# Patient Record
Sex: Female | Born: 1990
Health system: Southern US, Community
[De-identification: ages and names within clinical notes are randomized; demographics above are authoritative.]

## PROBLEM LIST (undated history)

## (undated) ENCOUNTER — Emergency Department (HOSPITAL_COMMUNITY): Admission: EM | Payer: 59 | Source: Home / Self Care

## (undated) ENCOUNTER — Encounter

## (undated) ENCOUNTER — Ambulatory Visit
Payer: BLUE CROSS/BLUE SHIELD | Attending: Student in an Organized Health Care Education/Training Program | Primary: Student in an Organized Health Care Education/Training Program

## (undated) ENCOUNTER — Ambulatory Visit: Payer: PRIVATE HEALTH INSURANCE

## (undated) ENCOUNTER — Ambulatory Visit

## (undated) ENCOUNTER — Encounter
Attending: Student in an Organized Health Care Education/Training Program | Primary: Student in an Organized Health Care Education/Training Program

## (undated) ENCOUNTER — Ambulatory Visit: Payer: BLUE CROSS/BLUE SHIELD

## (undated) ENCOUNTER — Telehealth

## (undated) ENCOUNTER — Ambulatory Visit: Payer: BLUE CROSS/BLUE SHIELD | Attending: Nurse Practitioner | Primary: Nurse Practitioner

## (undated) ENCOUNTER — Ambulatory Visit
Payer: PRIVATE HEALTH INSURANCE | Attending: Student in an Organized Health Care Education/Training Program | Primary: Student in an Organized Health Care Education/Training Program

## (undated) ENCOUNTER — Ambulatory Visit: Payer: PRIVATE HEALTH INSURANCE | Attending: Nurse Practitioner | Primary: Nurse Practitioner

## (undated) ENCOUNTER — Telehealth: Attending: Family Medicine | Primary: Family Medicine

## (undated) ENCOUNTER — Ambulatory Visit
Attending: Student in an Organized Health Care Education/Training Program | Primary: Student in an Organized Health Care Education/Training Program

## (undated) ENCOUNTER — Ambulatory Visit: Attending: Pharmacist | Primary: Pharmacist

## (undated) ENCOUNTER — Encounter: Attending: Nurse Practitioner | Primary: Nurse Practitioner

## (undated) DIAGNOSIS — R011 Cardiac murmur, unspecified: Secondary | ICD-10-CM

## (undated) DIAGNOSIS — J3501 Chronic tonsillitis: Principal | ICD-10-CM

## (undated) DIAGNOSIS — F1911 Other psychoactive substance abuse, in remission: Secondary | ICD-10-CM

## (undated) DIAGNOSIS — F319 Bipolar disorder, unspecified: Secondary | ICD-10-CM

## (undated) DIAGNOSIS — Z789 Other specified health status: Secondary | ICD-10-CM

## (undated) DIAGNOSIS — F909 Attention-deficit hyperactivity disorder, unspecified type: Secondary | ICD-10-CM

## (undated) DIAGNOSIS — R7303 Prediabetes: Secondary | ICD-10-CM

## (undated) DIAGNOSIS — Z8489 Family history of other specified conditions: Secondary | ICD-10-CM

## (undated) DIAGNOSIS — H9012 Conductive hearing loss, unilateral, left ear, with unrestricted hearing on the contralateral side: Secondary | ICD-10-CM

## (undated) DIAGNOSIS — L732 Hidradenitis suppurativa: Secondary | ICD-10-CM

## (undated) DIAGNOSIS — G43909 Migraine, unspecified, not intractable, without status migrainosus: Secondary | ICD-10-CM

## (undated) HISTORY — PX: TYMPANOPLASTY: SHX33

## (undated) HISTORY — PX: WISDOM TOOTH EXTRACTION: SHX21

## (undated) HISTORY — DX: Attention-deficit hyperactivity disorder, unspecified type: F90.9

## (undated) HISTORY — PX: AXILLARY HIDRADENITIS EXCISION: SUR522

## (undated) HISTORY — PX: TONSILLECTOMY: SUR1361

---
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
## 2012-02-11 ENCOUNTER — Ambulatory Visit (INDEPENDENT_AMBULATORY_CARE_PROVIDER_SITE_OTHER): Payer: 59 | Admitting: Family Medicine

## 2012-02-11 ENCOUNTER — Encounter: Payer: Self-pay | Admitting: Family Medicine

## 2012-02-11 VITALS — BP 104/80 | HR 80 | Temp 98.4°F | Ht 61.0 in | Wt 131.0 lb

## 2012-02-11 DIAGNOSIS — F909 Attention-deficit hyperactivity disorder, unspecified type: Secondary | ICD-10-CM | POA: Insufficient documentation

## 2012-02-11 DIAGNOSIS — Z136 Encounter for screening for cardiovascular disorders: Secondary | ICD-10-CM

## 2012-02-11 DIAGNOSIS — F901 Attention-deficit hyperactivity disorder, predominantly hyperactive type: Secondary | ICD-10-CM | POA: Insufficient documentation

## 2012-02-11 DIAGNOSIS — Z0000 Encounter for general adult medical examination without abnormal findings: Secondary | ICD-10-CM

## 2012-02-11 LAB — LIPID PANEL
Cholesterol: 172 mg/dL (ref 0–200)
LDL Cholesterol: 115 mg/dL — ABNORMAL HIGH (ref 0–99)
Total CHOL/HDL Ratio: 4

## 2012-02-11 LAB — COMPREHENSIVE METABOLIC PANEL
AST: 17 U/L (ref 0–37)
Albumin: 4.1 g/dL (ref 3.5–5.2)
Alkaline Phosphatase: 90 U/L (ref 39–117)
BUN: 9 mg/dL (ref 6–23)
Calcium: 9.4 mg/dL (ref 8.4–10.5)
Chloride: 105 mEq/L (ref 96–112)
Glucose, Bld: 95 mg/dL (ref 70–99)
Potassium: 4.1 mEq/L (ref 3.5–5.1)
Sodium: 139 mEq/L (ref 135–145)
Total Protein: 7.8 g/dL (ref 6.0–8.3)

## 2012-02-11 MED ORDER — METHYLPHENIDATE 30 MG/9HR TD PTCH: MEDICATED_PATCH | TRANSDERMAL | Status: DC

## 2012-02-11 NOTE — Progress Notes (Signed)
Subjective:    Patient ID: Courtney [name], female    DOB: 11/02/1990, 21 y.o.   MRN: [id number]  HPI  22 yo G0 here with her mother to establish care.  Sees Dr. Dareen Piano, OBGYN- received Depo injections through him.  She now does not typically have a period and is ok with this.  Not currently sexually active but has been having sex since she was 21 years old.  Uses condoms when she is sexually active.  ADHD- diagnosed at 21 years of age through formal psych evaluation.  Currently uses Daytrana patch twice daily.  Symptoms currently well controlled.  She is attending community college and living with her mother and grandmother.  Has no complaints today.  Patient Active Problem List  Diagnosis  . ANKLE SPRAIN, RIGHT  . ADHD (attention deficit hyperactivity disorder)  . Routine general medical examination at a health care facility   Past Medical History  Diagnosis Date  . ADHD (attention deficit hyperactivity disorder)     diagnosed with formal evaluation at 21 years of age  . Allergy    Past Surgical History  Procedure Date  . Bilateral tm reconstruction    History  Substance Use Topics  . Smoking status: Never Smoker   . Smokeless tobacco: Not on file  . Alcohol Use: Not on file   Family History  Problem Relation Age of Onset  . Hypertension Mother   . Hyperlipidemia Mother    No Known Allergies Current Outpatient Prescriptions on File Prior to Visit  Medication Sig Dispense Refill  . medroxyPROGESTERone (DEPO-PROVERA) 150 MG/ML injection Inject 150 mg into the muscle every 3 (three) months.      . methylphenidate (DAYTRANA) 30 MG/9HR Wear two patchs for 9 hours only each day  180 patch  0  . montelukast (SINGULAIR) 10 MG tablet Take 10 mg by mouth at bedtime.       The PMH, PSH, Social History, Family History, Medications, and allergies have been reviewed in [facility], and have been updated if relevant.    Review of Systems See HPI Patient reports no  vision/ hearing  changes,anorexia, weight change, fever ,adenopathy, persistant / recurrent hoarseness, swallowing issues, chest pain, edema,persistant / recurrent cough, hemoptysis, dyspnea(rest, exertional, paroxysmal nocturnal), gastrointestinal  bleeding (melena, rectal bleeding), abdominal pain, excessive heart burn, GU symptoms(dysuria, hematuria, pyuria, voiding/incontinence  Issues) syncope, focal weakness, severe memory loss, concerning skin lesions, depression, anxiety, abnormal bruising/bleeding, major joint swelling, breast masses or abnormal vaginal bleeding.       Objective:   Physical Exam BP 104/80  Pulse 80  Temp 98.4 F (36.9 C)  Ht 5\' 1"  (1.549 m)  Wt 131 lb (59.421 kg)  BMI 24.75 kg/m2  General:  Well-developed,well-nourished,in no acute distress; alert,appropriate and cooperative throughout examination Head:  normocephalic and atraumatic.   Eyes:  vision grossly intact, pupils equal, pupils round, and pupils reactive to light.   Ears:  R ear normal and L ear normal.   Nose:  no external deformity.   Mouth:  good dentition.   Neck:  No deformities, masses, or tenderness noted. Lungs:  Normal respiratory effort, chest expands symmetrically. Lungs are clear to auscultation, no crackles or wheezes. Heart:  Normal rate and regular rhythm. S1 and S2 normal without gallop, murmur, click, rub or other extra sounds. Abdomen:  Bowel sounds positive,abdomen soft and non-tender without masses, organomegaly or hernias noted. Msk:  No deformity or scoliosis noted of thoracic or lumbar spine.   Extremities:  No clubbing, cyanosis,  edema, or deformity noted with normal full range of motion of all joints.   Neurologic:  alert & oriented X3 and gait normal.   Skin:  Intact without suspicious lesions or rashes Bilateral ear piercing, naval piercing Cervical Nodes:  No lymphadenopathy noted Axillary Nodes:  No palpable lymphadenopathy Psych:  Cognition and judgment appear intact. Alert and cooperative  with normal attention span and concentration. No apparent delusions, illusions, hallucinations        Assessment & Plan:   1. ADHD (attention deficit hyperactivity disorder)  Stable on daytrana.  Refilled today- awaiting records.   2. Screening for ischemic heart disease  Lipid Panel  3. Routine general medical examination at a health care facility  Reviewed preventive care protocols, scheduled due services, and updated immunizations Discussed nutrition, exercise, diet, and healthy lifestyle.  Comprehensive metabolic panel

## 2012-02-11 NOTE — Patient Instructions (Addendum)
It was wonderful to meet you. Have a wonderful Christmas.  We will call you with your lab results.  Health Maintenance, Females A healthy lifestyle and preventative care can promote health and wellness.  Maintain regular health, dental, and eye exams.  Eat a healthy diet. Foods like vegetables, fruits, whole grains, low-fat dairy products, and lean protein foods contain the nutrients you need without too many calories. Decrease your intake of foods high in solid fats, added sugars, and salt. Get information about a proper diet from your caregiver, if necessary.  Regular physical exercise is one of the most important things you can do for your health. Most adults should get at least 150 minutes of moderate-intensity exercise (any activity that increases your heart rate and causes you to sweat) each week. In addition, most adults need muscle-strengthening exercises on 2 or more days a week.   Maintain a healthy weight. The body mass index (BMI) is a screening tool to identify possible weight problems. It provides an estimate of body fat based on height and weight. Your caregiver can help determine your BMI, and can help you achieve or maintain a healthy weight. For adults 20 years and older:  A BMI below 18.5 is considered underweight.  A BMI of 18.5 to 24.9 is normal.  A BMI of 25 to 29.9 is considered overweight.  A BMI of 30 and above is considered obese.  Maintain normal blood lipids and cholesterol by exercising and minimizing your intake of saturated fat. Eat a balanced diet with plenty of fruits and vegetables. Blood tests for lipids and cholesterol should begin at age 43 and be repeated every 5 years. If your lipid or cholesterol levels are high, you are over 50, or you are a high risk for heart disease, you may need your cholesterol levels checked more frequently.Ongoing high lipid and cholesterol levels should be treated with medicines if diet and exercise are not effective.  If  you smoke, find out from your caregiver how to quit. If you do not use tobacco, do not start.  If you are pregnant, do not drink alcohol. If you are breastfeeding, be very cautious about drinking alcohol. If you are not pregnant and choose to drink alcohol, do not exceed 1 drink per day. One drink is considered to be 12 ounces (355 mL) of beer, 5 ounces (148 mL) of wine, or 1.5 ounces (44 mL) of liquor.  Avoid use of street drugs. Do not share needles with anyone. Ask for help if you need support or instructions about stopping the use of drugs.  Diabetes screening involves taking a blood sample to check your fasting blood sugar level. This should be done once every 3 years, after age 46, if you are within normal weight and without risk factors for diabetes. Testing should be considered at a younger age or be carried out more frequently if you are overweight and have at least 1 risk factor for diabetes.  Breast cancer screening is essential preventative care for women. You should practice "breast self-awareness." This means understanding the normal appearance and feel of your breasts and may include breast self-examination. Any changes detected, no matter how small, should be reported to a caregiver. Women in their 6s and 30s should have a clinical breast exam (CBE) by a caregiver as part of a regular health exam every 1 to 3 years. After age 54, women should have a CBE every year. Starting at age 49, women should consider having a mammogram (breast X-ray)  every year. Women who have a family history of breast cancer should talk to their caregiver about genetic screening. Women at a high risk of breast cancer should talk to their caregiver about having an MRI and a mammogram every year.  The Pap test is a screening test for cervical cancer. Women should have a Pap test starting at age 12. Between ages 80 and 78, Pap tests should be repeated every 2 years. Beginning at age 68, you should have a Pap test  every 3 years as long as the past 3 Pap tests have been normal. If you had a hysterectomy for a problem that was not cancer or a condition that could lead to cancer, then you no longer need Pap tests. If you are between ages 8 and 37, and you have had normal Pap tests going back 10 years, you no longer need Pap tests. If you have had past treatment for cervical cancer or a condition that could lead to cancer, you need Pap tests and screening for cancer for at least 20 years after your treatment. If Pap tests have been discontinued, risk factors (such as a new sexual partner) need to be reassessed to determine if screening should be resumed. Some women have medical problems that increase the chance of getting cervical cancer. In these cases, your caregiver may recommend more frequent screening and Pap tests.  The human papillomavirus (HPV) test is an additional test that may be used for cervical cancer screening. The HPV test looks for the virus that can cause the cell changes on the cervix. The cells collected during the Pap test can be tested for HPV. The HPV test could be used to screen women aged 46 years and older, and should be used in women of any age who have unclear Pap test results. After the age of 67, women should have HPV testing at the same frequency as a Pap test.  Practice safe sex. Use condoms and avoid high-risk sexual practices to reduce the spread of sexually transmitted infections (STIs). Sexually active women aged 67 and younger should be checked for Chlamydia, which is a common sexually transmitted infection. Older women with new or multiple partners should also be tested for Chlamydia. Testing for other STIs is recommended if you are sexually active and at increased risk.  Use sunscreen with a sun protection factor (SPF) of 30 or greater. Apply sunscreen liberally and repeatedly throughout the day. You should seek shade when your shadow is shorter than you. Protect yourself by wearing  long sleeves, pants, a wide-brimmed hat, and sunglasses year round, whenever you are outdoors.  Notify your caregiver of new moles or changes in moles, especially if there is a change in shape or color. Also notify your caregiver if a mole is larger than the size of a pencil eraser.  Stay current with your immunizations. Document Released: 09/04/2010 Document Revised: 05/14/2011 Document Reviewed: 09/04/2010 [facility] Patient Information 2013 [place], [place].

## 2012-04-04 ENCOUNTER — Encounter: Payer: Self-pay | Admitting: Family Medicine

## 2012-04-04 ENCOUNTER — Ambulatory Visit (INDEPENDENT_AMBULATORY_CARE_PROVIDER_SITE_OTHER): Payer: 59 | Admitting: Family Medicine

## 2012-04-04 VITALS — BP 102/78 | HR 90 | Temp 97.8°F | Wt 134.0 lb

## 2012-04-04 DIAGNOSIS — J029 Acute pharyngitis, unspecified: Secondary | ICD-10-CM

## 2012-04-04 DIAGNOSIS — J069 Acute upper respiratory infection, unspecified: Secondary | ICD-10-CM | POA: Insufficient documentation

## 2012-04-04 MED ORDER — FLUTICASONE PROPIONATE 50 MCG/ACT NA SUSP: 2.0000 | Freq: Every day | NASAL | Status: DC

## 2012-04-04 NOTE — Progress Notes (Signed)
Sx started about 4 days ago.  Felt hot initially and didn't feel well.  Hoarse voice.  No vomiting, no nausea.  No diarrhea.  Is sneezing, stuffy, coughing.  R sided HA.  No ear pain. ST noted, variable intensity. Glands feel sore in neck.  No sputum.  Frequent rhinorrhea, clear.  Mild aches.  In school at [employer].   She is most bothered by the HA and nasal sx.    Meds, vitals, and allergies reviewed.   ROS: See HPI.  Otherwise, noncontributory.  GEN: nad, alert and oriented HEENT: mucous membranes moist, tm w/o erythema but chronic changes noted, nasal exam w/o erythema, clear discharge noted,  OP with cobblestoning NECK: supple w/o LA CV: rrr.   PULM: ctab, no inc wob EXT: no edema SKIN: no acute rash

## 2012-04-04 NOTE — Assessment & Plan Note (Signed)
Nontoxic, short duration.  Would use nasal saline and add on flonase.  Supportive tx o/w and f/u prn.  RST neg, likely viral.

## 2012-04-04 NOTE — Patient Instructions (Addendum)
Drink plenty of fluids, take tylenol as needed, and gargle with warm salt water for your throat.  Use nasal saline and then the flonase for nasal congestion.  This should gradually improve.  Take care.  Let us know if you have other concerns.

## 2012-05-20 ENCOUNTER — Encounter: Payer: Self-pay | Admitting: Family Medicine

## 2012-05-20 ENCOUNTER — Ambulatory Visit (INDEPENDENT_AMBULATORY_CARE_PROVIDER_SITE_OTHER): Payer: 59 | Admitting: Family Medicine

## 2012-05-20 VITALS — BP 116/78 | HR 96 | Temp 98.4°F

## 2012-05-20 DIAGNOSIS — R109 Unspecified abdominal pain: Secondary | ICD-10-CM

## 2012-05-20 LAB — POCT URINALYSIS DIPSTICK
Glucose, UA: NEGATIVE
Protein, UA: NEGATIVE
Urobilinogen, UA: 0.2

## 2012-05-20 LAB — POCT URINE PREGNANCY: Preg Test, Ur: NEGATIVE

## 2012-05-20 MED ORDER — NAPROXEN 500 MG PO TABS: ORAL_TABLET | ORAL | Status: DC

## 2012-05-20 NOTE — Progress Notes (Signed)
  Subjective:    Patient ID: Courtney [name], female    DOB: 1991/01/18, 22 y.o.   MRN: [id number]  HPI CC: R back pain  1d h/o sharp stabbing pain in R lateral abdomen.  Started yesterday morning.  Worse today.  Positional - laying down improves pain.  Bending over acutely worsens pain.  Felt cold yesterday.  Did eat fajita Sunday night - possibly more greasy than normal.  Denies inciting injury, no new workout routine. No new skin rashes. No fevers/chills, nausea/vomiting, diarrhea/constipation, dysuria, urgency, frequency, hematuria. No h/o prior pain like this. Appetite normal.  So far has tried tylenol which didn't help.  Also tried 2 tums, didn't help.  Both parents with kidney stones. Denies EtOH use. On depo shot.  Past Medical History  Diagnosis Date  . ADHD (attention deficit hyperactivity disorder)     diagnosed with formal evaluation at 22 years of age  . Allergy     Review of Systems Per HPI    Objective:   Physical Exam  Nursing note and vitals reviewed. Constitutional: She appears well-developed and well-nourished. No distress.  HENT:  Head: Normocephalic and atraumatic.  Mouth/Throat: Oropharynx is clear and moist. No oropharyngeal exudate.  Eyes: Conjunctivae and EOM are normal. Pupils are equal, round, and reactive to light. No scleral icterus.  Cardiovascular: Normal rate, regular rhythm, normal heart sounds and intact distal pulses.   No murmur heard. Pulmonary/Chest: Effort normal and breath sounds normal. No respiratory distress. She has no wheezes. She has no rales. She exhibits no tenderness.  Abdominal: Soft. Normal appearance and bowel sounds are normal. She exhibits no distension and no mass. There is no hepatosplenomegaly. There is tenderness in the right upper quadrant and right lower quadrant. There is guarding. There is no rigidity, no rebound, no CVA tenderness and negative Murphy's sign.  Neg psoas test  Musculoskeletal: She exhibits no edema.   Skin: Skin is warm and dry. No rash noted.  Psychiatric: She has a normal mood and affect.       Assessment & Plan:

## 2012-05-20 NOTE — Assessment & Plan Note (Addendum)
Anticipate gallbladder pathology given location of pain, ?gallstones. Not consistent with nephrolithiasis or appendicitis.  Today nontoxic. Check CBC, CMP today. UA today - sm LE and trace blood - micro WNL.  Regardless sent culture, but doubt urinary source of pain. Upreg neg. Schedule abd [place] to further eval gallbladder. Treat pain with naprosyn, rest. Red flags to seek urgent care or ER overnight discussed.

## 2012-05-20 NOTE — Patient Instructions (Signed)
I think this is more gallbladder issue - pass by Marion's office to schedule ultrasound. Urine checked today - normal. Blood work today as well. Start anti inflammatory for discomfort. If worsening pain at all, please seek urgent care or go to ER.

## 2012-05-21 ENCOUNTER — Ambulatory Visit: Payer: Self-pay | Admitting: Family Medicine

## 2012-05-21 ENCOUNTER — Encounter: Payer: Self-pay | Admitting: Family Medicine

## 2012-05-21 LAB — CBC WITH DIFFERENTIAL/PLATELET
Basophils Absolute: 0.2 10*3/uL — ABNORMAL HIGH (ref 0.0–0.1)
Eosinophils Absolute: 0.1 10*3/uL (ref 0.0–0.7)
Eosinophils Relative: 0.7 % (ref 0.0–5.0)
HCT: 42.7 % (ref 36.0–46.0)
Lymphs Abs: 2.1 10*3/uL (ref 0.7–4.0)
MCHC: 33.9 g/dL (ref 30.0–36.0)
MCV: 91.9 fl (ref 78.0–100.0)
Monocytes Absolute: 0.4 10*3/uL (ref 0.1–1.0)
Platelets: 303 10*3/uL (ref 150.0–400.0)
RDW: 13.2 % (ref 11.5–14.6)

## 2012-05-21 LAB — COMPREHENSIVE METABOLIC PANEL
ALT: 16 U/L (ref 0–35)
AST: 21 U/L (ref 0–37)
Albumin: 4.4 g/dL (ref 3.5–5.2)
Alkaline Phosphatase: 88 U/L (ref 39–117)
Potassium: 4.2 mEq/L (ref 3.5–5.1)
Sodium: 138 mEq/L (ref 135–145)
Total Bilirubin: 0.8 mg/dL (ref 0.3–1.2)
Total Protein: 8.2 g/dL (ref 6.0–8.3)

## 2012-05-22 ENCOUNTER — Other Ambulatory Visit: Payer: Self-pay | Admitting: Family Medicine

## 2012-05-22 MED ORDER — METHYLPHENIDATE 30 MG/9HR TD PTCH: MEDICATED_PATCH | TRANSDERMAL | Status: DC

## 2012-08-18 ENCOUNTER — Ambulatory Visit: Payer: 59 | Admitting: Family Medicine

## 2012-08-21 ENCOUNTER — Ambulatory Visit (INDEPENDENT_AMBULATORY_CARE_PROVIDER_SITE_OTHER): Payer: 59 | Admitting: Family Medicine

## 2012-08-21 ENCOUNTER — Encounter: Payer: Self-pay | Admitting: Family Medicine

## 2012-08-21 VITALS — BP 108/78 | HR 72 | Temp 98.2°F | Wt 147.0 lb

## 2012-08-21 DIAGNOSIS — J3489 Other specified disorders of nose and nasal sinuses: Secondary | ICD-10-CM

## 2012-08-21 MED ORDER — METHYLPHENIDATE 30 MG/9HR TD PTCH: MEDICATED_PATCH | TRANSDERMAL | Status: DC

## 2012-08-21 MED ORDER — AMOXICILLIN-POT CLAVULANATE 875-125 MG PO TABS: 1.0000 | ORAL_TABLET | Freq: Two times a day (BID) | ORAL | Status: DC

## 2012-08-21 NOTE — Progress Notes (Signed)
  Subjective:    Patient ID: Courtney [name], female    DOB: January 09, 1991, 22 y.o.   MRN: [id number]  HPI  Very pleasant 22 yo here with sore area in left nostril for 5 days.  Becoming more painful and red.  No fevers or chills. No n/v/d.  Patient Active Problem List   Diagnosis Date Noted  . ADHD (attention deficit hyperactivity disorder) 02/11/2012   Past Medical History  Diagnosis Date  . ADHD (attention deficit hyperactivity disorder)     diagnosed with formal evaluation at 22 years of age  . Allergy    Past Surgical History  Procedure Laterality Date  . Bilateral tm reconstruction     History  Substance Use Topics  . Smoking status: Never Smoker   . Smokeless tobacco: Not on file  . Alcohol Use: No   Family History  Problem Relation Age of Onset  . Hypertension Mother   . Hyperlipidemia Mother    No Known Allergies Current Outpatient Prescriptions on File Prior to Visit  Medication Sig Dispense Refill  . fluticasone (FLONASE) 50 MCG/ACT nasal spray Place 2 sprays into the nose daily.  16 g  1  . medroxyPROGESTERone (DEPO-PROVERA) 150 MG/ML injection Inject 150 mg into the muscle every 3 (three) months.      . montelukast (SINGULAIR) 10 MG tablet Take 10 mg by mouth at bedtime.       No current facility-administered medications on file prior to visit.   The PMH, PSH, Social History, Family History, Medications, and allergies have been reviewed in [facility], and have been updated if relevant.   Review of Systems    See HPI No HA No epistaxis Objective:   Physical Exam  Constitutional: She appears well-developed and well-nourished. No distress.  HENT:  Head: Normocephalic.  Nose:    Psychiatric: She has a normal mood and affect. Her speech is normal and behavior is normal. Judgment and thought content normal. Cognition and memory are normal.   BP 108/78  Pulse 72  Temp(Src) 98.2 F (36.8 C)  Wt 147 lb (66.679 kg)  BMI 27.79 kg/m2        Assessment &  Plan:  1. Internal nasal lesion Now with indications of infection. Will treat with Augmentin twice daily x 10 days. Continue warm compresses. Call or return to clinic prn if these symptoms worsen or fail to improve as anticipated. The patient indicates understanding of these issues and agrees with the plan.

## 2012-08-21 NOTE — Patient Instructions (Addendum)
Good to see you. Please take Augmentin as directed- 1 tablet twice daily x 10 days.  Please talk to your about seeing a therapist.

## 2012-08-22 ENCOUNTER — Encounter: Payer: Self-pay | Admitting: Family Medicine

## 2012-10-01 ENCOUNTER — Ambulatory Visit (INDEPENDENT_AMBULATORY_CARE_PROVIDER_SITE_OTHER): Payer: 59 | Admitting: Family Medicine

## 2012-10-01 ENCOUNTER — Encounter: Payer: Self-pay | Admitting: Family Medicine

## 2012-10-01 VITALS — BP 118/76 | HR 88 | Temp 99.2°F | Ht 61.0 in | Wt 147.0 lb

## 2012-10-01 DIAGNOSIS — L559 Sunburn, unspecified: Secondary | ICD-10-CM

## 2012-10-01 NOTE — Patient Instructions (Signed)
Sunburn  Sunburn is damage to the skin caused by overexposure to ultraviolet (UV) rays. People with light skin or a fair complexion may be more susceptible to sunburn. Repeated sun exposure causes early skin aging such as wrinkles and sun spots. It also increases the risk of skin cancer.  CAUSES  A sunburn is caused by getting too much UV radiation from the sun.  SYMPTOMS   Red or pink skin.   Soreness and swelling.   Pain.   Blisters.   Peeling skin.   Headache, fever, and fatigue if sunburn covers a large area.  TREATMENT   Your caregiver may tell you to take certain medicines to lessen inflammation.   Your caregiver may have you use hydrocortisone cream or spray to help with itching and inflammation.   Your caregiver may prescribe an antibiotic cream to use on blisters.  HOME CARE INSTRUCTIONS    Avoid further exposure to the sun.   Cool baths and cool compresses may be helpful if used several times per day. Do not apply ice, since this may result in more damage to the skin.   Only take over-the-counter or prescription medicines for pain, discomfort, or fever as directed by your caregiver.   Use aloe or other over-the-counter sunburn creams or gels on your skin. Do not apply these creams or gels on blisters.   Drink enough fluids to keep your urine clear or pale yellow.   Do not break blisters. If blisters break, your caregiver may recommend an antibiotic cream to apply to the affected area.  PREVENTION    Try to avoid the sun between 10:00 a.m. and 4:00 p.m. when it is the strongest.   Use a sunscreen or sunblock with SPF 30 or greater.   Apply sunscreen at least 30 minutes before exposure to the sun.   Always wear protective hats, clothing, and sunglasses with UV protection.   Avoid medicines, herbs, and foods that increase your sensitivity to sunlight.   Avoid tanning beds.  SEEK IMMEDIATE MEDICAL CARE IF:    You have a fever.   Your pain is uncontrolled with medicine.   You start to  vomit or have diarrhea.   You feel faint or develop a headache with confusion.   You develop severe blistering.   You have a pus-like (purulent) discharge coming from the blisters.   Your burn becomes more painful and swollen.  MAKE SURE YOU:   Understand these instructions.   Will watch your condition.   Will get help right away if you are not doing well or get worse.  Document Released: 11/29/2004 Document Revised: 05/14/2011 Document Reviewed: 08/13/2010  [employer] Patient Information 2014 [employer].

## 2012-10-01 NOTE — Progress Notes (Signed)
  Subjective:    Patient ID: Courtney [name], female    DOB: 1990-12-17, 22 y.o.   MRN: [id number]  HPI CC: sunburn.  Monday went to outdoor concern without sunscreen.  All day exposed to sun.  Bad sunburn on shoulders, face and neck.  Yesterday morning blister started.  Has been using tylenol for pain  Has been using lotion and aloe on sunburn.  Feels tired. No nausea, fevers.  Past Medical History  Diagnosis Date  . ADHD (attention deficit hyperactivity disorder)     diagnosed with formal evaluation at 22 years of age  . Allergy      Review of Systems Per HPI    Objective:   Physical Exam  Nursing note and vitals reviewed. Constitutional: She appears well-developed and well-nourished. No distress.  Skin: There is erythema.  Erythematous sunburn on both shoulders, and around neck posterior and laterally Large serous fluid filled blister on right shoulder, smaller one on R neck.       Assessment & Plan:  Area cleaned with alcohol and then 18 g needle and 10cc syringe used to drain about 2cc of serosanguinous fluid.  Pt feels better after large blister was drained.  Smaller blister left intact.  Wound care instructions provided.

## 2012-10-01 NOTE — Assessment & Plan Note (Signed)
Large blister drained. Sunburn care discussed. recommended aloe gel Discussed red flags to monitor for infection. No evidence of sun poisoning.

## 2012-11-27 ENCOUNTER — Other Ambulatory Visit: Payer: Self-pay

## 2012-11-27 MED ORDER — METHYLPHENIDATE 30 MG/9HR TD PTCH: MEDICATED_PATCH | TRANSDERMAL | Status: DC

## 2012-11-27 NOTE — Telephone Encounter (Signed)
Adela Lank notified Rx is ready to be picked up at front desk.

## 2012-11-27 NOTE — Telephone Encounter (Signed)
Courtney [name] pts mother left v/m requesting rx Daytrana; request to pick up 11/28/12 in AM. Courtney [name] request cb when ready for pick up.(Dr Dayton Martes not in office until 11/28/12 PM).

## 2012-12-29 ENCOUNTER — Telehealth: Payer: Self-pay | Admitting: *Deleted

## 2012-12-29 NOTE — Telephone Encounter (Signed)
Requested prior auth from [employer] for Daytrana. Med was approved from 11/20/2012-12/20/2015. Pharmacy notified of approval via fax. Prior auth paperwork will be sent to scan into pt's medical record.

## 2013-01-08 ENCOUNTER — Other Ambulatory Visit: Payer: Self-pay

## 2013-03-12 ENCOUNTER — Other Ambulatory Visit: Payer: Self-pay

## 2013-03-12 MED ORDER — METHYLPHENIDATE 30 MG/9HR TD PTCH: MEDICATED_PATCH | TRANSDERMAL | Status: DC

## 2013-03-12 NOTE — Telephone Encounter (Signed)
pts mother left v/m requesting rx Daytrana. Call when ready for pick up.

## 2013-03-13 NOTE — Telephone Encounter (Signed)
Durenda Ageristan [facility] [address] left [facility] and [facility] cannot [facility]. Pt must bring prescription to pharmacy.

## 2013-03-13 NOTE — Telephone Encounter (Signed)
Spoke to pt and informed her Rx has been printed and is available for pickup at the front desk;pt informed govt issued photo id required for pickup

## 2013-06-18 ENCOUNTER — Other Ambulatory Visit: Payer: Self-pay

## 2013-06-18 MED ORDER — METHYLPHENIDATE 30 MG/9HR TD PTCH: MEDICATED_PATCH | TRANSDERMAL | Status: DC

## 2013-06-18 NOTE — Telephone Encounter (Signed)
pts mother left v/m requesting printed rx for Daytrana. Call when ready for pick up.

## 2013-06-18 NOTE — Telephone Encounter (Signed)
Lm on pts vm informing her Rx is available at the front desk for pickup;informed a gov't issued photo id required for pickup 

## 2013-06-24 ENCOUNTER — Encounter: Payer: Self-pay | Admitting: Radiology

## 2013-07-28 ENCOUNTER — Ambulatory Visit (INDEPENDENT_AMBULATORY_CARE_PROVIDER_SITE_OTHER): Payer: 59 | Admitting: Family Medicine

## 2013-07-28 VITALS — BP 116/60 | HR 76 | Temp 98.1°F | Ht 60.5 in | Wt 157.8 lb

## 2013-07-28 DIAGNOSIS — J020 Streptococcal pharyngitis: Secondary | ICD-10-CM | POA: Insufficient documentation

## 2013-07-28 NOTE — Progress Notes (Signed)
   Subjective:   Patient ID: Courtney [name], female    DOB: 07-19-1990, 23 y.o.   MRN: [id number]  Courtney [name] is a pleasant 23 y.o. year old female who presents to clinic today with Follow-up and Sore Throat  on 07/28/2013  HPI: Urgent care follow up- [facility] because of sore throat, hemoptysis. + rapid strep Placed on Amoxicillin 875 mg twice daily x 10 days. Not PCN allergic. Has had strep throat 3 times in past year. Would like to discuss tonsillectomy.  Feels throat is feeling better. Hemoptysis has resolved.  Per pt, always have hemoptysis when she has strep throat.  No fevers or chills.  Patient Active Problem List   Diagnosis Date Noted  . Strep pharyngitis 07/28/2013  . Sunburn 10/01/2012  . Internal nasal lesion 08/21/2012  . ADHD (attention deficit hyperactivity disorder) 02/11/2012   Past Medical History  Diagnosis Date  . ADHD (attention deficit hyperactivity disorder)     diagnosed with formal evaluation at 23 years of age  . Allergy    Past Surgical History  Procedure Laterality Date  . Bilateral tm reconstruction     History  Substance Use Topics  . Smoking status: Never Smoker   . Smokeless tobacco: Never Used  . Alcohol Use: No   Family History  Problem Relation Age of Onset  . Hypertension Mother   . Hyperlipidemia Mother    No Known Allergies Current Outpatient Prescriptions on File Prior to Visit  Medication Sig Dispense Refill  . fluticasone (FLONASE) 50 MCG/ACT nasal spray Place 2 sprays into the nose daily as needed.      . medroxyPROGESTERone (DEPO-PROVERA) 150 MG/ML injection Inject 150 mg into the muscle every 3 (three) months.      . methylphenidate (DAYTRANA) 30 MG/9HR Wear two patchs for 9 hours only each day  180 patch  0  . montelukast (SINGULAIR) 10 MG tablet Take 10 mg by mouth at bedtime.       No current facility-administered medications on file prior to visit.   The PMH, PSH, Social History, Family  History, Medications, and allergies have been reviewed in [facility], and have been updated if relevant.   Review of Systems No nausea or vomiting. No rashes No diarrhea    Objective:    BP 116/60  Pulse 76  Temp(Src) 98.1 F (36.7 C) (Oral)  Ht 5' 0.5" (1.537 m)  Wt 157 lb 12 oz (71.555 kg)  BMI 30.29 kg/m2  SpO2 98%   Physical Exam  Nursing note and vitals reviewed. Constitutional: She appears well-developed and well-nourished. No distress.  HENT:  Head: Normocephalic and atraumatic.  Mouth/Throat: Uvula is midline and mucous membranes are normal. Posterior oropharyngeal erythema present. No oropharyngeal exudate, posterior oropharyngeal edema or tonsillar abscesses.  Eyes: Conjunctivae are normal. Pupils are equal, round, and reactive to light.  Neck: Normal range of motion. Neck supple. No tracheal deviation present. No thyromegaly present.  Lymphadenopathy:    She has no cervical adenopathy.  Skin: Skin is warm, dry and intact.  Psychiatric: She has a normal mood and affect. Her speech is normal and behavior is normal. Judgment and thought content normal. Cognition and memory are normal.          Assessment & Plan:   Strep pharyngitis No Follow-up on file.

## 2013-07-28 NOTE — Patient Instructions (Signed)
Good to see you. Please stop by to see Shirlee Limerick on your way out to set up your ENT referral.

## 2013-07-28 NOTE — Assessment & Plan Note (Signed)
Recurrent. Discussed options- she would like to consider tonsillectomy.  She would like to see Dr. Dorma Russell.  Referral placed.

## 2013-07-28 NOTE — Progress Notes (Signed)
Pre visit review using our clinic review tool, if applicable. No additional management support is needed unless otherwise documented below in the visit note. 

## 2013-07-29 ENCOUNTER — Encounter: Payer: Self-pay | Admitting: Family Medicine

## 2013-08-03 DIAGNOSIS — J3501 Chronic tonsillitis: Secondary | ICD-10-CM

## 2013-08-03 HISTORY — DX: Chronic tonsillitis: J35.01

## 2013-09-01 ENCOUNTER — Ambulatory Visit: Payer: 59 | Admitting: Family Medicine

## 2013-09-01 ENCOUNTER — Encounter (HOSPITAL_BASED_OUTPATIENT_CLINIC_OR_DEPARTMENT_OTHER): Payer: Self-pay | Admitting: *Deleted

## 2013-09-09 NOTE — H&P (Signed)
Assessment  Recurrent streptococcal tonsillitis (034.0) (J03.00). Discussed  Recurring streptococcal tonsillitis, 2-3 times annually for the past 10 years or so. This meets the criteria for tonsillectomy. Recommend proceed with tonsillectomy. Risks and benefits were discussed in detail. A handout was provided. We will schedule at their convenience. Reason For Visit  Courtney [name] is here today at the kind request of Ruthe Mannanron, Talia for consultation and opinion for sore throat. HPI  Recurring streptococcal tonsillitis. Last episode was last week. She occasionally has strep negative tonsillitis. She has 2-3 episodes each year since middle school. She is currently in college but offer the summer time. Allergies  No Known Drug Allergies. Current Meds  Singulair TABS (Montelukast Sodium);; RPT Amoxicillin 875 MG Oral Tablet;; RPT Flonase 50 MCG/ACT Nasal Suspension (Fluticasone Propionate);; RPT MedroxyPROGESTERone Acetate 150 MG/ML Intramuscular Suspension;; RPT Daytrana 30 MG/9HR Transdermal Patch;; RPT. PSH  Ear Surgery. Family Hx  Family history of Alzheimer's disease: Grandmother (V17.2) (Z82.0) Family history of diabetes mellitus: Grandmother (V18.0) (Z83.3) Seasonal allergies: Mother (J30.2). Personal Hx  Caffeine use (V49.89) (Z78.9); 5 cups day Never smoker No alcohol use Non-smoker (V49.89) (Z78.9). ROS  Systemic: Feeling tired (fatigue).  No fever, no night sweats, and no recent weight loss. Head: No headache. Eyes: No eye symptoms. Otolaryngeal: No hearing loss, no earache, no tinnitus, and no purulent nasal discharge.  No nasal passage blockage (stuffiness).  Snoring, sneezing, hoarseness, and sore throat. Cardiovascular: No chest pain or discomfort  and no palpitations. Pulmonary: No dyspnea, no cough, and no wheezing. Gastrointestinal: Dysphagia.  No heartburn.  No nausea, no abdominal pain, and no melena.  No diarrhea. Genitourinary: No dysuria. Endocrine: No muscle  weakness. Musculoskeletal: No calf muscle cramps, no arthralgias, and no soft tissue swelling. Neurological: No dizziness, no fainting, no tingling, and no numbness. Psychological: No anxiety  and no depression. Skin: No rash. 12 system ROS was obtained and reviewed on the Health Maintenance form dated today.  Positive responses are shown above.  If the symptom is not checked, the patient has denied it. Vital Signs   Recorded by [facility] LLC[facility]olimowski,Sharon on 28 Jul 2013 01:20 PM BP:110/62,  Height: 5 ft 0.5 in, Weight: 157 lb 12 oz, BMI: 30.3 kg/m2,  BMI Calculated: 30.30 ,  BSA Calculated: 1.70. Physical Exam  APPEARANCE: Well developed, heavyset young lady, in no acute distress.  Normal affect, in a pleasant mood.  Oriented to time, place and person. COMMUNICATION: Normal voice   HEAD & FACE:  No scars, lesions or masses of head and face.  Sinuses nontender to palpation.  Salivary glands without mass or tenderness.  Facial strength symmetric.  No facial lesion, scars, or mass. EYES: EOMI with normal primary gaze alignment. Visual acuity grossly intact.  PERRLA EXTERNAL EAR & NOSE: No scars, lesions or masses  EAC & TYMPANIC MEMBRANE:  EAC shows no obstructing lesions or debris and tympanic membranes are normal bilaterally with good movement to insufflation. GROSS HEARING: Normal   TMJ:  Nontender  INTRANASAL EXAM: No polyps or purulence.  NASOPHARYNX: Normal, without lesions. LIPS, TEETH & GUMS: No lip lesions, normal dentition and normal gums. ORAL CAVITY/OROPHARYNX:  Oral mucosa moist without lesion or asymmetry of the palate, tongue, tonsil or posterior pharynx. Tonsils were 2+ enlarged but clear and healthy appearing today. NECK:  Supple without adenopathy or mass. THYROID:  Normal with no masses palpable.  NEUROLOGIC:  No gross CN deficits. No nystagmus noted.   LYMPHATIC:  No enlarged nodes palpable. Signature  Electronically signed by : Enrigue CatenaJefry  Mohogany Toppins  M.D.; 07/28/2013 1:45 PM EST.

## 2013-09-14 ENCOUNTER — Encounter (HOSPITAL_BASED_OUTPATIENT_CLINIC_OR_DEPARTMENT_OTHER): Payer: Self-pay | Admitting: *Deleted

## 2013-09-14 ENCOUNTER — Encounter (HOSPITAL_BASED_OUTPATIENT_CLINIC_OR_DEPARTMENT_OTHER)
Admission: RE | Disposition: A | Discharge status: Home / Self Care | Payer: Self-pay | Source: Ambulatory Visit | Attending: Otolaryngology

## 2013-09-14 ENCOUNTER — Ambulatory Visit (HOSPITAL_BASED_OUTPATIENT_CLINIC_OR_DEPARTMENT_OTHER): Payer: 59 | Admitting: Anesthesiology

## 2013-09-14 ENCOUNTER — Encounter (HOSPITAL_BASED_OUTPATIENT_CLINIC_OR_DEPARTMENT_OTHER): Payer: 59 | Admitting: Anesthesiology

## 2013-09-14 ENCOUNTER — Ambulatory Visit (HOSPITAL_BASED_OUTPATIENT_CLINIC_OR_DEPARTMENT_OTHER)
Admission: RE | Admit: 2013-09-14 | Discharge: 2013-09-14 | Disposition: A | Discharge status: Home / Self Care | Payer: 59 | Source: Ambulatory Visit | Attending: Otolaryngology | Admitting: Otolaryngology

## 2013-09-14 DIAGNOSIS — Z792 Long term (current) use of antibiotics: Secondary | ICD-10-CM | POA: Insufficient documentation

## 2013-09-14 DIAGNOSIS — J3501 Chronic tonsillitis: Secondary | ICD-10-CM | POA: Insufficient documentation

## 2013-09-14 DIAGNOSIS — J020 Streptococcal pharyngitis: Secondary | ICD-10-CM

## 2013-09-14 DIAGNOSIS — Z9089 Acquired absence of other organs: Secondary | ICD-10-CM

## 2013-09-14 HISTORY — DX: Chronic tonsillitis: J35.01

## 2013-09-14 HISTORY — PX: TONSILLECTOMY: SHX5217

## 2013-09-14 SURGERY — TONSILLECTOMY
Anesthesia: General | Laterality: Bilateral

## 2013-09-14 MED ORDER — HYDROMORPHONE HCL PF 1 MG/ML IJ SOLN
0.2500 mg | INTRAMUSCULAR | Status: DC | PRN
  Administered 2013-09-14: 0.5 mg via INTRAVENOUS

## 2013-09-14 MED ORDER — MEDROXYPROGESTERONE ACETATE 150 MG/ML IM SUSP: 150.0000 mg | INTRAMUSCULAR | Status: DC

## 2013-09-14 MED ORDER — BACITRACIN-NEOMYCIN-POLYMYXIN 400-5-5000 EX OINT
TOPICAL_OINTMENT | CUTANEOUS | Status: AC
  Filled 2013-09-14: qty 1

## 2013-09-14 MED ORDER — LACTATED RINGERS IV SOLN
INTRAVENOUS | Status: DC
  Administered 2013-09-14 (×2): via INTRAVENOUS

## 2013-09-14 MED ORDER — PHENOL 1.4 % MT LIQD: 1.0000 | OROMUCOSAL | Status: DC | PRN

## 2013-09-14 MED ORDER — MIDAZOLAM HCL 2 MG/2ML IJ SOLN
INTRAMUSCULAR | Status: AC
  Filled 2013-09-14: qty 2

## 2013-09-14 MED ORDER — OXYCODONE HCL 5 MG/5ML PO SOLN
5.0000 mg | Freq: Once | ORAL | Status: DC | PRN
Start: 2013-09-14 — End: 2013-09-14

## 2013-09-14 MED ORDER — HYDROCODONE-ACETAMINOPHEN 7.5-325 MG/15ML PO SOLN
10.0000 mL | ORAL | Status: DC | PRN
  Administered 2013-09-14 (×2): 15 mL via ORAL
  Filled 2013-09-14 (×2): qty 15

## 2013-09-14 MED ORDER — DEXAMETHASONE SODIUM PHOSPHATE 10 MG/ML IJ SOLN: 10.0000 mg | INTRAMUSCULAR | Status: DC

## 2013-09-14 MED ORDER — OXYCODONE HCL 5 MG PO TABS: 5.0000 mg | ORAL_TABLET | Freq: Once | ORAL | Status: DC | PRN

## 2013-09-14 MED ORDER — IBUPROFEN 100 MG/5ML PO SUSP: 400.0000 mg | Freq: Four times a day (QID) | ORAL | Status: DC | PRN

## 2013-09-14 MED ORDER — SUCCINYLCHOLINE CHLORIDE 20 MG/ML IJ SOLN
INTRAMUSCULAR | Status: DC | PRN
  Administered 2013-09-14: 50 mg via INTRAVENOUS

## 2013-09-14 MED ORDER — DEXAMETHASONE SODIUM PHOSPHATE 4 MG/ML IJ SOLN
INTRAMUSCULAR | Status: DC | PRN
  Administered 2013-09-14: 10 mg via INTRAVENOUS

## 2013-09-14 MED ORDER — HYDROMORPHONE HCL PF 1 MG/ML IJ SOLN
INTRAMUSCULAR | Status: AC
  Filled 2013-09-14: qty 1

## 2013-09-14 MED ORDER — MIDAZOLAM HCL 5 MG/5ML IJ SOLN
INTRAMUSCULAR | Status: DC | PRN
  Administered 2013-09-14: 2 mg via INTRAVENOUS

## 2013-09-14 MED ORDER — MIDAZOLAM HCL 2 MG/2ML IJ SOLN: 1.0000 mg | INTRAMUSCULAR | Status: DC | PRN

## 2013-09-14 MED ORDER — ONDANSETRON HCL 4 MG/2ML IJ SOLN: 4.0000 mg | Freq: Once | INTRAMUSCULAR | Status: DC | PRN

## 2013-09-14 MED ORDER — HYDROCODONE-ACETAMINOPHEN 7.5-325 MG/15ML PO SOLN: 15.0000 mL | Freq: Four times a day (QID) | ORAL | Status: DC | PRN

## 2013-09-14 MED ORDER — FENTANYL CITRATE 0.05 MG/ML IJ SOLN
INTRAMUSCULAR | Status: AC
  Filled 2013-09-14: qty 6

## 2013-09-14 MED ORDER — FENTANYL CITRATE 0.05 MG/ML IJ SOLN: 50.0000 ug | INTRAMUSCULAR | Status: DC | PRN

## 2013-09-14 MED ORDER — PROMETHAZINE HCL 25 MG RE SUPP: 25.0000 mg | Freq: Four times a day (QID) | RECTAL | Status: DC | PRN

## 2013-09-14 MED ORDER — DEXTROSE-NACL 5-0.9 % IV SOLN
INTRAVENOUS | Status: DC
  Administered 2013-09-14: 10:00:00 via INTRAVENOUS

## 2013-09-14 MED ORDER — ONDANSETRON HCL 4 MG/2ML IJ SOLN
INTRAMUSCULAR | Status: DC | PRN
  Administered 2013-09-14: 4 mg via INTRAVENOUS

## 2013-09-14 MED ORDER — MIDAZOLAM HCL 2 MG/ML PO SYRP: 12.0000 mg | ORAL_SOLUTION | Freq: Once | ORAL | Status: DC | PRN

## 2013-09-14 MED ORDER — MONTELUKAST SODIUM 10 MG PO TABS: 10.0000 mg | ORAL_TABLET | Freq: Every day | ORAL | Status: DC

## 2013-09-14 MED ORDER — PROPOFOL 10 MG/ML IV BOLUS
INTRAVENOUS | Status: DC | PRN
  Administered 2013-09-14: 200 mg via INTRAVENOUS

## 2013-09-14 MED ORDER — FENTANYL CITRATE 0.05 MG/ML IJ SOLN
INTRAMUSCULAR | Status: DC | PRN
  Administered 2013-09-14 (×3): 50 ug via INTRAVENOUS

## 2013-09-14 MED ORDER — LIDOCAINE HCL (CARDIAC) 20 MG/ML IV SOLN
INTRAVENOUS | Status: DC | PRN
  Administered 2013-09-14: 50 mg via INTRAVENOUS

## 2013-09-14 SURGICAL SUPPLY — 29 items
CANISTER SUCT 1200ML W/VALVE (MISCELLANEOUS) ×3 IMPLANT
CATH ROBINSON RED A/P 12FR (CATHETERS) ×3 IMPLANT
COAGULATOR SUCT 6 FR SWTCH (ELECTROSURGICAL) ×1
COAGULATOR SUCT SWTCH 10FR 6 (ELECTROSURGICAL) ×2 IMPLANT
COVER MAYO STAND STRL (DRAPES) ×3 IMPLANT
ELECT COATED BLADE 2.86 ST (ELECTRODE) ×3 IMPLANT
ELECT REM PT RETURN 9FT ADLT (ELECTROSURGICAL)
ELECT REM PT RETURN 9FT PED (ELECTROSURGICAL)
ELECTRODE REM PT RETRN 9FT PED (ELECTROSURGICAL) IMPLANT
ELECTRODE REM PT RTRN 9FT ADLT (ELECTROSURGICAL) IMPLANT
GLOVE BIOGEL M 7.0 STRL (GLOVE) ×3 IMPLANT
GLOVE ECLIPSE 6.5 STRL STRAW (GLOVE) ×3 IMPLANT
GLOVE ECLIPSE 7.5 STRL STRAW (GLOVE) ×3 IMPLANT
GOWN STRL REUS W/ TWL LRG LVL3 (GOWN DISPOSABLE) ×2 IMPLANT
GOWN STRL REUS W/TWL LRG LVL3 (GOWN DISPOSABLE) ×4
MARKER SKIN DUAL TIP RULER LAB (MISCELLANEOUS) IMPLANT
NS IRRIG 1000ML POUR BTL (IV SOLUTION) ×3 IMPLANT
PENCIL FOOT CONTROL (ELECTRODE) ×3 IMPLANT
SHEET MEDIUM DRAPE 40X70 STRL (DRAPES) ×3 IMPLANT
SOLUTION BUTLER CLEAR DIP (MISCELLANEOUS) IMPLANT
SPONGE GAUZE 4X4 12PLY STER LF (GAUZE/BANDAGES/DRESSINGS) ×3 IMPLANT
SPONGE TONSIL 1 RF SGL (DISPOSABLE) IMPLANT
SPONGE TONSIL 1.25 RF SGL STRG (GAUZE/BANDAGES/DRESSINGS) IMPLANT
SYR BULB 3OZ (MISCELLANEOUS) ×3 IMPLANT
TOWEL OR 17X24 6PK STRL BLUE (TOWEL DISPOSABLE) ×3 IMPLANT
TUBE CONNECTING 20'X1/4 (TUBING) ×1
TUBE CONNECTING 20X1/4 (TUBING) ×2 IMPLANT
TUBE SALEM SUMP 12R W/ARV (TUBING) IMPLANT
TUBE SALEM SUMP 16 FR W/ARV (TUBING) IMPLANT

## 2013-09-14 NOTE — Anesthesia Preprocedure Evaluation (Signed)
Anesthesia Evaluation  Patient identified by MRN, date of birth, ID band Patient awake    Reviewed: Allergy & Precautions, H&P , NPO status , Patient's Chart, lab work & pertinent test results  Airway Mallampati: I TM Distance: >3 FB Neck ROM: Full    Dental  (+) Teeth Intact, Dental Advisory Given   Pulmonary  breath sounds clear to auscultation        Cardiovascular Rhythm:Regular Rate:Normal     Neuro/Psych    GI/Hepatic   Endo/Other    Renal/GU      Musculoskeletal   Abdominal   Peds  Hematology   Anesthesia Other Findings   Reproductive/Obstetrics                           Anesthesia Physical Anesthesia Plan  ASA: II  Anesthesia Plan: General   Post-op Pain Management:    Induction: Intravenous  Airway Management Planned: Oral ETT  Additional Equipment:   Intra-op Plan:   Post-operative Plan: Extubation in OR  Informed Consent: I have reviewed the patients History and Physical, chart, labs and discussed the procedure including the risks, benefits and alternatives for the proposed anesthesia with the patient or authorized representative who has indicated his/her understanding and acceptance.   Dental advisory given  Plan Discussed with: CRNA, Anesthesiologist and Surgeon  Anesthesia Plan Comments:        Anesthesia Quick Evaluation  

## 2013-09-14 NOTE — Transfer of Care (Signed)
Immediate Anesthesia Transfer of Care Note  Patient: Courtney ApleyBrandy L [name]  Procedure(s) Performed: Procedure(s): BILATERAL TONSILLECTOMY (Bilateral)  Patient Location: PACU  Anesthesia Type:General  Level of Consciousness: awake, alert , oriented and patient cooperative  Airway & Oxygen Therapy: Patient Spontanous Breathing, Patient connected to face mask and aerosol face mask  Post-op Assessment: Report given to PACU RN and Post -op Vital signs reviewed and stable  Post vital signs: Reviewed and stable  Complications: No apparent anesthesia complications

## 2013-09-14 NOTE — Anesthesia Postprocedure Evaluation (Signed)
  Anesthesia Post-op Note  Patient: Courtney ApleyBrandy L [name]  Procedure(s) Performed: Procedure(s): BILATERAL TONSILLECTOMY (Bilateral)  Patient Location: PACU  Anesthesia Type: General   Level of Consciousness: awake, alert  and oriented  Airway and Oxygen Therapy: Patient Spontanous Breathing  Post-op Pain: mild  Post-op Assessment: Post-op Vital signs reviewed  Post-op Vital Signs: Reviewed  Last Vitals:  Filed Vitals:   09/14/13 0900  BP: 122/83  Pulse: 67  Temp:   Resp: 12    Complications: No apparent anesthesia complications

## 2013-09-14 NOTE — Op Note (Signed)
09/14/2013  7:56 AM  PATIENT:  Courtney ApleyBrandy L Kitts  22 y.o. female  PRE-OPERATIVE DIAGNOSIS:  CHRONIC TONSILLITIS   POST-OPERATIVE DIAGNOSIS:  CHRONIC TONSILLITIS   PROCEDURE:  Procedure(s): BILATERAL TONSILLECTOMY  SURGEON:  Surgeon(s): Serena ColonelJefry Tykisha Areola, MD  ANESTHESIA:   General  COUNTS: Correct   DICTATION: The patient was taken to the operating room and placed on the operating table in the supine position. Following induction of general endotracheal anesthesia, the table was turned and the patient was draped in a standard fashion. A Crowe-Davis mouthgag was inserted into the oral cavity and used to retract the tongue and mandible, then attached to the Mayo stand.  The tonsillectomy was then performed using electrocautery dissection, carefully dissecting the avascular plane between the capsule and constrictor muscles. Cautery was used for completion of hemostasis. The tonsils were discarded. They were medium-sized but deeply cryptic.  The pharynx was irrigated with saline and suctioned. An oral gastric tube was used to aspirate the contents of the stomach. The patient was then awakened from anesthesia and transferred to PACU in stable condition.   PATIENT DISPOSITION:  To PACA, stable

## 2013-09-14 NOTE — Discharge Instructions (Signed)
Tonsillectomy, Care After °Refer to this sheet in the next few weeks. These instructions provide you with information on caring for yourself after your procedure. Your health care provider may also give you more specific instructions. Your treatment has been planned according to current medical practices, but problems sometimes occur. Call your health care provider if you have any problems or questions after your procedure. °WHAT TO EXPECT AFTER THE PROCEDURE °After your procedure, it is typical to have the following: °· Your tongue will be numb, and your sense of taste will be reduced. °· Your swallowing will be difficult and painful. °· Your jaw may hurt or make a clicking noise when you yawn or chew. °· Liquids that you drink may leak out of your nose. °· Your voice may sound muffled. °· The area at the middle of the roof of your mouth (uvula) may be very swollen. °· You may have a constant cough and need to clear mucus and phlegm from your throat. °HOME CARE INSTRUCTIONS  °· Get proper rest, keeping your head elevated at all times. °· Drink plenty of fluids. This reduces pain and hastens the healing process. °· Only take over-the-counter or prescription medicines for pain, discomfort, or fever as directed by your health care provider. Do not take aspirin or nonsteroidal anti-inflammatory drugs, such as ibuprofen, for 2 weeks after surgery. These medicines increase the possibility of bleeding. °· Soft and cold foods, such as gelatin, sherbet, ice cream, frozen ice pops, and cold drinks, are usually the easiest to eat. Several days after surgery, you will be able to eat more solid food. °· Avoid mouthwashes and gargles. °· Avoid contact with people who have upper respiratory infections, such as colds and sore throats. °SEEK MEDICAL CARE IF:  °· You have increasing pain that is not controlled with medicines. °· You have a fever. °· You have a rash. °· You feel lightheaded or faint. °· You are unable to swallow even  small amounts of liquid or saliva. °· Your urine is becoming very dark. °SEEK IMMEDIATE MEDICAL CARE IF:  °· You have difficulty breathing. °· You experience side effects or allergic reactions to medicines. °· You bleed bright red blood from your throat, or you vomit bright red blood. °MAKE SURE YOU: °· Understand these instructions. °· Will watch your condition. °· Will get help right away if you are not doing well or get worse. °Document Released: 12/21/2003 Document Revised: 02/24/2013 Document Reviewed: 09/16/2012 °Exit[employer]® Patient Information ©2015 [employer]. This information is not intended to replace advice given to you by your health care provider. Make sure you discuss any questions you have with your health care provider. ° ° °Post Anesthesia Home Care Instructions ° °Activity: °Get plenty of rest for the remainder of the day. A responsible adult should stay with you for 24 hours following the procedure.  °For the next 24 hours, DO NOT: °-Drive a car °-Operate machinery °-Drink alcoholic beverages °-Take any medication unless instructed by your physician °-Make any legal decisions or sign important papers. ° °Meals: °Start with liquid foods such as gelatin or soup. Progress to regular foods as tolerated. Avoid greasy, spicy, heavy foods. If nausea and/or vomiting occur, drink only clear liquids until the nausea and/or vomiting subsides. Call your physician if vomiting continues. ° °Special Instructions/Symptoms: °Your throat may feel dry or sore from the anesthesia or the breathing tube placed in your throat during surgery. If this causes discomfort, gargle with warm salt water. The discomfort should disappear within   24 hours. ° °

## 2013-09-14 NOTE — Anesthesia Procedure Notes (Signed)
Procedure Name: Intubation Date/Time: 09/14/2013 7:34 AM Performed by: Mertens DesanctisLINKA, [name] Pre-anesthesia Checklist: Patient identified, Emergency Drugs available, Suction available, Patient being monitored and Timeout performed Patient Re-evaluated:Patient Re-evaluated prior to inductionOxygen Delivery Method: Circle System Utilized Preoxygenation: Pre-oxygenation with 100% oxygen Intubation Type: IV induction Ventilation: Mask ventilation without difficulty Laryngoscope Size: Miller and 3 Tube type: Oral Number of attempts: 1 Airway Equipment and Method: stylet and oral airway Placement Confirmation: ETT inserted through vocal cords under direct vision,  positive ETCO2 and breath sounds checked- equal and bilateral Secured at: 20.5 cm Tube secured with: Tape Dental Injury: Teeth and Oropharynx as per pre-operative assessment

## 2013-09-14 NOTE — Interval H&P Note (Signed)
History and Physical Interval Note:  09/14/2013 7:20 AM  Courtney [name]  has presented today for surgery, with the diagnosis of CHRONIC TONSILLITIS   The various methods of treatment have been discussed with the patient and family. After consideration of risks, benefits and other options for treatment, the patient has consented to  Procedure(s): BILATERAL TONSILLECTOMY (Bilateral) as a surgical intervention .  The patient's history has been reviewed, patient examined, no change in status, stable for surgery.  I have reviewed the patient's chart and labs.  Questions were answered to the patient's satisfaction.     Darilyn Storbeck

## 2013-09-14 NOTE — OR Nursing (Signed)
4 earrings cut from ears-2 single rings removed from each ear-Sent with Patient

## 2013-09-15 ENCOUNTER — Other Ambulatory Visit: Payer: Self-pay

## 2013-09-15 ENCOUNTER — Encounter (HOSPITAL_BASED_OUTPATIENT_CLINIC_OR_DEPARTMENT_OTHER): Payer: Self-pay | Admitting: Otolaryngology

## 2013-09-15 MED ORDER — METHYLPHENIDATE 30 MG/9HR TD PTCH: MEDICATED_PATCH | TRANSDERMAL | Status: DC

## 2013-09-15 NOTE — Telephone Encounter (Signed)
Spoke to pt and informed her Rx is available for pickup.  

## 2013-09-15 NOTE — Telephone Encounter (Signed)
pts mother left v/m requesting rx Daytrana; call when ready for pick up. Call pts mother for pick up; pt just had tonsils removed on 09/14/13.

## 2013-12-22 ENCOUNTER — Ambulatory Visit (INDEPENDENT_AMBULATORY_CARE_PROVIDER_SITE_OTHER): Payer: 59 | Admitting: Family Medicine

## 2013-12-22 ENCOUNTER — Encounter: Payer: Self-pay | Admitting: Family Medicine

## 2013-12-22 VITALS — BP 124/72 | HR 87 | Temp 98.0°F | Ht 60.75 in | Wt 156.5 lb

## 2013-12-22 DIAGNOSIS — Z0289 Encounter for other administrative examinations: Secondary | ICD-10-CM

## 2013-12-22 MED ORDER — METHYLPHENIDATE 30 MG/9HR TD PTCH: MEDICATED_PATCH | TRANSDERMAL | Status: DC

## 2013-12-22 NOTE — Progress Notes (Signed)
Pre visit review using our clinic review tool, if applicable. No additional management support is needed unless otherwise documented below in the visit note. 

## 2013-12-22 NOTE — Progress Notes (Signed)
   Subjective:    Patient ID: Courtney [name], female    DOB: Jan 08, 1991, 23 y.o.   MRN: [id number]  HPI 23 yo pleasant female here to fill out form for work.  Has appt for CPX later in the year.  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  It lists tasks she may be required to do.  No other questions were asked of me.  Pt denies any CP, SOB, dizziness, or syncope with exertion.  Current Outpatient Prescriptions on File Prior to Visit  Medication Sig Dispense Refill  . medroxyPROGESTERone (DEPO-PROVERA) 150 MG/ML injection Inject 150 mg into the muscle every 3 (three) months.      . montelukast (SINGULAIR) 10 MG tablet Take 10 mg by mouth at bedtime.       No current facility-administered medications on file prior to visit.    No Known Allergies  Past Medical History  Diagnosis Date  . ADHD (attention deficit hyperactivity disorder)   . Chronic tonsillitis 08/2013    current strep, will finish antibiotic 09/04/2013; snores during sleep, mother denies apnea    Past Surgical History  Procedure Laterality Date  . Wisdom tooth extraction    . Tympanoplasty Bilateral   . Tonsillectomy Bilateral 09/14/2013    Procedure: BILATERAL TONSILLECTOMY;  Surgeon: Serena ColonelJefry Rosen, MD;  Location: [facility];  Service: ENT;  Laterality: Bilateral;    Family History  Problem Relation Age of Onset  . Hypertension Mother   . Hyperlipidemia Mother     History   Social History  . Marital Status: Single    Spouse Name: N/A    Number of Children: N/A  . Years of Education: N/A   Occupational History  . Not on file.   Social History Main Topics  . Smoking status: Never Smoker   . Smokeless tobacco: Never Used  . Alcohol Use: Yes     Comment: occasionally  . Drug Use: No  . Sexual Activity: Not on file   Other Topics Concern  . Not on file   Social History Narrative   [employer]ends [employer]-  getting a degree in business and criminal justice   The PMH, PSH, Social History, Family History, Medications, and allergies have been reviewed in [facility], and have been updated if relevant.    Review of Systems  Respiratory: Negative.   Cardiovascular: Negative.   Neurological: Negative.   Psychiatric/Behavioral: Negative.   All other systems reviewed and are negative.      Objective:   Physical Exam  Nursing note and vitals reviewed. Constitutional: She appears well-developed and well-nourished. No distress.  Cardiovascular: Normal rate, regular rhythm and normal heart sounds.   Pulmonary/Chest: Effort normal and breath sounds normal.  Skin: Skin is warm and dry.  Psychiatric: She has a normal mood and affect. Her behavior is normal. Judgment and thought content normal.          Assessment & Plan:

## 2013-12-22 NOTE — Assessment & Plan Note (Signed)
Form completed and returned to the patient. I do not see any reason why she cannot participate. EKG not requested.

## 2014-01-05 ENCOUNTER — Telehealth: Payer: Self-pay

## 2014-01-05 NOTE — Telephone Encounter (Signed)
Please let pt know and ask her how she would like to proceed.

## 2014-01-05 NOTE — Telephone Encounter (Signed)
[facility]/m requesting cb about changing Daytrana 30 mg patch;  Daytrana 30 mg patch is not available since 11/2013. Ryan said Daytrana 20 mg tabs and Daytrana 15 mg patches are available. Please call [phone] using ref # [id number] to substitute med.Please advise.

## 2014-01-06 NOTE — Telephone Encounter (Signed)
[facility] left [facility] requesting callback to change daytrana 30mg  patch to 20mg  patch. 30mg  patches are not avaible and they do not when they will be available. Rx will be cancel if they do not hear back from our office in 72hrs. [phone] ref #[id number]

## 2014-01-06 NOTE — Telephone Encounter (Signed)
Lm on pts vm requesting a call back. Dr Dayton MartesAron awaiting to see what is pts preference.

## 2014-01-06 NOTE — Telephone Encounter (Signed)
Lm on pts vm and requesting a call back

## 2014-01-07 NOTE — Telephone Encounter (Signed)
Lm on pts vm requesting a call back 

## 2014-01-13 MED ORDER — METHYLPHENIDATE 30 MG/9HR TD PTCH: MEDICATED_PATCH | TRANSDERMAL | Status: DC

## 2014-01-13 NOTE — Telephone Encounter (Signed)
Pts mother Annice PihJackie left v/m; pt is out of daytrana patches and [facility]. Annice PihJackie said [facility] advised pt to get 30 day rx locally and [facility].Annice PihJackie request cb today.

## 2014-01-13 NOTE — Telephone Encounter (Signed)
Ok to fill per Dr Aron 

## 2014-01-13 NOTE — Addendum Note (Signed)
Addended by: Desmond DikeKNIGHT, Lelon Ikard H on: 01/13/2014 11:32 AM   Modules accepted: Orders

## 2014-01-13 NOTE — Telephone Encounter (Signed)
Original Rx was cancelled by [facility] after there was no response from pt on new dose. Pt is now picking up new Rx to take to local pharmacy to have filled; original pharmacy did not have 30mg 

## 2014-02-02 ENCOUNTER — Other Ambulatory Visit: Payer: Self-pay

## 2014-02-02 MED ORDER — METHYLPHENIDATE 30 MG/9HR TD PTCH: MEDICATED_PATCH | TRANSDERMAL | Status: DC

## 2014-02-02 NOTE — Telephone Encounter (Signed)
Pts mother left v/m requesting rx for Daytrana patches; call when ready for pick up. Pt last seen 12/22/13; no future appt scheduled. Pts mother has previously discussed with Dr Dayton MartesAron about getting a 90 day rx. Med list has not been changed from 30 day rx until approved by Dr Dayton MartesAron.Please advise.DPR signed to speak with pts mother.

## 2014-02-02 NOTE — Telephone Encounter (Signed)
Left vm informing pt Rx available for pickup; advised 3rd party unable to pickup

## 2014-02-04 ENCOUNTER — Encounter: Payer: Self-pay | Admitting: Family Medicine

## 2014-02-04 ENCOUNTER — Telehealth: Payer: Self-pay | Admitting: Family Medicine

## 2014-02-04 NOTE — Telephone Encounter (Signed)
Pt came in this morning to pick her Daytrana RX up and it was supposed to be for 90 days per patient but was written for 60 days. Pt agreed to take it but would like it to be for 90 days next time if possible.

## 2014-02-19 ENCOUNTER — Encounter: Payer: Self-pay | Admitting: Family Medicine

## 2014-02-22 ENCOUNTER — Other Ambulatory Visit: Payer: Self-pay

## 2014-02-22 MED ORDER — METHYLPHENIDATE 30 MG/9HR TD PTCH: MEDICATED_PATCH | TRANSDERMAL | Status: DC

## 2014-02-22 NOTE — Telephone Encounter (Signed)
Pt's mother Annice PihJackie said that pt request rx for Daytrana patch; pt uses mailorder pharmacy  And needs to request now to have time to receive med from pharmacy before pt is out of med. Annice PihJackie also request quantity of #180 instead of one month or #60.Annice PihJackie request cb when ready for pick up.Have not changed quantity until approval by Dr Dayton MartesAron.

## 2014-02-23 NOTE — Telephone Encounter (Signed)
Spoke to pt and informed her Rx is available for pickup from the front desk 

## 2014-03-02 ENCOUNTER — Telehealth: Payer: Self-pay | Admitting: Family Medicine

## 2014-03-02 NOTE — Telephone Encounter (Signed)
Pt walked in to pick up rx for Daytrana patch.  She was very concerned that she had not received the requested #180.  Amber S checked with both the CMA and other providers, but it was recommended that pt wait until Dr. Dayton MartesAron returned to work next week.  Due to patient's distress, I spoke with her to inform of decision to wait until next week.  She scheduled an appointment to discuss her med management with Dr. Dayton MartesAron on 03/11/14 / lt

## 2014-03-08 ENCOUNTER — Encounter: Payer: Self-pay | Admitting: Family Medicine

## 2014-03-11 ENCOUNTER — Encounter: Payer: Self-pay | Admitting: Family Medicine

## 2014-03-11 ENCOUNTER — Ambulatory Visit: Payer: 59 | Admitting: Family Medicine

## 2014-03-11 ENCOUNTER — Ambulatory Visit (INDEPENDENT_AMBULATORY_CARE_PROVIDER_SITE_OTHER): Payer: 59 | Admitting: Family Medicine

## 2014-03-11 VITALS — BP 130/74 | HR 80 | Temp 98.0°F | Wt 171.8 lb

## 2014-03-11 DIAGNOSIS — F909 Attention-deficit hyperactivity disorder, unspecified type: Secondary | ICD-10-CM

## 2014-03-11 MED ORDER — MONTELUKAST SODIUM 10 MG PO TABS: 10.0000 mg | ORAL_TABLET | Freq: Every day | ORAL | Status: DC

## 2014-03-11 NOTE — Progress Notes (Signed)
   Subjective:   Patient ID: Courtney [name], female    DOB: 1990/08/18, 24 y.o.   MRN: [id number]  Courtney [name] is a pleasant 24 y.o. year old female who presents to clinic today with her mom for Follow-up  on 03/11/2014  HPI: ADHD- symptoms have improved since she restarted Daytrana. She would like to continue at this current dose. Her mom has noticed a huge improvement in her concentration and mood.  No difficulty falling or staying a sleep.  She declines feeling depressed or anxious.  No palpitations.  No CP or SOB.  Current Outpatient Prescriptions on File Prior to Visit  Medication Sig Dispense Refill  . methylphenidate (DAYTRANA) 30 MG/9HR Wear two patchs for 9 hours only each day 60 patch 0   No current facility-administered medications on file prior to visit.    No Known Allergies  Past Medical History  Diagnosis Date  . ADHD (attention deficit hyperactivity disorder)   . Chronic tonsillitis 08/2013    current strep, will finish antibiotic 09/04/2013; snores during sleep, mother denies apnea    Past Surgical History  Procedure Laterality Date  . Wisdom tooth extraction    . Tympanoplasty Bilateral   . Tonsillectomy Bilateral 09/14/2013    Procedure: BILATERAL TONSILLECTOMY;  Surgeon: Serena ColonelJefry Rosen, MD;  Location: [facility];  Service: ENT;  Laterality: Bilateral;    Family History  Problem Relation Age of Onset  . Hypertension Mother   . Hyperlipidemia Mother     History   Social History  . Marital Status: Single    Spouse Name: N/A    Number of Children: N/A  . Years of Education: N/A   Occupational History  . Not on file.   Social History Main Topics  . Smoking status: Never Smoker   . Smokeless tobacco: Never Used  . Alcohol Use: Yes     Comment: occasionally  . Drug Use: No  . Sexual Activity: Not on file   Other Topics Concern  . Not on file   Social History Narrative   [employer]ends [employer]- getting a degree  in business and criminal justice   The PMH, PSH, Social History, Family History, Medications, and allergies have been [facility], and have been updated if relevant.   Review of Systems  Constitutional: Negative.   HENT: Negative.   Respiratory: Negative.   Cardiovascular: Negative.   Allergic/Immunologic: Negative.   Neurological: Negative.   Hematological: Negative.   Psychiatric/Behavioral: Negative.   All other systems reviewed and are negative.      Objective:    BP 130/74 mmHg  Pulse 80  Temp(Src) 98 F (36.7 C) (Oral)  Wt 171 lb 12 oz (77.905 kg)  SpO2 98%   Physical Exam  Constitutional: She is oriented to person, place, and time. She appears well-developed and well-nourished. No distress.  HENT:  Head: Normocephalic.  Cardiovascular: Normal rate.   Pulmonary/Chest: Effort normal.  Neurological: She is alert and oriented to person, place, and time. No cranial nerve deficit.  Skin: Skin is warm and dry.  Psychiatric: She has a normal mood and affect. Her behavior is normal. Judgment and thought content normal.          Assessment & Plan:   Attention deficit hyperactivity disorder (ADHD), unspecified ADHD type No Follow-up on file.

## 2014-03-11 NOTE — Assessment & Plan Note (Signed)
Well controlled on current rx. UDS UTD. Rx refilled.

## 2014-04-13 ENCOUNTER — Other Ambulatory Visit: Payer: Self-pay

## 2014-04-13 MED ORDER — METHYLPHENIDATE 30 MG/9HR TD PTCH: MEDICATED_PATCH | TRANSDERMAL | Status: DC

## 2014-04-13 NOTE — Telephone Encounter (Signed)
pts mother left v/m requesting rx for Daytrana patches; call when ready for pick up. (DPR signed to speak with pts mother). Pt last seen 03/11/14.

## 2014-04-13 NOTE — Telephone Encounter (Signed)
Spoke to pt and informed her Rx is available for pickup from the front desk 

## 2014-05-12 ENCOUNTER — Other Ambulatory Visit: Payer: Self-pay

## 2014-05-12 MED ORDER — METHYLPHENIDATE 30 MG/9HR TD PTCH: MEDICATED_PATCH | TRANSDERMAL | Status: DC

## 2014-05-12 NOTE — Telephone Encounter (Signed)
Spoke to pt and informed her Rx is available for pickup.  

## 2014-05-12 NOTE — Telephone Encounter (Signed)
pts mother request rx for daytrana. Call when ready for pick up. Pt last seen 03/11/14.Please advise.

## 2014-05-18 ENCOUNTER — Telehealth: Payer: Self-pay

## 2014-05-18 NOTE — Telephone Encounter (Signed)
Spoke to pts mother who states that the pt has found a pharmacy and has taken it to be filled. I advised mother that pt will need to come back to the office and update her controlled substance contract with the new pharmacy. Mother is requesting a new Rx for pt to have 20mg  patch as the 30mg  is on national recall for the adhesive. She states that it is difficult to find the 30mg , which is the reason that the pt has being going to numerous pharmacies. Mother req a cb regarding new Rx.

## 2014-05-18 NOTE — Telephone Encounter (Signed)
Spoke to pt who states there has been a recall on 30mg  patch. Advised pt that we will have to send to Dr Dayton MartesAron for review as this is considered a new Rx. Pt states that she is able to get 20mg  from an alternate pharmacy. I advised pt that per her contract, she is to only go to the pharmacy listed in her contract. Pt then states "Why does it matter this time, i go to different pharmacies all the time." I explained to pt that not receiving Rx from pharmacy listed on contract was a breech, and it will be at Dr Elmer SowAron's discretion. Pt advised Rx may not be available until tomorrow as Dr Dayton MartesAron is no longer in clinic.

## 2014-05-18 NOTE — Telephone Encounter (Signed)
pts mother left v/m requesting substitute for Daytrana patches due to recall on adhesive of Daytrana patches. Annice PihJackie pts mother request cb today.

## 2014-05-18 NOTE — Telephone Encounter (Signed)
Pt walked in and she has found a pharmacy that still has Daytrana patches 20 mg and pt request new rx for Daytrana 20 mg patch; [facility]. Pt said has to have done before 5 pm today while pharmacy still has patches available. Pt wants to wait for rx.

## 2014-05-18 NOTE — Telephone Encounter (Signed)
Mom called back Gearldine BienenstockBrandy is on her way back to pick up rx. She found drug store that had them in stock please call mom [name]([name]) tomorrow [phone]

## 2014-05-29 ENCOUNTER — Encounter (HOSPITAL_COMMUNITY): Payer: Self-pay | Admitting: Emergency Medicine

## 2014-05-29 ENCOUNTER — Emergency Department (HOSPITAL_COMMUNITY): Payer: 59

## 2014-05-29 ENCOUNTER — Emergency Department (HOSPITAL_COMMUNITY)
Admission: EM | Admit: 2014-05-29 | Discharge: 2014-05-29 | Disposition: A | Discharge status: Home / Self Care | Payer: 59 | Attending: Emergency Medicine | Admitting: Emergency Medicine

## 2014-05-29 DIAGNOSIS — F909 Attention-deficit hyperactivity disorder, unspecified type: Secondary | ICD-10-CM | POA: Insufficient documentation

## 2014-05-29 DIAGNOSIS — Y9389 Activity, other specified: Secondary | ICD-10-CM | POA: Insufficient documentation

## 2014-05-29 DIAGNOSIS — W01198A Fall on same level from slipping, tripping and stumbling with subsequent striking against other object, initial encounter: Secondary | ICD-10-CM | POA: Diagnosis not present

## 2014-05-29 DIAGNOSIS — Y998 Other external cause status: Secondary | ICD-10-CM | POA: Diagnosis not present

## 2014-05-29 DIAGNOSIS — S161XXA Strain of muscle, fascia and tendon at neck level, initial encounter: Secondary | ICD-10-CM | POA: Diagnosis not present

## 2014-05-29 DIAGNOSIS — Z8709 Personal history of other diseases of the respiratory system: Secondary | ICD-10-CM | POA: Diagnosis not present

## 2014-05-29 DIAGNOSIS — H539 Unspecified visual disturbance: Secondary | ICD-10-CM

## 2014-05-29 DIAGNOSIS — S0990XA Unspecified injury of head, initial encounter: Secondary | ICD-10-CM | POA: Diagnosis not present

## 2014-05-29 DIAGNOSIS — H538 Other visual disturbances: Secondary | ICD-10-CM | POA: Insufficient documentation

## 2014-05-29 DIAGNOSIS — Y9289 Other specified places as the place of occurrence of the external cause: Secondary | ICD-10-CM | POA: Diagnosis not present

## 2014-05-29 MED ORDER — IBUPROFEN 800 MG PO TABS: 800.0000 mg | ORAL_TABLET | Freq: Three times a day (TID) | ORAL | Status: DC

## 2014-05-29 NOTE — ED Notes (Signed)
Pt reports seeing double out of left eye. Pt also eports a HA when closing her eyes.

## 2014-05-29 NOTE — ED Provider Notes (Signed)
CSN: [id number]     Arrival date & time 05/29/14  1130 History   This chart was scribed for Courtney Lower, NP working with Linwood Dibbles, MD by Evon Slack, ED Scribe. This patient was seen in room TR07C/TR07C and the patient's care was started at 11:56 AM.      Chief Complaint  Patient presents with  . Fall  . Head Injury  . Diplopia   The history is provided by the patient. No language interpreter was used.   HPI Comments: Courtney [name] is a 24 y.o. female who presents to the Emergency Department complaining of fall onset 1 night prior Pt reports posterior neck pain, HA, blurred vision out the left eye. Pt states she was trying to jump on her boyfriends back. Pt states she fell backwards hitting her head but denies LOC. Pt states that she fell onto gravel and dirt. Pt states that closing her eyes make her symptoms worse. Pt denies numbness or weakness.    Past Medical History  Diagnosis Date  . ADHD (attention deficit hyperactivity disorder)   . Chronic tonsillitis 08/2013    current strep, will finish antibiotic 09/04/2013; snores during sleep, mother denies apnea   Past Surgical History  Procedure Laterality Date  . Wisdom tooth extraction    . Tympanoplasty Bilateral   . Tonsillectomy Bilateral 09/14/2013    Procedure: BILATERAL TONSILLECTOMY;  Surgeon: Serena Colonel, MD;  Location: [facility];  Service: ENT;  Laterality: Bilateral;   Family History  Problem Relation Age of Onset  . Hypertension Mother   . Hyperlipidemia Mother    History  Substance Use Topics  . Smoking status: Never Smoker   . Smokeless tobacco: Never Used  . Alcohol Use: Yes     Comment: occasionally   OB History    No data available     Review of Systems  Eyes: Positive for visual disturbance.  Musculoskeletal: Positive for neck pain.  Neurological: Positive for headaches. Negative for syncope.  All other systems reviewed and are negative.     Allergies  Review of patient's  allergies indicates no known allergies.  Home Medications   Prior to Admission medications   Medication Sig Start Date End Date Taking? Authorizing Provider  etonogestrel (NEXPLANON) 68 MG IMPL implant 1 each by Subdermal route once.    Historical Provider, MD  ibuprofen (ADVIL,MOTRIN) 800 MG tablet Take 1 tablet (800 mg total) by mouth 3 (three) times daily. 05/29/14   Courtney Lower, NP  methylphenidate [facility]) 30 MG/9HR Wear two patchs for 9 hours only each day 05/12/14   Dianne Dun, MD  montelukast (SINGULAIR) 10 MG tablet Take 1 tablet (10 mg total) by mouth at bedtime. 03/11/14   Dianne Dun, MD   BP 118/72 mmHg  Pulse 85  Temp(Src) 97.2 F (36.2 C) (Oral)  Resp 20  Ht 5' (1.524 m)  Wt 170 lb (77.111 kg)  BMI 33.20 kg/m2  SpO2 99%   Physical Exam  Constitutional: She is oriented to person, place, and time. She appears well-developed and well-nourished. No distress.  HENT:  Head: Normocephalic and atraumatic.  Right Ear: External ear normal.  Left Ear: External ear normal.  Mouth/Throat: Oropharynx is clear and moist.  Eyes: Conjunctivae and EOM are normal. Pupils are equal, round, and reactive to light.  Neck: Neck supple. No tracheal deviation present.  Cardiovascular: Normal rate.   Pulmonary/Chest: Effort normal. No respiratory distress.  Musculoskeletal: Normal range of motion.  Cervical back: She exhibits bony tenderness.       Thoracic back: Normal.       Lumbar back: Normal.  Neurological: She is alert and oriented to person, place, and time.  Skin: Skin is warm and dry.  Psychiatric: She has a normal mood and affect. Her behavior is normal.  Nursing note and vitals reviewed.   ED Course  Procedures (including critical care time) DIAGNOSTIC STUDIES: Oxygen Saturation is 99% on RA, normal by my interpretation.    COORDINATION OF CARE: 12:01 PM-Discussed treatment plan with pt at bedside and pt agreed to plan.     Labs Review Labs Reviewed - No  data to display  Imaging Review Dg Cervical Spine Complete  05/29/2014   CLINICAL DATA:  Patient status post fall. Posterior neck and head pain.  EXAM: CERVICAL SPINE  4+ VIEWS  COMPARISON:  None.  FINDINGS: Visualization through C7 on lateral view. Preservation of vertebral body and intervertebral disc space heights. Normal anatomic alignment. Prevertebral soft tissues are unremarkable. No osseous neural foraminal narrowing. Lung apices are unremarkable. Lateral masses articulate appropriately with the dens.  IMPRESSION: No acute osseous abnormality.   Electronically Signed   By: Annia Beltrew  Davis M.D.   On: 05/29/2014 13:23   Ct Head Wo Contrast  05/29/2014   CLINICAL DATA:  Fall, headache, blurred vision, hit back of the head  EXAM: CT HEAD WITHOUT CONTRAST  TECHNIQUE: Contiguous axial images were obtained from the base of the skull through the vertex without intravenous contrast.  COMPARISON:  None.  FINDINGS: No skull fracture is noted. No hydrocephalus. No intracranial hemorrhage, mass effect or midline shift. Paranasal sinuses and mastoid air cells are unremarkable.  No acute infarction. No mass lesion is noted on this unenhanced scan. No intra or extra-axial fluid collection. The gray and white-matter differentiation is preserved.  IMPRESSION: No acute intracranial abnormality.   Electronically Signed   By: Natasha MeadLiviu  Pop M.D.   On: 05/29/2014 12:39     EKG Interpretation None      MDM   Final diagnoses:  Head injury, initial encounter  Visual changes  Cervical strain, initial encounter    Pt is neurologically intact. Full rom or all extremities. Discussed symptomatic treatment and return precautions  I personally performed the services described in this documentation, which was scribed in my presence. The recorded information has been reviewed and is accurate.     Courtney LowerVrinda Gaines Cartmell, NP 05/29/14 1559  Linwood DibblesJon Knapp, MD 05/30/14 [phone]

## 2014-05-29 NOTE — ED Notes (Signed)
Pt reports fell last night trying to get onto someone else back. Pt fell and hit head, No LOC.

## 2014-05-29 NOTE — Discharge Instructions (Signed)
Concussion  A concussion, or closed-head injury, is a brain injury caused by a direct blow to the head or by a quick and sudden movement (jolt) of the head or neck. Concussions are usually not life-threatening. Even so, the effects of a concussion can be serious. If you have had a concussion before, you are more likely to experience concussion-like symptoms after a direct blow to the head.   CAUSES  · Direct blow to the head, such as from running into another player during a soccer game, being hit in a fight, or hitting your head on a hard surface.  · A jolt of the head or neck that causes the brain to move back and forth inside the skull, such as in a car crash.  SIGNS AND SYMPTOMS  The signs of a concussion can be hard to notice. Early on, they may be missed by you, family members, and health care providers. You may look fine but act or feel differently.  Symptoms are usually temporary, but they may last for days, weeks, or even longer. Some symptoms may appear right away while others may not show up for hours or days. Every head injury is different. Symptoms include:  · Mild to moderate headaches that will not go away.  · A feeling of pressure inside your head.  · Having more trouble than usual:  ¨ Learning or remembering things you have heard.  ¨ Answering questions.  ¨ Paying attention or concentrating.  ¨ Organizing daily tasks.  ¨ Making decisions and solving problems.  · Slowness in thinking, acting or reacting, speaking, or reading.  · Getting lost or being easily confused.  · Feeling tired all the time or lacking energy (fatigued).  · Feeling drowsy.  · Sleep disturbances.  ¨ Sleeping more than usual.  ¨ Sleeping less than usual.  ¨ Trouble falling asleep.  ¨ Trouble sleeping (insomnia).  · Loss of balance or feeling lightheaded or dizzy.  · Nausea or vomiting.  · Numbness or tingling.  · Increased sensitivity to:  ¨ Sounds.  ¨ Lights.  ¨ Distractions.  · Vision problems or eyes that tire  easily.  · Diminished sense of taste or smell.  · Ringing in the ears.  · Mood changes such as feeling sad or anxious.  · Becoming easily irritated or angry for little or no reason.  · Lack of motivation.  · Seeing or hearing things other people do not see or hear (hallucinations).  DIAGNOSIS  Your health care provider can usually diagnose a concussion based on a description of your injury and symptoms. He or she will ask whether you passed out (lost consciousness) and whether you are having trouble remembering events that happened right before and during your injury.  Your evaluation might include:  · A brain scan to look for signs of injury to the brain. Even if the test shows no injury, you may still have a concussion.  · Blood tests to be sure other problems are not present.  TREATMENT  · Concussions are usually treated in an emergency department, in urgent care, or at a clinic. You may need to stay in the hospital overnight for further treatment.  · Tell your health care provider if you are taking any medicines, including prescription medicines, over-the-counter medicines, and natural remedies. Some medicines, such as blood thinners (anticoagulants) and aspirin, may increase the chance of complications. Also tell your health care provider whether you have had alcohol or are taking illegal drugs. This information   may affect treatment.  · Your health care provider will send you home with important instructions to follow.  · How fast you will recover from a concussion depends on many factors. These factors include how severe your concussion is, what part of your brain was injured, your age, and how healthy you were before the concussion.  · Most people with mild injuries recover fully. Recovery can take time. In general, recovery is slower in older persons. Also, persons who have had a concussion in the past or have other medical problems may find that it takes longer to recover from their current injury.  HOME  CARE INSTRUCTIONS  General Instructions  · Carefully follow the directions your health care provider gave you.  · Only take over-the-counter or prescription medicines for pain, discomfort, or fever as directed by your health care provider.  · Take only those medicines that your health care provider has approved.  · Do not drink alcohol until your health care provider says you are well enough to do so. Alcohol and certain other drugs may slow your recovery and can put you at risk of further injury.  · If it is harder than usual to remember things, write them down.  · If you are easily distracted, try to do one thing at a time. For example, do not try to watch TV while fixing dinner.  · Talk with family members or close friends when making important decisions.  · Keep all follow-up appointments. Repeated evaluation of your symptoms is recommended for your recovery.  · Watch your symptoms and tell others to do the same. Complications sometimes occur after a concussion. Older adults with a brain injury may have a higher risk of serious complications, such as a blood clot on the brain.  · Tell your teachers, school nurse, school counselor, coach, athletic trainer, or work manager about your injury, symptoms, and restrictions. Tell them about what you can or cannot do. They should watch for:  ¨ Increased problems with attention or concentration.  ¨ Increased difficulty remembering or learning new information.  ¨ Increased time needed to complete tasks or assignments.  ¨ Increased irritability or decreased ability to cope with stress.  ¨ Increased symptoms.  · Rest. Rest helps the brain to heal. Make sure you:  ¨ Get plenty of sleep at night. Avoid staying up late at night.  ¨ Keep the same bedtime hours on weekends and weekdays.  ¨ Rest during the day. Take daytime naps or rest breaks when you feel tired.  · Limit activities that require a lot of thought or concentration. These include:  ¨ Doing homework or job-related  work.  ¨ Watching TV.  ¨ Working on the computer.  · Avoid any situation where there is potential for another head injury (football, hockey, soccer, basketball, martial arts, downhill snow sports and horseback riding). Your condition will get worse every time you experience a concussion. You should avoid these activities until you are evaluated by the appropriate follow-up health care providers.  Returning To Your Regular Activities  You will need to return to your normal activities slowly, not all at once. You must give your body and brain enough time for recovery.  · Do not return to sports or other athletic activities until your health care provider tells you it is safe to do so.  · Ask your health care provider when you can drive, ride a bicycle, or operate heavy machinery. Your ability to react may be slower after a   brain injury. Never do these activities if you are dizzy.  · Ask your health care provider about when you can return to work or school.  Preventing Another Concussion  It is very important to avoid another brain injury, especially before you have recovered. In rare cases, another injury can lead to permanent brain damage, brain swelling, or death. The risk of this is greatest during the first 7-10 days after a head injury. Avoid injuries by:  · Wearing a seat belt when riding in a car.  · Drinking alcohol only in moderation.  · Wearing a helmet when biking, skiing, skateboarding, skating, or doing similar activities.  · Avoiding activities that could lead to a second concussion, such as contact or recreational sports, until your health care provider says it is okay.  · Taking safety measures in your home.  ¨ Remove clutter and tripping hazards from floors and stairways.  ¨ Use grab bars in bathrooms and handrails by stairs.  ¨ Place non-slip mats on floors and in bathtubs.  ¨ Improve lighting in dim areas.  SEEK MEDICAL CARE IF:  · You have increased problems paying attention or  concentrating.  · You have increased difficulty remembering or learning new information.  · You need more time to complete tasks or assignments than before.  · You have increased irritability or decreased ability to cope with stress.  · You have more symptoms than before.  Seek medical care if you have any of the following symptoms for more than 2 weeks after your injury:  · Lasting (chronic) headaches.  · Dizziness or balance problems.  · Nausea.  · Vision problems.  · Increased sensitivity to noise or light.  · Depression or mood swings.  · Anxiety or irritability.  · Memory problems.  · Difficulty concentrating or paying attention.  · Sleep problems.  · Feeling tired all the time.  SEEK IMMEDIATE MEDICAL CARE IF:  · You have severe or worsening headaches. These may be a sign of a blood clot in the brain.  · You have weakness (even if only in one hand, leg, or part of the face).  · You have numbness.  · You have decreased coordination.  · You vomit repeatedly.  · You have increased sleepiness.  · One pupil is larger than the other.  · You have convulsions.  · You have slurred speech.  · You have increased confusion. This may be a sign of a blood clot in the brain.  · You have increased restlessness, agitation, or irritability.  · You are unable to recognize people or places.  · You have neck pain.  · It is difficult to wake you up.  · You have unusual behavior changes.  · You lose consciousness.  MAKE SURE YOU:  · Understand these instructions.  · Will watch your condition.  · Will get help right away if you are not doing well or get worse.  Document Released: 05/12/2003 Document Revised: 02/24/2013 Document Reviewed: 09/11/2012  [employer]® Patient Information ©2015 [employer]. This information is not intended to replace advice given to you by your health care provider. Make sure you discuss any questions you have with your health care provider.

## 2014-06-09 ENCOUNTER — Telehealth: Payer: Self-pay

## 2014-06-09 NOTE — Telephone Encounter (Signed)
Courtney [name] pts mother(DPR signed to speak with pts mother) left v/m requesting new rx for Daytrana patch 20 mg; the Daytrana patch 30 mg is on manufacturers back order and cannot find pharmacy that has Daytrana patch 30 mg. Annice PihJackie request cb. Pt last seen 03/11/2014.

## 2014-06-09 NOTE — Telephone Encounter (Signed)
OK to print rx as pt requests and update med list.

## 2014-06-10 MED ORDER — METHYLPHENIDATE 20 MG/9HR TD PTCH: 1.0000 | MEDICATED_PATCH | Freq: Every day | TRANSDERMAL | Status: DC

## 2014-06-10 NOTE — Telephone Encounter (Signed)
Lm on pts vm and informed her Rx will be available for pickup after 1400 

## 2014-06-14 MED ORDER — METHYLPHENIDATE 20 MG/9HR TD PTCH: 3.0000 | MEDICATED_PATCH | Freq: Every day | TRANSDERMAL | Status: DC

## 2014-06-14 MED ORDER — METHYLPHENIDATE 20 MG/9HR TD PTCH: 2.0000 | MEDICATED_PATCH | Freq: Every day | TRANSDERMAL | Status: DC

## 2014-06-14 NOTE — Telephone Encounter (Signed)
Annice PihJackie pts mother left v/m(DPR signed); request another rx for daytrana 20 mg with different instructions of using 2 patches per day. The Daytrana patch 20 mg was written for just 1 patch a day which will have pt go from 60 mg of Daytrana to 20 mg. pts mother request cb.

## 2014-06-14 NOTE — Addendum Note (Signed)
Addended by: Dianne DunARON, Kameria Canizares M on: 06/14/2014 01:28 PM   Modules accepted: Orders

## 2014-06-14 NOTE — Telephone Encounter (Signed)
Rx corrected and printed.

## 2014-06-14 NOTE — Telephone Encounter (Signed)
Patient notified, will pick up RX

## 2014-07-08 ENCOUNTER — Other Ambulatory Visit: Payer: Self-pay

## 2014-07-08 MED ORDER — METHYLPHENIDATE 20 MG/9HR TD PTCH: 2.0000 | MEDICATED_PATCH | Freq: Every day | TRANSDERMAL | Status: DC

## 2014-07-08 NOTE — Telephone Encounter (Signed)
pts mother on DPR  Left v/m requesting rx daytrana. Call when ready for pick up. Last printed # 60 on 06/14/14 and last seen 03/11/2014.

## 2014-07-08 NOTE — Telephone Encounter (Signed)
Lm on pts vm informing her Rx is available for pickup from the front desk 

## 2014-07-21 ENCOUNTER — Encounter: Payer: Self-pay | Admitting: Family Medicine

## 2014-07-21 ENCOUNTER — Ambulatory Visit (INDEPENDENT_AMBULATORY_CARE_PROVIDER_SITE_OTHER): Payer: 59 | Admitting: Family Medicine

## 2014-07-21 VITALS — BP 142/72 | HR 81 | Temp 98.1°F | Wt 174.8 lb

## 2014-07-21 DIAGNOSIS — F909 Attention-deficit hyperactivity disorder, unspecified type: Secondary | ICD-10-CM | POA: Diagnosis not present

## 2014-07-21 DIAGNOSIS — R5383 Other fatigue: Secondary | ICD-10-CM | POA: Diagnosis not present

## 2014-07-21 LAB — CBC WITH DIFFERENTIAL/PLATELET
BASOS ABS: 0 10*3/uL (ref 0.0–0.1)
Basophils Relative: 0.4 % (ref 0.0–3.0)
Eosinophils Absolute: 0.2 10*3/uL (ref 0.0–0.7)
Eosinophils Relative: 1.8 % (ref 0.0–5.0)
HEMATOCRIT: 42.1 % (ref 36.0–46.0)
HEMOGLOBIN: 14.5 g/dL (ref 12.0–15.0)
LYMPHS ABS: 2.4 10*3/uL (ref 0.7–4.0)
LYMPHS PCT: 27.7 % (ref 12.0–46.0)
MCHC: 34.4 g/dL (ref 30.0–36.0)
MCV: 88.6 fl (ref 78.0–100.0)
MONO ABS: 0.6 10*3/uL (ref 0.1–1.0)
Monocytes Relative: 6.5 % (ref 3.0–12.0)
Neutro Abs: 5.4 10*3/uL (ref 1.4–7.7)
Neutrophils Relative %: 63.6 % (ref 43.0–77.0)
PLATELETS: 254 10*3/uL (ref 150.0–400.0)
RBC: 4.75 Mil/uL (ref 3.87–5.11)
RDW: 13.8 % (ref 11.5–15.5)
WBC: 8.5 10*3/uL (ref 4.0–10.5)

## 2014-07-21 LAB — VITAMIN B12: Vitamin B-12: 252 pg/mL (ref 211–911)

## 2014-07-21 LAB — VITAMIN D 25 HYDROXY (VIT D DEFICIENCY, FRACTURES): VITD: 28.43 ng/mL — ABNORMAL LOW (ref 30.00–100.00)

## 2014-07-21 LAB — TSH: TSH: 1.6 u[IU]/mL (ref 0.35–4.50)

## 2014-07-21 NOTE — Assessment & Plan Note (Signed)
New- >25 minutes spent in face to face time with patient, >50% spent in counselling or coordination of care I advised against changing her ADHD medication since otherwise, she feels she is doing well on this current dose until we do a further fatigue work up with blood work. The patient indicates understanding of these issues and agrees with the plan.

## 2014-07-21 NOTE — Progress Notes (Signed)
Subjective:   Patient ID: Courtney [name], female    DOB: August 14, 1990, 24 y.o.   MRN: [id number]  Courtney [name] is a pleasant 24 y.o. year old female who presents to clinic today with her mom for Fatigue and Anorexia  on 07/21/2014  HPI:  Past few months seems fatigued all the time.  Sleeps well. Decreased appetite.  Denies feeling depressed.  Mom thinks it started after she had to decrease dose of Daytrana patch because of how it is now manufactured.  Actually feels her ADHD symptoms have been well controlled on this dose.  Denies fevers or chills.  No SOB.  Periods are not heavy.  Has norplant.  Current Outpatient Prescriptions on File Prior to Visit  Medication Sig Dispense Refill  . etonogestrel (NEXPLANON) 68 MG IMPL implant 1 each by Subdermal route once.    . methylphenidate (DAYTRANA) 20 MG/9HR Place 2 patches onto the skin daily. wear patch for 9 hours only each day 60 patch 0  . montelukast (SINGULAIR) 10 MG tablet Take 1 tablet (10 mg total) by mouth at bedtime. 90 tablet 3   No current facility-administered medications on file prior to visit.    No Known Allergies  Past Medical History  Diagnosis Date  . ADHD (attention deficit hyperactivity disorder)   . Chronic tonsillitis 08/2013    current strep, will finish antibiotic 09/04/2013; snores during sleep, mother denies apnea    Past Surgical History  Procedure Laterality Date  . Wisdom tooth extraction    . Tympanoplasty Bilateral   . Tonsillectomy Bilateral 09/14/2013    Procedure: BILATERAL TONSILLECTOMY;  Surgeon: Serena ColonelJefry Rosen, MD;  Location: [facility];  Service: ENT;  Laterality: Bilateral;    Family History  Problem Relation Age of Onset  . Hypertension Mother   . Hyperlipidemia Mother     History   Social History  . Marital Status: Single    Spouse Name: N/A  . Number of Children: N/A  . Years of Education: N/A   Occupational History  . Not on file.   Social History Main Topics   . Smoking status: Never Smoker   . Smokeless tobacco: Never Used  . Alcohol Use: Yes     Comment: occasionally  . Drug Use: No  . Sexual Activity: Not on file   Other Topics Concern  . Not on file   Social History Narrative   Attends [employer]- getting a degree in business and criminal justice   The PMH, PSH, Social History, Family History, Medications, and allergies have been reviewed in [facility], and have been updated if relevant.   Review of Systems  Constitutional: Positive for appetite change and fatigue. Negative for fever, chills and unexpected weight change.  HENT: Negative.   Eyes: Negative.   Respiratory: Negative.   Cardiovascular: Negative.   Gastrointestinal: Negative.   Endocrine: Negative.   Genitourinary: Negative.   Musculoskeletal: Negative.   Skin: Negative.   Hematological: Negative.   Psychiatric/Behavioral: Negative.        Objective:    BP 142/72 mmHg  Pulse 81  Temp(Src) 98.1 F (36.7 C) (Oral)  Wt 174 lb 12 oz (79.266 kg)  SpO2 98%  Wt Readings from Last 3 Encounters:  07/21/14 174 lb 12 oz (79.266 kg)  05/29/14 170 lb (77.111 kg)  03/11/14 171 lb 12 oz (77.905 kg)    Physical Exam  Constitutional: She is oriented to person, place, and time. She appears well-developed and well-nourished. No distress.  HENT:  Head: Normocephalic.  Eyes: Conjunctivae are normal.  Neck: Normal range of motion. Neck supple. Thyromegaly present.  Cardiovascular: Normal rate and regular rhythm.   Pulmonary/Chest: Effort normal and breath sounds normal.  Musculoskeletal: Normal range of motion.  Neurological: She is alert and oriented to person, place, and time.  Skin: Skin is warm and dry.  Psychiatric: She has a normal mood and affect. Her behavior is normal. Judgment and thought content normal.  Nursing note and vitals reviewed.         Assessment & Plan:   Other fatigue - Plan: TSH, CBC with Differential/Platelet, Vitamin B12,  Vitamin D, 25-hydroxy, Mononucleosis screen  Attention deficit hyperactivity disorder (ADHD), unspecified ADHD type No Follow-up on file.

## 2014-07-21 NOTE — Patient Instructions (Signed)
Good to see you. We will call you with your lab results.   

## 2014-07-21 NOTE — Progress Notes (Signed)
Pre visit review using our clinic review tool, if applicable. No additional management support is needed unless otherwise documented below in the visit note. 

## 2014-07-22 LAB — MONONUCLEOSIS SCREEN: Mono Screen: NEGATIVE

## 2014-07-22 MED ORDER — VITAMIN D (ERGOCALCIFEROL) 1.25 MG (50000 UNIT) PO CAPS
50000.0000 [IU] | ORAL_CAPSULE | ORAL | Status: DC
Start: 2014-07-22 — End: 2014-08-12

## 2014-07-22 NOTE — Addendum Note (Signed)
Addended by: Desmond DikeKNIGHT, Halbert Jesson H on: 07/22/2014 10:54 AM   Modules accepted: Orders

## 2014-08-10 ENCOUNTER — Telehealth: Payer: Self-pay

## 2014-08-10 MED ORDER — METHYLPHENIDATE 20 MG/9HR TD PTCH: 2.0000 | MEDICATED_PATCH | Freq: Every day | TRANSDERMAL | Status: DC

## 2014-08-10 NOTE — Telephone Encounter (Signed)
pts mother left v/m requesting rx for daytrana patch; call when ready for pick up.last seen 07/21/2014 and rx last printed # 60 on 07/08/2014.

## 2014-08-10 NOTE — Telephone Encounter (Signed)
Lm on pts vm and informed her Rx is available for pickup from the front desk. Pt advised third party unable to pickup  

## 2014-08-11 ENCOUNTER — Telehealth: Payer: Self-pay

## 2014-08-11 ENCOUNTER — Encounter: Payer: Self-pay | Admitting: Family Medicine

## 2014-08-11 NOTE — Telephone Encounter (Signed)
Pt came into office wanting to know if she could get a refill on Rx Vit d 50,000---Rx was prescribed 07/22/2014 for 6 weeks--pt states the pharmacy only gave her #4---per result note I advised pt after 6 week completion she is to take OTC 1600iu daily and keep lab appt to recheck levels--I advised pt to call pharmacy and find out about the quantity to why she only received #4--pt states she will call us back

## 2014-08-12 ENCOUNTER — Other Ambulatory Visit: Payer: Self-pay | Admitting: Family Medicine

## 2014-08-12 NOTE — Telephone Encounter (Signed)
[name] at [facility] [address] left v/m that pharmacy only had # 30 of Daytrana patches (Daytrana is on back order); pt did get # 30 which is a 15 day supply; this is FYI that pt will need Daytrana patch rx earlier than usual since only got 15 day supply.

## 2014-09-01 ENCOUNTER — Telehealth: Payer: Self-pay

## 2014-09-01 NOTE — Telephone Encounter (Signed)
[name] pts mother(DPR signed) left v/m requesting daytrana patches (last printed # 60 on 08/10/14) pt last seen 07/21/14. Wants to pick up rx this week because going out of town next week for vacation. Please advise.

## 2014-09-01 NOTE — Telephone Encounter (Signed)
Dr. Dayton MartesAron last note reviewed. RX printed and signed and placed on [facility] desk

## 2014-09-02 MED ORDER — METHYLPHENIDATE 20 MG/9HR TD PTCH: 2.0000 | MEDICATED_PATCH | Freq: Every day | TRANSDERMAL | Status: DC

## 2014-09-02 NOTE — Telephone Encounter (Signed)
i spoke to the pt, before the message below was entered and informed her Rx is available for pickup

## 2014-09-02 NOTE — Telephone Encounter (Signed)
pts mother left v/m requesting status of Daytrana rx and request cb ASAP; must pick up by 09/03/14.

## 2014-09-03 ENCOUNTER — Encounter: Payer: Self-pay | Admitting: Family Medicine

## 2014-09-22 ENCOUNTER — Other Ambulatory Visit: Payer: Self-pay | Admitting: Family Medicine

## 2014-09-22 DIAGNOSIS — E559 Vitamin D deficiency, unspecified: Secondary | ICD-10-CM

## 2014-09-30 ENCOUNTER — Other Ambulatory Visit (INDEPENDENT_AMBULATORY_CARE_PROVIDER_SITE_OTHER): Payer: 59

## 2014-09-30 DIAGNOSIS — E559 Vitamin D deficiency, unspecified: Secondary | ICD-10-CM

## 2014-09-30 LAB — VITAMIN D 25 HYDROXY (VIT D DEFICIENCY, FRACTURES): VITD: 41.54 ng/mL (ref 30.00–100.00)

## 2014-10-04 ENCOUNTER — Other Ambulatory Visit: Payer: Self-pay

## 2014-10-04 MED ORDER — METHYLPHENIDATE 20 MG/9HR TD PTCH: 2.0000 | MEDICATED_PATCH | Freq: Every day | TRANSDERMAL | Status: DC

## 2014-10-04 NOTE — Telephone Encounter (Signed)
[name] pts mom left v/m requesting rx for Daytrana.call when ready for pick up. rx last printed # 60 on 09/02/14 and pt last seen 07/21/14. [name] also wants to know if pt needs to have thyroid labs retested; pt is still very tired most of the time. [name] request cb.

## 2014-10-04 NOTE — Telephone Encounter (Signed)
Spoke to pt and informed her Rx is available for pickup from the front desk 

## 2014-10-06 ENCOUNTER — Telehealth: Payer: Self-pay

## 2014-10-06 NOTE — Telephone Encounter (Signed)
Yes ok to reschedule

## 2014-10-06 NOTE — Telephone Encounter (Signed)
Spoke to pt and scheduled f/u appt 

## 2014-10-06 NOTE — Telephone Encounter (Signed)
Pt left note; pt understands blood test on the 28th were normal but pt is still feeling very tired. Last time pt saw Dr Dayton Martes pt was advised thyroid was enlarged. Pt wants to know if she needs to schedule appt to be rechecked. Pt request cb.

## 2014-10-18 ENCOUNTER — Ambulatory Visit (INDEPENDENT_AMBULATORY_CARE_PROVIDER_SITE_OTHER): Payer: 59 | Admitting: Family Medicine

## 2014-10-18 ENCOUNTER — Encounter: Payer: Self-pay | Admitting: Family Medicine

## 2014-10-18 ENCOUNTER — Telehealth: Payer: Self-pay

## 2014-10-18 VITALS — BP 136/64 | HR 76 | Temp 97.5°F | Wt 181.8 lb

## 2014-10-18 DIAGNOSIS — F900 Attention-deficit hyperactivity disorder, predominantly inattentive type: Secondary | ICD-10-CM

## 2014-10-18 DIAGNOSIS — E559 Vitamin D deficiency, unspecified: Secondary | ICD-10-CM | POA: Insufficient documentation

## 2014-10-18 DIAGNOSIS — R5383 Other fatigue: Secondary | ICD-10-CM

## 2014-10-18 MED ORDER — LISDEXAMFETAMINE DIMESYLATE 40 MG PO CAPS: 40.0000 mg | ORAL_CAPSULE | ORAL | Status: DC

## 2014-10-18 NOTE — Assessment & Plan Note (Signed)
>  25 minutes spent in face to face time with patient, >50% spent in counselling or coordination of care Has not tried Vyvanse.  Advised she may not tolerate this well either but she would like to try it.  D/c daytrana.  Rx given to pt for vyvanse 40 mg daily. She will call me in one month with an update.

## 2014-10-18 NOTE — Progress Notes (Signed)
Subjective:   Patient ID: Courtney [name], female    DOB: 09-15-1990, 24 y.o.   MRN: [id number]  Courtney [name] is a pleasant 24 y.o. year old female who presents to clinic today with Follow-up  on 10/18/2014  HPI:  Initially saw her for this on 07/21/14.  At that time, she was complaining of several month history of fatigue that mom though started after she had to decrease the dose of her Daytrana patch due to manufacturing changes.  CBC, TSH and B12 were normal.  Vit D was a little low- 28.43 so we repleted this with Vit D 3 50,000 IU weekly x 6 weeks. Feels a little better. Has not been taking any OTC Vit D daily.  Can no longer get Daytrana patch.  Asking to try another stimulant.  Has tried and failed adderall, ritalin, concerta, and stratera.  Lab Results  Component Value Date   WBC 8.5 07/21/2014   HGB 14.5 07/21/2014   HCT 42.1 07/21/2014   MCV 88.6 07/21/2014   PLT 254.0 07/21/2014   Lab Results  Component Value Date   TSH 1.60 07/21/2014   Lab Results  Component Value Date   VITAMINB12 252 07/21/2014   Lab Results  Component Value Date   NA 138 05/20/2012   K 4.2 05/20/2012   CL 104 05/20/2012   CO2 26 05/20/2012   Current Outpatient Prescriptions on File Prior to Visit  Medication Sig Dispense Refill  . etonogestrel (NEXPLANON) 68 MG IMPL implant 1 each by Subdermal route once.    . methylphenidate (DAYTRANA) 20 MG/9HR Place 2 patches onto the skin daily. wear patch for 9 hours only each day 60 patch 0  . montelukast (SINGULAIR) 10 MG tablet Take 1 tablet (10 mg total) by mouth at bedtime. 90 tablet 3   No current facility-administered medications on file prior to visit.    No Known Allergies  Past Medical History  Diagnosis Date  . ADHD (attention deficit hyperactivity disorder)   . Chronic tonsillitis 08/2013    current strep, will finish antibiotic 09/04/2013; snores during sleep, mother denies apnea    Past Surgical History  Procedure  Laterality Date  . Wisdom tooth extraction    . Tympanoplasty Bilateral   . Tonsillectomy Bilateral 09/14/2013    Procedure: BILATERAL TONSILLECTOMY;  Surgeon: Serena Colonel, MD;  Location: [facility];  Service: ENT;  Laterality: Bilateral;    Family History  Problem Relation Age of Onset  . Hypertension Mother   . Hyperlipidemia Mother     Social History   Social History  . Marital Status: Single    Spouse Name: N/A  . Number of Children: N/A  . Years of Education: N/A   Occupational History  . Not on file.   Social History Main Topics  . Smoking status: Never Smoker   . Smokeless tobacco: Never Used  . Alcohol Use: Yes     Comment: occasionally  . Drug Use: No  . Sexual Activity: Not on file   Other Topics Concern  . Not on file   Social History Narrative   Attends [employer]- getting a degree in business and criminal justice   The PMH, PSH, Social History, Family History, Medications, and allergies have been reviewed in [facility], and have been updated if relevant.   Review of Systems  Constitutional: Positive for fatigue.  Psychiatric/Behavioral: Positive for decreased concentration. Negative for suicidal ideas, hallucinations, sleep disturbance, self-injury and dysphoric mood. The patient is  not nervous/anxious and is not hyperactive.   All other systems reviewed and are negative.      Objective:    BP 136/64 mmHg  Pulse 76  Temp(Src) 97.5 F (36.4 C) (Oral)  Wt 181 lb 12 oz (82.441 kg)  SpO2 97%   Physical Exam  Constitutional: She is oriented to person, place, and time. She appears well-developed and well-nourished. No distress.  HENT:  Head: Normocephalic.  Eyes: Conjunctivae are normal.  Neck: Normal range of motion.  Cardiovascular: Normal rate.   Pulmonary/Chest: Effort normal.  Musculoskeletal: Normal range of motion.  Neurological: She is alert and oriented to person, place, and time. No cranial nerve deficit.    Skin: Skin is warm.  Nursing note and vitals reviewed.         Assessment & Plan:   Other fatigue  Attention deficit hyperactivity disorder (ADHD), predominantly inattentive type  Vitamin D deficiency No Follow-up on file.

## 2014-10-18 NOTE — Patient Instructions (Signed)
Great to see you. Please start taking Vit D 1600 IU daily. Call me with an update when you do fill your vyvase.

## 2014-10-18 NOTE — Telephone Encounter (Signed)
pts mother (DPR signed) left v/m requesting cb about prior auth for vyvanse.

## 2014-10-18 NOTE — Progress Notes (Signed)
Pre visit review using our clinic review tool, if applicable. No additional management support is needed unless otherwise documented below in the visit note. 

## 2014-10-18 NOTE — Assessment & Plan Note (Signed)
Improved but still remains a little tired.  Vit D now in 98s- advised to take OTC Vit D 1600 IU daily. The patient indicates understanding of these issues and agrees with the plan.

## 2014-10-21 NOTE — Telephone Encounter (Signed)
PA form received and completed. Faxed back to requested party and awaiting response.

## 2014-10-25 NOTE — Telephone Encounter (Signed)
Lm on pts vm and advised 

## 2014-10-25 NOTE — Telephone Encounter (Signed)
Response received advising pts vyvanse approved and will be covered through 10/21/2017.

## 2014-11-17 ENCOUNTER — Encounter: Payer: Self-pay | Admitting: Internal Medicine

## 2014-11-17 ENCOUNTER — Ambulatory Visit (INDEPENDENT_AMBULATORY_CARE_PROVIDER_SITE_OTHER): Payer: 59 | Admitting: Internal Medicine

## 2014-11-17 VITALS — BP 114/80 | HR 79 | Temp 97.9°F | Wt 176.0 lb

## 2014-11-17 DIAGNOSIS — L02811 Cutaneous abscess of head [any part, except face]: Secondary | ICD-10-CM | POA: Diagnosis not present

## 2014-11-17 MED ORDER — SULFAMETHOXAZOLE-TRIMETHOPRIM 800-160 MG PO TABS: 1.0000 | ORAL_TABLET | Freq: Two times a day (BID) | ORAL | Status: DC

## 2014-11-17 NOTE — Progress Notes (Signed)
Pre visit review using our clinic review tool, if applicable. No additional management support is needed unless otherwise documented below in the visit note. 

## 2014-11-17 NOTE — Progress Notes (Signed)
Subjective:    Patient ID: Courtney [name], female    DOB: 07/15/1990, 24 y.o.   MRN: [id number]  HPI  Pt presents to the clinic today with c/o a lump behind her right ear. She noticed this come up about 2 weeks ago. It has gotten bigger over that time. It has drained a little. The area is tender to touch. She has noticed this lump in the past but it usually goes away on it's own. She denies fever, chills or body aches. She has tried warm compresses with minimal relief.  Review of Systems  Past Medical History  Diagnosis Date  . ADHD (attention deficit hyperactivity disorder)   . Chronic tonsillitis 08/2013    current strep, will finish antibiotic 09/04/2013; snores during sleep, mother denies apnea    Current Outpatient Prescriptions  Medication Sig Dispense Refill  . DAYTRANA 20 MG/9HR PLACE 2 PATCHES ONTO THE SKIN DAILY. WEAR PATCH FOR 9 HOURS ONLY EACH DAY  0  . etonogestrel (NEXPLANON) 68 MG IMPL implant 1 each by Subdermal route once.    . lisdexamfetamine (VYVANSE) 40 MG capsule Take 1 capsule (40 mg total) by mouth every morning. 30 capsule 0  . montelukast (SINGULAIR) 10 MG tablet Take 1 tablet (10 mg total) by mouth at bedtime. 90 tablet 3  . sulfamethoxazole-trimethoprim (BACTRIM DS,SEPTRA DS) 800-160 MG per tablet Take 1 tablet by mouth 2 (two) times daily. 20 tablet 0   No current facility-administered medications for this visit.    No Known Allergies  Family History  Problem Relation Age of Onset  . Hypertension Mother   . Hyperlipidemia Mother     Social History   Social History  . Marital Status: Single    Spouse Name: N/A  . Number of Children: N/A  . Years of Education: N/A   Occupational History  . Not on file.   Social History Main Topics  . Smoking status: Never Smoker   . Smokeless tobacco: Never Used  . Alcohol Use: Yes     Comment: occasionally  . Drug Use: No  . Sexual Activity: Not on file   Other Topics Concern  . Not on file    Social History Narrative   Attends [employer]- getting a degree in business and criminal justice     Constitutional: Denies fever, malaise, fatigue, headache or abrupt weight changes.  HEENT: Denies eye pain, eye redness, ear pain, ringing in the ears, wax buildup, runny nose, nasal congestion, bloody nose, or sore throat. Respiratory: Denies difficulty breathing, shortness of breath, cough or sputum production.   Cardiovascular: Denies chest pain, chest tightness, palpitations or swelling in the hands or feet.  Gastrointestinal: Denies abdominal pain, bloating, constipation, diarrhea or blood in the stool.  Skin: Pt reports lump behind right ear. Denies rashes, lesions or ulcercations.   No other specific complaints in a complete review of systems (except as listed in HPI above).     Objective:   Physical Exam  BP 114/80 mmHg  Pulse 79  Temp(Src) 97.9 F (36.6 C) (Oral)  Wt 176 lb (79.833 kg)  SpO2 99% Wt Readings from Last 3 Encounters:  11/17/14 176 lb (79.833 kg)  10/18/14 181 lb 12 oz (82.441 kg)  07/21/14 174 lb 12 oz (79.266 kg)    General: Appears her stated age, obese in NAD. Skin: 1 cm round abscess noted behind right ear, fluctuant. HEENT: Head: normal shape and size; Ears: Tm's gray and intact, normal light reflex;  Neck:  She has occipital lymphadenopathy noted. Cardiovascular: Normal rate and rhythm. S1,S2 noted.  No murmur, rubs or gallops noted.  Pulmonary/Chest: Normal effort and positive vesicular breath sounds. No respiratory distress. No wheezes, rales or ronchi noted.      BMET    Component Value Date/Time   NA 138 05/20/2012 1632   K 4.2 05/20/2012 1632   CL 104 05/20/2012 1632   CO2 26 05/20/2012 1632   GLUCOSE 88 05/20/2012 1632   BUN 10 05/20/2012 1632   CREATININE 0.7 05/20/2012 1632   CALCIUM 9.7 05/20/2012 1632    Lipid Panel     Component Value Date/Time   CHOL 172 02/11/2012 1431   TRIG 49.0 02/11/2012 1431    HDL 47.60 02/11/2012 1431   CHOLHDL 4 02/11/2012 1431   VLDL 9.8 02/11/2012 1431   LDLCALC 115* 02/11/2012 1431    CBC    Component Value Date/Time   WBC 8.5 07/21/2014 1256   RBC 4.75 07/21/2014 1256   HGB 14.5 07/21/2014 1256   HCT 42.1 07/21/2014 1256   PLT 254.0 07/21/2014 1256   MCV 88.6 07/21/2014 1256   MCHC 34.4 07/21/2014 1256   RDW 13.8 07/21/2014 1256   LYMPHSABS 2.4 07/21/2014 1256   MONOABS 0.6 07/21/2014 1256   EOSABS 0.2 07/21/2014 1256   BASOSABS 0.0 07/21/2014 1256    Hgb A1C No results found for: HGBA1C       Assessment & Plan:   Abscess of head:  I & D- see procedure note eRx for Septra BID x 10 days Return precautions given  Procedure note:  Discussed risk and benefits of procedure Informed consent obtained verbally Area cleansed with Betadine x 2 Area numbed with lidocaine with epi- 1 mL Area incised with # 11 blade Pus expressed Area washed with sterile water Area covered with triple antibiotic oitnment and gauze dressing Aftercare instructions given  Pt tolerated procedure well  RTC as needed or if symptoms persist or worsen

## 2014-11-17 NOTE — Patient Instructions (Signed)

## 2014-12-02 ENCOUNTER — Other Ambulatory Visit: Payer: Self-pay

## 2014-12-02 MED ORDER — LISDEXAMFETAMINE DIMESYLATE 40 MG PO CAPS: 40.0000 mg | ORAL_CAPSULE | ORAL | Status: DC

## 2014-12-02 NOTE — Telephone Encounter (Signed)
V/M left requesting rx vyvanse. Call when ready for pick up. Pt last seen and rx last printed # 30 on 10/18/14.

## 2014-12-06 MED ORDER — LISDEXAMFETAMINE DIMESYLATE 40 MG PO CAPS: 40.0000 mg | ORAL_CAPSULE | ORAL | Status: DC

## 2014-12-06 NOTE — Addendum Note (Signed)
Addended by: Desmond Dike on: 12/06/2014 09:37 AM   Modules accepted: Orders

## 2014-12-06 NOTE — Telephone Encounter (Signed)
Rx printed for signature. 

## 2014-12-06 NOTE — Telephone Encounter (Signed)
Spoke to pt and informed her Rx is available for pickup from the front desk 

## 2014-12-23 ENCOUNTER — Telehealth: Payer: Self-pay | Admitting: *Deleted

## 2014-12-23 NOTE — Telephone Encounter (Signed)
Noted.  Ok to to print out and put in my box for signature.

## 2014-12-23 NOTE — Telephone Encounter (Signed)
Pt's mother left voicemail at Triage. Mother is requesting that pt go back on the Daytrana 20 MG patches, mother said that it's covered now through insurance but once you prescribe the med we will have to do a PA on med but mother said the meds that pt are on are not helping so she wants to go back to the Daytrana patches, mother request call back

## 2014-12-27 MED ORDER — METHYLPHENIDATE 20 MG/9HR TD PTCH: 2.0000 | MEDICATED_PATCH | Freq: Every day | TRANSDERMAL | Status: DC

## 2014-12-27 NOTE — Telephone Encounter (Signed)
Rx printed and placed in Dr Aron's inbox for sig  

## 2014-12-27 NOTE — Telephone Encounter (Signed)
Lm on pts vm and informed her Rx is available for pickup from the front desk 

## 2015-02-02 ENCOUNTER — Other Ambulatory Visit: Payer: Self-pay

## 2015-02-02 MED ORDER — METHYLPHENIDATE 20 MG/9HR TD PTCH: 2.0000 | MEDICATED_PATCH | Freq: Every day | TRANSDERMAL | Status: DC

## 2015-02-02 NOTE — Telephone Encounter (Signed)
Spoke to pt and informed her Rx is available for pickup from the front desk. Pt advised third party unable to pickup 

## 2015-02-02 NOTE — Telephone Encounter (Signed)
[name] pts mom (DPR signed) left v/m requesting rx daytrana patch. Call when ready for pick up. Last printed # 60 on 12/27/14. Last seen for f/u 10/18/14.

## 2015-02-04 ENCOUNTER — Encounter: Payer: Self-pay | Admitting: Family Medicine

## 2015-02-07 ENCOUNTER — Encounter: Payer: Self-pay | Admitting: Internal Medicine

## 2015-02-07 ENCOUNTER — Ambulatory Visit (INDEPENDENT_AMBULATORY_CARE_PROVIDER_SITE_OTHER): Payer: 59 | Admitting: Internal Medicine

## 2015-02-07 VITALS — BP 110/64 | HR 76 | Temp 97.8°F | Wt 174.0 lb

## 2015-02-07 DIAGNOSIS — J329 Chronic sinusitis, unspecified: Secondary | ICD-10-CM

## 2015-02-07 DIAGNOSIS — B9789 Other viral agents as the cause of diseases classified elsewhere: Secondary | ICD-10-CM

## 2015-02-07 DIAGNOSIS — B349 Viral infection, unspecified: Secondary | ICD-10-CM

## 2015-02-07 NOTE — Progress Notes (Signed)
HPI  Pt presents to the clinic today with c/o headache, facial pain and pressure, nasal congestion and cough. This started 3 days ago. She is blowing clear mucous out of her nose. The cough is productive of light yellow mucous. She denies fever, chills or body aches. She has tried Advil cold and sinus, Singulair and Vicks with minimal relief. She has no history of allergies or breathing problems. She does not smoke.  Review of Systems    Past Medical History  Diagnosis Date  . ADHD (attention deficit hyperactivity disorder)   . Chronic tonsillitis 08/2013    current strep, will finish antibiotic 09/04/2013; snores during sleep, mother denies apnea    Family History  Problem Relation Age of Onset  . Hypertension Mother   . Hyperlipidemia Mother     Social History   Social History  . Marital Status: Single    Spouse Name: N/A  . Number of Children: N/A  . Years of Education: N/A   Occupational History  . Not on file.   Social History Main Topics  . Smoking status: Never Smoker   . Smokeless tobacco: Never Used  . Alcohol Use: Yes     Comment: occasionally  . Drug Use: No  . Sexual Activity: Not on file   Other Topics Concern  . Not on file   Social History Narrative   [employer]- getting a degree in business and criminal justice    No Known Allergies   Constitutional: Positive headache, fatigue. Denies fever or abrupt weight changes.  HEENT:  Positive eye pain, facial pain, nasal congestion and sore throat. Denies eye redness, ear pain, ringing in the ears, wax buildup, runny nose or bloody nose. Respiratory: Positive cough. Denies difficulty breathing or shortness of breath.  Cardiovascular: Denies chest pain, chest tightness, palpitations or swelling in the hands or feet.   No other specific complaints in a complete review of systems (except as listed in HPI above).  Objective:  BP 110/64 mmHg  Pulse 76  Temp(Src) 97.8 F (36.6 C)  (Oral)  Wt 174 lb (78.926 kg)  SpO2 98%   General: Appears his stated age, ill appearing in NAD. HEENT: Head: normal shape and size, no sinus tenderness noted; Eyes: sclera white, no icterus, conjunctiva pink; Ears: Tm's gray and intact, normal light reflex; Nose: mucosa boggy and moist, septum midline; Throat/Mouth: + PND. Teeth present, mucosa erythematous and moist, no exudate noted, no lesions or ulcerations noted.  Neck:  No adenopathy noted.  Cardiovascular: Normal rate and rhythm. S1,S2 noted.  No murmur, rubs or gallops noted.  Pulmonary/Chest: Normal effort and positive vesicular breath sounds. No respiratory distress. No wheezes, rales or ronchi noted.      Assessment & Plan:   Acute viral sinusitis  Can use a Neti Pot which can be purchased from your local drug store. Flonase 2 sprays each nostril for 3 days and then as needed. Start Allegra OTC Continue Singulair If symptoms persist or worsen by Thursday, will call in Augmentin BID x 10 days  RTC as needed or if symptoms persist.

## 2015-02-07 NOTE — Progress Notes (Signed)
Pre visit review using our clinic review tool, if applicable. No additional management support is needed unless otherwise documented below in the visit note. 

## 2015-02-07 NOTE — Patient Instructions (Signed)

## 2015-02-21 ENCOUNTER — Encounter: Payer: Self-pay | Admitting: Family Medicine

## 2015-03-02 ENCOUNTER — Other Ambulatory Visit: Payer: Self-pay | Admitting: Obstetrics and Gynecology

## 2015-03-03 LAB — CYTOLOGY - PAP

## 2015-03-06 DIAGNOSIS — L732 Hidradenitis suppurativa: Secondary | ICD-10-CM

## 2015-03-06 HISTORY — DX: Hidradenitis suppurativa: L73.2

## 2015-03-08 ENCOUNTER — Other Ambulatory Visit: Payer: Self-pay

## 2015-03-08 NOTE — Telephone Encounter (Signed)
Pt left v/m requesting rx daytrana patch; last printed # 60 on 02/02/15. Last seen 10/18/14.

## 2015-03-09 MED ORDER — METHYLPHENIDATE 20 MG/9HR TD PTCH: 2.0000 | MEDICATED_PATCH | Freq: Every day | TRANSDERMAL | Status: DC

## 2015-03-09 NOTE — Telephone Encounter (Signed)
RX printed and signed and given to Waynetta 

## 2015-03-09 NOTE — Telephone Encounter (Signed)
Spoke to pt and informed her Rx is available for pickup from the front desk 

## 2015-04-07 ENCOUNTER — Other Ambulatory Visit: Payer: Self-pay | Admitting: *Deleted

## 2015-04-07 NOTE — Telephone Encounter (Signed)
Last f/u 10/2014 

## 2015-04-07 NOTE — Telephone Encounter (Signed)
Ok to print and put in my box for signature. 

## 2015-04-11 MED ORDER — DAYTRANA 20 MG/9HR TD PTCH: MEDICATED_PATCH | TRANSDERMAL | Status: DC

## 2015-04-11 NOTE — Telephone Encounter (Signed)
Lm on pts vm informing her Rx is available for pickup from the front desk 

## 2015-04-21 ENCOUNTER — Ambulatory Visit (INDEPENDENT_AMBULATORY_CARE_PROVIDER_SITE_OTHER): Payer: 59 | Admitting: Family Medicine

## 2015-04-21 VITALS — BP 116/62 | HR 84 | Temp 97.5°F | Wt 180.5 lb

## 2015-04-21 DIAGNOSIS — H6592 Unspecified nonsuppurative otitis media, left ear: Secondary | ICD-10-CM | POA: Diagnosis not present

## 2015-04-21 MED ORDER — AMOXICILLIN-POT CLAVULANATE 875-125 MG PO TABS: 1.0000 | ORAL_TABLET | Freq: Two times a day (BID) | ORAL | Status: AC

## 2015-04-21 NOTE — Progress Notes (Signed)
SUBJECTIVE: Courtney [name] is a 25 y.o. female  with 5 day(s) history of pain  at left ear, and coryza and congestion. Temperature unknown at home.   Current Outpatient Prescriptions on File Prior to Visit  Medication Sig Dispense Refill  . etonogestrel (NEXPLANON) 68 MG IMPL implant 1 each by Subdermal route once.    . methylphenidate (DAYTRANA) 20 MG/9HR Place 2 patches onto the skin daily. wear patch for 9 hours only each day 60 patch 0  . montelukast (SINGULAIR) 10 MG tablet Take 1 tablet (10 mg total) by mouth at bedtime. 90 tablet 3   No current facility-administered medications on file prior to visit.    No Known Allergies  Past Medical History  Diagnosis Date  . ADHD (attention deficit hyperactivity disorder)   . Chronic tonsillitis 08/2013    current strep, will finish antibiotic 09/04/2013; snores during sleep, mother denies apnea    Past Surgical History  Procedure Laterality Date  . Wisdom tooth extraction    . Tympanoplasty Bilateral   . Tonsillectomy Bilateral 09/14/2013    Procedure: BILATERAL TONSILLECTOMY;  Surgeon: Serena Colonel, MD;  Location: [facility];  Service: ENT;  Laterality: Bilateral;    Family History  Problem Relation Age of Onset  . Hypertension Mother   . Hyperlipidemia Mother     Social History   Social History  . Marital Status: Single    Spouse Name: N/A  . Number of Children: N/A  . Years of Education: N/A   Occupational History  . Not on file.   Social History Main Topics  . Smoking status: Never Smoker   . Smokeless tobacco: Never Used  . Alcohol Use: Yes     Comment: occasionally  . Drug Use: No  . Sexual Activity: Not on file   Other Topics Concern  . Not on file   Social History Narrative   Attends [employer]- getting a degree in business and criminal justice   The PMH, PSH, Social History, Family History, Medications, and allergies have been reviewed in [facility], and have been updated if  relevant.  OBJECTIVE: BP 116/62 mmHg  Pulse 84  Temp(Src) 97.5 F (36.4 C) (Oral)  Wt 180 lb 8 oz (81.874 kg)  SpO2 98% General appearance: alert, well appearing, and in no distress and oriented to person, place, and time.   Ears: left TM red, dull, bulging, left external canal inflamed Nose: normal and patent, no erythema, discharge or polyps Oropharynx: mucous membranes moist, pharynx normal without lesions Neck: supple, no significant adenopathy Lungs: clear to auscultation, no wheezes, rales or rhonchi, symmetric air entry  ASSESSMENT: Otitis Media  PLAN: 1) See orders for this visit as documented in the electronic medical record.- Augmentin twice daily x 10 days. 2) Symptomatic therapy suggested: use acetaminophen, ibuprofen prn.  3) Call or return to clinic prn if these symptoms worsen or fail to improve as anticipated.

## 2015-04-21 NOTE — Patient Instructions (Signed)

## 2015-04-21 NOTE — Progress Notes (Signed)
Pre visit review using our clinic review tool, if applicable. No additional management support is needed unless otherwise documented below in the visit note. 

## 2015-04-28 ENCOUNTER — Telehealth: Payer: Self-pay | Admitting: Family Medicine

## 2015-04-28 NOTE — Telephone Encounter (Signed)
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  Patient will pick up records.  Please call patient when records are ready.

## 2015-04-29 NOTE — Telephone Encounter (Signed)
Spoke to pt and informed her immunization record is available for pickup from the front desk. Pt advised that registry did not reflect all immunizations, and she may want to contact her childhood provider to obtain remaining.

## 2015-05-10 ENCOUNTER — Other Ambulatory Visit: Payer: Self-pay | Admitting: Family Medicine

## 2015-05-11 ENCOUNTER — Encounter: Payer: Self-pay | Admitting: Family Medicine

## 2015-05-11 ENCOUNTER — Ambulatory Visit (INDEPENDENT_AMBULATORY_CARE_PROVIDER_SITE_OTHER): Payer: 59 | Admitting: Family Medicine

## 2015-05-11 VITALS — BP 116/80 | HR 95 | Temp 97.3°F | Wt 180.5 lb

## 2015-05-11 DIAGNOSIS — J0110 Acute frontal sinusitis, unspecified: Secondary | ICD-10-CM | POA: Diagnosis not present

## 2015-05-11 MED ORDER — AMOXICILLIN-POT CLAVULANATE 875-125 MG PO TABS: 1.0000 | ORAL_TABLET | Freq: Two times a day (BID) | ORAL | Status: AC

## 2015-05-11 NOTE — Patient Instructions (Signed)
Take antibiotic as directed.  Drink lots of fluids.    Treat sympotmatically with Mucinex, nasal saline irrigation, and Tylenol/Ibuprofen.   Also try an antihistamine/decongestant like claritin D or zyrtec D over the counter- two times a day as needed ( have to sign for them at pharmacy).   Try over the counter nasocort-start with 2 sprays per nostril per day...and then try to taper to 1 spray per nostril once symptoms improve.   You can use warm compresses.    Call if not improving as expected in 5-7 days.    

## 2015-05-11 NOTE — Progress Notes (Signed)
Pre visit review using our clinic review tool, if applicable. No additional management support is needed unless otherwise documented below in the visit note. 

## 2015-05-11 NOTE — Progress Notes (Signed)
SUBJECTIVE:  Courtney ApleyBrandy L [name] is a 25 y.o. female who complains of coryza, congestion and bilateral sinus pain for 8 days. She denies a history of anorexia and chest pain and denies a history of asthma. Patient denies smoke cigarettes.   Current Outpatient Prescriptions on File Prior to Visit  Medication Sig Dispense Refill  . etonogestrel (NEXPLANON) 68 MG IMPL implant 1 each by Subdermal route once.    . methylphenidate (DAYTRANA) 20 MG/9HR Place 2 patches onto the skin daily. wear patch for 9 hours only each day 60 patch 0  . montelukast (SINGULAIR) 10 MG tablet TAKE 1 TABLET AT BEDTIME 90 tablet 2   No current facility-administered medications on file prior to visit.    No Known Allergies  Past Medical History  Diagnosis Date  . ADHD (attention deficit hyperactivity disorder)   . Chronic tonsillitis 08/2013    current strep, will finish antibiotic 09/04/2013; snores during sleep, mother denies apnea    Past Surgical History  Procedure Laterality Date  . Wisdom tooth extraction    . Tympanoplasty Bilateral   . Tonsillectomy Bilateral 09/14/2013    Procedure: BILATERAL TONSILLECTOMY;  Surgeon: Serena ColonelJefry Rosen, MD;  Location: [facility];  Service: ENT;  Laterality: Bilateral;    Family History  Problem Relation Age of Onset  . Hypertension Mother   . Hyperlipidemia Mother     Social History   Social History  . Marital Status: Single    Spouse Name: N/A  . Number of Children: N/A  . Years of Education: N/A   Occupational History  . Not on file.   Social History Main Topics  . Smoking status: Never Smoker   . Smokeless tobacco: Never Used  . Alcohol Use: Yes     Comment: occasionally  . Drug Use: No  . Sexual Activity: Not on file   Other Topics Concern  . Not on file   Social History Narrative   Attends [employer]- getting a degree in business and criminal justice   The PMH, PSH, Social History, Family History, Medications, and  allergies [facility], and have been updated if relevant.  OBJECTIVE: BP 116/80 mmHg  Pulse 95  Temp(Src) 97.3 F (36.3 C) (Oral)  Wt 180 lb 8 oz (81.874 kg)  SpO2 98%  She appears well, vital signs are as noted. Ears normal.  Throat and pharynx normal.  Neck supple. No adenopathy in the neck. Nose is congested. Sinuses  tender. The chest is clear, without wheezes or rales.  ASSESSMENT:  sinusitis  PLAN: Given duration and progression of symptoms, will treat for bacterial sinusitis with Augmention. Symptomatic therapy suggested: push fluids, rest and return office visit prn if symptoms persist or worsen. Call or return to clinic prn if these symptoms worsen or fail to improve as anticipated.

## 2015-05-16 ENCOUNTER — Other Ambulatory Visit: Payer: Self-pay

## 2015-05-16 MED ORDER — METHYLPHENIDATE 20 MG/9HR TD PTCH: 2.0000 | MEDICATED_PATCH | Freq: Every day | TRANSDERMAL | Status: DC

## 2015-05-16 NOTE — Telephone Encounter (Signed)
V/M left requesting Daytrana patch; call when ready for pick up. Last printed # 60 on 03/09/15. Last f/u 10/18/14.

## 2015-05-16 NOTE — Telephone Encounter (Signed)
Spoke to pt and informed her Rx is available for pickup from the front desk 

## 2015-06-16 ENCOUNTER — Telehealth: Payer: Self-pay | Admitting: Family Medicine

## 2015-06-16 MED ORDER — METHYLPHENIDATE 20 MG/9HR TD PTCH: 2.0000 | MEDICATED_PATCH | Freq: Every day | TRANSDERMAL | Status: DC

## 2015-06-16 NOTE — Telephone Encounter (Signed)
Rx printed

## 2015-06-16 NOTE — Telephone Encounter (Signed)
Last seen for ADHD 10-18-14

## 2015-06-16 NOTE — Telephone Encounter (Signed)
Spoke to patient. Rx up front. 

## 2015-06-16 NOTE — Telephone Encounter (Signed)
Mom came in stating pt needs a refill on  daytrana patches  please let pt know when ready for pick

## 2015-07-22 ENCOUNTER — Other Ambulatory Visit: Payer: Self-pay

## 2015-07-22 NOTE — Telephone Encounter (Signed)
Ok to print and put on my desk for signature. 

## 2015-07-22 NOTE — Telephone Encounter (Signed)
pts mom left v/m requesting rx Daytrana patches. Call pt when ready for pick up. Last printed # 60 on 06/16/15; last f/u appt on 10/18/14.

## 2015-07-25 MED ORDER — METHYLPHENIDATE 20 MG/9HR TD PTCH: 2.0000 | MEDICATED_PATCH | Freq: Every day | TRANSDERMAL | Status: DC

## 2015-07-25 NOTE — Telephone Encounter (Signed)
Lm on pts vm and informed her Rx is available for pickup at the front desk 

## 2015-08-23 ENCOUNTER — Other Ambulatory Visit: Payer: Self-pay

## 2015-08-23 MED ORDER — METHYLPHENIDATE 20 MG/9HR TD PTCH: 2.0000 | MEDICATED_PATCH | Freq: Every day | TRANSDERMAL | Status: DC

## 2015-08-23 NOTE — Telephone Encounter (Signed)
V/M left requesting rx Daytrana patch. Call when ready for pick up.last printed # 60 on 07/25/15. Last seen f/u 10/18/14; no future appt scheduled.

## 2015-08-23 NOTE — Telephone Encounter (Signed)
Spoke to pt and informed her Rx is available for pickup from the front desk 

## 2015-09-29 ENCOUNTER — Other Ambulatory Visit: Payer: Self-pay

## 2015-09-29 MED ORDER — METHYLPHENIDATE 20 MG/9HR TD PTCH: 2.0000 | MEDICATED_PATCH | Freq: Every day | TRANSDERMAL | 0 refills | Status: DC

## 2015-09-29 NOTE — Telephone Encounter (Signed)
V/M left requesting rx for Daytrana patch; call when ready for pick up. Last printed # 60 on 06/*20/17. Last f/u 10/18/14.no future appt scheduled.

## 2015-09-29 NOTE — Telephone Encounter (Signed)
Spoke to pt and informed her Rx is available for pickup from the front desk 

## 2015-09-30 ENCOUNTER — Encounter: Payer: Self-pay | Admitting: Family Medicine

## 2015-10-31 ENCOUNTER — Other Ambulatory Visit: Payer: Self-pay

## 2015-10-31 MED ORDER — METHYLPHENIDATE 20 MG/9HR TD PTCH: 2.0000 | MEDICATED_PATCH | Freq: Every day | TRANSDERMAL | 0 refills | Status: DC

## 2015-10-31 NOTE — Telephone Encounter (Signed)
Pt left v/m requesting Daytrana patch; last printed # 60 on 09/29/15; last seen f/u 10/18/14 and last acute 05/11/15; no future appt scheduled.

## 2015-10-31 NOTE — Telephone Encounter (Signed)
Spoke to pt and informed her Rx is available for pickup from front desk 

## 2015-11-29 ENCOUNTER — Other Ambulatory Visit: Payer: Self-pay

## 2015-11-29 MED ORDER — METHYLPHENIDATE 20 MG/9HR TD PTCH: 2.0000 | MEDICATED_PATCH | Freq: Every day | TRANSDERMAL | 0 refills | Status: DC

## 2015-11-29 NOTE — Telephone Encounter (Signed)
V/M left requesting rx daytrana patches. Call whien ready for pick up. Last printed # 60 on 10/31/15 and last f/u 10/18/14; last acute 05/11/15. No future appt scheduled.

## 2015-11-30 MED ORDER — METHYLPHENIDATE 20 MG/9HR TD PTCH: 2.0000 | MEDICATED_PATCH | Freq: Every day | TRANSDERMAL | 0 refills | Status: DC

## 2015-11-30 NOTE — Telephone Encounter (Signed)
Spoke to pt and informed her Rx is available for pickup from the front desk 

## 2015-11-30 NOTE — Addendum Note (Signed)
Addended by: Desmond DikeKNIGHT, Ashante Yellin H on: 11/30/2015 12:11 PM   Modules accepted: Orders

## 2016-01-02 ENCOUNTER — Emergency Department (HOSPITAL_COMMUNITY)
Admission: EM | Admit: 2016-01-02 | Discharge: 2016-01-03 | Disposition: A | Discharge status: Home / Self Care | Payer: 59 | Attending: Emergency Medicine | Admitting: Emergency Medicine

## 2016-01-02 ENCOUNTER — Encounter (HOSPITAL_COMMUNITY): Payer: Self-pay | Admitting: Emergency Medicine

## 2016-01-02 ENCOUNTER — Other Ambulatory Visit: Payer: Self-pay

## 2016-01-02 DIAGNOSIS — Z79899 Other long term (current) drug therapy: Secondary | ICD-10-CM | POA: Diagnosis not present

## 2016-01-02 DIAGNOSIS — F909 Attention-deficit hyperactivity disorder, unspecified type: Secondary | ICD-10-CM | POA: Diagnosis not present

## 2016-01-02 DIAGNOSIS — X58XXXA Exposure to other specified factors, initial encounter: Secondary | ICD-10-CM | POA: Insufficient documentation

## 2016-01-02 DIAGNOSIS — T781XXA Other adverse food reactions, not elsewhere classified, initial encounter: Secondary | ICD-10-CM | POA: Diagnosis present

## 2016-01-02 MED ORDER — SODIUM CHLORIDE 0.9 % IV BOLUS (SEPSIS)
1000.0000 mL | Freq: Once | INTRAVENOUS | Status: AC
  Administered 2016-01-02: 1000 mL via INTRAVENOUS

## 2016-01-02 MED ORDER — FAMOTIDINE IN NACL 20-0.9 MG/50ML-% IV SOLN
20.0000 mg | Freq: Once | INTRAVENOUS | Status: AC
  Administered 2016-01-02: 20 mg via INTRAVENOUS
  Filled 2016-01-02: qty 50

## 2016-01-02 MED ORDER — METHYLPREDNISOLONE SODIUM SUCC 125 MG IJ SOLR
125.0000 mg | Freq: Once | INTRAMUSCULAR | Status: AC
  Administered 2016-01-02: 125 mg via INTRAVENOUS
  Filled 2016-01-02: qty 2

## 2016-01-02 MED ORDER — METHYLPHENIDATE 20 MG/9HR TD PTCH: 2.0000 | MEDICATED_PATCH | Freq: Every day | TRANSDERMAL | 0 refills | Status: DC

## 2016-01-02 NOTE — Telephone Encounter (Signed)
Spoke to pt and informed her Rx is available for pickup from the front desk 

## 2016-01-02 NOTE — Telephone Encounter (Signed)
V/M left requesting rx daytrana patches. Call when ready for pick up. Last printed #60 on 11/30/15. Last f/u 10/18/14.no future appt scheduled.

## 2016-01-02 NOTE — ED Provider Notes (Signed)
[facility]-EMERGENCY DEPT Provider Note   CSN: [id number] Arrival date & time: 01/02/16  2000     History   Chief Complaint Chief Complaint  Patient presents with  . Allergic Reaction    HPI Courtney [name] is a 25 y.o. female.  HPI  25 y.o. female presents to the Emergency Department today due to possible allergic reaction. Pt states that round 6:30pm she ate shrimp. States that she has no documented allergy, but notes her mother has a strong allergy. Pt preemptively took benadryl before eating shrimp. Notes throat itching s/p ingestion and decided to come to ED. No hives. No urticarial rash. No N/V. No CP/SOB/ABD pain. No numbness/tingling. No other symptoms noted.   Past Medical History:  Diagnosis Date  . ADHD (attention deficit hyperactivity disorder)   . Chronic tonsillitis 08/2013   current strep, will finish antibiotic 09/04/2013; snores during sleep, mother denies apnea    Patient Active Problem List   Diagnosis Date Noted  . Vitamin D deficiency 10/18/2014  . Fatigue 07/21/2014  . S/P tonsillectomy 09/14/2013  . Internal nasal lesion 08/21/2012  . ADHD (attention deficit hyperactivity disorder) 02/11/2012    Past Surgical History:  Procedure Laterality Date  . TONSILLECTOMY Bilateral 09/14/2013   Procedure: BILATERAL TONSILLECTOMY;  Surgeon: Serena ColonelJefry Rosen, MD;  Location: [facility];  Service: ENT;  Laterality: Bilateral;  . TYMPANOPLASTY Bilateral   . WISDOM TOOTH EXTRACTION      OB History    No data available       Home Medications    Prior to Admission medications   Medication Sig Start Date End Date Taking? Authorizing Provider  Adapalene-Benzoyl Peroxide (EPIDUO FORTE) 0.3-2.5 % GEL Apply topically.    Historical Provider, MD  Dapsone (ACZONE) 7.5 % GEL Apply topically.    Historical Provider, MD  doxycycline (VIBRA-TABS) 100 MG tablet Take 100 mg by mouth 2 (two) times daily. For acne    Historical Provider, MD  etonogestrel (NEXPLANON) 68  MG IMPL implant 1 each by Subdermal route once.    Historical Provider, MD  methylphenidate (DAYTRANA) 20 MG/9HR Place 2 patches onto the skin daily. wear patch for 9 hours only each day 01/02/16   Dianne Dunalia M Aron, MD  montelukast (SINGULAIR) 10 MG tablet TAKE 1 TABLET AT BEDTIME 05/10/15   Dianne Dunalia M Aron, MD    Family History Family History  Problem Relation Age of Onset  . Hypertension Mother   . Hyperlipidemia Mother     Social History Social History  Substance Use Topics  . Smoking status: Never Smoker  . Smokeless tobacco: Never Used  . Alcohol use Yes     Comment: occasionally     Allergies   Review of patient's allergies indicates no known allergies.   Review of Systems Review of Systems ROS reviewed and all are negative for acute change except as noted in the HPI.  Physical Exam Updated Vital Signs BP 126/86 (BP Location: Right Arm)   Pulse 105   Temp 97.6 F (36.4 C) (Oral)   Resp 19   Ht 5' (1.524 m)   Wt 77.1 kg   SpO2 97%   BMI 33.20 kg/m   Physical Exam  Constitutional: She is oriented to person, place, and time. Vital signs are normal. She appears well-developed and well-nourished.  HENT:  Head: Normocephalic.  Right Ear: Hearing normal.  Left Ear: Hearing normal.  Mouth/Throat: Uvula is midline, oropharynx is clear and moist and mucous membranes are normal. No oropharyngeal exudate, posterior  oropharyngeal edema, posterior oropharyngeal erythema or tonsillar abscesses.  No Stridor. Phonating well.   Eyes: Conjunctivae and EOM are normal. Pupils are equal, round, and reactive to light.  Neck: Normal range of motion. Neck supple.  Cardiovascular: Regular rhythm, normal heart sounds and intact distal pulses.  Tachycardia present.   Pulmonary/Chest: Effort normal and breath sounds normal.  Neurological: She is alert and oriented to person, place, and time.  Skin: Skin is warm and dry.  Psychiatric: She has a normal mood and affect. Her speech is normal and  behavior is normal. Thought content normal.  Nursing note and vitals reviewed.  ED Treatments / Results  Labs (all labs ordered are listed, but only abnormal results are displayed) Labs Reviewed - No data to display  EKG  EKG Interpretation None      Radiology No results found.  Procedures Procedures (including critical care time)  Medications Ordered in ED Medications  methylPREDNISolone sodium succinate (SOLU-MEDROL) 125 mg/2 mL injection 125 mg (not administered)  famotidine (PEPCID) IVPB 20 mg premix (not administered)  sodium chloride 0.9 % bolus 1,000 mL (not administered)   Initial Impression / Assessment and Plan / ED Course  I have reviewed the triage vital signs and the nursing notes.  Pertinent labs & imaging results that were available during my care of the patient were reviewed by me and considered in my medical decision making (see chart for details).  Clinical Course   Final Clinical Impressions(s) / ED Diagnoses  I have reviewed the relevant previous healthcare records. I obtained HPI from historian.  ED Course:  Assessment: Pt is a 25yF who presents with possible allergic reaction. Ate shrimp without documented allergy. Notes throat felt funny Took Benadryl preemptively prior to ingestion. On exam, pt in NAD. Nontoxic/nonseptic appearing. VSS. Slight tachycardia. Afebrile. Lungs CTA. Abdomen nontender soft. Posterior oropharynx clear. Phonating well. No obvious swelling. No stridor. Given Solumedrol and Famotidine. Observed for 6 hours without reoccurrence of symptoms. Plan is to DC Home with follow up to allergist. At time of discharge, Patient is in no acute distress. Vital Signs are stable. Patient is able to ambulate. Patient able to tolerate PO.    Disposition/Plan:  DC Home Additional Verbal discharge instructions given and discussed with patient.  Pt Instructed to f/u with Allergist in the next week for evaluation and treatment of symptoms. Return  precautions given Pt acknowledges and agrees with plan  Supervising Physician Lyndal Pulleyaniel Knott, MD   Final diagnoses:  Allergic reaction to food, initial encounter    New Prescriptions New Prescriptions   No medications on file     Audry Piliyler Antone Summons, PA-C 01/03/16 0009    Lyndal Pulleyaniel Knott, MD 01/03/16 [phone]

## 2016-01-02 NOTE — ED Notes (Signed)
Bed: WA02 Expected date:  Expected time:  Means of arrival:  Comments: 25 yr old allergic reaction/shrimp

## 2016-01-02 NOTE — ED Triage Notes (Signed)
She took benadryl 25mg   Before ingestion knowing she has known allergy. Started feeling funny, took another 25mg  benadryl and called ems.   V/s on arrival 128/97, hr 114, spo2 98 room air, rr16. Hx of ADD.   Reports tingling of tongue and hard to swallow.  Verbalized SOB.

## 2016-01-02 NOTE — ED Notes (Signed)
Pt stated "I've been eating shrimp but it has been making my tongue itch.  My tongue was tingley."

## 2016-01-02 NOTE — ED Notes (Signed)
Pt provided Sprite & mac n' cheese bucket.

## 2016-01-03 ENCOUNTER — Ambulatory Visit (INDEPENDENT_AMBULATORY_CARE_PROVIDER_SITE_OTHER): Payer: 59

## 2016-01-03 DIAGNOSIS — Z23 Encounter for immunization: Secondary | ICD-10-CM | POA: Diagnosis not present

## 2016-01-03 MED ORDER — EPINEPHRINE 0.3 MG/0.3ML IJ SOAJ: 0.3000 mg | Freq: Once | INTRAMUSCULAR | 0 refills | Status: AC

## 2016-01-03 NOTE — Discharge Instructions (Signed)
Please read and follow all provided instructions.  Your diagnoses today include:  1. Allergic reaction to food, initial encounter     Tests performed today include: Vital signs. See below for your results today.   Medications prescribed:  Take as prescribed   Home care instructions:  Follow any educational materials contained in this packet.  Follow-up instructions: Please follow-up with an Allergist for further evaluation of symptoms and treatment   Return instructions:  Please return to the Emergency Department if you do not get better, if you get worse, or new symptoms OR  - Fever (temperature greater than 101.52F)  - Bleeding that does not stop with holding pressure to the area    -Severe pain (please note that you may be more sore the day after your accident)  - Chest Pain  - Difficulty breathing  - Severe nausea or vomiting  - Inability to tolerate food and liquids  - Passing out  - Skin becoming red around your wounds  - Change in mental status (confusion or lethargy)  - New numbness or weakness    Please return if you have any other emergent concerns.  Additional Information:  Your vital signs today were: BP 120/86 (BP Location: Right Arm)    Pulse 85    Temp 97.6 F (36.4 C) (Oral)    Resp 18    Ht 5' (1.524 m)    Wt 77.1 kg    SpO2 94%    BMI 33.20 kg/m  If your blood pressure (BP) was elevated above 135/85 this visit, please have this repeated by your doctor within one month. ---------------

## 2016-02-04 ENCOUNTER — Other Ambulatory Visit: Payer: Self-pay | Admitting: Family Medicine

## 2016-02-07 ENCOUNTER — Other Ambulatory Visit: Payer: Self-pay

## 2016-02-07 MED ORDER — METHYLPHENIDATE 20 MG/9HR TD PTCH: 2.0000 | MEDICATED_PATCH | Freq: Every day | TRANSDERMAL | 0 refills | Status: DC

## 2016-02-07 NOTE — Telephone Encounter (Signed)
Spoke to pt and informed her Rx is available for pickup from the front desk 

## 2016-02-07 NOTE — Telephone Encounter (Signed)
V/M left requesting rx Daytrana patch. Call when ready for pick up. Last printed # 60 on 01/02/16. Last f/u 10/18/14. No future appt scheduled.

## 2016-02-20 ENCOUNTER — Telehealth: Payer: Self-pay | Admitting: *Deleted

## 2016-02-20 NOTE — Telephone Encounter (Signed)
Patient came in office to drop off a [employer] letter regarding drug testing. It will be in your box. Thank you.

## 2016-03-01 ENCOUNTER — Telehealth: Payer: Self-pay

## 2016-03-01 NOTE — Telephone Encounter (Signed)
pts mom (DPR signed) left v/m requesting cb. [facility] pt could get 3 month supply of daytrana patches at [facility]/ pts mom wants to know if something has changed where can get 3 month rx for daytrana patch.last seen ADHD on 10/18/14. Pt does not need refill of daytrana patch now.

## 2016-03-07 ENCOUNTER — Other Ambulatory Visit: Payer: Self-pay

## 2016-03-07 DIAGNOSIS — J019 Acute sinusitis, unspecified: Secondary | ICD-10-CM | POA: Diagnosis not present

## 2016-03-07 MED ORDER — METHYLPHENIDATE 20 MG/9HR TD PTCH: 2.0000 | MEDICATED_PATCH | Freq: Every day | TRANSDERMAL | 0 refills | Status: DC

## 2016-03-07 NOTE — Telephone Encounter (Signed)
Pt left v/m requesting rx for Daytrana patches. Last printed # 60 on 02/07/16 and last seen for F/U 10/18/14. No future appt scheduled.

## 2016-03-08 DIAGNOSIS — L910 Hypertrophic scar: Secondary | ICD-10-CM | POA: Diagnosis not present

## 2016-03-08 DIAGNOSIS — L723 Sebaceous cyst: Secondary | ICD-10-CM | POA: Diagnosis not present

## 2016-03-08 DIAGNOSIS — L709 Acne, unspecified: Secondary | ICD-10-CM | POA: Diagnosis not present

## 2016-03-08 MED ORDER — METHYLPHENIDATE 20 MG/9HR TD PTCH: 2.0000 | MEDICATED_PATCH | Freq: Every day | TRANSDERMAL | 0 refills | Status: DC

## 2016-03-08 NOTE — Addendum Note (Signed)
Addended by: Adiva Boettner H on: 03/08/2016 08:12 AM   Modules accepted: Orders  

## 2016-03-08 NOTE — Telephone Encounter (Signed)
Lm on pts vm and informed her Rx is available for pickup from the front desk 

## 2016-03-12 ENCOUNTER — Encounter: Payer: Self-pay | Admitting: Family Medicine

## 2016-03-12 DIAGNOSIS — Z79899 Other long term (current) drug therapy: Secondary | ICD-10-CM | POA: Diagnosis not present

## 2016-03-20 DIAGNOSIS — Z01419 Encounter for gynecological examination (general) (routine) without abnormal findings: Secondary | ICD-10-CM | POA: Diagnosis not present

## 2016-03-20 DIAGNOSIS — Z124 Encounter for screening for malignant neoplasm of cervix: Secondary | ICD-10-CM | POA: Diagnosis not present

## 2016-03-20 DIAGNOSIS — J3089 Other allergic rhinitis: Secondary | ICD-10-CM | POA: Diagnosis not present

## 2016-03-20 DIAGNOSIS — J3081 Allergic rhinitis due to animal (cat) (dog) hair and dander: Secondary | ICD-10-CM | POA: Diagnosis not present

## 2016-03-20 DIAGNOSIS — J301 Allergic rhinitis due to pollen: Secondary | ICD-10-CM | POA: Diagnosis not present

## 2016-03-26 DIAGNOSIS — J301 Allergic rhinitis due to pollen: Secondary | ICD-10-CM | POA: Diagnosis not present

## 2016-03-26 DIAGNOSIS — J3081 Allergic rhinitis due to animal (cat) (dog) hair and dander: Secondary | ICD-10-CM | POA: Diagnosis not present

## 2016-03-26 DIAGNOSIS — J3089 Other allergic rhinitis: Secondary | ICD-10-CM | POA: Diagnosis not present

## 2016-04-05 DIAGNOSIS — R05 Cough: Secondary | ICD-10-CM | POA: Diagnosis not present

## 2016-04-10 ENCOUNTER — Other Ambulatory Visit: Payer: Self-pay

## 2016-04-10 NOTE — Telephone Encounter (Signed)
V/M left requesting rx daytrana patches. Call when ready for pick up. Last printed # 60 on 03/08/16. Last f/u 10/18/14 but has been seen several times for acute visits since f/u. No future appt. Scheduled.

## 2016-04-10 NOTE — Telephone Encounter (Signed)
Ok to print out and put on my desk for signature. 

## 2016-04-11 MED ORDER — METHYLPHENIDATE 20 MG/9HR TD PTCH: 2.0000 | MEDICATED_PATCH | Freq: Every day | TRANSDERMAL | 0 refills | Status: DC

## 2016-04-11 NOTE — Telephone Encounter (Signed)
Spoke to pt and informed her Rx is available for pickup from the front desk 

## 2016-04-12 DIAGNOSIS — J3089 Other allergic rhinitis: Secondary | ICD-10-CM | POA: Diagnosis not present

## 2016-04-12 DIAGNOSIS — J3081 Allergic rhinitis due to animal (cat) (dog) hair and dander: Secondary | ICD-10-CM | POA: Diagnosis not present

## 2016-04-12 DIAGNOSIS — J301 Allergic rhinitis due to pollen: Secondary | ICD-10-CM | POA: Diagnosis not present

## 2016-04-24 DIAGNOSIS — J3081 Allergic rhinitis due to animal (cat) (dog) hair and dander: Secondary | ICD-10-CM | POA: Diagnosis not present

## 2016-04-24 DIAGNOSIS — J301 Allergic rhinitis due to pollen: Secondary | ICD-10-CM | POA: Diagnosis not present

## 2016-04-24 DIAGNOSIS — J3089 Other allergic rhinitis: Secondary | ICD-10-CM | POA: Diagnosis not present

## 2016-04-26 DIAGNOSIS — J301 Allergic rhinitis due to pollen: Secondary | ICD-10-CM | POA: Diagnosis not present

## 2016-04-26 DIAGNOSIS — J3081 Allergic rhinitis due to animal (cat) (dog) hair and dander: Secondary | ICD-10-CM | POA: Diagnosis not present

## 2016-04-26 DIAGNOSIS — J3089 Other allergic rhinitis: Secondary | ICD-10-CM | POA: Diagnosis not present

## 2016-05-03 DIAGNOSIS — J3081 Allergic rhinitis due to animal (cat) (dog) hair and dander: Secondary | ICD-10-CM | POA: Diagnosis not present

## 2016-05-03 DIAGNOSIS — J3089 Other allergic rhinitis: Secondary | ICD-10-CM | POA: Diagnosis not present

## 2016-05-03 DIAGNOSIS — J301 Allergic rhinitis due to pollen: Secondary | ICD-10-CM | POA: Diagnosis not present

## 2016-05-07 ENCOUNTER — Other Ambulatory Visit: Payer: Self-pay | Admitting: *Deleted

## 2016-05-07 DIAGNOSIS — J3081 Allergic rhinitis due to animal (cat) (dog) hair and dander: Secondary | ICD-10-CM | POA: Diagnosis not present

## 2016-05-07 DIAGNOSIS — L723 Sebaceous cyst: Secondary | ICD-10-CM | POA: Diagnosis not present

## 2016-05-07 DIAGNOSIS — J3089 Other allergic rhinitis: Secondary | ICD-10-CM | POA: Diagnosis not present

## 2016-05-07 DIAGNOSIS — J301 Allergic rhinitis due to pollen: Secondary | ICD-10-CM | POA: Diagnosis not present

## 2016-05-07 MED ORDER — METHYLPHENIDATE 20 MG/9HR TD PTCH: 2.0000 | MEDICATED_PATCH | Freq: Every day | TRANSDERMAL | 0 refills | Status: DC

## 2016-05-07 NOTE — Telephone Encounter (Signed)
Ok to refill? Last filled 04/11/16 #60 0RF. Please call patient at [phone] when ready.

## 2016-05-07 NOTE — Telephone Encounter (Signed)
Spoke to pt. Rx up front ready for pickup 

## 2016-05-09 DIAGNOSIS — Z79899 Other long term (current) drug therapy: Secondary | ICD-10-CM | POA: Diagnosis not present

## 2016-05-09 DIAGNOSIS — L700 Acne vulgaris: Secondary | ICD-10-CM | POA: Diagnosis not present

## 2016-05-09 DIAGNOSIS — Z5181 Encounter for therapeutic drug level monitoring: Secondary | ICD-10-CM | POA: Diagnosis not present

## 2016-05-09 DIAGNOSIS — J3081 Allergic rhinitis due to animal (cat) (dog) hair and dander: Secondary | ICD-10-CM | POA: Diagnosis not present

## 2016-05-09 DIAGNOSIS — J301 Allergic rhinitis due to pollen: Secondary | ICD-10-CM | POA: Diagnosis not present

## 2016-05-09 DIAGNOSIS — L709 Acne, unspecified: Secondary | ICD-10-CM | POA: Diagnosis not present

## 2016-05-09 DIAGNOSIS — J3089 Other allergic rhinitis: Secondary | ICD-10-CM | POA: Diagnosis not present

## 2016-05-11 DIAGNOSIS — J301 Allergic rhinitis due to pollen: Secondary | ICD-10-CM | POA: Diagnosis not present

## 2016-05-11 DIAGNOSIS — J3081 Allergic rhinitis due to animal (cat) (dog) hair and dander: Secondary | ICD-10-CM | POA: Diagnosis not present

## 2016-05-11 DIAGNOSIS — J3089 Other allergic rhinitis: Secondary | ICD-10-CM | POA: Diagnosis not present

## 2016-05-18 DIAGNOSIS — J3089 Other allergic rhinitis: Secondary | ICD-10-CM | POA: Diagnosis not present

## 2016-05-18 DIAGNOSIS — J3081 Allergic rhinitis due to animal (cat) (dog) hair and dander: Secondary | ICD-10-CM | POA: Diagnosis not present

## 2016-05-18 DIAGNOSIS — J301 Allergic rhinitis due to pollen: Secondary | ICD-10-CM | POA: Diagnosis not present

## 2016-05-22 DIAGNOSIS — J3081 Allergic rhinitis due to animal (cat) (dog) hair and dander: Secondary | ICD-10-CM | POA: Diagnosis not present

## 2016-05-22 DIAGNOSIS — J3089 Other allergic rhinitis: Secondary | ICD-10-CM | POA: Diagnosis not present

## 2016-05-22 DIAGNOSIS — J301 Allergic rhinitis due to pollen: Secondary | ICD-10-CM | POA: Diagnosis not present

## 2016-05-25 DIAGNOSIS — J301 Allergic rhinitis due to pollen: Secondary | ICD-10-CM | POA: Diagnosis not present

## 2016-05-25 DIAGNOSIS — J3089 Other allergic rhinitis: Secondary | ICD-10-CM | POA: Diagnosis not present

## 2016-05-25 DIAGNOSIS — J3081 Allergic rhinitis due to animal (cat) (dog) hair and dander: Secondary | ICD-10-CM | POA: Diagnosis not present

## 2016-05-29 DIAGNOSIS — J301 Allergic rhinitis due to pollen: Secondary | ICD-10-CM | POA: Diagnosis not present

## 2016-05-29 DIAGNOSIS — J3089 Other allergic rhinitis: Secondary | ICD-10-CM | POA: Diagnosis not present

## 2016-05-29 DIAGNOSIS — J3081 Allergic rhinitis due to animal (cat) (dog) hair and dander: Secondary | ICD-10-CM | POA: Diagnosis not present

## 2016-05-31 DIAGNOSIS — J301 Allergic rhinitis due to pollen: Secondary | ICD-10-CM | POA: Diagnosis not present

## 2016-05-31 DIAGNOSIS — J3081 Allergic rhinitis due to animal (cat) (dog) hair and dander: Secondary | ICD-10-CM | POA: Diagnosis not present

## 2016-05-31 DIAGNOSIS — J3089 Other allergic rhinitis: Secondary | ICD-10-CM | POA: Diagnosis not present

## 2016-06-05 DIAGNOSIS — J3089 Other allergic rhinitis: Secondary | ICD-10-CM | POA: Diagnosis not present

## 2016-06-05 DIAGNOSIS — J301 Allergic rhinitis due to pollen: Secondary | ICD-10-CM | POA: Diagnosis not present

## 2016-06-05 DIAGNOSIS — J3081 Allergic rhinitis due to animal (cat) (dog) hair and dander: Secondary | ICD-10-CM | POA: Diagnosis not present

## 2016-06-08 DIAGNOSIS — J301 Allergic rhinitis due to pollen: Secondary | ICD-10-CM | POA: Diagnosis not present

## 2016-06-08 DIAGNOSIS — J3089 Other allergic rhinitis: Secondary | ICD-10-CM | POA: Diagnosis not present

## 2016-06-08 DIAGNOSIS — J3081 Allergic rhinitis due to animal (cat) (dog) hair and dander: Secondary | ICD-10-CM | POA: Diagnosis not present

## 2016-06-11 ENCOUNTER — Other Ambulatory Visit: Payer: Self-pay

## 2016-06-11 MED ORDER — METHYLPHENIDATE 20 MG/9HR TD PTCH: 2.0000 | MEDICATED_PATCH | Freq: Every day | TRANSDERMAL | 0 refills | Status: DC

## 2016-06-11 NOTE — Telephone Encounter (Signed)
V/M left requesting rx for daytrana patch. Call when ready for pick up. Last printed # 60 on 05/07/16. Last seen for f/u 10/18/14. No future appt scheduled.

## 2016-06-11 NOTE — Telephone Encounter (Signed)
Spoke with patient RX faxed to [facility]

## 2016-06-12 DIAGNOSIS — J301 Allergic rhinitis due to pollen: Secondary | ICD-10-CM | POA: Diagnosis not present

## 2016-06-12 DIAGNOSIS — J3089 Other allergic rhinitis: Secondary | ICD-10-CM | POA: Diagnosis not present

## 2016-06-12 DIAGNOSIS — J3081 Allergic rhinitis due to animal (cat) (dog) hair and dander: Secondary | ICD-10-CM | POA: Diagnosis not present

## 2016-06-14 DIAGNOSIS — J3089 Other allergic rhinitis: Secondary | ICD-10-CM | POA: Diagnosis not present

## 2016-06-14 DIAGNOSIS — J3081 Allergic rhinitis due to animal (cat) (dog) hair and dander: Secondary | ICD-10-CM | POA: Diagnosis not present

## 2016-06-14 DIAGNOSIS — J301 Allergic rhinitis due to pollen: Secondary | ICD-10-CM | POA: Diagnosis not present

## 2016-06-19 DIAGNOSIS — J301 Allergic rhinitis due to pollen: Secondary | ICD-10-CM | POA: Diagnosis not present

## 2016-06-19 DIAGNOSIS — J3089 Other allergic rhinitis: Secondary | ICD-10-CM | POA: Diagnosis not present

## 2016-06-19 DIAGNOSIS — J3081 Allergic rhinitis due to animal (cat) (dog) hair and dander: Secondary | ICD-10-CM | POA: Diagnosis not present

## 2016-06-24 DIAGNOSIS — M25531 Pain in right wrist: Secondary | ICD-10-CM | POA: Diagnosis not present

## 2016-06-24 DIAGNOSIS — S46911A Strain of unspecified muscle, fascia and tendon at shoulder and upper arm level, right arm, initial encounter: Secondary | ICD-10-CM | POA: Diagnosis not present

## 2016-06-24 DIAGNOSIS — M25511 Pain in right shoulder: Secondary | ICD-10-CM | POA: Diagnosis not present

## 2016-06-26 DIAGNOSIS — J301 Allergic rhinitis due to pollen: Secondary | ICD-10-CM | POA: Diagnosis not present

## 2016-06-26 DIAGNOSIS — J3081 Allergic rhinitis due to animal (cat) (dog) hair and dander: Secondary | ICD-10-CM | POA: Diagnosis not present

## 2016-06-26 DIAGNOSIS — J3089 Other allergic rhinitis: Secondary | ICD-10-CM | POA: Diagnosis not present

## 2016-06-28 DIAGNOSIS — J3081 Allergic rhinitis due to animal (cat) (dog) hair and dander: Secondary | ICD-10-CM | POA: Diagnosis not present

## 2016-06-28 DIAGNOSIS — J3089 Other allergic rhinitis: Secondary | ICD-10-CM | POA: Diagnosis not present

## 2016-06-28 DIAGNOSIS — J301 Allergic rhinitis due to pollen: Secondary | ICD-10-CM | POA: Diagnosis not present

## 2016-07-03 DIAGNOSIS — J3081 Allergic rhinitis due to animal (cat) (dog) hair and dander: Secondary | ICD-10-CM | POA: Diagnosis not present

## 2016-07-03 DIAGNOSIS — J301 Allergic rhinitis due to pollen: Secondary | ICD-10-CM | POA: Diagnosis not present

## 2016-07-03 DIAGNOSIS — J3089 Other allergic rhinitis: Secondary | ICD-10-CM | POA: Diagnosis not present

## 2016-07-10 DIAGNOSIS — J301 Allergic rhinitis due to pollen: Secondary | ICD-10-CM | POA: Diagnosis not present

## 2016-07-10 DIAGNOSIS — J3081 Allergic rhinitis due to animal (cat) (dog) hair and dander: Secondary | ICD-10-CM | POA: Diagnosis not present

## 2016-07-10 DIAGNOSIS — J3089 Other allergic rhinitis: Secondary | ICD-10-CM | POA: Diagnosis not present

## 2016-07-11 ENCOUNTER — Other Ambulatory Visit: Payer: Self-pay

## 2016-07-11 NOTE — Telephone Encounter (Signed)
Pt mom left v/m requesting rx daytrana. Call pt when ready for pick up. Last printed # 60 on 06/11/16. Last seen ADHD 10/18/14 and last acute visit 05/11/15. No future appt scheduled.

## 2016-07-11 NOTE — Telephone Encounter (Signed)
Ok to print out and put on my desk for signature.  Thank you!

## 2016-07-12 MED ORDER — METHYLPHENIDATE 20 MG/9HR TD PTCH: 2.0000 | MEDICATED_PATCH | Freq: Every day | TRANSDERMAL | 0 refills | Status: DC

## 2016-07-12 NOTE — Telephone Encounter (Signed)
Left message on vm that rx is up front ready for pickup 

## 2016-07-17 DIAGNOSIS — L02411 Cutaneous abscess of right axilla: Secondary | ICD-10-CM | POA: Diagnosis not present

## 2016-07-17 DIAGNOSIS — L732 Hidradenitis suppurativa: Secondary | ICD-10-CM | POA: Diagnosis not present

## 2016-07-19 ENCOUNTER — Inpatient Hospital Stay (HOSPITAL_COMMUNITY)
Admission: EM | Admit: 2016-07-19 | Discharge: 2016-07-24 | DRG: 603 | Disposition: A | Discharge status: Home / Self Care | Payer: 59 | Attending: General Surgery | Admitting: General Surgery

## 2016-07-19 ENCOUNTER — Encounter (HOSPITAL_COMMUNITY): Payer: Self-pay | Admitting: Emergency Medicine

## 2016-07-19 DIAGNOSIS — F909 Attention-deficit hyperactivity disorder, unspecified type: Secondary | ICD-10-CM | POA: Diagnosis present

## 2016-07-19 DIAGNOSIS — L0291 Cutaneous abscess, unspecified: Secondary | ICD-10-CM | POA: Diagnosis not present

## 2016-07-19 DIAGNOSIS — Z91013 Allergy to seafood: Secondary | ICD-10-CM

## 2016-07-19 DIAGNOSIS — L732 Hidradenitis suppurativa: Secondary | ICD-10-CM | POA: Diagnosis not present

## 2016-07-19 DIAGNOSIS — L02411 Cutaneous abscess of right axilla: Secondary | ICD-10-CM | POA: Diagnosis not present

## 2016-07-19 DIAGNOSIS — L02419 Cutaneous abscess of limb, unspecified: Secondary | ICD-10-CM

## 2016-07-19 DIAGNOSIS — L03111 Cellulitis of right axilla: Secondary | ICD-10-CM | POA: Diagnosis present

## 2016-07-19 DIAGNOSIS — L0390 Cellulitis, unspecified: Secondary | ICD-10-CM | POA: Diagnosis not present

## 2016-07-19 LAB — BASIC METABOLIC PANEL
Anion gap: 12 (ref 5–15)
BUN: 7 mg/dL (ref 6–20)
CHLORIDE: 101 mmol/L (ref 101–111)
CO2: 22 mmol/L (ref 22–32)
Calcium: 9.2 mg/dL (ref 8.9–10.3)
Creatinine, Ser: 0.97 mg/dL (ref 0.44–1.00)
GFR calc Af Amer: >60 mL/min (ref >60)
GFR calc non Af Amer: >60 mL/min (ref >60)
GLUCOSE: 83 mg/dL (ref 65–99)
POTASSIUM: 3.9 mmol/L (ref 3.5–5.1)
Sodium: 135 mmol/L (ref 135–145)

## 2016-07-19 LAB — CBC
HCT: 39 % (ref 36.0–46.0)
HEMOGLOBIN: 12.9 g/dL (ref 12.0–15.0)
MCH: 29.5 pg (ref 26.0–34.0)
MCHC: 33.1 g/dL (ref 30.0–36.0)
MCV: 89.2 fL (ref 78.0–100.0)
Platelets: 334 10*3/uL (ref 150–400)
RBC: 4.37 MIL/uL (ref 3.87–5.11)
RDW: 13.1 % (ref 11.5–15.5)
WBC: 19 10*3/uL — ABNORMAL HIGH (ref 4.0–10.5)

## 2016-07-19 MED ORDER — AMMONIA AROMATIC IN INHA: 0.3000 mL | Freq: Once | RESPIRATORY_TRACT | Status: DC

## 2016-07-19 NOTE — H&P (Signed)
Courtney [name] is an 26 y.o. female.   Chief Complaint: Pain right axilla HPI: Courtney [name] developed pain and swelling in her right axilla 6 days ago. She has a history of hidradenitis suppurativa and has undergone incision and drainage of abscesses of her axillae in the past. This area got much larger. She was seen by a dermatologist today and they were able to drain it slightly but, due to its size, she was referred to the emergency department. I was asked to see her for further surgical management. She complains of localized pain in the area and it impairs her ability to move her arm very much. It has been draining spontaneously a small amount.  Past Medical History:  Diagnosis Date  . ADHD (attention deficit hyperactivity disorder)   . Chronic tonsillitis 08/2013   current strep, will finish antibiotic 09/04/2013; snores during sleep, mother denies apnea    Past Surgical History:  Procedure Laterality Date  . TONSILLECTOMY Bilateral 09/14/2013   Procedure: BILATERAL TONSILLECTOMY;  Surgeon: Izora Gala, MD;  Location: [facility];  Service: ENT;  Laterality: Bilateral;  . TYMPANOPLASTY Bilateral   . WISDOM TOOTH EXTRACTION      Family History  Problem Relation Age of Onset  . Hypertension Mother   . Hyperlipidemia Mother    Social History:  reports that she has never smoked. She has never used smokeless tobacco. She reports that she drinks alcohol. She reports that she does not use drugs.  Allergies:  Allergies  Allergen Reactions  . Shellfish Allergy Anaphylaxis     (Not in a hospital admission)  Results for orders placed or performed during the hospital encounter of 07/19/16 (from the past 48 hour(s))  CBC     Status: Abnormal   Collection Time: 07/19/16  8:55 PM  Result Value Ref Range   WBC 19.0 (H) 4.0 - 10.5 K/uL   RBC 4.37 3.87 - 5.11 MIL/uL   Hemoglobin 12.9 12.0 - 15.0 g/dL   HCT 39.0 36.0 - 46.0 %   MCV 89.2 78.0 - 100.0 fL   MCH 29.5 26.0 - 34.0 pg   MCHC 33.1 30.0 - 36.0 g/dL   RDW 13.1 11.5 - 15.5 %   Platelets 334 150 - 400 K/uL  Basic metabolic panel     Status: None   Collection Time: 07/19/16  8:55 PM  Result Value Ref Range   Sodium 135 135 - 145 mmol/L   Potassium 3.9 3.5 - 5.1 mmol/L   Chloride 101 101 - 111 mmol/L   CO2 22 22 - 32 mmol/L   Glucose, Bld 83 65 - 99 mg/dL   BUN 7 6 - 20 mg/dL   Creatinine, Ser 0.97 0.44 - 1.00 mg/dL   Calcium 9.2 8.9 - 10.3 mg/dL   GFR calc non Af Amer >60 >60 mL/min   GFR calc Af Amer >60 >60 mL/min    Comment: (NOTE) The eGFR has been calculated using the CKD EPI equation. This calculation has not been validated in all clinical situations. eGFR's persistently <60 mL/min signify possible Chronic Kidney Disease.    Anion gap 12 5 - 15   No results found.  Review of Systems  Constitutional: Negative for chills and fever.  HENT: Negative for hearing loss and sore throat.   Eyes: Negative for blurred vision and double vision.  Respiratory: Negative for cough and shortness of breath.   Cardiovascular: Negative for chest pain.  Gastrointestinal: Negative for abdominal pain, nausea and vomiting.  Genitourinary: Negative.  Musculoskeletal:       See HPI  Skin:       See HPI  Neurological: Negative.   Endo/Heme/Allergies: Negative.   Psychiatric/Behavioral:       ADHD    Blood pressure 115/77, pulse 95, temperature 98.5 F (36.9 C), temperature source Oral, resp. rate 18, SpO2 99 %. Physical Exam  Constitutional: She is oriented to person, place, and time. She appears well-developed and well-nourished. No distress.  HENT:  Head: Normocephalic.  Right Ear: External ear normal.  Left Ear: External ear normal.  Nose: Nose normal.  Mouth/Throat: Oropharynx is clear and moist.  Eyes: EOM are normal. Pupils are equal, round, and reactive to light.  Neck: Neck supple.  Cardiovascular: Normal rate, normal heart sounds and intact distal pulses.   Respiratory: Effort normal and  breath sounds normal. No respiratory distress. She has no wheezes. She has no rales.  GI: Soft. She exhibits no distension. There is no tenderness. There is no rebound.  Musculoskeletal:       Arms: 6 cm fluctuant area right axilla with 2 small draining sinuses and surrounding erythema and cellulitis. Small nodule left axilla with no significant cellulitis or abscess.  Neurological: She is alert and oriented to person, place, and time. She exhibits normal muscle tone.  Psychiatric: She has a normal mood and affect.     Assessment/Plan Right axillary abscess, likely due to hidradenitis suppurativa - will make npo, admit and start broad-spectrum IV antibiotics. Plan incision and drainage of this abscess this admission, likely later today, depending on OR availability. I will discuss her case with Dr. Ninfa Linden.  Zenovia Jarred, MD 07/19/2016, 11:56 PM

## 2016-07-19 NOTE — ED Provider Notes (Signed)
HPI Comments: Courtney ApleyBrandy L [name] is a 26 y.o. female with no pertinent PMHx, who presents to the Emergency Department complaining of gradally worsening rigth inner arm swelling and pain that started six days ago. Pt notes having a fever of 100.3 with she treated with Ibuprofen. Six days ago pt went to UC and was prescribed Generic Bactrim and Percocet, with no relief. She notes intermittent drainage of the area with mild relief. Today pt states she went to [facility] where they were able to drain fluids from the area. Per pt's mother, dermatologist noted they may need to surgically remove her sweat glands. Denies nausea or vomiting.    PE- Large right axilla abscess,. 6cm. Erthyema extending into right upper arm. Unable to abduct due to pain .   Patient with cellulitis and axillary abscess. Given size and difficulty moving her arm, will have surgery evaluate.  I personally performed the services described in this documentation, which was scribed in my presence. The recorded information has been reviewed and is accurate.    Glynn Octaveancour, German Manke, MD 07/20/16 [phone]

## 2016-07-19 NOTE — ED Triage Notes (Signed)
Pt presents to ED for assessment of right axilla cellulitis and abscess.  Pt seen at [facility] where they attempted to drain and cultured it.  The borders were marked and redness has not spread outside.  Pt is currently taking Oxycodone and Sulfamethoxazole.

## 2016-07-19 NOTE — ED Provider Notes (Signed)
[facility]-EMERGENCY DEPT Provider Note   CSN: [id number] Arrival date & time: 07/19/16  2031     History   Chief Complaint Chief Complaint  Patient presents with  . Cellulitis    HPI Courtney [name] is a 26 y.o. female.  Patient presents to the ED with a chief complaint of right axillary abscess.  She states that it started on Friday of last week.  She reports increasingly worsening symptoms.  She states that she was seen at an urgent care on Tuesday and has been taking Bactrim since then.  No I&D was performed.  She went to her dermatologist today and states that they attempted to drain it, but were unable because she could abduct her arm.  She reports fever to 100.  She denies ever having had an abscess this bad before, but has had abscesses in the past.  The symptoms are worsened with movement and palpation.   The history is provided by the patient. No language interpreter was used.    Past Medical History:  Diagnosis Date  . ADHD (attention deficit hyperactivity disorder)   . Chronic tonsillitis 08/2013   current strep, will finish antibiotic 09/04/2013; snores during sleep, mother denies apnea    Patient Active Problem List   Diagnosis Date Noted  . Vitamin D deficiency 10/18/2014  . Fatigue 07/21/2014  . S/P tonsillectomy 09/14/2013  . Internal nasal lesion 08/21/2012  . ADHD (attention deficit hyperactivity disorder) 02/11/2012    Past Surgical History:  Procedure Laterality Date  . TONSILLECTOMY Bilateral 09/14/2013   Procedure: BILATERAL TONSILLECTOMY;  Surgeon: Serena Colonel, MD;  Location: [facility];  Service: ENT;  Laterality: Bilateral;  . TYMPANOPLASTY Bilateral   . WISDOM TOOTH EXTRACTION      OB History    No data available       Home Medications    Prior to Admission medications   Medication Sig Start Date End Date Taking? Authorizing Provider  Adapalene-Benzoyl Peroxide (EPIDUO FORTE) 0.3-2.5 % GEL Apply topically.    [provider]  Cholecalciferol (VITAMIN D) 2000 units CAPS Take 2 capsules by mouth daily.    [provider]  Dapsone (ACZONE) 7.5 % GEL Apply topically.    [provider]  diphenhydrAMINE (BENADRYL) 25 mg capsule Take 25 mg by mouth every 6 (six) hours as needed for itching or allergies.    [provider]  doxycycline (VIBRA-TABS) 100 MG tablet Take 100 mg by mouth 2 (two) times daily. For acne    [provider]  etonogestrel (NEXPLANON) 68 MG IMPL implant 1 each by Subdermal route once.    [provider]  methylphenidate Lezlie Octave) 20 MG/9HR Place 2 patches onto the skin daily. wear patch for 9 hours only each day 07/12/16   Dianne Dun, MD  minocycline (MINOCIN,DYNACIN) 100 MG capsule Take 100 mg by mouth 2 (two) times daily. Ongoing ABT.    [provider]  montelukast (SINGULAIR) 10 MG tablet TAKE 1 TABLET AT BEDTIME 02/06/16   Dianne Dun, MD    Family History Family History  Problem Relation Age of Onset  . Hypertension Mother   . Hyperlipidemia Mother     Social History Social History  Substance Use Topics  . Smoking status: Never Smoker  . Smokeless tobacco: Never Used  . Alcohol use Yes     Comment: occasionally     Allergies   Patient has no known allergies.   Review of Systems Review of Systems  All other systems reviewed and are negative.    Physical Exam Updated Vital Signs BP 106/66 (BP Location: Left Arm)   Pulse (!) 114   Temp 98.5 F (36.9 C) (Oral)   Resp 18   SpO2 99%   Physical Exam  Constitutional: She is oriented to person, place, and time. She appears well-developed and well-nourished.  HENT:  Head: Normocephalic and atraumatic.  Eyes: Conjunctivae and EOM are normal. Pupils are equal, round, and reactive to light.  Neck: Normal range of motion. Neck supple.  Cardiovascular: Normal rate and regular rhythm.  Exam reveals no gallop and no friction rub.   No murmur  heard. Pulmonary/Chest: Effort normal and breath sounds normal. No respiratory distress. She has no wheezes. She has no rales. She exhibits no tenderness.  Abdominal: Soft. Bowel sounds are normal. She exhibits no distension and no mass. There is no tenderness. There is no rebound and no guarding.  Musculoskeletal: Normal range of motion. She exhibits no edema or tenderness.  Neurological: She is alert and oriented to person, place, and time.  Skin: Skin is warm and dry.  Large golf ball sized abscess to the right axilla with extensive surrounding cellulitis  Psychiatric: She has a normal mood and affect. Her behavior is normal. Judgment and thought content normal.  Nursing note and vitals reviewed.    ED Treatments / Results  Labs (all labs ordered are listed, but only abnormal results are displayed) Labs Reviewed  CBC - Abnormal; Notable for the following:       Result Value   WBC 19.0 (*)    All other components within normal limits  BASIC METABOLIC PANEL    EKG  EKG Interpretation None       Radiology No results found.  Procedures Procedures (including critical care time)  Medications Ordered in ED Medications - No data to display   Initial Impression / Assessment and Plan / ED Course  I have reviewed the triage vital signs and the nursing notes.  Pertinent labs & imaging results that were available during my care of the patient were reviewed by me and considered in my medical decision making (see chart for details).     Patient with large right axillary abscess.  She is unable to abduct the arm due to pain.  Dermatology was unable to drain the abscess in the office and sent her to the ED.  She may be best served by having this drained in the OR.    Patient seen by and discussed with Dr. Manus Gunningancour, who agrees with plan to consult general surgery given the size and severity of the abscess.  Appreciate Dr. Janee Mornhompson for taking patient to OR.  Final Clinical  Impressions(s) / ED Diagnoses   Final diagnoses:  Axillary abscess    New Prescriptions New Prescriptions   No medications on file     Roxy HorsemanBrowning, Lesly Joslyn, Cordelia Poche-C 07/20/16 0001    Rancour, Jeannett SeniorStephen, MD 07/20/16 [phone]

## 2016-07-20 ENCOUNTER — Encounter (HOSPITAL_COMMUNITY): Payer: Self-pay | Admitting: Anesthesiology

## 2016-07-20 ENCOUNTER — Inpatient Hospital Stay (HOSPITAL_COMMUNITY): Payer: 59 | Admitting: Anesthesiology

## 2016-07-20 ENCOUNTER — Encounter (HOSPITAL_COMMUNITY): Admission: EM | Disposition: A | Discharge status: Home / Self Care | Payer: Self-pay | Source: Home / Self Care

## 2016-07-20 HISTORY — PX: INCISION AND DRAINAGE ABSCESS: SHX5864

## 2016-07-20 LAB — CBC
HCT: 35.8 % — ABNORMAL LOW (ref 36.0–46.0)
Hemoglobin: 11.6 g/dL — ABNORMAL LOW (ref 12.0–15.0)
MCH: 29 pg (ref 26.0–34.0)
MCHC: 32.4 g/dL (ref 30.0–36.0)
MCV: 89.5 fL (ref 78.0–100.0)
PLATELETS: 327 10*3/uL (ref 150–400)
RBC: 4 MIL/uL (ref 3.87–5.11)
RDW: 13.3 % (ref 11.5–15.5)
WBC: 17.7 10*3/uL — AB (ref 4.0–10.5)

## 2016-07-20 LAB — I-STAT BETA HCG BLOOD, ED (NOT ORDERABLE)

## 2016-07-20 LAB — SURGICAL PCR SCREEN
MRSA, PCR: NEGATIVE
Staphylococcus aureus: POSITIVE — AB

## 2016-07-20 LAB — HIV ANTIBODY (ROUTINE TESTING W REFLEX): HIV SCREEN 4TH GENERATION: NONREACTIVE

## 2016-07-20 SURGERY — INCISION AND DRAINAGE, ABSCESS
Anesthesia: General | Laterality: Right

## 2016-07-20 MED ORDER — DIPHENHYDRAMINE HCL 25 MG PO CAPS: 25.0000 mg | ORAL_CAPSULE | Freq: Four times a day (QID) | ORAL | Status: DC | PRN

## 2016-07-20 MED ORDER — VANCOMYCIN HCL IN DEXTROSE 1-5 GM/200ML-% IV SOLN
1000.0000 mg | INTRAVENOUS | Status: AC
  Administered 2016-07-20: 1000 mg via INTRAVENOUS
  Filled 2016-07-20: qty 200

## 2016-07-20 MED ORDER — HEMOSTATIC AGENTS (NO CHARGE) OPTIME
TOPICAL | Status: DC | PRN
  Administered 2016-07-20: 1 via TOPICAL

## 2016-07-20 MED ORDER — PROPOFOL 10 MG/ML IV BOLUS
INTRAVENOUS | Status: AC
  Filled 2016-07-20: qty 20

## 2016-07-20 MED ORDER — ALBUMIN HUMAN 5 % IV SOLN
INTRAVENOUS | Status: DC | PRN
  Administered 2016-07-20: 14:00:00 via INTRAVENOUS

## 2016-07-20 MED ORDER — PIPERACILLIN-TAZOBACTAM 3.375 G IVPB 30 MIN
3.3750 g | Freq: Once | INTRAVENOUS | Status: AC
  Administered 2016-07-20: 3.375 g via INTRAVENOUS
  Filled 2016-07-20: qty 50

## 2016-07-20 MED ORDER — BUPIVACAINE HCL (PF) 0.25 % IJ SOLN
INTRAMUSCULAR | Status: DC | PRN
  Administered 2016-07-20: 20 mL

## 2016-07-20 MED ORDER — LACTATED RINGERS IV SOLN
INTRAVENOUS | Status: DC
  Administered 2016-07-20 (×2): via INTRAVENOUS

## 2016-07-20 MED ORDER — MORPHINE SULFATE (PF) 4 MG/ML IV SOLN
2.0000 mg | INTRAVENOUS | Status: DC | PRN
  Administered 2016-07-20: 2 mg via INTRAVENOUS
  Filled 2016-07-20: qty 1

## 2016-07-20 MED ORDER — MIDAZOLAM HCL 5 MG/5ML IJ SOLN
INTRAMUSCULAR | Status: DC | PRN
  Administered 2016-07-20: 2 mg via INTRAVENOUS

## 2016-07-20 MED ORDER — FENTANYL CITRATE (PF) 100 MCG/2ML IJ SOLN
INTRAMUSCULAR | Status: DC | PRN
Start: 2016-07-20 — End: 2016-07-20
  Administered 2016-07-20 (×2): 25 ug via INTRAVENOUS
  Administered 2016-07-20: 100 ug via INTRAVENOUS
  Administered 2016-07-20: 25 ug via INTRAVENOUS

## 2016-07-20 MED ORDER — PROPOFOL 10 MG/ML IV BOLUS
INTRAVENOUS | Status: DC | PRN
Start: 2016-07-20 — End: 2016-07-20
  Administered 2016-07-20: 150 mg via INTRAVENOUS

## 2016-07-20 MED ORDER — OXYCODONE-ACETAMINOPHEN 5-325 MG PO TABS
1.0000 | ORAL_TABLET | ORAL | Status: DC | PRN
  Administered 2016-07-20 – 2016-07-24 (×12): 2 via ORAL
  Filled 2016-07-20 (×12): qty 2

## 2016-07-20 MED ORDER — ZOLPIDEM TARTRATE 5 MG PO TABS: 5.0000 mg | ORAL_TABLET | Freq: Every evening | ORAL | Status: DC | PRN

## 2016-07-20 MED ORDER — MONTELUKAST SODIUM 10 MG PO TABS
10.0000 mg | ORAL_TABLET | Freq: Every day | ORAL | Status: DC
  Administered 2016-07-20: 10 mg via ORAL
  Filled 2016-07-20 (×2): qty 1

## 2016-07-20 MED ORDER — KCL IN DEXTROSE-NACL 20-5-0.45 MEQ/L-%-% IV SOLN
INTRAVENOUS | Status: DC
  Administered 2016-07-20 – 2016-07-21 (×3): via INTRAVENOUS
  Filled 2016-07-20 (×3): qty 1000

## 2016-07-20 MED ORDER — PIPERACILLIN-TAZOBACTAM 3.375 G IVPB
3.3750 g | Freq: Three times a day (TID) | INTRAVENOUS | Status: DC
  Administered 2016-07-20 – 2016-07-24 (×13): 3.375 g via INTRAVENOUS
  Filled 2016-07-20 (×14): qty 50

## 2016-07-20 MED ORDER — MIDAZOLAM HCL 2 MG/2ML IJ SOLN
INTRAMUSCULAR | Status: AC
  Filled 2016-07-20: qty 2

## 2016-07-20 MED ORDER — OXYCODONE-ACETAMINOPHEN 5-325 MG PO TABS
1.0000 | ORAL_TABLET | ORAL | Status: DC | PRN
  Administered 2016-07-20 (×2): 1 via ORAL
  Filled 2016-07-20 (×2): qty 1

## 2016-07-20 MED ORDER — 0.9 % SODIUM CHLORIDE (POUR BTL) OPTIME
TOPICAL | Status: DC | PRN
  Administered 2016-07-20: 1000 mL

## 2016-07-20 MED ORDER — DIPHENHYDRAMINE HCL 50 MG/ML IJ SOLN: 25.0000 mg | Freq: Four times a day (QID) | INTRAMUSCULAR | Status: DC | PRN

## 2016-07-20 MED ORDER — VANCOMYCIN HCL IN DEXTROSE 1-5 GM/200ML-% IV SOLN
1000.0000 mg | Freq: Two times a day (BID) | INTRAVENOUS | Status: DC
  Administered 2016-07-20 – 2016-07-24 (×8): 1000 mg via INTRAVENOUS
  Filled 2016-07-20 (×9): qty 200

## 2016-07-20 MED ORDER — BUPIVACAINE HCL (PF) 0.25 % IJ SOLN
INTRAMUSCULAR | Status: AC
  Filled 2016-07-20: qty 30

## 2016-07-20 MED ORDER — MUPIROCIN 2 % EX OINT
1.0000 "application " | TOPICAL_OINTMENT | Freq: Two times a day (BID) | CUTANEOUS | Status: DC
  Administered 2016-07-21 – 2016-07-24 (×7): 1 via NASAL
  Filled 2016-07-20: qty 22

## 2016-07-20 MED ORDER — CHLORHEXIDINE GLUCONATE CLOTH 2 % EX PADS
6.0000 | MEDICATED_PAD | Freq: Every day | CUTANEOUS | Status: AC
  Administered 2016-07-20 – 2016-07-24 (×5): 6 via TOPICAL

## 2016-07-20 MED ORDER — ONDANSETRON HCL 4 MG/2ML IJ SOLN
INTRAMUSCULAR | Status: DC | PRN
  Administered 2016-07-20: 4 mg via INTRAVENOUS

## 2016-07-20 MED ORDER — DEXAMETHASONE SODIUM PHOSPHATE 10 MG/ML IJ SOLN
INTRAMUSCULAR | Status: DC | PRN
  Administered 2016-07-20: 10 mg via INTRAVENOUS

## 2016-07-20 MED ORDER — MORPHINE SULFATE (PF) 4 MG/ML IV SOLN
2.0000 mg | INTRAVENOUS | Status: DC | PRN
  Administered 2016-07-22: 2 mg via INTRAVENOUS
  Administered 2016-07-23: 4 mg via INTRAVENOUS
  Administered 2016-07-23: 2 mg via INTRAVENOUS
  Filled 2016-07-20 (×3): qty 1

## 2016-07-20 MED ORDER — PANTOPRAZOLE SODIUM 40 MG IV SOLR
40.0000 mg | Freq: Every day | INTRAVENOUS | Status: DC
  Administered 2016-07-20 (×2): 40 mg via INTRAVENOUS
  Filled 2016-07-20 (×2): qty 40

## 2016-07-20 MED ORDER — ONDANSETRON HCL 4 MG/2ML IJ SOLN
4.0000 mg | Freq: Four times a day (QID) | INTRAMUSCULAR | Status: DC | PRN
  Administered 2016-07-22: 4 mg via INTRAVENOUS
  Filled 2016-07-20: qty 2

## 2016-07-20 MED ORDER — HYDROMORPHONE HCL 1 MG/ML IJ SOLN: 0.2500 mg | INTRAMUSCULAR | Status: DC | PRN

## 2016-07-20 MED ORDER — ONDANSETRON 4 MG PO TBDP: 4.0000 mg | ORAL_TABLET | Freq: Four times a day (QID) | ORAL | Status: DC | PRN

## 2016-07-20 MED ORDER — METHYLPHENIDATE 20 MG/9HR TD PTCH: 2.0000 | MEDICATED_PATCH | Freq: Every day | TRANSDERMAL | Status: DC

## 2016-07-20 MED ORDER — LIDOCAINE HCL (CARDIAC) 20 MG/ML IV SOLN
INTRAVENOUS | Status: DC | PRN
  Administered 2016-07-20: 60 mg via INTRAVENOUS

## 2016-07-20 MED ORDER — FENTANYL CITRATE (PF) 250 MCG/5ML IJ SOLN
INTRAMUSCULAR | Status: AC
  Filled 2016-07-20: qty 5

## 2016-07-20 SURGICAL SUPPLY — 31 items
BNDG GAUZE ELAST 4 BULKY (GAUZE/BANDAGES/DRESSINGS) ×3 IMPLANT
CANISTER SUCT 3000ML PPV (MISCELLANEOUS) ×3 IMPLANT
COVER SURGICAL LIGHT HANDLE (MISCELLANEOUS) ×3 IMPLANT
DRAPE LAPAROSCOPIC ABDOMINAL (DRAPES) ×3 IMPLANT
DRAPE UTILITY XL STRL (DRAPES) ×3 IMPLANT
DRSG PAD ABDOMINAL 8X10 ST (GAUZE/BANDAGES/DRESSINGS) ×3 IMPLANT
ELECT CAUTERY BLADE 6.4 (BLADE) ×3 IMPLANT
ELECT REM PT RETURN 9FT ADLT (ELECTROSURGICAL) ×3
ELECTRODE REM PT RTRN 9FT ADLT (ELECTROSURGICAL) ×1 IMPLANT
GAUZE SPONGE 4X4 12PLY STRL (GAUZE/BANDAGES/DRESSINGS) ×3 IMPLANT
GLOVE BIOGEL PI IND STRL 7.0 (GLOVE) ×1 IMPLANT
GLOVE BIOGEL PI INDICATOR 7.0 (GLOVE) ×2
GLOVE SURG SIGNA 7.5 PF LTX (GLOVE) ×3 IMPLANT
GLOVE SURG SS PI 7.0 STRL IVOR (GLOVE) ×3 IMPLANT
GOWN STRL REUS W/ TWL LRG LVL3 (GOWN DISPOSABLE) ×1 IMPLANT
GOWN STRL REUS W/ TWL XL LVL3 (GOWN DISPOSABLE) ×1 IMPLANT
GOWN STRL REUS W/TWL LRG LVL3 (GOWN DISPOSABLE) ×2
GOWN STRL REUS W/TWL XL LVL3 (GOWN DISPOSABLE) ×2
KIT BASIN OR (CUSTOM PROCEDURE TRAY) ×3 IMPLANT
KIT ROOM TURNOVER OR (KITS) ×3 IMPLANT
NEEDLE HYPO 25GX1X1/2 BEV (NEEDLE) ×3 IMPLANT
NS IRRIG 1000ML POUR BTL (IV SOLUTION) ×3 IMPLANT
PACK GENERAL/GYN (CUSTOM PROCEDURE TRAY) ×3 IMPLANT
PAD ARMBOARD 7.5X6 YLW CONV (MISCELLANEOUS) ×6 IMPLANT
SPONGE LAP 18X18 X RAY DECT (DISPOSABLE) ×3 IMPLANT
SUT VIC AB 2-0 SH 27 (SUTURE) ×2
SUT VIC AB 2-0 SH 27XBRD (SUTURE) ×1 IMPLANT
SWAB CULTURE ESWAB REG 1ML (MISCELLANEOUS) ×3 IMPLANT
SYR CONTROL 10ML LL (SYRINGE) ×3 IMPLANT
TAPE CLOTH SURG 4X10 WHT LF (GAUZE/BANDAGES/DRESSINGS) ×3 IMPLANT
TOWEL OR 17X24 6PK STRL BLUE (TOWEL DISPOSABLE) ×3 IMPLANT

## 2016-07-20 NOTE — Op Note (Signed)
INCISION AND DRAINAGE ABSCESS right axilla  Procedure Note  Courtney ApleyBrandy L [name] 07/19/2016 - 07/20/2016   Pre-op Diagnosis: right axillary abscess     Post-op Diagnosis: same  Procedure(s): INCISION AND DRAINAGE ABSCESS right axilla  Surgeon(s): Abigail MiyamotoBlackman, Virda Betters, MD  Anesthesia: General  Staff:  Circulator: Keenan BachelorByrley, Darija, RN Scrub Person: Panchit, Donella StadeNoukone M Circulator Assistant: Woodroe ModeHickling, Kelly A, RN  Estimated Blood Loss: less than 100 mL               Cultures sent  Procedure: The patient was brought to the operating room and identified as the correct patient. She was placed supine on the operative table and general anesthesia was induced. Her right axilla and arm were then prepped and draped in the usual sterile fashion. She had [name] large abscess on the distal aspect of her right axilla toward the arm which was actively draining purulence. I have prepped the skin with [name] scalpel and entered [name] very large abscess cavity containing [name] large amount of pus. I suctioned the purulent fluid out after obtaining cultures. The entire wound was completely raw and there was venous blood is oozing from all of the raw surfaces. I used the cautery and held pressure to achieve hemostasis. I then placed [name] piece of Surgicel in the wound and packed it tightly with dry gauze. More gauze and ABDs and tape were then placed over the incision. She tolerated procedure well. All the counts were correct at the end of the procedure. The patient was then extubated in the operating room and taken to the recovery room.          Courtney [name]   Date: 07/20/2016  Time: 2:26 PM

## 2016-07-20 NOTE — Progress Notes (Signed)
Pharmacy Antibiotic Note  Courtney ApleyBrandy L [name] is a 26 y.o. female admitted on 07/19/2016 with R axilla cellulitis.  Pharmacy has been consulted for Vancomycin dosing. Pt on Zosyn also. Plan for I&D 5/18.  Plan: Vancomycin 1gm IV q12h Will f/u micro data, renal function, and pt's clinical condition Vanc trough prn   Height: 5' 0.75" (154.3 cm) Weight: 175 lb 11.2 oz (79.7 kg) IBW/kg (Calculated) : 47.23  Temp (24hrs), Avg:98.6 F (37 C), Min:98.5 F (36.9 C), Max:98.7 F (37.1 C)   Recent Labs Lab 07/19/16 2055  WBC 19.0*  CREATININE 0.97    Estimated Creatinine Clearance: 85.7 mL/min (by C-G formula based on SCr of 0.97 mg/dL).    Allergies  Allergen Reactions  . Shellfish Allergy Anaphylaxis    Antimicrobials this admission: 5/18 Zosyn >>  5/18 Vanc >>   Dose adjustments this admission: n/a  Thank you for allowing pharmacy to be a part of this patient's care.  Christoper Fabianaron Zenon Leaf, PharmD, BCPS Clinical pharmacist, pager [phone] 07/20/2016 1:00 AM

## 2016-07-20 NOTE — Transfer of Care (Signed)
Immediate Anesthesia Transfer of Care Note  Patient: Courtney [name]  Procedure(s) Performed: Procedure(s): INCISION AND DRAINAGE ABSCESS right axilla (Right)  Patient Location: PACU  Anesthesia Type:General  Level of Consciousness: awake, alert  and patient cooperative  Airway & Oxygen Therapy: Patient Spontanous Breathing and Patient connected to face mask oxygen  Post-op Assessment: Report given to RN, Post -op Vital signs reviewed and stable, Patient moving all extremities X 4 and Patient able to stick tongue midline  Post vital signs: Reviewed and stable  Last Vitals:  Vitals:   07/20/16 1311 07/20/16 1445  BP: 99/69 (P) 105/65  Pulse: (!) 102   Resp: 18 (P) 10  Temp:  (P) 36.2 C    Last Pain:  Vitals:   07/20/16 1445  TempSrc:   PainSc: (P) Asleep      Patients Stated Pain Goal: 2 (07/20/16 0442)  Complications: No apparent anesthesia complications

## 2016-07-20 NOTE — Anesthesia Preprocedure Evaluation (Addendum)
Anesthesia Evaluation  Patient identified by MRN, date of birth, ID band Patient awake    Reviewed: Allergy & Precautions, H&P , NPO status , Patient's Chart, lab work & pertinent test results  Airway Mallampati: II  TM Distance: >3 FB Neck ROM: Full    Dental no notable dental hx. (+) Teeth Intact, Dental Advisory Given   Pulmonary neg pulmonary ROS,    Pulmonary exam normal breath sounds clear to auscultation       Cardiovascular negative cardio ROS   Rhythm:Regular Rate:Normal     Neuro/Psych negative neurological ROS  negative psych ROS   GI/Hepatic negative GI ROS, Neg liver ROS,   Endo/Other  negative endocrine ROS  Renal/GU negative Renal ROS  negative genitourinary   Musculoskeletal   Abdominal   Peds  (+) ADHD Hematology negative hematology ROS (+)   Anesthesia Other Findings   Reproductive/Obstetrics negative OB ROS                            Anesthesia Physical Anesthesia Plan  ASA: II  Anesthesia Plan: General   Post-op Pain Management:    Induction: Intravenous  Airway Management Planned: LMA  Additional Equipment:   Intra-op Plan:   Post-operative Plan: Extubation in OR  Informed Consent: I have reviewed the patients History and Physical, chart, labs and discussed the procedure including the risks, benefits and alternatives for the proposed anesthesia with the patient or authorized representative who has indicated his/her understanding and acceptance.   Dental advisory given  Plan Discussed with: CRNA  Anesthesia Plan Comments:         Anesthesia Quick Evaluation

## 2016-07-20 NOTE — ED Notes (Signed)
RN found pt w/ tears in her eyes, she expressed anxiety about the upcoming treatment.  RN reassured her and provided support.  She expressed that she felt better.

## 2016-07-20 NOTE — Anesthesia Postprocedure Evaluation (Addendum)
Anesthesia Post Note  Patient: Courtney [name]  Procedure(s) Performed: Procedure(s) (LRB): INCISION AND DRAINAGE ABSCESS right axilla (Right)  Patient location during evaluation: PACU Anesthesia Type: General Level of consciousness: awake and alert Pain management: pain level controlled Vital Signs Assessment: post-procedure vital signs reviewed and stable Respiratory status: spontaneous breathing, nonlabored ventilation, respiratory function stable and patient connected to nasal cannula oxygen Cardiovascular status: blood pressure returned to baseline and stable Postop Assessment: no signs of nausea or vomiting Anesthetic complications: no       Last Vitals:  Vitals:   07/20/16 1514 07/20/16 1529  BP: 98/68 96/60  Pulse: 96 95  Resp: 14   Temp:  36.9 C    Last Pain:  Vitals:   07/20/16 1757  TempSrc:   PainSc: 9                  Hattie Aguinaldo

## 2016-07-20 NOTE — Progress Notes (Signed)
   Subjective/Chief Complaint: Complains of right axillary pain   Objective: Vital signs in last 24 hours: Temp:  [98.5 F (36.9 C)-98.7 F (37.1 C)] 98.6 F (37 C) (05/18 0445) Pulse Rate:  [86-114] 93 (05/18 0445) Resp:  [17-18] 17 (05/18 0445) BP: (95-115)/(41-77) 113/41 (05/18 0445) SpO2:  [96 %-100 %] 98 % (05/18 0445) Weight:  [79.7 kg (175 lb 11.2 oz)] 79.7 kg (175 lb 11.2 oz) (05/18 0058) Last BM Date: 07/19/16  Intake/Output from previous day: 05/17 0701 - 05/18 0700 In: 531.7 [I.V.:331.7; IV Piggyback:200] Out: -  Intake/Output this shift: No intake/output data recorded.  Exam: Abscess and cellulitis right axilla with draining purulence Lab Results:   Recent Labs  07/19/16 2055 07/20/16 0531  WBC 19.0* 17.7*  HGB 12.9 11.6*  HCT 39.0 35.8*  PLT 334 327   BMET  Recent Labs  07/19/16 2055  NA 135  K 3.9  CL 101  CO2 22  GLUCOSE 83  BUN 7  CREATININE 0.97  CALCIUM 9.2   PT/INR No results for input(s): LABPROT, INR in the last 72 hours. ABG No results for input(s): PHART, HCO3 in the last 72 hours.  Invalid input(s): PCO2, PO2  Studies/Results: No results found.  Anti-infectives: Anti-infectives    Start     Dose/Rate Route Frequency Ordered Stop   07/20/16 1200  vancomycin (VANCOCIN) IVPB 1000 mg/200 mL premix     1,000 mg 200 mL/hr over 60 Minutes Intravenous Every 12 hours 07/20/16 0104     07/20/16 0800  piperacillin-tazobactam (ZOSYN) IVPB 3.375 g     3.375 g 12.5 mL/hr over 240 Minutes Intravenous Every 8 hours 07/20/16 0049     07/20/16 0115  vancomycin (VANCOCIN) IVPB 1000 mg/200 mL premix     1,000 mg 200 mL/hr over 60 Minutes Intravenous STAT 07/20/16 0104 07/20/16 0229   07/20/16 0100  piperacillin-tazobactam (ZOSYN) IVPB 3.375 g     3.375 g 100 mL/hr over 30 Minutes Intravenous  Once 07/20/16 0057 07/20/16 0159      Assessment/Plan: s/p Procedure(s): INCISION AND DRAINAGE ABSCESS right axilla (Right)  Plan I and D  in the OR today. I discussed the risks of the procedure with the patient and she agrees to proceed.  LOS: 1 day    Kambrea Carrasco A 07/20/2016

## 2016-07-21 ENCOUNTER — Encounter (HOSPITAL_COMMUNITY): Payer: Self-pay | Admitting: Surgery

## 2016-07-21 MED ORDER — PANTOPRAZOLE SODIUM 40 MG PO TBEC
40.0000 mg | DELAYED_RELEASE_TABLET | Freq: Every day | ORAL | Status: DC
  Administered 2016-07-21 – 2016-07-23 (×3): 40 mg via ORAL
  Filled 2016-07-21 (×4): qty 1

## 2016-07-21 MED ORDER — ALUM & MAG HYDROXIDE-SIMETH 200-200-20 MG/5ML PO SUSP
30.0000 mL | Freq: Four times a day (QID) | ORAL | Status: DC | PRN
  Administered 2016-07-21: 30 mL via ORAL
  Filled 2016-07-21: qty 30

## 2016-07-21 MED ORDER — FLUCONAZOLE 100 MG PO TABS
150.0000 mg | ORAL_TABLET | Freq: Once | ORAL | Status: AC
  Administered 2016-07-21: 150 mg via ORAL
  Filled 2016-07-21: qty 2

## 2016-07-21 MED ORDER — DOCUSATE SODIUM 100 MG PO CAPS
100.0000 mg | ORAL_CAPSULE | Freq: Two times a day (BID) | ORAL | Status: DC
  Administered 2016-07-21 – 2016-07-24 (×7): 100 mg via ORAL
  Filled 2016-07-21 (×7): qty 1

## 2016-07-21 MED ORDER — ENOXAPARIN SODIUM 40 MG/0.4ML ~~LOC~~ SOLN
40.0000 mg | SUBCUTANEOUS | Status: DC
  Administered 2016-07-21 – 2016-07-24 (×4): 40 mg via SUBCUTANEOUS
  Filled 2016-07-21 (×4): qty 0.4

## 2016-07-21 NOTE — Progress Notes (Signed)
1 Day Post-Op   Subjective/Chief Complaint: Less pain in rt chest and arm since surgery  No n/v. ambulated   Objective: Vital signs in last 24 hours: Temp:  [97.2 F (36.2 C)-98.4 F (36.9 C)] 97.5 F (36.4 C) (05/19 0544) Pulse Rate:  [63-103] 63 (05/19 0544) Resp:  [9-18] 17 (05/19 0544) BP: (90-108)/(53-85) 90/55 (05/19 0544) SpO2:  [96 %-100 %] 98 % (05/19 0544) Last BM Date: 07/19/16  Intake/Output from previous day: 05/18 0701 - 05/19 0700 In: 4381.7 [P.O.:360; I.V.:3221.7; IV Piggyback:800] Out: 1350 [Urine:1250; Blood:100] Intake/Output this shift: No intake/output data recorded.  Alert, nontoxic Sitting in bs chair cta b/l Reg +SCDs Rt axilla - dressing c/d/i; mild cellulitis RUE -inner arm  Lab Results:   Recent Labs  07/19/16 2055 07/20/16 0531  WBC 19.0* 17.7*  HGB 12.9 11.6*  HCT 39.0 35.8*  PLT 334 327   BMET  Recent Labs  07/19/16 2055  NA 135  K 3.9  CL 101  CO2 22  GLUCOSE 83  BUN 7  CREATININE 0.97  CALCIUM 9.2   PT/INR No results for input(s): LABPROT, INR in the last 72 hours. ABG No results for input(s): PHART, HCO3 in the last 72 hours.  Invalid input(s): PCO2, PO2  Studies/Results: No results found.  Anti-infectives: Anti-infectives    Start     Dose/Rate Route Frequency Ordered Stop   07/20/16 1200  vancomycin (VANCOCIN) IVPB 1000 mg/200 mL premix     1,000 mg 200 mL/hr over 60 Minutes Intravenous Every 12 hours 07/20/16 0104     07/20/16 0800  piperacillin-tazobactam (ZOSYN) IVPB 3.375 g     3.375 g 12.5 mL/hr over 240 Minutes Intravenous Every 8 hours 07/20/16 0049     07/20/16 0115  vancomycin (VANCOCIN) IVPB 1000 mg/200 mL premix     1,000 mg 200 mL/hr over 60 Minutes Intravenous STAT 07/20/16 0104 07/20/16 0229   07/20/16 0100  piperacillin-tazobactam (ZOSYN) IVPB 3.375 g     3.375 g 100 mL/hr over 30 Minutes Intravenous  Once 07/20/16 0057 07/20/16 0159      Assessment/Plan: s/p  Procedure(s): INCISION AND DRAINAGE ABSCESS right axilla (Right)  Doing well.  Cont IV abx given degree of infection F/u cx HLIV Start chemical vte prophylaxis Ambulate Given how oozy she was in surgery, will delay first dressing change to Sunday  Mary SellaEric M. Andrey CampanileWilson, MD, FACS General, Bariatric, & Minimally Invasive Surgery [facility] [place]   LOS: 2 days    Atilano InaWILSON,Kimberely Mccannon M 07/21/2016

## 2016-07-22 LAB — CBC
HEMATOCRIT: 30.3 % — AB (ref 36.0–46.0)
Hemoglobin: 9.5 g/dL — ABNORMAL LOW (ref 12.0–15.0)
MCH: 28.4 pg (ref 26.0–34.0)
MCHC: 31.4 g/dL (ref 30.0–36.0)
MCV: 90.7 fL (ref 78.0–100.0)
PLATELETS: 317 10*3/uL (ref 150–400)
RBC: 3.34 MIL/uL — AB (ref 3.87–5.11)
RDW: 13.4 % (ref 11.5–15.5)
WBC: 19.3 10*3/uL — AB (ref 4.0–10.5)

## 2016-07-22 MED ORDER — HYDROMORPHONE HCL 1 MG/ML IJ SOLN
INTRAMUSCULAR | Status: AC
  Filled 2016-07-22: qty 1

## 2016-07-22 MED ORDER — HYDROMORPHONE HCL 1 MG/ML IJ SOLN
1.0000 mg | Freq: Once | INTRAMUSCULAR | Status: AC
  Administered 2016-07-22: 1 mg via INTRAVENOUS

## 2016-07-22 NOTE — Progress Notes (Signed)
2 Days Post-Op   Subjective/Chief Complaint: Less pain in axilla. Ambulated. No n/v Soft BP  Objective: Vital signs in last 24 hours: Temp:  [97.5 F (36.4 C)-97.8 F (36.6 C)] 97.5 F (36.4 C) (05/20 0459) Pulse Rate:  [66-77] 77 (05/19 2205) Resp:  [17-18] 18 (05/20 0459) BP: (84-92)/(55-62) 84/55 (05/20 0459) SpO2:  [98 %-100 %] 98 % (05/20 0459) Last BM Date: 07/19/16  Intake/Output from previous day: 05/19 0701 - 05/20 0700 In: 1524 [P.O.:1024; IV Piggyback:500] Out: -  Intake/Output this shift: No intake/output data recorded.  Alert,nad cta b/l Reg Soft, obese Rt axilla - less cellulitis. Still with induration/cellulitis immediately around wound.   Lab Results:   Recent Labs  07/20/16 0531 07/22/16 0429  WBC 17.7* 19.3*  HGB 11.6* 9.5*  HCT 35.8* 30.3*  PLT 327 317   BMET  Recent Labs  07/19/16 2055  NA 135  K 3.9  CL 101  CO2 22  GLUCOSE 83  BUN 7  CREATININE 0.97  CALCIUM 9.2   PT/INR No results for input(s): LABPROT, INR in the last 72 hours. ABG No results for input(s): PHART, HCO3 in the last 72 hours.  Invalid input(s): PCO2, PO2  Studies/Results: No results found.  Anti-infectives: Anti-infectives    Start     Dose/Rate Route Frequency Ordered Stop   07/21/16 2130  fluconazole (DIFLUCAN) tablet 150 mg     150 mg Oral  Once 07/21/16 2051 07/21/16 2150   07/20/16 1200  vancomycin (VANCOCIN) IVPB 1000 mg/200 mL premix     1,000 mg 200 mL/hr over 60 Minutes Intravenous Every 12 hours 07/20/16 0104     07/20/16 0800  piperacillin-tazobactam (ZOSYN) IVPB 3.375 g     3.375 g 12.5 mL/hr over 240 Minutes Intravenous Every 8 hours 07/20/16 0049     07/20/16 0115  vancomycin (VANCOCIN) IVPB 1000 mg/200 mL premix     1,000 mg 200 mL/hr over 60 Minutes Intravenous STAT 07/20/16 0104 07/20/16 0229   07/20/16 0100  piperacillin-tazobactam (ZOSYN) IVPB 3.375 g     3.375 g 100 mL/hr over 30 Minutes Intravenous  Once 07/20/16 0057 07/20/16  0159      Assessment/Plan: s/p Procedure(s): INCISION AND DRAINAGE ABSCESS right axilla (Right)  Soft BP but Hgb stable.  Cont chemical vte prophylaxis Cont IV abx  reqd 2 mg morphine and 1mg  dialudid for dressing change. Mostly dry.  Not ready for dc  Mary Sellaric M. Andrey CampanileWilson, MD, FACS General, Bariatric, & Minimally Invasive Surgery [facility], [place]   LOS: 3 days    Atilano InaWILSON,Yurem Viner M 07/22/2016

## 2016-07-23 LAB — CBC
HCT: 31.2 % — ABNORMAL LOW (ref 36.0–46.0)
Hemoglobin: 9.6 g/dL — ABNORMAL LOW (ref 12.0–15.0)
MCH: 28.5 pg (ref 26.0–34.0)
MCHC: 30.8 g/dL (ref 30.0–36.0)
MCV: 92.6 fL (ref 78.0–100.0)
Platelets: 310 10*3/uL (ref 150–400)
RBC: 3.37 MIL/uL — ABNORMAL LOW (ref 3.87–5.11)
RDW: 14.1 % (ref 11.5–15.5)
WBC: 13.3 10*3/uL — ABNORMAL HIGH (ref 4.0–10.5)

## 2016-07-23 LAB — BASIC METABOLIC PANEL
Anion gap: 9 (ref 5–15)
BUN: 10 mg/dL (ref 6–20)
CO2: 26 mmol/L (ref 22–32)
CREATININE: 1.03 mg/dL — AB (ref 0.44–1.00)
Calcium: 8.5 mg/dL — ABNORMAL LOW (ref 8.9–10.3)
Chloride: 104 mmol/L (ref 101–111)
Glucose, Bld: 87 mg/dL (ref 65–99)
POTASSIUM: 4.3 mmol/L (ref 3.5–5.1)
SODIUM: 139 mmol/L (ref 135–145)

## 2016-07-23 LAB — VANCOMYCIN, TROUGH: VANCOMYCIN TR: 57 ug/mL — AB (ref 15–20)

## 2016-07-23 NOTE — Care Management Note (Signed)
Case Management Note  Patient Details  Name: Courtney ApleyBrandy L Failla MRN: [id number] Date of Birth: 1990-10-20  Subjective/Objective:                    Action/Plan:  Continue to follow for discharge needs , at present needs IV pain medication for dressing changes   Expected Discharge Date:  07/15/16               Expected Discharge Plan:  Home/Self Care  In-House Referral:     Discharge planning Services     Post Acute Care Choice:    Choice offered to:     DME Arranged:    DME Agency:     HH Arranged:    HH Agency:     Status of Service:  In process, will continue to follow  If discussed at Long Length of Stay Meetings, dates discussed:    Additional Comments:  Kingsley PlanWile, Jory Tanguma Marie, RN 07/23/2016, 10:28 AM

## 2016-07-23 NOTE — Progress Notes (Signed)
Pharmacy Antibiotic Note  Courtney ApleyBrandy L Brannen is a 26 y.o. female admitted on 07/19/2016 with R-axilla cellulitis.  Pharmacy has been consulted for Vancomycin dosing.  Pt s/p I&D of R-axilla abscess on 5/18.  Vanc trough high, but drawn 40 minutes after dose was hung (dose given early).    Plan: Continue Vancomycin 1000mg  IV q12 Repeat Vancomycin trough this evening  Height: 5' 0.75" (154.3 cm) Weight: 175 lb 11.2 oz (79.7 kg) IBW/kg (Calculated) : 47.23  Temp (24hrs), Avg:97.9 F (36.6 C), Min:97.7 F (36.5 C), Max:98.1 F (36.7 C)   Recent Labs Lab 07/19/16 2055 07/20/16 0531 07/22/16 0429 07/23/16 0610 07/23/16 1151  WBC 19.0* 17.7* 19.3* 13.3*  --   CREATININE 0.97  --   --  1.03*  --   VANCOTROUGH  --   --   --   --  57*    Estimated Creatinine Clearance: 80.7 mL/min (A) (by C-G formula based on SCr of 1.03 mg/dL (H)).    Allergies  Allergen Reactions  . Shellfish Allergy Anaphylaxis    Antimicrobials this admission: Zosyn 5/18 >>  Vanc 5/18 >>  Septra PTA 5/15 >> 5/18  Dose adjustments this admission: 5/21 VT 57- drawn while dose running  Microbiology results: 5/18 surgical screen - positive MSSA 5/18 right axillary abscess - GPR / GPC / GNR on Gram stain (reincubated) 5/18  HIV nr  Thank you for allowing pharmacy to be a part of this patient's care.  Alvester MorinKendra Kioni Stahl, B.S., PharmD Clinical Pharmacist [facility]

## 2016-07-23 NOTE — Progress Notes (Signed)
[facility] Progress Note  3 Days Post-Op  Subjective: CC: Pain in right axilla Patient states pain is improving and she is feeling better. Is able to lift right arm higher now. Denies n/v, fevers. UOP good. VSS.   Objective: Vital signs in last 24 hours: Temp:  [97.7 F (36.5 C)-98.1 F (36.7 C)] 97.9 F (36.6 C) (05/21 0408) Pulse Rate:  [61-67] 63 (05/21 0408) Resp:  [17-18] 17 (05/21 0408) BP: (78-86)/(51-56) 83/51 (05/21 0408) SpO2:  [97 %-98 %] 97 % (05/21 0408) Last BM Date: 07/19/16  Intake/Output from previous day: 05/20 0701 - 05/21 0700 In: 1355.3 [P.O.:822; I.V.:233.3; IV Piggyback:300] Out: -  Intake/Output this shift: Total I/O In: 220 [P.O.:220] Out: -   PE: Gen:  Alert, NAD, pleasant Card:  Regular rate and rhythm, radial pulses 2+ BL Pulm:  Normal effort, clear to auscultation bilaterally Extremities: Fingers warm and well perfused, strength 5/5 bilaterally. Right axilla wound looks clean with minimal drainage on packing. Induration and erythema around wound improving. Skin: warm and dry, no rashes  Psych: A&Ox3   Lab Results:   Recent Labs  07/22/16 0429 07/23/16 0610  WBC 19.3* 13.3*  HGB 9.5* 9.6*  HCT 30.3* 31.2*  PLT 317 310   BMET  Recent Labs  07/23/16 0610  NA 139  K 4.3  CL 104  CO2 26  GLUCOSE 87  BUN 10  CREATININE 1.03*  CALCIUM 8.5*   PT/INR No results for input(s): LABPROT, INR in the last 72 hours. CMP     Component Value Date/Time   NA 139 07/23/2016 0610   K 4.3 07/23/2016 0610   CL 104 07/23/2016 0610   CO2 26 07/23/2016 0610   GLUCOSE 87 07/23/2016 0610   BUN 10 07/23/2016 0610   CREATININE 1.03 (H) 07/23/2016 0610   CALCIUM 8.5 (L) 07/23/2016 0610   PROT 8.2 05/20/2012 1632   ALBUMIN 4.4 05/20/2012 1632   AST 21 05/20/2012 1632   ALT 16 05/20/2012 1632   ALKPHOS 88 05/20/2012 1632   BILITOT 0.8 05/20/2012 1632   GFRNONAA >60 07/23/2016 0610   GFRAA >60 07/23/2016 0610   Lipase  No  results found for: LIPASE     Studies/Results: No results found.  Anti-infectives: Anti-infectives    Start     Dose/Rate Route Frequency Ordered Stop   07/21/16 2130  fluconazole (DIFLUCAN) tablet 150 mg     150 mg Oral  Once 07/21/16 2051 07/21/16 2150   07/20/16 1200  vancomycin (VANCOCIN) IVPB 1000 mg/200 mL premix     1,000 mg 200 mL/hr over 60 Minutes Intravenous Every 12 hours 07/20/16 0104     07/20/16 0800  piperacillin-tazobactam (ZOSYN) IVPB 3.375 g     3.375 g 12.5 mL/hr over 240 Minutes Intravenous Every 8 hours 07/20/16 0049     07/20/16 0115  vancomycin (VANCOCIN) IVPB 1000 mg/200 mL premix     1,000 mg 200 mL/hr over 60 Minutes Intravenous STAT 07/20/16 0104 07/20/16 0229   07/20/16 0100  piperacillin-tazobactam (ZOSYN) IVPB 3.375 g     3.375 g 100 mL/hr over 30 Minutes Intravenous  Once 07/20/16 0057 07/20/16 0159       Assessment/Plan S/P Incision and Drainage Abscess right axilla 5/18 Dr Magnus Ivan - WBC significantly improved - 13.3 from 19.3 yesterday - H/H stable - BP soft but stable - wet to dry BID, let patient shower prior to redressing.  - less pain with dressing change this AM  FEN - regular diet, pain  control  VTE - lovenox ID - on Vanc (5/18 >) and Zosyn (5/18>)  LOS: 4 days    Wells GuilesKelly Rayburn , [facility] 07/23/2016, 11:18 AM Pager: [phone] Consults: [phone] Mon-Fri 7:00 am-4:30 pm Sat-Sun 7:00 am-11:30 am

## 2016-07-24 LAB — VANCOMYCIN, TROUGH: VANCOMYCIN TR: 19 ug/mL (ref 15–20)

## 2016-07-24 MED ORDER — ACETAMINOPHEN 500 MG PO TABS
1000.0000 mg | ORAL_TABLET | Freq: Three times a day (TID) | ORAL | Status: DC
  Administered 2016-07-24: 1000 mg via ORAL
  Filled 2016-07-24: qty 2

## 2016-07-24 MED ORDER — OXYCODONE HCL 5 MG PO TABS: 5.0000 mg | ORAL_TABLET | ORAL | 0 refills | Status: DC | PRN

## 2016-07-24 MED ORDER — VANCOMYCIN HCL IN DEXTROSE 750-5 MG/150ML-% IV SOLN
750.0000 mg | Freq: Two times a day (BID) | INTRAVENOUS | Status: DC
  Filled 2016-07-24: qty 150

## 2016-07-24 MED ORDER — AMOXICILLIN-POT CLAVULANATE 875-125 MG PO TABS: 1.0000 | ORAL_TABLET | Freq: Two times a day (BID) | ORAL | Status: DC

## 2016-07-24 MED ORDER — CHOLECALCIFEROL 10 MCG (400 UNIT) PO TABS
400.0000 [IU] | ORAL_TABLET | Freq: Every day | ORAL | Status: DC
  Administered 2016-07-24: 400 [IU] via ORAL
  Filled 2016-07-24: qty 1

## 2016-07-24 MED ORDER — SACCHAROMYCES BOULARDII 250 MG PO CAPS: ORAL_CAPSULE | ORAL | Status: DC

## 2016-07-24 MED ORDER — SACCHAROMYCES BOULARDII 250 MG PO CAPS: 250.0000 mg | ORAL_CAPSULE | Freq: Two times a day (BID) | ORAL | Status: DC

## 2016-07-24 MED ORDER — AMOXICILLIN-POT CLAVULANATE 875-125 MG PO TABS: 1.0000 | ORAL_TABLET | Freq: Two times a day (BID) | ORAL | 0 refills | Status: DC

## 2016-07-24 MED ORDER — ACETAMINOPHEN 500 MG PO TABS: ORAL_TABLET | ORAL | 0 refills | Status: DC

## 2016-07-24 MED ORDER — OXYCODONE HCL 5 MG PO TABS
5.0000 mg | ORAL_TABLET | ORAL | Status: DC | PRN
  Administered 2016-07-24: 10 mg via ORAL
  Filled 2016-07-24: qty 2

## 2016-07-24 MED ORDER — IBUPROFEN 600 MG PO TABS: 600.0000 mg | ORAL_TABLET | Freq: Four times a day (QID) | ORAL | Status: DC | PRN

## 2016-07-24 MED ORDER — MUPIROCIN 2 % EX OINT: TOPICAL_OINTMENT | CUTANEOUS | 0 refills | Status: DC

## 2016-07-24 MED ORDER — IBUPROFEN 200 MG PO TABS: ORAL_TABLET | ORAL | Status: DC

## 2016-07-24 NOTE — Care Management Note (Signed)
Case Management Note  Patient Details  Name: Courtney ApleyBrandy L [name] MRN: [id number] Date of Birth: 11-May-1990  Subjective/Objective:                    Action/Plan:   Expected Discharge Date:  07/15/16               Expected Discharge Plan:  Home w Home Health Services  In-House Referral:     Discharge planning Services  CM Consult  Post Acute Care Choice:  Home Health Choice offered to:  Patient, Parent  DME Arranged:    DME Agency:     HH Arranged:    HH [facility]  Status of Service:  Completed, signed off  If discussed at MicrosoftLong Length of Stay Meetings, dates discussed:    Additional Comments:  Kingsley PlanWile, Girard Koontz Marie, RN 07/24/2016, 8:56 AM

## 2016-07-24 NOTE — Progress Notes (Signed)
[facility] Progress Note  4 Days Post-Op  Subjective: CC: axillary abscess Pain improving. Still requiring morphine prior to dressing changes. Tolerating PO. No mobilizing much. Denies fever/chills.  Objective: Vital signs in last 24 hours: Temp:  [98.2 F (36.8 C)-98.7 F (37.1 C)] 98.5 F (36.9 C) (05/22 0456) Pulse Rate:  [71-79] 71 (05/22 0456) Resp:  [17] 17 (05/22 0456) BP: (91-97)/(49-57) 91/55 (05/22 0456) SpO2:  [98 %-100 %] 98 % (05/22 0456) Last BM Date: 07/19/16  Intake/Output from previous day: 05/21 0701 - 05/22 0700 In: 1666.7 [P.O.:800; I.V.:266.7; IV Piggyback:600] Out: -  Intake/Output this shift: No intake/output data recorded.  PE: Gen:  Alert, NAD, pleasant Card:  Regular rate and rhythm, radial pulses 2+ BL Pulm:  Normal effort, clear to auscultation bilaterally Extremities: Fingers WWP, strength 5/5 bilaterally. Right axillary wound looks clean with minimal drainage. Induration and erythema around wound improving. Skin: warm and dry, no rashes  Psych: A&Ox3  Lab Results:   Recent Labs  07/22/16 0429 07/23/16 0610  WBC 19.3* 13.3*  HGB 9.5* 9.6*  HCT 30.3* 31.2*  PLT 317 310   BMET  Recent Labs  07/23/16 0610  NA 139  K 4.3  CL 104  CO2 26  GLUCOSE 87  BUN 10  CREATININE 1.03*  CALCIUM 8.5*   PT/INR No results for input(s): LABPROT, INR in the last 72 hours. CMP     Component Value Date/Time   NA 139 07/23/2016 0610   K 4.3 07/23/2016 0610   CL 104 07/23/2016 0610   CO2 26 07/23/2016 0610   GLUCOSE 87 07/23/2016 0610   BUN 10 07/23/2016 0610   CREATININE 1.03 (H) 07/23/2016 0610   CALCIUM 8.5 (L) 07/23/2016 0610   PROT 8.2 05/20/2012 1632   ALBUMIN 4.4 05/20/2012 1632   AST 21 05/20/2012 1632   ALT 16 05/20/2012 1632   ALKPHOS 88 05/20/2012 1632   BILITOT 0.8 05/20/2012 1632   GFRNONAA >60 07/23/2016 0610   GFRAA >60 07/23/2016 0610   Lipase  No results found for: LIPASE     Studies/Results: No  results found.  Anti-infectives: Anti-infectives    Start     Dose/Rate Route Frequency Ordered Stop   07/24/16 1200  vancomycin (VANCOCIN) IVPB 750 mg/150 ml premix     750 mg 150 mL/hr over 60 Minutes Intravenous Every 12 hours 07/24/16 0052     07/21/16 2130  fluconazole (DIFLUCAN) tablet 150 mg     150 mg Oral  Once 07/21/16 2051 07/21/16 2150   07/20/16 1200  vancomycin (VANCOCIN) IVPB 1000 mg/200 mL premix  Status:  Discontinued     1,000 mg 200 mL/hr over 60 Minutes Intravenous Every 12 hours 07/20/16 0104 07/24/16 0238   07/20/16 0800  piperacillin-tazobactam (ZOSYN) IVPB 3.375 g     3.375 g 12.5 mL/hr over 240 Minutes Intravenous Every 8 hours 07/20/16 0049     07/20/16 0115  vancomycin (VANCOCIN) IVPB 1000 mg/200 mL premix     1,000 mg 200 mL/hr over 60 Minutes Intravenous STAT 07/20/16 0104 07/20/16 0229   07/20/16 0100  piperacillin-tazobactam (ZOSYN) IVPB 3.375 g     3.375 g 100 mL/hr over 30 Minutes Intravenous  Once 07/20/16 0057 07/20/16 0159     Assessment/Plan /P Incision and Drainage Abscess right axilla 5/18 Dr Magnus IvanBlackman - WBC trending down  - H/H stable, BP soft but stable - wet to dry BID, let patient shower with wound open prior to redressing.  - less pain with  dressing change this AM  FEN - regular diet VTE - lovenox ID - on Vanc (5/18 >) and Zosyn (5/18>), Cultures w/ mult organisms and none predominant.  Pain - Schedule tylenol, oxycodone 5-10mg  PRN, Morphine PRN   Plan - PO pain meds prior to dressing change, vit D per pt request, possible PM discharge on PO abx.   LOS: 5 days    Adam Phenix , [facility] 07/24/2016, 7:32 AM Pager: [phone] Consults: [phone] Mon-Fri 7:00 am-4:30 pm Sat-Sun 7:00 am-11:30 am

## 2016-07-24 NOTE — Progress Notes (Signed)
Patient discharged to home with instructions and prescriptions, sent with some dressing supply.

## 2016-07-24 NOTE — Discharge Instructions (Signed)
Skin Abscess A skin abscess is an infected area on or under your skin that contains pus and other material. An abscess can happen almost anywhere on your body. Some abscesses break open (rupture) on their own. Most continue to get worse unless they are treated. The infection can spread deeper into the body and into your blood, which can make you feel sick. Treatment usually involves draining the abscess. Follow these instructions at home: Abscess Care   If you have an abscess that has not drained, place a warm, clean, wet washcloth over the abscess several times a day. Do this as told by your doctor.  Follow instructions from your doctor about how to take care of your abscess. Make sure you:  Cover the abscess with a bandage (dressing).  Change your bandage or gauze as told by your doctor.  Wash your hands with soap and water before you change the bandage or gauze. If you cannot use soap and water, use hand sanitizer.  Check your abscess every day for signs that the infection is getting worse. Check for:  More redness, swelling, or pain.  More fluid or blood.  Warmth.  More pus or a bad smell. Medicines    Take over-the-counter and prescription medicines only as told by your doctor.  If you were prescribed an antibiotic medicine, take it as told by your doctor. Do not stop taking the antibiotic even if you start to feel better. General instructions   To avoid spreading the infection:  Do not share personal care items, towels, or hot tubs with others.  Avoid making skin-to-skin contact with other people.  Keep all follow-up visits as told by your doctor. This is important. Contact a doctor if:  You have more redness, swelling, or pain around your abscess.  You have more fluid or blood coming from your abscess.  Your abscess feels warm when you touch it.  You have more pus or a bad smell coming from your abscess.  You have a fever.  Your muscles ache.  You have  chills.  You feel sick. Get help right away if:  You have very bad (severe) pain.  You see red streaks on your skin spreading away from the abscess. This information is not intended to replace advice given to you by your health care provider. Make sure you discuss any questions you have with your health care provider. Document Released: 08/08/2007 Document Revised: 10/16/2015 Document Reviewed: 12/29/2014 Elsevier Interactive Patient Education  2017 [employer]   Mechanical Wound Debridement Mechanical wound debridement is a treatment to remove dead tissue from a wound. This helps the wound heal. The treatment involves cleaning the wound (irrigation) and using a pad or gauze (dressing) to remove dead tissue and debris from the wound. There are different types of mechanical wound debridement. Depending on the wound, you may need to repeat this procedure or change to another form of debridement as your wound starts to heal. Tell a health care provider about:  Any allergies you have.  All medicines you are taking, including vitamins, herbs, eye drops, creams, and over-the-counter medicines.  Any blood disorders you have.  Any medical conditions you have, including any conditions that:  Cause a significant decrease in blood circulation to the part of the body where the wound is, such as peripheral vascular disease.  Compromise your defense (immune) system or white blood count.  Any surgeries you have had.  Whether you are pregnant or may be pregnant. What are the risks?  Generally, this is a safe procedure. However, problems may occur, including:  Infection.  Bleeding.  Damage to healthy tissue in and around your wound.  Soreness or pain.  Failure of the wound to heal.  Scarring. What happens before the procedure? You may be given antibiotic medicine to help prevent infection. What happens during the procedure?  Your health care provider may apply a numbing medicine  (topical anesthetic) to the wound.  Your health care provider will irrigate your wound with a germ-free (sterile), salt-water (saline) solution. This removes debris, bacteria, and dead tissue.  Depending on what type of mechanical wound debridement you are having, your health care provider may do one of the following:  Put a dressing on your wound. You may have dry gauze pad placed into the wound. Your health care provider will remove the gauze after the wound is dry. Any dead tissue and debris that has dried into the gauze will be lifted out of the wound (wet-to-dry debridement).  Use a type of pad (monofilament fiber debridement pad). This pad has a fluffy surface on one side that picks up dead tissue and debris from your wound. Your health care provider wets the pad and wipes it over your wound for several minutes.  Irrigate your wound with a pressurized stream of solution such as saline or water.  Once your health care provider is finished, he or she may apply a light dressing to your wound. The procedure may vary among health care providers and hospitals. What happens after the procedure?  You may receive medicine for pain.  You will continue to receive antibiotic medicine if it was started before your procedure. This information is not intended to replace advice given to you by your health care provider. Make sure you discuss any questions you have with your health care provider. Document Released: 11/10/2014 Document Revised: 07/28/2015 Document Reviewed: 06/30/2014 Elsevier Interactive Patient Education  2017 [employer].

## 2016-07-24 NOTE — Progress Notes (Signed)
Pharmacy Antibiotic Note  Courtney [name] is a 26 y.o. female admitted on 07/19/2016 with R-axilla cellulitis.  Pharmacy has been consulted for Vancomycin dosing. Pt s/p I&D of R-axilla abscess on 5/18.  5/21 1151 Vanc trough 57- high, but drawn 40 minutes after dose was hung (dose given early) so inaccurate.   5/21 2339 Vanc trough 19 (therapeutic but slightly high for goal range of 10-15 mcg/ml) - drawn appropriately. SCr remains stable.  Plan: Change Vancomycin  to 750mg  IV q12 - starting with tomorrow a.m. dose Will continue to f/u SCr, micro data, and pt's clinical condition Vanc trough at Css on new dose  Height: 5' 0.75" (154.3 cm) Weight: 175 lb 11.2 oz (79.7 kg) IBW/kg (Calculated) : 47.23  Temp (24hrs), Avg:98.3 F (36.8 C), Min:97.9 F (36.6 C), Max:98.7 F (37.1 C)   Recent Labs Lab 07/19/16 2055 07/20/16 0531 07/22/16 0429 07/23/16 0610 07/23/16 1151 07/23/16 2339  WBC 19.0* 17.7* 19.3* 13.3*  --   --   CREATININE 0.97  --   --  1.03*  --   --   VANCOTROUGH  --   --   --   --  57* 19    Estimated Creatinine Clearance: 80.7 mL/min (A) (by C-G formula based on SCr of 1.03 mg/dL (H)).    Allergies  Allergen Reactions  . Shellfish Allergy Anaphylaxis    Antimicrobials this admission: Zosyn 5/18 >>  Vanc 5/18 >>  Septra PTA 5/15 >> 5/18  Dose adjustments this admission: 5/21 VT 57- drawn while dose running 5/21 VT 19 on 1gm IV q12h - change to 750mg  IV q12h  Microbiology results: 5/18 surgical screen - positive MSSA 5/18 right axillary abscess - GPR / GPC / GNR on Gram stain (reincubated) 5/18  HIV nr  Thank you for allowing pharmacy to be a part of this patient's care.  Christoper Fabianaron Jeannie Mallinger, PharmD, BCPS Clinical pharmacist, pager [phone] 07/24/2016 12:48 AM

## 2016-07-25 LAB — AEROBIC/ANAEROBIC CULTURE (SURGICAL/DEEP WOUND)

## 2016-07-25 LAB — AEROBIC/ANAEROBIC CULTURE W GRAM STAIN (SURGICAL/DEEP WOUND)

## 2016-07-25 NOTE — Discharge Summary (Signed)
Physician Discharge Summary  Patient ID: Courtney [name] MRN: [id number] DOB/AGE: 1990-04-25 26 y.o.  Admit date: 07/19/2016 Discharge date: 07/24/2016  Admission Diagnoses:  Right axillary abscess Hx of ADHD  Discharge Diagnoses:  Right axillary abscess Hx of ADHD  Active Problems:   Axillary abscess  PROCEDURES: INCISION AND DRAINAGE ABSCESS right axilla, 07/20/16, Dr. Rikki Spearing Course:   Courtney [name] developed pain and swelling in her right axilla 6 days ago. She has a history of hidradenitis suppurativa and has undergone incision and drainage of abscesses of her axillae in the past. This area got much larger. She was seen by a dermatologist today and they were able to drain it slightly but, due to its size, she was referred to the emergency department. I was asked to see her for further surgical management. She complains of localized pain in the area and it impairs her ability to move her arm very much. It has been draining spontaneously a small amount.  She was admitted and taken to the OR by Dr.Blackman.  Post op she had some pain issues, but her site Cleaned up and she made good progress.  Pain control was her primary issue, and with time and practice this improved.  She was ready discharge home the PM on 07/24/16.  We sent her home on 5 more days of Augmentin.    Specimen Description ABSCESS RIGHT   Special Requests RIGHT AXILLARY ABSCESS SPECIMEN A PATIENT ON FOLLOWING ZOSYN AND VANCOMYCIN   Gram Stain MODERATE WBC PRESENT, PREDOMINANTLY PMN  ABUNDANT GRAM POSITIVE COCCI  FEW GRAM NEGATIVE RODS  RARE GRAM POSITIVE RODS      Culture   MULTIPLE ORGANISMS PRESENT, NONE PREDOMINANT  MIXED ANAEROBIC FLORA PRESENT     CBC Latest Ref Rng & Units 07/23/2016 07/22/2016 07/20/2016  WBC 4.0 - 10.5 K/uL 13.3(H) 19.3(H) 17.7(H)  Hemoglobin 12.0 - 15.0 g/dL 4.0(J) 8.1(X) 11.6(L)  Hematocrit 36.0 - 46.0 % 31.2(L) 30.3(L) 35.8(L)  Platelets 150 - 400 K/uL 310 317 327    CMP  Latest Ref Rng & Units 07/23/2016 07/19/2016 05/20/2012  Glucose 65 - 99 mg/dL 87 83 88  BUN 6 - 20 mg/dL 10 7 10   Creatinine 0.44 - 1.00 mg/dL 9.14(N) 8.29 0.7  Sodium 135 - 145 mmol/L 139 135 138  Potassium 3.5 - 5.1 mmol/L 4.3 3.9 4.2  Chloride 101 - 111 mmol/L 104 101 104  CO2 22 - 32 mmol/L 26 22 26   Calcium 8.9 - 10.3 mg/dL 5.6(O) 9.2 9.7  Total Protein 6.0 - 8.3 g/dL - - 8.2  Total Bilirubin 0.3 - 1.2 mg/dL - - 0.8  Alkaline Phos 39 - 117 U/L - - 88  AST 0 - 37 U/L - - 21  ALT 0 - 35 U/L - - 16   Condition on D/C:  Improved     Disposition: 01-Home or Self Care   Allergies as of 07/24/2016      Reactions   Shellfish Allergy Anaphylaxis      Medication List    STOP taking these medications   oxyCODONE-acetaminophen 5-325 MG tablet Commonly known as:  PERCOCET/ROXICET   sulfamethoxazole-trimethoprim 800-160 MG tablet Commonly known as:  BACTRIM DS,SEPTRA DS     TAKE these medications   acetaminophen 500 MG tablet Commonly known as:  TYLENOL 1000 mg every 8 hours till you do not need pain medicine to move your arm around and use it.  You can buy this over the counter without a prescription.  amoxicillin-clavulanate 875-125 MG tablet Commonly known as:  AUGMENTIN Take 1 tablet by mouth every 12 (twelve) hours.   EPINEPHrine 0.3 mg/0.3 mL Soaj injection Commonly known as:  EPI-PEN Inject 0.3 mg into the muscle as needed (for allergic reaction).   ibuprofen 200 MG tablet Commonly known as:  ADVIL,MOTRIN You can safely take 2-3 tablets every 6 hours for pain as needed.  Use this a second line medication for pain.   You can buy this over the counter at any drug store.   methylphenidate 20 MG/9HR Commonly known as:  DAYTRANA Place 2 patches onto the skin daily. wear patch for 9 hours only each day   montelukast 10 MG tablet Commonly known as:  SINGULAIR TAKE 1 TABLET AT BEDTIME   mupirocin ointment 2 % Commonly known as:  BACTROBAN Use twice a day till it is  completed. Your nurse should give you this to take home.   oxyCODONE 5 MG immediate release tablet Commonly known as:  Oxy IR/ROXICODONE Take 1-2 tablets (5-10 mg total) by mouth every 4 (four) hours as needed for moderate pain or severe pain.   PRESCRIPTION MEDICATION Inject into the muscle every 7 (seven) days. Allergy shot   saccharomyces boulardii 250 MG capsule Commonly known as:  FLORASTOR You can buy this at any drug store over the counter.  Take it till you complete on package.  Follow package instructions.      Follow-up Information    [facility] Follow up.   Specialty:  Home Health Services Why:  Provide home health nurse  Contact information: [address] [place] [address] [phone]        [facility] Follow up on 08/16/2016.   Specialty:  General Surgery Why:  Your appointment is at 3 PM, be at the office 30 minutes early for check in. Contact information: [address] [place] [address] [phone]           Signed: Sherrie GeorgeJENNINGS,Rylie Limburg 07/25/2016, 2:43 PM

## 2016-07-27 DIAGNOSIS — J301 Allergic rhinitis due to pollen: Secondary | ICD-10-CM | POA: Diagnosis not present

## 2016-07-27 DIAGNOSIS — J3089 Other allergic rhinitis: Secondary | ICD-10-CM | POA: Diagnosis not present

## 2016-07-27 DIAGNOSIS — J3081 Allergic rhinitis due to animal (cat) (dog) hair and dander: Secondary | ICD-10-CM | POA: Diagnosis not present

## 2016-08-06 NOTE — Addendum Note (Signed)
Addendum  created 08/06/16 1533 by Brooksie Ellwanger, MD   Sign clinical note    

## 2016-08-10 DIAGNOSIS — J301 Allergic rhinitis due to pollen: Secondary | ICD-10-CM | POA: Diagnosis not present

## 2016-08-10 DIAGNOSIS — J3089 Other allergic rhinitis: Secondary | ICD-10-CM | POA: Diagnosis not present

## 2016-08-10 DIAGNOSIS — J3081 Allergic rhinitis due to animal (cat) (dog) hair and dander: Secondary | ICD-10-CM | POA: Diagnosis not present

## 2016-08-14 ENCOUNTER — Other Ambulatory Visit: Payer: Self-pay

## 2016-08-14 NOTE — Telephone Encounter (Signed)
pts mom left v/m requesting rx daytrana patch. Call when ready for pick up. Last printed# 60 on 07/12/16; last acute visit 04/21/15; do not see recent f/u appt. No future appt scheduled.Please advise.

## 2016-08-15 MED ORDER — METHYLPHENIDATE 20 MG/9HR TD PTCH: 2.0000 | MEDICATED_PATCH | Freq: Every day | TRANSDERMAL | 0 refills | Status: DC

## 2016-08-15 NOTE — Telephone Encounter (Signed)
Left detailed message.  RX placed in blue folder ready for pick up.    

## 2016-08-21 DIAGNOSIS — J301 Allergic rhinitis due to pollen: Secondary | ICD-10-CM | POA: Diagnosis not present

## 2016-08-21 DIAGNOSIS — J3081 Allergic rhinitis due to animal (cat) (dog) hair and dander: Secondary | ICD-10-CM | POA: Diagnosis not present

## 2016-08-21 DIAGNOSIS — T781XXD Other adverse food reactions, not elsewhere classified, subsequent encounter: Secondary | ICD-10-CM | POA: Diagnosis not present

## 2016-08-30 DIAGNOSIS — J301 Allergic rhinitis due to pollen: Secondary | ICD-10-CM | POA: Diagnosis not present

## 2016-08-30 DIAGNOSIS — J3081 Allergic rhinitis due to animal (cat) (dog) hair and dander: Secondary | ICD-10-CM | POA: Diagnosis not present

## 2016-08-30 DIAGNOSIS — J3089 Other allergic rhinitis: Secondary | ICD-10-CM | POA: Diagnosis not present

## 2016-08-30 DIAGNOSIS — L732 Hidradenitis suppurativa: Secondary | ICD-10-CM | POA: Diagnosis not present

## 2016-08-30 DIAGNOSIS — L910 Hypertrophic scar: Secondary | ICD-10-CM | POA: Diagnosis not present

## 2016-09-06 DIAGNOSIS — J069 Acute upper respiratory infection, unspecified: Secondary | ICD-10-CM | POA: Diagnosis not present

## 2016-09-08 ENCOUNTER — Ambulatory Visit (HOSPITAL_COMMUNITY)
Admission: EM | Admit: 2016-09-08 | Discharge: 2016-09-08 | Disposition: A | Discharge status: Home / Self Care | Payer: 59 | Attending: Family Medicine | Admitting: Family Medicine

## 2016-09-08 ENCOUNTER — Encounter (HOSPITAL_COMMUNITY): Payer: Self-pay | Admitting: Emergency Medicine

## 2016-09-08 DIAGNOSIS — J209 Acute bronchitis, unspecified: Secondary | ICD-10-CM | POA: Diagnosis not present

## 2016-09-08 MED ORDER — HYDROCOD POLST-CPM POLST ER 10-8 MG/5ML PO SUER: 5.0000 mL | Freq: Two times a day (BID) | ORAL | 0 refills | Status: AC | PRN

## 2016-09-08 NOTE — ED Provider Notes (Signed)
CSN: [id number]     Arrival date & time 09/08/16  1602 History   First MD Initiated Contact with Patient 09/08/16 1754     Chief Complaint  Patient presents with  . Cough   (Consider location/radiation/quality/duration/timing/severity/associated sxs/prior Treatment) The history is provided by the patient.  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 ADHD (attention deficit hyperactivity disorder)   . Chronic tonsillitis 08/2013   current strep, will finish antibiotic 09/04/2013; snores during sleep, mother denies apnea   Past Surgical History:  Procedure Laterality Date  . INCISION AND DRAINAGE ABSCESS Right 07/20/2016   Procedure: INCISION AND DRAINAGE ABSCESS right axilla;  Surgeon: Abigail Miyamoto, MD;  Location: [facility] OR;  Service: General;  Laterality: Right;  . TONSILLECTOMY Bilateral 09/14/2013   Procedure: BILATERAL TONSILLECTOMY;  Surgeon: Serena Colonel, MD;  Location: [facility];  Service: ENT;  Laterality: Bilateral;  . TYMPANOPLASTY Bilateral   . WISDOM TOOTH EXTRACTION     Family History  Problem Relation Age of Onset  . Hypertension Mother   . Hyperlipidemia Mother    Social History  Substance Use Topics  . Smoking status: Never Smoker  . Smokeless tobacco: Never Used  . Alcohol use Yes     Comment: occasionally   OB History    No data  available     Review of Systems  Constitutional: Positive for fever. Negative for chills.  HENT: Positive for sore throat.   Respiratory: Positive for cough. Negative for shortness of breath.   Cardiovascular: Positive for chest pain.  Skin: Negative for rash.  Neurological: Positive for headaches.    Allergies  Shellfish allergy  Home Medications   Prior to Admission medications   Medication Sig Start Date End Date Taking? Authorizing Provider  EPINEPHrine 0.3 mg/0.3 mL IJ SOAJ injection Inject 0.3 mg into the muscle as needed (for allergic reaction).   Yes [provider]  etonogestrel (NEXPLANON) 68 MG IMPL implant 1 each by Subdermal route once.   Yes [provider]  ibuprofen (ADVIL,MOTRIN) 200 MG tablet You can safely take 2-3 tablets every 6 hours for pain as needed.  Use this a second line medication for pain.   You can buy this over the counter at any drug store. 07/24/16  Yes Sherrie George, PA-C  methylphenidate [facility]) 20 MG/9HR Place 2 patches onto the skin daily. wear patch for 9 hours only each day 08/15/16  Yes Dianne Dun, MD  montelukast (SINGULAIR) 10 MG tablet TAKE 1 TABLET AT BEDTIME 02/06/16  Yes Dianne Dun, MD  PRESCRIPTION MEDICATION Inject into the muscle every 7 (seven) days. Allergy shot   Yes [provider]  saccharomyces boulardii (FLORASTOR) 250 MG capsule You can buy this at any drug store over the counter.  Take it till you complete on package.  Follow package instructions. 07/24/16  Yes Sherrie George, PA-C  acetaminophen (TYLENOL) 500 MG tablet 1000  mg every 8 hours till you do not need pain medicine to move your arm around and use it.  You can buy this over the counter without a prescription. 07/24/16   Sherrie GeorgeJennings, Willard, PA-C  amoxicillin-clavulanate (AUGMENTIN) 875-125 MG tablet Take 1 tablet by mouth every 12 (twelve) hours. 07/24/16   Sherrie GeorgeJennings, Willard, PA-C  chlorpheniramine-HYDROcodone [facility](TUSSIONEX PENNKINETIC ER)  10-8 MG/5ML SUER Take 5 mLs by mouth every 12 (twelve) hours as needed for cough. 09/08/16 09/15/16  Lucia EstelleZheng, Arrayah Connors, NP  mupirocin ointment (BACTROBAN) 2 % Use twice a day till it is completed. Your nurse should give you this to take home. 07/24/16   Sherrie GeorgeJennings, Willard, PA-C  oxyCODONE (OXY IR/ROXICODONE) 5 MG immediate release tablet Take 1-2 tablets (5-10 mg total) by mouth every 4 (four) hours as needed for moderate pain or severe pain. 07/24/16   Sherrie GeorgeJennings, Willard, PA-C   Meds Ordered and Administered this Visit  Medications - No data to display  BP 114/72 (BP Location: Left Arm)   Pulse 91   Temp 98.9 F (37.2 C) (Oral)   Resp 14   SpO2 100%  No data found.   Physical Exam  Constitutional: She is oriented to person, place, and time. She appears well-developed and well-nourished.  HENT:  Head: Normocephalic and atraumatic.  Right Ear: External ear normal.  Left Ear: External ear normal.  Nose: Nose normal.  Mouth/Throat: Oropharynx is clear and moist. No oropharyngeal exudate.  Eyes: Conjunctivae are normal. Pupils are equal, round, and reactive to light.  Neck: Normal range of motion. Neck supple.  Cardiovascular: Normal rate and regular rhythm.  Exam reveals no gallop.   Pulmonary/Chest: Effort normal and breath sounds normal. She has no wheezes.  Dry coughing in room   Abdominal: Soft. Bowel sounds are normal. There is no tenderness.  Neurological: She is alert and oriented to person, place, and time.  Skin: Skin is warm.  Nursing note and vitals reviewed.   Urgent Care Course     Procedures (including critical care time)  Labs Review Labs Reviewed - No data to display  Imaging Review No results found.  MDM   1. Acute bronchitis, unspecified organism    RX for Tussionex Given. Education provided. May continue proair inhaler PRN.  Reviewed directions for usage and side effects. Patient states understanding and will call with questions or problems. Patient instructed to  call or follow up with his/her primary care doctor if failure to improve or change in symptoms. Discharge instruction given.     Lucia EstelleZheng, Tejah Brekke, NP 09/08/16 1810

## 2016-09-08 NOTE — ED Triage Notes (Signed)
Pt c/o nonprodcutive cough x 1 week. Pt was seen at [facility] and given tesslon perles and albuterol with no relief. Also has nasal drainage and sneezing. Pt is in NAD.

## 2016-09-10 DIAGNOSIS — J301 Allergic rhinitis due to pollen: Secondary | ICD-10-CM | POA: Diagnosis not present

## 2016-09-10 DIAGNOSIS — J3089 Other allergic rhinitis: Secondary | ICD-10-CM | POA: Diagnosis not present

## 2016-09-10 DIAGNOSIS — J3081 Allergic rhinitis due to animal (cat) (dog) hair and dander: Secondary | ICD-10-CM | POA: Diagnosis not present

## 2016-09-13 DIAGNOSIS — J3081 Allergic rhinitis due to animal (cat) (dog) hair and dander: Secondary | ICD-10-CM | POA: Diagnosis not present

## 2016-09-13 DIAGNOSIS — J301 Allergic rhinitis due to pollen: Secondary | ICD-10-CM | POA: Diagnosis not present

## 2016-09-14 DIAGNOSIS — J3089 Other allergic rhinitis: Secondary | ICD-10-CM | POA: Diagnosis not present

## 2016-09-18 DIAGNOSIS — J3089 Other allergic rhinitis: Secondary | ICD-10-CM | POA: Diagnosis not present

## 2016-09-18 DIAGNOSIS — J301 Allergic rhinitis due to pollen: Secondary | ICD-10-CM | POA: Diagnosis not present

## 2016-09-18 DIAGNOSIS — J3081 Allergic rhinitis due to animal (cat) (dog) hair and dander: Secondary | ICD-10-CM | POA: Diagnosis not present

## 2016-09-19 ENCOUNTER — Ambulatory Visit (HOSPITAL_COMMUNITY)
Admission: EM | Admit: 2016-09-19 | Discharge: 2016-09-19 | Disposition: A | Discharge status: Home / Self Care | Payer: 59 | Attending: Internal Medicine | Admitting: Internal Medicine

## 2016-09-19 ENCOUNTER — Encounter (HOSPITAL_COMMUNITY): Payer: Self-pay | Admitting: Emergency Medicine

## 2016-09-19 ENCOUNTER — Ambulatory Visit (INDEPENDENT_AMBULATORY_CARE_PROVIDER_SITE_OTHER): Payer: 59

## 2016-09-19 DIAGNOSIS — J40 Bronchitis, not specified as acute or chronic: Secondary | ICD-10-CM | POA: Diagnosis not present

## 2016-09-19 DIAGNOSIS — R05 Cough: Secondary | ICD-10-CM

## 2016-09-19 DIAGNOSIS — M2740 Unspecified cyst of jaw: Secondary | ICD-10-CM

## 2016-09-19 DIAGNOSIS — R059 Cough, unspecified: Secondary | ICD-10-CM

## 2016-09-19 MED ORDER — FLUCONAZOLE 200 MG PO TABS: 200.0000 mg | ORAL_TABLET | Freq: Every day | ORAL | 0 refills | Status: AC

## 2016-09-19 MED ORDER — BENZOYL PEROXIDE 10 % EX GEL: Freq: Every day | CUTANEOUS | 0 refills | Status: DC

## 2016-09-19 MED ORDER — TETRACYCLINE HCL 250 MG PO CAPS: 250.0000 mg | ORAL_CAPSULE | Freq: Four times a day (QID) | ORAL | 0 refills | Status: DC

## 2016-09-19 NOTE — ED Triage Notes (Signed)
Pt was diagnosed with bronchitis 2 weeks ago. Cough has improved, but she still has coughing fits. PT would like a chest xray due to soreness in chest.

## 2016-09-19 NOTE — ED Provider Notes (Addendum)
CSN: [id number]     Arrival date & time 09/19/16  1740 History   First MD Initiated Contact with Patient 09/19/16 1840     Chief Complaint  Patient presents with  . Cough   (Consider location/radiation/quality/duration/timing/severity/associated sxs/prior Treatment) Was seen 13 days ago for bronchitis pt was told that if sx are worse that she can return to have a chest x ray completed. Pt denies any change. States that the cough is still there besides her taking meds as prescribed. Denies any fever, no increase or thickness of mucous.   Pt also has a area to LT jaw line that per pt is a cyst from acne and she gets them frequent. Her dermatology places her on abx but she can not get in to see them for a few weeks.       Past Medical History:  Diagnosis Date  . ADHD (attention deficit hyperactivity disorder)   . Chronic tonsillitis 08/2013   current strep, will finish antibiotic 09/04/2013; snores during sleep, mother denies apnea   Past Surgical History:  Procedure Laterality Date  . INCISION AND DRAINAGE ABSCESS Right 07/20/2016   Procedure: INCISION AND DRAINAGE ABSCESS right axilla;  Surgeon: Abigail Miyamoto, MD;  Location: [facility] OR;  Service: General;  Laterality: Right;  . TONSILLECTOMY Bilateral 09/14/2013   Procedure: BILATERAL TONSILLECTOMY;  Surgeon: Serena Colonel, MD;  Location: [facility];  Service: ENT;  Laterality: Bilateral;  . TYMPANOPLASTY Bilateral   . WISDOM TOOTH EXTRACTION     Family History  Problem Relation Age of Onset  . Hypertension Mother   . Hyperlipidemia Mother    Social History  Substance Use Topics  . Smoking status: Never Smoker  . Smokeless tobacco: Never Used  . Alcohol use Yes     Comment: occasionally   OB History    No data available     Review of Systems  Constitutional: Positive for appetite change.  HENT: Positive for sinus pressure.   Eyes: Negative.   Respiratory: Positive for cough.   Cardiovascular: Negative.    Gastrointestinal: Negative.   Endocrine: Negative.   Genitourinary: Negative.   Musculoskeletal: Negative.   Skin:       Raised area / acne to LT jaw line redness   Neurological: Negative.     Allergies  Shellfish allergy  Home Medications   Prior to Admission medications   Medication Sig Start Date End Date Taking? Authorizing Provider  acetaminophen (TYLENOL) 500 MG tablet 1000 mg every 8 hours till you do not need pain medicine to move your arm around and use it.  You can buy this over the counter without a prescription. 07/24/16   Sherrie George, PA-C  amoxicillin-clavulanate (AUGMENTIN) 875-125 MG tablet Take 1 tablet by mouth every 12 (twelve) hours. 07/24/16   Sherrie George, PA-C  benzoyl peroxide 10 % gel Apply topically daily. 09/19/16   Maple Mirza A, NP  EPINEPHrine 0.3 mg/0.3 mL IJ SOAJ injection Inject 0.3 mg into the muscle as needed (for allergic reaction).    [provider]  etonogestrel (NEXPLANON) 68 MG IMPL implant 1 each by Subdermal route once.    [provider]  ibuprofen (ADVIL,MOTRIN) 200 MG tablet You can safely take 2-3 tablets every 6 hours for pain as needed.  Use this a second line medication for pain.   You can buy this over the counter at any drug store. 07/24/16   Sherrie George, PA-C  methylphenidate [facility]) 20 MG/9HR Place 2 patches onto the skin  daily. wear patch for 9 hours only each day 08/15/16   Dianne DunAron, Talia M, MD  montelukast (SINGULAIR) 10 MG tablet TAKE 1 TABLET AT BEDTIME 02/06/16   Dianne DunAron, Talia M, MD  mupirocin ointment (BACTROBAN) 2 % Use twice a day till it is completed. Your nurse should give you this to take home. 07/24/16   Sherrie GeorgeJennings, Willard, PA-C  oxyCODONE (OXY IR/ROXICODONE) 5 MG immediate release tablet Take 1-2 tablets (5-10 mg total) by mouth every 4 (four) hours as needed for moderate pain or severe pain. 07/24/16   Sherrie GeorgeJennings, Willard, PA-C  PRESCRIPTION MEDICATION Inject into the muscle every 7 (seven)  days. Allergy shot    [provider]  saccharomyces boulardii (FLORASTOR) 250 MG capsule You can buy this at any drug store over the counter.  Take it till you complete on package.  Follow package instructions. 07/24/16   Sherrie GeorgeJennings, Willard, PA-C  tetracycline (ACHROMYCIN,SUMYCIN) 250 MG capsule Take 1 capsule (250 mg total) by mouth 4 (four) times daily. 09/19/16   Tobi BastosMitchell, Madhuri Vacca A, NP   Meds Ordered and Administered this Visit  Medications - No data to display  BP 124/71   Pulse 82   Temp 98.8 F (37.1 C) (Oral)   Resp 16   Ht 5\' 1"  (1.549 m)   Wt 172 lb (78 kg)   SpO2 100%   BMI 32.50 kg/m  No data found.   Physical Exam  Constitutional: She appears well-developed.  HENT:  Head: Normocephalic.  Right Ear: External ear normal.  Left Ear: External ear normal.  Nose: Nose normal.  Mouth/Throat: Oropharynx is clear and moist.  Eyes: Pupils are equal, round, and reactive to light.  Neck: Normal range of motion.  Cardiovascular: Regular rhythm.   Pulmonary/Chest: Effort normal and breath sounds normal.  Abdominal: Soft.  Musculoskeletal: Normal range of motion.  Neurological: She is alert.  Skin: There is erythema.  Dime like area to lt jaw line slighly raised, yellow fluid in area of pin point head.     Urgent Care Course     Procedures (including critical care time)  Labs Review Labs Reviewed - No data to display  Imaging Review Dg Chest 2 View  Result Date: 09/19/2016 CLINICAL DATA:  Fever and cough EXAM: CHEST  2 VIEW COMPARISON:  None. FINDINGS: Lungs are clear. Heart size and pulmonary vascularity are normal. No adenopathy. No pneumothorax. No evident bone lesions. IMPRESSION: No edema or consolidation. Electronically Signed   By: Bretta BangWilliam  Woodruff III M.D.   On: 09/19/2016 19:19             MDM   1. Cough   2. Bronchitis   3. Cyst of jaw   wash and dry face well do not pick at the area F/u with dermatology you currently see Take the  cough med's as prescribed and inhaler  May use humidifier at night to help  X ray was normal  Pt asked for diflucan due to she does get yeast infections with abx treatment  Reviewed previous chart     Tobi BastosMitchell, Marelin Tat A, NP 09/19/16 1925    Tobi BastosMitchell, Migel Hannis A, NP 09/19/16 1928

## 2016-09-25 ENCOUNTER — Other Ambulatory Visit: Payer: Self-pay

## 2016-09-25 MED ORDER — METHYLPHENIDATE 20 MG/9HR TD PTCH: 2.0000 | MEDICATED_PATCH | Freq: Every day | TRANSDERMAL | 0 refills | Status: DC

## 2016-09-25 NOTE — Telephone Encounter (Signed)
Left message. RX placed in blue folder ready for pick up. 

## 2016-09-25 NOTE — Telephone Encounter (Signed)
Last refill 08/15/16 Last OV 05/815 Ok to refill?

## 2016-09-26 DIAGNOSIS — J3081 Allergic rhinitis due to animal (cat) (dog) hair and dander: Secondary | ICD-10-CM | POA: Diagnosis not present

## 2016-09-26 DIAGNOSIS — J301 Allergic rhinitis due to pollen: Secondary | ICD-10-CM | POA: Diagnosis not present

## 2016-09-26 DIAGNOSIS — J3089 Other allergic rhinitis: Secondary | ICD-10-CM | POA: Diagnosis not present

## 2016-10-01 DIAGNOSIS — L723 Sebaceous cyst: Secondary | ICD-10-CM | POA: Diagnosis not present

## 2016-10-01 DIAGNOSIS — L732 Hidradenitis suppurativa: Secondary | ICD-10-CM | POA: Diagnosis not present

## 2016-10-11 DIAGNOSIS — J301 Allergic rhinitis due to pollen: Secondary | ICD-10-CM | POA: Diagnosis not present

## 2016-10-11 DIAGNOSIS — J3081 Allergic rhinitis due to animal (cat) (dog) hair and dander: Secondary | ICD-10-CM | POA: Diagnosis not present

## 2016-10-11 DIAGNOSIS — J3089 Other allergic rhinitis: Secondary | ICD-10-CM | POA: Diagnosis not present

## 2016-10-11 DIAGNOSIS — L723 Sebaceous cyst: Secondary | ICD-10-CM | POA: Diagnosis not present

## 2016-10-11 DIAGNOSIS — L732 Hidradenitis suppurativa: Secondary | ICD-10-CM | POA: Diagnosis not present

## 2016-10-23 DIAGNOSIS — J301 Allergic rhinitis due to pollen: Secondary | ICD-10-CM | POA: Diagnosis not present

## 2016-10-23 DIAGNOSIS — J3089 Other allergic rhinitis: Secondary | ICD-10-CM | POA: Diagnosis not present

## 2016-10-23 DIAGNOSIS — J3081 Allergic rhinitis due to animal (cat) (dog) hair and dander: Secondary | ICD-10-CM | POA: Diagnosis not present

## 2016-10-25 ENCOUNTER — Other Ambulatory Visit: Payer: Self-pay

## 2016-10-25 DIAGNOSIS — J3089 Other allergic rhinitis: Secondary | ICD-10-CM | POA: Diagnosis not present

## 2016-10-25 DIAGNOSIS — J3081 Allergic rhinitis due to animal (cat) (dog) hair and dander: Secondary | ICD-10-CM | POA: Diagnosis not present

## 2016-10-25 DIAGNOSIS — J301 Allergic rhinitis due to pollen: Secondary | ICD-10-CM | POA: Diagnosis not present

## 2016-10-25 MED ORDER — METHYLPHENIDATE 20 MG/9HR TD PTCH: 2.0000 | MEDICATED_PATCH | Freq: Every day | TRANSDERMAL | 0 refills | Status: DC

## 2016-10-25 NOTE — Telephone Encounter (Signed)
LMOM for patient to pickup Rx at the front office

## 2016-10-25 NOTE — Telephone Encounter (Signed)
V/M left requesting rx daytrana patches. Last printed # 60 on 09/25/16. Last seen ADHD 10/18/14. No future appt scheduled.

## 2016-10-29 DIAGNOSIS — J301 Allergic rhinitis due to pollen: Secondary | ICD-10-CM | POA: Diagnosis not present

## 2016-10-29 DIAGNOSIS — J3081 Allergic rhinitis due to animal (cat) (dog) hair and dander: Secondary | ICD-10-CM | POA: Diagnosis not present

## 2016-10-29 DIAGNOSIS — J3089 Other allergic rhinitis: Secondary | ICD-10-CM | POA: Diagnosis not present

## 2016-10-31 DIAGNOSIS — J301 Allergic rhinitis due to pollen: Secondary | ICD-10-CM | POA: Diagnosis not present

## 2016-10-31 DIAGNOSIS — J3081 Allergic rhinitis due to animal (cat) (dog) hair and dander: Secondary | ICD-10-CM | POA: Diagnosis not present

## 2016-10-31 DIAGNOSIS — J3089 Other allergic rhinitis: Secondary | ICD-10-CM | POA: Diagnosis not present

## 2016-11-03 HISTORY — PX: INTRAUTERINE DEVICE (IUD) INSERTION: SHX5877

## 2016-11-08 DIAGNOSIS — L723 Sebaceous cyst: Secondary | ICD-10-CM | POA: Diagnosis not present

## 2016-11-08 DIAGNOSIS — J3089 Other allergic rhinitis: Secondary | ICD-10-CM | POA: Diagnosis not present

## 2016-11-08 DIAGNOSIS — J3081 Allergic rhinitis due to animal (cat) (dog) hair and dander: Secondary | ICD-10-CM | POA: Diagnosis not present

## 2016-11-08 DIAGNOSIS — J301 Allergic rhinitis due to pollen: Secondary | ICD-10-CM | POA: Diagnosis not present

## 2016-11-08 DIAGNOSIS — L732 Hidradenitis suppurativa: Secondary | ICD-10-CM | POA: Diagnosis not present

## 2016-11-14 DIAGNOSIS — J3081 Allergic rhinitis due to animal (cat) (dog) hair and dander: Secondary | ICD-10-CM | POA: Diagnosis not present

## 2016-11-14 DIAGNOSIS — J301 Allergic rhinitis due to pollen: Secondary | ICD-10-CM | POA: Diagnosis not present

## 2016-11-14 DIAGNOSIS — J3089 Other allergic rhinitis: Secondary | ICD-10-CM | POA: Diagnosis not present

## 2016-11-20 DIAGNOSIS — J3089 Other allergic rhinitis: Secondary | ICD-10-CM | POA: Diagnosis not present

## 2016-11-20 DIAGNOSIS — J301 Allergic rhinitis due to pollen: Secondary | ICD-10-CM | POA: Diagnosis not present

## 2016-11-20 DIAGNOSIS — J3081 Allergic rhinitis due to animal (cat) (dog) hair and dander: Secondary | ICD-10-CM | POA: Diagnosis not present

## 2016-11-27 ENCOUNTER — Other Ambulatory Visit: Payer: Self-pay | Admitting: *Deleted

## 2016-11-27 NOTE — Telephone Encounter (Signed)
Last Rx 10/26/2106. Last OV 05/2015-acute

## 2016-11-27 NOTE — Telephone Encounter (Signed)
Okay to print out and place on my desk for signature. 

## 2016-11-28 MED ORDER — METHYLPHENIDATE 20 MG/9HR TD PTCH: 2.0000 | MEDICATED_PATCH | Freq: Every day | TRANSDERMAL | 0 refills | Status: DC

## 2016-12-03 ENCOUNTER — Other Ambulatory Visit (INDEPENDENT_AMBULATORY_CARE_PROVIDER_SITE_OTHER): Payer: 59

## 2016-12-03 ENCOUNTER — Other Ambulatory Visit: Payer: Self-pay | Admitting: Family Medicine

## 2016-12-03 DIAGNOSIS — Z0000 Encounter for general adult medical examination without abnormal findings: Secondary | ICD-10-CM

## 2016-12-03 DIAGNOSIS — Z00129 Encounter for routine child health examination without abnormal findings: Secondary | ICD-10-CM | POA: Insufficient documentation

## 2016-12-03 DIAGNOSIS — Z113 Encounter for screening for infections with a predominantly sexual mode of transmission: Secondary | ICD-10-CM

## 2016-12-03 LAB — LIPID PANEL
CHOLESTEROL: 176 mg/dL (ref 0–200)
HDL: 36.9 mg/dL — AB (ref >39.00)
LDL Cholesterol: 116 mg/dL — ABNORMAL HIGH (ref 0–99)
NonHDL: 139.45
Total CHOL/HDL Ratio: 5
Triglycerides: 118 mg/dL (ref 0.0–149.0)
VLDL: 23.6 mg/dL (ref 0.0–40.0)

## 2016-12-03 LAB — COMPREHENSIVE METABOLIC PANEL
ALBUMIN: 4.1 g/dL (ref 3.5–5.2)
ALK PHOS: 89 U/L (ref 39–117)
ALT: 13 U/L (ref 0–35)
AST: 15 U/L (ref 0–37)
BILIRUBIN TOTAL: 0.4 mg/dL (ref 0.2–1.2)
BUN: 10 mg/dL (ref 6–23)
CO2: 26 mEq/L (ref 19–32)
Calcium: 9.7 mg/dL (ref 8.4–10.5)
Chloride: 103 mEq/L (ref 96–112)
Creatinine, Ser: 0.68 mg/dL (ref 0.40–1.20)
GFR: 113.01 mL/min (ref >60.00)
GLUCOSE: 88 mg/dL (ref 70–99)
POTASSIUM: 3.9 meq/L (ref 3.5–5.1)
Sodium: 139 mEq/L (ref 135–145)
TOTAL PROTEIN: 7.3 g/dL (ref 6.0–8.3)

## 2016-12-03 LAB — CBC WITH DIFFERENTIAL/PLATELET
BASOS ABS: 0.1 10*3/uL (ref 0.0–0.1)
Basophils Relative: 0.7 % (ref 0.0–3.0)
EOS PCT: 1.7 % (ref 0.0–5.0)
Eosinophils Absolute: 0.2 10*3/uL (ref 0.0–0.7)
HCT: 38.8 % (ref 36.0–46.0)
Hemoglobin: 12.8 g/dL (ref 12.0–15.0)
LYMPHS ABS: 4 10*3/uL (ref 0.7–4.0)
LYMPHS PCT: 32.1 % (ref 12.0–46.0)
MCHC: 33 g/dL (ref 30.0–36.0)
MCV: 89.7 fl (ref 78.0–100.0)
Monocytes Absolute: 0.9 10*3/uL (ref 0.1–1.0)
Monocytes Relative: 7.5 % (ref 3.0–12.0)
Neutro Abs: 7.2 10*3/uL (ref 1.4–7.7)
Neutrophils Relative %: 58 % (ref 43.0–77.0)
Platelets: 330 10*3/uL (ref 150.0–400.0)
RBC: 4.33 Mil/uL (ref 3.87–5.11)
RDW: 14.4 % (ref 11.5–15.5)
WBC: 12.4 10*3/uL — ABNORMAL HIGH (ref 4.0–10.5)

## 2016-12-03 LAB — TSH: TSH: 2.45 u[IU]/mL (ref 0.35–4.50)

## 2016-12-03 NOTE — Addendum Note (Signed)
Addended by: Alvina Chou on: 12/03/2016 03:43 PM   Modules accepted: Orders

## 2016-12-04 DIAGNOSIS — J3089 Other allergic rhinitis: Secondary | ICD-10-CM | POA: Diagnosis not present

## 2016-12-04 DIAGNOSIS — J3081 Allergic rhinitis due to animal (cat) (dog) hair and dander: Secondary | ICD-10-CM | POA: Diagnosis not present

## 2016-12-04 DIAGNOSIS — J301 Allergic rhinitis due to pollen: Secondary | ICD-10-CM | POA: Diagnosis not present

## 2016-12-04 LAB — HIV ANTIBODY (ROUTINE TESTING W REFLEX): HIV 1&2 Ab, 4th Generation: NONREACTIVE

## 2016-12-04 LAB — RPR: RPR Ser Ql: NONREACTIVE

## 2016-12-06 ENCOUNTER — Encounter: Payer: Self-pay | Admitting: Family Medicine

## 2016-12-06 ENCOUNTER — Ambulatory Visit (INDEPENDENT_AMBULATORY_CARE_PROVIDER_SITE_OTHER): Payer: 59 | Admitting: Family Medicine

## 2016-12-06 VITALS — BP 100/64 | HR 98 | Temp 97.5°F | Ht 61.0 in | Wt 175.2 lb

## 2016-12-06 DIAGNOSIS — Z0000 Encounter for general adult medical examination without abnormal findings: Secondary | ICD-10-CM | POA: Insufficient documentation

## 2016-12-06 DIAGNOSIS — Z23 Encounter for immunization: Secondary | ICD-10-CM | POA: Diagnosis not present

## 2016-12-06 DIAGNOSIS — Z01419 Encounter for gynecological examination (general) (routine) without abnormal findings: Secondary | ICD-10-CM | POA: Diagnosis not present

## 2016-12-06 NOTE — Progress Notes (Signed)
Subjective:   Patient ID: [name], female    DOB: 11-Sep-1990, 26 y.o.   MRN: [id number]  SEAIRA BYUS is a pleasant 26 y.o. year old female who presents to clinic today with Annual Exam  on 12/06/2016  HPI:  Last pap done by Malva Limes, 03/02/15. Has appointment in February.  Has IUD.    Lab Results  Component Value Date   CHOL 176 12/03/2016   HDL 36.90 (L) 12/03/2016   LDLCALC 116 (H) 12/03/2016   TRIG 118.0 12/03/2016   CHOLHDL 5 12/03/2016   Lab Results  Component Value Date   CREATININE 0.68 12/03/2016   Lab Results  Component Value Date   NA 139 12/03/2016   K 3.9 12/03/2016   CL 103 12/03/2016   CO2 26 12/03/2016   Lab Results  Component Value Date   WBC 12.4 (H) 12/03/2016   HGB 12.8 12/03/2016   HCT 38.8 12/03/2016   MCV 89.7 12/03/2016   PLT 330.0 12/03/2016     Current Outpatient Prescriptions on File Prior to Visit  Medication Sig Dispense Refill  . acetaminophen (TYLENOL) 500 MG tablet 1000 mg every 8 hours till you do not need pain medicine to move your arm around and use it.  You can buy this over the counter without a prescription. 30 tablet 0  . EPINEPHrine 0.3 mg/0.3 mL IJ SOAJ injection Inject 0.3 mg into the muscle as needed (for allergic reaction).    Marland Kitchen etonogestrel (NEXPLANON) 68 MG IMPL implant 1 each by Subdermal route once.    . methylphenidate (DAYTRANA) 20 MG/9HR Place 2 patches onto the skin daily. wear patch for 9 hours only each day 60 patch 0  . montelukast (SINGULAIR) 10 MG tablet TAKE 1 TABLET AT BEDTIME 90 tablet 2  . PRESCRIPTION MEDICATION Inject into the muscle every 7 (seven) days. Allergy shot    . saccharomyces boulardii (FLORASTOR) 250 MG capsule You can buy this at any drug store over the counter.  Take it till you complete on package.  Follow package instructions.     No current facility-administered medications on file prior to visit.     Allergies  Allergen Reactions  . Iodine Nausea Only  .  Shellfish Allergy Anaphylaxis    Past Medical History:  Diagnosis Date  . ADHD (attention deficit hyperactivity disorder)   . Chronic tonsillitis 08/2013   current strep, will finish antibiotic 09/04/2013; snores during sleep, mother denies apnea    Past Surgical History:  Procedure Laterality Date  . INCISION AND DRAINAGE ABSCESS Right 07/20/2016   Procedure: INCISION AND DRAINAGE ABSCESS right axilla;  Surgeon: Abigail Miyamoto, MD;  Location: [facility] OR;  Service: General;  Laterality: Right;  . TONSILLECTOMY Bilateral 09/14/2013   Procedure: BILATERAL TONSILLECTOMY;  Surgeon: Serena Colonel, MD;  Location: [facility];  Service: ENT;  Laterality: Bilateral;  . TYMPANOPLASTY Bilateral   . WISDOM TOOTH EXTRACTION      Family History  Problem Relation Age of Onset  . Hypertension Mother   . Hyperlipidemia Mother     Social History   Social History  . Marital status: Single    Spouse name: N/A  . Number of children: N/A  . Years of education: N/A   Occupational History  . Not on file.   Social History Main Topics  . Smoking status: Never Smoker  . Smokeless tobacco: Never Used  . Alcohol use Yes     Comment: occasionally  . Drug use: No  .  Sexual activity: Not on file   Other Topics Concern  . Not on file   Social History Narrative   Attends [employer]- getting a degree in business and criminal justice  The PMH, PSH, Social History, Family History, Medications, and allergies have been reviewed in [facility], and have been updated if relevant.    Review of Systems  Constitutional: Negative.   HENT: Negative.   Respiratory: Negative.   Cardiovascular: Negative.   Gastrointestinal: Negative.   Endocrine: Negative.   Genitourinary: Negative.   Musculoskeletal: Negative.   Allergic/Immunologic: Negative.   Neurological: Negative.   Hematological: Negative.   Psychiatric/Behavioral: Negative.   All other systems reviewed and are  negative.      Objective:    BP 100/64 (BP Location: Left Arm, Patient Position: Sitting, Cuff Size: Normal)   Pulse 98   Temp (!) 97.5 F (36.4 C) (Oral)   Ht  (1.549 m)   Wt 175 lb 4 oz (79.5 kg)   SpO2 98%   BMI 33.11 kg/m    Physical Exam   General:  Well-developed,well-nourished,in no acute distress; alert,appropriate and cooperative throughout examination Head:  normocephalic and atraumatic.   Eyes:  vision grossly intact, PERRL Ears:  R ear normal and L ear normal externally, TMs clear bilaterally Nose:  no external deformity.   Mouth:  good dentition.   Neck:  No deformities, masses, or tenderness noted. Breasts:  No mass, nodules, thickening, tenderness, bulging, retraction, inflamation, nipple discharge or skin changes noted.   Lungs:  Normal respiratory effort, chest expands symmetrically. Lungs are clear to auscultation, no crackles or wheezes. Heart:  Normal rate and regular rhythm. S1 and S2 normal without gallop, murmur, click, rub or other extra sounds. Abdomen:  Bowel sounds positive,abdomen soft and non-tender without masses, organomegaly or hernias noted. Msk:  No deformity or scoliosis noted of thoracic or lumbar spine.   Extremities:  No clubbing, cyanosis, edema, or deformity noted with normal full range of motion of all joints.   Neurologic:  alert & oriented X3 and gait normal.   Skin:  Intact without suspicious lesions or rashes Cervical Nodes:  No lymphadenopathy noted Axillary Nodes:  No palpable lymphadenopathy Psych:  Cognition and judgment appear intact. Alert and cooperative with normal attention span and concentration. No apparent delusions, illusions, hallucinations        Assessment & Plan:   Well woman exam No Follow-up on file.

## 2016-12-06 NOTE — Patient Instructions (Signed)
Congratulations! Happy birthday!

## 2016-12-06 NOTE — Assessment & Plan Note (Signed)
Reviewed preventive care protocols, scheduled due services, and updated immunizations Discussed nutrition, exercise, diet, and healthy lifestyle.  

## 2016-12-06 NOTE — Addendum Note (Signed)
Addended by: Zollie Pee A on: 12/06/2016 11:24 AM   Modules accepted: Orders

## 2016-12-13 DIAGNOSIS — J3081 Allergic rhinitis due to animal (cat) (dog) hair and dander: Secondary | ICD-10-CM | POA: Diagnosis not present

## 2016-12-13 DIAGNOSIS — J301 Allergic rhinitis due to pollen: Secondary | ICD-10-CM | POA: Diagnosis not present

## 2016-12-13 DIAGNOSIS — J3089 Other allergic rhinitis: Secondary | ICD-10-CM | POA: Diagnosis not present

## 2016-12-19 ENCOUNTER — Encounter (HOSPITAL_COMMUNITY): Payer: Self-pay

## 2016-12-19 DIAGNOSIS — R079 Chest pain, unspecified: Secondary | ICD-10-CM

## 2016-12-19 DIAGNOSIS — G43909 Migraine, unspecified, not intractable, without status migrainosus: Secondary | ICD-10-CM

## 2016-12-19 DIAGNOSIS — Z79899 Other long term (current) drug therapy: Secondary | ICD-10-CM | POA: Insufficient documentation

## 2016-12-19 DIAGNOSIS — F909 Attention-deficit hyperactivity disorder, unspecified type: Secondary | ICD-10-CM | POA: Diagnosis not present

## 2016-12-19 DIAGNOSIS — Z793 Long term (current) use of hormonal contraceptives: Secondary | ICD-10-CM | POA: Diagnosis not present

## 2016-12-19 DIAGNOSIS — R51 Headache: Secondary | ICD-10-CM | POA: Insufficient documentation

## 2016-12-19 DIAGNOSIS — Z792 Long term (current) use of antibiotics: Secondary | ICD-10-CM | POA: Diagnosis not present

## 2016-12-19 DIAGNOSIS — B373 Candidiasis of vulva and vagina: Secondary | ICD-10-CM | POA: Diagnosis not present

## 2016-12-19 DIAGNOSIS — Z87892 Personal history of anaphylaxis: Secondary | ICD-10-CM | POA: Diagnosis not present

## 2016-12-19 DIAGNOSIS — L02411 Cutaneous abscess of right axilla: Secondary | ICD-10-CM

## 2016-12-19 DIAGNOSIS — Z883 Allergy status to other anti-infective agents status: Secondary | ICD-10-CM | POA: Diagnosis not present

## 2016-12-19 DIAGNOSIS — L03111 Cellulitis of right axilla: Secondary | ICD-10-CM

## 2016-12-19 DIAGNOSIS — L732 Hidradenitis suppurativa: Secondary | ICD-10-CM | POA: Diagnosis not present

## 2016-12-19 DIAGNOSIS — Z8249 Family history of ischemic heart disease and other diseases of the circulatory system: Secondary | ICD-10-CM | POA: Diagnosis not present

## 2016-12-19 DIAGNOSIS — Z91013 Allergy to seafood: Secondary | ICD-10-CM | POA: Diagnosis not present

## 2016-12-19 HISTORY — DX: Migraine, unspecified, not intractable, without status migrainosus: G43.909

## 2016-12-19 LAB — CBC WITH DIFFERENTIAL/PLATELET
BASOS ABS: 0 10*3/uL (ref 0.0–0.1)
BASOS PCT: 0 %
EOS ABS: 0.1 10*3/uL (ref 0.0–0.7)
Eosinophils Relative: 1 %
HEMATOCRIT: 39.8 % (ref 36.0–46.0)
HEMOGLOBIN: 13.1 g/dL (ref 12.0–15.0)
Lymphocytes Relative: 17 %
Lymphs Abs: 2.9 10*3/uL (ref 0.7–4.0)
MCH: 29.5 pg (ref 26.0–34.0)
MCHC: 32.9 g/dL (ref 30.0–36.0)
MCV: 89.6 fL (ref 78.0–100.0)
Monocytes Absolute: 0.9 10*3/uL (ref 0.1–1.0)
Monocytes Relative: 5 %
NEUTROS ABS: 13.1 10*3/uL — AB (ref 1.7–7.7)
NEUTROS PCT: 77 %
Platelets: 349 10*3/uL (ref 150–400)
RBC: 4.44 MIL/uL (ref 3.87–5.11)
RDW: 14.1 % (ref 11.5–15.5)
WBC: 17 10*3/uL — ABNORMAL HIGH (ref 4.0–10.5)

## 2016-12-19 LAB — COMPREHENSIVE METABOLIC PANEL
ALBUMIN: 3.7 g/dL (ref 3.5–5.0)
ALT: 13 U/L — ABNORMAL LOW (ref 14–54)
ANION GAP: 9 (ref 5–15)
AST: 18 U/L (ref 15–41)
Alkaline Phosphatase: 118 U/L (ref 38–126)
BILIRUBIN TOTAL: 0.8 mg/dL (ref 0.3–1.2)
BUN: 5 mg/dL — ABNORMAL LOW (ref 6–20)
CO2: 26 mmol/L (ref 22–32)
Calcium: 9.4 mg/dL (ref 8.9–10.3)
Chloride: 103 mmol/L (ref 101–111)
Creatinine, Ser: 0.67 mg/dL (ref 0.44–1.00)
Glucose, Bld: 118 mg/dL — ABNORMAL HIGH (ref 65–99)
POTASSIUM: 3.4 mmol/L — AB (ref 3.5–5.1)
Sodium: 138 mmol/L (ref 135–145)
TOTAL PROTEIN: 7.8 g/dL (ref 6.5–8.1)

## 2016-12-19 LAB — I-STAT CG4 LACTIC ACID, ED: LACTIC ACID, VENOUS: 1.84 mmol/L (ref 0.5–1.9)

## 2016-12-19 LAB — POC URINE PREG, ED: PREG TEST UR: NEGATIVE

## 2016-12-19 LAB — PROTIME-INR
INR: 1.07
PROTHROMBIN TIME: 13.8 s (ref 11.4–15.2)

## 2016-12-19 NOTE — ED Triage Notes (Signed)
Pt arrives to ed with c/o HA for past 2 days; pt state she also had surgery to remove abscess in May but feels like wound has reopened and is infected; pt presents tachy at triage around 120; Pt a&ox 4 and afebrile at triage; pt has some slight drainage from right arm pit;-Monique,RN

## 2016-12-20 ENCOUNTER — Emergency Department (HOSPITAL_COMMUNITY)
Admission: EM | Admit: 2016-12-20 | Discharge: 2016-12-20 | Disposition: A | Discharge status: Home / Self Care | Payer: 59 | Source: Home / Self Care | Attending: Emergency Medicine | Admitting: Emergency Medicine

## 2016-12-20 ENCOUNTER — Encounter (HOSPITAL_COMMUNITY): Payer: Self-pay | Admitting: General Practice

## 2016-12-20 ENCOUNTER — Observation Stay (HOSPITAL_COMMUNITY)
Admission: AD | Admit: 2016-12-20 | Discharge: 2016-12-24 | DRG: 603 | Disposition: A | Discharge status: Home Health | Payer: 59 | Source: Ambulatory Visit | Attending: Surgery | Admitting: Surgery

## 2016-12-20 DIAGNOSIS — F909 Attention-deficit hyperactivity disorder, unspecified type: Secondary | ICD-10-CM | POA: Diagnosis present

## 2016-12-20 DIAGNOSIS — L03111 Cellulitis of right axilla: Secondary | ICD-10-CM

## 2016-12-20 DIAGNOSIS — Z87892 Personal history of anaphylaxis: Secondary | ICD-10-CM | POA: Insufficient documentation

## 2016-12-20 DIAGNOSIS — L02419 Cutaneous abscess of limb, unspecified: Secondary | ICD-10-CM | POA: Diagnosis present

## 2016-12-20 DIAGNOSIS — R519 Headache, unspecified: Secondary | ICD-10-CM

## 2016-12-20 DIAGNOSIS — L732 Hidradenitis suppurativa: Secondary | ICD-10-CM | POA: Diagnosis not present

## 2016-12-20 DIAGNOSIS — Z91013 Allergy to seafood: Secondary | ICD-10-CM

## 2016-12-20 DIAGNOSIS — L02411 Cutaneous abscess of right axilla: Principal | ICD-10-CM | POA: Diagnosis present

## 2016-12-20 DIAGNOSIS — R51 Headache: Secondary | ICD-10-CM

## 2016-12-20 DIAGNOSIS — L0291 Cutaneous abscess, unspecified: Secondary | ICD-10-CM

## 2016-12-20 DIAGNOSIS — Z883 Allergy status to other anti-infective agents status: Secondary | ICD-10-CM

## 2016-12-20 DIAGNOSIS — Z793 Long term (current) use of hormonal contraceptives: Secondary | ICD-10-CM

## 2016-12-20 DIAGNOSIS — Z79899 Other long term (current) drug therapy: Secondary | ICD-10-CM

## 2016-12-20 DIAGNOSIS — B373 Candidiasis of vulva and vagina: Secondary | ICD-10-CM | POA: Diagnosis present

## 2016-12-20 DIAGNOSIS — Z792 Long term (current) use of antibiotics: Secondary | ICD-10-CM

## 2016-12-20 DIAGNOSIS — Z8249 Family history of ischemic heart disease and other diseases of the circulatory system: Secondary | ICD-10-CM | POA: Insufficient documentation

## 2016-12-20 DIAGNOSIS — G43909 Migraine, unspecified, not intractable, without status migrainosus: Secondary | ICD-10-CM | POA: Diagnosis present

## 2016-12-20 HISTORY — DX: Cardiac murmur, unspecified: R01.1

## 2016-12-20 HISTORY — DX: Migraine, unspecified, not intractable, without status migrainosus: G43.909

## 2016-12-20 LAB — CBC WITH DIFFERENTIAL/PLATELET
Basophils Absolute: 0 10*3/uL (ref 0.0–0.1)
Basophils Relative: 0 %
EOS ABS: 0.2 10*3/uL (ref 0.0–0.7)
EOS PCT: 1 %
HCT: 39.3 % (ref 36.0–46.0)
HEMOGLOBIN: 12.7 g/dL (ref 12.0–15.0)
LYMPHS ABS: 3.9 10*3/uL (ref 0.7–4.0)
Lymphocytes Relative: 23 %
MCH: 29.3 pg (ref 26.0–34.0)
MCHC: 32.3 g/dL (ref 30.0–36.0)
MCV: 90.8 fL (ref 78.0–100.0)
MONO ABS: 1.5 10*3/uL — AB (ref 0.1–1.0)
MONOS PCT: 9 %
Neutro Abs: 11.5 10*3/uL — ABNORMAL HIGH (ref 1.7–7.7)
Neutrophils Relative %: 67 %
PLATELETS: 304 10*3/uL (ref 150–400)
RBC: 4.33 MIL/uL (ref 3.87–5.11)
RDW: 14 % (ref 11.5–15.5)
WBC: 17.1 10*3/uL — ABNORMAL HIGH (ref 4.0–10.5)

## 2016-12-20 LAB — I-STAT TROPONIN, ED: Troponin i, poc: 0 ng/mL (ref 0.00–0.08)

## 2016-12-20 LAB — BASIC METABOLIC PANEL
Anion gap: 8 (ref 5–15)
CHLORIDE: 104 mmol/L (ref 101–111)
CO2: 26 mmol/L (ref 22–32)
CREATININE: 0.62 mg/dL (ref 0.44–1.00)
Calcium: 8.9 mg/dL (ref 8.9–10.3)
GFR calc Af Amer: >60 mL/min (ref >60)
GFR calc non Af Amer: >60 mL/min (ref >60)
GLUCOSE: 104 mg/dL — AB (ref 65–99)
Potassium: 3.9 mmol/L (ref 3.5–5.1)
Sodium: 138 mmol/L (ref 135–145)

## 2016-12-20 LAB — I-STAT CG4 LACTIC ACID, ED: Lactic Acid, Venous: 1.54 mmol/L (ref 0.5–1.9)

## 2016-12-20 MED ORDER — PIPERACILLIN-TAZOBACTAM 3.375 G IVPB 30 MIN
3.3750 g | Freq: Once | INTRAVENOUS | Status: AC
  Administered 2016-12-20: 3.375 g via INTRAVENOUS
  Filled 2016-12-20: qty 50

## 2016-12-20 MED ORDER — VANCOMYCIN HCL IN DEXTROSE 1-5 GM/200ML-% IV SOLN
1000.0000 mg | Freq: Two times a day (BID) | INTRAVENOUS | Status: DC
  Administered 2016-12-21 – 2016-12-24 (×7): 1000 mg via INTRAVENOUS
  Filled 2016-12-20 (×10): qty 200

## 2016-12-20 MED ORDER — ACETAMINOPHEN 325 MG PO TABS
650.0000 mg | ORAL_TABLET | Freq: Four times a day (QID) | ORAL | Status: DC | PRN
  Administered 2016-12-20 – 2016-12-24 (×2): 650 mg via ORAL
  Filled 2016-12-20 (×2): qty 2

## 2016-12-20 MED ORDER — DOCUSATE SODIUM 100 MG PO CAPS
100.0000 mg | ORAL_CAPSULE | Freq: Two times a day (BID) | ORAL | Status: DC
  Administered 2016-12-22: 100 mg via ORAL
  Filled 2016-12-20 (×7): qty 1

## 2016-12-20 MED ORDER — ONDANSETRON HCL 4 MG/2ML IJ SOLN
4.0000 mg | Freq: Four times a day (QID) | INTRAMUSCULAR | Status: DC | PRN
  Administered 2016-12-21: 4 mg via INTRAVENOUS

## 2016-12-20 MED ORDER — FENTANYL CITRATE (PF) 100 MCG/2ML IJ SOLN
50.0000 ug | Freq: Once | INTRAMUSCULAR | Status: AC
  Administered 2016-12-20: 50 ug via INTRAVENOUS
  Filled 2016-12-20: qty 2

## 2016-12-20 MED ORDER — ACETAMINOPHEN 650 MG RE SUPP: 650.0000 mg | Freq: Four times a day (QID) | RECTAL | Status: DC | PRN

## 2016-12-20 MED ORDER — MORPHINE SULFATE (PF) 4 MG/ML IV SOLN: 1.0000 mg | INTRAVENOUS | Status: DC | PRN

## 2016-12-20 MED ORDER — PANTOPRAZOLE SODIUM 40 MG IV SOLR
40.0000 mg | Freq: Every day | INTRAVENOUS | Status: DC
  Filled 2016-12-20: qty 40

## 2016-12-20 MED ORDER — CLINDAMYCIN HCL 150 MG PO CAPS: 300.0000 mg | ORAL_CAPSULE | Freq: Three times a day (TID) | ORAL | 0 refills | Status: DC

## 2016-12-20 MED ORDER — SODIUM CHLORIDE 0.9 % IV BOLUS (SEPSIS)
1000.0000 mL | Freq: Once | INTRAVENOUS | Status: AC
  Administered 2016-12-20: 1000 mL via INTRAVENOUS

## 2016-12-20 MED ORDER — ONDANSETRON 4 MG PO TBDP: 4.0000 mg | ORAL_TABLET | Freq: Four times a day (QID) | ORAL | Status: DC | PRN

## 2016-12-20 MED ORDER — OXYCODONE HCL 5 MG PO TABS
5.0000 mg | ORAL_TABLET | Freq: Four times a day (QID) | ORAL | Status: DC | PRN
Start: 2016-12-20 — End: 2016-12-24
  Administered 2016-12-20 – 2016-12-24 (×12): 10 mg via ORAL
  Filled 2016-12-20 (×12): qty 2

## 2016-12-20 MED ORDER — KCL IN DEXTROSE-NACL 20-5-0.45 MEQ/L-%-% IV SOLN
INTRAVENOUS | Status: DC
  Administered 2016-12-20: 22:00:00 via INTRAVENOUS
  Filled 2016-12-20: qty 1000

## 2016-12-20 MED ORDER — DIPHENHYDRAMINE HCL 50 MG/ML IJ SOLN: 12.5000 mg | Freq: Four times a day (QID) | INTRAMUSCULAR | Status: DC | PRN

## 2016-12-20 MED ORDER — OXYCODONE-ACETAMINOPHEN 5-325 MG PO TABS: 1.0000 | ORAL_TABLET | ORAL | 0 refills | Status: DC | PRN

## 2016-12-20 MED ORDER — PIPERACILLIN-TAZOBACTAM 3.375 G IVPB
3.3750 g | Freq: Three times a day (TID) | INTRAVENOUS | Status: DC
  Administered 2016-12-21: 3.375 g via INTRAVENOUS
  Filled 2016-12-20 (×2): qty 50

## 2016-12-20 MED ORDER — DIPHENHYDRAMINE HCL 12.5 MG/5ML PO ELIX
12.5000 mg | ORAL_SOLUTION | Freq: Four times a day (QID) | ORAL | Status: DC | PRN
Start: 2016-12-20 — End: 2016-12-24
  Administered 2016-12-20 – 2016-12-23 (×2): 12.5 mg via ORAL
  Filled 2016-12-20 (×2): qty 10

## 2016-12-20 MED ORDER — VANCOMYCIN HCL IN DEXTROSE 1-5 GM/200ML-% IV SOLN
1000.0000 mg | Freq: Once | INTRAVENOUS | Status: AC
  Administered 2016-12-20: 1000 mg via INTRAVENOUS
  Filled 2016-12-20: qty 200

## 2016-12-20 MED ORDER — MONTELUKAST SODIUM 10 MG PO TABS
10.0000 mg | ORAL_TABLET | Freq: Every day | ORAL | Status: DC
  Filled 2016-12-20: qty 1

## 2016-12-20 MED ORDER — CLINDAMYCIN PHOSPHATE 900 MG/50ML IV SOLN
900.0000 mg | Freq: Once | INTRAVENOUS | Status: AC
  Administered 2016-12-20: 900 mg via INTRAVENOUS
  Filled 2016-12-20: qty 50

## 2016-12-20 NOTE — ED Provider Notes (Signed)
[facility] EMERGENCY DEPARTMENT Provider Note   CSN: [id number] Arrival date & time: 12/19/16  2133     History   Chief Complaint Chief Complaint  Patient presents with  . Headache  . Wound Infection    HPI Courtney [name] is a 26 y.o. female.  Patient with history of hidradenitis, ADHD presents with multiple complaints. She has a generalized headache that started 2 days ago. She reports gradual onset, light sensitivity. No nausea or vomiting. She also reports painful swelling in right axilla at the site of previous I&D performed in the OR in May of this year (Dr. Grandville Silos). She states the area has drained persistently since then but it is now more painful and the drainage has an odor. No fever. She has not seen Dr. Grandville Silos since the one post-surgical follow up in office. She complains of chest tightness on right side of chest since arrival into the ED. No breathing difficulty. No recent cough.    The history is provided by the patient. No language interpreter was used.  Headache   Pertinent negatives include no fever, no nausea and no vomiting.    Past Medical History:  Diagnosis Date  . ADHD (attention deficit hyperactivity disorder)   . Chronic tonsillitis 08/2013   current strep, will finish antibiotic 09/04/2013; snores during sleep, mother denies apnea    Patient Active Problem List   Diagnosis Date Noted  . Well woman exam 12/06/2016  . Vitamin D deficiency 10/18/2014  . S/P tonsillectomy 09/14/2013  . ADHD (attention deficit hyperactivity disorder) 02/11/2012    Past Surgical History:  Procedure Laterality Date  . INCISION AND DRAINAGE ABSCESS Right 07/20/2016   Procedure: INCISION AND DRAINAGE ABSCESS right axilla;  Surgeon: Coralie Keens, MD;  Location: [place];  Service: General;  Laterality: Right;  . TONSILLECTOMY Bilateral 09/14/2013   Procedure: BILATERAL TONSILLECTOMY;  Surgeon: Izora Gala, MD;  Location: [place];   Service: ENT;  Laterality: Bilateral;  . TYMPANOPLASTY Bilateral   . WISDOM TOOTH EXTRACTION      OB History    No data available       Home Medications    Prior to Admission medications   Medication Sig Start Date End Date Taking? Authorizing Provider  acetaminophen (TYLENOL) 500 MG tablet 1000 mg every 8 hours till you do not need pain medicine to move your arm around and use it.  You can buy this over the counter without a prescription. 07/24/16   Earnstine Regal, PA-C  Adalimumab 40 MG/0.8ML PNKT Humira Pen Crohn's-Ulc Colitis-Hid Sup Starter 40 mg/0.8 mL subcut kit    [provider]  EPINEPHrine 0.3 mg/0.3 mL IJ SOAJ injection Inject 0.3 mg into the muscle as needed (for allergic reaction).    [provider]  etonogestrel (NEXPLANON) 68 MG IMPL implant 1 each by Subdermal route once.    [provider]  Levonorgestrel (KYLEENA) 19.5 MG IUD Kyleena 17.5 mcg/24 hour (5 years) intrauterine device  Take 1 device by intrauterine route.    [provider]  methylphenidate Tonita Phoenix) 20 MG/9HR Place 2 patches onto the skin daily. wear patch for 9 hours only each day 11/28/16   Lucille Passy, MD  montelukast (SINGULAIR) 10 MG tablet TAKE 1 TABLET AT BEDTIME 02/06/16   Lucille Passy, MD  PRESCRIPTION MEDICATION Inject into the muscle every 7 (seven) days. Allergy shot    [provider]  saccharomyces boulardii (FLORASTOR) 250 MG capsule You can buy this at  any drug store over the counter.  Take it till you complete on package.  Follow package instructions. 07/24/16   Earnstine Regal, PA-C    Family History Family History  Problem Relation Age of Onset  . Hypertension Mother   . Hyperlipidemia Mother     Social History Social History  Substance Use Topics  . Smoking status: Never Smoker  . Smokeless tobacco: Never Used  . Alcohol use Yes     Comment: occasionally     Allergies   Iodine and Shellfish allergy   Review of  Systems Review of Systems  Constitutional: Negative for chills and fever.  HENT: Negative.   Respiratory: Positive for chest tightness.   Cardiovascular: Negative.   Gastrointestinal: Negative.  Negative for nausea and vomiting.  Skin: Positive for color change and wound.  Neurological: Positive for headaches. Negative for syncope and weakness.     Physical Exam Updated Vital Signs BP 125/76 (BP Location: Right Arm)   Pulse (!) 106   Temp 98.4 F (36.9 C) (Oral)   Resp 18   LMP  (LMP Unknown)   SpO2 100%   Physical Exam  Constitutional: She is oriented to person, place, and time. She appears well-developed and well-nourished.  HENT:  Head: Normocephalic.  Neck: Normal range of motion. Neck supple.  Cardiovascular: Normal rate and regular rhythm.   Pulmonary/Chest: Effort normal and breath sounds normal. She has no wheezes. She has no rales. She exhibits no tenderness.  Abdominal: Soft. Bowel sounds are normal. There is no tenderness. There is no rebound and no guarding.  Musculoskeletal: Normal range of motion.  Right axilla is significantly erythematous. There are multiple points of purulent drainage and generalized induration. Limited ROM of right UE secondary to axillary pain.   Neurological: She is alert and oriented to person, place, and time.  Skin: Skin is warm and dry. No rash noted.  Psychiatric: She has a normal mood and affect.     ED Treatments / Results  Labs (all labs ordered are listed, but only abnormal results are displayed) Labs Reviewed  COMPREHENSIVE METABOLIC PANEL - Abnormal; Notable for the following:       Result Value   Potassium 3.4 (*)    Glucose, Bld 118 (*)    BUN 5 (*)    ALT 13 (*)    All other components within normal limits  CBC WITH DIFFERENTIAL/PLATELET - Abnormal; Notable for the following:    WBC 17.0 (*)    Neutro Abs 13.1 (*)    All other components within normal limits  CULTURE, BLOOD (ROUTINE X 2)  CULTURE, BLOOD (ROUTINE  X 2)  PROTIME-INR  I-STAT CG4 LACTIC ACID, ED  POC URINE PREG, ED  I-STAT CG4 LACTIC ACID, ED  I-STAT TROPONIN, ED    EKG  EKG Interpretation None       Radiology No results found.  Procedures Procedures (including critical care time)  Medications Ordered in ED Medications  sodium chloride 0.9 % bolus 1,000 mL (not administered)  clindamycin (CLEOCIN) IVPB 900 mg (not administered)  fentaNYL (SUBLIMAZE) injection 50 mcg (not administered)     Initial Impression / Assessment and Plan / ED Course  I have reviewed the triage vital signs and the nursing notes.  Pertinent labs & imaging results that were available during my care of the patient were reviewed by me and considered in my medical decision making (see chart for details).     Patient with a history of hidradenitis with previous resection  of right axilla by surgery presents with recurrent symptoms of abscess and infection in right axilla. She is taking Humira for hidradenitis and reports she has fewer symptoms except for this location. No fever. She is also having chest tightness (after arrival to ED) and headache.   Neurologic exam is without deficit. Headache like presvious symptoms. Better with Fentanyl.   She is examined by Dr. [name] Gentles. Do not feel she is septic. IV Clindamycin given in ED. Plan to discharge with close follow up with surgery. In-system email sent to Dr. Grandville Silos to facilitate follow up, however, strict return precautions provided to the patient.   Patient and mother comfortable with discharge home.   Final Clinical Impressions(s) / ED Diagnoses   Final diagnoses:  None   1. Abscess right axilla 2. Cellulitis right axilla 3. nonintractable headache  New Prescriptions New Prescriptions   No medications on file     Charlann Lange, Hershal Coria 12/22/16 2409    Ripley Fraise, MD 12/22/16 2337

## 2016-12-20 NOTE — H&P (Signed)
Courtney [name] is an 26 y.o. female.   Chief Complaint: Abscess HPI: 26 year old obese Caucasian female with a history of axillary hidradenitis status post incision and drainage of right axillary abscess May 2018 comes back in with a several day history of swelling, tenderness and drainage in her right axilla.  She denies any fevers or chills.  She states it was not as large as it was in May.  She states that her abscess in May was size of a grapefruit.   She was seen in the emergency room last night to have a white blood cell count 17,000.  She was given an antibiotic and discharge.  She contacted our office today and we arranged for her to be directly admitted positive tobacco usage  Past Medical History:  Diagnosis Date  . ADHD (attention deficit hyperactivity disorder)   . Chronic tonsillitis 08/2013   current strep, will finish antibiotic 09/04/2013; snores during sleep, mother denies apnea  . Heart murmur    "when I was born; outgrew it; I was a preemie"  . Migraine 12/19/2016   "I've just had this one for 3 days" (12/20/2016)    Past Surgical History:  Procedure Laterality Date  . INCISION AND DRAINAGE ABSCESS Right 07/20/2016   Procedure: INCISION AND DRAINAGE ABSCESS right axilla;  Surgeon: Coralie Keens, MD;  Location: [place];  Service: General;  Laterality: Right;  . INTRAUTERINE DEVICE (IUD) INSERTION  11/2016   "had the one in my left arm removed"  . TONSILLECTOMY Bilateral 09/14/2013   Procedure: BILATERAL TONSILLECTOMY;  Surgeon: Izora Gala, MD;  Location: [place];  Service: ENT;  Laterality: Bilateral;  . TONSILLECTOMY    . TYMPANOPLASTY Bilateral    "rebuilt eardrums"  . WISDOM TOOTH EXTRACTION      Family History  Problem Relation Age of Onset  . Hypertension Mother   . Hyperlipidemia Mother    Social History:  reports that she has never smoked. She has never used smokeless tobacco. She reports that she drinks about 1.2 oz of alcohol per week .  She reports that she does not use drugs.  Allergies:  Allergies  Allergen Reactions  . Iodine Nausea Only  . Shellfish Allergy Anaphylaxis    Medications Prior to Admission  Medication Sig Dispense Refill  . acetaminophen (TYLENOL) 500 MG tablet 1000 mg every 8 hours till you do not need pain medicine to move your arm around and use it.  You can buy this over the counter without a prescription. 30 tablet 0  . Adalimumab (HUMIRA) 40 MG/0.8ML PSKT Inject 40 mg into the skin every 7 (seven) days. On Thursday    . clindamycin (CLEOCIN) 150 MG capsule Take 2 capsules (300 mg total) by mouth 3 (three) times daily. 60 capsule 0  . EPINEPHrine 0.3 mg/0.3 mL IJ SOAJ injection Inject 0.3 mg into the muscle as needed (for allergic reaction).    . Levonorgestrel (KYLEENA) 19.5 MG IUD Kyleena 17.5 mcg/24 hour (5 years) intrauterine device  Take 1 device by intrauterine route.    . methylphenidate (DAYTRANA) 20 MG/9HR Place 2 patches onto the skin daily. wear patch for 9 hours only each day 60 patch 0  . montelukast (SINGULAIR) 10 MG tablet TAKE 1 TABLET AT BEDTIME 90 tablet 2  . oxyCODONE-acetaminophen (PERCOCET/ROXICET) 5-325 MG tablet Take 1-2 tablets by mouth every 4 (four) hours as needed for severe pain. 15 tablet 0  . PRESCRIPTION MEDICATION Inject into the muscle every 7 (seven) days. Allergy shot    .  saccharomyces boulardii (FLORASTOR) 250 MG capsule You can buy this at any drug store over the counter.  Take it till you complete on package.  Follow package instructions. (Patient not taking: Reported on 12/20/2016)      Results for orders placed or performed during the hospital encounter of 12/20/16 (from the past 48 hour(s))  Basic metabolic panel     Status: Abnormal   Collection Time: 12/20/16  9:46 PM  Result Value Ref Range   Sodium 138 135 - 145 mmol/L   Potassium 3.9 3.5 - 5.1 mmol/L   Chloride 104 101 - 111 mmol/L   CO2 26 22 - 32 mmol/L   Glucose, Bld 104 (H) 65 - 99 mg/dL   BUN  <5 (L) 6 - 20 mg/dL   Creatinine, Ser 0.62 0.44 - 1.00 mg/dL   Calcium 8.9 8.9 - 10.3 mg/dL   GFR calc non Af Amer >60 >60 mL/min   GFR calc Af Amer >60 >60 mL/min    Comment: (NOTE) The eGFR has been calculated using the CKD EPI equation. This calculation has not been validated in all clinical situations. eGFR's persistently <60 mL/min signify possible Chronic Kidney Disease.    Anion gap 8 5 - 15  CBC WITH DIFFERENTIAL     Status: Abnormal   Collection Time: 12/20/16  9:46 PM  Result Value Ref Range   WBC 17.1 (H) 4.0 - 10.5 K/uL   RBC 4.33 3.87 - 5.11 MIL/uL   Hemoglobin 12.7 12.0 - 15.0 g/dL   HCT 39.3 36.0 - 46.0 %   MCV 90.8 78.0 - 100.0 fL   MCH 29.3 26.0 - 34.0 pg   MCHC 32.3 30.0 - 36.0 g/dL   RDW 14.0 11.5 - 15.5 %   Platelets 304 150 - 400 K/uL   Neutrophils Relative % 67 %   Neutro Abs 11.5 (H) 1.7 - 7.7 K/uL   Lymphocytes Relative 23 %   Lymphs Abs 3.9 0.7 - 4.0 K/uL   Monocytes Relative 9 %   Monocytes Absolute 1.5 (H) 0.1 - 1.0 K/uL   Eosinophils Relative 1 %   Eosinophils Absolute 0.2 0.0 - 0.7 K/uL   Basophils Relative 0 %   Basophils Absolute 0.0 0.0 - 0.1 K/uL   No results found.  Review of Systems  Constitutional: Negative for chills, fever and weight loss.  HENT: Negative for nosebleeds.   Eyes: Negative for blurred vision.  Respiratory: Negative for shortness of breath.   Cardiovascular: Negative for chest pain, palpitations, orthopnea and PND.       Denies DOE  Genitourinary: Negative for dysuria and hematuria.  Musculoskeletal: Negative.   Skin: Positive for itching. Negative for rash.       Right axillary pain  Neurological: Negative for dizziness, focal weakness, seizures, loss of consciousness and headaches.       Denies TIAs, amaurosis fugax  Endo/Heme/Allergies: Does not bruise/bleed easily.  Psychiatric/Behavioral: The patient is not nervous/anxious.     Blood pressure 116/60, pulse (!) 105, temperature 97.8 F (36.6 C), temperature  source Oral, resp. rate 20, height '5\' 1"'  (1.549 m), weight 75.7 kg (166 lb 14.2 oz), SpO2 99 %. Physical Exam  Vitals reviewed. Constitutional: She is oriented to person, place, and time. She appears well-developed and well-nourished. No distress.  HENT:  Head: Normocephalic and atraumatic.  Right Ear: External ear normal.  Left Ear: External ear normal.  Eyes: Conjunctivae are normal. No scleral icterus.  Neck: Normal range of motion. Neck supple. No tracheal  deviation present. No thyromegaly present.  Cardiovascular: Normal rate and normal heart sounds.   Respiratory: Effort normal and breath sounds normal. No stridor. No respiratory distress. She has no wheezes.    Has swelling/TTP/fluctuance right axilla, old scar, actively draining purulent fluid; upper chest/lower neck/face - flushed, blanching erythema, scattered old skin lesions  GI: Soft. She exhibits no distension. There is no tenderness. There is no rebound.  Musculoskeletal: She exhibits no edema or tenderness.  Lymphadenopathy:    She has no cervical adenopathy.  Neurological: She is alert and oriented to person, place, and time. She exhibits normal muscle tone.  Skin: Skin is warm and dry. Rash noted. She is not diaphoretic. There is erythema. No pallor.  Psychiatric: She has a normal mood and affect. Her behavior is normal. Judgment and thought content normal.     Assessment/Plan Right axillary hydradenitis with abscess Obesity  She appears to be flushed in her face and neck.  She states that that was there earlier this evening prior to antibiotic initiation.  We will monitor  Continue IV antibiotics N.p.o. after midnight Need surgical incision and drainage. We will plan on surgery in the morning with Dr. Dema Severin scds  Gayland Curry, MD 12/20/2016, 11:26 PM

## 2016-12-20 NOTE — Progress Notes (Signed)
Pharmacy Antibiotic Note Courtney ApleyBrandy L [name] is a 26 y.o. female admitted on 12/20/2016 with r axilla cellulitis/abscess. Pharmacy has been consulted for Zosyn and vancomycin dosing.  Plan: 1. Vancomycin 1000 IV every 12 hours.  Goal trough 15-20 mcg/mL. 2. Zosyn 3.375g IV q8h (4 hour infusion).   Temp (24hrs), Avg:98.4 F (36.9 C), Min:98.4 F (36.9 C), Max:98.4 F (36.9 C)   Recent Labs Lab 12/19/16 2155 12/19/16 2216 12/20/16 0029  WBC 17.0*  --   --   CREATININE 0.67  --   --   LATICACIDVEN  --  1.84 1.54    CrCl cannot be calculated (Unknown ideal weight.).    Allergies  Allergen Reactions  . Iodine Nausea Only  . Shellfish Allergy Anaphylaxis    Antimicrobials this admission: 10/18 Zosyn >>  10/18 vancomycin >>   Microbiology results: 10/18 BCx: px  Thank you for allowing pharmacy to be a part of this patient's care.  Pollyann SamplesAndy Braeleigh Pyper, PharmD, BCPS 12/20/2016, 9:20 PM

## 2016-12-20 NOTE — Discharge Instructions (Signed)
Call Dr. Janee Mornhompson for close follow up appointment for further management of recurrent axillary abscess/infection. Take Clindamycin as directed for infection, and Percocet for pain. Return to the emergency department with any high fever, uncontrolled pain or new concern.

## 2016-12-20 NOTE — ED Provider Notes (Signed)
Patient gave verbal permission to utilize photo for medical documentation only The image was not stored on any personal device    Pt with worsening pain/redness/drainage from right axilla H/o hidradenitis Start IV antibiotics and IV pain meds    Zadie RhineWickline, Audrena Talaga, MD 12/20/16 [phone]

## 2016-12-20 NOTE — ED Notes (Signed)
Pt reports coming in with a headache and wanted to have a surgical wound checked.

## 2016-12-20 NOTE — Progress Notes (Signed)
Received patient from ED direct admit from home accompanied by mother under CCS. Patient AOx4, ambulatory, VS stable tachy PR at 105, pain 2/10 and O2Sat at 99% on RA.  Initiated pre-op procedure per order. Will monitor.

## 2016-12-20 NOTE — ED Notes (Signed)
Pt now reports CP.  EKG obtained and will stick for troponin.

## 2016-12-21 ENCOUNTER — Inpatient Hospital Stay (HOSPITAL_COMMUNITY): Payer: 59 | Admitting: Certified Registered Nurse Anesthetist

## 2016-12-21 ENCOUNTER — Encounter (HOSPITAL_COMMUNITY): Payer: Self-pay | Admitting: Certified Registered Nurse Anesthetist

## 2016-12-21 ENCOUNTER — Encounter (HOSPITAL_COMMUNITY): Admission: AD | Disposition: A | Discharge status: Home Health | Payer: Self-pay | Source: Ambulatory Visit

## 2016-12-21 DIAGNOSIS — L732 Hidradenitis suppurativa: Secondary | ICD-10-CM | POA: Diagnosis not present

## 2016-12-21 DIAGNOSIS — E559 Vitamin D deficiency, unspecified: Secondary | ICD-10-CM | POA: Diagnosis not present

## 2016-12-21 DIAGNOSIS — L02411 Cutaneous abscess of right axilla: Secondary | ICD-10-CM | POA: Diagnosis not present

## 2016-12-21 HISTORY — PX: IRRIGATION AND DEBRIDEMENT ABSCESS: SHX5252

## 2016-12-21 LAB — SURGICAL PCR SCREEN
MRSA, PCR: NEGATIVE
STAPHYLOCOCCUS AUREUS: NEGATIVE

## 2016-12-21 SURGERY — IRRIGATION AND DEBRIDEMENT ABSCESS
Anesthesia: General | Site: Axilla | Laterality: Right

## 2016-12-21 MED ORDER — DEXAMETHASONE SODIUM PHOSPHATE 10 MG/ML IJ SOLN
INTRAMUSCULAR | Status: AC
  Filled 2016-12-21: qty 2

## 2016-12-21 MED ORDER — EPHEDRINE 5 MG/ML INJ
INTRAVENOUS | Status: AC
  Filled 2016-12-21: qty 10

## 2016-12-21 MED ORDER — PROPOFOL 10 MG/ML IV BOLUS
INTRAVENOUS | Status: AC
  Filled 2016-12-21: qty 20

## 2016-12-21 MED ORDER — ADULT MULTIVITAMIN W/MINERALS CH
1.0000 | ORAL_TABLET | Freq: Every day | ORAL | Status: DC
  Administered 2016-12-21 – 2016-12-23 (×3): 1 via ORAL
  Filled 2016-12-21 (×4): qty 1

## 2016-12-21 MED ORDER — OXYCODONE HCL 5 MG PO TABS: 5.0000 mg | ORAL_TABLET | Freq: Once | ORAL | Status: DC | PRN

## 2016-12-21 MED ORDER — PROPOFOL 10 MG/ML IV BOLUS
INTRAVENOUS | Status: DC | PRN
  Administered 2016-12-21: 200 mg via INTRAVENOUS

## 2016-12-21 MED ORDER — FENTANYL CITRATE (PF) 100 MCG/2ML IJ SOLN
INTRAMUSCULAR | Status: DC | PRN
  Administered 2016-12-21: 25 ug via INTRAVENOUS
  Administered 2016-12-21: 50 ug via INTRAVENOUS

## 2016-12-21 MED ORDER — MIDAZOLAM HCL 5 MG/5ML IJ SOLN
INTRAMUSCULAR | Status: DC | PRN
  Administered 2016-12-21: 2 mg via INTRAVENOUS

## 2016-12-21 MED ORDER — OXYCODONE HCL 5 MG PO TABS
ORAL_TABLET | ORAL | Status: AC
  Filled 2016-12-21: qty 1

## 2016-12-21 MED ORDER — ONDANSETRON HCL 4 MG/2ML IJ SOLN
INTRAMUSCULAR | Status: AC
  Filled 2016-12-21: qty 4

## 2016-12-21 MED ORDER — 0.9 % SODIUM CHLORIDE (POUR BTL) OPTIME
TOPICAL | Status: DC | PRN
  Administered 2016-12-21: 1000 mL

## 2016-12-21 MED ORDER — PHENYLEPHRINE 40 MCG/ML (10ML) SYRINGE FOR IV PUSH (FOR BLOOD PRESSURE SUPPORT)
PREFILLED_SYRINGE | INTRAVENOUS | Status: AC
  Filled 2016-12-21: qty 10

## 2016-12-21 MED ORDER — PHENYLEPHRINE HCL 10 MG/ML IJ SOLN
INTRAMUSCULAR | Status: DC | PRN
  Administered 2016-12-21: 80 ug via INTRAVENOUS

## 2016-12-21 MED ORDER — LACTATED RINGERS IV SOLN
INTRAVENOUS | Status: DC
  Administered 2016-12-21 – 2016-12-23 (×3): via INTRAVENOUS

## 2016-12-21 MED ORDER — MIDAZOLAM HCL 2 MG/2ML IJ SOLN
INTRAMUSCULAR | Status: AC
  Filled 2016-12-21: qty 2

## 2016-12-21 MED ORDER — DEXAMETHASONE SODIUM PHOSPHATE 4 MG/ML IJ SOLN
INTRAMUSCULAR | Status: DC | PRN
  Administered 2016-12-21: 8 mg via INTRAVENOUS

## 2016-12-21 MED ORDER — LIDOCAINE HCL (CARDIAC) 20 MG/ML IV SOLN
INTRAVENOUS | Status: DC | PRN
  Administered 2016-12-21: 60 mg via INTRAVENOUS

## 2016-12-21 MED ORDER — BUPIVACAINE-EPINEPHRINE (PF) 0.25% -1:200000 IJ SOLN
INTRAMUSCULAR | Status: AC
  Filled 2016-12-21: qty 30

## 2016-12-21 MED ORDER — LIDOCAINE 2% (20 MG/ML) 5 ML SYRINGE
INTRAMUSCULAR | Status: AC
  Filled 2016-12-21: qty 10

## 2016-12-21 MED ORDER — FENTANYL CITRATE (PF) 250 MCG/5ML IJ SOLN
INTRAMUSCULAR | Status: AC
  Filled 2016-12-21: qty 5

## 2016-12-21 MED ORDER — OXYCODONE HCL 5 MG/5ML PO SOLN: 5.0000 mg | Freq: Once | ORAL | Status: DC | PRN

## 2016-12-21 MED ORDER — BUPIVACAINE-EPINEPHRINE 0.25% -1:200000 IJ SOLN
INTRAMUSCULAR | Status: DC | PRN
  Administered 2016-12-21: 30 mL

## 2016-12-21 SURGICAL SUPPLY — 33 items
BNDG GAUZE ELAST 4 BULKY (GAUZE/BANDAGES/DRESSINGS) ×3 IMPLANT
CANISTER SUCT 3000ML PPV (MISCELLANEOUS) ×3 IMPLANT
COVER SURGICAL LIGHT HANDLE (MISCELLANEOUS) ×3 IMPLANT
DRAPE LAPAROSCOPIC ABDOMINAL (DRAPES) IMPLANT
DRAPE LAPAROTOMY T 98X78 PEDS (DRAPES) ×3 IMPLANT
DRAPE UTILITY XL STRL (DRAPES) IMPLANT
DRSG PAD ABDOMINAL 8X10 ST (GAUZE/BANDAGES/DRESSINGS) ×3 IMPLANT
ELECT CAUTERY BLADE 6.4 (BLADE) ×3 IMPLANT
ELECT REM PT RETURN 9FT ADLT (ELECTROSURGICAL) ×3
ELECTRODE REM PT RTRN 9FT ADLT (ELECTROSURGICAL) ×1 IMPLANT
GAUZE SPONGE 4X4 12PLY STRL (GAUZE/BANDAGES/DRESSINGS) ×3 IMPLANT
GLOVE BIO SURGEON STRL SZ7 (GLOVE) ×3 IMPLANT
GLOVE BIO SURGEON STRL SZ7.5 (GLOVE) ×3 IMPLANT
GLOVE BIOGEL PI IND STRL 8 (GLOVE) ×1 IMPLANT
GLOVE BIOGEL PI IND STRL 8.5 (GLOVE) ×1 IMPLANT
GLOVE BIOGEL PI INDICATOR 8 (GLOVE) ×2
GLOVE BIOGEL PI INDICATOR 8.5 (GLOVE) ×2
GLOVE ECLIPSE 8.0 STRL XLNG CF (GLOVE) ×3 IMPLANT
GOWN STRL REUS W/ TWL LRG LVL3 (GOWN DISPOSABLE) IMPLANT
GOWN STRL REUS W/ TWL XL LVL3 (GOWN DISPOSABLE) ×2 IMPLANT
GOWN STRL REUS W/TWL LRG LVL3 (GOWN DISPOSABLE)
GOWN STRL REUS W/TWL XL LVL3 (GOWN DISPOSABLE) ×4
KIT BASIN OR (CUSTOM PROCEDURE TRAY) ×3 IMPLANT
KIT ROOM TURNOVER OR (KITS) ×3 IMPLANT
NEEDLE HYPO 25GX1X1/2 BEV (NEEDLE) ×3 IMPLANT
NS IRRIG 1000ML POUR BTL (IV SOLUTION) ×3 IMPLANT
PACK GENERAL/GYN (CUSTOM PROCEDURE TRAY) ×3 IMPLANT
PAD ARMBOARD 7.5X6 YLW CONV (MISCELLANEOUS) ×3 IMPLANT
SWAB COLLECTION DEVICE MRSA (MISCELLANEOUS) IMPLANT
SWAB CULTURE ESWAB REG 1ML (MISCELLANEOUS) IMPLANT
SYR CONTROL 10ML LL (SYRINGE) ×3 IMPLANT
TOWEL OR 17X24 6PK STRL BLUE (TOWEL DISPOSABLE) ×3 IMPLANT
TOWEL OR 17X26 10 PK STRL BLUE (TOWEL DISPOSABLE) IMPLANT

## 2016-12-21 NOTE — Progress Notes (Signed)
Subjective No acute events. Right axilla draining purulent fluid. No other complaints. Left axilla with small opening/drainage but not bothersome.  Objective: Vital signs in last 24 hours: Temp:  [97.8 F (36.6 C)-97.9 F (36.6 C)] 97.9 F (36.6 C) (10/19 0604) Pulse Rate:  [77-105] 77 (10/19 0604) Resp:  [18-20] 18 (10/19 0604) BP: (100-116)/(54-60) 100/54 (10/19 0604) SpO2:  [98 %-99 %] 98 % (10/19 0604) Weight:  [75.7 kg (166 lb 14.2 oz)] 75.7 kg (166 lb 14.2 oz) (10/18 2025) Last BM Date: 12/20/16  Intake/Output from previous day: 10/18 0701 - 10/19 0700 In: 956.7 [P.O.:300; I.V.:406.7; IV Piggyback:250] Out: 600 [Urine:600] Intake/Output this shift: No intake/output data recorded.  Gen: NAD, comfortable CV: RRR Pulm: Normal work of breathing Skin: Left axilla erythematous, draining pus, areas of fluctuance; R axilla with 1x2cm area of induration, no fluctuance, draining Ext: SCDs in place  Lab Results: CBC   Recent Labs  12/19/16 2155 12/20/16 2146  WBC 17.0* 17.1*  HGB 13.1 12.7  HCT 39.8 39.3  PLT 349 304   BMET  Recent Labs  12/19/16 2155 12/20/16 2146  NA 138 138  K 3.4* 3.9  CL 103 104  CO2 26 26  GLUCOSE 118* 104*  BUN 5* <5*  CREATININE 0.67 0.62  CALCIUM 9.4 8.9   PT/INR  Recent Labs  12/19/16 2155  LABPROT 13.8  INR 1.07   ABG No results for input(s): PHART, HCO3 in the last 72 hours.  Invalid input(s): PCO2, PO2  Studies/Results:  Anti-infectives: Anti-infectives    Start     Dose/Rate Route Frequency Ordered Stop   12/21/16 1000  vancomycin (VANCOCIN) IVPB 1000 mg/200 mL premix     1,000 mg 200 mL/hr over 60 Minutes Intravenous Every 12 hours 12/20/16 2118     12/21/16 0600  piperacillin-tazobactam (ZOSYN) IVPB 3.375 g     3.375 g 12.5 mL/hr over 240 Minutes Intravenous Every 8 hours 12/20/16 2118     12/20/16 2115  piperacillin-tazobactam (ZOSYN) IVPB 3.375 g     3.375 g 100 mL/hr over 30 Minutes Intravenous  Once  12/20/16 2109 12/20/16 2225   12/20/16 2115  vancomycin (VANCOCIN) IVPB 1000 mg/200 mL premix     1,000 mg 200 mL/hr over 60 Minutes Intravenous  Once 12/20/16 2109 12/20/16 2314       Assessment/Plan: Patient Active Problem List   Diagnosis Date Noted  . Axillary abscess 12/20/2016  . Well woman exam 12/06/2016  . Vitamin D deficiency 10/18/2014  . S/P tonsillectomy 09/14/2013  . ADHD (attention deficit hyperactivity disorder) 02/11/2012   OR today for incision & drainage of right axillary abscess/abscesses -NPO, IVF, IV abx -The procedure, material risks (including but not limited to pain, bleeding, need for transfusion, recurrence of infection, scarring, need for additional procedures, nerve damage, need for skin grafts), benefits and alternatives were described. She is well aware of the pathophysiology of hidradenitis as she has been dealing with this for some time. She expressed understanding. Her questions were answered to her satisfaction and she elected to proceed. -We discussed that this was not curative in intent, meaning she would still have hidradenitis following but this was to help control the infection   LOS: 1 day   Stephanie Couphristopher M. Cliffton AstersWhite, M.D. General and Colorectal Surgery [facility]

## 2016-12-21 NOTE — Transfer of Care (Signed)
Immediate Anesthesia Transfer of Care Note  Patient: Courtney [name]  Procedure(s) Performed: IRRIGATION AND DEBRIDEMENT RIGHT AXILLARY HIDRADENITIS (Right Axilla)  Patient Location: PACU  Anesthesia Type:General  Level of Consciousness: awake, alert , oriented and patient cooperative  Airway & Oxygen Therapy: Patient Spontanous Breathing and Patient connected to nasal cannula oxygen  Post-op Assessment: Report given to RN and Post -op Vital signs reviewed and stable  Post vital signs: Reviewed and stable  Last Vitals:  Vitals:   12/21/16 0604 12/21/16 0825  BP: (!) 100/54 (!) 101/57  Pulse: 77 85  Resp: 18 18  Temp: 36.6 C 36.8 C  SpO2: 98% 100%    Last Pain:  Vitals:   12/21/16 0825  TempSrc: Oral  PainSc:          Complications: No apparent anesthesia complications

## 2016-12-21 NOTE — Progress Notes (Signed)
Initial Nutrition Assessment  DOCUMENTATION CODES:   Obesity unspecified  INTERVENTION:   -MVI daily  NUTRITION DIAGNOSIS:   Increased nutrient needs related to wound healing as evidenced by estimated needs.  GOAL:   Patient will meet greater than or equal to 90% of their needs  MONITOR:   PO intake, Supplement acceptance, Labs, Weight trends, Skin, I & O's  REASON FOR ASSESSMENT:   Malnutrition Screening Tool    ASSESSMENT:   [age] year old obese Caucasian female with a history of axillary hidradenitis status post incision and drainage of right axillary abscess May 2018 comes back in with a several day history of swelling, tenderness and drainage in her right axilla.   10/19- s/p Incision and drainage of right axillary hidradenitis/abscesses  Pt sleeping soundly at time of visit. Pt dd not arouse to participate in hx. Lunch tray at bedside untouched.   Reviewed wt hx; pt experienced a 3.4% wt loss over the past 3 months, which is not significant for time frame.   Nutrition-Focused physical exam completed. Findings are no fat depletion, no muscle depletion, and no edema.   Labs reviewed.   Diet Order:  Diet regular Room service appropriate? Yes; Fluid consistency: Thin  Skin:   (closed rt axilla incision)  Last BM:  12/20/16  Height:   Ht Readings from Last 1 Encounters:  12/21/16 5' 0.98" (1.549 m)    Weight:   Wt Readings from Last 1 Encounters:  12/21/16 166 lb 14.2 oz (75.7 kg)    Ideal Body Weight:  47.7 kg  BMI:  Body mass index is 31.55 kg/m.  Estimated Nutritional Needs:   Kcal:  1650-1850  Protein:  95-110 grams  Fluid:  >1.6 L  EDUCATION NEEDS:   Education needs no appropriate at this time  Ray Glacken A. Mayford KnifeWilliams, RD, LDN, CDE Pager: [phone] After hours Pager: [phone]

## 2016-12-21 NOTE — Anesthesia Procedure Notes (Signed)
Procedure Name: LMA Insertion Date/Time: 12/21/2016 10:02 AM Performed by: Kelly SplinterHAWKINS, Nathon Stefanski B Pre-anesthesia Checklist: Patient identified, Emergency Drugs available, Suction available and Patient being monitored Patient Re-evaluated:Patient Re-evaluated prior to induction Oxygen Delivery Method: Circle System Utilized Preoxygenation: Pre-oxygenation with 100% oxygen Induction Type: IV induction Ventilation: Mask ventilation without difficulty LMA: LMA inserted LMA Size: 4.0 Number of attempts: 1 Placement Confirmation: positive ETCO2 Tube secured with: Tape Dental Injury: Teeth and Oropharynx as per pre-operative assessment

## 2016-12-21 NOTE — Anesthesia Preprocedure Evaluation (Signed)
Anesthesia Evaluation  Patient identified by MRN, date of birth, ID band Patient awake    Reviewed: Allergy & Precautions, H&P , NPO status , Patient's Chart, lab work & pertinent test results  Airway Mallampati: II  TM Distance: >3 FB Neck ROM: Full    Dental no notable dental hx. (+) Teeth Intact, Dental Advisory Given   Pulmonary neg pulmonary ROS,    Pulmonary exam normal breath sounds clear to auscultation       Cardiovascular negative cardio ROS   Rhythm:Regular Rate:Normal     Neuro/Psych  Headaches, negative neurological ROS  negative psych ROS   GI/Hepatic negative GI ROS, Neg liver ROS,   Endo/Other  negative endocrine ROS  Renal/GU negative Renal ROS  negative genitourinary   Musculoskeletal negative musculoskeletal ROS (+)   Abdominal (+) + obese,   Peds negative pediatric ROS (+) ADHD Hematology negative hematology ROS (+)   Anesthesia Other Findings   Reproductive/Obstetrics negative OB ROS                             Anesthesia Physical  Anesthesia Plan  ASA: II  Anesthesia Plan: General   Post-op Pain Management:    Induction: Intravenous  PONV Risk Score and Plan: 3 and Ondansetron, Dexamethasone and Midazolam  Airway Management Planned: LMA  Additional Equipment:   Intra-op Plan:   Post-operative Plan: Extubation in OR  Informed Consent: I have reviewed the patients History and Physical, chart, labs and discussed the procedure including the risks, benefits and alternatives for the proposed anesthesia with the patient or authorized representative who has indicated his/her understanding and acceptance.   Dental advisory given  Plan Discussed with: CRNA  Anesthesia Plan Comments:         Anesthesia Quick Evaluation

## 2016-12-21 NOTE — Anesthesia Postprocedure Evaluation (Signed)
Anesthesia Post Note  Patient: Courtney [name]  Procedure(s) Performed: IRRIGATION AND DEBRIDEMENT RIGHT AXILLARY HIDRADENITIS (Right Axilla)     Patient location during evaluation: PACU Anesthesia Type: General Level of consciousness: awake and alert Pain management: pain level controlled Vital Signs Assessment: post-procedure vital signs reviewed and stable Respiratory status: spontaneous breathing, nonlabored ventilation and respiratory function stable Cardiovascular status: blood pressure returned to baseline and stable Postop Assessment: no apparent nausea or vomiting Anesthetic complications: no    Last Vitals:  Vitals:   12/21/16 1115 12/21/16 1138  BP: 112/78 98/63  Pulse: 96 81  Resp: 17 18  Temp:  37 C  SpO2: 95% 96%    Last Pain:  Vitals:   12/21/16 1138  TempSrc:   PainSc: Asleep                 Lowella CurbWarren Ray Linzy Laury

## 2016-12-21 NOTE — Op Note (Signed)
12/20/2016 - 12/21/2016  10:46 AM  PATIENT:  Courtney [name]  26 y.o. female  Patient Care Team: Courtney Passy, MD as PCP - General (Family Medicine) Courtney Millers, MD as Attending Physician (Obstetrics and Gynecology)  PRE-OPERATIVE DIAGNOSIS:  RIGHT AXILLARY ABSCESS  POST-OPERATIVE DIAGNOSIS:  RIGHT AXILLARY ABSCESS  PROCEDURE:  Incision and drainage of right axillary hidradenitis/abscesses  SURGEON:  Courtney Mt. Netasha Wehrli, MD  ASSISTANT: None  ANESTHESIA:   MAC  COUNTS:  Sponge, needle and instrument counts were reported correct x2 at the conclusion of the operation.  EBL: 50cc  DRAINS: None  SPECIMEN: None  FINDINGS: Multiple fluctuant areas in the right axilla - all pus containing. All opened and drained. None communicated with one another.  DISPOSITION: PACU in satisfactory condition  INDICATION: 26 year old obese Caucasian female with a history of axillary hidradenitis status post incision and drainage of right axillary abscess May 2018 comes back in with a several day history of swelling, tenderness and drainage in her right axilla.  She denies any fevers or chills.  She states it was not as large as it was in May.  She states that her abscess in May was size of a grapefruit.   She was seen in the emergency room last night to have a Courtney [name] blood cell count 17,000.  She was given an antibiotic and discharge.  She contacted our office today and we arranged for her to be directly admitted positive tobacco usage. She is well aware of the disease of hidradenitis and natural course. She understands she has numerous axillary abscesses that need to be drained. The anatomy and physiology of the axilla was described. The pathophysiology was also detailed. The procedure, material risks (including, but not limited to, pain, bleeding, infection, scarring, damage to surrounding structures, need for additional procedures,  heart attack, stroke, death), benefits and alternatives were  explained. The patient's questions were answered to their satisfaction and they elected to proceed with surgery.  DESCRIPTION: The patient was identified in preop holding and taken to the OR where they were placed on the operating room table and SCDs were placed. Monitored anesthesia care was induced without difficulty using an LMA. The patient was then prepped and draped in the usual sterile fashion. A surgical timeout was performed indicating the correct patient, procedure, positioning and need for preoperative antibiotics.   All areas of fluctuance were identified in the right axilla. There were approximately 6 separate areas - all were incisied and drained. The pockets were tracking in the axilla but were not communicating. Pus was evacuated from all of these. No other areas of fluctuance were identified. Hemostasis was achieved with electrocautery. Extensive amounts of granulation tissue was encountered and debrided and fulgurated. A Kerlex gauze was packed throughout the open cavities in the axilla, covered in 4x4s and an ABD pad and secured with tape. The patient was then awakened, transferred to a stretcher for transport to PACU in satisfactory condition.  Courtney [name], M.D. [facility]  Note: This dictation was prepared with Dragon/digital dictation along with [employer]. Any transcriptional errors that result from this process are unintentional.

## 2016-12-22 ENCOUNTER — Encounter (HOSPITAL_COMMUNITY): Payer: Self-pay | Admitting: Surgery

## 2016-12-22 MED ORDER — ENOXAPARIN SODIUM 40 MG/0.4ML ~~LOC~~ SOLN
40.0000 mg | SUBCUTANEOUS | Status: DC
  Administered 2016-12-22 – 2016-12-24 (×3): 40 mg via SUBCUTANEOUS
  Filled 2016-12-22 (×3): qty 0.4

## 2016-12-22 MED ORDER — PIPERACILLIN-TAZOBACTAM 3.375 G IVPB
3.3750 g | Freq: Three times a day (TID) | INTRAVENOUS | Status: DC
  Administered 2016-12-22 – 2016-12-24 (×6): 3.375 g via INTRAVENOUS
  Filled 2016-12-22 (×8): qty 50

## 2016-12-22 NOTE — Progress Notes (Signed)
Pharmacy Antibiotic Note Courtney [name] is a 26 y.o. female admitted on 12/20/2016 with right axilla cellulitis/abscess s/p I&D on 10/19. Pharmacy has been consulted to resume Zosyn. She is already on vancomycin.   Plan: Resume Zosyn 3.375 g IV q8h to be infused over 4 hours Continue vancomycin 1000 mg IV q12h Lovenox 40 mg SQ q24h  Temp (24hrs), Avg:98.3 F (36.8 C), Min:97.8 F (36.6 C), Max:99.1 F (37.3 C)   Recent Labs Lab 12/19/16 2155 12/19/16 2216 12/20/16 0029 12/20/16 2146  WBC 17.0*  --   --  17.1*  CREATININE 0.67  --   --  0.62  LATICACIDVEN  --  1.84 1.54  --     Estimated Creatinine Clearance: 101 mL/min (by C-G formula based on SCr of 0.62 mg/dL).    Allergies  Allergen Reactions  . Iodine Nausea Only  . Shellfish Allergy Anaphylaxis    Antimicrobials this admission: 10/18 Zosyn >> 10/19, 10/20>> 10/18 vancomycin >>  Microbiology results: 10/18 bcx: ngtd  Thank you for allowing pharmacy to be a part of this patient's care.   Loura BackJennifer Illiopolis, PharmD, BCPS Clinical Pharmacist Phone for today [phone] Main pharmacy - [phone] 12/22/2016 12:34 PM

## 2016-12-22 NOTE — Progress Notes (Signed)
1 Day Post-Op  Subjective: Stable and alert.  Family members in room.   Still has pain No unusual bleeding  Dressing was changed earlier by nurse who noted multiple incisions but clean wound without bleeding or purulence  Cultures are polymicrobial. GPC;GNR;rare GPR    Objective: Vital signs in last 24 hours: Temp:  [97.8 F (36.6 C)-99.1 F (37.3 C)] 99.1 F (37.3 C) (10/20 0645) Pulse Rate:  [68-93] 68 (10/20 0645) Resp:  [17-18] 17 (10/20 0645) BP: (99-109)/(54-85) 101/85 (10/20 0645) SpO2:  [96 %-100 %] 100 % (10/20 0645) Last BM Date: 12/21/16  Intake/Output from previous day: 10/19 0701 - 10/20 0700 In: 847 [P.O.:240; I.V.:207; IV Piggyback:400] Out: 2550 [Urine:2500; Blood:50] Intake/Output this shift: No intake/output data recorded.  General appearance: alert.  Oriented.  Does not appear ill or toxic at all. Skin warm and dry Chest wall: no tenderness, right axillary bandage is clean and dry without odor.  She does not want to move her shoulder around very much.  Lab Results:  No results found for this or any previous visit (from the past 24 hour(s)).   Studies/Results: No results found.  . docusate sodium  100 mg Oral BID  . multivitamin with minerals  1 tablet Oral Daily     Assessment/Plan: s/p Procedure(s): IRRIGATION AND DEBRIDEMENT RIGHT AXILLARY HIDRADENITIS  POD #1.  Stable following incision and drainage of multiple right axillary abscesses  Since cultures are polymicrobial I will add Zosyn to her vancomycin Begin twice a day wound care  Began Lovenox for DVT prophylaxis  @PROBHOSP @  LOS: 1 day    Courtney [name] M 12/22/2016  . .prob

## 2016-12-23 MED ORDER — FLUCONAZOLE 100 MG PO TABS
200.0000 mg | ORAL_TABLET | Freq: Every day | ORAL | Status: DC
  Administered 2016-12-23 – 2016-12-24 (×2): 200 mg via ORAL
  Filled 2016-12-23 (×2): qty 2

## 2016-12-23 NOTE — Progress Notes (Signed)
2 Days Post-Op  Subjective: Stable and alert Dressing changes are painful Comfortable otherwise No bleeding Complains of yeast vaginitis which happens every time she gets antibiotics by history  Remains on vancomycin and Zosyn because of polymicrobial cultures Adding Diflucan because of used vaginitis  Objective: Vital signs in last 24 hours: Temp:  [97.8 F (36.6 C)-98.1 F (36.7 C)] 98.1 F (36.7 C) (10/21 0643) Pulse Rate:  [74-84] 84 (10/21 0643) Resp:  [16] 16 (10/21 0643) BP: (90-96)/(52-56) 90/53 (10/21 0643) SpO2:  [99 %-100 %] 99 % (10/21 0643) Last BM Date: 12/21/16  Intake/Output from previous day: 10/20 0701 - 10/21 0700 In: 1287 [P.O.:600; I.V.:137; IV Piggyback:550] Out: 1600 [Urine:1600] Intake/Output this shift: No intake/output data recorded.  General appearance: Alert.  Cooperative.  Obese. Incision/Wound: Right axillary bandage change.  She has 5 wounds which are open and tender but there is almost no cellulitis.  No odor.  No purulence.  Repacked and redressed  Lab Results:  No results found for this or any previous visit (from the past 24 hour(s)).   Studies/Results: No results found.  . docusate sodium  100 mg Oral BID  . enoxaparin (LOVENOX) injection  40 mg Subcutaneous Q24H  . fluconazole  200 mg Oral Daily  . multivitamin with minerals  1 tablet Oral Daily     Assessment/Plan: s/p Procedure(s): IRRIGATION AND DEBRIDEMENT RIGHT AXILLARY HIDRADENITIS   POD #.  Stable following incision and drainage of multiple right axillary abscesses Since cultures are polymicrobial I have her on vanc/zosyn  twice a day wound care  Yeast vaginitis - diflucan  Began Lovenox for DVT prophylaxis   @PROBHOSP @  LOS: 3 days    Ercell Razon M 12/23/2016  . .prob

## 2016-12-24 LAB — BASIC METABOLIC PANEL
ANION GAP: 9 (ref 5–15)
BUN: 8 mg/dL (ref 6–20)
CHLORIDE: 99 mmol/L — AB (ref 101–111)
CO2: 27 mmol/L (ref 22–32)
Calcium: 8.6 mg/dL — ABNORMAL LOW (ref 8.9–10.3)
Creatinine, Ser: 0.87 mg/dL (ref 0.44–1.00)
GFR calc Af Amer: >60 mL/min (ref >60)
GLUCOSE: 86 mg/dL (ref 65–99)
POTASSIUM: 3.9 mmol/L (ref 3.5–5.1)
Sodium: 135 mmol/L (ref 135–145)

## 2016-12-24 LAB — CBC
HCT: 36.7 % (ref 36.0–46.0)
Hemoglobin: 11.6 g/dL — ABNORMAL LOW (ref 12.0–15.0)
MCH: 28.6 pg (ref 26.0–34.0)
MCHC: 31.6 g/dL (ref 30.0–36.0)
MCV: 90.6 fL (ref 78.0–100.0)
PLATELETS: 332 10*3/uL (ref 150–400)
RBC: 4.05 MIL/uL (ref 3.87–5.11)
RDW: 13.5 % (ref 11.5–15.5)
WBC: 15.3 10*3/uL — AB (ref 4.0–10.5)

## 2016-12-24 LAB — CULTURE, BLOOD (ROUTINE X 2)
CULTURE: NO GROWTH
CULTURE: NO GROWTH
SPECIAL REQUESTS: ADEQUATE
Special Requests: ADEQUATE

## 2016-12-24 MED ORDER — OXYCODONE HCL 5 MG PO TABS: 5.0000 mg | ORAL_TABLET | Freq: Four times a day (QID) | ORAL | 0 refills | Status: DC | PRN

## 2016-12-24 MED ORDER — OXYCODONE HCL 5 MG PO TABS: 5.0000 mg | ORAL_TABLET | Freq: Four times a day (QID) | ORAL | Status: DC | PRN

## 2016-12-24 MED ORDER — ONDANSETRON HCL 4 MG/2ML IJ SOLN
4.0000 mg | Freq: Four times a day (QID) | INTRAMUSCULAR | Status: DC | PRN
  Administered 2016-12-24: 4 mg via INTRAVENOUS
  Filled 2016-12-24: qty 2

## 2016-12-24 MED ORDER — ONDANSETRON 4 MG PO TBDP: 4.0000 mg | ORAL_TABLET | Freq: Three times a day (TID) | ORAL | 0 refills | Status: DC | PRN

## 2016-12-24 MED ORDER — CLINDAMYCIN HCL 150 MG PO CAPS: 300.0000 mg | ORAL_CAPSULE | Freq: Three times a day (TID) | ORAL | 0 refills | Status: DC

## 2016-12-24 NOTE — Progress Notes (Signed)
Patient was laying in her bed but awake. Her Advance directives paperwork was ready awaiting the necessary signatories. Two witnesses and the notary lady arrived and completed as required. Earlier chaplain had shared with patient who talked about family closeness and good support. Chaplain provided reflective listening, compassionate presence and emotional support. AD completed, photocopied, one copy left at nurse's station and another to patient.  Chaplain Emmett Arntz.

## 2016-12-24 NOTE — Progress Notes (Signed)
Right axilla dressing changed by Shanda BumpsJessica PA this morning. Pt's mother will do the dressing changes at home, twice a day. Discharge instructions given to pt. Discharged to home accompanied by mother.

## 2016-12-24 NOTE — Care Management Note (Signed)
Case Management Note  Patient Details  Name: Courtney [name] MRN: [id number] Date of Birth: Sep 14, 1990  Subjective/Objective:                    Action/Plan:   Expected Discharge Date:                  Expected Discharge Plan:  Home w Home Health Services  In-House Referral:     Discharge planning Services  CM Consult  Post Acute Care Choice:  Home Health Choice offered to:  Patient  DME Arranged:    DME Agency:     HH Arranged:  RN HH Agency:  [facility]  Status of Service:  Completed, signed off  If discussed at Long Length of Stay Meetings, dates discussed:    Additional Comments:  Courtney [name], Courtney Mccarrick Marie, RN 12/24/2016, 12:42 PM

## 2016-12-24 NOTE — Progress Notes (Signed)
Pt refused to change her dressing today she wants to change after breakfast, given pain meds as PRN.

## 2016-12-24 NOTE — Discharge Instructions (Signed)
TAKE THE CLINDAMYCIN AS PRESCRIBED BY THE ER. SINCE YOU ALREADY FILLED THAT PRESCRIPTION WE WILL NOT WRITE YOU ANOTHER ONE. TAKE 300MG  (2 TABLETS) EVERY 8 HOURS FOR 7 DAYS.   1. PAIN CONTROL:  1. Pain is best controlled by a usual combination of three different methods TOGETHER:  1. Ice/Heat 2. Over the counter pain medication 3. Prescription pain medication 2. Most patients will experience some swelling and bruising around wounds. Ice packs or heating pads (30-60 minutes up to 6 times a day) will help. Use ice for the first few days to help decrease swelling and bruising, then switch to heat to help relax tight/sore spots and speed recovery. Some people prefer to use ice alone, heat alone, alternating between ice & heat. Experiment to what works for you. Swelling and bruising can take several weeks to resolve.  3. It is helpful to take an over-the-counter pain medication regularly for the first few weeks. Choose one of the following that works best for you:  1. Naproxen (Aleve, etc) Two 220mg  tabs twice a day 2. Ibuprofen (Advil, etc) Three 200mg  tabs four times a day (every meal & bedtime) 3. Acetaminophen (Tylenol, etc) 500-650mg  four times a day (every meal & bedtime) 4. A prescription for pain medication (such as oxycodone, hydrocodone, etc) should be given to you upon discharge. Take your pain medication as prescribed.  1. If you are having problems/concerns with the prescription medicine (does not control pain, nausea, vomiting, rash, itching, etc), please call us [phone] to see if we need to switch you to a different pain medicine that will work better for you and/or control your side effect better. 2. If you need a refill on your pain medication, please contact your pharmacy. They will contact our office to request authorization. Prescriptions will not be filled after 5 pm or on week-ends. 4. Avoid getting constipated. When taking pain medications, it is common to experience some  constipation. Increasing fluid intake and taking a fiber supplement (such as Metamucil, Citrucel, FiberCon, MiraLax, etc) 1-2 times a day regularly will usually help prevent this problem from occurring. A mild laxative (prune juice, Milk of Magnesia, MiraLax, etc) should be taken according to package directions if there are no bowel movements after 48 hours.  5. Watch out for diarrhea. If you have many loose bowel movements, simplify your diet to bland foods & liquids for a few days. Stop any stool softeners and decrease your fiber supplement. Switching to mild anti-diarrheal medications (Kayopectate, Pepto Bismol) can help. If this worsens or does not improve, please call us. 6. Wash / shower every day. You may shower daily and replace your bandges after showering. No bathing or submerging your wounds in water until they heal. 7. FOLLOW UP in our office  1. Please call [facility] at [phone] to set up an appointment for a follow-up appointment approximately 2-3 weeks after discharge for wound check  WHEN TO CALL US [phone]:  1. Poor pain control 2. Reactions / problems with new medications (rash/itching, nausea, etc)  3. Fever over 101.5 F (38.5 C) 4. Worsening swelling or bruising 5. Continued bleeding from wounds. 6. Increased pain, redness, or drainage from the wounds which could be signs of infection  The clinic staff is available to answer your questions during regular business hours (8:30am-5pm). Please dont hesitate to call and ask to speak to one of our nurses for clinical concerns.  If you have a medical emergency, go to the nearest emergency room or  call 911.  A surgeon from [facility] is always on call at the [facility], [place]  [address], [address], [place], [place] [address] ?  MAIN: [phone] ? TOLL FREE: [phone] ?  FAX [phone]  [email or web address]    WOUND CARE: - dressing to be changed twice  daily - supplies: sterile saline, kerlix, scissors, ABD pads, tape  - remove dressing and all packing carefully, moistening with sterile saline as needed to avoid packing/internal dressing sticking to the wound. - clean edges of skin around the wound with water/gauze, making sure there is no tape debris or leakage left on skin that could cause skin irritation or breakdown. - dampen and clean kerlix with sterile saline and pack wound from wound base to skin level, making sure to take note of any possible areas of wound tracking, tunneling and packing appropriately. Wound can be packed loosely. Trim kerlix to size if a whole kerlix is not required. - cover wound with a dry ABD pad and secure with tape.  - write the date/time on the dry dressing/tape to better track when the last dressing change occurred. - apply any skin protectant/powder recommended by clinician to protect skin/skin folds. - change dressing as needed if leakage occurs, wound gets contaminated, or patient requests to shower. - patient may shower daily with wound open and following the shower the wound should be dried and a clean dressing placed.

## 2016-12-24 NOTE — Progress Notes (Signed)
[facility]/Trauma Progress Note  3 Days Post-Op   Assessment/Plan Hx ADHD Migraines  R axillary abscess - S/P Irrigation and debridement R axillary hidradenitis, Dr. Cliffton AstersWhite, 10/19 - BID wet to dry dressing changes   FEN: reg diet VTE: SCD's, lovenox ID: cultures polymicrobial, Vancomycin 10/18>>   Zosyn 10/20>>   Diflucan 10/21 Foley: none Follow up: Dr. Cliffton AstersWhite 2 weeks  DISPO: Likely home today with PO abx and HH    LOS: 4 days    Subjective:  CC: R axillary pain  No fever or chills overnight. Tolerating diet. Upset with Dr. Derrell LollingIngram cause he did not offer pain medication yesterday morning before dressing change.   Objective: Vital signs in last 24 hours: Temp:  [98 F (36.7 C)-98.8 F (37.1 C)] 98.7 F (37.1 C) (10/22 0506) Pulse Rate:  [66-78] 78 (10/22 0506) Resp:  [16-17] 17 (10/22 0506) BP: (90-104)/(44-52) 104/52 (10/22 0506) SpO2:  [90 %-100 %] 97 % (10/22 0506) Last BM Date: 12/22/16  Intake/Output from previous day: 10/21 0701 - 10/22 0700 In: 1520 [P.O.:660; I.V.:360; IV Piggyback:500] Out: 4100 [Urine:4100] Intake/Output this shift: Total I/O In: 240 [P.O.:240] Out: 600 [Urine:600]  PE: Gen:  Alert, NAD, pleasant, cooperative Card:  RRR, no M/G/R heard Pulm:  CTA, no W/R/R, rate and effort normal Skin: no rashes noted, warm and dry, R axillary wound C/D/I   Anti-infectives: Anti-infectives    Start     Dose/Rate Route Frequency Ordered Stop   12/23/16 1000  fluconazole (DIFLUCAN) tablet 200 mg     200 mg Oral Daily 12/23/16 0910     12/22/16 1300  piperacillin-tazobactam (ZOSYN) IVPB 3.375 g     3.375 g 12.5 mL/hr over 240 Minutes Intravenous Every 8 hours 12/22/16 1223     12/21/16 1000  vancomycin (VANCOCIN) IVPB 1000 mg/200 mL premix     1,000 mg 200 mL/hr over 60 Minutes Intravenous Every 12 hours 12/20/16 2118     12/21/16 0600  piperacillin-tazobactam (ZOSYN) IVPB 3.375 g  Status:  Discontinued     3.375 g 12.5 mL/hr  over 240 Minutes Intravenous Every 8 hours 12/20/16 2118 12/21/16 1147   12/20/16 2115  piperacillin-tazobactam (ZOSYN) IVPB 3.375 g     3.375 g 100 mL/hr over 30 Minutes Intravenous  Once 12/20/16 2109 12/20/16 2225   12/20/16 2115  vancomycin (VANCOCIN) IVPB 1000 mg/200 mL premix     1,000 mg 200 mL/hr over 60 Minutes Intravenous  Once 12/20/16 2109 12/20/16 2314      Lab Results:  No results for input(s): WBC, HGB, HCT, PLT in the last 72 hours. BMET  Recent Labs  12/24/16 0434  NA 135  K 3.9  CL 99*  CO2 27  GLUCOSE 86  BUN 8  CREATININE 0.87  CALCIUM 8.6*   PT/INR No results for input(s): LABPROT, INR in the last 72 hours. CMP     Component Value Date/Time   NA 135 12/24/2016 0434   K 3.9 12/24/2016 0434   CL 99 (L) 12/24/2016 0434   CO2 27 12/24/2016 0434   GLUCOSE 86 12/24/2016 0434   BUN 8 12/24/2016 0434   CREATININE 0.87 12/24/2016 0434   CALCIUM 8.6 (L) 12/24/2016 0434   PROT 7.8 12/19/2016 2155   ALBUMIN 3.7 12/19/2016 2155   AST 18 12/19/2016 2155   ALT 13 (L) 12/19/2016 2155   ALKPHOS 118 12/19/2016 2155   BILITOT 0.8 12/19/2016 2155   GFRNONAA >60 12/24/2016 0434   GFRAA >60 12/24/2016 0434  Lipase  No results found for: LIPASE  Studies/Results: No results found.    Jerre Simon , [facility] 12/24/2016, 9:42 AM Pager: [phone] Consults: [phone] Mon-Fri 7:00 am-4:30 pm Sat-Sun 7:00 am-11:30 am

## 2016-12-24 NOTE — Discharge Summary (Signed)
[facility] Discharge Summary   Patient ID: MEAH JIRON MRN: [id number] DOB/AGE: 26-Oct-1990 26 y.o.  Admit date: 12/20/2016 Discharge date: 12/24/2016  Admitting Diagnosis: Right axillary hydradenitis with absesses leukocytosis  Discharge Diagnosis Patient Active Problem List   Diagnosis Date Noted  . Axillary abscess 12/20/2016  . Well woman exam 12/06/2016  . Vitamin D deficiency 10/18/2014  . S/P tonsillectomy 09/14/2013  . ADHD (attention deficit hyperactivity disorder) 02/11/2012    Consultants none  Imaging: No results found.  Procedures Dr. Cliffton Asters (12/21/16) - Incision and drainage of right axillary hidradenitis/abscesses  Hospital Course:  Pt is a 26 year old obese Caucasian female with a history of axillary hidradenitis status post incision and drainage of right axillary abscess May 2018 comes back in to the ED with a several day history of swelling, tenderness and drainage in her right axilla. Pt was seen in the ER the night prior and discharged on Clindamycin. Pt presented with right axilla hydradenitis with abscesses.  Patient was a direct admitted and underwent procedure listed above.  Tolerated procedure well and was transferred to the floor. Pt was placed on IV antibiotics. On POD#3, the patient was voiding well, tolerating diet, WBC trending down, ambulating well, tolerated dressing changes, vital signs stable, and felt stable for discharge home.  Patient will follow up in our office in 2 weeks and knows to call with questions or concerns. Pt filled prescription for Clindamycin from the ER and instructed pt to take it as prescribed for 7 days.    Patient was discharged in good condition.  The [place] Substance controlled database was reviewed prior to prescribing narcotic pain medication to this patient.  Physical Exam: Gen:  Alert, NAD, pleasant, cooperative Card:  RRR, no M/G/R heard Pulm:  CTA, no W/R/R, rate and effort  normal Skin: no rashes noted, warm and dry, multiple R axillary wounds shallow with no purulent drainage noted, mild surrounding erythema, repacked.  Allergies as of 12/24/2016      Reactions   Iodine Nausea Only   Shellfish Allergy Anaphylaxis      Medication List    STOP taking these medications      oxyCODONE-acetaminophen 5-325 MG tablet Commonly known as:  PERCOCET/ROXICET     TAKE these medications   acetaminophen 500 MG tablet Commonly known as:  TYLENOL 1000 mg every 8 hours till you do not need pain medicine to move your arm around and use it.  You can buy this over the counter without a prescription.   EPINEPHrine 0.3 mg/0.3 mL Soaj injection Commonly known as:  EPI-PEN Inject 0.3 mg into the muscle as needed (for allergic reaction).   HUMIRA 40 MG/0.8ML Pskt Generic drug:  Adalimumab Inject 40 mg into the skin every 7 (seven) days. On Thursday   ibuprofen 200 MG tablet Commonly known as:  ADVIL,MOTRIN Take 800 mg by mouth every 6 (six) hours as needed for moderate pain.   KYLEENA 19.5 MG Iud Generic drug:  Levonorgestrel Kyleena 17.5 mcg/24 hour (5 years) intrauterine device  Take 1 device by intrauterine route.   methylphenidate 20 MG/9HR Commonly known as:  DAYTRANA Place 2 patches onto the skin daily. wear patch for 9 hours only each day   montelukast 10 MG tablet Commonly known as:  SINGULAIR TAKE 1 TABLET AT BEDTIME What changed:  See the new instructions.   naproxen sodium 220 MG tablet Commonly known as:  ANAPROX Take 220 mg by mouth every 12 (twelve) hours as needed (pain).  ondansetron 4 MG disintegrating tablet Commonly known as:  ZOFRAN ODT Take 1 tablet (4 mg total) by mouth every 8 (eight) hours as needed for nausea or vomiting.   oxyCODONE 5 MG immediate release tablet Commonly known as:  Oxy IR/ROXICODONE Take 1 tablet (5 mg total) by mouth every 6 (six) hours as needed (30min before dressing changes).   PRESCRIPTION  MEDICATION Inject into the muscle every 7 (seven) days. Allergy shot        Follow-up Information    [facility] Follow up.   Specialty:  General Surgery Why:  we are working on a follow up appointment for you. Please call our office to see when your appointment is Contact information: [address] [place] [id number] [phone]          Signed: Joyce CopaJessica L [facility] 12/24/2016, 1:37 PM Pager: [phone] Consults: [phone] Mon-Fri 7:00 am-4:30 pm Sat-Sun 7:00 am-11:30 am

## 2016-12-26 DIAGNOSIS — L732 Hidradenitis suppurativa: Secondary | ICD-10-CM | POA: Diagnosis not present

## 2016-12-27 DIAGNOSIS — J3081 Allergic rhinitis due to animal (cat) (dog) hair and dander: Secondary | ICD-10-CM | POA: Diagnosis not present

## 2016-12-27 DIAGNOSIS — J3089 Other allergic rhinitis: Secondary | ICD-10-CM | POA: Diagnosis not present

## 2016-12-27 DIAGNOSIS — J301 Allergic rhinitis due to pollen: Secondary | ICD-10-CM | POA: Diagnosis not present

## 2016-12-31 ENCOUNTER — Encounter (HOSPITAL_COMMUNITY): Payer: Self-pay

## 2016-12-31 DIAGNOSIS — R2232 Localized swelling, mass and lump, left upper limb: Secondary | ICD-10-CM | POA: Diagnosis not present

## 2016-12-31 DIAGNOSIS — Z79899 Other long term (current) drug therapy: Secondary | ICD-10-CM | POA: Diagnosis not present

## 2016-12-31 DIAGNOSIS — L732 Hidradenitis suppurativa: Secondary | ICD-10-CM | POA: Insufficient documentation

## 2016-12-31 NOTE — ED Triage Notes (Signed)
Pt arrives with c/o flare hidradenitis to left arm pit; Pt states she recently had surgery for same thing on the right arm 2 weeks ago; pt c/o pain at 7/10; Pt just took pain meds at 9pm; pt a&ox 4 on arrival and [facility]Monique,RN

## 2017-01-01 ENCOUNTER — Emergency Department (HOSPITAL_COMMUNITY)
Admission: EM | Admit: 2017-01-01 | Discharge: 2017-01-01 | Disposition: A | Discharge status: Home / Self Care | Payer: 59 | Attending: Emergency Medicine | Admitting: Emergency Medicine

## 2017-01-01 DIAGNOSIS — L732 Hidradenitis suppurativa: Secondary | ICD-10-CM

## 2017-01-01 HISTORY — DX: Hidradenitis suppurativa: L73.2

## 2017-01-01 LAB — BASIC METABOLIC PANEL
Anion gap: 9 (ref 5–15)
BUN: 6 mg/dL (ref 6–20)
CO2: 23 mmol/L (ref 22–32)
Calcium: 9.1 mg/dL (ref 8.9–10.3)
Chloride: 105 mmol/L (ref 101–111)
Creatinine, Ser: 0.73 mg/dL (ref 0.44–1.00)
GFR calc Af Amer: >60 mL/min (ref >60)
GFR calc non Af Amer: >60 mL/min (ref >60)
Glucose, Bld: 91 mg/dL (ref 65–99)
Potassium: 3.9 mmol/L (ref 3.5–5.1)
Sodium: 137 mmol/L (ref 135–145)

## 2017-01-01 LAB — CBC WITH DIFFERENTIAL/PLATELET
Basophils Absolute: 0 10*3/uL (ref 0.0–0.1)
Basophils Relative: 0 %
Eosinophils Absolute: 0.2 10*3/uL (ref 0.0–0.7)
Eosinophils Relative: 2 %
HCT: 37.8 % (ref 36.0–46.0)
Hemoglobin: 12.5 g/dL (ref 12.0–15.0)
Lymphocytes Relative: 26 %
Lymphs Abs: 3.5 10*3/uL (ref 0.7–4.0)
MCH: 29.5 pg (ref 26.0–34.0)
MCHC: 33.1 g/dL (ref 30.0–36.0)
MCV: 89.2 fL (ref 78.0–100.0)
Monocytes Absolute: 1.1 10*3/uL — ABNORMAL HIGH (ref 0.1–1.0)
Monocytes Relative: 8 %
Neutro Abs: 8.6 10*3/uL — ABNORMAL HIGH (ref 1.7–7.7)
Neutrophils Relative %: 64 %
Platelets: 357 10*3/uL (ref 150–400)
RBC: 4.24 MIL/uL (ref 3.87–5.11)
RDW: 13.8 % (ref 11.5–15.5)
WBC: 13.4 10*3/uL — ABNORMAL HIGH (ref 4.0–10.5)

## 2017-01-01 MED ORDER — CLINDAMYCIN HCL 150 MG PO CAPS: 450.0000 mg | ORAL_CAPSULE | Freq: Three times a day (TID) | ORAL | 0 refills | Status: DC

## 2017-01-01 MED ORDER — MORPHINE SULFATE (PF) 4 MG/ML IV SOLN
4.0000 mg | Freq: Once | INTRAVENOUS | Status: AC
  Administered 2017-01-01: 4 mg via INTRAVENOUS
  Filled 2017-01-01: qty 1

## 2017-01-01 MED ORDER — CLINDAMYCIN PHOSPHATE 1 % EX GEL: Freq: Two times a day (BID) | CUTANEOUS | 0 refills | Status: DC

## 2017-01-01 NOTE — Progress Notes (Signed)
The sites under her arm is cleaning up. She has a new area mid chest between breast about 2 cm in diameter.  And 1.5 cm high.  Reviewed with DR. Magnus IvanBlackman. He recommend just aspirating site, continue Clindamycin 300 PO TID and clindamycin lotion/Gel to all the sites BID.  She has an appointment with our office next week. ED Ebbie Ridgehris Lawyer will aspirate and send home on these medicines.

## 2017-01-01 NOTE — ED Notes (Signed)
PA in room at this time 

## 2017-01-01 NOTE — ED Provider Notes (Signed)
[facility] EMERGENCY DEPARTMENT Provider Note   CSN: [id number] Arrival date & time: 12/31/16  2205     History   Chief Complaint Chief Complaint  Patient presents with  . Arm Pain    HPI Courtney [name] is a 26 y.o. female.  HPI Patient presents to the emergency department with multiple swollen areas in the left axillary region along with one in between her breasts that started 4 days ago the patient states that she was recently seen and operated on for hidradenitis in the right axilla.  The patient states that she finished taking her antibiotics.  She states that he has not seen her surgeon in follow-up.  The patient denies chest pain, shortness of breath, headache,blurred vision, neck pain, fever, cough, weakness, numbness, dizziness, anorexia, edema, abdominal pain, nausea, vomiting, diarrhea, rash, back pain, dysuria, hematemesis, bloody stool, near syncope, or syncope. Past Medical History:  Diagnosis Date  . ADHD (attention deficit hyperactivity disorder)   . Chronic tonsillitis 08/2013   current strep, will finish antibiotic 09/04/2013; snores during sleep, mother denies apnea  . Heart murmur    "when I was born; outgrew it; I was a preemie"  . Hidradenitis   . Migraine 12/19/2016   "I've just had this one for 3 days" (12/20/2016)    Patient Active Problem List   Diagnosis Date Noted  . Axillary abscess 12/20/2016  . Well woman exam 12/06/2016  . Vitamin D deficiency 10/18/2014  . S/P tonsillectomy 09/14/2013  . ADHD (attention deficit hyperactivity disorder) 02/11/2012    Past Surgical History:  Procedure Laterality Date  . INCISION AND DRAINAGE ABSCESS Right 07/20/2016   Procedure: INCISION AND DRAINAGE ABSCESS right axilla;  Surgeon: Abigail Miyamoto, MD;  Location[facility] OR;  Service: General;  Laterality: Right;  . INTRAUTERINE DEVICE (IUD) INSERTION  11/2016   "had the one in my left arm removed"  . IRRIGATION AND DEBRIDEMENT ABSCESS Right  12/21/2016   Procedure: IRRIGATION AND DEBRIDEMENT RIGHT AXILLARY HIDRADENITIS;  Surgeon: Andria Meuse, MD;  Location: [facility] OR;  Service: General;  Laterality: Right;  . TONSILLECTOMY Bilateral 09/14/2013   Procedure: BILATERAL TONSILLECTOMY;  Surgeon: Serena Colonel, MD;  Location: [facility];  Service: ENT;  Laterality: Bilateral;  . TONSILLECTOMY    . TYMPANOPLASTY Bilateral    "rebuilt eardrums"  . WISDOM TOOTH EXTRACTION      OB History    No data available       Home Medications    Prior to Admission medications   Medication Sig Start Date End Date Taking? Authorizing Provider  acetaminophen (TYLENOL) 500 MG tablet 1000 mg every 8 hours till you do not need pain medicine to move your arm around and use it.  You can buy this over the counter without a prescription. 07/24/16  Yes Sherrie George, PA-C  EPINEPHrine 0.3 mg/0.3 mL IJ SOAJ injection Inject 0.3 mg into the muscle as needed (for allergic reaction).   Yes [provider]  ibuprofen (ADVIL,MOTRIN) 200 MG tablet Take 800 mg by mouth every 6 (six) hours as needed for moderate pain.   Yes [provider]  Levonorgestrel (KYLEENA) 19.5 MG IUD Kyleena 17.5 mcg/24 hour (5 years) intrauterine device  Take 1 device by intrauterine route.   Yes [provider]  methylphenidate (DAYTRANA) 20 MG/9HR Place 2 patches onto the skin daily. wear patch for 9 hours only each day 11/28/16  Yes Dianne Dun, MD  montelukast (SINGULAIR) 10 MG tablet TAKE 1  TABLET AT BEDTIME Patient taking differently: TAKE 10mg  TABLET AT BEDTIME as needed for asthma 02/06/16  Yes Dianne DunAron, Talia M, MD  naproxen sodium (ANAPROX) 220 MG tablet Take 220 mg by mouth every 12 (twelve) hours as needed (pain).   Yes [provider]  ondansetron (ZOFRAN ODT) 4 MG disintegrating tablet Take 1 tablet (4 mg total) by mouth every 8 (eight) hours as needed for nausea or vomiting. 12/24/16  Yes Focht, Jessica L, PA  oxyCODONE  (OXY IR/ROXICODONE) 5 MG immediate release tablet Take 1 tablet (5 mg total) by mouth every 6 (six) hours as needed (30min before dressing changes). 12/24/16  Yes Focht, Jessica L, PA  PRESCRIPTION MEDICATION Inject into the muscle every 7 (seven) days. Allergy shot   Yes [provider]  clindamycin (CLEOCIN) 150 MG capsule Take 3 capsules (450 mg total) by mouth 3 (three) times daily. 01/01/17   Ciella Obi, Cristal Deerhristopher, PA-C  clindamycin (CLINDAGEL) 1 % gel Apply topically 2 (two) times daily. 01/01/17   Charlestine NightLawyer, Raivyn Kabler, PA-C    Family History Family History  Problem Relation Age of Onset  . Hypertension Mother   . Hyperlipidemia Mother     Social History Social History  Substance Use Topics  . Smoking status: Never Smoker  . Smokeless tobacco: Never Used  . Alcohol use 1.2 oz/week    2 Cans of beer per week     Allergies   Iodine and Shellfish allergy   Review of Systems Review of Systems All other systems negative except as documented in the HPI. All pertinent positives and negatives as reviewed in the HPI. Physical Exam Updated Vital Signs BP (!) 90/57 (BP Location: Left Arm)   Pulse 74   Temp 98.4 F (36.9 C) (Oral)   Resp 18   LMP  (LMP Unknown)   SpO2 98%   Physical Exam  Constitutional: She is oriented to person, place, and time. She appears well-developed and well-nourished. No distress.  HENT:  Head: Normocephalic and atraumatic.  Mouth/Throat: Oropharynx is clear and moist.  Eyes: Pupils are equal, round, and reactive to light.  Neck: Normal range of motion. Neck supple.  Cardiovascular: Normal rate, regular rhythm and normal heart sounds.  Exam reveals no gallop and no friction rub.   No murmur heard. Pulmonary/Chest: Effort normal and breath sounds normal. No respiratory distress. She has no wheezes.  Abdominal: Soft. Bowel sounds are normal. She exhibits no distension. There is no tenderness.  Neurological: She is alert and oriented to  person, place, and time. She exhibits normal muscle tone. Coordination normal.  Skin: Skin is warm and dry. Capillary refill takes less than 2 seconds. No rash noted. No erythema.  The patient has several areas that are swollen with surrounding redness in her left axilla the patient also has a swollen area with fluid in the area between her breasts.   Psychiatric: She has a normal mood and affect. Her behavior is normal.  Nursing note and vitals reviewed.    ED Treatments / Results  Labs (all labs ordered are listed, but only abnormal results are displayed) Labs Reviewed  CBC WITH DIFFERENTIAL/PLATELET - Abnormal; Notable for the following:       Result Value   WBC 13.4 (*)    Neutro Abs 8.6 (*)    Monocytes Absolute 1.1 (*)    All other components within normal limits  BASIC METABOLIC PANEL    EKG  EKG Interpretation None       Radiology No results found.  Procedures Procedures (including critical care time)  Medications Ordered in ED Medications  morphine 4 MG/ML injection 4 mg (4 mg Intravenous Given 01/01/17 0843)     Initial Impression / Assessment and Plan / ED Course  I have reviewed the triage vital signs and the nursing notes.  Pertinent labs & imaging results that were available during my care of the patient were reviewed by me and considered in my medical decision making (see chart for details).     I spoke with general surgery who did come evaluate the patient.  They will see the patient in the office.  The patient will be started on clindamycin along with Clindagel.  Patient agrees the plan and all questions were answered  I did at the request of the surgeon do a needle aspiration of an abscess in the breast region.   Final Clinical Impressions(s) / ED Diagnoses   Final diagnoses:  Hidradenitis suppurativa    New Prescriptions Discharge Medication List as of 01/01/2017 12:35 PM    START taking these medications   Details  clindamycin  (CLINDAGEL) 1 % gel Apply topically 2 (two) times daily., Starting Tue 01/01/2017, Print         Nova Evett, [place], PA-C 01/02/17 1545    Charlynne Pander, MD 01/02/17 [phone]

## 2017-01-01 NOTE — Discharge Instructions (Signed)
Follow-up with your surgeon as directed.  return here as needed

## 2017-01-01 NOTE — ED Notes (Addendum)
RN notified of decrease in BP

## 2017-01-08 ENCOUNTER — Encounter (HOSPITAL_COMMUNITY): Payer: Self-pay | Admitting: Orthopedic Surgery

## 2017-01-08 ENCOUNTER — Observation Stay (HOSPITAL_COMMUNITY): Payer: 59 | Admitting: Certified Registered"

## 2017-01-08 ENCOUNTER — Encounter (HOSPITAL_COMMUNITY): Admission: AD | Disposition: A | Discharge status: Home / Self Care | Payer: Self-pay | Source: Ambulatory Visit

## 2017-01-08 ENCOUNTER — Observation Stay (HOSPITAL_COMMUNITY)
Admission: AD | Admit: 2017-01-08 | Discharge: 2017-01-12 | Disposition: A | Discharge status: Home / Self Care | Payer: 59 | Source: Ambulatory Visit | Attending: General Surgery | Admitting: General Surgery

## 2017-01-08 DIAGNOSIS — L02419 Cutaneous abscess of limb, unspecified: Secondary | ICD-10-CM | POA: Diagnosis present

## 2017-01-08 DIAGNOSIS — Z91013 Allergy to seafood: Secondary | ICD-10-CM | POA: Insufficient documentation

## 2017-01-08 DIAGNOSIS — L732 Hidradenitis suppurativa: Secondary | ICD-10-CM | POA: Diagnosis not present

## 2017-01-08 DIAGNOSIS — Z79899 Other long term (current) drug therapy: Secondary | ICD-10-CM | POA: Diagnosis not present

## 2017-01-08 DIAGNOSIS — F909 Attention-deficit hyperactivity disorder, unspecified type: Secondary | ICD-10-CM | POA: Diagnosis not present

## 2017-01-08 DIAGNOSIS — Z888 Allergy status to other drugs, medicaments and biological substances status: Secondary | ICD-10-CM | POA: Insufficient documentation

## 2017-01-08 DIAGNOSIS — Z91041 Radiographic dye allergy status: Secondary | ICD-10-CM | POA: Insufficient documentation

## 2017-01-08 DIAGNOSIS — L02412 Cutaneous abscess of left axilla: Secondary | ICD-10-CM | POA: Diagnosis not present

## 2017-01-08 DIAGNOSIS — E559 Vitamin D deficiency, unspecified: Secondary | ICD-10-CM | POA: Insufficient documentation

## 2017-01-08 HISTORY — PX: IRRIGATION AND DEBRIDEMENT ABSCESS: SHX5252

## 2017-01-08 LAB — BASIC METABOLIC PANEL
Anion gap: 9 (ref 5–15)
BUN: 5 mg/dL — ABNORMAL LOW (ref 6–20)
CALCIUM: 9.4 mg/dL (ref 8.9–10.3)
CO2: 27 mmol/L (ref 22–32)
Chloride: 102 mmol/L (ref 101–111)
Creatinine, Ser: 0.63 mg/dL (ref 0.44–1.00)
GFR calc Af Amer: >60 mL/min (ref >60)
GFR calc non Af Amer: >60 mL/min (ref >60)
Glucose, Bld: 87 mg/dL (ref 65–99)
POTASSIUM: 4.2 mmol/L (ref 3.5–5.1)
Sodium: 138 mmol/L (ref 135–145)

## 2017-01-08 LAB — CBC
HCT: 35.9 % — ABNORMAL LOW (ref 36.0–46.0)
Hemoglobin: 11.8 g/dL — ABNORMAL LOW (ref 12.0–15.0)
MCH: 29 pg (ref 26.0–34.0)
MCHC: 32.9 g/dL (ref 30.0–36.0)
MCV: 88.2 fL (ref 78.0–100.0)
PLATELETS: 399 10*3/uL (ref 150–400)
RBC: 4.07 MIL/uL (ref 3.87–5.11)
RDW: 13.2 % (ref 11.5–15.5)
WBC: 15.1 10*3/uL — ABNORMAL HIGH (ref 4.0–10.5)

## 2017-01-08 LAB — SURGICAL PCR SCREEN
MRSA, PCR: NEGATIVE
STAPHYLOCOCCUS AUREUS: NEGATIVE

## 2017-01-08 LAB — I-STAT BETA HCG BLOOD, ED (NOT ORDERABLE)

## 2017-01-08 SURGERY — IRRIGATION AND DEBRIDEMENT ABSCESS
Anesthesia: General | Site: Axilla | Laterality: Left

## 2017-01-08 MED ORDER — SODIUM CHLORIDE 0.9 % IV SOLN
INTRAVENOUS | Status: DC
Start: 2017-01-08 — End: 2017-01-09
  Administered 2017-01-08 – 2017-01-09 (×2): via INTRAVENOUS

## 2017-01-08 MED ORDER — DIPHENHYDRAMINE HCL 50 MG/ML IJ SOLN
12.5000 mg | INTRAMUSCULAR | Status: AC | PRN
  Administered 2017-01-08 (×2): 12.5 mg via INTRAVENOUS

## 2017-01-08 MED ORDER — PIPERACILLIN-TAZOBACTAM 3.375 G IVPB
3.3750 g | Freq: Three times a day (TID) | INTRAVENOUS | Status: DC
  Administered 2017-01-08 – 2017-01-09 (×3): 3.375 g via INTRAVENOUS
  Filled 2017-01-08 (×5): qty 50

## 2017-01-08 MED ORDER — PROPOFOL 10 MG/ML IV BOLUS
INTRAVENOUS | Status: DC | PRN
  Administered 2017-01-08: 50 mg via INTRAVENOUS
  Administered 2017-01-08: 150 mg via INTRAVENOUS

## 2017-01-08 MED ORDER — LACTATED RINGERS IV SOLN: INTRAVENOUS | Status: DC

## 2017-01-08 MED ORDER — PROPOFOL 10 MG/ML IV BOLUS
INTRAVENOUS | Status: AC
  Filled 2017-01-08: qty 20

## 2017-01-08 MED ORDER — FENTANYL CITRATE (PF) 100 MCG/2ML IJ SOLN
25.0000 ug | INTRAMUSCULAR | Status: DC | PRN
  Administered 2017-01-08 (×2): 50 ug via INTRAVENOUS

## 2017-01-08 MED ORDER — LACTATED RINGERS IV SOLN
INTRAVENOUS | Status: DC | PRN
  Administered 2017-01-08: 17:00:00 via INTRAVENOUS

## 2017-01-08 MED ORDER — BUPIVACAINE-EPINEPHRINE 0.25% -1:200000 IJ SOLN: INTRAMUSCULAR | Status: DC | PRN

## 2017-01-08 MED ORDER — OXYCODONE HCL 5 MG PO TABS: 5.0000 mg | ORAL_TABLET | Freq: Once | ORAL | Status: DC | PRN

## 2017-01-08 MED ORDER — OXYCODONE HCL 5 MG/5ML PO SOLN: 5.0000 mg | Freq: Once | ORAL | Status: DC | PRN

## 2017-01-08 MED ORDER — VANCOMYCIN HCL IN DEXTROSE 1-5 GM/200ML-% IV SOLN
1000.0000 mg | Freq: Two times a day (BID) | INTRAVENOUS | Status: DC
  Filled 2017-01-08 (×2): qty 200

## 2017-01-08 MED ORDER — DIPHENHYDRAMINE HCL 25 MG PO CAPS
25.0000 mg | ORAL_CAPSULE | Freq: Four times a day (QID) | ORAL | Status: DC | PRN
  Administered 2017-01-12: 25 mg via ORAL
  Filled 2017-01-08: qty 1

## 2017-01-08 MED ORDER — MORPHINE SULFATE (PF) 4 MG/ML IV SOLN
2.0000 mg | INTRAVENOUS | Status: DC | PRN
  Administered 2017-01-08 – 2017-01-09 (×4): 2 mg via INTRAVENOUS
  Filled 2017-01-08 (×4): qty 1

## 2017-01-08 MED ORDER — MIDAZOLAM HCL 5 MG/5ML IJ SOLN
INTRAMUSCULAR | Status: DC | PRN
Start: 2017-01-08 — End: 2017-01-08
  Administered 2017-01-08: 2 mg via INTRAVENOUS

## 2017-01-08 MED ORDER — BUPIVACAINE-EPINEPHRINE (PF) 0.25% -1:200000 IJ SOLN
INTRAMUSCULAR | Status: AC
  Filled 2017-01-08: qty 30

## 2017-01-08 MED ORDER — ONDANSETRON HCL 4 MG/2ML IJ SOLN: 4.0000 mg | Freq: Once | INTRAMUSCULAR | Status: DC | PRN

## 2017-01-08 MED ORDER — FENTANYL CITRATE (PF) 250 MCG/5ML IJ SOLN
INTRAMUSCULAR | Status: AC
  Filled 2017-01-08: qty 5

## 2017-01-08 MED ORDER — DIPHENHYDRAMINE HCL 50 MG/ML IJ SOLN
INTRAMUSCULAR | Status: AC
  Filled 2017-01-08: qty 1

## 2017-01-08 MED ORDER — VANCOMYCIN HCL IN DEXTROSE 1-5 GM/200ML-% IV SOLN
1000.0000 mg | INTRAVENOUS | Status: AC
  Administered 2017-01-08: 1000 mg via INTRAVENOUS
  Filled 2017-01-08: qty 200

## 2017-01-08 MED ORDER — PHENYLEPHRINE HCL 10 MG/ML IJ SOLN
INTRAMUSCULAR | Status: DC | PRN
  Administered 2017-01-08: 160 ug via INTRAVENOUS

## 2017-01-08 MED ORDER — 0.9 % SODIUM CHLORIDE (POUR BTL) OPTIME
TOPICAL | Status: DC | PRN
  Administered 2017-01-08: 1000 mL

## 2017-01-08 MED ORDER — MIDAZOLAM HCL 2 MG/2ML IJ SOLN
INTRAMUSCULAR | Status: AC
  Filled 2017-01-08: qty 2

## 2017-01-08 MED ORDER — ONDANSETRON 4 MG PO TBDP: 4.0000 mg | ORAL_TABLET | Freq: Four times a day (QID) | ORAL | Status: DC | PRN

## 2017-01-08 MED ORDER — FENTANYL CITRATE (PF) 100 MCG/2ML IJ SOLN
INTRAMUSCULAR | Status: AC
  Administered 2017-01-08: 50 ug via INTRAVENOUS
  Filled 2017-01-08: qty 2

## 2017-01-08 MED ORDER — ONDANSETRON HCL 4 MG/2ML IJ SOLN
4.0000 mg | Freq: Four times a day (QID) | INTRAMUSCULAR | Status: DC | PRN
Start: 2017-01-08 — End: 2017-01-12

## 2017-01-08 MED ORDER — FENTANYL CITRATE (PF) 100 MCG/2ML IJ SOLN
INTRAMUSCULAR | Status: DC | PRN
  Administered 2017-01-08 (×3): 50 ug via INTRAVENOUS
  Administered 2017-01-08: 100 ug via INTRAVENOUS

## 2017-01-08 MED ORDER — LIDOCAINE 2% (20 MG/ML) 5 ML SYRINGE
INTRAMUSCULAR | Status: AC
  Filled 2017-01-08: qty 5

## 2017-01-08 MED ORDER — DIPHENHYDRAMINE HCL 50 MG/ML IJ SOLN: 25.0000 mg | Freq: Four times a day (QID) | INTRAMUSCULAR | Status: DC | PRN

## 2017-01-08 MED ORDER — LIDOCAINE HCL (CARDIAC) 20 MG/ML IV SOLN
INTRAVENOUS | Status: DC | PRN
  Administered 2017-01-08: 40 mg via INTRAVENOUS

## 2017-01-08 SURGICAL SUPPLY — 44 items
BNDG GAUZE ELAST 4 BULKY (GAUZE/BANDAGES/DRESSINGS) ×3 IMPLANT
CANISTER SUCT 3000ML PPV (MISCELLANEOUS) IMPLANT
CLEANER TIP ELECTROSURG 2X2 (MISCELLANEOUS) ×3 IMPLANT
COVER SURGICAL LIGHT HANDLE (MISCELLANEOUS) ×3 IMPLANT
DECANTER SPIKE VIAL GLASS SM (MISCELLANEOUS) IMPLANT
DRAPE HALF SHEET 40X57 (DRAPES) IMPLANT
DRAPE LAPAROSCOPIC ABDOMINAL (DRAPES) ×3 IMPLANT
DRAPE UTILITY XL STRL (DRAPES) ×6 IMPLANT
DRSG PAD ABDOMINAL 8X10 ST (GAUZE/BANDAGES/DRESSINGS) IMPLANT
ELECT REM PT RETURN 9FT ADLT (ELECTROSURGICAL) ×3
ELECTRODE REM PT RTRN 9FT ADLT (ELECTROSURGICAL) ×1 IMPLANT
GAUZE SPONGE 4X4 12PLY STRL (GAUZE/BANDAGES/DRESSINGS) ×3 IMPLANT
GAUZE SPONGE 4X4 16PLY XRAY LF (GAUZE/BANDAGES/DRESSINGS) IMPLANT
GLOVE BIO SURGEON STRL SZ 6.5 (GLOVE) ×2 IMPLANT
GLOVE BIO SURGEON STRL SZ7.5 (GLOVE) ×3 IMPLANT
GLOVE BIO SURGEONS STRL SZ 6.5 (GLOVE) ×1
GLOVE BIOGEL PI IND STRL 6.5 (GLOVE) ×1 IMPLANT
GLOVE BIOGEL PI INDICATOR 6.5 (GLOVE) ×2
GOWN STRL REUS W/ TWL LRG LVL3 (GOWN DISPOSABLE) ×3 IMPLANT
GOWN STRL REUS W/TWL LRG LVL3 (GOWN DISPOSABLE) ×6
KIT BASIN OR (CUSTOM PROCEDURE TRAY) ×3 IMPLANT
KIT ROOM TURNOVER OR (KITS) ×3 IMPLANT
NEEDLE HYPO 25GX1X1/2 BEV (NEEDLE) ×3 IMPLANT
NS IRRIG 1000ML POUR BTL (IV SOLUTION) ×3 IMPLANT
PACK SURGICAL SETUP 50X90 (CUSTOM PROCEDURE TRAY) ×3 IMPLANT
PAD ABD 8X10 STRL (GAUZE/BANDAGES/DRESSINGS) ×3 IMPLANT
PAD ARMBOARD 7.5X6 YLW CONV (MISCELLANEOUS) ×6 IMPLANT
PENCIL BUTTON HOLSTER BLD 10FT (ELECTRODE) ×3 IMPLANT
SPECIMEN JAR SMALL (MISCELLANEOUS) IMPLANT
SPONGE LAP 18X18 X RAY DECT (DISPOSABLE) ×3 IMPLANT
SUT MNCRL AB 4-0 PS2 18 (SUTURE) IMPLANT
SUT VIC AB 3-0 SH 27 (SUTURE)
SUT VIC AB 3-0 SH 27XBRD (SUTURE) IMPLANT
SWAB COLLECTION DEVICE MRSA (MISCELLANEOUS) ×3 IMPLANT
SWAB CULTURE ESWAB REG 1ML (MISCELLANEOUS) ×3 IMPLANT
SYR BULB 3OZ (MISCELLANEOUS) ×3 IMPLANT
SYR CONTROL 10ML LL (SYRINGE) IMPLANT
TAPE CLOTH SURG 6X10 WHT LF (GAUZE/BANDAGES/DRESSINGS) ×3 IMPLANT
TOWEL OR 17X24 6PK STRL BLUE (TOWEL DISPOSABLE) ×3 IMPLANT
TOWEL OR 17X26 10 PK STRL BLUE (TOWEL DISPOSABLE) ×3 IMPLANT
TUBE CONNECTING 12'X1/4 (SUCTIONS)
TUBE CONNECTING 12X1/4 (SUCTIONS) IMPLANT
WATER STERILE IRR 1000ML POUR (IV SOLUTION) IMPLANT
YANKAUER SUCT BULB TIP NO VENT (SUCTIONS) IMPLANT

## 2017-01-08 NOTE — Op Note (Signed)
01/08/2017  5:37 PM  PATIENT:  Courtney [name]  26 y.o. female  PRE-OPERATIVE DIAGNOSIS:  LEFT AXILLARY ABSCESS  POST-OPERATIVE DIAGNOSIS:  LEFT AXILLARY ABSCESS  PROCEDURE:  Procedure(s): IRRIGATION AND DEBRIDEMENT AXILLARY ABSCESS (Left)  SURGEON:  Surgeon(s) and Role:    * Griselda Mineroth, Billie Intriago III, MD - Primary  PHYSICIAN ASSISTANT:   ASSISTANTS: none   ANESTHESIA:   general  EBL:  50 mL   BLOOD ADMINISTERED:none  DRAINS: none   LOCAL MEDICATIONS USED:  NONE  SPECIMEN:  No Specimen  DISPOSITION OF SPECIMEN:  N/A  COUNTS:  YES  TOURNIQUET:  * No tourniquets in log *  DICTATION: .Dragon Dictation   After informed consent was obtained the patient was brought to the operating room and placed in the supine position on the operating table.  After adequate induction of general anesthesia the patient's left chest and axillary area were prepped with Betadine and draped in usual sterile manner.  There was an open area in the indurated fluctuant left axilla.  This area was probed with a hemostat and the large abscess cavity was identified.  An incision was made in the skin and subcutaneous tissue overlying the abscess cavity with a Bovie electrocautery so that the entire cavity was opened.  A moderate amount of purulent material was evacuated.  Cultures were obtained.  Hemostasis was then achieved by fulgurating the abscess cavity with the electrocautery.  The wound was then packed with Kerlix gauze and sterile dressings were applied.  The patient tolerated procedure well.  At the end of the case all needle sponge and instrument counts were correct.  The patient was then awakened and taken to recovery in stable condition.  PLAN OF CARE: Admit to inpatient   PATIENT DISPOSITION:  PACU - hemodynamically stable.   Delay start of Pharmacological VTE agent (>24hrs) due to surgical blood loss or risk of bleeding: no

## 2017-01-08 NOTE — Interval H&P Note (Signed)
History and Physical Interval Note:  01/08/2017 4:20 PM  Courtney [name]  has presented today for surgery, with the diagnosis of LEFT AXILLARY ABSCESS  The various methods of treatment have been discussed with the patient and family. After consideration of risks, benefits and other options for treatment, the patient has consented to  Procedure(s): IRRIGATION AND DEBRIDEMENT AXILLARY ABSCESS (Left) as a surgical intervention .  The patient's history has been reviewed, patient examined, no change in status, stable for surgery.  I have reviewed the patient's chart and labs.  Questions were answered to the patient's satisfaction.     TOTH III,Donicia Druck S

## 2017-01-08 NOTE — H&P (Signed)
I have seen and examined the patient and agree that she needs I and D of left axilla in OR. Risks and benefits discussed with the patient and she understands and wishes to proceed.

## 2017-01-08 NOTE — Transfer of Care (Signed)
Immediate Anesthesia Transfer of Care Note  Patient: Courtney [name]  Procedure(s) Performed: IRRIGATION AND DEBRIDEMENT AXILLARY ABSCESS (Left Axilla)  Patient Location: PACU  Anesthesia Type:General  Level of Consciousness: sedated and patient cooperative  Airway & Oxygen Therapy: Patient Spontanous Breathing  Post-op Assessment: Report given to RN and Post -op Vital signs reviewed and stable  Post vital signs: Reviewed and stable  Last Vitals:  Vitals:   01/08/17 1335  BP: (!) 99/55  Pulse: 88  Resp: 20  Temp: 37.3 C  SpO2: 100%    Last Pain:  Vitals:   01/08/17 1540  TempSrc:   PainSc: 9          Complications: No apparent anesthesia complications

## 2017-01-08 NOTE — H&P (Signed)
Courtney ApleyBrandy L [name] 01/08/2017 10:13 AM Location: [facility] Patient #: [id number] DOB: 1990-07-20 Single / Language: [place] / Race: White Female  History of Present Illness  The patient is a 26 year old female presenting for a post-operative visit. She is presenting status post incision and drainage of right axillary abscess/hidradenitis on 12/21/16 by Dr. Cliffton AstersWhite. (First I&D was 07/20/16 by Dr. Magnus IvanBlackman). She states her right axilla is doing well at this time and she is still doing WTD dressings. She does not have much pain in this area. Denies purulent drainage.  She went to the ER on 01/01/17 for left axillary swelling and pain. She also had an active abscess between her breasts which was aspirated in the ED at bedside, per surgery. This is healing well.  However, her left axilla has become more painful and an abscess has developed in last few days, no drainage. She has another area medial to the active abscess which is intermittently draining.She also has a small abscess on her medial, superior left arm which has not drained yet. She states she is unable to lift her arm because of the pain. Although she has not had high temperature readings the past few days, she admits to subjective fever/chills.Denies nausea/vomiting.   She is currently taking oral Clindamycin and applying Clindagel. She last ate some muffins at 5:30 AM and had one sip of Dr. Reino KentPepper on her way to this appt.    Allergies  SHELLFISH Anaphylaxis. Allergies Reconciled  Medication History  Daytrana (20MG /9HR Patch, Transdermal) Active. Tylenol (500MG  Capsule, Oral) Active. EPINEPHrine (0.3MG /0.3ML Device, Injection) Active. Singulair (10MG  Tablet, Oral) Active. Mupirocin (2% Ointment, External) Active. Clindamycin HCl (150MG  Capsule, Oral) Active. Clindamycin Phosphate (1% Gel, External) Active. Medications Reconciled  Vitals    01/08/2017 10:13 AM Weight: 174.2 lb Height: 61in Body Surface  Area: 1.78 m Body Mass Index: 32.91 kg/m  Temp.: 97.69F  Pulse: 97 (Regular)  BP: 118/82 (Sitting, Left Arm, Standard)   Physical Exam Integumentary Note: Left axilla: 6 cm x 4 cm erythematous, swollen, fluctuant area inferior and posterior to axillary crease - VERY tender to palpation Small area of erythema (1.5 cm x 2cm) medial to large abscess which is draining Small area of erythema and fluctuance (2 cm x 2 cm) on medial, superior upper arm - no active drainage  Right axilla: Several open wounds which are healing well Mild amount of surrouding erythema, but no obvious cellulitis, induration, or fluctuance   Assessment & Plan ABSCESS OF LEFT AXILLA (L02.412) I believe the abscess in her left axilla needs to be drained to prevent worsening infection.  I offered to drain the abscess in clinic, but given the amount of tenderness on exam, she requested to be sent to the OR for I&D.  I do not believe this is unreasonable, given she is so tender that she will not allow me to touch the abscess.    Admitting physician is aware.  She will be called from the hospital once a bed is available for direct admission.  She was told to stay NPO and provided verbal understanding.

## 2017-01-08 NOTE — Anesthesia Postprocedure Evaluation (Signed)
Anesthesia Post Note  Patient: Courtney ApleyBrandy L [name]  Procedure(s) Performed: IRRIGATION AND DEBRIDEMENT AXILLARY ABSCESS (Left Axilla)     Patient location during evaluation: PACU Anesthesia Type: General Level of consciousness: awake, awake and alert and oriented Pain management: pain level controlled Vital Signs Assessment: post-procedure vital signs reviewed and stable Respiratory status: spontaneous breathing, nonlabored ventilation and respiratory function stable Cardiovascular status: blood pressure returned to baseline    Last Vitals:  Vitals:   01/08/17 1820 01/08/17 1851  BP: 98/67 96/63  Pulse: 68 73  Resp: 17 18  Temp: 36.7 C 36.4 C  SpO2: 100% 100%    Last Pain:  Vitals:   01/08/17 1851  TempSrc: Oral  PainSc:                  Maeley Matton COKER

## 2017-01-08 NOTE — Progress Notes (Signed)
Pharmacy Antibiotic Note  Courtney [name] is a 26 y.o. female admitted on 01/08/2017 with right axilliary abscess/hidradenitis. S/p I&D this afternoon, and previously on 12/1916. Pharmacy has been consulted for Vancomycin dosing.    Vancomycin 1gm IV given pre-op ~5pm.   First Zosyn dose given ~3:30pm  Plan:  Vancomycin 1 gm IV q12hrs.  Target Vancomycin troughs 10-15 mcg/ml.  Also on Zosyn 3.375 gm IV q8hrs (each over 4 hours)  Follow renal function, culture data, progress.  Height: 5\' 1"  (154.9 cm) Weight: 170 lb (77.1 kg)(per patient) IBW/kg (Calculated) : 47.8  Temp (24hrs), Avg:98.2 F (36.8 C), Min:97.6 F (36.4 C), Max:99.1 F (37.3 C)  Recent Labs  Lab 01/08/17 1424  WBC 15.1*  CREATININE 0.63    Estimated Creatinine Clearance: 101.9 mL/min (by C-G formula based on SCr of 0.63 mg/dL).    Allergies  Allergen Reactions  . Iodine Nausea Only  . Shellfish Allergy Anaphylaxis  . Tape Hives    Pt can only tolerate cloth tape    Antimicrobials this admission:    Vancomycin 11/6>>    Zosyn 11/6>>  Dose adjustments this admission:   N/a  Microbiology results:   11/6 MRSA PCR - negative   11/6 abscess (surgical/deep wound) -  Thank you for allowing pharmacy to be a part of this patient's care.  Dennie Fettersgan, Starleen Trussell Donovan, [place] Pager: [phone] 01/08/2017 8:17 PM

## 2017-01-08 NOTE — Anesthesia Preprocedure Evaluation (Signed)
Anesthesia Evaluation  Patient identified by MRN, date of birth, ID band Patient awake    Reviewed: Allergy & Precautions, NPO status , Patient's Chart, lab work & pertinent test results  Airway Mallampati: II  TM Distance: >3 FB Neck ROM: Full    Dental  (+) Teeth Intact, Dental Advisory Given   Pulmonary    breath sounds clear to auscultation       Cardiovascular  Rhythm:Regular Rate:Normal     Neuro/Psych    GI/Hepatic   Endo/Other    Renal/GU      Musculoskeletal   Abdominal   Peds  Hematology   Anesthesia Other Findings   Reproductive/Obstetrics                             Anesthesia Physical Anesthesia Plan  ASA: II  Anesthesia Plan: General   Post-op Pain Management:    Induction: Intravenous  PONV Risk Score and Plan: 3 and Ondansetron, Dexamethasone and Scopolamine patch - Pre-op  Airway Management Planned: LMA  Additional Equipment:   Intra-op Plan:   Post-operative Plan: Extubation in OR  Informed Consent: I have reviewed the patients History and Physical, chart, labs and discussed the procedure including the risks, benefits and alternatives for the proposed anesthesia with the patient or authorized representative who has indicated his/her understanding and acceptance.   Dental advisory given  Plan Discussed with: CRNA and Anesthesiologist  Anesthesia Plan Comments:         Anesthesia Quick Evaluation

## 2017-01-08 NOTE — Anesthesia Procedure Notes (Addendum)
Procedure Name: LMA Insertion Date/Time: 01/08/2017 5:14 PM Performed by: Rosiland OzMeyers, Amna Welker, CRNA Pre-anesthesia Checklist: Emergency Drugs available, Patient identified, Suction available, Patient being monitored and Timeout performed Patient Re-evaluated:Patient Re-evaluated prior to induction Oxygen Delivery Method: Circle system utilized Preoxygenation: Pre-oxygenation with 100% oxygen Induction Type: IV induction LMA: LMA inserted LMA Size: 4.0 Number of attempts: 1 Placement Confirmation: positive ETCO2 and breath sounds checked- equal and bilateral Tube secured with: Tape Dental Injury: Teeth and Oropharynx as per pre-operative assessment

## 2017-01-09 ENCOUNTER — Encounter (HOSPITAL_COMMUNITY): Payer: Self-pay | Admitting: General Surgery

## 2017-01-09 DIAGNOSIS — L02412 Cutaneous abscess of left axilla: Secondary | ICD-10-CM | POA: Diagnosis not present

## 2017-01-09 LAB — HIV ANTIBODY (ROUTINE TESTING W REFLEX): HIV Screen 4th Generation wRfx: NONREACTIVE

## 2017-01-09 MED ORDER — SACCHAROMYCES BOULARDII 250 MG PO CAPS
250.0000 mg | ORAL_CAPSULE | Freq: Two times a day (BID) | ORAL | Status: DC
  Administered 2017-01-09 – 2017-01-12 (×6): 250 mg via ORAL
  Filled 2017-01-09 (×6): qty 1

## 2017-01-09 MED ORDER — HYDROMORPHONE HCL 1 MG/ML PO LIQD
1.5000 mg | ORAL | Status: DC | PRN
  Administered 2017-01-10 – 2017-01-12 (×7): 1.5 mg via ORAL
  Filled 2017-01-09 (×9): qty 2

## 2017-01-09 MED ORDER — METHYLPHENIDATE 20 MG/9HR TD PTCH: 2.0000 | MEDICATED_PATCH | Freq: Every day | TRANSDERMAL | Status: DC

## 2017-01-09 MED ORDER — HYDROMORPHONE HCL 1 MG/ML IJ SOLN: 0.5000 mg | INTRAMUSCULAR | Status: DC | PRN

## 2017-01-09 MED ORDER — HYDROMORPHONE HCL 1 MG/ML IJ SOLN
INTRAMUSCULAR | Status: AC
  Administered 2017-01-09: 1 mg
  Filled 2017-01-09: qty 1

## 2017-01-09 MED ORDER — LIDOCAINE-EPINEPHRINE 2 %-1:100000 IJ SOLN
20.0000 mL | Freq: Once | INTRAMUSCULAR | Status: AC
  Administered 2017-01-09: 20 mL
  Filled 2017-01-09: qty 20

## 2017-01-09 MED ORDER — ENOXAPARIN SODIUM 40 MG/0.4ML ~~LOC~~ SOLN
40.0000 mg | SUBCUTANEOUS | Status: DC
  Administered 2017-01-09 – 2017-01-11 (×3): 40 mg via SUBCUTANEOUS
  Filled 2017-01-09 (×3): qty 0.4

## 2017-01-09 MED ORDER — AMOXICILLIN-POT CLAVULANATE 875-125 MG PO TABS
1.0000 | ORAL_TABLET | Freq: Two times a day (BID) | ORAL | Status: DC
  Administered 2017-01-09 – 2017-01-12 (×6): 1 via ORAL
  Filled 2017-01-09 (×6): qty 1

## 2017-01-09 NOTE — Progress Notes (Signed)
Patient called out about a knott that she felt on her the back of her right leg.  I inspected the area, and there is a bruise over the area.  There was not s/s of infections - redness, warmth, or discharge from the area. Night RN was advised of finding

## 2017-01-09 NOTE — Progress Notes (Signed)
1 Day Post-Op    CC:  Left axillary infection  Subjective: She is having trouble with the Vancomycin and refusing.  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  She cannot get an IV in even with the IV team so I am going to switch her to Oral and some IM pain medicines for dressing change.  We worked all day to get the current packing out.  Started her on showers and still having issues.  Will leave current dressing in place and work on getting it out again tomorrow.  Objective: Vital signs in last 24 hours: Temp:  [97.6 F (36.4 C)-99.1 F (37.3 C)] 98.3 F (36.8 C) (11/07 0504) Pulse Rate:  [68-88] 85 (11/07 0504) Resp:  [17-20] 18 (11/07 0504) BP: (96-108)/(54-67) 108/59 (11/07 0504) SpO2:  [98 %-100 %] 98 % (11/07 0504) Weight:  [77.1 kg (170 lb)] 77.1 kg (170 lb) (11/06 1600) Last BM Date: 01/08/17  Intake/Output from previous day: 11/06 0701 - 11/07 0700 In: 2130 [P.O.:120; I.V.:1960; IV Piggyback:50] Out: 50 [Blood:50] Intake/Output this shift: No intake/output data recorded.  General appearance: alert, cooperative, mild distress and with dressing change.  We had to treat her pain with significant pain meds. Resp: clear to auscultation bilaterally Skin: Skin color, texture, turgor normal. No rashes or lesions or Open site is bloody and we are still trying to get the last of the Kerlix out.  Will pack, wet todry, and add showers.  Lab Results:  Recent Labs    01/08/17 1424  WBC 15.1*  HGB 11.8*  HCT 35.9*  PLT 399    BMET Recent Labs    01/08/17 1424  NA 138  K 4.2  CL 102  CO2 27  GLUCOSE 87  BUN 5*  CREATININE 0.63  CALCIUM 9.4   PT/INR No results for input(s): LABPROT, INR in the last 72 hours.  No results for input(s): AST, ALT, ALKPHOS, BILITOT, PROT, ALBUMIN in the last 168 hours.   Lipase  No results found for: LIPASE   Medications:   Prior to Admission medications   Medication Sig Start Date End Date Taking? Authorizing Provider  acetaminophen  (TYLENOL) 500 MG tablet 1000 mg every 8 hours till you do not need pain medicine to move your arm around and use it.  You can buy this over the counter without a prescription. 07/24/16  Yes Courtney GeorgeJennings, Loraine Freid, [name]  clindamycin (CLEOCIN) 150 MG capsule Take 3 capsules (450 mg total) by mouth 3 (three) times daily. 01/01/17  Yes Courtney [name]  clindamycin (CLINDAGEL) 1 % gel Apply topically 2 (two) times daily. 01/01/17  Yes Courtney [name]  EPINEPHrine 0.3 mg/0.3 mL IJ SOAJ injection Inject 0.3 mg into the muscle as needed (for allergic reaction).   Yes [provider]  ibuprofen (ADVIL,MOTRIN) 200 MG tablet Take 800 mg by mouth every 6 (six) hours as needed for moderate pain.   Yes [provider]  Levonorgestrel (KYLEENA) 19.5 MG IUD Kyleena 17.5 mcg/24 hour (5 years) intrauterine device  Take 1 device by intrauterine route.   Yes [provider]  methylphenidate (DAYTRANA) 20 MG/9HR Place 2 patches onto the skin daily. wear patch for 9 hours only each day 11/28/16  Yes Dianne DunAron, Talia M, MD  montelukast (SINGULAIR) 10 MG tablet TAKE 1 TABLET AT BEDTIME Patient taking differently: TAKE 10mg  TABLET AT BEDTIME as needed for asthma 02/06/16  Yes Dianne DunAron, Talia M, MD  naproxen sodium (ANAPROX) 220 MG tablet Take 220 mg by mouth every 12 (twelve)  hours as needed (pain).   Yes [provider]  ondansetron (ZOFRAN ODT) 4 MG disintegrating tablet Take 1 tablet (4 mg total) by mouth every 8 (eight) hours as needed for nausea or vomiting. 12/24/16  Yes Focht, Jessica L, PA  PRESCRIPTION MEDICATION Inject into the muscle every 7 (seven) days. Allergy shot   Yes [provider]    . sodium chloride 100 mL/hr at 01/09/17 0514  . lactated ringers    . piperacillin-tazobactam (ZOSYN)  IV 3.375 g (01/09/17 0513)  . vancomycin     Anti-infectives (From admission, onward)   Start     Dose/Rate Route Frequency Ordered Stop   01/09/17 0600  vancomycin  (VANCOCIN) IVPB 1000 mg/200 mL premix     1,000 mg 200 mL/hr over 60 Minutes Intravenous Every 12 hours 01/08/17 2002     01/08/17 1700  vancomycin (VANCOCIN) IVPB 1000 mg/200 mL premix     1,000 mg 200 mL/hr over 60 Minutes Intravenous To Surgery 01/08/17 1646 01/08/17 1718   01/08/17 1400  piperacillin-tazobactam (ZOSYN) IVPB 3.375 g     3.375 g 12.5 mL/hr over 240 Minutes Intravenous Every 8 hours 01/08/17 1316        Assessment/Plan S/p I&D Right axillary hidradenitis 12/21/16  Dr. Marin Olphristopher White, 07/19/16 Dr. Abigail Miyamotoouglas Blackman I&D left axilla, 01/08/17, Dr. Carolynne Edouardoth ADHD Hx of Migraines FEN:  IV fluids/regular diet ID:  Vancomycin/Zosyn 11/6 =>> day 2 she is refusing Vancomycin - lost IV sites so switch to Augmentin. DVT:  Starting Lovenox Follow up:  [facility]     LOS: 0 days    Courtney [name] 01/09/2017 [phone]

## 2017-01-10 ENCOUNTER — Encounter (HOSPITAL_COMMUNITY): Payer: Self-pay | Admitting: General Practice

## 2017-01-10 ENCOUNTER — Other Ambulatory Visit: Payer: Self-pay | Admitting: Family Medicine

## 2017-01-10 ENCOUNTER — Other Ambulatory Visit: Payer: Self-pay

## 2017-01-10 DIAGNOSIS — L02412 Cutaneous abscess of left axilla: Secondary | ICD-10-CM | POA: Diagnosis not present

## 2017-01-10 MED ORDER — METHYLPHENIDATE 20 MG/9HR TD PTCH: 2.0000 | MEDICATED_PATCH | Freq: Every day | TRANSDERMAL | Status: DC

## 2017-01-10 MED ORDER — METHYLPHENIDATE 20 MG/9HR TD PTCH: 2.0000 | MEDICATED_PATCH | Freq: Every day | TRANSDERMAL | 0 refills | Status: DC

## 2017-01-10 NOTE — Telephone Encounter (Signed)
Needs MD approval. Thanks. 

## 2017-01-10 NOTE — Progress Notes (Signed)
2 Days Post-Op    CC:   Left Axillary infection  Subjective: No real change, still very tender.  We will get her up in the shower and do dressing change after she showers.    Objective: Vital signs in last 24 hours: Temp:  [97.7 F (36.5 C)-98.9 F (37.2 C)] 98.9 F (37.2 C) (11/08 0441) Pulse Rate:  [76-96] 91 (11/08 0441) Resp:  [18] 18 (11/08 0441) BP: (107-151)/(50-107) 107/50 (11/08 0441) SpO2:  [97 %-100 %] 97 % (11/08 0441) Last BM Date: 01/08/17 PO 354 No IV Urine 900 Afebrile, VSS No labs Specimen Description WOUND   Special Requests LEFT AXILLA PT ON VANC ZOSYN   Gram Stain ABUNDANT WBC PRESENT,BOTH PMN AND MONONUCLEAR  NO ORGANISMS SEEN     Culture NO GROWTH < 24 HOURS     Intake/Output from previous day: 11/07 0701 - 11/08 0700 In: 354 [P.O.:354] Out: 900 [Urine:900] Intake/Output this shift: No intake/output data recorded.  General appearance: alert, cooperative and no distress Skin: Skin color, texture, turgor normal. No rashes or lesions or no erythema, will get her into shower and then redress after shower.   Of the 3 open areas, one has already sealed.  The other two are clean, and look fine.  She has no pain tolerance for dressing change.  Even touching the sites cause significant pain and reaction.  Lab Results:  Recent Labs    01/08/17 1424  WBC 15.1*  HGB 11.8*  HCT 35.9*  PLT 399    BMET Recent Labs    01/08/17 1424  NA 138  K 4.2  CL 102  CO2 27  GLUCOSE 87  BUN 5*  CREATININE 0.63  CALCIUM 9.4   PT/INR No results for input(s): LABPROT, INR in the last 72 hours.  No results for input(s): AST, ALT, ALKPHOS, BILITOT, PROT, ALBUMIN in the last 168 hours.   Lipase  No results found for: LIPASE   Medications: . amoxicillin-clavulanate  1 tablet Oral Q12H  . enoxaparin (LOVENOX) injection  40 mg Subcutaneous Q24H  . methylphenidate  2 patch Transdermal Daily  . saccharomyces boulardii  250 mg Oral BID     Assessment/Plan I&D left axilla, 01/08/17, Dr. Carolynne Edouardoth POD 2 S/p I&D Right axillary hidradenitis 12/21/16  Dr. Marin Olphristopher White, 07/19/16 Dr. Abigail Miyamotoouglas Blackman ADHD Hx of Migraines Pain control FEN:  IV fluids/regular diet ID:  Vancomycin/Zosyn 11/6 -11/7;  refusd Vancomycin - lost IV sites so switch to Augmentin 01/09/17 =>> day 2 DVT:  Starting Lovenox Follow up:  [facility]  Plan: We will keep on doing dressing changes twice daily here until she can tolerate them at home.  Currently no organisms are growing from her open sites she was on clindamycin before admission.  Once we have a final on the cultures we may switch her back to clindamycin. Continue wet to dry, I told her she had to get the packing out next time.  We have changed to iodoform.        LOS: 0 days    Courtney [name] 01/10/2017 [phone]

## 2017-01-10 NOTE — Telephone Encounter (Signed)
Last Ov 12/06/2016. Last Rx  11/28/2016

## 2017-01-10 NOTE — Telephone Encounter (Signed)
Pt aware Rx is ready at the front/thx dmf 

## 2017-01-10 NOTE — Telephone Encounter (Signed)
Copied from CRM #[id number]. Topic: Quick Communication - See Telephone Encounter >> Jan 10, 2017  9:03 AM Guinevere FerrariMorris, Serine Kea E, NT wrote: CRM for notification. See Telephone encounter for: Pt. Mother is calling because her daughter is out of patches and wanted a refill. Medication is methylphenidate (DAYTRANA) 20 MG/9HR. [facility] [address] [facility] [place]  01/10/17.

## 2017-01-11 DIAGNOSIS — L02412 Cutaneous abscess of left axilla: Secondary | ICD-10-CM | POA: Diagnosis not present

## 2017-01-11 MED ORDER — HYDROMORPHONE HCL 1 MG/ML PO LIQD
1.5000 mg | Freq: Once | ORAL | Status: AC
  Administered 2017-01-11: 1.5 mg via ORAL

## 2017-01-11 NOTE — Progress Notes (Signed)
3 Days Post-Op    CC:  Axillary abscess  Subjective: We got the packing out and putting her back in the shower.  Will let nurse repack.  She can go when she and her mom can do dressing changes.  Objective: Vital signs in last 24 hours: Temp:  [97.3 F (36.3 C)-98.2 F (36.8 C)] 97.6 F (36.4 C) (11/09 0846) Pulse Rate:  [73-86] 83 (11/09 0846) Resp:  [18-20] 18 (11/09 0846) BP: (111-128)/(57-76) 111/65 (11/09 0846) SpO2:  [98 %-100 %] 98 % (11/09 0846) Last BM Date: 01/10/17 Specimen Description WOUND   Special Requests LEFT AXILLA PT ON VANC ZOSYN   Gram Stain ABUNDANT WBC PRESENT,BOTH PMN AND MONONUCLEAR  NO ORGANISMS SEEN     Culture NO GROWTH 3 DAYS NO ANAEROBES ISOLATED; CULTURE IN PROGRESS FOR 5 DAYS   Report Status PENDING     Intake/Output from previous day: 11/08 0701 - 11/09 0700 In: 1080 [P.O.:1080] Out: -  Intake/Output this shift: No intake/output data recorded.  General appearance: alert, cooperative and no distress Skin: Skin color, texture, turgor normal. No rashes or lesions or site is cleaning up pretty well, biggest issue is her pain tolerance.  Lab Results:  Recent Labs    01/08/17 1424  WBC 15.1*  HGB 11.8*  HCT 35.9*  PLT 399    BMET Recent Labs    01/08/17 1424  NA 138  K 4.2  CL 102  CO2 27  GLUCOSE 87  BUN 5*  CREATININE 0.63  CALCIUM 9.4   PT/INR No results for input(s): LABPROT, INR in the last 72 hours.  No results for input(s): AST, ALT, ALKPHOS, BILITOT, PROT, ALBUMIN in the last 168 hours.   Lipase  No results found for: LIPASE   Medications: . amoxicillin-clavulanate  1 tablet Oral Q12H  . enoxaparin (LOVENOX) injection  40 mg Subcutaneous Q24H  . methylphenidate  2 patch Transdermal Daily  . saccharomyces boulardii  250 mg Oral BID   Medications Discontinued During This Encounter  Medication Reason  . oxyCODONE (OXY IR/ROXICODONE) 5 MG immediate release tablet Completed Course  . bupivacaine-EPINEPHrine  (MARCAINE W/ EPI) 0.25% -1:200000 (with pres) injection Patient Discharge  . 0.9 % irrigation (POUR BTL) Patient Discharge  . fentaNYL (SUBLIMAZE) injection 25-50 mcg Patient Transfer  . ondansetron (ZOFRAN) injection 4 mg Patient Transfer  . oxyCODONE (Oxy IR/ROXICODONE) immediate release tablet 5 mg Patient Transfer  . oxyCODONE (ROXICODONE) 5 MG/5ML solution 5 mg Patient Transfer  . methylphenidate (DAYTRANA) 20 MG/9HR 2 patch Patient Preference  . 0.9 %  sodium chloride infusion   . piperacillin-tazobactam (ZOSYN) IVPB 3.375 g   . vancomycin (VANCOCIN) IVPB 1000 mg/200 mL premix   . HYDROmorphone (DILAUDID) injection 0.5-1.5 mg   . diphenhydrAMINE (BENADRYL) injection 25 mg    Anti-infectives (From admission, onward)   Start     Dose/Rate Route Frequency Ordered Stop   01/09/17 1630  amoxicillin-clavulanate (AUGMENTIN) 875-125 MG per tablet 1 tablet     1 tablet Oral Every 12 hours 01/09/17 1541     01/09/17 0600  vancomycin (VANCOCIN) IVPB 1000 mg/200 mL premix  Status:  Discontinued     1,000 mg 200 mL/hr over 60 Minutes Intravenous Every 12 hours 01/08/17 2002 01/09/17 1541   01/08/17 1700  vancomycin (VANCOCIN) IVPB 1000 mg/200 mL premix     1,000 mg 200 mL/hr over 60 Minutes Intravenous To Surgery 01/08/17 1646 01/08/17 1718   01/08/17 1400  piperacillin-tazobactam (ZOSYN) IVPB 3.375 g  Status:  Discontinued  3.375 g 12.5 mL/hr over 240 Minutes Intravenous Every 8 hours 01/08/17 1316 01/09/17 1541     Assessment/Plan  I&D left axilla, 01/08/17, Dr. Carolynne Edouardoth POD 3 S/p I&D Right axillary hidradenitis 12/21/16 Dr. Marin Olphristopher White, 07/19/16 Dr. Abigail Miyamotoouglas Blackman ADHD Hx of Migraines Pain control FEN: IV fluids/regular diet ID: Vancomycin/Zosyn 11/6 -11/7;  refusd Vancomycin - lost IV sites so switch to Augmentin  01/09/17 =>> day 3 DVT: Starting Lovenox Follow up:Sara Leeoth  Plan.  Shower with dressing out and BID dressing changes.  Home when she and her mom can handle  dressing changes.       LOS: 0 days    Courtney [name] 01/11/2017 [phone]

## 2017-01-12 DIAGNOSIS — L02412 Cutaneous abscess of left axilla: Secondary | ICD-10-CM | POA: Diagnosis not present

## 2017-01-12 MED ORDER — OXYCODONE HCL 5 MG PO TABS: 5.0000 mg | ORAL_TABLET | ORAL | 0 refills | Status: DC | PRN

## 2017-01-12 MED ORDER — AMOXICILLIN-POT CLAVULANATE 875-125 MG PO TABS: 1.0000 | ORAL_TABLET | Freq: Two times a day (BID) | ORAL | 0 refills | Status: DC

## 2017-01-12 MED ORDER — OXYCODONE HCL 5 MG PO TABS
5.0000 mg | ORAL_TABLET | ORAL | Status: DC | PRN
  Administered 2017-01-12: 10 mg via ORAL
  Filled 2017-01-12: qty 2

## 2017-01-12 NOTE — Progress Notes (Addendum)
Discharge instructions gone over with patient and family. Home medications discussed. Prescription given. Follow up appointment to be made. [facility] is following patient. Patient's mother is comfortable dong dressing changes. Patient verbalized understanding of instructions.

## 2017-01-12 NOTE — Progress Notes (Signed)
Patient ID: Courtney [name], female   DOB: Dec 08, 1990, 26 y.o.   MRN: [id number]  [facility]  4 Days Post-Op  Subjective: CC-  Mother at bedside. Still complaining of significant postop pain in axilla, although not taking very much pain medication. Wants to try oxycodone instead of dilaudid for dressing change today.  Objective: Vital signs in last 24 hours: Temp:  [97.6 F (36.4 C)-98.1 F (36.7 C)] 97.6 F (36.4 C) (11/10 0645) Pulse Rate:  [75-87] 87 (11/10 0645) Resp:  [18-19] 19 (11/09 2214) BP: (94-110)/(53-58) 97/58 (11/10 0645) SpO2:  [98 %-100 %] 98 % (11/10 0645) Last BM Date: 01/10/17  Intake/Output from previous day: 11/09 0701 - 11/10 0700 In: 1100 [P.O.:1100] Out: -  Intake/Output this shift: Total I/O In: 360 [P.O.:360] Out: -   PE: Gen:  Alert, NAD HEENT: EOM's intact, pupils equal and round Pulm:  effort normal Abd: Soft, NT/ND Psych: A&Ox3  Left axilla: axillary abscess s/p I&D with some packing still in place, no surrounding erythema or fluctuance, Right axilla: abscess s/p I&D, incisions healing well. Small area medial to incision erythematous, no fluctuance or drainage  Lab Results:  No results for input(s): WBC, HGB, HCT, PLT in the last 72 hours. BMET No results for input(s): NA, K, CL, CO2, GLUCOSE, BUN, CREATININE, CALCIUM in the last 72 hours. PT/INR No results for input(s): LABPROT, INR in the last 72 hours. CMP     Component Value Date/Time   NA 138 01/08/2017 1424   K 4.2 01/08/2017 1424   CL 102 01/08/2017 1424   CO2 27 01/08/2017 1424   GLUCOSE 87 01/08/2017 1424   BUN 5 (L) 01/08/2017 1424   CREATININE 0.63 01/08/2017 1424   CALCIUM 9.4 01/08/2017 1424   PROT 7.8 12/19/2016 2155   ALBUMIN 3.7 12/19/2016 2155   AST 18 12/19/2016 2155   ALT 13 (L) 12/19/2016 2155   ALKPHOS 118 12/19/2016 2155   BILITOT 0.8 12/19/2016 2155   GFRNONAA >60 01/08/2017 1424   GFRAA >60 01/08/2017 1424   Lipase  No  results found for: LIPASE     Studies/Results: No results found.  Anti-infectives: Anti-infectives (From admission, onward)   Start     Dose/Rate Route Frequency Ordered Stop   01/12/17 0000  amoxicillin-clavulanate (AUGMENTIN) 875-125 MG tablet     1 tablet Oral Every 12 hours 01/12/17 1033     01/09/17 1630  amoxicillin-clavulanate (AUGMENTIN) 875-125 MG per tablet 1 tablet     1 tablet Oral Every 12 hours 01/09/17 1541     01/09/17 0600  vancomycin (VANCOCIN) IVPB 1000 mg/200 mL premix  Status:  Discontinued     1,000 mg 200 mL/hr over 60 Minutes Intravenous Every 12 hours 01/08/17 2002 01/09/17 1541   01/08/17 1700  vancomycin (VANCOCIN) IVPB 1000 mg/200 mL premix     1,000 mg 200 mL/hr over 60 Minutes Intravenous To Surgery 01/08/17 1646 01/08/17 1718   01/08/17 1400  piperacillin-tazobactam (ZOSYN) IVPB 3.375 g  Status:  Discontinued     3.375 g 12.5 mL/hr over 240 Minutes Intravenous Every 8 hours 01/08/17 1316 01/09/17 1541       Assessment/Plan S/p I&D Right axillary hidradenitis 12/21/16 Dr. Marin Olphristopher White, 07/19/16 Dr. Abigail Miyamotoouglas Blackman ADHD Hx of Migraines  I&D left axilla, 01/08/17, Dr. Carolynne Edouardoth - POD 4 - culture NGTD, report pending - on augmentin - afebrile  ID - augmentin 11/7>>day#4 FEN - regular diet VTE - SCDs, lovenox Foley - none Follow up - Carolynne Edouardoth  Plan - Transition to PO oxycodone for dressing changes. Consult home health. If pain controlled on PO's and home health arranged, ok to discharge this afternoon. Augmentin and oxy rx's on chart. F/u 1 week Dr. Carolynne Edouardoth.   LOS: 0 days    Franne FortsBROOKE A Vela Render , [facility] 01/12/2017, 10:33 AM Pager: [phone] Consults: [phone] Mon-Fri 7:00 am-4:30 pm Sat-Sun 7:00 am-11:30 am

## 2017-01-12 NOTE — Discharge Instructions (Signed)
WOUND CARE: - dressing to be changed twice daily - supplies: sterile saline, packing strips, scissors, ABD pads, tape  - remove dressing and all packing carefully, moistening with sterile saline or in the shower as needed to avoid packing/internal dressing sticking to the wound. - dampen packing strips with sterile saline and pack wound from wound base to skin level, making sure to take note of any possible areas of wound tracking, tunneling and packing appropriately. Wound can be packed loosely. Cover wound with a dry ABD pad and secure with tape.  - change dressing as needed if leakage occurs, wound gets contaminated, or patient requests to shower. - patient may shower daily with wound open and following the shower the wound should be dried and a clean dressing placed.

## 2017-01-13 NOTE — Discharge Summary (Signed)
[facility] Discharge Summary   Patient ID: Courtney ApleyBrandy L Desir MRN: [id number] DOB/AGE: 03-May-1990 26 y.o.  Admit date: 01/08/2017 Discharge date: 01/12/2017  Admitting Diagnosis: LEFT AXILLARY ABSCESS  Discharge Diagnosis Patient Active Problem List   Diagnosis Date Noted  . Axillary abscess 12/20/2016  . Well woman exam 12/06/2016  . Vitamin D deficiency 10/18/2014  . S/P tonsillectomy 09/14/2013  . ADHD (attention deficit hyperactivity disorder) 02/11/2012    Consultants None  Imaging: No results found.  Procedures Dr. Carolynne Edouardoth (01/08/17) - IRRIGATION AND DEBRIDEMENT AXILLARY ABSCESS   Hospital Course:  Courtney [name] is a 26yo female who was [facility]6 from outpatient general surgery office with left axillary abscess. Patient has a h/o hidradenitis and right axillary abscess s/p I&D 07/20/16 and 12/21/16.  She had been to the ED 01/01/17 complaining of left axillary pain and swelling; she was discharged on clindamycin and clindagel with outpatient general surgery follow up. In clinic she was having worsening left axillary pain and swelling and she was admitted to the hospital for I&D in the OR. Tolerated procedure well and was transferred to the floor.  Initially given vancomycin and zosyn then she was transitioned to augmentin. On POD4 the patient was voiding well, tolerating diet, ambulating well, pain well controlled, vital signs stable and felt stable for discharge home.  Patient will follow up in our office in 1-2 weeks and knows to call with questions or concerns.  She will go home with a prescription for augmentin.    Allergies as of 01/12/2017      Reactions   Iodine Nausea Only   Shellfish Allergy Anaphylaxis   Tape Hives   Pt can only tolerate cloth tape      Medication List    STOP taking these medications   clindamycin 1 % gel Commonly known as:  CLINDAGEL   clindamycin 150 MG capsule Commonly known as:  CLEOCIN     TAKE these  medications   acetaminophen 500 MG tablet Commonly known as:  TYLENOL 1000 mg every 8 hours till you do not need pain medicine to move your arm around and use it.  You can buy this over the counter without a prescription.   amoxicillin-clavulanate 875-125 MG tablet Commonly known as:  AUGMENTIN Take 1 tablet every 12 (twelve) hours by mouth.   EPINEPHrine 0.3 mg/0.3 mL Soaj injection Commonly known as:  EPI-PEN Inject 0.3 mg into the muscle as needed (for allergic reaction).   ibuprofen 200 MG tablet Commonly known as:  ADVIL,MOTRIN Take 800 mg by mouth every 6 (six) hours as needed for moderate pain.   KYLEENA 19.5 MG Iud Generic drug:  Levonorgestrel Kyleena 17.5 mcg/24 hour (5 years) intrauterine device  Take 1 device by intrauterine route.   methylphenidate 20 MG/9HR Commonly known as:  DAYTRANA Place 2 patches daily onto the skin. wear patch for 9 hours only each day   montelukast 10 MG tablet Commonly known as:  SINGULAIR TAKE 1 TABLET AT BEDTIME What changed:    how much to take  how to take this  when to take this   naproxen sodium 220 MG tablet Commonly known as:  ALEVE Take 220 mg by mouth every 12 (twelve) hours as needed (pain).   ondansetron 4 MG disintegrating tablet Commonly known as:  ZOFRAN ODT Take 1 tablet (4 mg total) by mouth every 8 (eight) hours as needed for nausea or vomiting.   oxyCODONE 5 MG immediate release tablet Commonly known as:  Oxy IR/ROXICODONE Take 1 tablet (5 mg total) every 4 (four) hours as needed by mouth for severe pain.   PRESCRIPTION MEDICATION Inject into the muscle every 7 (seven) days. Allergy shot        Follow-up Information    Chevis Prettyoth, Paul III, MD. Call in 1 week(s).   Specialty:  General Surgery Why:  Please call as soon as you leave the hospital to make a follow up appointment regarding your recent surgery Contact information: [address] [place] [address] [phone]            Signed: Franne FortsBROOKE A MEUTH, [facility] 01/13/2017, 11:23 AM Pager: [phone] Consults: [phone] Mon-Fri 7:00 am-4:30 pm Sat-Sun 7:00 am-11:30 am

## 2017-01-14 DIAGNOSIS — L732 Hidradenitis suppurativa: Secondary | ICD-10-CM | POA: Diagnosis not present

## 2017-01-17 ENCOUNTER — Ambulatory Visit: Payer: 59 | Admitting: Primary Care

## 2017-01-17 LAB — AEROBIC/ANAEROBIC CULTURE W GRAM STAIN (SURGICAL/DEEP WOUND)

## 2017-01-20 ENCOUNTER — Ambulatory Visit (HOSPITAL_COMMUNITY)
Admission: EM | Admit: 2017-01-20 | Discharge: 2017-01-20 | Disposition: A | Discharge status: Home / Self Care | Payer: 59 | Attending: Family Medicine | Admitting: Family Medicine

## 2017-01-20 ENCOUNTER — Other Ambulatory Visit: Payer: Self-pay

## 2017-01-20 ENCOUNTER — Encounter (HOSPITAL_COMMUNITY): Payer: Self-pay | Admitting: *Deleted

## 2017-01-20 DIAGNOSIS — H10021 Other mucopurulent conjunctivitis, right eye: Secondary | ICD-10-CM

## 2017-01-20 MED ORDER — TETRACAINE HCL 0.5 % OP SOLN
OPHTHALMIC | Status: AC
  Filled 2017-01-20: qty 4

## 2017-01-20 MED ORDER — CIPROFLOXACIN HCL 0.3 % OP OINT: TOPICAL_OINTMENT | OPHTHALMIC | 0 refills | Status: DC

## 2017-01-20 NOTE — Discharge Instructions (Signed)
He is to ointment as directed, warm compresses and read instructions regarding eye infection. If worse or not getting better return or follow-up with your care doctor

## 2017-01-20 NOTE — ED Provider Notes (Signed)
[facility]    CSN: [id number] Arrival date & time: 01/20/17  1223     History   Chief Complaint Chief Complaint  Patient presents with  . Eye Pain    HPI Courtney [name] is a 26 y.o. female.   26 year old female complaining of right eye discomfort with erythema, irritation and thick gooey reddish-brown discharge. She had been wearing contacts but for the past 2 or 3 days she has been wearing glasses.      Past Medical History:  Diagnosis Date  . ADHD (attention deficit hyperactivity disorder)   . Chronic tonsillitis 08/2013   current strep, will finish antibiotic 09/04/2013; snores during sleep, mother denies apnea  . Heart murmur    "when I was born; outgrew it; I was a preemie"  . Hidradenitis   . Migraine 12/19/2016   "I've just had this one for 3 days" (12/20/2016)    Patient Active Problem List   Diagnosis Date Noted  . Axillary abscess 12/20/2016  . Well woman exam 12/06/2016  . Vitamin D deficiency 10/18/2014  . S/P tonsillectomy 09/14/2013  . ADHD (attention deficit hyperactivity disorder) 02/11/2012    Past Surgical History:  Procedure Laterality Date  . AXILLARY HIDRADENITIS EXCISION    . BILATERAL TONSILLECTOMY Bilateral 09/14/2013   Performed by Serena Colonel, MD at [facility]  . INCISION AND DRAINAGE ABSCESS right axilla Right 07/20/2016   Performed by Abigail Miyamoto, MD at [facility] OR  . INTRAUTERINE DEVICE (IUD) INSERTION  11/2016   "had the one in my left arm removed"  . IRRIGATION AND DEBRIDEMENT AXILLARY ABSCESS Left 01/08/2017   Performed by Griselda Miner, MD at [facility] OR  . IRRIGATION AND DEBRIDEMENT RIGHT AXILLARY HIDRADENITIS Right 12/21/2016   Performed by Andria Meuse, MD at [facility] OR  . TONSILLECTOMY    . TYMPANOPLASTY Bilateral    "rebuilt eardrums"  . WISDOM TOOTH EXTRACTION      OB History    No data available       Home Medications    Prior to Admission medications   Medication Sig Start Date End  Date Taking? Authorizing Provider  ALBUTEROL SULFATE HFA IN Inhale into the lungs.   Yes [provider]  amoxicillin-clavulanate (AUGMENTIN) 875-125 MG tablet Take 1 tablet every 12 (twelve) hours by mouth. 01/12/17  Yes Meuth, Brooke A, PA-C  ibuprofen (ADVIL,MOTRIN) 200 MG tablet Take 800 mg by mouth every 6 (six) hours as needed for moderate pain.   Yes [provider]  Levonorgestrel (KYLEENA) 19.5 MG IUD Kyleena 17.5 mcg/24 hour (5 years) intrauterine device  Take 1 device by intrauterine route.   Yes [provider]  methylphenidate (DAYTRANA) 20 MG/9HR Place 2 patches daily onto the skin. wear patch for 9 hours only each day 01/10/17  Yes Dianne Dun, MD  montelukast (SINGULAIR) 10 MG tablet TAKE 1 TABLET AT BEDTIME Patient taking differently: TAKE 10mg  TABLET AT BEDTIME as needed for asthma 02/06/16  Yes Dianne Dun, MD  naproxen sodium (ANAPROX) 220 MG tablet Take 220 mg by mouth every 12 (twelve) hours as needed (pain).   Yes [provider]  ondansetron (ZOFRAN ODT) 4 MG disintegrating tablet Take 1 tablet (4 mg total) by mouth every 8 (eight) hours as needed for nausea or vomiting. 12/24/16  Yes Focht, Jessica L, PA  oxyCODONE (OXY IR/ROXICODONE) 5 MG immediate release tablet Take 1 tablet (5 mg total) every 4 (four) hours as needed by mouth for severe  pain. 01/12/17  Yes Meuth, Brooke A, PA-C  PRESCRIPTION MEDICATION Inject into the muscle every 7 (seven) days. Allergy shot   Yes [provider]  acetaminophen (TYLENOL) 500 MG tablet 1000 mg every 8 hours till you do not need pain medicine to move your arm around and use it.  You can buy this over the counter without a prescription. 07/24/16   Sherrie GeorgeJennings, Willard, PA-C  ciprofloxacin (CILOXAN) 0.3 % ophthalmic ointment Apply 1/2 ribbon of ointment to right lower eye lid TID x 5 d 01/20/17   Hayden RasmussenMabe, Anaiyah Anglemyer, NP  EPINEPHrine 0.3 mg/0.3 mL IJ SOAJ injection Inject 0.3 mg into the muscle as needed (for  allergic reaction).    [provider]    Family History Family History  Problem Relation Age of Onset  . Hypertension Mother   . Hyperlipidemia Mother     Social History Social History   Tobacco Use  . Smoking status: Never Smoker  . Smokeless tobacco: Never Used  Substance Use Topics  . Alcohol use: Yes    Comment: occasional  . Drug use: No     Allergies   Iodine; Shellfish allergy; and Tape   Review of Systems Review of Systems  HENT: Negative.   Eyes: Positive for pain, discharge, redness and itching.       Mild blurriness.  All other systems reviewed and are negative.    Physical Exam Triage Vital Signs ED Triage Vitals [01/20/17 1324]  Enc Vitals Group     BP (!) 146/87     Pulse Rate 86     Resp 16     Temp 97.8 F (36.6 C)     Temp Source Oral     SpO2 99 %     Weight      Height      Head Circumference      Peak Flow      Pain Score 8     Pain Loc      Pain Edu?      Excl. in GC?    No data found.  Updated Vital Signs BP (!) 146/87 (BP Location: Right Leg)   Pulse 86   Temp 97.8 F (36.6 C) (Oral)   Resp 16   SpO2 99%   Visual Acuity Right Eye Distance: 20/200 with correction Left Eye Distance: 20/70 with correction Bilateral Distance: 20/50 with correction  Right Eye Near:   Left Eye Near:    Bilateral Near:     Physical Exam  Constitutional: She is oriented to person, place, and time. She appears well-developed and well-nourished. No distress.  Eyes: EOM are normal. Pupils are equal, round, and reactive to light.  Right upper and lower conjunctiva erythematous and mildly swollen. Minor injection of the sclera. The anterior chamber is clear. Small amount of thick discharge in the medial canthus. No lesions are seen within the upper or lower eyelids.  Neck: Normal range of motion. Neck supple.  Cardiovascular: Normal rate.  Pulmonary/Chest: Effort normal. No respiratory distress.  Musculoskeletal: She exhibits no  edema.  Neurological: She is alert and oriented to person, place, and time. She exhibits normal muscle tone.  Skin: Skin is warm and dry.  Psychiatric: She has a normal mood and affect.  Nursing note and vitals reviewed.    UC Treatments / Results  Labs (all labs ordered are listed, but only abnormal results are displayed) Labs Reviewed - No data to display  EKG  EKG Interpretation None  Radiology No results found.  Procedures Procedures (including critical care time)  Medications Ordered in UC Medications - No data to display   Initial Impression / Assessment and Plan / UC Course  I have reviewed the triage vital signs and the nursing notes.  Pertinent labs & imaging results that were available during my care of the patient were reviewed by me and considered in my medical decision making (see chart for details).    He is to ointment as directed, warm compresses and read instructions regarding eye infection. If worse or not getting better return or follow-up with your care doctor     Final Clinical Impressions(s) / UC Diagnoses   Final diagnoses:  Other mucopurulent conjunctivitis of right eye    ED Discharge Orders        Ordered    ciprofloxacin (CILOXAN) 0.3 % ophthalmic ointment     01/20/17 1341       Controlled Substance Prescriptions [place] Sub[place] Registry consulted? Not Applicable   Hayden RasmussenMabe, Deshay Blumenfeld, NP 01/20/17 1346

## 2017-01-20 NOTE — ED Triage Notes (Signed)
C/O right eye redness, pain, swelling, purulent drainage over past week since having hydradenitis surgery 1 wk ago.  C/O decreased vision in right eye.  Right pupil slightly larger than left, but pt did instill allergy eye drops PTA.

## 2017-01-21 ENCOUNTER — Encounter: Payer: Self-pay | Admitting: Nurse Practitioner

## 2017-01-21 ENCOUNTER — Ambulatory Visit (INDEPENDENT_AMBULATORY_CARE_PROVIDER_SITE_OTHER): Payer: 59 | Admitting: Nurse Practitioner

## 2017-01-21 VITALS — BP 100/70 | HR 90 | Temp 97.4°F | Ht 61.0 in | Wt 173.4 lb

## 2017-01-21 DIAGNOSIS — J029 Acute pharyngitis, unspecified: Secondary | ICD-10-CM | POA: Diagnosis not present

## 2017-01-21 DIAGNOSIS — R5383 Other fatigue: Secondary | ICD-10-CM | POA: Diagnosis not present

## 2017-01-21 DIAGNOSIS — R5381 Other malaise: Secondary | ICD-10-CM | POA: Diagnosis not present

## 2017-01-21 LAB — CBC WITH DIFFERENTIAL/PLATELET
BASOS ABS: 0.1 10*3/uL (ref 0.0–0.1)
BASOS PCT: 1.1 % (ref 0.0–3.0)
EOS ABS: 0.3 10*3/uL (ref 0.0–0.7)
Eosinophils Relative: 3.7 % (ref 0.0–5.0)
HCT: 37.1 % (ref 36.0–46.0)
Hemoglobin: 12.3 g/dL (ref 12.0–15.0)
LYMPHS ABS: 3.3 10*3/uL (ref 0.7–4.0)
Lymphocytes Relative: 38.5 % (ref 12.0–46.0)
MCHC: 33.2 g/dL (ref 30.0–36.0)
MCV: 88.7 fl (ref 78.0–100.0)
MONO ABS: 0.5 10*3/uL (ref 0.1–1.0)
Monocytes Relative: 6.4 % (ref 3.0–12.0)
NEUTROS ABS: 4.3 10*3/uL (ref 1.4–7.7)
NEUTROS PCT: 50.3 % (ref 43.0–77.0)
PLATELETS: 383 10*3/uL (ref 150.0–400.0)
RBC: 4.18 Mil/uL (ref 3.87–5.11)
RDW: 13.4 % (ref 11.5–15.5)
WBC: 8.5 10*3/uL (ref 4.0–10.5)

## 2017-01-21 LAB — COMPREHENSIVE METABOLIC PANEL
ALT: 13 U/L (ref 0–35)
AST: 15 U/L (ref 0–37)
Albumin: 3.8 g/dL (ref 3.5–5.2)
Alkaline Phosphatase: 94 U/L (ref 39–117)
BUN: 8 mg/dL (ref 6–23)
CALCIUM: 9.4 mg/dL (ref 8.4–10.5)
CHLORIDE: 104 meq/L (ref 96–112)
CO2: 31 meq/L (ref 19–32)
CREATININE: 0.75 mg/dL (ref 0.40–1.20)
GFR: 100.81 mL/min (ref >60.00)
GLUCOSE: 73 mg/dL (ref 70–99)
Potassium: 3.6 mEq/L (ref 3.5–5.1)
SODIUM: 141 meq/L (ref 135–145)
Total Bilirubin: 0.2 mg/dL (ref 0.2–1.2)
Total Protein: 7.5 g/dL (ref 6.0–8.3)

## 2017-01-21 LAB — POCT RAPID STREP A (OFFICE): Rapid Strep A Screen: NEGATIVE

## 2017-01-21 NOTE — Progress Notes (Signed)
Subjective:  Patient ID: Courtney [name], female    DOB: 11/29/1990  Age: 26 y.o. MRN: [id number]  CC: Eye Problem and Fatigue   Conjunctivitis   The current episode started 2 days ago. The onset was sudden. The problem has been gradually improving. The symptoms are aggravated by activity, movement and light. Associated symptoms include decreased vision, eye itching, photophobia, congestion, rhinorrhea, sore throat, swollen glands, muscle aches, eye discharge, eye pain and eye redness. Pertinent negatives include no fever, no double vision, no nausea, no vomiting, no ear discharge, no ear pain, no headaches, no hearing loss, no mouth sores, no stridor, no neck pain, no neck stiffness, no cough, no wheezing and no rash. Associated symptoms comments: Increased fatigue and bodyache. The eye pain is mild. The right eye is affected. The eyelid exhibits redness and swelling. She has been eating and drinking normally. Urine output has been normal. The last void occurred less than 6 hours ago. Sick contacts: at hospital. Recently, medical care has been given at another facility. Services received include medications given.    Outpatient Medications Prior to Visit  Medication Sig Dispense Refill  . acetaminophen (TYLENOL) 500 MG tablet 1000 mg every 8 hours till you do not need pain medicine to move your arm around and use it.  You can buy this over the counter without a prescription. 30 tablet 0  . ALBUTEROL SULFATE HFA IN Inhale into the lungs.    Marland Kitchen. amoxicillin-clavulanate (AUGMENTIN) 875-125 MG tablet Take 1 tablet every 12 (twelve) hours by mouth. 20 tablet 0  . ciprofloxacin (CILOXAN) 0.3 % ophthalmic ointment Apply 1/2 ribbon of ointment to right lower eye lid TID x 5 d 3.5 g 0  . EPINEPHrine 0.3 mg/0.3 mL IJ SOAJ injection Inject 0.3 mg into the muscle as needed (for allergic reaction).    Marland Kitchen. ibuprofen (ADVIL,MOTRIN) 200 MG tablet Take 800 mg by mouth every 6 (six) hours as needed for moderate pain.     . Levonorgestrel (KYLEENA) 19.5 MG IUD Kyleena 17.5 mcg/24 hour (5 years) intrauterine device  Take 1 device by intrauterine route.    . methylphenidate (DAYTRANA) 20 MG/9HR Place 2 patches daily onto the skin. wear patch for 9 hours only each day 60 patch 0  . montelukast (SINGULAIR) 10 MG tablet TAKE 1 TABLET AT BEDTIME (Patient taking differently: TAKE 10mg  TABLET AT BEDTIME as needed for asthma) 90 tablet 2  . naproxen sodium (ANAPROX) 220 MG tablet Take 220 mg by mouth every 12 (twelve) hours as needed (pain).    . ondansetron (ZOFRAN ODT) 4 MG disintegrating tablet Take 1 tablet (4 mg total) by mouth every 8 (eight) hours as needed for nausea or vomiting. 20 tablet 0  . oxyCODONE (OXY IR/ROXICODONE) 5 MG immediate release tablet Take 1 tablet (5 mg total) every 4 (four) hours as needed by mouth for severe pain. 20 tablet 0  . PRESCRIPTION MEDICATION Inject into the muscle every 7 (seven) days. Allergy shot     No facility-administered medications prior to visit.     ROS See HPI  Objective:  BP 100/70   Pulse 90   Temp (!) 97.4 F (36.3 C) (Oral)   Ht 5\' 1"  (1.549 m)   Wt 173 lb 6 oz (78.6 kg)   SpO2 98%   BMI 32.76 kg/m   BP Readings from Last 3 Encounters:  01/21/17 100/70  01/20/17 (!) 146/87  01/12/17 (!) 97/58    Wt Readings from Last 3 Encounters:  01/21/17  173 lb 6 oz (78.6 kg)  01/08/17 170 lb (77.1 kg)  12/21/16 166 lb 14.2 oz (75.7 kg)    Physical Exam  Constitutional: She is oriented to person, place, and time. No distress.  HENT:  Right Ear: External ear and ear canal normal. No mastoid tenderness. Tympanic membrane is not erythematous. A middle ear effusion is present.  Left Ear: External ear and ear canal normal. No mastoid tenderness. Tympanic membrane is not erythematous. A middle ear effusion is present.  Nose: Mucosal edema present. No rhinorrhea. Right sinus exhibits no maxillary sinus tenderness and no frontal sinus tenderness. Left sinus  exhibits no maxillary sinus tenderness and no frontal sinus tenderness.  Mouth/Throat: No trismus in the jaw. Oropharyngeal exudate and posterior oropharyngeal erythema present.  Neck: Normal range of motion. Neck supple. No thyromegaly present.  Cardiovascular: Normal rate.  Pulmonary/Chest: Effort normal. No stridor.  Musculoskeletal: She exhibits no edema.  Lymphadenopathy:    She has cervical adenopathy.  Neurological: She is alert and oriented to person, place, and time.  Skin: No rash noted.  Vitals reviewed.   Lab Results  Component Value Date   WBC 8.5 01/21/2017   HGB 12.3 01/21/2017   HCT 37.1 01/21/2017   PLT 383.0 01/21/2017   GLUCOSE 73 01/21/2017   CHOL 176 12/03/2016   TRIG 118.0 12/03/2016   HDL 36.90 (L) 12/03/2016   LDLCALC 116 (H) 12/03/2016   ALT 13 01/21/2017   AST 15 01/21/2017   NA 141 01/21/2017   K 3.6 01/21/2017   CL 104 01/21/2017   CREATININE 0.75 01/21/2017   BUN 8 01/21/2017   CO2 31 01/21/2017   TSH 2.45 12/03/2016   INR 1.07 12/19/2016    No results found.  Assessment & Plan:   Courtney [name] was seen today for eye problem and fatigue.  Diagnoses and all orders for this visit:  Acute pharyngitis, unspecified etiology -     POCT rapid strep A -     Cancel: Monospot -     CBC w/Diff -     Comprehensive metabolic panel -     Mononucleosis screen  Malaise and fatigue -     POCT rapid strep A -     Cancel: Monospot -     CBC w/Diff -     Comprehensive metabolic panel -     Mononucleosis screen   I am having Courtney [name] maintain her montelukast, EPINEPHrine, PRESCRIPTION MEDICATION, acetaminophen, Levonorgestrel, naproxen sodium, ibuprofen, ondansetron, methylphenidate, oxyCODONE, amoxicillin-clavulanate, ALBUTEROL SULFATE HFA IN, and ciprofloxacin.  No orders of the defined types were placed in this encounter.   Follow-up: Return if symptoms worsen or fail to improve.  Alysia Pennaharlotte Dontae Minerva, NP

## 2017-01-21 NOTE — Patient Instructions (Addendum)
Negative for mononucleosis and strep.  Encourage adequate oral hydration.  Complete eye ointment and oral abx as prescribed.  Please make appt with ophthalmology for additional eye exam.

## 2017-01-22 ENCOUNTER — Encounter: Payer: Self-pay | Admitting: Nurse Practitioner

## 2017-01-22 DIAGNOSIS — L732 Hidradenitis suppurativa: Secondary | ICD-10-CM | POA: Diagnosis not present

## 2017-01-22 LAB — MONONUCLEOSIS SCREEN: Mono Screen: NEGATIVE

## 2017-01-23 DIAGNOSIS — L732 Hidradenitis suppurativa: Secondary | ICD-10-CM | POA: Diagnosis not present

## 2017-01-29 DIAGNOSIS — L732 Hidradenitis suppurativa: Secondary | ICD-10-CM | POA: Diagnosis not present

## 2017-02-01 DIAGNOSIS — L732 Hidradenitis suppurativa: Secondary | ICD-10-CM | POA: Diagnosis not present

## 2017-02-01 DIAGNOSIS — L723 Sebaceous cyst: Secondary | ICD-10-CM | POA: Diagnosis not present

## 2017-02-04 DIAGNOSIS — L732 Hidradenitis suppurativa: Secondary | ICD-10-CM | POA: Diagnosis not present

## 2017-02-08 ENCOUNTER — Ambulatory Visit: Payer: 59 | Admitting: Primary Care

## 2017-02-13 ENCOUNTER — Telehealth: Payer: Self-pay | Admitting: Family Medicine

## 2017-02-13 NOTE — Telephone Encounter (Signed)
Copied from CRM [phone]. Topic: Quick Communication - Rx Refill/Question >> Feb 13, 2017  9:45 AM Landry MellowFoltz, Melissa J wrote: Has the patient contacted their pharmacy? No. Has to pick up    (Agent: If no, request that the patient contact the pharmacy for the refill.)   Preferred Pharmacy (with phone number or street name): daytrona patch 20mg . Pick up cb number is [phone]   Agent: Please be advised that RX refills may take up to 3 business days. We ask that you follow-up with your pharmacy.

## 2017-02-14 ENCOUNTER — Telehealth: Payer: Self-pay

## 2017-02-14 MED ORDER — METHYLPHENIDATE 20 MG/9HR TD PTCH: 2.0000 | MEDICATED_PATCH | Freq: Every day | TRANSDERMAL | 0 refills | Status: DC

## 2017-02-14 NOTE — Telephone Encounter (Signed)
Forms were faxed/thx dmf

## 2017-02-14 NOTE — Telephone Encounter (Signed)
LMOVM that Rx's for 3 months of Daytrana are ready to P/U/gave [place] address/thx dmf

## 2017-02-14 NOTE — Telephone Encounter (Signed)
Request for Daytrana Patch 20 mg/9 hrs; this is a pt of Dr Dayton MartesAron

## 2017-02-14 NOTE — Telephone Encounter (Signed)
LMOVM for pt asking if she is willing to come to [place] to P/U Daytrana patches/printed Rx's for Dec, Jan, Feb with note that OV is needed for late Feb/will call again when TA signs/thx dmf

## 2017-02-14 NOTE — Telephone Encounter (Signed)
Copied from CRM [id number]. Topic: Quick Communication - See Telephone Encounter >> Feb 14, 2017 10:26 AM Herby AbrahamJohnson, Shiquita C wrote: CRM for notification. See Telephone encounter for: [facility] - [facility] called in to make provider aware that due to the weather pt would like to postpone home visit for this week, they will return next weeks.   CB: if needed, [phone]  02/14/17.

## 2017-02-18 DIAGNOSIS — H60321 Hemorrhagic otitis externa, right ear: Secondary | ICD-10-CM | POA: Diagnosis not present

## 2017-02-18 DIAGNOSIS — H60331 Swimmer's ear, right ear: Secondary | ICD-10-CM | POA: Diagnosis not present

## 2017-02-20 ENCOUNTER — Telehealth: Payer: Self-pay | Admitting: Family Medicine

## 2017-02-20 DIAGNOSIS — L732 Hidradenitis suppurativa: Secondary | ICD-10-CM | POA: Diagnosis not present

## 2017-02-20 NOTE — Telephone Encounter (Signed)
Plz see request for order below for in home wound care monitoring once a week/plz advise/thx dmf

## 2017-02-20 NOTE — Telephone Encounter (Signed)
Copied from CRM [phone]. Topic: Quick Communication - See Telephone Encounter >> Feb 20, 2017 10:52 AM Diana EvesHoyt, Maryann B wrote: CRM for notification. See Telephone encounter for:  [name] with [facility] needing an order for re certification 1 x a week for wound care/ monitoring [phone] 02/20/17.

## 2017-02-22 ENCOUNTER — Telehealth: Payer: Self-pay

## 2017-02-22 DIAGNOSIS — L732 Hidradenitis suppurativa: Secondary | ICD-10-CM | POA: Diagnosis not present

## 2017-02-22 NOTE — Telephone Encounter (Signed)
See previous encounter/I have already taken care of this per TA/thx dmf  Copied from CRM [id number]. Topic: Quick Communication - See Telephone Encounter >> Feb 20, 2017 10:52 AM Diana EvesHoyt, Maryann B wrote: CRM for notification. See Telephone encounter for:  [name] with [facility] needing an order for re certification 1 x a week for wound care/ monitoring [phone] 02/20/17. >> Feb 22, 2017  9:46 AM Gerrianne ScalePayne, Angela L wrote: Lamar LaundrySonya [facility] [phone]  calling for verbal orders for pt to get treatment once a week to monitor wounds >> Feb 22, 2017 10:02 AM Lerry LinerFrederick, Vibhav Waddill M, CMA wrote: Junius Argyleesolved/see encounter/thx dmf

## 2017-02-22 NOTE — Telephone Encounter (Signed)
I called Sonja and gave verbal orders per TA/thx dmf

## 2017-02-22 NOTE — Telephone Encounter (Signed)
Okay to give verbal order as requested.  Thank you!

## 2017-03-01 DIAGNOSIS — L732 Hidradenitis suppurativa: Secondary | ICD-10-CM | POA: Diagnosis not present

## 2017-03-06 DIAGNOSIS — L732 Hidradenitis suppurativa: Secondary | ICD-10-CM | POA: Diagnosis not present

## 2017-03-13 DIAGNOSIS — L732 Hidradenitis suppurativa: Secondary | ICD-10-CM | POA: Diagnosis not present

## 2017-03-15 ENCOUNTER — Telehealth: Payer: Self-pay

## 2017-03-15 NOTE — Telephone Encounter (Signed)
TA-plz see note below/thx dmf  Copied from CRM [id number]. Topic: General - Other >> Mar 14, 2017 10:13 AM Crist InfanteHarrald, Kathy J wrote: Reason for CRM: Mortimer FriesSonya King [facility] due to pt's insurance and copays. Pt has decided she does not want their services

## 2017-03-15 NOTE — Telephone Encounter (Signed)
Noted! Thank you

## 2017-03-28 ENCOUNTER — Ambulatory Visit: Payer: Self-pay | Admitting: General Surgery

## 2017-04-02 ENCOUNTER — Encounter (HOSPITAL_COMMUNITY): Payer: Self-pay | Admitting: *Deleted

## 2017-04-02 ENCOUNTER — Other Ambulatory Visit: Payer: Self-pay

## 2017-04-02 NOTE — Progress Notes (Signed)
Pt denies SOB, chest pain, and being under the care of a cardiologist. Pt denies having a stress test, echo and cardiac cath. Pt denies recent labs. Pt made aware to stop taking Aspirin, vitamins, fish oil and herbal medications. Do not take any NSAIDs ie: Ibuprofen, Advil, Naproxen (Aleve), Motrin, BC and Goody Powder or any medication containing Aspirin. Pt verbalized understanding of all pre-op instructions.

## 2017-04-03 ENCOUNTER — Observation Stay (HOSPITAL_COMMUNITY)
Admission: AD | Admit: 2017-04-03 | Discharge: 2017-04-05 | Disposition: A | Discharge status: Home / Self Care | Payer: 59 | Source: Ambulatory Visit | Attending: General Surgery | Admitting: General Surgery

## 2017-04-03 ENCOUNTER — Other Ambulatory Visit: Payer: Self-pay | Admitting: Family Medicine

## 2017-04-03 ENCOUNTER — Ambulatory Visit (HOSPITAL_COMMUNITY): Payer: 59 | Admitting: Certified Registered"

## 2017-04-03 ENCOUNTER — Encounter (HOSPITAL_COMMUNITY)
Admission: AD | Disposition: A | Discharge status: Home / Self Care | Payer: Self-pay | Source: Ambulatory Visit | Attending: General Surgery

## 2017-04-03 ENCOUNTER — Encounter (HOSPITAL_COMMUNITY): Payer: Self-pay | Admitting: Urology

## 2017-04-03 DIAGNOSIS — L732 Hidradenitis suppurativa: Principal | ICD-10-CM | POA: Diagnosis present

## 2017-04-03 DIAGNOSIS — L02411 Cutaneous abscess of right axilla: Secondary | ICD-10-CM | POA: Diagnosis not present

## 2017-04-03 DIAGNOSIS — L02412 Cutaneous abscess of left axilla: Secondary | ICD-10-CM | POA: Diagnosis not present

## 2017-04-03 DIAGNOSIS — L02419 Cutaneous abscess of limb, unspecified: Secondary | ICD-10-CM | POA: Diagnosis not present

## 2017-04-03 HISTORY — PX: HYDRADENITIS EXCISION: SHX5243

## 2017-04-03 HISTORY — DX: Family history of other specified conditions: Z84.89

## 2017-04-03 LAB — CBC
HCT: 38.8 % (ref 36.0–46.0)
Hemoglobin: 12.8 g/dL (ref 12.0–15.0)
MCH: 29.1 pg (ref 26.0–34.0)
MCHC: 33 g/dL (ref 30.0–36.0)
MCV: 88.2 fL (ref 78.0–100.0)
PLATELETS: 353 10*3/uL (ref 150–400)
RBC: 4.4 MIL/uL (ref 3.87–5.11)
RDW: 13.7 % (ref 11.5–15.5)
WBC: 10.8 10*3/uL — ABNORMAL HIGH (ref 4.0–10.5)

## 2017-04-03 LAB — POCT PREGNANCY, URINE: PREG TEST UR: NEGATIVE

## 2017-04-03 SURGERY — EXCISION, HIDRADENITIS, AXILLA
Anesthesia: General | Site: Axilla | Laterality: Bilateral

## 2017-04-03 MED ORDER — CHLORHEXIDINE GLUCONATE CLOTH 2 % EX PADS: 6.0000 | MEDICATED_PAD | Freq: Once | CUTANEOUS | Status: DC

## 2017-04-03 MED ORDER — FENTANYL CITRATE (PF) 100 MCG/2ML IJ SOLN
INTRAMUSCULAR | Status: DC | PRN
  Administered 2017-04-03 (×5): 50 ug via INTRAVENOUS

## 2017-04-03 MED ORDER — LIDOCAINE 2% (20 MG/ML) 5 ML SYRINGE
INTRAMUSCULAR | Status: AC
  Filled 2017-04-03: qty 5

## 2017-04-03 MED ORDER — ACETAMINOPHEN 500 MG PO TABS
1000.0000 mg | ORAL_TABLET | ORAL | Status: AC
  Administered 2017-04-03: 500 mg via ORAL

## 2017-04-03 MED ORDER — ONDANSETRON HCL 4 MG/2ML IJ SOLN
4.0000 mg | Freq: Four times a day (QID) | INTRAMUSCULAR | Status: DC | PRN
Start: 2017-04-03 — End: 2017-04-05

## 2017-04-03 MED ORDER — GABAPENTIN 300 MG PO CAPS
300.0000 mg | ORAL_CAPSULE | ORAL | Status: AC
  Administered 2017-04-03: 300 mg via ORAL

## 2017-04-03 MED ORDER — CEFAZOLIN SODIUM-DEXTROSE 2-4 GM/100ML-% IV SOLN
INTRAVENOUS | Status: AC
  Filled 2017-04-03: qty 100

## 2017-04-03 MED ORDER — HYDROMORPHONE HCL 1 MG/ML IJ SOLN
0.5000 mg | INTRAMUSCULAR | Status: DC | PRN
  Administered 2017-04-04: 1 mg via INTRAVENOUS
  Filled 2017-04-03: qty 1

## 2017-04-03 MED ORDER — LACTATED RINGERS IV SOLN
INTRAVENOUS | Status: DC
  Administered 2017-04-03: 13:00:00 via INTRAVENOUS

## 2017-04-03 MED ORDER — 0.9 % SODIUM CHLORIDE (POUR BTL) OPTIME
TOPICAL | Status: DC | PRN
  Administered 2017-04-03: 1000 mL

## 2017-04-03 MED ORDER — ACETAMINOPHEN 10 MG/ML IV SOLN: 1000.0000 mg | Freq: Once | INTRAVENOUS | Status: DC | PRN

## 2017-04-03 MED ORDER — CELECOXIB 200 MG PO CAPS
ORAL_CAPSULE | ORAL | Status: AC
  Administered 2017-04-03: 200 mg via ORAL
  Filled 2017-04-03: qty 1

## 2017-04-03 MED ORDER — LIDOCAINE 2% (20 MG/ML) 5 ML SYRINGE
INTRAMUSCULAR | Status: DC | PRN
  Administered 2017-04-03: 100 mg via INTRAVENOUS

## 2017-04-03 MED ORDER — HYDROCODONE-ACETAMINOPHEN 7.5-325 MG PO TABS: 1.0000 | ORAL_TABLET | Freq: Once | ORAL | Status: DC | PRN

## 2017-04-03 MED ORDER — BUPIVACAINE-EPINEPHRINE (PF) 0.5% -1:200000 IJ SOLN
INTRAMUSCULAR | Status: AC
Start: 2017-04-03 — End: 2017-04-03
  Filled 2017-04-03: qty 30

## 2017-04-03 MED ORDER — OXYCODONE-ACETAMINOPHEN 5-325 MG PO TABS
1.0000 | ORAL_TABLET | ORAL | Status: DC | PRN
  Administered 2017-04-04 – 2017-04-05 (×4): 2 via ORAL
  Filled 2017-04-03 (×4): qty 2

## 2017-04-03 MED ORDER — HEPARIN SODIUM (PORCINE) 5000 UNIT/ML IJ SOLN
5000.0000 [IU] | Freq: Three times a day (TID) | INTRAMUSCULAR | Status: DC
  Administered 2017-04-04 – 2017-04-05 (×4): 5000 [IU] via SUBCUTANEOUS
  Filled 2017-04-03 (×5): qty 1

## 2017-04-03 MED ORDER — FENTANYL CITRATE (PF) 250 MCG/5ML IJ SOLN
INTRAMUSCULAR | Status: AC
  Filled 2017-04-03: qty 5

## 2017-04-03 MED ORDER — BUPIVACAINE-EPINEPHRINE (PF) 0.5% -1:200000 IJ SOLN
INTRAMUSCULAR | Status: DC | PRN
Start: 2017-04-03 — End: 2017-04-03
  Administered 2017-04-03: 10 mL via PERINEURAL

## 2017-04-03 MED ORDER — PROPOFOL 10 MG/ML IV BOLUS
INTRAVENOUS | Status: AC
  Filled 2017-04-03: qty 20

## 2017-04-03 MED ORDER — ADALIMUMAB 40 MG/0.4ML ~~LOC~~ PSKT: 40.0000 mg | PREFILLED_SYRINGE | SUBCUTANEOUS | Status: DC

## 2017-04-03 MED ORDER — MEPERIDINE HCL 25 MG/ML IJ SOLN: 6.2500 mg | INTRAMUSCULAR | Status: DC | PRN

## 2017-04-03 MED ORDER — PHENYLEPHRINE 40 MCG/ML (10ML) SYRINGE FOR IV PUSH (FOR BLOOD PRESSURE SUPPORT)
PREFILLED_SYRINGE | INTRAVENOUS | Status: DC | PRN
  Administered 2017-04-03 (×3): 80 ug via INTRAVENOUS

## 2017-04-03 MED ORDER — PROMETHAZINE HCL 25 MG/ML IJ SOLN: 6.2500 mg | INTRAMUSCULAR | Status: DC | PRN

## 2017-04-03 MED ORDER — ONDANSETRON HCL 4 MG/2ML IJ SOLN
INTRAMUSCULAR | Status: DC | PRN
  Administered 2017-04-03: 4 mg via INTRAVENOUS

## 2017-04-03 MED ORDER — KCL IN DEXTROSE-NACL 20-5-0.9 MEQ/L-%-% IV SOLN
INTRAVENOUS | Status: DC
  Administered 2017-04-03: 21:00:00 via INTRAVENOUS
  Filled 2017-04-03: qty 1000

## 2017-04-03 MED ORDER — HYDROMORPHONE HCL 1 MG/ML IJ SOLN: 0.2500 mg | INTRAMUSCULAR | Status: DC | PRN

## 2017-04-03 MED ORDER — DEXAMETHASONE SODIUM PHOSPHATE 10 MG/ML IJ SOLN
INTRAMUSCULAR | Status: DC | PRN
  Administered 2017-04-03: 10 mg via INTRAVENOUS

## 2017-04-03 MED ORDER — SCOPOLAMINE 1 MG/3DAYS TD PT72
1.0000 | MEDICATED_PATCH | TRANSDERMAL | Status: DC
  Administered 2017-04-03: 1.5 mg via TRANSDERMAL

## 2017-04-03 MED ORDER — GABAPENTIN 300 MG PO CAPS
ORAL_CAPSULE | ORAL | Status: AC
  Administered 2017-04-03: 300 mg via ORAL
  Filled 2017-04-03: qty 1

## 2017-04-03 MED ORDER — MONTELUKAST SODIUM 10 MG PO TABS
10.0000 mg | ORAL_TABLET | Freq: Every day | ORAL | Status: DC
  Administered 2017-04-04: 10 mg via ORAL
  Filled 2017-04-03 (×2): qty 1

## 2017-04-03 MED ORDER — MIDAZOLAM HCL 2 MG/2ML IJ SOLN
INTRAMUSCULAR | Status: AC
  Filled 2017-04-03: qty 2

## 2017-04-03 MED ORDER — PROPOFOL 10 MG/ML IV BOLUS
INTRAVENOUS | Status: DC | PRN
  Administered 2017-04-03: 20 mg via INTRAVENOUS
  Administered 2017-04-03: 150 mg via INTRAVENOUS
  Administered 2017-04-03: 30 mg via INTRAVENOUS

## 2017-04-03 MED ORDER — CEFAZOLIN SODIUM-DEXTROSE 2-4 GM/100ML-% IV SOLN
2.0000 g | INTRAVENOUS | Status: AC
  Administered 2017-04-03: 2 g via INTRAVENOUS

## 2017-04-03 MED ORDER — SCOPOLAMINE 1 MG/3DAYS TD PT72
MEDICATED_PATCH | TRANSDERMAL | Status: AC
  Filled 2017-04-03: qty 1

## 2017-04-03 MED ORDER — ACETAMINOPHEN 500 MG PO TABS
ORAL_TABLET | ORAL | Status: AC
  Administered 2017-04-03: 500 mg via ORAL
  Filled 2017-04-03: qty 2

## 2017-04-03 MED ORDER — CELECOXIB 200 MG PO CAPS
200.0000 mg | ORAL_CAPSULE | ORAL | Status: AC
  Administered 2017-04-03: 200 mg via ORAL

## 2017-04-03 MED ORDER — MIDAZOLAM HCL 5 MG/5ML IJ SOLN
INTRAMUSCULAR | Status: DC | PRN
  Administered 2017-04-03: 2 mg via INTRAVENOUS

## 2017-04-03 MED ORDER — ONDANSETRON 4 MG PO TBDP
4.0000 mg | ORAL_TABLET | Freq: Four times a day (QID) | ORAL | Status: DC | PRN
  Filled 2017-04-03: qty 1

## 2017-04-03 MED ORDER — PANTOPRAZOLE SODIUM 40 MG IV SOLR
40.0000 mg | Freq: Every day | INTRAVENOUS | Status: DC
  Filled 2017-04-03: qty 40

## 2017-04-03 MED ORDER — DEXTROSE 5 % IV SOLN
15.0000 mg/kg/d | Freq: Three times a day (TID) | INTRAVENOUS | Status: DC
Start: 2017-04-03 — End: 2017-04-05
  Administered 2017-04-03 – 2017-04-05 (×5): 390.4 mg via INTRAVENOUS
  Filled 2017-04-03 (×7): qty 24.4

## 2017-04-03 SURGICAL SUPPLY — 46 items
BENZOIN TINCTURE PRP APPL 2/3 (GAUZE/BANDAGES/DRESSINGS) ×3 IMPLANT
BLADE SURG 15 STRL LF DISP TIS (BLADE) ×1 IMPLANT
BLADE SURG 15 STRL SS (BLADE) ×2
BNDG GAUZE ELAST 4 BULKY (GAUZE/BANDAGES/DRESSINGS) IMPLANT
CLOSURE WOUND 1/4X4 (GAUZE/BANDAGES/DRESSINGS) ×1
COVER SURGICAL LIGHT HANDLE (MISCELLANEOUS) ×3 IMPLANT
DECANTER SPIKE VIAL GLASS SM (MISCELLANEOUS) ×3 IMPLANT
DRAPE ORTHO SPLIT 77X108 STRL (DRAPES) ×4
DRAPE SURG ORHT 6 SPLT 77X108 (DRAPES) ×2 IMPLANT
DRAPE UTILITY XL STRL (DRAPES) ×6 IMPLANT
ELECT CAUTERY BLADE 6.4 (BLADE) ×6 IMPLANT
ELECT REM PT RETURN 9FT ADLT (ELECTROSURGICAL) ×3
ELECTRODE REM PT RTRN 9FT ADLT (ELECTROSURGICAL) ×1 IMPLANT
GAUZE SPONGE 2X2 8PLY STRL LF (GAUZE/BANDAGES/DRESSINGS) ×2 IMPLANT
GAUZE SPONGE 4X4 12PLY STRL (GAUZE/BANDAGES/DRESSINGS) ×6 IMPLANT
GLOVE BIO SURGEON STRL SZ7.5 (GLOVE) ×3 IMPLANT
GLOVE BIOGEL PI IND STRL 6 (GLOVE) ×1 IMPLANT
GLOVE BIOGEL PI IND STRL 6.5 (GLOVE) ×1 IMPLANT
GLOVE BIOGEL PI IND STRL 7.0 (GLOVE) ×1 IMPLANT
GLOVE BIOGEL PI INDICATOR 6 (GLOVE) ×2
GLOVE BIOGEL PI INDICATOR 6.5 (GLOVE) ×2
GLOVE BIOGEL PI INDICATOR 7.0 (GLOVE) ×2
GOWN STRL REUS W/ TWL LRG LVL3 (GOWN DISPOSABLE) ×2 IMPLANT
GOWN STRL REUS W/TWL LRG LVL3 (GOWN DISPOSABLE) ×4
KIT BASIN OR (CUSTOM PROCEDURE TRAY) ×3 IMPLANT
KIT ROOM TURNOVER OR (KITS) ×3 IMPLANT
NEEDLE HYPO 25GX1X1/2 BEV (NEEDLE) ×3 IMPLANT
NS IRRIG 1000ML POUR BTL (IV SOLUTION) ×3 IMPLANT
PACK SURGICAL SETUP 50X90 (CUSTOM PROCEDURE TRAY) ×3 IMPLANT
PAD ARMBOARD 7.5X6 YLW CONV (MISCELLANEOUS) ×3 IMPLANT
PENCIL BUTTON HOLSTER BLD 10FT (ELECTRODE) ×3 IMPLANT
SCRUB PCMX 4 OZ (MISCELLANEOUS) ×3 IMPLANT
SPONGE GAUZE 2X2 STER 10/PKG (GAUZE/BANDAGES/DRESSINGS) ×4
SPONGE LAP 18X18 X RAY DECT (DISPOSABLE) ×6 IMPLANT
STRIP CLOSURE SKIN 1/4X4 (GAUZE/BANDAGES/DRESSINGS) ×2 IMPLANT
SUT ETHILON 3 0 FSL (SUTURE) ×6 IMPLANT
SUT MNCRL AB 4-0 PS2 18 (SUTURE) ×6 IMPLANT
SUT VIC AB 3-0 SH 27 (SUTURE) ×4
SUT VIC AB 3-0 SH 27XBRD (SUTURE) ×2 IMPLANT
SYR BULB 3OZ (MISCELLANEOUS) ×3 IMPLANT
SYR CONTROL 10ML LL (SYRINGE) ×3 IMPLANT
TOWEL OR 17X24 6PK STRL BLUE (TOWEL DISPOSABLE) ×3 IMPLANT
TOWEL OR 17X26 10 PK STRL BLUE (TOWEL DISPOSABLE) ×3 IMPLANT
TUBE CONNECTING 12'X1/4 (SUCTIONS) ×1
TUBE CONNECTING 12X1/4 (SUCTIONS) ×2 IMPLANT
YANKAUER SUCT BULB TIP NO VENT (SUCTIONS) ×3 IMPLANT

## 2017-04-03 NOTE — Interval H&P Note (Signed)
History and Physical Interval Note:  04/03/2017 1:51 PM  Courtney [name]  has presented today for surgery, with the diagnosis of bilaterial axillary hidradenitis  The various methods of treatment have been discussed with the patient and family. After consideration of risks, benefits and other options for treatment, the patient has consented to  Procedure(s): INCISION AND DRAINAGE BILATERAL  HIDRADENITIS AXILLA (Bilateral) as a surgical intervention .  The patient's history has been reviewed, patient examined, no change in status, stable for surgery.  I have reviewed the patient's chart and labs.  Questions were answered to the patient's satisfaction.     TOTH III,Vivianna Piccini S

## 2017-04-03 NOTE — Op Note (Signed)
04/03/2017  3:36 PM  PATIENT:  Courtney [name]  27 y.o. female  PRE-OPERATIVE DIAGNOSIS:  bilaterial axillary hidradenitis  POST-OPERATIVE DIAGNOSIS:  bilaterial axillary hidradenitis  PROCEDURE:  Procedure(s): INCISION AND DRAINAGE BILATERAL  HIDRADENITIS AXILLA (Bilateral)  SURGEON:  Surgeon(s) and Role:    * Griselda Mineroth, Johney Perotti III, MD - Primary  PHYSICIAN ASSISTANT:   ASSISTANTS: none   ANESTHESIA:   general  EBL:  100 mL   BLOOD ADMINISTERED:none  DRAINS: none   LOCAL MEDICATIONS USED:  NONE  SPECIMEN:  No Specimen  DISPOSITION OF SPECIMEN:  N/A  COUNTS:  YES  TOURNIQUET:  * No tourniquets in log *  DICTATION: .Dragon Dictation   After informed consent was obtained the patient was brought to the operating room and placed in the supine position on the operating table.  After adequate induction of general anesthesia the patient's bilateral chest, breast, and axillary area were prepped with technicare and draped in the usual sterile manner.  The patient had multiple areas that were draining in the left axilla.  These were all probed with hemostats and many of these areas connected.  The entire cavities were opened sharply with electrocautery.  Hemostasis was achieved using the Bovie electrocautery.  Once the wounds were opened and hemostatic then they were packed with 4 x 4 gauze and sterile dressings were applied.  This was all isolated to the skin and subcutaneous tissue.  In the right axilla there was a single open area that tracked laterally.  This was opened sharply through the skin and subcutaneous tissue with the electrocautery.  Hemostasis was achieved using the Bovie electrocautery.  Once the wound was hemostatic and clean the area was packed with a 4 x 4 gauze and dressings were applied.  The patient tolerated the procedure well.  At the end of the case all needle sponge and instrument counts were correct.  The patient was then awakened and taken to recovery in stable  condition.  PLAN OF CARE: Admit to inpatient   PATIENT DISPOSITION:  PACU - hemodynamically stable.   Delay start of Pharmacological VTE agent (>24hrs) due to surgical blood loss or risk of bleeding: no

## 2017-04-03 NOTE — Progress Notes (Signed)
ANTIBIOTIC CONSULT NOTE - INITIAL  Pharmacy Consult for Septra Indication: wound infection  Allergies  Allergen Reactions  . Shellfish Allergy Anaphylaxis  . Tape Hives and Other (See Comments)    Pt can only tolerate cloth tape  . Morphine And Related     " I become very angry"  . Other     ABD pad causes rash  . Iodine Nausea Only  . Vancomycin Other (See Comments)    Burns her vein really bad and always causes them to blow.    Patient Measurements: Weight: 172 lb (78 kg) Adjusted Body Weight:    Vital Signs: Temp: 98 F (36.7 C) (01/30 1848) Temp Source: Oral (01/30 1848) BP: 80/62 (01/30 1848) Pulse Rate: 76 (01/30 1848) Intake/Output from previous day: No intake/output data recorded. Intake/Output from this shift: Total I/O In: 900 [I.V.:900] Out: 100 [Blood:100]  Labs: Recent Labs    04/03/17 1245  WBC 10.8*  HGB 12.8  PLT 353   CrCl cannot be calculated (Patient's most recent lab result is older than the maximum 21 days allowed.). No results for input(s): VANCOTROUGH, VANCOPEAK, VANCORANDOM, GENTTROUGH, GENTPEAK, GENTRANDOM, TOBRATROUGH, TOBRAPEAK, TOBRARND, AMIKACINPEAK, AMIKACINTROU, AMIKACIN in the last 72 hours.   Microbiology: No results found for this or any previous visit (from the past 720 hour(s)).  Medical History: Past Medical History:  Diagnosis Date  . ADHD (attention deficit hyperactivity disorder)   . Chronic tonsillitis 08/2013   current strep, will finish antibiotic 09/04/2013; snores during sleep, mother denies apnea  . Family history of adverse reaction to anesthesia    " My cousin had problems waking up. "  . Heart murmur    "when I was born; outgrew it; I was a preemie"  . Hidradenitis   . Migraine 12/19/2016   "I've just had this one for 3 days" (12/20/2016)   Assessment: CC/HPI: several weeks status post incision and drainage of a left axillary abscess. Some improvement for continues to have significant pain and now with  drainage. Patient with immunosuppression on Humira.  Significant events: 1/30 I&D B axilla for hidradenitis  ID: 1/30 B axilla hidradenitis s/p I&D. Last Scr 0.75. WBC 10.8. Immunosuppressed.  Goal of Therapy:  Eradication of infection  Plan:  BMET in AM Vancomycin "allergy" per patient Dr. Fredricka Bonineonnor ok to change to Septra per pharmacy Septra IV 5mg /kg/8hr = 390mg  q 8 hrs.   Jagger Beahm S. Merilynn Finlandobertson, PharmD, BCPS Clinical Staff Pharmacist Pager [phone]  Misty Stanleyobertson, Mariam Helbert Stillinger 04/03/2017,7:00 PM

## 2017-04-03 NOTE — Progress Notes (Signed)
Received Pt on bed at 1835, Pt not in distress, VS taken and recorded, Pt denied pain, endorsed accordingly.

## 2017-04-03 NOTE — Anesthesia Postprocedure Evaluation (Signed)
Anesthesia Post Note  Patient: Courtney [name]  Procedure(s) Performed: EXCISION AND DRAINAGE BILATERAL  HIDRADENITIS AXILLA (Bilateral Axilla)     Anesthesia Post Evaluation  Last Vitals:  Vitals:   04/03/17 1545 04/03/17 1600  BP: 126/82 117/81  Pulse: 94 78  Resp: 16 14  Temp: (!) 36.3 C   SpO2: 100% 94%    Last Pain:  Vitals:   04/03/17 1600  TempSrc:   PainSc: Asleep                 Trevor IhaStephen A Rachit Grim

## 2017-04-03 NOTE — Anesthesia Preprocedure Evaluation (Signed)
Anesthesia Evaluation  Patient identified by MRN, date of birth, ID band Patient awake    Reviewed: Allergy & Precautions, NPO status , Patient's Chart, lab work & pertinent test results  Airway Mallampati: II  TM Distance: >3 FB Neck ROM: Full    Dental no notable dental hx.    Pulmonary neg pulmonary ROS,    Pulmonary exam normal breath sounds clear to auscultation       Cardiovascular negative cardio ROS Normal cardiovascular exam Rhythm:Regular Rate:Normal     Neuro/Psych negative neurological ROS  negative psych ROS   GI/Hepatic negative GI ROS, Neg liver ROS,   Endo/Other  negative endocrine ROS  Renal/GU negative Renal ROS  negative genitourinary   Musculoskeletal negative musculoskeletal ROS (+)   Abdominal   Peds  Hematology negative hematology ROS (+)   Anesthesia Other Findings   Reproductive/Obstetrics negative OB ROS                             Anesthesia Physical Anesthesia Plan  ASA: II  Anesthesia Plan: General   Post-op Pain Management:    Induction: Intravenous  PONV Risk Score and Plan: Treatment may vary due to age or medical condition, Ondansetron, Dexamethasone, Scopolamine patch - Pre-op and Midazolam  Airway Management Planned: LMA  Additional Equipment:   Intra-op Plan:   Post-operative Plan:   Informed Consent: I have reviewed the patients History and Physical, chart, labs and discussed the procedure including the risks, benefits and alternatives for the proposed anesthesia with the patient or authorized representative who has indicated his/her understanding and acceptance.   Dental advisory given  Plan Discussed with: CRNA and Anesthesiologist  Anesthesia Plan Comments:         Anesthesia Quick Evaluation

## 2017-04-03 NOTE — Anesthesia Procedure Notes (Signed)
Procedure Name: LMA Insertion Date/Time: 04/03/2017 2:24 PM Performed by: Marny Lowensteinapozzi, Zachery Niswander W, CRNA Pre-anesthesia Checklist: Patient identified, Emergency Drugs available, Suction available, Timeout performed and Patient being monitored Patient Re-evaluated:Patient Re-evaluated prior to induction Oxygen Delivery Method: Circle system utilized Preoxygenation: Pre-oxygenation with 100% oxygen Induction Type: IV induction Ventilation: Mask ventilation without difficulty LMA: LMA inserted LMA Size: 4.0 Number of attempts: 1 Placement Confirmation: positive ETCO2,  CO2 detector and breath sounds checked- equal and bilateral Tube secured with: Tape Dental Injury: Teeth and Oropharynx as per pre-operative assessment

## 2017-04-03 NOTE — H&P (Signed)
Courtney [name]  Location: [facility] Surgery Patient #: [id number] DOB: Jun 16, 1990 Single / Language: Courtney [name] / Race: White Female   History of Present Illness  The patient is a 27 year old female who presents for a follow-up for Subcutaneous abscess. The patient is a 27 year old white female who is several weeks status post incision and drainage of a left axillary abscess. She has made some gradual improvement but still has significant pain in both axillas. She now has draining areas in both axillas. She denies any fevers or chills.   Medication History  Neomycin-Polymyxin-Dexameth (3.5-10000-0.1 Suspension, Ophthalmic) Active. Ondansetron (4MG Tablet Disint, Oral) Active. Albuterol Sulfate HFA (108 (90 Base)MCG/ACT Aerosol Soln, Inhalation) Active. Naproxen Sodium (220MG Tablet, Oral) Active. Montelukast Sodium (10MG Tablet, Oral) Active. Humira Pen (40MG/0.4ML Pen-inj Kit, Subcutaneous) Active. Clindamycin Phosphate (1% Gel, External) Active. Daytrana (20MG/9HR Patch, Transdermal) Active. Tylenol (500MG Capsule, Oral) Active. EPINEPHrine (0.3MG/0.3ML Device, Injection) Active. Singulair (10MG Tablet, Oral) Active. Mupirocin (2% Ointment, External) Active. Medications Reconciled    Review of Systems  General Not Present- Appetite Loss, Chills, Fatigue, Fever, Night Sweats, Weight Gain and Weight Loss. Skin Not Present- Change in Wart/Mole, Dryness, Hives, Jaundice, New Lesions, Non-Healing Wounds, Rash and Ulcer. HEENT Present- Seasonal Allergies and Wears glasses/contact lenses. Not Present- Earache, Hearing Loss, Hoarseness, Nose Bleed, Oral Ulcers, Ringing in the Ears, Sinus Pain, Sore Throat, Visual Disturbances and Yellow Eyes. Respiratory Not Present- Bloody sputum, Chronic Cough, Difficulty Breathing, Snoring and Wheezing. Breast Not Present- Breast Mass, Breast Pain, Nipple Discharge and Skin Changes. Cardiovascular Not Present- Chest Pain, Difficulty  Breathing Lying Down, Leg Cramps, Palpitations, Rapid Heart Rate, Shortness of Breath and Swelling of Extremities. Gastrointestinal Not Present- Abdominal Pain, Bloating, Bloody Stool, Change in Bowel Habits, Chronic diarrhea, Constipation, Difficulty Swallowing, Excessive gas, Gets full quickly at meals, Hemorrhoids, Indigestion, Nausea, Rectal Pain and Vomiting. Female Genitourinary Not Present- Frequency, Nocturia, Painful Urination, Pelvic Pain and Urgency. Musculoskeletal Not Present- Back Pain, Joint Pain, Joint Stiffness, Muscle Pain, Muscle Weakness and Swelling of Extremities. Neurological Not Present- Decreased Memory, Fainting, Headaches, Numbness, Seizures, Tingling, Tremor, Trouble walking and Weakness. Psychiatric Not Present- Anxiety, Bipolar, Change in Sleep Pattern, Depression, Fearful and Frequent crying. Endocrine Not Present- Cold Intolerance, Excessive Hunger, Hair Changes, Heat Intolerance, Hot flashes and New Diabetes. Hematology Not Present- Blood Thinners, Easy Bruising, Excessive bleeding, Gland problems, HIV and Persistent Infections.  Vitals  Weight: 180 lb Height: 61in Body Surface Area: 1.81 m Body Mass Index: 34.01 kg/m  Temp.: 97.1F  Pulse: 120 (Regular)  P.OX: 98% (Room air) BP: 120/80 (Sitting, Left Arm, Standard)       Physical Exam  General Mental Status-Alert. General Appearance-Consistent with stated age. Hydration-Well hydrated. Voice-Normal.  Integumentary Note: The wound in the left axilla is healing reasonably well. There is a new draining area along the midportion of the incision. There is also a new draining pocket of pus in the low right axilla.   Head and Neck Head-normocephalic, atraumatic with no lesions or palpable masses. Trachea-midline. Thyroid Gland Characteristics - normal size and consistency.  Eye Eyeball - Bilateral-Extraocular movements intact. Sclera/Conjunctiva - Bilateral-No scleral  icterus.  Chest and Lung Exam Chest and lung exam reveals -quiet, even and easy respiratory effort with no use of accessory muscles and on auscultation, normal breath sounds, no adventitious sounds and normal vocal resonance. Inspection Chest Wall - Normal. Back - normal.  Cardiovascular Cardiovascular examination reveals -normal heart sounds, regular rate and rhythm with no murmurs and normal pedal pulses bilaterally.  Abdomen Inspection Inspection of the abdomen reveals - No Hernias. Skin - Scar - no surgical scars. Palpation/Percussion Palpation and Percussion of the abdomen reveal - Soft, Non Tender, No Rebound tenderness, No Rigidity (guarding) and No hepatosplenomegaly. Auscultation Auscultation of the abdomen reveals - Bowel sounds normal.  Neurologic Neurologic evaluation reveals -alert and oriented x 3 with no impairment of recent or remote memory. Mental Status-Normal.  Musculoskeletal Normal Exam - Left-Upper Extremity Strength Normal and Lower Extremity Strength Normal. Normal Exam - Right-Upper Extremity Strength Normal and Lower Extremity Strength Normal.  Lymphatic Head & Neck  General Head & Neck Lymphatics: Bilateral - Description - Normal. Axillary  General Axillary Region: Bilateral - Description - Normal. Tenderness - Non Tender. Femoral & Inguinal  Generalized Femoral & Inguinal Lymphatics: Bilateral - Description - Normal. Tenderness - Non Tender.    Assessment & Plan  AXILLARY ABSCESS (L02.419) Impression: The patient is several weeks status post incision and drainage of left axillary abscesses and hidradenitis. Although the wound is continuing to heal she now has 2 new areas. One is beneath the previous incision and drainage in the left axilla and the other is in the right axilla. I believe both require incision and drainage and I think this would be best done in the operating room given how uncomfortable she is. I have discussed with her  in detail the risks and benefits of the operation as well as some of the technical aspects and she understands and wishes to proceed. She also has an appointment with a hidradenitis specialist in [place] in the middle of February. Current Plans Follow up with [place] in the office in 2 months.  Call us sooner as needed.  Started Percocet 5-325 MG Oral Tablet, 1 (one) Tablet every six hours, as needed, #20, 03/21/2017, No Refill. Restarted Clindamycin HCl 300 MG Oral Capsule, 1 Capsule two times daily, #60, 03/21/2017, No Refill.

## 2017-04-03 NOTE — Transfer of Care (Signed)
Immediate Anesthesia Transfer of Care Note  Patient: Courtney [name]  Procedure(s) Performed: EXCISION AND DRAINAGE BILATERAL  HIDRADENITIS AXILLA (Bilateral Axilla)  Patient Location: PACU  Anesthesia Type:General  Level of Consciousness: awake, alert  and oriented  Airway & Oxygen Therapy: Patient Spontanous Breathing and Patient connected to face mask oxygen  Post-op Assessment: Report given to RN and Post -op Vital signs reviewed and stable  Post vital signs: Reviewed and stable  Last Vitals:  Vitals:   04/03/17 1235  BP: 106/67  Pulse: 74  Resp: 18  Temp: 36.6 C  SpO2: 99%    Last Pain:  Vitals:   04/03/17 1235  TempSrc: Oral      Patients Stated Pain Goal: 7 (04/03/17 1228)  Complications: No apparent anesthesia complications

## 2017-04-04 ENCOUNTER — Encounter (HOSPITAL_COMMUNITY): Payer: Self-pay | Admitting: General Surgery

## 2017-04-04 ENCOUNTER — Other Ambulatory Visit: Payer: Self-pay

## 2017-04-04 DIAGNOSIS — L732 Hidradenitis suppurativa: Secondary | ICD-10-CM | POA: Diagnosis not present

## 2017-04-04 LAB — BASIC METABOLIC PANEL
Anion gap: 10 (ref 5–15)
BUN: 6 mg/dL (ref 6–20)
CALCIUM: 9 mg/dL (ref 8.9–10.3)
CO2: 21 mmol/L — ABNORMAL LOW (ref 22–32)
CREATININE: 0.69 mg/dL (ref 0.44–1.00)
Chloride: 107 mmol/L (ref 101–111)
GFR calc Af Amer: >60 mL/min (ref >60)
Glucose, Bld: 129 mg/dL — ABNORMAL HIGH (ref 65–99)
Potassium: 4.2 mmol/L (ref 3.5–5.1)
SODIUM: 138 mmol/L (ref 135–145)

## 2017-04-04 MED ORDER — PANTOPRAZOLE SODIUM 40 MG PO TBEC
40.0000 mg | DELAYED_RELEASE_TABLET | Freq: Every day | ORAL | Status: DC
  Administered 2017-04-04: 40 mg via ORAL
  Filled 2017-04-04: qty 1

## 2017-04-04 NOTE — Progress Notes (Signed)
1 Day Post-Op   Subjective/Chief Complaint: Complains of pain   Objective: Vital signs in last 24 hours: Temp:  [97 F (36.1 C)-98.4 F (36.9 C)] 98.3 F (36.8 C) (01/31 0444) Pulse Rate:  [73-94] 94 (01/31 0444) Resp:  [12-20] 16 (01/31 0444) BP: (80-139)/(57-87) 139/67 (01/31 0444) SpO2:  [93 %-100 %] 99 % (01/31 0444) Weight:  [78 kg (172 lb)] 78 kg (172 lb) (01/30 1848) Last BM Date: 04/03/17  Intake/Output from previous day: 01/30 0701 - 01/31 0700 In: 900 [I.V.:900] Out: 100 [Blood:100] Intake/Output this shift: No intake/output data recorded.  General appearance: alert and cooperative Resp: clear to auscultation bilaterally Cardio: regular rate and rhythm GI: soft, non-tender; bowel sounds normal; no masses,  no organomegaly Skin: wounds clean and stable  Lab Results:  Recent Labs    04/03/17 1245  WBC 10.8*  HGB 12.8  HCT 38.8  PLT 353   BMET Recent Labs    04/04/17 0811  NA 138  K 4.2  CL 107  CO2 21*  GLUCOSE 129*  BUN 6  CREATININE 0.69  CALCIUM 9.0   PT/INR No results for input(s): LABPROT, INR in the last 72 hours. ABG No results for input(s): PHART, HCO3 in the last 72 hours.  Invalid input(s): PCO2, PO2  Studies/Results: No results found.  Anti-infectives: Anti-infectives (From admission, onward)   Start     Dose/Rate Route Frequency Ordered Stop   04/03/17 2000  sulfamethoxazole-trimethoprim (BACTRIM) 390.4 mg in dextrose 5 % 500 mL IVPB     15 mg/kg/day  78 kg 349.6 mL/hr over 90 Minutes Intravenous Every 8 hours 04/03/17 1859     04/03/17 1215  ceFAZolin (ANCEF) IVPB 2g/100 mL premix     2 g 200 mL/hr over 30 Minutes Intravenous To Surgery 04/03/17 1204 04/03/17 1455   04/03/17 1209  ceFAZolin (ANCEF) 2-4 GM/100ML-% IVPB    Comments:  Rosenberger, Meredit: cabinet override      04/03/17 1209 04/03/17 1425      Assessment/Plan: s/p Procedure(s): EXCISION AND DRAINAGE BILATERAL  HIDRADENITIS AXILLA (Bilateral) Advance  diet  Continue dressing changes. Will be able to go home when she can tolerate dressing changes with oral pain meds. Not there yet ambulate  LOS: 1 day    TOTH III,Geanine Vandekamp S 04/04/2017

## 2017-04-04 NOTE — Consult Note (Signed)
WOC nurse consulted for hidradenitis, patient underwent surgical incision and drainage per CCS, would request surgery team to write orders for wound care. Based on operative notes appears to be saline moist gauze dressings with dry dressing topper. I will update orders to reflect this.  Kayline Sheer [facility] MSN,RN,CWOCN, CNS, [employer] [phone]

## 2017-04-05 DIAGNOSIS — L732 Hidradenitis suppurativa: Secondary | ICD-10-CM | POA: Diagnosis not present

## 2017-04-05 MED ORDER — OXYCODONE-ACETAMINOPHEN 5-325 MG PO TABS: 1.0000 | ORAL_TABLET | Freq: Four times a day (QID) | ORAL | 0 refills | Status: DC | PRN

## 2017-04-05 NOTE — Plan of Care (Signed)
  Activity: Risk for activity intolerance will decrease 04/05/2017 1007 - Progressing by Darrow BussingArcilla, Satoya Feeley M, RN   Nutrition: Adequate nutrition will be maintained 04/05/2017 1007 - Progressing by Darrow BussingArcilla, Vivienne Sangiovanni M, RN   Elimination: Will not experience complications related to bowel motility 04/05/2017 1007 - Progressing by Darrow BussingArcilla, Ronn Smolinsky M, RN   Pain Managment: General experience of comfort will improve 04/05/2017 1007 - Progressing by Darrow BussingArcilla, Jadarrius Maselli M, RN   Safety: Ability to remain free from injury will improve 04/05/2017 1007 - Progressing by Darrow BussingArcilla, Meosha Castanon M, RN

## 2017-04-05 NOTE — Progress Notes (Signed)
Provided discharge education/instructions, all questions and concerns addressed, Pt not in distress, discharged home with belongings accompanied by her mother.

## 2017-04-05 NOTE — Progress Notes (Signed)
Patient would like her mother to be present for dressing changes.  She is refusing staff to do it at this time.

## 2017-04-05 NOTE — Progress Notes (Signed)
2 Days Post-Op   Subjective/Chief Complaint: Feels better today. Less pain   Objective: Vital signs in last 24 hours: Temp:  [98 F (36.7 C)-98.5 F (36.9 C)] 98 F (36.7 C) (02/01 0418) Pulse Rate:  [73-85] 85 (02/01 0418) Resp:  [16] 16 (02/01 0418) BP: (95-103)/(41-47) 95/45 (02/01 0418) SpO2:  [96 %-100 %] 98 % (02/01 0418) Last BM Date: 04/03/17  Intake/Output from previous day: 01/31 0701 - 02/01 0700 In: 480 [P.O.:480] Out: -  Intake/Output this shift: No intake/output data recorded.  General appearance: alert and cooperative Resp: clear to auscultation bilaterally Cardio: regular rate and rhythm GI: soft, non-tender; bowel sounds normal; no masses,  no organomegaly Skin: axillary wounds clean  Lab Results:  Recent Labs    04/03/17 1245  WBC 10.8*  HGB 12.8  HCT 38.8  PLT 353   BMET Recent Labs    04/04/17 0811  NA 138  K 4.2  CL 107  CO2 21*  GLUCOSE 129*  BUN 6  CREATININE 0.69  CALCIUM 9.0   PT/INR No results for input(s): LABPROT, INR in the last 72 hours. ABG No results for input(s): PHART, HCO3 in the last 72 hours.  Invalid input(s): PCO2, PO2  Studies/Results: No results found.  Anti-infectives: Anti-infectives (From admission, onward)   Start     Dose/Rate Route Frequency Ordered Stop   04/03/17 2000  sulfamethoxazole-trimethoprim (BACTRIM) 390.4 mg in dextrose 5 % 500 mL IVPB     15 mg/kg/day  78 kg 349.6 mL/hr over 90 Minutes Intravenous Every 8 hours 04/03/17 1859     04/03/17 1215  ceFAZolin (ANCEF) IVPB 2g/100 mL premix     2 g 200 mL/hr over 30 Minutes Intravenous To Surgery 04/03/17 1204 04/03/17 1455   04/03/17 1209  ceFAZolin (ANCEF) 2-4 GM/100ML-% IVPB    Comments:  Rosenberger, Meredit: cabinet override      04/03/17 1209 04/03/17 1425      Assessment/Plan: s/p Procedure(s): EXCISION AND DRAINAGE BILATERAL  HIDRADENITIS AXILLA (Bilateral) Advance diet  Continue dressing changes Home when able to tolerate  dressings with oral pain meds  LOS: 2 days    TOTH III,PAUL S 04/05/2017

## 2017-04-05 NOTE — Progress Notes (Signed)
PO pain med given prior to dressing change, Pt tolerated well.

## 2017-04-09 ENCOUNTER — Emergency Department (HOSPITAL_COMMUNITY): Payer: 59

## 2017-04-09 ENCOUNTER — Emergency Department (HOSPITAL_COMMUNITY)
Admission: EM | Admit: 2017-04-09 | Discharge: 2017-04-09 | Disposition: A | Discharge status: Home / Self Care | Payer: 59 | Attending: Emergency Medicine | Admitting: Emergency Medicine

## 2017-04-09 ENCOUNTER — Encounter (HOSPITAL_COMMUNITY): Payer: Self-pay | Admitting: Emergency Medicine

## 2017-04-09 ENCOUNTER — Emergency Department (HOSPITAL_BASED_OUTPATIENT_CLINIC_OR_DEPARTMENT_OTHER)
Admit: 2017-04-09 | Discharge: 2017-04-09 | Disposition: A | Discharge status: Home / Self Care | Payer: 59 | Attending: Emergency Medicine | Admitting: Emergency Medicine

## 2017-04-09 DIAGNOSIS — M7989 Other specified soft tissue disorders: Secondary | ICD-10-CM | POA: Diagnosis not present

## 2017-04-09 DIAGNOSIS — M79672 Pain in left foot: Secondary | ICD-10-CM | POA: Insufficient documentation

## 2017-04-09 DIAGNOSIS — I6789 Other cerebrovascular disease: Secondary | ICD-10-CM | POA: Diagnosis not present

## 2017-04-09 DIAGNOSIS — R200 Anesthesia of skin: Secondary | ICD-10-CM | POA: Diagnosis not present

## 2017-04-09 DIAGNOSIS — Z79899 Other long term (current) drug therapy: Secondary | ICD-10-CM | POA: Diagnosis not present

## 2017-04-09 DIAGNOSIS — M79609 Pain in unspecified limb: Secondary | ICD-10-CM | POA: Diagnosis not present

## 2017-04-09 DIAGNOSIS — M25572 Pain in left ankle and joints of left foot: Secondary | ICD-10-CM | POA: Diagnosis not present

## 2017-04-09 MED ORDER — IBUPROFEN 800 MG PO TABS: 800.0000 mg | ORAL_TABLET | Freq: Three times a day (TID) | ORAL | 0 refills | Status: DC | PRN

## 2017-04-09 NOTE — Discharge Instructions (Signed)
It was my pleasure taking care of you today!   Fortunately, your ultrasound showed no blood clots and your x-rays were negative.   Ice and elevate affected area for pain relief. Ibuprofen as needed for pain.   Call the orthopedic doctor listed to schedule a follow up appointment if symptoms are not improving by Friday morning.   Return to ER for new or worsening symptoms, any additional concerns.

## 2017-04-09 NOTE — ED Provider Notes (Signed)
[facility]-EMERGENCY DEPT Provider Note   CSN: [id number] Arrival date & time: 04/09/17  1531     History   Chief Complaint Chief Complaint  Patient presents with  . Foot Pain    HPI Courtney [name] is a 27 y.o. female.  The history is provided by the patient and medical records. No language interpreter was used.  Foot Pain    Courtney [name] is a 27 y.o. female  who presents to the Emergency Department complaining of acute onset of left foot pain which began this morning. Patient states that when she woke up, she had pain to the bottom of her foot which radiated up ankle and lower aspect of the left leg. 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 She underwent excision and drainage of bilateral axillary hidradenitis on January 30.  She is concerned that she may have developed a blood clot due to recent procedure.  Denies any lower extremity swelling.  She took a Percocet at home which provided mild relief.  Pain is worse with ambulation.  No back pain.   Past Medical History:  Diagnosis Date  . ADHD (attention deficit hyperactivity disorder)   . Chronic tonsillitis 08/2013   current strep, will finish antibiotic 09/04/2013; snores during sleep, mother denies apnea  . Family history of adverse reaction to anesthesia    " My cousin had problems waking up. "  . Heart murmur    "when I was born; outgrew it; I was a preemie"  . Hidradenitis   . Migraine 12/19/2016   "I've just had this one for 3 days" (12/20/2016)    Patient Active Problem List   Diagnosis Date Noted  . Axillary hidradenitis suppurativa 04/03/2017  . Axillary abscess 12/20/2016  . Well woman exam 12/06/2016  . Vitamin D deficiency 10/18/2014  . S/P tonsillectomy 09/14/2013  . ADHD (attention deficit hyperactivity disorder) 02/11/2012    Past Surgical History:  Procedure  Laterality Date  . AXILLARY HIDRADENITIS EXCISION    . HYDRADENITIS EXCISION Bilateral 04/03/2017   Procedure: EXCISION AND DRAINAGE BILATERAL  HIDRADENITIS AXILLA;  Surgeon: Griselda Mineroth, Paul III, MD;  [facility];  Service: General;  Laterality: Bilateral;  . INCISION AND DRAINAGE ABSCESS Right 07/20/2016   Procedure: INCISION AND DRAINAGE ABSCESS right axilla;  Surgeon: Abigail MiyamotoBlackman, Douglas, MD;  [facility];  Service: General;  Laterality: Right;  . INTRAUTERINE DEVICE (IUD) INSERTION  11/2016   "had the one in my left arm removed"  . IRRIGATION AND DEBRIDEMENT ABSCESS Right 12/21/2016   Procedure: IRRIGATION AND DEBRIDEMENT RIGHT AXILLARY HIDRADENITIS;  Surgeon: Andria MeuseWhite, Christopher M, MD;  Location: [facility] OR;  Service: General;  Laterality: Right;  . IRRIGATION AND DEBRIDEMENT ABSCESS Left 01/08/2017   Procedure: IRRIGATION AND DEBRIDEMENT AXILLARY ABSCESS;  Surgeon: Griselda Mineroth, Paul III, MD;  [facility];  Service: General;  Laterality: Left;  . TONSILLECTOMY Bilateral 09/14/2013   Procedure: BILATERAL TONSILLECTOMY;  Surgeon: Serena ColonelJefry Rosen, MD;  Location: [facility];  Service: ENT;  Laterality: Bilateral;  . TONSILLECTOMY    . TYMPANOPLASTY Bilateral    "rebuilt eardrums"  . WISDOM TOOTH EXTRACTION      OB History    No data available       Home Medications    Prior to Admission medications   Medication Sig Start Date End Date Taking? Authorizing Provider  acetaminophen (TYLENOL)  500 MG tablet 1000 mg every 8 hours till you do not need pain medicine to move your arm around and use it.  You can buy this over the counter without a prescription. Patient not taking: Reported on 03/25/2017 07/24/16   Sherrie George, PA-C  Adalimumab (HUMIRA) 40 MG/0.4ML PSKT Inject 40 mg into the skin every Saturday.    [provider]  ciprofloxacin (CILOXAN) 0.3 % ophthalmic ointment Apply 1/2 ribbon of ointment to right lower eye lid TID x 5 d Patient not taking: Reported on  03/25/2017 01/20/17   Hayden Rasmussen, NP  clindamycin (CLEOCIN) 300 MG capsule Take 300 mg by mouth 2 (two) times daily. 03/21/17   [provider]  clindamycin (CLINDAGEL) 1 % gel Apply 1 application topically daily as needed (for wound site).    [provider]  EPINEPHrine 0.3 mg/0.3 mL IJ SOAJ injection Inject 0.3 mg into the muscle as needed (for allergic reaction).    [provider]  ibuprofen (ADVIL,MOTRIN) 800 MG tablet Take 1 tablet (800 mg total) by mouth every 8 (eight) hours as needed. 04/09/17   Ward, Chase Picket, PA-C  Levonorgestrel [facility]) 19.5 MG IUD Kyleena 17.5 mcg/24 hour (5 years) intrauterine device  Take 1 device by intrauterine route.    [provider]  methylphenidate Lezlie Octave) 20 MG/9HR Place 2 patches onto the skin daily. wear patch for 9 hours only each day 02/14/17   Dianne Dun, MD  montelukast (SINGULAIR) 10 MG tablet TAKE 1 TABLET AT BEDTIME Patient taking differently: Take 10 mg by mouth at bedtime as needed for allergies 02/06/16   Dianne Dun, MD  ondansetron (ZOFRAN ODT) 4 MG disintegrating tablet Take 1 tablet (4 mg total) by mouth every 8 (eight) hours as needed for nausea or vomiting. 12/24/16   Focht, Joyce Copa, PA  oxyCODONE (OXY IR/ROXICODONE) 5 MG immediate release tablet Take 1 tablet (5 mg total) every 4 (four) hours as needed by mouth for severe pain. Patient not taking: Reported on 03/25/2017 01/12/17   Franne Forts, PA-C  oxyCODONE-acetaminophen (PERCOCET/ROXICET) 5-325 MG tablet Take 1 tablet by mouth every 6 (six) hours as needed for severe pain.    [provider]  oxyCODONE-acetaminophen (PERCOCET/ROXICET) 5-325 MG tablet Take 1-2 tablets by mouth every 6 (six) hours as needed for moderate pain. 04/05/17   Griselda Miner, MD  PRESCRIPTION MEDICATION Inject into the muscle every 7 (seven) days. Allergy shot    [provider]    Family History Family History  Problem Relation Age of Onset    . Hypertension Mother   . Hyperlipidemia Mother     Social History Social History   Tobacco Use  . Smoking status: Never Smoker  . Smokeless tobacco: Never Used  Substance Use Topics  . Alcohol use: Yes    Comment: occasional  . Drug use: No     Allergies   Shellfish allergy; Tape; Morphine and related; Other; Iodine; and Vancomycin   Review of Systems Review of Systems  Musculoskeletal: Positive for arthralgias and myalgias. Negative for back pain.  All other systems reviewed and are negative.    Physical Exam Updated Vital Signs BP 126/81 (BP Location: Right Arm)   Pulse 93   Temp 98.1 F (36.7 C) (Oral)   Resp 16   SpO2 99%   Physical Exam  Constitutional: She is oriented to person, place, and time. She appears well-developed and well-nourished. No distress.  HENT:  Head: Normocephalic and atraumatic.  Cardiovascular:  Normal rate, regular rhythm and normal heart sounds.  No murmur heard. Pulmonary/Chest: Effort normal and breath sounds normal. No respiratory distress.  Abdominal: Soft. She exhibits no distension. There is no tenderness.  Musculoskeletal:  Tenderness to palpation along the plantar surface of the left foot.  Also has diffuse tenderness to the ankle and distal lower left leg.  No swelling appreciated.  No erythema, ecchymosis or warmth to the touch.  Full range of motion. No midline cervical tenderness. 2+ DP.  Neurological: She is alert and oriented to person, place, and time.  Skin: Skin is warm and dry.  Nursing note and vitals reviewed.    ED Treatments / Results  Labs (all labs ordered are listed, but only abnormal results are displayed) Labs Reviewed - No data to display  EKG  EKG Interpretation None       Radiology Dg Ankle Complete Left  Result Date: 04/09/2017 CLINICAL DATA:  LEFT foot and leg pain since this morning, recent axillary surgery on 04/03/2017, patient thinks she has a blood clot EXAM: LEFT ANKLE COMPLETE - 3+  VIEW COMPARISON:  None. FINDINGS: Osseous mineralization normal. Joint spaces preserved. No acute fracture, dislocation, or bone destruction. IMPRESSION: Normal exam. Electronically Signed   By: Ulyses Southward M.D.   On: 04/09/2017 20:17   Dg Foot Complete Left  Result Date: 04/09/2017 CLINICAL DATA:  LEFT foot and leg pain since this morning, recent axillary surgery on 04/03/2017, patient thinks she has a blood clot EXAM: LEFT FOOT - COMPLETE 3+ VIEW COMPARISON:  None FINDINGS: Osseous mineralization normal. Joint spaces preserved. No acute fracture, dislocation, or bone destruction. IMPRESSION: No acute osseous abnormalities. Electronically Signed   By: Ulyses Southward M.D.   On: 04/09/2017 20:16    Procedures Procedures (including critical care time)  Medications Ordered in ED Medications - No data to display   Initial Impression / Assessment and Plan / ED Course  I have reviewed the triage vital signs and the nursing notes.  Pertinent labs & imaging results that were available during my care of the patient were reviewed by me and considered in my medical decision making (see chart for details).    Courtney [name] is a 27 y.o. female who presents to ED for left foot and ankle pain which began this morning. NVI on exam. No erythema or warmth to the touch. Doubt infectious etiology. Did have surgical excision and drainage of bilateral axillary hydradenitis on 1/30 which could put her at risk for DVT. Ultrasound obtained and negative. X-rays negative as well. She can not use crutches due to recent axillary procedure, therefore placed in cam walker for comfort. She is ambulatory in ED with cam walker without difficulty. Ortho follow up encouraged. Reasons to return to ER discussed and all questions answered.   Final Clinical Impressions(s) / ED Diagnoses   Final diagnoses:  Left foot pain    ED Discharge Orders        Ordered    ibuprofen (ADVIL,MOTRIN) 800 MG tablet  Every 8 hours PRN      04/09/17 2037       Ward, Chase Picket, PA-C 04/09/17 2114    Doug Sou, MD 04/09/17 2358

## 2017-04-09 NOTE — Progress Notes (Signed)
Left lower extremity venous duplex completed. No evidence of DVT, superficial thrombosis, or Bakr's cyst. Toma DeitersVirginia Jarrett [name], RVS 04/09/2017 7:22 PM

## 2017-04-09 NOTE — ED Triage Notes (Signed)
Per EMS-left leg and foot pain that started this am-states she had underarm surgery on the 30th of January and now she thinks she has a blood clot

## 2017-04-09 NOTE — ED Notes (Signed)
Bed: [id number] Expected date:  Expected time:  Means of arrival:  Comments: [place]

## 2017-04-10 ENCOUNTER — Telehealth: Payer: Self-pay | Admitting: Family Medicine

## 2017-04-10 NOTE — Telephone Encounter (Signed)
Copied from CRM [phone]. Topic: Quick Communication - See Telephone Encounter >> Apr 10, 2017  9:18 AM Guinevere FerrariMorris, Muntaha Vermette E, NT wrote: CRM for notification. See Telephone encounter for: Lillia AbedLindsay called and wanted to see if the doctor received orders for wound care. Pls sign and fax back to [phone]  04/10/17.

## 2017-04-10 NOTE — Discharge Summary (Signed)
Physician Discharge Summary  Patient ID: Courtney [name] MRN: [id number] DOB/AGE: [birth year] 24 y.o.  Admit date: 04/03/2017 Discharge date: 04/10/2017  Admission Diagnoses:  Discharge Diagnoses:  Active Problems:   Axillary hidradenitis suppurativa   Discharged Condition: good  Hospital Course: The patient underwent incision and drainage of hidradenitis in both axillas.  She tolerated the surgery well.  Postoperatively she was started on wet-to-dry dressing changes.  Once she could tolerate the dressing changes with just oral pain medicine then she was ready for discharge home.  Consults: None  Significant Diagnostic Studies: none  Treatments: surgery: as above  Discharge Exam: Blood pressure (!) 95/45, pulse 85, temperature 98 F (36.7 C), temperature source Oral, resp. rate 16, weight 78 kg (172 lb), last menstrual period 02/26/2017, SpO2 98 %. General appearance: alert and cooperative Resp: clear to auscultation bilaterally Cardio: regular rate and rhythm GI: soft, non-tender; bowel sounds normal; no masses,  no organomegaly Skin: axillary wounds clean  Disposition: 01-Home or Self Care  Discharge Instructions    Call MD for:  difficulty breathing, headache or visual disturbances   Complete by:  As directed    Call MD for:  extreme fatigue   Complete by:  As directed    Call MD for:  hives   Complete by:  As directed    Call MD for:  persistant dizziness or light-headedness   Complete by:  As directed    Call MD for:  persistant nausea and vomiting   Complete by:  As directed    Call MD for:  redness, tenderness, or signs of infection (pain, swelling, redness, odor or green/yellow discharge around incision site)   Complete by:  As directed    Call MD for:  severe uncontrolled pain   Complete by:  As directed    Call MD for:  temperature >100.4   Complete by:  As directed    Diet - low sodium heart healthy   Complete by:  As directed    Discharge instructions    Complete by:  As directed    Shower daily. Pack wounds with gauze at least once a day. Diet and activity as tolerated   Increase activity slowly   Complete by:  As directed      Allergies as of 04/05/2017      Reactions   Shellfish Allergy Anaphylaxis   Tape Hives, Other (See Comments)   Pt can only tolerate cloth tape   Morphine And Related    " I become very angry"   Other    ABD pad causes rash   Iodine Nausea Only   Vancomycin Other (See Comments)   Burns her vein really bad and always causes them to blow.      Medication List    STOP taking these medications   amoxicillin-clavulanate 875-125 MG tablet Commonly known as:  AUGMENTIN     TAKE these medications   acetaminophen 500 MG tablet Commonly known as:  TYLENOL 1000 mg every 8 hours till you do not need pain medicine to move your arm around and use it.  You can buy this over the counter without a prescription.   ciprofloxacin 0.3 % ophthalmic ointment Commonly known as:  CILOXAN Apply 1/2 ribbon of ointment to right lower eye lid TID x 5 d   clindamycin 1 % gel Commonly known as:  CLINDAGEL Apply 1 application topically daily as needed (for wound site).   clindamycin 300 MG capsule Commonly known as:  CLEOCIN Take 300  mg by mouth 2 (two) times daily.   EPINEPHrine 0.3 mg/0.3 mL Soaj injection Commonly known as:  EPI-PEN Inject 0.3 mg into the muscle as needed (for allergic reaction).   HUMIRA 40 MG/0.4ML Pskt Generic drug:  Adalimumab Inject 40 mg into the skin every Saturday.   KYLEENA 19.5 MG Iud Generic drug:  Levonorgestrel Kyleena 17.5 mcg/24 hour (5 years) intrauterine device  Take 1 device by intrauterine route.   methylphenidate 20 MG/9HR Commonly known as:  DAYTRANA Place 2 patches onto the skin daily. wear patch for 9 hours only each day   montelukast 10 MG tablet Commonly known as:  SINGULAIR TAKE 1 TABLET AT BEDTIME What changed:    how much to take  how to take this  when to  take this   ondansetron 4 MG disintegrating tablet Commonly known as:  ZOFRAN ODT Take 1 tablet (4 mg total) by mouth every 8 (eight) hours as needed for nausea or vomiting.   oxyCODONE 5 MG immediate release tablet Commonly known as:  Oxy IR/ROXICODONE Take 1 tablet (5 mg total) every 4 (four) hours as needed by mouth for severe pain.   oxyCODONE-acetaminophen 5-325 MG tablet Commonly known as:  PERCOCET/ROXICET Take 1 tablet by mouth every 6 (six) hours as needed for severe pain. What changed:  Another medication with the same name was added. Make sure you understand how and when to take each.   oxyCODONE-acetaminophen 5-325 MG tablet Commonly known as:  PERCOCET/ROXICET Take 1-2 tablets by mouth every 6 (six) hours as needed for moderate pain. What changed:  You were already taking a medication with the same name, and this prescription was added. Make sure you understand how and when to take each.   PRESCRIPTION MEDICATION Inject into the muscle every 7 (seven) days. Allergy shot      Follow-up Information    Chevis Pretty III, MD Follow up in 2 week(s).   Specialty:  General Surgery Contact information: [address] [place] [address] [phone]           Signed: Robyne Askew 04/10/2017, 2:59 PM

## 2017-04-10 NOTE — Telephone Encounter (Signed)
I have not received any orders since last ones were faxed on 12.03.18/When I rec new forms we will take care of them/thx dmf

## 2017-04-17 NOTE — Telephone Encounter (Signed)
Called and LMOVM that I have not received any orders and gave fax number for her to get them here to me thx dmf   Copied from CRM [phone]. Topic: Quick Communication - See Telephone Encounter >> Apr 10, 2017  9:18 AM Courtney [name], Courtney [name], NT wrote: CRM for notification. See Telephone encounter for: Courtney AbedLindsay called and wanted to see if the doctor received orders for wound care. Pls sign and fax back to [phone]  04/10/17. >> Apr 17, 2017 12:21 PM Louie BunPalacios Medina, [name] wrote: Haynes BastLinnsey called back and really needs to talk to Dr. Dayton MartesAron or her CMA about the orders for the patient that she needs. Please call her back at [phone] ext. [phone] until 1 today can leave message or call her tomorrow.

## 2017-04-18 ENCOUNTER — Ambulatory Visit: Payer: 59 | Admitting: Family Medicine

## 2017-04-18 NOTE — Telephone Encounter (Signed)
Spoke with Courtney [name], gave her 2 different fax number at our office to try. Courtney [name] said if she still get the error then she will have to IT look into their fax machine.

## 2017-04-22 ENCOUNTER — Encounter: Admit: 2017-04-22 | Discharge: 2017-04-23 | Payer: PRIVATE HEALTH INSURANCE

## 2017-04-22 DIAGNOSIS — R7309 Other abnormal glucose: Secondary | ICD-10-CM

## 2017-04-22 DIAGNOSIS — L910 Hypertrophic scar: Secondary | ICD-10-CM

## 2017-04-22 DIAGNOSIS — Z79899 Other long term (current) drug therapy: Secondary | ICD-10-CM

## 2017-04-22 DIAGNOSIS — L732 Hidradenitis suppurativa: Principal | ICD-10-CM

## 2017-04-22 MED ORDER — SPIRONOLACTONE 100 MG TABLET
ORAL_TABLET | 3 refills | 0 days | Status: CP
Start: 2017-04-22 — End: 2018-05-21

## 2017-04-23 ENCOUNTER — Ambulatory Visit (INDEPENDENT_AMBULATORY_CARE_PROVIDER_SITE_OTHER): Payer: 59 | Admitting: Family Medicine

## 2017-04-23 ENCOUNTER — Encounter: Payer: Self-pay | Admitting: Family Medicine

## 2017-04-23 DIAGNOSIS — M79672 Pain in left foot: Secondary | ICD-10-CM | POA: Diagnosis not present

## 2017-04-23 NOTE — Progress Notes (Signed)
Subjective:   Patient ID: Courtney [name], female    DOB: 1990-04-14, 27 y.o.   MRN: [id number]  Courtney [name] is a pleasant 27 y.o. year old female who presents to clinic today with Hospitalization Follow-up (Patient is here today for a ED F/U from 2.5.19 for left foot pain.  They did venous doppler which was negative for DVT. She states that they Dx with Plantar Faciitis.  )  on 04/23/2017  HPI:  Was seen in the ER on 04/09/17 for left foot pain.  Note reviewed.  She did have a surgical excision and drainage of bilateral axillary hydradenitis on 1/30 which did put her at increased risk of DVT. Dopplers neg. Xray neg as well.  Diagnosed with plantar fascitis and sent home.  Pain has since resolved. Current Outpatient Medications on File Prior to Visit  Medication Sig Dispense Refill  . Adalimumab (HUMIRA) 40 MG/0.4ML PSKT Inject 40 mg into the skin every Saturday.    Marland Kitchen. EPINEPHrine 0.3 mg/0.3 mL IJ SOAJ injection Inject 0.3 mg into the muscle as needed (for allergic reaction).    Marland Kitchen. ibuprofen (ADVIL,MOTRIN) 800 MG tablet Take 1 tablet (800 mg total) by mouth every 8 (eight) hours as needed. 21 tablet 0  . Levonorgestrel (KYLEENA) 19.5 MG IUD Kyleena 17.5 mcg/24 hour (5 years) intrauterine device  Take 1 device by intrauterine route.    . methylphenidate (DAYTRANA) 20 MG/9HR Place 2 patches onto the skin daily. wear patch for 9 hours only each day 60 patch 0  . montelukast (SINGULAIR) 10 MG tablet TAKE 1 TABLET AT BEDTIME (Patient taking differently: Take 10 mg by mouth at bedtime as needed for allergies) 90 tablet 2  . mupirocin ointment (BACTROBAN) 2 % APPLY TO LESION 1 TO 2 TIMES A DAY  3  . ondansetron (ZOFRAN ODT) 4 MG disintegrating tablet Take 1 tablet (4 mg total) by mouth every 8 (eight) hours as needed for nausea or vomiting. 20 tablet 0  . oxyCODONE-acetaminophen (PERCOCET/ROXICET) 5-325 MG tablet Take 1 tablet by mouth every 6 (six) hours as needed for severe pain.    Marland Kitchen.  spironolactone (ALDACTONE) 100 MG tablet Take 1 tablet by mouth daily.     No current facility-administered medications on file prior to visit.     Allergies  Allergen Reactions  . Other Hives    ABD pad causes rash Pt can only tolerate cloth tape  . Shellfish Allergy Anaphylaxis  . Tapentadol Hives and Other (See Comments)    Pt can only tolerate cloth tape  . Tape Hives and Other (See Comments)    Pt can only tolerate cloth tape  . Morphine And Related     " I become very angry"  . Iodine Nausea Only  . Vancomycin Other (See Comments)    Burns her vein really bad and always causes them to blow.    Past Medical History:  Diagnosis Date  . ADHD (attention deficit hyperactivity disorder)   . Chronic tonsillitis 08/2013   current strep, will finish antibiotic 09/04/2013; snores during sleep, mother denies apnea  . Family history of adverse reaction to anesthesia    " My cousin had problems waking up. "  . Heart murmur    "when I was born; outgrew it; I was a preemie"  . Hidradenitis   . Migraine 12/19/2016   "I've just had this one for 3 days" (12/20/2016)    Past Surgical History:  Procedure Laterality Date  . AXILLARY HIDRADENITIS EXCISION    .  HYDRADENITIS EXCISION Bilateral 04/03/2017   Procedure: EXCISION AND DRAINAGE BILATERAL  HIDRADENITIS AXILLA;  Surgeon: Griselda Miner, MD;  [facility];  Service: General;  Laterality: Bilateral;  . INCISION AND DRAINAGE ABSCESS Right 07/20/2016   Procedure: INCISION AND DRAINAGE ABSCESS right axilla;  Surgeon: Abigail Miyamoto, MD;  Location: [facility] OR;  Service: General;  Laterality: Right;  . INTRAUTERINE DEVICE (IUD) INSERTION  11/2016   "had the one in my left arm removed"  . IRRIGATION AND DEBRIDEMENT ABSCESS Right 12/21/2016   Procedure: IRRIGATION AND DEBRIDEMENT RIGHT AXILLARY HIDRADENITIS;  Surgeon: Andria Meuse, MD;  Location: [facility] OR;  Service: General;  Laterality: Right;  . IRRIGATION AND DEBRIDEMENT ABSCESS Left  01/08/2017   Procedure: IRRIGATION AND DEBRIDEMENT AXILLARY ABSCESS;  Surgeon: Griselda Miner, MD;  Location: [facility] OR;  Service: General;  Laterality: Left;  . TONSILLECTOMY Bilateral 09/14/2013   Procedure: BILATERAL TONSILLECTOMY;  Surgeon: Serena Colonel, MD;  Location: [facility];  Service: ENT;  Laterality: Bilateral;  . TONSILLECTOMY    . TYMPANOPLASTY Bilateral    "rebuilt eardrums"  . WISDOM TOOTH EXTRACTION      Family History  Problem Relation Age of Onset  . Hypertension Mother   . Hyperlipidemia Mother     Social History   Socioeconomic History  . Marital status: Single    Spouse name: Not on file  . Number of children: Not on file  . Years of education: Not on file  . Highest education level: Not on file  Social Needs  . Financial resource strain: Not on file  . Food insecurity - worry: Not on file  . Food insecurity - inability: Not on file  . Transportation needs - medical: Not on file  . Transportation needs - non-medical: Not on file  Occupational History  . Not on file  Tobacco Use  . Smoking status: Never Smoker  . Smokeless tobacco: Never Used  Substance and Sexual Activity  . Alcohol use: Yes    Comment: occasional  . Drug use: No  . Sexual activity: Not on file  Other Topics Concern  . Not on file  Social History Narrative   Attends [employer]- getting a degree in business and criminal justice   The PMH, PSH, Social History, Family History, Medications, and allergies have been reviewed in [facility], and have been updated if relevant.   Review of Systems  Musculoskeletal: Negative.   All other systems reviewed and are negative.      Objective:    BP (!) 108/56 (BP Location: Right Arm, Patient Position: Sitting, Cuff Size: Normal)   Pulse (!) 108   Temp 98.5 F (36.9 C) (Oral)   Ht 5\' 1"  (1.549 m)   Wt 176 lb 3.2 oz (79.9 kg)   SpO2 98%   BMI 33.29 kg/m    Physical Exam  Constitutional: She is oriented to  person, place, and time. She appears well-developed and well-nourished. No distress.  HENT:  Head: Normocephalic and atraumatic.  Eyes: Conjunctivae are normal.  Cardiovascular: Normal rate.  Pulmonary/Chest: Effort normal.  Musculoskeletal: Normal range of motion.  Neurological: She is alert and oriented to person, place, and time. No cranial nerve deficit.  Skin: Skin is warm and dry. She is not diaphoretic.  Psychiatric: She has a normal mood and affect. Her behavior is normal. Judgment and thought content normal.  Nursing note and vitals reviewed.         Assessment & Plan:   Left foot  pain No Follow-up on file.

## 2017-04-23 NOTE — Assessment & Plan Note (Signed)
Resolved. Does seem consistent with plantar fascitis. No further work up or treatment indicated. Follow up as needed.

## 2017-04-23 NOTE — Patient Instructions (Signed)
Great to see you. Say hi to your mom for me.

## 2017-04-26 DIAGNOSIS — L732 Hidradenitis suppurativa: Secondary | ICD-10-CM | POA: Diagnosis not present

## 2017-05-23 ENCOUNTER — Other Ambulatory Visit: Payer: Self-pay | Admitting: Family Medicine

## 2017-05-23 MED ORDER — METHYLPHENIDATE 20 MG/9HR TD PTCH: 2.0000 | MEDICATED_PATCH | Freq: Every day | TRANSDERMAL | 0 refills | Status: DC

## 2017-05-23 NOTE — Telephone Encounter (Signed)
Last office visit: 12/06/16 Wellness visit  [facility] [address]  [place], [place]

## 2017-05-23 NOTE — Telephone Encounter (Signed)
Pt was left a detailed message about prescription and when/where to pick it up. TLG

## 2017-05-23 NOTE — Telephone Encounter (Signed)
Copied from CRM #[id number]. Topic: Quick Communication - Rx Refill/Question >> May 23, 2017 12:00 PM Clack, Princella PellegriniJessica D wrote: Medication: methylphenidate [facility](DAYTRANA) 20 MG/9HR [[id number]][[id number]]  Has the patient contacted their pharmacy? Yes.   (Agent: If no, request that the patient contact the pharmacy for the refill.) Preferred Pharmacy (with phone number or street name): [facility] [phone][id number] [place] - [address] [phone] (Phone) [phone] (Fax)  Agent: Please be advised that RX refills may take up to 3 business days. We ask that you follow-up with your pharmacy.

## 2017-05-28 ENCOUNTER — Emergency Department (HOSPITAL_COMMUNITY): Payer: 59

## 2017-05-28 ENCOUNTER — Encounter (HOSPITAL_COMMUNITY): Payer: Self-pay

## 2017-05-28 ENCOUNTER — Encounter (HOSPITAL_COMMUNITY)
Admission: EM | Disposition: A | Discharge status: Home / Self Care | Payer: Self-pay | Source: Home / Self Care | Attending: Emergency Medicine

## 2017-05-28 ENCOUNTER — Other Ambulatory Visit: Payer: Self-pay

## 2017-05-28 ENCOUNTER — Emergency Department (HOSPITAL_COMMUNITY): Payer: 59 | Admitting: Certified Registered Nurse Anesthetist

## 2017-05-28 ENCOUNTER — Emergency Department (HOSPITAL_COMMUNITY)
Admission: EM | Admit: 2017-05-28 | Discharge: 2017-05-28 | Disposition: A | Discharge status: Home / Self Care | Payer: 59 | Attending: Emergency Medicine | Admitting: Emergency Medicine

## 2017-05-28 DIAGNOSIS — N201 Calculus of ureter: Secondary | ICD-10-CM | POA: Diagnosis not present

## 2017-05-28 DIAGNOSIS — R1031 Right lower quadrant pain: Secondary | ICD-10-CM | POA: Diagnosis not present

## 2017-05-28 DIAGNOSIS — Z79899 Other long term (current) drug therapy: Secondary | ICD-10-CM | POA: Insufficient documentation

## 2017-05-28 DIAGNOSIS — F909 Attention-deficit hyperactivity disorder, unspecified type: Secondary | ICD-10-CM | POA: Diagnosis not present

## 2017-05-28 DIAGNOSIS — N132 Hydronephrosis with renal and ureteral calculous obstruction: Secondary | ICD-10-CM

## 2017-05-28 DIAGNOSIS — E559 Vitamin D deficiency, unspecified: Secondary | ICD-10-CM | POA: Diagnosis not present

## 2017-05-28 DIAGNOSIS — R110 Nausea: Secondary | ICD-10-CM | POA: Diagnosis not present

## 2017-05-28 DIAGNOSIS — N390 Urinary tract infection, site not specified: Secondary | ICD-10-CM | POA: Diagnosis not present

## 2017-05-28 DIAGNOSIS — N1330 Unspecified hydronephrosis: Secondary | ICD-10-CM | POA: Insufficient documentation

## 2017-05-28 DIAGNOSIS — R509 Fever, unspecified: Secondary | ICD-10-CM | POA: Insufficient documentation

## 2017-05-28 DIAGNOSIS — R1013 Epigastric pain: Secondary | ICD-10-CM | POA: Diagnosis not present

## 2017-05-28 DIAGNOSIS — R109 Unspecified abdominal pain: Secondary | ICD-10-CM | POA: Diagnosis not present

## 2017-05-28 HISTORY — PX: CYSTOSCOPY W/ URETERAL STENT PLACEMENT: SHX1429

## 2017-05-28 LAB — COMPREHENSIVE METABOLIC PANEL
ALT: 18 U/L (ref 14–54)
AST: 24 U/L (ref 15–41)
Albumin: 3.9 g/dL (ref 3.5–5.0)
Alkaline Phosphatase: 92 U/L (ref 38–126)
Anion gap: 11 (ref 5–15)
BILIRUBIN TOTAL: 0.7 mg/dL (ref 0.3–1.2)
BUN: 11 mg/dL (ref 6–20)
CALCIUM: 9.5 mg/dL (ref 8.9–10.3)
CO2: 24 mmol/L (ref 22–32)
CREATININE: 0.86 mg/dL (ref 0.44–1.00)
Chloride: 104 mmol/L (ref 101–111)
GFR calc Af Amer: >60 mL/min (ref >60)
Glucose, Bld: 94 mg/dL (ref 65–99)
POTASSIUM: 3.8 mmol/L (ref 3.5–5.1)
Sodium: 139 mmol/L (ref 135–145)
TOTAL PROTEIN: 8.3 g/dL — AB (ref 6.5–8.1)

## 2017-05-28 LAB — URINALYSIS, ROUTINE W REFLEX MICROSCOPIC
Bilirubin Urine: NEGATIVE
GLUCOSE, UA: NEGATIVE mg/dL
Ketones, ur: NEGATIVE mg/dL
NITRITE: POSITIVE — AB
Protein, ur: 30 mg/dL — AB
Specific Gravity, Urine: 1.016 (ref 1.005–1.030)
pH: 6 (ref 5.0–8.0)

## 2017-05-28 LAB — CBC
HCT: 39.8 % (ref 36.0–46.0)
Hemoglobin: 13.3 g/dL (ref 12.0–15.0)
MCH: 29.8 pg (ref 26.0–34.0)
MCHC: 33.4 g/dL (ref 30.0–36.0)
MCV: 89 fL (ref 78.0–100.0)
Platelets: 346 10*3/uL (ref 150–400)
RBC: 4.47 MIL/uL (ref 3.87–5.11)
RDW: 13.6 % (ref 11.5–15.5)
WBC: 18.8 10*3/uL — AB (ref 4.0–10.5)

## 2017-05-28 LAB — I-STAT CG4 LACTIC ACID, ED: LACTIC ACID, VENOUS: 1 mmol/L (ref 0.5–1.9)

## 2017-05-28 LAB — I-STAT BETA HCG BLOOD, ED (MC, WL, AP ONLY): I-stat hCG, quantitative: <5 m[IU]/mL (ref <5)

## 2017-05-28 LAB — POC OCCULT BLOOD, ED: Fecal Occult Bld: NEGATIVE

## 2017-05-28 LAB — LIPASE, BLOOD: Lipase: 36 U/L (ref 11–51)

## 2017-05-28 SURGERY — CYSTOSCOPY, WITH RETROGRADE PYELOGRAM AND URETERAL STENT INSERTION
Anesthesia: General | Site: Ureter | Laterality: Right

## 2017-05-28 MED ORDER — SODIUM CHLORIDE 0.9 % IR SOLN
Status: DC | PRN
  Administered 2017-05-28: 3000 mL

## 2017-05-28 MED ORDER — LACTATED RINGERS IV SOLN
INTRAVENOUS | Status: DC
  Administered 2017-05-28: 15:00:00 via INTRAVENOUS

## 2017-05-28 MED ORDER — PROPOFOL 10 MG/ML IV BOLUS
INTRAVENOUS | Status: AC
  Filled 2017-05-28: qty 40

## 2017-05-28 MED ORDER — DEXAMETHASONE SODIUM PHOSPHATE 4 MG/ML IJ SOLN
INTRAMUSCULAR | Status: DC | PRN
  Administered 2017-05-28: 10 mg via INTRAVENOUS

## 2017-05-28 MED ORDER — IOPAMIDOL (ISOVUE-300) INJECTION 61%
100.0000 mL | Freq: Once | INTRAVENOUS | Status: AC | PRN
  Administered 2017-05-28: 100 mL via INTRAVENOUS

## 2017-05-28 MED ORDER — SODIUM CHLORIDE 0.9 % IJ SOLN
INTRAMUSCULAR | Status: AC
  Filled 2017-05-28: qty 50

## 2017-05-28 MED ORDER — HYDROMORPHONE HCL 1 MG/ML IJ SOLN: 1.0000 mg | Freq: Once | INTRAMUSCULAR | Status: DC

## 2017-05-28 MED ORDER — OXYCODONE-ACETAMINOPHEN 5-325 MG PO TABS
1.0000 | ORAL_TABLET | Freq: Once | ORAL | Status: AC
  Administered 2017-05-28: 1 via ORAL
  Filled 2017-05-28: qty 1

## 2017-05-28 MED ORDER — PHENAZOPYRIDINE HCL 200 MG PO TABS: 200.0000 mg | ORAL_TABLET | Freq: Three times a day (TID) | ORAL | 0 refills | Status: DC | PRN

## 2017-05-28 MED ORDER — LIDOCAINE 2% (20 MG/ML) 5 ML SYRINGE
INTRAMUSCULAR | Status: DC | PRN
  Administered 2017-05-28: 80 mg via INTRAVENOUS

## 2017-05-28 MED ORDER — LIDOCAINE HCL 2 % EX GEL
CUTANEOUS | Status: AC
  Filled 2017-05-28: qty 5

## 2017-05-28 MED ORDER — TRAMADOL HCL 50 MG PO TABS: 50.0000 mg | ORAL_TABLET | Freq: Four times a day (QID) | ORAL | 0 refills | Status: DC | PRN

## 2017-05-28 MED ORDER — CIPROFLOXACIN HCL 500 MG PO TABS: 500.0000 mg | ORAL_TABLET | Freq: Two times a day (BID) | ORAL | 0 refills | Status: DC

## 2017-05-28 MED ORDER — PHENYLEPHRINE 40 MCG/ML (10ML) SYRINGE FOR IV PUSH (FOR BLOOD PRESSURE SUPPORT)
PREFILLED_SYRINGE | INTRAVENOUS | Status: AC
  Filled 2017-05-28: qty 10

## 2017-05-28 MED ORDER — LACTATED RINGERS IV SOLN: INTRAVENOUS | Status: DC

## 2017-05-28 MED ORDER — MIDAZOLAM HCL 2 MG/2ML IJ SOLN
INTRAMUSCULAR | Status: AC
  Filled 2017-05-28: qty 2

## 2017-05-28 MED ORDER — FENTANYL CITRATE (PF) 100 MCG/2ML IJ SOLN
INTRAMUSCULAR | Status: DC | PRN
  Administered 2017-05-28 (×2): 25 ug via INTRAVENOUS
  Administered 2017-05-28: 50 ug via INTRAVENOUS

## 2017-05-28 MED ORDER — SCOPOLAMINE 1 MG/3DAYS TD PT72
MEDICATED_PATCH | TRANSDERMAL | Status: DC | PRN
  Administered 2017-05-28: 1 via TRANSDERMAL

## 2017-05-28 MED ORDER — ONDANSETRON 4 MG PO TBDP
4.0000 mg | ORAL_TABLET | Freq: Once | ORAL | Status: AC
  Administered 2017-05-28: 4 mg via ORAL
  Filled 2017-05-28: qty 1

## 2017-05-28 MED ORDER — MIDAZOLAM HCL 2 MG/2ML IJ SOLN
INTRAMUSCULAR | Status: DC | PRN
  Administered 2017-05-28: 2 mg via INTRAVENOUS

## 2017-05-28 MED ORDER — BELLADONNA ALKALOIDS-OPIUM 16.2-60 MG RE SUPP
RECTAL | Status: DC | PRN
  Administered 2017-05-28: 1 via RECTAL

## 2017-05-28 MED ORDER — METOCLOPRAMIDE HCL 5 MG/ML IJ SOLN: 10.0000 mg | Freq: Once | INTRAMUSCULAR | Status: DC | PRN

## 2017-05-28 MED ORDER — LIDOCAINE HCL 2 % EX GEL
CUTANEOUS | Status: DC | PRN
  Administered 2017-05-28: 1 via TOPICAL

## 2017-05-28 MED ORDER — PHENYLEPHRINE 40 MCG/ML (10ML) SYRINGE FOR IV PUSH (FOR BLOOD PRESSURE SUPPORT)
PREFILLED_SYRINGE | INTRAVENOUS | Status: DC | PRN
  Administered 2017-05-28: 80 ug via INTRAVENOUS

## 2017-05-28 MED ORDER — IOPAMIDOL (ISOVUE-300) INJECTION 61%
INTRAVENOUS | Status: AC
  Filled 2017-05-28: qty 100

## 2017-05-28 MED ORDER — FENTANYL CITRATE (PF) 100 MCG/2ML IJ SOLN
INTRAMUSCULAR | Status: AC
  Filled 2017-05-28: qty 2

## 2017-05-28 MED ORDER — KETOROLAC TROMETHAMINE 30 MG/ML IJ SOLN
30.0000 mg | Freq: Once | INTRAMUSCULAR | Status: AC
  Administered 2017-05-28: 30 mg via INTRAVENOUS
  Filled 2017-05-28: qty 1

## 2017-05-28 MED ORDER — ONDANSETRON HCL 4 MG/2ML IJ SOLN
INTRAMUSCULAR | Status: AC
  Filled 2017-05-28: qty 2

## 2017-05-28 MED ORDER — MEPERIDINE HCL 50 MG/ML IJ SOLN: 6.2500 mg | INTRAMUSCULAR | Status: DC | PRN

## 2017-05-28 MED ORDER — DEXAMETHASONE SODIUM PHOSPHATE 10 MG/ML IJ SOLN
INTRAMUSCULAR | Status: AC
  Filled 2017-05-28: qty 1

## 2017-05-28 MED ORDER — SODIUM CHLORIDE 0.9 % IV SOLN
1.0000 g | Freq: Once | INTRAVENOUS | Status: AC
  Administered 2017-05-28: 1 g via INTRAVENOUS
  Filled 2017-05-28: qty 10

## 2017-05-28 MED ORDER — FENTANYL CITRATE (PF) 100 MCG/2ML IJ SOLN: 25.0000 ug | INTRAMUSCULAR | Status: DC | PRN

## 2017-05-28 MED ORDER — SCOPOLAMINE 1 MG/3DAYS TD PT72
MEDICATED_PATCH | TRANSDERMAL | Status: AC
  Filled 2017-05-28: qty 1

## 2017-05-28 MED ORDER — PROPOFOL 10 MG/ML IV BOLUS
INTRAVENOUS | Status: DC | PRN
  Administered 2017-05-28: 200 mg via INTRAVENOUS

## 2017-05-28 MED ORDER — ONDANSETRON HCL 4 MG/2ML IJ SOLN
INTRAMUSCULAR | Status: DC | PRN
  Administered 2017-05-28: 4 mg via INTRAVENOUS

## 2017-05-28 MED ORDER — BELLADONNA-OPIUM 16.2-30 MG RE SUPP
RECTAL | Status: AC
  Filled 2017-05-28: qty 1

## 2017-05-28 SURGICAL SUPPLY — 21 items
BAG URO CATCHER STRL LF (MISCELLANEOUS) ×3 IMPLANT
BASKET DAKOTA 1.9FR 11X120 (BASKET) IMPLANT
BASKET ZERO TIP NITINOL 2.4FR (BASKET) IMPLANT
CATH URET 5FR 28IN OPEN ENDED (CATHETERS) ×3 IMPLANT
CLOTH BEACON ORANGE TIMEOUT ST (SAFETY) ×3 IMPLANT
COVER FOOTSWITCH UNIV (MISCELLANEOUS) IMPLANT
COVER SURGICAL LIGHT HANDLE (MISCELLANEOUS) ×3 IMPLANT
GLOVE BIOGEL M STRL SZ7.5 (GLOVE) ×3 IMPLANT
GOWN STRL REUS W/TWL XL LVL3 (GOWN DISPOSABLE) ×3 IMPLANT
GUIDEWIRE ANG ZIPWIRE 038X150 (WIRE) IMPLANT
GUIDEWIRE STR DUAL SENSOR (WIRE) ×3 IMPLANT
MANIFOLD NEPTUNE II (INSTRUMENTS) ×3 IMPLANT
PACK CYSTO (CUSTOM PROCEDURE TRAY) ×3 IMPLANT
SHEATH ACCESS URETERAL 24CM (SHEATH) IMPLANT
SHEATH ACCESS URETERAL 54CM (SHEATH) IMPLANT
SHEATH URETERAL 12FRX35CM (MISCELLANEOUS) IMPLANT
STENT URET 6FRX24 CONTOUR (STENTS) ×3 IMPLANT
TUBING CONNECTING 10 (TUBING) ×2 IMPLANT
TUBING CONNECTING 10' (TUBING) ×1
TUBING UROLOGY SET (TUBING) ×3 IMPLANT
WIRE COONS/BENSON .038X145CM (WIRE) IMPLANT

## 2017-05-28 NOTE — ED Provider Notes (Addendum)
[facility] Provider Note   CSN: [id number] Arrival date & time: 05/28/17  0239     History   Chief Complaint Chief Complaint  Patient presents with  . Abdominal Pain    HPI Courtney [name] is a 27 y.o. female with a h/o of HS on humira and ADHD who presents to the emergency department with a chief complaint of abdominal pain, nausea, chills, and BRBPR.  Patient endorses gradually worsening epigastric pain characterized as "pressure-like", that began 2 days ago. States the pain last for a few minutes 2 days ago, but became constant yesterday and began to shoot from the RUQ and epigastric area to the RLQ.  She rates the current pain as 10/10. Pain is improved when she lays on her left side and curls up in the fetal position and worse when she lays on her back or on her right side.  She states that she had one episode of similar pain several years ago and had an ultrasound of the abdomen which was unremarkable.  She endorses associated nausea, which has been constant and chills. She also had 3 BMs two days ago, and the first two contained BRBPR that filled turned the toilet bowl red with blood. Third BM was a normal soft, BM.  She denies constipation or straining.  No history of similar.  Denies vomiting, diarrhea, dysuria, flank pain, gross hematuria, URI sx, rash, back pain, chest pain, dyspnea, weakness, vaginal pain or discharge.  LMP 12/18. She has an IUD and is not sexually active. States this was her first period since she was 8.  She also endorses anorexia since onset of symptoms. States she has really only eat 2 corn dogs without the breading over the last 2 days.  He has been taking ibuprofen for pain without improvement  No history of nephrolithiasis.  No family history of IBD.  The history is provided by the patient. No language interpreter was used.  Abdominal Pain   This is a new problem. The current episode started 2 days ago. The problem occurs  constantly. The problem has been gradually worsening. The pain is associated with an unknown factor. The pain is located in the RLQ. The pain is at a severity of 10/10. The pain is severe. Associated symptoms include nausea. Pertinent negatives include fever, diarrhea, vomiting, constipation, dysuria and headaches.    Past Medical History:  Diagnosis Date  . ADHD (attention deficit hyperactivity disorder)   . Chronic tonsillitis 08/2013   current strep, will finish antibiotic 09/04/2013; snores during sleep, mother denies apnea  . Family history of adverse reaction to anesthesia    " My cousin had problems waking up. "  . Heart murmur    "when I was born; outgrew it; I was a preemie"  . Hidradenitis   . Migraine 12/19/2016   "I've just had this one for 3 days" (12/20/2016)    Patient Active Problem List   Diagnosis Date Noted  . Left foot pain 04/23/2017  . Axillary hidradenitis suppurativa 04/03/2017  . Axillary abscess 12/20/2016  . Vitamin D deficiency 10/18/2014  . S/P tonsillectomy 09/14/2013  . ADHD (attention deficit hyperactivity disorder) 02/11/2012    Past Surgical History:  Procedure Laterality Date  . AXILLARY HIDRADENITIS EXCISION    . CYSTOSCOPY W/ URETERAL STENT PLACEMENT Right 05/28/2017   Procedure: CYSTOSCOPY WITH RETROGRADE PYELOGRAM/ RIGHT URETERAL STENT PLACEMENT;  Surgeon: Crist Fat, MD;  Location: [facility];  Service: Urology;  Laterality: Right;  . HYDRADENITIS  EXCISION Bilateral 04/03/2017   Procedure: EXCISION AND DRAINAGE BILATERAL  HIDRADENITIS AXILLA;  Surgeon: Griselda Miner, MD;  Location: [facility] OR;  Service: General;  Laterality: Bilateral;  . INCISION AND DRAINAGE ABSCESS Right 07/20/2016   Procedure: INCISION AND DRAINAGE ABSCESS right axilla;  Surgeon: Abigail Miyamoto, MD;  Location: [facility] OR;  Service: General;  Laterality: Right;  . INTRAUTERINE DEVICE (IUD) INSERTION  11/2016   "had the one in my left arm removed"  . IRRIGATION AND DEBRIDEMENT  ABSCESS Right 12/21/2016   Procedure: IRRIGATION AND DEBRIDEMENT RIGHT AXILLARY HIDRADENITIS;  Surgeon: Andria Meuse, MD;  Location: [facility] OR;  Service: General;  Laterality: Right;  . IRRIGATION AND DEBRIDEMENT ABSCESS Left 01/08/2017   Procedure: IRRIGATION AND DEBRIDEMENT AXILLARY ABSCESS;  Surgeon: Griselda Miner, MD;  Location: [facility] OR;  Service: General;  Laterality: Left;  . TONSILLECTOMY Bilateral 09/14/2013   Procedure: BILATERAL TONSILLECTOMY;  Surgeon: Serena Colonel, MD;  Location: [facility];  Service: ENT;  Laterality: Bilateral;  . TONSILLECTOMY    . TYMPANOPLASTY Bilateral    "rebuilt eardrums"  . WISDOM TOOTH EXTRACTION       OB History   None      Home Medications    Prior to Admission medications   Medication Sig Start Date End Date Taking? Authorizing Provider  Adalimumab (HUMIRA) 40 MG/0.4ML PSKT Inject 40 mg into the skin every Saturday.   Yes [provider]  clindamycin (CLEOCIN) 300 MG capsule Take 300 mg by mouth daily as needed (When she feels like she needs it).   Yes [provider]  diphenhydrAMINE (BENADRYL) 25 mg capsule Take 25 mg by mouth every 6 (six) hours as needed for itching or allergies.   Yes [provider]  EPINEPHrine 0.3 mg/0.3 mL IJ SOAJ injection Inject 0.3 mg into the muscle as needed (for allergic reaction).   Yes [provider]  ibuprofen (ADVIL,MOTRIN) 800 MG tablet Take 1 tablet (800 mg total) by mouth every 8 (eight) hours as needed. 04/09/17  Yes Ward, Chase Picket, PA-C  Levonorgestrel [facility]) 19.5 MG IUD Kyleena 17.5 mcg/24 hour (5 years) intrauterine device  Take 1 device by intrauterine route.   Yes [provider]  methylphenidate (DAYTRANA) 20 MG/9HR Place 2 patches onto the skin daily. wear patch for 9 hours only each day 05/23/17  Yes Dianne Dun, MD  montelukast (SINGULAIR) 10 MG tablet TAKE 1 TABLET AT BEDTIME Patient taking differently: Take 10 mg by mouth at  bedtime as needed for allergies 02/06/16  Yes Dianne Dun, MD  ondansetron (ZOFRAN ODT) 4 MG disintegrating tablet Take 1 tablet (4 mg total) by mouth every 8 (eight) hours as needed for nausea or vomiting. 12/24/16  Yes Focht, Jessica L, PA  spironolactone (ALDACTONE) 100 MG tablet Take 1 tablet by mouth daily. 04/22/17  Yes [provider]  ciprofloxacin (CIPRO) 500 MG tablet Take 1 tablet (500 mg total) by mouth 2 (two) times daily. 05/28/17   Crist Fat, MD  mupirocin ointment (BACTROBAN) 2 % APPLY TO LESION 1 TO 2 TIMES A DAY 03/31/17   [provider]  phenazopyridine (PYRIDIUM) 200 MG tablet Take 1 tablet (200 mg total) by mouth 3 (three) times daily as needed for pain. 05/28/17   Crist Fat, MD  traMADol (ULTRAM) 50 MG tablet Take 1-2 tablets (50-100 mg total) by mouth every 6 (six) hours as needed for moderate pain. 05/28/17   Crist Fat, MD    Family  History Family History  Problem Relation Age of Onset  . Hypertension Mother   . Hyperlipidemia Mother     Social History Social History   Tobacco Use  . Smoking status: Never Smoker  . Smokeless tobacco: Never Used  Substance Use Topics  . Alcohol use: Yes    Comment: occasional  . Drug use: No     Allergies   Other; Shellfish allergy; Tapentadol; Tape; Morphine and related; Iodine; and Vancomycin   Review of Systems Review of Systems  Constitutional: Positive for appetite change and chills. Negative for activity change and fever.  HENT: Negative for congestion.   Respiratory: Negative for shortness of breath.   Cardiovascular: Negative for chest pain.  Gastrointestinal: Positive for abdominal pain, blood in stool and nausea. Negative for anal bleeding, constipation, diarrhea and vomiting.  Genitourinary: Negative for dysuria, flank pain, vaginal discharge and vaginal pain.  Musculoskeletal: Negative for back pain.  Skin: Negative for rash.  Allergic/Immunologic: Negative for  immunocompromised state.  Neurological: Negative for weakness and headaches.  Psychiatric/Behavioral: Negative for confusion.     Physical Exam Updated Vital Signs BP (!) 98/50 (BP Location: Left Arm)   Pulse (!) 101   Temp 98.6 F (37 C)   Resp (!) 21   Ht 5\' 1"  (1.549 m)   Wt 77.1 kg (170 lb)   SpO2 100%   BMI 32.12 kg/m   Physical Exam  Constitutional: No distress.  HENT:  Head: Normocephalic.  Eyes: Conjunctivae are normal.  Neck: Normal range of motion. Neck supple.  Cardiovascular: Normal rate, regular rhythm, normal heart sounds and intact distal pulses. Exam reveals no gallop and no friction rub.  No murmur heard. Pulmonary/Chest: Effort normal. No stridor. No respiratory distress. She has no wheezes. She has no rales. She exhibits no tenderness.  Abdominal: Soft. She exhibits no distension and no mass. There is tenderness. There is no rebound and no guarding. No hernia.  Tender to palpation in the right lower quadrant with rebound tenderness and tenderness over McBurney's point..  Mildly tender to palpation in the right upper quadrant.  Negative Murphy sign.  Left-sided abdominal exam is unremarkable.  No CVA tenderness bilaterally.  Musculoskeletal: Normal range of motion. She exhibits no edema, tenderness or deformity.  Neurological: She is alert.  Skin: Skin is warm. Capillary refill takes less than 2 seconds. No rash noted. She is not diaphoretic.  Psychiatric: Her behavior is normal.  Nursing note and vitals reviewed.  ED Treatments / Results  Labs (all labs ordered are listed, but only abnormal results are displayed) Labs Reviewed  CULTURE, BLOOD (ROUTINE X 2) - Abnormal; Notable for the following components:      Result Value   Culture   (*)    Value: DIPHTHEROIDS(CORYNEBACTERIUM SPECIES) Standardized susceptibility testing for this organism is not available. Performed at [facility], [address] [place], [place] [address]    All other  components within normal limits  URINE CULTURE - Abnormal; Notable for the following components:   Culture >=100,000 COLONIES/mL ESCHERICHIA COLI (*)    Organism ID, Bacteria ESCHERICHIA COLI (*)    All other components within normal limits  COMPREHENSIVE METABOLIC PANEL - Abnormal; Notable for the following components:   Total Protein 8.3 (*)    All other components within normal limits  CBC - Abnormal; Notable for the following components:   WBC 18.8 (*)    All other components within normal limits  URINALYSIS, ROUTINE W REFLEX MICROSCOPIC - Abnormal; Notable  for the following components:   APPearance CLOUDY (*)    Hgb urine dipstick MODERATE (*)    Protein, ur 30 (*)    Nitrite POSITIVE (*)    Leukocytes, UA LARGE (*)    Bacteria, UA MANY (*)    Squamous Epithelial / LPF 6-30 (*)    Non Squamous Epithelial 0-5 (*)    All other components within normal limits  CULTURE, BLOOD (ROUTINE X 2)  BLOOD CULTURE ID PANEL (REFLEXED)  LIPASE, BLOOD  I-STAT BETA HCG BLOOD, ED ([facility], [facility], [facility] ONLY)  POC OCCULT BLOOD, ED  I-STAT CG4 LACTIC ACID, ED  I-STAT CG4 LACTIC ACID, ED    EKG None  Radiology No results found.  Procedures .Critical Care Performed by: Barkley Boards, PA-C Authorized by: Barkley Boards, PA-C     (including critical care time)  Medications Ordered in ED Medications  oxyCODONE-acetaminophen (PERCOCET/ROXICET) 5-325 MG per tablet 1 tablet (1 tablet Oral Given 05/28/17 0730)  ondansetron (ZOFRAN-ODT) disintegrating tablet 4 mg (4 mg Oral Given 05/28/17 0729)  iopamidol (ISOVUE-300) 61 % injection 100 mL (100 mLs Intravenous Contrast Given 05/28/17 0943)  cefTRIAXone (ROCEPHIN) 1 g in sodium chloride 0.9 % 100 mL IVPB (0 g Intravenous Stopped 05/28/17 1206)  ketorolac (TORADOL) 30 MG/ML injection 30 mg (30 mg Intravenous Given 05/28/17 1101)  scopolamine (TRANSDERM-SCOP) 1 MG/3DAYS (  Override pull for Anesthesia 05/28/17 1607)     Initial Impression / Assessment  and Plan / ED Course  I have reviewed the triage vital signs and the nursing notes.  Pertinent labs & imaging results that were available during my care of the patient were reviewed by me and considered in my medical decision making (see chart for details).    27 year old female with a history of at bedtime on Humira and ADHD presenting with right-sided abdominal pain, anorexia, nausea, chills, and BRBPR.  Tachycardic in the 110s on arrival.  Afebrile, but the patient has been taking NSAIDs for pain control over the last 24 hours at home.  Leukocytosis of 18.  Pain initially controlled with Percocet.  Nausea resolved with Zofran.  Hemoccult is negative.  Urinalysis with moderate hemoglobinuria, nitrite positive, large amount of leukocytes and many bacteria.  CT abdomen pelvis with 5 mm stone in the proximal right ureter with mild right hydronephrosis and a 7 mm nonobstructive calculus in the left kidney. She is NPO. Lactate is normal. Will withhold fluids at this time.  On re-evaluation, pain has returned, Resolved with Toradol.  1 dose of Rocephin given in the ED.  Urine culture sent.  Spoke with Dr. Marlou Porch, urology, who will see the patient in the ED for stent placement this afternoon. Currently, the patient appears reasonably stabilized for procedure considering the current resources, flow, and capabilities available in the ED at this time, and I doubt any other [facility] requiring further screening and/or treatment in the ED prior to admission.  Final Clinical Impressions(s) / ED Diagnoses   Final diagnoses:  Ureteral stone with hydronephrosis    ED Discharge Orders        Ordered    ciprofloxacin (CIPRO) 500 MG tablet  2 times daily     05/28/17 1635    phenazopyridine (PYRIDIUM) 200 MG tablet  3 times daily PRN     05/28/17 1635    traMADol (ULTRAM) 50 MG tablet  Every 6 hours PRN     05/28/17 1635       Thresa Dozier A, PA-C 05/28/17 1431  Derwood Kaplan, MD 05/30/17 2318      Frederik Pear A, PA-C 06/06/17 1610    Derwood Kaplan, MD 06/08/17 [phone]

## 2017-05-28 NOTE — Anesthesia Postprocedure Evaluation (Signed)
Anesthesia Post Note  Patient: Courtney [name]  Procedure(s) Performed: CYSTOSCOPY WITH RETROGRADE PYELOGRAM/ RIGHT URETERAL STENT PLACEMENT (Right Ureter)     Patient location during evaluation: PACU Anesthesia Type: General Level of consciousness: awake and alert Pain management: pain level controlled Vital Signs Assessment: post-procedure vital signs reviewed and stable Respiratory status: spontaneous breathing, nonlabored ventilation, respiratory function stable and patient connected to nasal cannula oxygen Cardiovascular status: blood pressure returned to baseline and stable Postop Assessment: no apparent nausea or vomiting Anesthetic complications: no    Last Vitals:  Vitals:   05/28/17 1636 05/28/17 1645  BP:  116/63  Pulse:  (!) 113  Resp:  (!) 23  Temp: 37 C   SpO2:  99%    Last Pain:  Vitals:   05/28/17 1517  TempSrc: Oral  PainSc:                  Phillips Groutarignan, Luvina Poirier

## 2017-05-28 NOTE — Anesthesia Procedure Notes (Signed)
Procedure Name: LMA Insertion Date/Time: 05/28/2017 3:58 PM Performed by: Vanessa Durhamochran, Reo Portela Glenn, CRNA Pre-anesthesia Checklist: Emergency Drugs available, Patient identified, Suction available and Patient being monitored Patient Re-evaluated:Patient Re-evaluated prior to induction Oxygen Delivery Method: Circle system utilized Preoxygenation: Pre-oxygenation with 100% oxygen Induction Type: IV induction Ventilation: Mask ventilation without difficulty LMA: LMA inserted LMA Size: 4.0 Number of attempts: 1 Placement Confirmation: positive ETCO2 and breath sounds checked- equal and bilateral Tube secured with: Tape Dental Injury: Teeth and Oropharynx as per pre-operative assessment

## 2017-05-28 NOTE — Anesthesia Preprocedure Evaluation (Signed)
Anesthesia Evaluation  Patient identified by MRN, date of birth, ID band Patient awake    Reviewed: Allergy & Precautions, NPO status , Patient's Chart, lab work & pertinent test results  Airway Mallampati: II  TM Distance: >3 FB Neck ROM: Full    Dental no notable dental hx.    Pulmonary neg pulmonary ROS,    Pulmonary exam normal breath sounds clear to auscultation       Cardiovascular negative cardio ROS Normal cardiovascular exam Rhythm:Regular Rate:Normal     Neuro/Psych negative neurological ROS  negative psych ROS   GI/Hepatic negative GI ROS, Neg liver ROS,   Endo/Other  negative endocrine ROS  Renal/GU negative Renal ROS  negative genitourinary   Musculoskeletal negative musculoskeletal ROS (+)   Abdominal   Peds negative pediatric ROS (+)  Hematology negative hematology ROS (+)   Anesthesia Other Findings   Reproductive/Obstetrics negative OB ROS                             Anesthesia Physical Anesthesia Plan  ASA: I  Anesthesia Plan: General   Post-op Pain Management:    Induction: Intravenous  PONV Risk Score and Plan: 4 or greater and Ondansetron, Dexamethasone, Midazolam, Scopolamine patch - Pre-op and Treatment may vary due to age or medical condition  Airway Management Planned: LMA  Additional Equipment:   Intra-op Plan:   Post-operative Plan:   Informed Consent: I have reviewed the patients History and Physical, chart, labs and discussed the procedure including the risks, benefits and alternatives for the proposed anesthesia with the patient or authorized representative who has indicated his/her understanding and acceptance.   Dental advisory given  Plan Discussed with: CRNA  Anesthesia Plan Comments:         Anesthesia Quick Evaluation

## 2017-05-28 NOTE — H&P (Addendum)
Urology H+P Note   Admitting Attending: Dr. Marlou PorchHerrick  Chief Complaint:  Right flank pain  HPI: Courtney [name] is a 27 y.o. female with hidradenitis who presents with 2 days of intermittent right flank pain. This worsened yesterday and became constant. This was partially relieved by Motrin. Nausea, but no vomiting. Subjective chills, but no fevers. No urinary sx, no GH.   Presented to the ED today, where she was found to be tachycardic, but with otherwise normal vitals. CT revealed an obstructing 5mm right ureteral stone.   No prior stone episodes.   Strong family hx stones.    Past Medical History: Past Medical History:  Diagnosis Date  . ADHD (attention deficit hyperactivity disorder)   . Chronic tonsillitis 08/2013   current strep, will finish antibiotic 09/04/2013; snores during sleep, mother denies apnea  . Family history of adverse reaction to anesthesia    " My cousin had problems waking up. "  . Heart murmur    "when I was born; outgrew it; I was a preemie"  . Hidradenitis   . Migraine 12/19/2016   "I've just had this one for 3 days" (12/20/2016)    Past Surgical History:  Past Surgical History:  Procedure Laterality Date  . AXILLARY HIDRADENITIS EXCISION    . HYDRADENITIS EXCISION Bilateral 04/03/2017   Procedure: EXCISION AND DRAINAGE BILATERAL  HIDRADENITIS AXILLA;  Surgeon: Griselda Mineroth, Paul III, MD;  [facility];  Service: General;  Laterality: Bilateral;  . INCISION AND DRAINAGE ABSCESS Right 07/20/2016   Procedure: INCISION AND DRAINAGE ABSCESS right axilla;  Surgeon: Abigail MiyamotoBlackman, Douglas, MD;  [facility];  Service: General;  Laterality: Right;  . INTRAUTERINE DEVICE (IUD) INSERTION  11/2016   "had the one in my left arm removed"  . IRRIGATION AND DEBRIDEMENT ABSCESS Right 12/21/2016   Procedure: IRRIGATION AND DEBRIDEMENT RIGHT AXILLARY HIDRADENITIS;  Surgeon: Andria MeuseWhite, Christopher M, MD;  Location: [facility] OR;  Service: General;  Laterality: Right;  . IRRIGATION AND  DEBRIDEMENT ABSCESS Left 01/08/2017   Procedure: IRRIGATION AND DEBRIDEMENT AXILLARY ABSCESS;  Surgeon: Griselda Mineroth, Paul III, MD;  [facility];  Service: General;  Laterality: Left;  . TONSILLECTOMY Bilateral 09/14/2013   Procedure: BILATERAL TONSILLECTOMY;  Surgeon: Serena ColonelJefry Rosen, MD;  Location: [facility];  Service: ENT;  Laterality: Bilateral;  . TONSILLECTOMY    . TYMPANOPLASTY Bilateral    "rebuilt eardrums"  . WISDOM TOOTH EXTRACTION      Medication: Current Facility-Administered Medications  Medication Dose Route Frequency Provider Last Rate Last Dose  . iopamidol (ISOVUE-300) 61 % injection           . sodium chloride 0.9 % injection            Current Outpatient Medications  Medication Sig Dispense Refill  . Adalimumab (HUMIRA) 40 MG/0.4ML PSKT Inject 40 mg into the skin every Saturday.    . clindamycin (CLEOCIN) 300 MG capsule Take 300 mg by mouth daily as needed (When she feels like she needs it).    . diphenhydrAMINE (BENADRYL) 25 mg capsule Take 25 mg by mouth every 6 (six) hours as needed for itching or allergies.    Marland Kitchen. EPINEPHrine 0.3 mg/0.3 mL IJ SOAJ injection Inject 0.3 mg into the muscle as needed (for allergic reaction).    Marland Kitchen. ibuprofen (ADVIL,MOTRIN) 800 MG tablet Take 1 tablet (800 mg total) by mouth every 8 (eight) hours as needed. 21 tablet 0  . Levonorgestrel (KYLEENA) 19.5 MG IUD Kyleena 17.5 mcg/24 hour (5 years) intrauterine device  Take 1 device by intrauterine route.    . methylphenidate (DAYTRANA) 20 MG/9HR Place 2 patches onto the skin daily. wear patch for 9 hours only each day 60 patch 0  . montelukast (SINGULAIR) 10 MG tablet TAKE 1 TABLET AT BEDTIME (Patient taking differently: Take 10 mg by mouth at bedtime as needed for allergies) 90 tablet 2  . ondansetron (ZOFRAN ODT) 4 MG disintegrating tablet Take 1 tablet (4 mg total) by mouth every 8 (eight) hours as needed for nausea or vomiting. 20 tablet 0  . spironolactone (ALDACTONE) 100 MG tablet  Take 1 tablet by mouth daily.    . mupirocin ointment (BACTROBAN) 2 % APPLY TO LESION 1 TO 2 TIMES A DAY  3    Allergies: Allergies  Allergen Reactions  . Other Hives    ABD pad causes rash Pt can only tolerate cloth tape  . Shellfish Allergy Anaphylaxis  . Tapentadol Hives and Other (See Comments)    Pt can only tolerate cloth tape  . Tape Hives and Other (See Comments)    Pt can only tolerate cloth tape  . Morphine And Related     " I become very angry"  . Iodine Nausea Only  . Vancomycin Other (See Comments)    Burns her vein really bad and always causes them to blow.    Social History: Social History   Tobacco Use  . Smoking status: Never Smoker  . Smokeless tobacco: Never Used  Substance Use Topics  . Alcohol use: Yes    Comment: occasional  . Drug use: No    Family History Family History  Problem Relation Age of Onset  . Hypertension Mother   . Hyperlipidemia Mother     Review of Systems 10 systems were reviewed and are negative except as noted specifically in the HPI.  Objective   Vital signs in last 24 hours: BP 118/61   Pulse (!) 102   Temp 97.8 F (36.6 C) (Oral)   Resp 18   Ht 5\' 1"  (1.549 m)   Wt 77.1 kg (170 lb)   SpO2 100%   BMI 32.12 kg/m   Physical Exam General: NAD, A&O, resting, appropriate HEENT: [place]/AT, EOMI, MMM Pulmonary: Normal work of breathing Cardiovascular: HDS, adequate peripheral perfusion Abdomen: Soft, NTTP, nondistended. GU: +R CVA tenderness Extremities: warm and well perfused Neuro: Appropriate, no focal neurological deficits  Most Recent Labs: Lab Results  Component Value Date   WBC 18.8 (H) 05/28/2017   HGB 13.3 05/28/2017   HCT 39.8 05/28/2017   PLT 346 05/28/2017    Lab Results  Component Value Date   NA 139 05/28/2017   K 3.8 05/28/2017   CL 104 05/28/2017   CO2 24 05/28/2017   BUN 11 05/28/2017   CREATININE 0.86 05/28/2017   CALCIUM 9.5 05/28/2017    Lab Results  Component Value Date   INR  1.07 12/19/2016     IMAGING: Ct Abdomen Pelvis W Contrast  Result Date: 05/28/2017 CLINICAL DATA:  Abdominal pain for the past 2 days. EXAM: CT ABDOMEN AND PELVIS WITH CONTRAST TECHNIQUE: Multidetector CT imaging of the abdomen and pelvis was performed using the standard protocol following bolus administration of intravenous contrast. CONTRAST:  ISOVUE-300 IOPAMIDOL (ISOVUE-300) INJECTION 61% COMPARISON:  Abdominal ultrasound dated May 21, 2012. FINDINGS: Lower chest: No acute abnormality. Hepatobiliary: No focal liver abnormality is seen. No gallstones, gallbladder wall thickening, or biliary dilatation. Pancreas: Unremarkable. No pancreatic ductal dilatation or surrounding inflammatory changes. Spleen: Normal in size  without focal abnormality. Adrenals/Urinary Tract: The adrenal glands are unremarkable. There is a 5 mm calculus in the proximal right ureter with mild right hydronephrosis. Additional nonobstructive 7 mm calculus in the lower pole of the left kidney. The bladder is unremarkable for the degree of distention. Stomach/Bowel: Stomach is within normal limits. Appendix appears normal. No evidence of bowel wall thickening, distention, or inflammatory changes. Vascular/Lymphatic: No significant vascular findings are present. Mildly enlarged left inguinal lymph node measuring up to 1.3 cm in short axis. No other abdominal or pelvic lymphadenopathy. Reproductive: Uterus and bilateral adnexa are unremarkable. IUD within the uterus. Other: Tiny fat containing umbilical hernia. No free fluid or pneumoperitoneum. Musculoskeletal: No acute or significant osseous findings. IMPRESSION: 1. 5 mm calculus in the proximal right ureter with resultant mild right hydronephrosis. 2. Additional 7 mm nonobstructive calculus in the left kidney. 3. Normal appendix. 4. Non-specific mildly enlarged left inguinal lymph node. Correlate with physical exam for any skin infection in the left groin or proximal leg.  Otherwise, recommend follow-up contrast-enhanced CT in 3 months to evaluate for interval change. This recommendation follows ACR consensus guidelines: White Paper of the ACR Incidental Findings Committee II on Splenic and Nodal Findings. J Am Coll Radiol [phone]. Electronically Signed   By: Obie Dredge M.D.   On: 05/28/2017 10:14    ------  Assessment:  27 y.o. female with an obstructing 5mm proximal right ureteral stone with a UA concerning for infection. Tachycardic to 100's. But otherwise with normal vitals. WBC 18.8, Cr 0.86. Ucx pending. CTX 1g in ED.   Discussed etiology, natural hx, and treatment options for her stone. Given infection in the setting of obstruction, will plan for stent placement. Rationale explained, along with risks.    Plan: - NPO for right ureteral stent placement

## 2017-05-28 NOTE — ED Triage Notes (Signed)
Pt complains of abdominal pain for two days, denies vomiting and diarrhea

## 2017-05-28 NOTE — ED Notes (Signed)
ED TO INPATIENT HANDOFF REPORT  Name/Age/Gender Courtney [name] 27 y.o. female  Code Status Code Status History    Date Active Date Inactive Code Status Order ID Comments User Context   04/03/2017 1834 04/05/2017 1659 Full Code [id number]  Jovita Kussmaul, MD Inpatient   01/08/2017 1316 01/12/2017 1628 Full Code [id number]  Carlena Hurl, [place] Inpatient   12/20/2016 2109 12/21/2016 1131 Full Code [id number]  Greer Pickerel, MD Inpatient   07/20/2016 0049 07/24/2016 1920 Full Code [id number]  Georganna Skeans, MD Inpatient   09/14/2013 0933 09/14/2013 1730 Full Code [id number]  Izora Gala, MD Inpatient    Advance Directive Documentation     Most Recent Value  Type of Advance Directive  Healthcare Power of Attorney, Living will  Pre-existing out of facility DNR order (yellow form or pink MOST form)  -  "MOST" Form in Place?  -      Home/SNF/Other Home  Chief Complaint Abdominal Pain  Level of Care/Admitting Diagnosis ED Disposition    None      Medical History Past Medical History:  Diagnosis Date  . ADHD (attention deficit hyperactivity disorder)   . Chronic tonsillitis 08/2013   current strep, will finish antibiotic 09/04/2013; snores during sleep, mother denies apnea  . Family history of adverse reaction to anesthesia    " My cousin had problems waking up. "  . Heart murmur    "when I was born; outgrew it; I was a preemie"  . Hidradenitis   . Migraine 12/19/2016   "I've just had this one for 3 days" (12/20/2016)    Allergies Allergies  Allergen Reactions  . Other Hives    ABD pad causes rash Pt can only tolerate cloth tape  . Shellfish Allergy Anaphylaxis  . Tapentadol Hives and Other (See Comments)    Pt can only tolerate cloth tape  . Tape Hives and Other (See Comments)    Pt can only tolerate cloth tape  . Morphine And Related     " I become very angry"  . Iodine Nausea Only  . Vancomycin Other (See Comments)    Burns her vein really bad and always causes them to blow.     IV Location/Drains/Wounds Patient Lines/Drains/Airways Status   Active Line/Drains/Airways    Name:   Placement date:   Placement time:   Site:   Days:   Peripheral IV 05/28/17 Right Hand   05/28/17    0728    Hand   less than 1   Incision (Closed) 07/20/16 Axilla Right   07/20/16    1438     312   Incision (Closed) 12/21/16 Axilla Right   12/21/16    1033     158   Incision (Closed) 01/08/17 Axilla   01/08/17    1745     140   Incision (Closed) 04/03/17 Axilla Left   04/03/17    1500     55   Incision (Closed) 04/03/17 Axilla Right   04/03/17    1500     55   Wound / Incision (Open or Dehisced) 07/20/16 Other (Comment) Axilla Right   07/20/16    0045    Axilla   312          Labs/Imaging Results for orders placed or performed during the hospital encounter of 05/28/17 (from the past 48 hour(s))  Urinalysis, Routine w reflex microscopic     Status: Abnormal   Collection Time: 05/28/17  4:05 AM  Result Value Ref Range  Color, Urine YELLOW YELLOW   APPearance CLOUDY (A) CLEAR   Specific Gravity, Urine 1.016 1.005 - 1.030   pH 6.0 5.0 - 8.0   Glucose, UA NEGATIVE NEGATIVE mg/dL   Hgb urine dipstick MODERATE (A) NEGATIVE   Bilirubin Urine NEGATIVE NEGATIVE   Ketones, ur NEGATIVE NEGATIVE mg/dL   Protein, ur 30 (A) NEGATIVE mg/dL   Nitrite POSITIVE (A) NEGATIVE   Leukocytes, UA LARGE (A) NEGATIVE   RBC / HPF 6-30 0 - 5 RBC/hpf   WBC, UA TOO NUMEROUS TO COUNT 0 - 5 WBC/hpf   Bacteria, UA MANY (A) NONE SEEN   Squamous Epithelial / LPF 6-30 (A) NONE SEEN   Mucus PRESENT    Non Squamous Epithelial 0-5 (A) NONE SEEN    Comment: Performed at [facility], [place] [address], [place], [place] [address]  Lipase, blood     Status: None   Collection Time: 05/28/17  7:32 AM  Result Value Ref Range   Lipase 36 11 - 51 U/L    Comment: Performed at [facility], [place] [address], [place], [place] [address]  Comprehensive metabolic panel     Status:  Abnormal   Collection Time: 05/28/17  7:32 AM  Result Value Ref Range   Sodium 139 135 - 145 mmol/L   Potassium 3.8 3.5 - 5.1 mmol/L   Chloride 104 101 - 111 mmol/L   CO2 24 22 - 32 mmol/L   Glucose, Bld 94 65 - 99 mg/dL   BUN 11 6 - 20 mg/dL   Creatinine, Ser 0.86 0.44 - 1.00 mg/dL   Calcium 9.5 8.9 - 10.3 mg/dL   Total Protein 8.3 (H) 6.5 - 8.1 g/dL   Albumin 3.9 3.5 - 5.0 g/dL   AST 24 15 - 41 U/L   ALT 18 14 - 54 U/L   Alkaline Phosphatase 92 38 - 126 U/L   Total Bilirubin 0.7 0.3 - 1.2 mg/dL   GFR calc non Af Amer >60 >60 mL/min   GFR calc Af Amer >60 >60 mL/min    Comment: (NOTE) The eGFR has been calculated using the CKD EPI equation. This calculation has not been validated in all clinical situations. eGFR's persistently <60 mL/min signify possible Chronic Kidney Disease.    Anion gap 11 5 - 15    Comment: Performed at [facility], [place] [address] [place], [place] [address]  CBC     Status: Abnormal   Collection Time: 05/28/17  7:32 AM  Result Value Ref Range   WBC 18.8 (H) 4.0 - 10.5 K/uL   RBC 4.47 3.87 - 5.11 MIL/uL   Hemoglobin 13.3 12.0 - 15.0 g/dL   HCT 39.8 36.0 - 46.0 %   MCV 89.0 78.0 - 100.0 fL   MCH 29.8 26.0 - 34.0 pg   MCHC 33.4 30.0 - 36.0 g/dL   RDW 13.6 11.5 - 15.5 %   Platelets 346 150 - 400 K/uL    Comment: Performed at [facility], [place] [address] [place], [place] [address]  I-Stat beta hCG blood, ED     Status: None   Collection Time: 05/28/17  7:39 AM  Result Value Ref Range   I-stat hCG, quantitative <5.0 <5 mIU/mL   Comment 3            Comment:   GEST. AGE      CONC.  (mIU/mL)   <=1 WEEK        5 - 50  2 WEEKS       50 - 500     3 WEEKS       100 - 10,000     4 WEEKS     1,000 - 30,000        FEMALE AND NON-PREGNANT FEMALE:     LESS THAN 5 mIU/mL   POC occult blood, ED     Status: None   Collection Time: 05/28/17 10:55 AM  Result Value Ref Range   Fecal Occult Bld NEGATIVE NEGATIVE   I-Stat CG4 Lactic Acid, ED     Status: None   Collection Time: 05/28/17 10:57 AM  Result Value Ref Range   Lactic Acid, Venous 1.00 0.5 - 1.9 mmol/L   Ct Abdomen Pelvis W Contrast  Result Date: 05/28/2017 CLINICAL DATA:  Abdominal pain for the past 2 days. EXAM: CT ABDOMEN AND PELVIS WITH CONTRAST TECHNIQUE: Multidetector CT imaging of the abdomen and pelvis was performed using the standard protocol following bolus administration of intravenous contrast. CONTRAST:  176m ISOVUE-300 IOPAMIDOL (ISOVUE-300) INJECTION 61% COMPARISON:  Abdominal ultrasound dated May 21, 2012. FINDINGS: Lower chest: No acute abnormality. Hepatobiliary: No focal liver abnormality is seen. No gallstones, gallbladder wall thickening, or biliary dilatation. Pancreas: Unremarkable. No pancreatic ductal dilatation or surrounding inflammatory changes. Spleen: Normal in size without focal abnormality. Adrenals/Urinary Tract: The adrenal glands are unremarkable. There is a 5 mm calculus in the proximal right ureter with mild right hydronephrosis. Additional nonobstructive 7 mm calculus in the lower pole of the left kidney. The bladder is unremarkable for the degree of distention. Stomach/Bowel: Stomach is within normal limits. Appendix appears normal. No evidence of bowel wall thickening, distention, or inflammatory changes. Vascular/Lymphatic: No significant vascular findings are present. Mildly enlarged left inguinal lymph node measuring up to 1.3 cm in short axis. No other abdominal or pelvic lymphadenopathy. Reproductive: Uterus and bilateral adnexa are unremarkable. IUD within the uterus. Other: Tiny fat containing umbilical hernia. No free fluid or pneumoperitoneum. Musculoskeletal: No acute or significant osseous findings. IMPRESSION: 1. 5 mm calculus in the proximal right ureter with resultant mild right hydronephrosis. 2. Additional 7 mm nonobstructive calculus in the left kidney. 3. Normal appendix. 4. Non-specific mildly  enlarged left inguinal lymph node. Correlate with physical exam for any skin infection in the left groin or proximal leg. Otherwise, recommend follow-up contrast-enhanced CT in 3 months to evaluate for interval change. This recommendation follows ACR consensus guidelines: White Paper of the ACR Incidental Findings Committee II on Splenic and Nodal Findings. [phone] Electronically Signed   By: WTitus DubinM.D.   On: 05/28/2017 10:14    Pending Labs Unresulted Labs (From admission, onward)   Start     Ordered   05/28/17 1333  Urine Culture  Once,   R     05/28/17 1333   05/28/17 1029  Blood culture (routine x 2)  BLOOD CULTURE X 2,   STAT     05/28/17 1028      Vitals/Pain Today's Vitals   05/28/17 1300 05/28/17 1302 05/28/17 1345 05/28/17 1412  BP: 118/61 118/61 106/62 106/62  Pulse: (!) 102 (!) 102 96 96  Resp:  18  18  Temp:      TempSrc:      SpO2: 100% 100% 99% 99%  Weight:      Height:      PainSc:        Isolation Precautions No active isolations  Medications Medications  sodium chloride 0.9 %  injection (has no administration in time range)  iopamidol (ISOVUE-300) 61 % injection (has no administration in time range)  lactated ringers infusion (has no administration in time range)  oxyCODONE-acetaminophen (PERCOCET/ROXICET) 5-325 MG per tablet 1 tablet (1 tablet Oral Given 05/28/17 0730)  ondansetron (ZOFRAN-ODT) disintegrating tablet 4 mg (4 mg Oral Given 05/28/17 0729)  iopamidol (ISOVUE-300) 61 % injection 100 mL (100 mLs Intravenous Contrast Given 05/28/17 0943)  cefTRIAXone (ROCEPHIN) 1 g in sodium chloride 0.9 % 100 mL IVPB (0 g Intravenous Stopped 05/28/17 1206)  ketorolac (TORADOL) 30 MG/ML injection 30 mg (30 mg Intravenous Given 05/28/17 1101)    Mobility walks

## 2017-05-28 NOTE — Transfer of Care (Signed)
Immediate Anesthesia Transfer of Care Note  Patient: Courtney [name]  Procedure(s) Performed: CYSTOSCOPY WITH RETROGRADE PYELOGRAM/ RIGHT URETERAL STENT PLACEMENT (Right Ureter)  Patient Location: PACU  Anesthesia Type:General  Level of Consciousness: awake, alert , oriented and patient cooperative  Airway & Oxygen Therapy: Patient Spontanous Breathing and Patient connected to face mask  Post-op Assessment: Report given to RN and Post -op Vital signs reviewed and stable  Post vital signs: Reviewed and stable  Last Vitals:  Vitals Value Taken Time  BP 116/63 05/28/2017  4:36 PM  Temp    Pulse 126 05/28/2017  4:37 PM  Resp 13 05/28/2017  4:37 PM  SpO2 100 % 05/28/2017  4:37 PM  Vitals shown include unvalidated device data.  Last Pain:  Vitals:   05/28/17 1517  TempSrc: Oral  PainSc:          Complications: No apparent anesthesia complications

## 2017-05-28 NOTE — Op Note (Signed)
Preoperative diagnosis:  1. Right obstructing distal ureteral stone 2. Urinary tract infection  Postoperative diagnosis:  1. Same  Procedure:  1. Cystoscopy 2. right ureteral stent placement 3. right retrograde pyelography with interpretation   Surgeon: Crist FatBenjamin W. Herrick, MD  Anesthesia: General  Complications: None  Intraoperative findings:  right retrograde pyelography demonstrated a filling defect within the right ureter consistent with the patient's known calculus without other abnormalities.  EBL: Minimal  Specimens: None  Indication: Courtney [name] is a 27 y.o. patient with an obstructing right distal ureteral stone with subjective fever.  A urinalysis was nitrite positive, her white blood cell count was 18,000. After reviewing the management options for treatment, he elected to proceed with the above surgical procedure(s). We have discussed the potential benefits and risks of the procedure, side effects of the proposed treatment, the likelihood of the patient achieving the goals of the procedure, and any potential problems that might occur during the procedure or recuperation. Informed consent has been obtained.  Description of procedure:  The patient was taken to the operating room and general anesthesia was induced.  The patient was placed in the dorsal lithotomy position, prepped and draped in the usual sterile fashion, and preoperative antibiotics were administered. A preoperative time-out was performed.   Cystourethroscopy was performed.  The patient's urethra was examined and was normal. The bladder was then systematically examined in its entirety. There was no evidence for any bladder tumors, stones, or other mucosal pathology.    Attention then turned to the rightureteral orifice and a ureteral catheter was used to intubate the ureteral orifice.  Omnipaque contrast was injected through the ureteral catheter and a retrograde pyelogram was performed with findings as  dictated above.  A 0.38 sensor guidewire was then advanced up the right ureter into the renal pelvis under fluoroscopic guidance.  The wire was then backloaded through the cystoscope and a ureteral stent was advance over the wire using Seldinger technique.  The stent was positioned appropriately under fluoroscopic and cystoscopic guidance.  The wire was then removed with an adequate stent curl noted in the renal pelvis as well as in the bladder.  The bladder was then emptied and the procedure ended.  The patient appeared to tolerate the procedure well and without complications.  The patient was able to be awakened and transferred to the recovery unit in satisfactory condition.    Crist FatBenjamin W. Herrick, M.D.

## 2017-05-28 NOTE — Discharge Instructions (Signed)
Ureteral Stent Implantation, Care After °Refer to this sheet in the next few weeks. These instructions provide you with information about caring for yourself after your procedure. Your health care provider may also give you more specific instructions. Your treatment has been planned according to current medical practices, but problems sometimes occur. Call your health care provider if you have any problems or questions after your procedure. °What can I expect after the procedure? °After the procedure, it is common to have: °· Nausea. °· Mild pain when you urinate. You may feel this pain in your lower back or lower abdomen. Pain should stop within a few minutes after you urinate. This may last for up to 1 week. °· A small amount of blood in your urine for several days. ° °Follow these instructions at home: ° °Medicines °· Take over-the-counter and prescription medicines only as told by your health care provider. °· If you were prescribed an antibiotic medicine, take it as told by your health care provider. Do not stop taking the antibiotic even if you start to feel better. °· Do not drive for 24 hours if you received a sedative. °· Do not drive or operate heavy machinery while taking prescription pain medicines. °Activity °· Return to your normal activities as told by your health care provider. Ask your health care provider what activities are safe for you. °· Do not lift anything that is heavier than 10 lb (4.5 kg). Follow this limit for 1 week after your procedure, or for as long as told by your health care provider. °General instructions °· Watch for any blood in your urine. Call your health care provider if the amount of blood in your urine increases. °· If you have a catheter: °? Follow instructions from your health care provider about taking care of your catheter and collection bag. °? Do not take baths, swim, or use a hot tub until your health care provider approves. °· Drink enough fluid to keep your urine  clear or pale yellow. °· Keep all follow-up visits as told by your health care provider. This is important. °Contact a health care provider if: °· You have pain that gets worse or does not get better with medicine, especially pain when you urinate. °· You have difficulty urinating. °· You feel nauseous or you vomit repeatedly during a period of more than 2 days after the procedure. °Get help right away if: °· Your urine is dark red or has blood clots in it. °· You are leaking urine (have incontinence). °· The end of the stent comes out of your urethra. °· You cannot urinate. °· You have sudden, sharp, or severe pain in your abdomen or lower back. °· You have a fever. °This information is not intended to replace advice given to you by your health care provider. Make sure you discuss any questions you have with your health care provider. °Document Released: 10/22/2012 Document Revised: 07/28/2015 Document Reviewed: 09/03/2014 °[employer] Interactive Patient Education © 2018 [employer] °Cystoscopy, Care After °Refer to this sheet in the next few weeks. These instructions provide you with information about caring for yourself after your procedure. Your health care provider may also give you more specific instructions. Your treatment has been planned according to current medical practices, but problems sometimes occur. Call your health care provider if you have any problems or questions after your procedure. °What can I expect after the procedure? °After the procedure, it is common to have: °· Mild pain when you urinate. Pain should   stop within a few minutes after you urinate. This may last for up to 1 week. °· A small amount of blood in your urine for several days. °· Feeling like you need to urinate but producing only a small amount of urine. ° °Follow these instructions at home: ° °Medicines °· Take over-the-counter and prescription medicines only as told by your health care provider. °· If you were prescribed an  antibiotic medicine, take it as told by your health care provider. Do not stop taking the antibiotic even if you start to feel better. °General instructions ° °· Return to your normal activities as told by your health care provider. Ask your health care provider what activities are safe for you. °· Do not drive for 24 hours if you received a sedative. °· Watch for any blood in your urine. If the amount of blood in your urine increases, call your health care provider. °· Follow instructions from your health care provider about eating or drinking restrictions. °· If a tissue sample was removed for testing (biopsy) during your procedure, it is your responsibility to get your test results. Ask your health care provider or the department performing the test when your results will be ready. °· Drink enough fluid to keep your urine clear or pale yellow. °· Keep all follow-up visits as told by your health care provider. This is important. °Contact a health care provider if: °· You have pain that gets worse or does not get better with medicine, especially pain when you urinate. °· You have difficulty urinating. °Get help right away if: °· You have more blood in your urine. °· You have blood clots in your urine. °· You have abdominal pain. °· You have a fever or chills. °· You are unable to urinate. °This information is not intended to replace advice given to you by your health care provider. Make sure you discuss any questions you have with your health care provider. °Document Released: 09/08/2004 Document Revised: 07/28/2015 Document Reviewed: 01/06/2015 °[employer] Interactive Patient Education © 2018 [employer] ° °

## 2017-05-30 LAB — BLOOD CULTURE ID PANEL (REFLEXED)
Acinetobacter baumannii: NOT DETECTED
CANDIDA GLABRATA: NOT DETECTED
Candida albicans: NOT DETECTED
Candida krusei: NOT DETECTED
Candida parapsilosis: NOT DETECTED
Candida tropicalis: NOT DETECTED
ENTEROBACTER CLOACAE COMPLEX: NOT DETECTED
Enterobacteriaceae species: NOT DETECTED
Enterococcus species: NOT DETECTED
Escherichia coli: NOT DETECTED
HAEMOPHILUS INFLUENZAE: NOT DETECTED
KLEBSIELLA PNEUMONIAE: NOT DETECTED
Klebsiella oxytoca: NOT DETECTED
Listeria monocytogenes: NOT DETECTED
NEISSERIA MENINGITIDIS: NOT DETECTED
PROTEUS SPECIES: NOT DETECTED
PSEUDOMONAS AERUGINOSA: NOT DETECTED
SERRATIA MARCESCENS: NOT DETECTED
STAPHYLOCOCCUS AUREUS BCID: NOT DETECTED
STAPHYLOCOCCUS SPECIES: NOT DETECTED
STREPTOCOCCUS AGALACTIAE: NOT DETECTED
STREPTOCOCCUS PNEUMONIAE: NOT DETECTED
STREPTOCOCCUS SPECIES: NOT DETECTED
Streptococcus pyogenes: NOT DETECTED

## 2017-05-31 LAB — URINE CULTURE: Culture: >=100000 — AB

## 2017-06-01 LAB — CULTURE, BLOOD (ROUTINE X 2): SPECIAL REQUESTS: ADEQUATE

## 2017-06-02 LAB — CULTURE, BLOOD (ROUTINE X 2)
Culture: NO GROWTH
Special Requests: ADEQUATE

## 2017-06-03 ENCOUNTER — Telehealth (HOSPITAL_BASED_OUTPATIENT_CLINIC_OR_DEPARTMENT_OTHER): Payer: Self-pay | Admitting: Emergency Medicine

## 2017-06-07 DIAGNOSIS — N201 Calculus of ureter: Secondary | ICD-10-CM | POA: Diagnosis not present

## 2017-06-10 ENCOUNTER — Ambulatory Visit (HOSPITAL_COMMUNITY)
Admission: EM | Admit: 2017-06-10 | Discharge: 2017-06-10 | Disposition: A | Discharge status: Home / Self Care | Payer: 59

## 2017-06-12 DIAGNOSIS — N201 Calculus of ureter: Secondary | ICD-10-CM | POA: Diagnosis not present

## 2017-06-17 ENCOUNTER — Encounter: Admit: 2017-06-17 | Discharge: 2017-06-18 | Payer: PRIVATE HEALTH INSURANCE

## 2017-06-17 DIAGNOSIS — L732 Hidradenitis suppurativa: Principal | ICD-10-CM

## 2017-06-17 DIAGNOSIS — L910 Hypertrophic scar: Secondary | ICD-10-CM

## 2017-06-17 DIAGNOSIS — N201 Calculus of ureter: Secondary | ICD-10-CM | POA: Diagnosis not present

## 2017-06-17 MED ORDER — MUPIROCIN 2 % TOPICAL OINTMENT
3 refills | 0 days | Status: CP
Start: 2017-06-17 — End: ?

## 2017-06-17 MED ORDER — CLINDAMYCIN HCL 300 MG CAPSULE
ORAL_CAPSULE | 2 refills | 0 days | Status: CP
Start: 2017-06-17 — End: 2017-09-16

## 2017-07-10 DIAGNOSIS — Z01419 Encounter for gynecological examination (general) (routine) without abnormal findings: Secondary | ICD-10-CM | POA: Diagnosis not present

## 2017-07-23 DIAGNOSIS — Z87442 Personal history of urinary calculi: Secondary | ICD-10-CM | POA: Diagnosis not present

## 2017-07-23 DIAGNOSIS — N130 Hydronephrosis with ureteropelvic junction obstruction: Secondary | ICD-10-CM | POA: Diagnosis not present

## 2017-09-16 ENCOUNTER — Encounter: Admit: 2017-09-16 | Discharge: 2017-09-17 | Payer: PRIVATE HEALTH INSURANCE

## 2017-09-16 DIAGNOSIS — L732 Hidradenitis suppurativa: Secondary | ICD-10-CM

## 2017-09-16 DIAGNOSIS — G8918 Other acute postprocedural pain: Principal | ICD-10-CM

## 2017-09-16 MED ORDER — RIFAMPIN 300 MG CAPSULE
ORAL_CAPSULE | 2 refills | 0 days | Status: CP
Start: 2017-09-16 — End: 2017-12-18

## 2017-09-16 MED ORDER — CLINDAMYCIN HCL 300 MG CAPSULE
ORAL_CAPSULE | 2 refills | 0 days | Status: CP
Start: 2017-09-16 — End: 2017-12-18

## 2017-09-16 MED ORDER — OXYCODONE-ACETAMINOPHEN 5 MG-325 MG TABLET
ORAL_TABLET | ORAL | 0 refills | 0 days | Status: CP | PRN
Start: 2017-09-16 — End: 2017-12-18

## 2017-09-24 ENCOUNTER — Emergency Department (HOSPITAL_COMMUNITY)
Admission: EM | Admit: 2017-09-24 | Discharge: 2017-09-25 | Disposition: A | Discharge status: Home / Self Care | Payer: 59 | Attending: Emergency Medicine | Admitting: Emergency Medicine

## 2017-09-24 ENCOUNTER — Other Ambulatory Visit: Payer: Self-pay

## 2017-09-24 ENCOUNTER — Encounter (HOSPITAL_COMMUNITY): Payer: Self-pay | Admitting: *Deleted

## 2017-09-24 DIAGNOSIS — R109 Unspecified abdominal pain: Secondary | ICD-10-CM | POA: Diagnosis not present

## 2017-09-24 DIAGNOSIS — R300 Dysuria: Secondary | ICD-10-CM | POA: Diagnosis not present

## 2017-09-24 DIAGNOSIS — Z79899 Other long term (current) drug therapy: Secondary | ICD-10-CM | POA: Insufficient documentation

## 2017-09-24 DIAGNOSIS — N898 Other specified noninflammatory disorders of vagina: Secondary | ICD-10-CM | POA: Diagnosis not present

## 2017-09-24 DIAGNOSIS — R319 Hematuria, unspecified: Secondary | ICD-10-CM | POA: Diagnosis not present

## 2017-09-24 DIAGNOSIS — L02412 Cutaneous abscess of left axilla: Secondary | ICD-10-CM | POA: Diagnosis not present

## 2017-09-24 DIAGNOSIS — R110 Nausea: Secondary | ICD-10-CM | POA: Diagnosis not present

## 2017-09-24 DIAGNOSIS — R1032 Left lower quadrant pain: Secondary | ICD-10-CM | POA: Diagnosis not present

## 2017-09-24 DIAGNOSIS — R509 Fever, unspecified: Secondary | ICD-10-CM | POA: Diagnosis not present

## 2017-09-24 DIAGNOSIS — L0291 Cutaneous abscess, unspecified: Secondary | ICD-10-CM

## 2017-09-24 DIAGNOSIS — R102 Pelvic and perineal pain: Secondary | ICD-10-CM | POA: Diagnosis not present

## 2017-09-24 LAB — COMPREHENSIVE METABOLIC PANEL
ALBUMIN: 3.7 g/dL (ref 3.5–5.0)
ALT: 17 U/L (ref 0–44)
AST: 20 U/L (ref 15–41)
Alkaline Phosphatase: 85 U/L (ref 38–126)
Anion gap: 12 (ref 5–15)
BILIRUBIN TOTAL: 0.4 mg/dL (ref 0.3–1.2)
BUN: 12 mg/dL (ref 6–20)
CHLORIDE: 109 mmol/L (ref 98–111)
CO2: 24 mmol/L (ref 22–32)
CREATININE: 0.97 mg/dL (ref 0.44–1.00)
Calcium: 9.6 mg/dL (ref 8.9–10.3)
GFR calc non Af Amer: >60 mL/min (ref >60)
Glucose, Bld: 84 mg/dL (ref 70–99)
POTASSIUM: 3.9 mmol/L (ref 3.5–5.1)
SODIUM: 145 mmol/L (ref 135–145)
TOTAL PROTEIN: 8.3 g/dL — AB (ref 6.5–8.1)

## 2017-09-24 LAB — URINALYSIS, ROUTINE W REFLEX MICROSCOPIC
BILIRUBIN URINE: NEGATIVE
GLUCOSE, UA: NEGATIVE mg/dL
HGB URINE DIPSTICK: NEGATIVE
KETONES UR: NEGATIVE mg/dL
NITRITE: NEGATIVE
PH: 6 (ref 5.0–8.0)
Protein, ur: NEGATIVE mg/dL
Specific Gravity, Urine: 1.024 (ref 1.005–1.030)

## 2017-09-24 LAB — CBC
HEMATOCRIT: 40.9 % (ref 36.0–46.0)
Hemoglobin: 13.6 g/dL (ref 12.0–15.0)
MCH: 30.1 pg (ref 26.0–34.0)
MCHC: 33.3 g/dL (ref 30.0–36.0)
MCV: 90.5 fL (ref 78.0–100.0)
Platelets: 371 10*3/uL (ref 150–400)
RBC: 4.52 MIL/uL (ref 3.87–5.11)
RDW: 13.1 % (ref 11.5–15.5)
WBC: 14.6 10*3/uL — AB (ref 4.0–10.5)

## 2017-09-24 LAB — I-STAT BETA HCG BLOOD, ED (MC, WL, AP ONLY): I-stat hCG, quantitative: <5 m[IU]/mL (ref <5)

## 2017-09-24 LAB — LIPASE, BLOOD: Lipase: 34 U/L (ref 11–51)

## 2017-09-24 NOTE — ED Triage Notes (Signed)
Pt reports one week of left side and left abdominal pain, worse today. Took percocet at 1530 for pain. C/o nausea and odorous urine, no vomiting/diarrhea.

## 2017-09-25 ENCOUNTER — Emergency Department (HOSPITAL_COMMUNITY): Payer: 59

## 2017-09-25 DIAGNOSIS — R109 Unspecified abdominal pain: Secondary | ICD-10-CM | POA: Diagnosis not present

## 2017-09-25 DIAGNOSIS — R102 Pelvic and perineal pain: Secondary | ICD-10-CM | POA: Diagnosis not present

## 2017-09-25 LAB — WET PREP, GENITAL
CLUE CELLS WET PREP: NONE SEEN
Sperm: NONE SEEN
TRICH WET PREP: NONE SEEN
YEAST WET PREP: NONE SEEN

## 2017-09-25 LAB — GC/CHLAMYDIA PROBE AMP (~~LOC~~) NOT AT ARMC
Chlamydia: NEGATIVE
Neisseria Gonorrhea: NEGATIVE

## 2017-09-25 MED ORDER — CEPHALEXIN 500 MG PO CAPS: 500.0000 mg | ORAL_CAPSULE | Freq: Two times a day (BID) | ORAL | 0 refills | Status: DC

## 2017-09-25 MED ORDER — ONDANSETRON HCL 4 MG/2ML IJ SOLN
4.0000 mg | Freq: Once | INTRAMUSCULAR | Status: AC
  Administered 2017-09-25: 4 mg via INTRAVENOUS
  Filled 2017-09-25: qty 2

## 2017-09-25 MED ORDER — LIDOCAINE-EPINEPHRINE (PF) 2 %-1:200000 IJ SOLN
10.0000 mL | Freq: Once | INTRAMUSCULAR | Status: AC
  Administered 2017-09-25: 10 mL
  Filled 2017-09-25: qty 20

## 2017-09-25 MED ORDER — KETOROLAC TROMETHAMINE 30 MG/ML IJ SOLN
15.0000 mg | Freq: Once | INTRAMUSCULAR | Status: AC
  Administered 2017-09-25: 15 mg via INTRAVENOUS
  Filled 2017-09-25: qty 1

## 2017-09-25 MED ORDER — SODIUM CHLORIDE 0.9 % IV SOLN
1.0000 g | Freq: Once | INTRAVENOUS | Status: AC
  Administered 2017-09-25: 1 g via INTRAVENOUS
  Filled 2017-09-25: qty 10

## 2017-09-25 NOTE — Discharge Instructions (Addendum)
Your work-up has been reassuring in the ED today.  Unknown cause your symptoms.  Motrin and Tylenol for pain.  Have given antibiotics to take for urinary tract infection.  Patient to follow-up with her primary care doctor, GYN doctor and your dermatologist.

## 2017-09-25 NOTE — ED Provider Notes (Signed)
[facility]-EMERGENCY DEPT Provider Note   CSN: [id number] Arrival date & time: 09/24/17  2132     History   Chief Complaint Chief Complaint  Patient presents with  . Abdominal Pain    HPI Courtney [name] is a 27 y.o. female.  HPI 27 year old Caucasian female past medical history significant for hidradenitis, ADHD that presents to the emergency department today for multiple complaints.  Patient reports intermittent left lower quadrant abdominal pain for the past week.  Became constant today.  It worsened earlier today.  She reports that pain is 8/10.  The pain does not radiate.  Patient tried home Percocet that did not help the pain.  Nothing made better or worse.  Reports associated nausea but no vomiting.  Reports subjective fevers and chills with cold sweats.  Reports associated dysuria.  Has hematuria.  Reports vaginal discharge.  The discharge has a foul odor.  Patient denies being sexually active.  Denies concern for STD.  Reports she was seen recently for a kidney stone and she reports vaginal discharge is present with her kidney stones.  She concerned that she may have an additional kidney stone.  Nuys any vaginal bleeding.  Denies any change in her bowel habits.  Patient also states that she has hidradenitis and has concern for draining abscess to her left axilla.  She is seen by dermatologist for this.  She is asking that we take a look at her abscess.  She is on antiiotics for hidradenitis including clindamycin.  Pt denies any  chill, ha, vision changes, lightheadedness, dizziness, congestion, neck pain, cp, sob, cough, change in bowel habits, melena, hematochezia, lower extremity paresthesias.  Past Medical History:  Diagnosis Date  . ADHD (attention deficit hyperactivity disorder)   . Chronic tonsillitis 08/2013   current strep, will finish antibiotic 09/04/2013; snores during sleep, mother denies apnea  . Family history of adverse reaction to anesthesia    " My cousin had problems waking up. "  . Heart murmur    "when I was born; outgrew it; I was a preemie"  . Hidradenitis   . Migraine 12/19/2016   "I've just had this one for 3 days" (12/20/2016)    Patient Active Problem List   Diagnosis Date Noted  . Left foot pain 04/23/2017  . Axillary hidradenitis suppurativa 04/03/2017  . Axillary abscess 12/20/2016  . Vitamin D deficiency 10/18/2014  . S/P tonsillectomy 09/14/2013  . ADHD (attention deficit hyperactivity disorder) 02/11/2012    Past Surgical History:  Procedure Laterality Date  . AXILLARY HIDRADENITIS EXCISION    . CYSTOSCOPY W/ URETERAL STENT PLACEMENT Right 05/28/2017   Procedure: CYSTOSCOPY WITH RETROGRADE PYELOGRAM/ RIGHT URETERAL STENT PLACEMENT;  Surgeon: Crist Fat, MD;  Location: [facility];  Service: Urology;  Laterality: Right;  . HYDRADENITIS EXCISION Bilateral 04/03/2017   Procedure: EXCISION AND DRAINAGE BILATERAL  HIDRADENITIS AXILLA;  Surgeon: Griselda Miner, MD;  Location: [facility] OR;  Service: General;  Laterality: Bilateral;  . INCISION AND DRAINAGE ABSCESS Right 07/20/2016   Procedure: INCISION AND DRAINAGE ABSCESS right axilla;  Surgeon: Abigail Miyamoto, MD;  [facility];  Service: General;  Laterality: Right;  . INTRAUTERINE DEVICE (IUD) INSERTION  11/2016   "had the one in my left arm removed"  . IRRIGATION AND DEBRIDEMENT ABSCESS Right 12/21/2016   Procedure: IRRIGATION AND DEBRIDEMENT RIGHT AXILLARY HIDRADENITIS;  Surgeon: Andria Meuse, MD;  Location: [facility] OR;  Service: General;  Laterality: Right;  . IRRIGATION AND DEBRIDEMENT ABSCESS Left 01/08/2017  Procedure: IRRIGATION AND DEBRIDEMENT AXILLARY ABSCESS;  Surgeon: Griselda Mineroth, Paul III, MD;  [facility];  Service: General;  Laterality: Left;  . TONSILLECTOMY Bilateral 09/14/2013   Procedure: BILATERAL TONSILLECTOMY;  Surgeon: Serena ColonelJefry Rosen, MD;  Location: [facility];  Service: ENT;  Laterality: Bilateral;  . TONSILLECTOMY    .  TYMPANOPLASTY Bilateral    "rebuilt eardrums"  . WISDOM TOOTH EXTRACTION       OB History   None      Home Medications    Prior to Admission medications   Medication Sig Start Date End Date Taking? Authorizing Provider  Adalimumab (HUMIRA) 40 MG/0.4ML PSKT Inject 40 mg into the skin every Saturday.    [provider]  cephALEXin (KEFLEX) 500 MG capsule Take 1 capsule (500 mg total) by mouth 2 (two) times daily. 09/25/17   Rise MuLeaphart, Kenneth T, PA-C  ciprofloxacin (CIPRO) 500 MG tablet Take 1 tablet (500 mg total) by mouth 2 (two) times daily. 05/28/17   Crist FatHerrick, Benjamin W, MD  clindamycin (CLEOCIN) 300 MG capsule Take 300 mg by mouth daily as needed (When she feels like she needs it).    [provider]  diphenhydrAMINE (BENADRYL) 25 mg capsule Take 25 mg by mouth every 6 (six) hours as needed for itching or allergies.    [provider]  EPINEPHrine 0.3 mg/0.3 mL IJ SOAJ injection Inject 0.3 mg into the muscle as needed (for allergic reaction).    [provider]  ibuprofen (ADVIL,MOTRIN) 800 MG tablet Take 1 tablet (800 mg total) by mouth every 8 (eight) hours as needed. 04/09/17   Ward, Chase PicketJaime Pilcher, PA-C  Levonorgestrel [facility](KYLEENA) 19.5 MG IUD Kyleena 17.5 mcg/24 hour (5 years) intrauterine device  Take 1 device by intrauterine route.    [provider]  methylphenidate Lezlie Octave(DAYTRANA) 20 MG/9HR Place 2 patches onto the skin daily. wear patch for 9 hours only each day 05/23/17   Dianne DunAron, Talia M, MD  montelukast (SINGULAIR) 10 MG tablet TAKE 1 TABLET AT BEDTIME Patient taking differently: Take 10 mg by mouth at bedtime as needed for allergies 02/06/16   Dianne DunAron, Talia M, MD  mupirocin ointment (BACTROBAN) 2 % APPLY TO LESION 1 TO 2 TIMES A DAY 03/31/17   [provider]  ondansetron (ZOFRAN ODT) 4 MG disintegrating tablet Take 1 tablet (4 mg total) by mouth every 8 (eight) hours as needed for nausea or vomiting. 12/24/16   Focht, Joyce CopaJessica L, PA    phenazopyridine (PYRIDIUM) 200 MG tablet Take 1 tablet (200 mg total) by mouth 3 (three) times daily as needed for pain. 05/28/17   Crist FatHerrick, Benjamin W, MD  spironolactone (ALDACTONE) 100 MG tablet Take 1 tablet by mouth daily. 04/22/17   [provider]  traMADol (ULTRAM) 50 MG tablet Take 1-2 tablets (50-100 mg total) by mouth every 6 (six) hours as needed for moderate pain. 05/28/17   Crist FatHerrick, Benjamin W, MD    Family History Family History  Problem Relation Age of Onset  . Hypertension Mother   . Hyperlipidemia Mother     Social History Social History   Tobacco Use  . Smoking status: Never Smoker  . Smokeless tobacco: Never Used  Substance Use Topics  . Alcohol use: Yes    Comment: occasional  . Drug use: No     Allergies   Other; Shellfish allergy; Tapentadol; Tape; Morphine and related; Iodine; and Vancomycin   Review of Systems Review of Systems  All other systems reviewed and are negative.  Physical Exam Updated Vital Signs BP 100/70 (BP Location: Right Arm)   Pulse 78   Temp 97.6 F (36.4 C) (Oral)   Resp 18   SpO2 100%   Physical Exam  Constitutional: She is oriented to person, place, and time. She appears well-developed and well-nourished.  Non-toxic appearance. No distress.  HENT:  Head: Normocephalic and atraumatic.  Nose: Nose normal.  Mouth/Throat: Oropharynx is clear and moist.  Eyes: Pupils are equal, round, and reactive to light. Conjunctivae are normal. Right eye exhibits no discharge. Left eye exhibits no discharge.  Neck: Normal range of motion. Neck supple.  Cardiovascular: Normal rate, regular rhythm, normal heart sounds and intact distal pulses. Exam reveals no gallop and no friction rub.  No murmur heard. Pulmonary/Chest: Effort normal and breath sounds normal. No stridor. No respiratory distress. She has no wheezes. She has no rales. She exhibits no tenderness.  Abdominal: Soft. Normal appearance and bowel sounds are normal.  There is tenderness in the left lower quadrant. There is no rigidity, no rebound, no guarding, no CVA tenderness, no tenderness at McBurney's point and negative Murphy's sign.  Musculoskeletal: Normal range of motion. She exhibits no tenderness.  Lymphadenopathy:    She has no cervical adenopathy.  Neurological: She is alert and oriented to person, place, and time.  Skin: Skin is warm and dry. Capillary refill takes less than 2 seconds.  Patient has several scars to left axilla from previous I&D's.  Patient has small area of erythema with purulent drainage.  Tender to palpation.  Fluctuance was noted.  No significant induration.  Psychiatric: Her behavior is normal. Judgment and thought content normal.  Nursing note and vitals reviewed.    ED Treatments / Results  Labs (all labs ordered are listed, but only abnormal results are displayed) Labs Reviewed  WET PREP, GENITAL - Abnormal; Notable for the following components:      Result Value   WBC, Wet Prep HPF POC FEW (*)    All other components within normal limits  COMPREHENSIVE METABOLIC PANEL - Abnormal; Notable for the following components:   Total Protein 8.3 (*)    All other components within normal limits  CBC - Abnormal; Notable for the following components:   WBC 14.6 (*)    All other components within normal limits  URINALYSIS, ROUTINE W REFLEX MICROSCOPIC - Abnormal; Notable for the following components:   APPearance CLOUDY (*)    Leukocytes, UA SMALL (*)    Bacteria, UA RARE (*)    All other components within normal limits  URINE CULTURE  LIPASE, BLOOD  I-STAT BETA HCG BLOOD, ED ([facility], [facility], [facility] ONLY)  GC/CHLAMYDIA PROBE AMP ([place]) NOT AT [facility]    EKG None  Radiology US Transvaginal Non-ob  Result Date: 09/25/2017 CLINICAL DATA:  Left pelvic pain for 1 week EXAM: TRANSABDOMINAL AND TRANSVAGINAL ULTRASOUND OF PELVIS DOPPLER ULTRASOUND OF OVARIES TECHNIQUE: Both transabdominal and transvaginal ultrasound  examinations of the pelvis were performed. Transabdominal technique was performed for global imaging of the pelvis including uterus, ovaries, adnexal regions, and pelvic cul-de-sac. It was necessary to proceed with endovaginal exam following the transabdominal exam to visualize the ovaries and endometrium. Color and duplex Doppler ultrasound was utilized to evaluate blood flow to the ovaries. COMPARISON:  CT abdomen and pelvis 09/25/2017 FINDINGS: Uterus Measurements: 7.3 x 2.3 x 3.7 cm. Uterus is anteverted. No fibroids or other mass visualized. Endometrium Thickness: 6 mm. Echogenic stripe within the endometrium consistent with intrauterine device in appropriate position. Right  ovary Measurements: 2.5 x 2.3 x 3 cm.  Normal appearance/no adnexal mass. Left ovary Measurements: 3.2 x 2 x 2.5 cm.  Normal appearance/no adnexal mass. Pulsed Doppler evaluation of both ovaries demonstrates normal low-resistance arterial and venous waveforms. Other findings No abnormal free fluid. IMPRESSION: Normal ultrasound appearance of the uterus and ovaries. An intrauterine device is present in appropriate position. Electronically Signed   By: Burman Nieves M.D.   On: 09/25/2017 03:58   US Pelvis Complete  Result Date: 09/25/2017 CLINICAL DATA:  Left pelvic pain for 1 week EXAM: TRANSABDOMINAL AND TRANSVAGINAL ULTRASOUND OF PELVIS DOPPLER ULTRASOUND OF OVARIES TECHNIQUE: Both transabdominal and transvaginal ultrasound examinations of the pelvis were performed. Transabdominal technique was performed for global imaging of the pelvis including uterus, ovaries, adnexal regions, and pelvic cul-de-sac. It was necessary to proceed with endovaginal exam following the transabdominal exam to visualize the ovaries and endometrium. Color and duplex Doppler ultrasound was utilized to evaluate blood flow to the ovaries. COMPARISON:  CT abdomen and pelvis 09/25/2017 FINDINGS: Uterus Measurements: 7.3 x 2.3 x 3.7 cm. Uterus is anteverted. No  fibroids or other mass visualized. Endometrium Thickness: 6 mm. Echogenic stripe within the endometrium consistent with intrauterine device in appropriate position. Right ovary Measurements: 2.5 x 2.3 x 3 cm.  Normal appearance/no adnexal mass. Left ovary Measurements: 3.2 x 2 x 2.5 cm.  Normal appearance/no adnexal mass. Pulsed Doppler evaluation of both ovaries demonstrates normal low-resistance arterial and venous waveforms. Other findings No abnormal free fluid. IMPRESSION: Normal ultrasound appearance of the uterus and ovaries. An intrauterine device is present in appropriate position. Electronically Signed   By: Burman Nieves M.D.   On: 09/25/2017 03:58   [place] Art/ven Flow Abd Pelv Doppler  Result Date: 09/25/2017 CLINICAL DATA:  Left pelvic pain for 1 week EXAM: TRANSABDOMINAL AND TRANSVAGINAL ULTRASOUND OF PELVIS DOPPLER ULTRASOUND OF OVARIES TECHNIQUE: Both transabdominal and transvaginal ultrasound examinations of the pelvis were performed. Transabdominal technique was performed for global imaging of the pelvis including uterus, ovaries, adnexal regions, and pelvic cul-de-sac. It was necessary to proceed with endovaginal exam following the transabdominal exam to visualize the ovaries and endometrium. Color and duplex Doppler ultrasound was utilized to evaluate blood flow to the ovaries. COMPARISON:  CT abdomen and pelvis 09/25/2017 FINDINGS: Uterus Measurements: 7.3 x 2.3 x 3.7 cm. Uterus is anteverted. No fibroids or other mass visualized. Endometrium Thickness: 6 mm. Echogenic stripe within the endometrium consistent with intrauterine device in appropriate position. Right ovary Measurements: 2.5 x 2.3 x 3 cm.  Normal appearance/no adnexal mass. Left ovary Measurements: 3.2 x 2 x 2.5 cm.  Normal appearance/no adnexal mass. Pulsed Doppler evaluation of both ovaries demonstrates normal low-resistance arterial and venous waveforms. Other findings No abnormal free fluid. IMPRESSION: Normal ultrasound  appearance of the uterus and ovaries. An intrauterine device is present in appropriate position. Electronically Signed   By: Burman Nieves M.D.   On: 09/25/2017 03:58   Ct Renal Stone Study  Result Date: 09/25/2017 CLINICAL DATA:  Left-sided abdominal pain with nausea EXAM: CT ABDOMEN AND PELVIS WITHOUT CONTRAST TECHNIQUE: Multidetector CT imaging of the abdomen and pelvis was performed following the standard protocol without IV contrast. COMPARISON:  05/28/2017 FINDINGS: Lower chest: No acute abnormality. Hepatobiliary: No focal liver abnormality is seen. No gallstones, gallbladder wall thickening, or biliary dilatation. Pancreas: Unremarkable. No pancreatic ductal dilatation or surrounding inflammatory changes. Spleen: Normal in size without focal abnormality. Adrenals/Urinary Tract: Adrenal glands are unremarkable. Kidneys are normal, without renal calculi, focal lesion,  or hydronephrosis. Bladder is unremarkable. Stomach/Bowel: Stomach is within normal limits. Appendix appears normal. No evidence of bowel wall thickening, distention, or inflammatory changes. Vascular/Lymphatic: No significant vascular findings are present. No enlarged abdominal or pelvic lymph nodes. Reproductive: Intrauterine device in the uterus.  No adnexal mass Other: No abdominal wall hernia or abnormality. No abdominopelvic ascites. Musculoskeletal: No acute or significant osseous findings. IMPRESSION: Negative. No CT evidence for nephrolithiasis, hydronephrosis or ureteral stone Electronically Signed   By: Jasmine Pang M.D.   On: 09/25/2017 02:35    Procedures .Marland KitchenIncision and Drainage Date/Time: 09/25/2017 4:18 AM Performed by: Rise Mu, PA-C Authorized by: Rise Mu, PA-C   Consent:    Consent obtained:  Verbal   Consent given by:  Patient   Risks discussed:  Bleeding, damage to other organs, incomplete drainage, infection and pain   Alternatives discussed:  No treatment Location:    Type:   Abscess   Location: Left axilla. Pre-procedure details:    Skin preparation:  Antiseptic wash Anesthesia (see MAR for exact dosages):    Anesthesia method:  Local infiltration   Local anesthetic:  Lidocaine 1% WITH epi Procedure type:    Complexity:  Simple Procedure details:    Needle aspiration: no     Incision types:  Stab incision   Incision depth:  Dermal   Scalpel blade:  11   Drainage:  Bloody and purulent   Drainage amount:  Moderate   Wound treatment:  Wound left open   Packing materials:  None Post-procedure details:    Patient tolerance of procedure:  Tolerated well, no immediate complications   (including critical care time)  Medications Ordered in ED Medications  lidocaine-EPINEPHrine (XYLOCAINE W/EPI) 2 %-1:200000 (PF) injection 10 mL (has no administration in time range)  cefTRIAXone (ROCEPHIN) 1 g in sodium chloride 0.9 % 100 mL IVPB (0 g Intravenous Stopped 09/25/17 0237)  ketorolac (TORADOL) 30 MG/ML injection 15 mg (15 mg Intravenous Given 09/25/17 0412)  ondansetron (ZOFRAN) injection 4 mg (4 mg Intravenous Given 09/25/17 0412)     Initial Impression / Assessment and Plan / ED Course  I have reviewed the triage vital signs and the nursing notes.  Pertinent labs & imaging results that were available during my care of the patient were reviewed by me and considered in my medical decision making (see chart for details).  Clinical Course as of Sep 26 418  Wed Sep 25, 2017  0419 Total Protein(!): 8.3 [KL]    Clinical Course User Index [KL] Rise Mu, PA-C    Patient resents to the ED with multiple complaints of the left lower quadrant abdominal pain and abscess to left axilla.  She also reports vaginal discharge and dysuria.  Vital signs reassuring.  Patient afebrile.  Does have mild leukocytosis of 14,000.  Otherwise electrolytes are reassuring.  Normal kidney function.  UA shows rare bacteria with small amount of leukocytes is nitrite negative.   Squamous epithelial cells noted.  Wet prep shows few WBCs but no other acute abnormalities.  Pregnancy test was negative.  Gonorrhea and Chlamydia cultures are pending.  No signs of peritonitis on exam.  CT scan performed shows no acute findings.  Patient had a pelvic ultrasound that showed no acute findings as well.  Unknown cause of patient's symptoms.  Did with pain medication in the ED.  Will start patient on Keflex given that she has urinary symptoms.  Gust follow-up with primary care and dermatologist.  Reasons to return the ED  were also discussed.  She is sleeping on my reexamination.  She has no surgical abdomen on reexam and tolerating p.o. Fluids.  Pt is hemodynamically stable, in NAD, & able to ambulate in the ED. Evaluation does not show pathology that would require ongoing emergent intervention or inpatient treatment. I explained the diagnosis to the patient. Pain has been managed & has no complaints prior to dc. Pt is comfortable with above plan and is stable for discharge at this time. All questions were answered prior to disposition. Strict return precautions for f/u to the ED were discussed. Encouraged follow up with PCP.   Final Clinical Impressions(s) / ED Diagnoses   Final diagnoses:  LLQ abdominal pain  Vaginal discharge  Abscess    ED Discharge Orders        Ordered    cephALEXin (KEFLEX) 500 MG capsule  2 times daily     09/25/17 0412       Rise Mu, PA-C 09/25/17 0421    Geoffery Lyons, MD 09/25/17 [phone]

## 2017-09-26 DIAGNOSIS — L732 Hidradenitis suppurativa: Secondary | ICD-10-CM | POA: Diagnosis not present

## 2017-09-26 LAB — URINE CULTURE

## 2017-09-27 DIAGNOSIS — L732 Hidradenitis suppurativa: Secondary | ICD-10-CM | POA: Diagnosis not present

## 2017-09-27 DIAGNOSIS — Z79899 Other long term (current) drug therapy: Secondary | ICD-10-CM | POA: Diagnosis not present

## 2017-09-30 ENCOUNTER — Ambulatory Visit: Admit: 2017-09-30 | Discharge: 2017-10-01 | Payer: PRIVATE HEALTH INSURANCE

## 2017-09-30 DIAGNOSIS — L910 Hypertrophic scar: Secondary | ICD-10-CM

## 2017-09-30 DIAGNOSIS — L732 Hidradenitis suppurativa: Principal | ICD-10-CM

## 2017-09-30 DIAGNOSIS — Z79899 Other long term (current) drug therapy: Secondary | ICD-10-CM

## 2017-09-30 DIAGNOSIS — L0291 Cutaneous abscess, unspecified: Secondary | ICD-10-CM

## 2017-10-03 ENCOUNTER — Telehealth: Payer: Self-pay | Admitting: Family Medicine

## 2017-10-03 MED ORDER — METHYLPHENIDATE 20 MG/9HR TD PTCH: 2.0000 | MEDICATED_PATCH | Freq: Every day | TRANSDERMAL | 0 refills | Status: DC

## 2017-10-03 NOTE — Telephone Encounter (Signed)
Printed for TA to sign with note stating that pt needs appt for future fills/thx dmf

## 2017-10-03 NOTE — Telephone Encounter (Signed)
Copied from CRM [id number]. Topic: Quick Communication - Rx Refill/Question >> Oct 03, 2017  8:53 AM Crist InfanteHarrald, Kathy J wrote: Medication: methylphenidate [facility](DAYTRANA) 20 MG/9HR  2/a day  [facility] [phone]#[id number] [address]- [place], [place] - [address] [phone] (Phone) [phone] (Fax)

## 2017-10-04 ENCOUNTER — Other Ambulatory Visit: Payer: Self-pay | Admitting: Family Medicine

## 2017-10-07 NOTE — Telephone Encounter (Signed)
It is ready for patient to pick up at the office/thx dmf

## 2017-10-14 ENCOUNTER — Other Ambulatory Visit: Payer: Self-pay | Admitting: Family Medicine

## 2017-10-21 DIAGNOSIS — N201 Calculus of ureter: Secondary | ICD-10-CM | POA: Diagnosis not present

## 2017-10-29 ENCOUNTER — Ambulatory Visit: Payer: 59 | Admitting: Family Medicine

## 2017-10-30 ENCOUNTER — Ambulatory Visit: Payer: 59 | Admitting: Family Medicine

## 2017-11-18 ENCOUNTER — Ambulatory Visit (INDEPENDENT_AMBULATORY_CARE_PROVIDER_SITE_OTHER): Payer: 59 | Admitting: Family Medicine

## 2017-11-18 ENCOUNTER — Other Ambulatory Visit: Payer: Self-pay

## 2017-11-18 ENCOUNTER — Encounter: Payer: Self-pay | Admitting: Family Medicine

## 2017-11-18 VITALS — BP 118/76 | HR 97 | Temp 98.4°F | Ht 61.0 in | Wt 175.0 lb

## 2017-11-18 DIAGNOSIS — Z79899 Other long term (current) drug therapy: Secondary | ICD-10-CM

## 2017-11-18 DIAGNOSIS — Z23 Encounter for immunization: Secondary | ICD-10-CM | POA: Diagnosis not present

## 2017-11-18 DIAGNOSIS — F900 Attention-deficit hyperactivity disorder, predominantly inattentive type: Secondary | ICD-10-CM

## 2017-11-18 MED ORDER — ALBUTEROL SULFATE HFA 108 (90 BASE) MCG/ACT IN AERS: 2.0000 | INHALATION_SPRAY | Freq: Four times a day (QID) | RESPIRATORY_TRACT | 0 refills | Status: DC | PRN

## 2017-11-18 MED ORDER — MONTELUKAST SODIUM 10 MG PO TABS: 10.0000 mg | ORAL_TABLET | Freq: Every day | ORAL | 2 refills | Status: DC

## 2017-11-18 MED ORDER — METHYLPHENIDATE 20 MG/9HR TD PTCH: 2.0000 | MEDICATED_PATCH | Freq: Every day | TRANSDERMAL | 0 refills | Status: DC

## 2017-11-18 NOTE — Assessment & Plan Note (Signed)
>  15 minutes spent in face to face time with patient, >50% spent in counselling or coordination of care. Well controlled on current dose of Daytrana. UDS/CSC updated today. Rxs refilled today. The patient indicates understanding of these issues and agrees with the plan.
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
## 2017-11-19 LAB — PAIN MGMT, PROFILE 8 W/CONF, U
6 Acetylmorphine: NEGATIVE ng/mL (ref <10)
ALCOHOL METABOLITES: NEGATIVE ng/mL (ref <500)
Amphetamines: NEGATIVE ng/mL (ref <500)
BENZODIAZEPINES: NEGATIVE ng/mL (ref <100)
BUPRENORPHINE, URINE: NEGATIVE ng/mL (ref <5)
COCAINE METABOLITE: NEGATIVE ng/mL (ref <150)
CREATININE: 40.4 mg/dL
MDMA: NEGATIVE ng/mL (ref <500)
Marijuana Metabolite: NEGATIVE ng/mL (ref <20)
OXIDANT: NEGATIVE ug/mL (ref <200)
Opiates: NEGATIVE ng/mL (ref <100)
Oxycodone: NEGATIVE ng/mL (ref <100)
PH: 6.48 (ref 4.5–9.0)

## 2017-11-22 ENCOUNTER — Encounter: Payer: Self-pay | Admitting: Family Medicine

## 2017-11-25 ENCOUNTER — Ambulatory Visit (INDEPENDENT_AMBULATORY_CARE_PROVIDER_SITE_OTHER): Payer: 59 | Admitting: Family Medicine

## 2017-11-25 ENCOUNTER — Encounter: Payer: Self-pay | Admitting: Family Medicine

## 2017-11-25 ENCOUNTER — Ambulatory Visit (INDEPENDENT_AMBULATORY_CARE_PROVIDER_SITE_OTHER): Payer: 59

## 2017-11-25 VITALS — BP 128/88 | HR 110 | Ht 61.0 in | Wt 170.0 lb

## 2017-11-25 DIAGNOSIS — M25511 Pain in right shoulder: Secondary | ICD-10-CM

## 2017-11-25 DIAGNOSIS — S4991XA Unspecified injury of right shoulder and upper arm, initial encounter: Secondary | ICD-10-CM | POA: Diagnosis not present

## 2017-11-25 NOTE — Progress Notes (Signed)
Courtney [name] - 27 y.o. female MRN [id number]  Date of birth: 02-18-1991  SUBJECTIVE:  Including CC & ROS.  Chief Complaint  Patient presents with  . Shoulder Pain    Courtney [name] is a 27 y.o. female that is presenting with right shoulder pain. She injured her shoulder during a training exercise last week. She fell on her shoulder landed on her air pack then landed on the floor. She also injured the same shoulder shoulder at work, dislocating her shoulder. Pain was worse last week. Certain movements trigger the pain.  She has been taking percocet as needed, was prescribed from her surgery last year for hidradenitis. She has not been back to work since the accident.  She denies any radicular symptoms.  The pain is mild in severity.  She feels that she has no limitations without her arm feels.   Review of Systems  Constitutional: Negative for fever.  HENT: Negative for congestion.   Respiratory: Negative for cough.   Cardiovascular: Negative for chest pain.  Gastrointestinal: Negative for abdominal distention.  Musculoskeletal: Negative for back pain.  Skin: Negative for color change.  Neurological: Negative for weakness.  Hematological: Negative for adenopathy.  Psychiatric/Behavioral: Negative for agitation.    HISTORY: Past Medical, Surgical, Social, and Family History Reviewed & Updated per EMR.   Pertinent Historical Findings include:  Past Medical History:  Diagnosis Date  . ADHD (attention deficit hyperactivity disorder)   . Chronic tonsillitis 08/2013   current strep, will finish antibiotic 09/04/2013; snores during sleep, mother denies apnea  . Family history of adverse reaction to anesthesia    " My cousin had problems waking up. "  . Heart murmur    "when I was born; outgrew it; I was a preemie"  . Hidradenitis   . Migraine 12/19/2016   "I've just had this one for 3 days" (12/20/2016)    Past Surgical History:  Procedure Laterality Date  . AXILLARY HIDRADENITIS  EXCISION    . CYSTOSCOPY W/ URETERAL STENT PLACEMENT Right 05/28/2017   Procedure: CYSTOSCOPY WITH RETROGRADE PYELOGRAM/ RIGHT URETERAL STENT PLACEMENT;  Surgeon: Crist Fat, MD;  Location: [facility];  Service: Urology;  Laterality: Right;  . HYDRADENITIS EXCISION Bilateral 04/03/2017   Procedure: EXCISION AND DRAINAGE BILATERAL  HIDRADENITIS AXILLA;  Surgeon: Griselda Miner, MD;  Location: [facility] OR;  Service: General;  Laterality: Bilateral;  . INCISION AND DRAINAGE ABSCESS Right 07/20/2016   Procedure: INCISION AND DRAINAGE ABSCESS right axilla;  Surgeon: Abigail Miyamoto, MD;  Location: [facility] OR;  Service: General;  Laterality: Right;  . INTRAUTERINE DEVICE (IUD) INSERTION  11/2016   "had the one in my left arm removed"  . IRRIGATION AND DEBRIDEMENT ABSCESS Right 12/21/2016   Procedure: IRRIGATION AND DEBRIDEMENT RIGHT AXILLARY HIDRADENITIS;  Surgeon: Andria Meuse, MD;  Location: [facility] OR;  Service: General;  Laterality: Right;  . IRRIGATION AND DEBRIDEMENT ABSCESS Left 01/08/2017   Procedure: IRRIGATION AND DEBRIDEMENT AXILLARY ABSCESS;  Surgeon: Griselda Miner, MD;  Location: [facility] OR;  Service: General;  Laterality: Left;  . TONSILLECTOMY Bilateral 09/14/2013   Procedure: BILATERAL TONSILLECTOMY;  Surgeon: Serena Colonel, MD;  Location:  SUR[facility]ERY CENTER;  Service: ENT;  Laterality: Bilateral;  . TONSILLECTOMY    . TYMPANOPLASTY Bilateral    "rebuilt eardrums"  . WISDOM TOOTH EXTRACTION      Allergies  Allergen Reactions  . Other Hives    ABD pad causes rash Pt can only tolerate cloth tape  . Shellfish Allergy Anaphylaxis  .  Tapentadol Hives and Other (See Comments)    Pt can only tolerate cloth tape  . Tape Hives and Other (See Comments)    Pt can only tolerate cloth tape  . Morphine And Related     " I become very angry"  . Iodine Nausea Only  . Vancomycin Other (See Comments)    Burns her vein really bad and always causes them to blow.    Family History  Problem  Relation Age of Onset  . Hypertension Mother   . Hyperlipidemia Mother      Social History   Socioeconomic History  . Marital status: Single    Spouse name: Not on file  . Number of children: Not on file  . Years of education: Not on file  . Highest education level: Not on file  Occupational History  . Not on file  Social Needs  . Financial resource strain: Not on file  . Food insecurity:    Worry: Not on file    Inability: Not on file  . Transportation needs:    Medical: Not on file    Non-medical: Not on file  Tobacco Use  . Smoking status: Never Smoker  . Smokeless tobacco: Never Used  Substance and Sexual Activity  . Alcohol use: Yes    Comment: occasional  . Drug use: No  . Sexual activity: Not on file  Lifestyle  . Physical activity:    Days per week: Not on file    Minutes per session: Not on file  . Stress: Not on file  Relationships  . Social connections:    Talks on phone: Not on file    Gets together: Not on file    Attends religious service: Not on file    Active member of club or organization: Not on file    Attends meetings of clubs or organizations: Not on file    Relationship status: Not on file  . Intimate partner violence:    Fear of current or ex partner: Not on file    Emotionally abused: Not on file    Physically abused: Not on file    Forced sexual activity: Not on file  Other Topics Concern  . Not on file  Social History Narrative   Attends [employer]- getting a degree in business and criminal justice     PHYSICAL EXAM:  VS: BP 128/88   Pulse (!) 110   Ht 5\' 1"  (1.549 m)   Wt 170 lb (77.1 kg)   SpO2 99%   BMI 32.12 kg/m  Physical Exam Gen: NAD, alert, cooperative with exam, well-appearing ENT: normal lips, normal nasal mucosa,  Eye: normal EOM, normal conjunctiva and lids CV:  no edema, +2 pedal pulses   Resp: no accessory muscle use, non-labored,   Skin: no rashes, no areas of induration  Neuro: normal  tone, normal sensation to touch Psych:  normal insight, alert and oriented MSK:  Right Shoulder: Inspection reveals no abnormalities, atrophy or asymmetry. Palpation is normal with no tenderness over AC joint or bicipital groove. ROM is full in all planes. Rotator cuff strength normal throughout. negative Hawkin's tests, empty can sign. Speeds tests normal.  negative Obrien's No painful arc and no drop arm sign. Neurovascularly intact   Limited ultrasound: Right shoulder:  Normal appearing BT, subscap and supraspinatus  Normal AC joint   Summary: normal exam   Ultrasound and interpretation by Clare GandyJeremy Aneesha Holloran, MD        ASSESSMENT & PLAN:  Acute pain of right shoulder Has little to no pain today.  Clinical exam is reassuring and ultrasound did not demonstrate any abnormalities. -X-ray today. -Provided a work note with no restrictions

## 2017-11-25 NOTE — Patient Instructions (Signed)
Nice to meet you  I will call you with the results from today.

## 2017-11-26 ENCOUNTER — Telehealth: Payer: Self-pay | Admitting: Family Medicine

## 2017-11-26 NOTE — Telephone Encounter (Signed)
Left VM for patient. If she calls back please have her speak with a nurse/CMA and inform that her xray is normal. The [facility] can report results to patient.   If any questions then please take the best time and phone number to call and I will try to call her back.   Myra RudeSchmitz, Jeremy E, MD [facility] 11/26/2017, 5:50 PM

## 2017-11-26 NOTE — Assessment & Plan Note (Signed)
Has little to no pain today.  Clinical exam is reassuring and ultrasound did not demonstrate any abnormalities. -X-ray today. -Provided a work note with no restrictions

## 2017-11-27 ENCOUNTER — Ambulatory Visit: Admit: 2017-11-27 | Discharge: 2017-11-28 | Payer: PRIVATE HEALTH INSURANCE

## 2017-11-27 DIAGNOSIS — L732 Hidradenitis suppurativa: Principal | ICD-10-CM

## 2017-12-18 ENCOUNTER — Telehealth: Payer: Self-pay | Admitting: Family Medicine

## 2017-12-18 ENCOUNTER — Encounter: Admit: 2017-12-18 | Discharge: 2017-12-19 | Payer: PRIVATE HEALTH INSURANCE

## 2017-12-18 DIAGNOSIS — L732 Hidradenitis suppurativa: Principal | ICD-10-CM

## 2017-12-18 MED ORDER — CLINDAMYCIN HCL 300 MG CAPSULE
ORAL_CAPSULE | 0 refills | 0 days | Status: CP
Start: 2017-12-18 — End: ?

## 2017-12-18 MED ORDER — RIFAMPIN 300 MG CAPSULE
ORAL_CAPSULE | 0 refills | 0 days | Status: CP
Start: 2017-12-18 — End: 2018-03-18

## 2017-12-18 NOTE — Telephone Encounter (Signed)
Copied from CRM [id number]. Topic: General - Other >> Dec 18, 2017 10:45 AM Leafy Ro wrote: Reason for CRM:pt mom is calling her daughter needs daytrana patches. [facility] in [place]

## 2017-12-19 MED ORDER — METHYLPHENIDATE 20 MG/9HR TD PTCH: 2.0000 | MEDICATED_PATCH | Freq: Every day | TRANSDERMAL | 0 refills | Status: DC

## 2017-12-19 NOTE — Addendum Note (Signed)
Addended by: Dianne Dun on: 12/19/2017 05:51 PM   Modules accepted: Orders

## 2018-01-20 ENCOUNTER — Encounter: Admit: 2018-01-20 | Discharge: 2018-01-21 | Payer: PRIVATE HEALTH INSURANCE

## 2018-01-20 DIAGNOSIS — Z79899 Other long term (current) drug therapy: Secondary | ICD-10-CM

## 2018-01-20 DIAGNOSIS — L732 Hidradenitis suppurativa: Principal | ICD-10-CM

## 2018-01-20 DIAGNOSIS — L910 Hypertrophic scar: Secondary | ICD-10-CM

## 2018-01-20 MED ORDER — PREDNISONE 10 MG TABLET
ORAL_TABLET | 0 refills | 0 days | Status: CP
Start: 2018-01-20 — End: ?

## 2018-01-20 MED ORDER — ADALIMUMAB SYRINGE CITRATE FREE 40 MG/0.4 ML
11 refills | 0 days | Status: CP
Start: 2018-01-20 — End: 2018-04-10

## 2018-02-24 ENCOUNTER — Other Ambulatory Visit: Payer: Self-pay | Admitting: Family Medicine

## 2018-02-24 MED ORDER — METHYLPHENIDATE 20 MG/9HR TD PTCH: 2.0000 | MEDICATED_PATCH | Freq: Every day | TRANSDERMAL | 0 refills | Status: DC

## 2018-02-24 NOTE — Telephone Encounter (Signed)
TA-LOV 9.16.19/CSC & UDS udated at that visit/Next due for OV in March/PMP ok no red flags/thx dmf

## 2018-02-24 NOTE — Telephone Encounter (Signed)
Copied from CRM [id number]. Topic: Quick Communication - Rx Refill/Question >> Feb 24, 2018 10:01 AM [name], [name] wrote: Medication: Courtney PrudeDAYTRANA 20 MG/9HR [[id number]][[id number]]  Has the patient contacted their pharmacy? Yes  (Agent: If no, request that the patient contact the pharmacy for the refill.) (Agent: If yes, when and what did the pharmacy advise?)  Preferred Pharmacy (with phone number or street name): [id number] [place] , [place] - [address] [place] [id number] Phone: [phone] Fax: [phone]    Agent: Please be advised that RX refills may take up to 3 business days. We ask that you follow-up with your pharmacy.

## 2018-03-10 ENCOUNTER — Telehealth: Payer: Self-pay

## 2018-03-10 NOTE — Telephone Encounter (Signed)
Copied from CRM [id number]. Topic: General - Other >> Mar 10, 2018  3:39 PM Jilda Roche wrote: Reason for CRM: Mom called and states that they have new Insurance that now requires a prior auth for her daytrana patches, please advise.  Best call back is [phone]

## 2018-03-12 NOTE — Telephone Encounter (Signed)
PA pending via covermymeds. [id number]

## 2018-03-13 ENCOUNTER — Telehealth: Payer: Self-pay

## 2018-03-13 DIAGNOSIS — H902 Conductive hearing loss, unspecified: Secondary | ICD-10-CM | POA: Insufficient documentation

## 2018-03-13 DIAGNOSIS — H9012 Conductive hearing loss, unilateral, left ear, with unrestricted hearing on the contralateral side: Secondary | ICD-10-CM | POA: Diagnosis not present

## 2018-03-13 DIAGNOSIS — H6502 Acute serous otitis media, left ear: Secondary | ICD-10-CM | POA: Diagnosis not present

## 2018-03-13 NOTE — Telephone Encounter (Signed)
Copied from CRM [id number]. Topic: General - Other >> Mar 13, 2018 11:41 AM Percival Spanish wrote:  Edman Circle need clincal rationale showing why the pt  need above the quanity limit   and he will fax over the info

## 2018-03-14 NOTE — Telephone Encounter (Signed)
dara calling from [facility] calling to check status. Needs clinical notes as to why pt needs 2 patches a day. Please advise Fax#[phone]

## 2018-03-18 MED ORDER — RIFAMPIN 300 MG CAPSULE
ORAL_CAPSULE | 0 refills | 0 days | Status: CP
Start: 2018-03-18 — End: 2018-04-21

## 2018-03-20 NOTE — Telephone Encounter (Signed)
Pa approved from 03/12/2018 through 03/10/2021. Form faxed back to pharmacy.

## 2018-03-20 NOTE — Telephone Encounter (Signed)
PA approved from 03/12/2018 through 03/10/2021.

## 2018-03-25 ENCOUNTER — Encounter (HOSPITAL_COMMUNITY): Payer: Self-pay | Admitting: Emergency Medicine

## 2018-03-25 ENCOUNTER — Ambulatory Visit (HOSPITAL_COMMUNITY)
Admission: EM | Admit: 2018-03-25 | Discharge: 2018-03-25 | Disposition: A | Discharge status: Home / Self Care | Payer: BLUE CROSS/BLUE SHIELD | Attending: Family Medicine | Admitting: Family Medicine

## 2018-03-25 DIAGNOSIS — B349 Viral infection, unspecified: Secondary | ICD-10-CM | POA: Diagnosis not present

## 2018-03-25 MED ORDER — MOMETASONE FUROATE 50 MCG/ACT NA SUSP: 2.0000 | Freq: Every day | NASAL | 0 refills | Status: DC

## 2018-03-25 MED ORDER — BENZONATATE 200 MG PO CAPS: 200.0000 mg | ORAL_CAPSULE | Freq: Two times a day (BID) | ORAL | 0 refills | Status: DC | PRN

## 2018-03-25 MED ORDER — DM-GUAIFENESIN ER 30-600 MG PO TB12: 1.0000 | ORAL_TABLET | Freq: Two times a day (BID) | ORAL | 0 refills | Status: DC

## 2018-03-25 NOTE — Discharge Instructions (Signed)
Get plenty of rest Push fluids Take the Tessalon plus Mucinex DM every 12 hours for cough and congestion Use a steroid nasal spray like Nasacort daily as directed Expect improvement over next several days

## 2018-03-25 NOTE — ED Provider Notes (Signed)
[facility]    CSN: [id number] Arrival date & time: 03/25/18  1237     History   Chief Complaint Chief Complaint  Patient presents with  . URI    HPI Courtney [name] is a 28 y.o. female.   HPI  Patient is here with a "head cold".  She has cough cold runny nose sinus congestion postnasal drip headache.  She is had some chills but no fever.  She feels very tired.  She has continued to work in spite of her illness.  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  She hopes to go back to work tomorrow.  We discussed that this is likely a virus.  This is something she understands.  She is on clindamycin routinely for hidradenitis skin condition.  She is taking it now.  She states that her immunity is good, but she is on Humira.  Past Medical History:  Diagnosis Date  . ADHD (attention deficit hyperactivity disorder)   . Chronic tonsillitis 08/2013   current strep, will finish antibiotic 09/04/2013; snores during sleep, mother denies apnea  . Family history of adverse reaction to anesthesia    " My cousin had problems waking up. "  . Heart murmur    "when I was born; outgrew it; I was a preemie"  . Hidradenitis   . Migraine 12/19/2016   "I've just had this one for 3 days" (12/20/2016)    Patient Active Problem List   Diagnosis Date Noted  . Acute pain of right shoulder 11/25/2017  . Left foot pain 04/23/2017  . Axillary hidradenitis suppurativa 04/03/2017  . Axillary abscess 12/20/2016  . Vitamin D deficiency 10/18/2014  . S/P tonsillectomy 09/14/2013  . ADHD (attention deficit hyperactivity disorder) 02/11/2012    Past Surgical History:  Procedure Laterality Date  . AXILLARY HIDRADENITIS EXCISION    . CYSTOSCOPY W/ URETERAL STENT PLACEMENT Right 05/28/2017   Procedure: CYSTOSCOPY WITH RETROGRADE PYELOGRAM/ RIGHT URETERAL STENT PLACEMENT;  Surgeon: Crist Fat, MD;  Location: [facility];  Service: Urology;  Laterality: Right;  .  HYDRADENITIS EXCISION Bilateral 04/03/2017   Procedure: EXCISION AND DRAINAGE BILATERAL  HIDRADENITIS AXILLA;  Surgeon: Griselda Miner, MD;  Location: [facility] OR;  Service: General;  Laterality: Bilateral;  . INCISION AND DRAINAGE ABSCESS Right 07/20/2016   Procedure: INCISION AND DRAINAGE ABSCESS right axilla;  Surgeon: Abigail Miyamoto, MD;  Location[facility] OR;  Service: General;  Laterality: Right;  . INTRAUTERINE DEVICE (IUD) INSERTION  11/2016   "had the one in my left arm removed"  . IRRIGATION AND DEBRIDEMENT ABSCESS Right 12/21/2016   Procedure: IRRIGATION AND DEBRIDEMENT RIGHT AXILLARY HIDRADENITIS;  Surgeon: Andria Meuse, MD;  Location: [facility];  Service: General;  Laterality: Right;  . IRRIGATION AND DEBRIDEMENT ABSCESS Left 01/08/2017   Procedure: IRRIGATION AND DEBRIDEMENT AXILLARY ABSCESS;  Surgeon: Griselda Miner, MD;  Location: [facility] OR;  Service: General;  Laterality: Left;  . TONSILLECTOMY Bilateral 09/14/2013   Procedure: BILATERAL TONSILLECTOMY;  Surgeon: Serena Colonel, MD;  Location: [facility];  Service: ENT;  Laterality: Bilateral;  . TONSILLECTOMY    . TYMPANOPLASTY Bilateral    "rebuilt eardrums"  . WISDOM TOOTH EXTRACTION      OB History   No obstetric history on file.      Home Medications    Prior to Admission medications   Medication Sig Start Date End Date Taking? Authorizing Provider  Adalimumab (HUMIRA) 40 MG/0.4ML PSKT Inject 40  mg into the skin every Saturday.    [provider]  albuterol (PROVENTIL HFA;VENTOLIN HFA) 108 (90 Base) MCG/ACT inhaler Inhale 2 puffs into the lungs every 6 (six) hours as needed. 11/18/17   Dianne DunAron, Talia M, MD  benzonatate (TESSALON) 200 MG capsule Take 1 capsule (200 mg total) by mouth 2 (two) times daily as needed for cough. 03/25/18   Eustace MooreNelson, Irvin Bastin Sue, MD  clindamycin (CLEOCIN) 300 MG capsule Take 300 mg by mouth daily as needed (When she feels like she needs it).    [provider]    dextromethorphan-guaiFENesin (MUCINEX DM) 30-600 MG 12hr tablet Take 1 tablet by mouth 2 (two) times daily. 03/25/18   Eustace MooreNelson, Jalynn Waddell Sue, MD  diphenhydrAMINE (BENADRYL) 25 mg capsule Take 25 mg by mouth every 6 (six) hours as needed for itching or allergies.    [provider]  ibuprofen (ADVIL,MOTRIN) 800 MG tablet Take 1 tablet (800 mg total) by mouth every 8 (eight) hours as needed. 04/09/17   Ward, Chase PicketJaime Pilcher, PA-C  Levonorgestrel [facility](KYLEENA) 19.5 MG IUD Kyleena 17.5 mcg/24 hour (5 years) intrauterine device  Take 1 device by intrauterine route.    [provider]  methylphenidate Lezlie Octave(DAYTRANA) 20 MG/9HR Place 2 patches onto the skin daily. wear patch for 9 hours only each day 02/24/18   Dianne DunAron, Talia M, MD  mometasone (NASONEX) 50 MCG/ACT nasal spray Place 2 sprays into the nose daily. 03/25/18   Eustace MooreNelson, Destane Speas Sue, MD  montelukast (SINGULAIR) 10 MG tablet Take 1 tablet (10 mg total) by mouth at bedtime. 11/18/17   Dianne DunAron, Talia M, MD  ondansetron (ZOFRAN ODT) 4 MG disintegrating tablet Take 1 tablet (4 mg total) by mouth every 8 (eight) hours as needed for nausea or vomiting. 12/24/16   Focht, Joyce CopaJessica L, PA  spironolactone (ALDACTONE) 100 MG tablet Take 1 tablet by mouth daily. 04/22/17   [provider]    Family History Family History  Problem Relation Age of Onset  . Hypertension Mother   . Hyperlipidemia Mother   . Diabetes Mother   . CAD Father     Social History Social History   Tobacco Use  . Smoking status: Never Smoker  . Smokeless tobacco: Never Used  Substance Use Topics  . Alcohol use: Yes    Comment: occasional  . Drug use: No     Allergies   Other; Shellfish allergy; Tapentadol; Tape; Morphine and related; Iodine; and Vancomycin   Review of Systems Review of Systems  Constitutional: Positive for chills and fatigue. Negative for fever.  HENT: Positive for congestion, postnasal drip and rhinorrhea. Negative for ear pain and sore throat.    Eyes: Negative for pain and visual disturbance.  Respiratory: Positive for cough. Negative for shortness of breath.   Cardiovascular: Negative for chest pain and palpitations.  Gastrointestinal: Negative for abdominal pain and vomiting.  Genitourinary: Negative for dysuria and hematuria.  Musculoskeletal: Positive for myalgias. Negative for arthralgias and back pain.  Skin: Negative for color change and rash.  Neurological: Negative for seizures and syncope.  All other systems reviewed and are negative.    Physical Exam Triage Vital Signs ED Triage Vitals [03/25/18 1301]  Enc Vitals Group     BP 116/74     Pulse Rate (!) 117     Resp 18     Temp 98.1 F (36.7 C)     Temp Source Oral     SpO2 98 %     Weight  Height      Head Circumference      Peak Flow      Pain Score 1     Pain Loc      Pain Edu?      Excl. in GC?    No data found.  Updated Vital Signs BP 116/74 (BP Location: Left Arm)   Pulse (!) 117   Temp 98.1 F (36.7 C) (Oral)   Resp 18   SpO2 98%   Visual Acuity Right Eye Distance:   Left Eye Distance:   Bilateral Distance:    Right Eye Near:   Left Eye Near:    Bilateral Near:     Physical Exam Constitutional:      General: She is not in acute distress.    Appearance: She is well-developed. She is ill-appearing.  HENT:     Head: Normocephalic and atraumatic.     Right Ear: Tympanic membrane, ear canal and external ear normal.     Left Ear: Tympanic membrane, ear canal and external ear normal.     Nose: Congestion and rhinorrhea present.     Comments: Inflammation of skin around nostrils.  Clear rhinorrhea    Mouth/Throat:     Mouth: Mucous membranes are moist.     Pharynx: Posterior oropharyngeal erythema present.     Comments: Posterior pharynx moderately injected.  No exudate Eyes:     General:        Right eye: No discharge.        Left eye: No discharge.     Conjunctiva/sclera: Conjunctivae normal.     Pupils: Pupils are equal,  round, and reactive to light.  Neck:     Musculoskeletal: Normal range of motion.  Cardiovascular:     Rate and Rhythm: Normal rate and regular rhythm.     Heart sounds: Normal heart sounds.  Pulmonary:     Effort: Pulmonary effort is normal. No respiratory distress.     Breath sounds: Normal breath sounds.  Abdominal:     General: There is no distension.     Palpations: Abdomen is soft.  Musculoskeletal: Normal range of motion.  Lymphadenopathy:     Cervical: Cervical adenopathy present.  Skin:    General: Skin is warm and dry.     Comments: Multiple open and closed comedones noted on face and neck.  At left angle of the jaw there is a 2 cm abscess, indurated, with fluctuant center.  Discussed with patient.  She would like this to open "on its own".  Neurological:     General: No focal deficit present.     Mental Status: She is alert. Mental status is at baseline.  Psychiatric:        Mood and Affect: Mood normal.        Thought Content: Thought content normal.      UC Treatments / Results  Labs (all labs ordered are listed, but only abnormal results are displayed) Labs Reviewed - No data to display  EKG None  Radiology No results found.  Procedures Procedures (including critical care time)  Medications Ordered in UC Medications - No data to display  Initial Impression / Assessment and Plan / UC Course  I have reviewed the triage vital signs and the nursing notes.  Pertinent labs & imaging results that were available during my care of the patient were reviewed by me and considered in my medical decision making (see chart for details).     We reviewed that an  antibiotic will not help her symptoms.  Disposition she is already on clindamycin.  She understands that this is a virus.  We reviewed symptomatic care.  She will return if fails to improve Final Clinical Impressions(s) / UC Diagnoses   Final diagnoses:  Viral syndrome     Discharge Instructions       Get plenty of rest Push fluids Take the Tessalon plus Mucinex DM every 12 hours for cough and congestion Use a steroid nasal spray like Nasacort daily as directed Expect improvement over next several days   ED Prescriptions    Medication Sig Dispense Auth. Provider   benzonatate (TESSALON) 200 MG capsule Take 1 capsule (200 mg total) by mouth 2 (two) times daily as needed for cough. 20 capsule Eustace MooreNelson, Blanca Carreon Sue, MD   mometasone (NASONEX) 50 MCG/ACT nasal spray Place 2 sprays into the nose daily. 17 g Eustace MooreNelson, Sparrow Sanzo Sue, MD   dextromethorphan-guaiFENesin [facility](MUCINEX DM) 30-600 MG 12hr tablet Take 1 tablet by mouth 2 (two) times daily. 20 tablet Eustace MooreNelson, Amiir Heckard Sue, MD     Controlled Substance Prescriptions [place] Controlled Substance Registry consulted? Not Applicable   Eustace MooreNelson, Miliyah Luper Sue, MD 03/25/18 [phone]

## 2018-03-25 NOTE — ED Triage Notes (Signed)
Pt here for URI sx with congestion x 3 days

## 2018-03-27 ENCOUNTER — Ambulatory Visit: Admit: 2018-03-27 | Discharge: 2018-03-28

## 2018-03-27 DIAGNOSIS — L732 Hidradenitis suppurativa: Principal | ICD-10-CM

## 2018-04-02 ENCOUNTER — Telehealth: Payer: Self-pay | Admitting: Family Medicine

## 2018-04-02 NOTE — Telephone Encounter (Signed)
Copied from CRM [id number]. Topic: General - Other >> Apr 02, 2018  2:27 PM Tamela Oddi wrote: Reason for CRM: Patient called to inform the doctor that her insurance, [employer] needs documentation on why the patient needs to take 2 patches a day of her medication methylphenidate (DAYTRANA) 20 MG/9HR.  Please advise and call patient with any questions at [phone]

## 2018-04-02 NOTE — Telephone Encounter (Signed)
Copied from CRM [id number]. Topic: Quick Communication - Rx Refill/Question >> Apr 02, 2018  2:32 PM Tamela Oddi wrote: Medication: methylphenidate [facility]) 20 MG/9HR  Patient called to request a refill for the above medication  Preferred Pharmacy (with phone number or street name): [id number] [address], [place] [address] [phone] (Phone) [phone] (Fax)

## 2018-04-03 MED ORDER — METHYLPHENIDATE 20 MG/9HR TD PTCH: 2.0000 | MEDICATED_PATCH | Freq: Every day | TRANSDERMAL | 0 refills | Status: DC

## 2018-04-03 NOTE — Telephone Encounter (Signed)
Requesting: Daytrana Contract: Yes UDS: UTD Last OV: 9.16.19 Next OV: Not scheduled/not due for OV till March Last Refill: 12.28.2019 [place] PMP: Reviewed/pt compliant no red flags   Please advise in Dr. Elmer Sow absence/she is in a meeting all day/thx dmf

## 2018-04-06 ENCOUNTER — Ambulatory Visit (HOSPITAL_COMMUNITY)
Admission: EM | Admit: 2018-04-06 | Discharge: 2018-04-06 | Disposition: A | Discharge status: Home / Self Care | Payer: BLUE CROSS/BLUE SHIELD | Source: Home / Self Care | Attending: Emergency Medicine | Admitting: Emergency Medicine

## 2018-04-06 ENCOUNTER — Emergency Department (HOSPITAL_COMMUNITY)
Admission: EM | Admit: 2018-04-06 | Discharge: 2018-04-06 | Disposition: A | Discharge status: Home / Self Care | Payer: BLUE CROSS/BLUE SHIELD | Attending: Emergency Medicine | Admitting: Emergency Medicine

## 2018-04-06 ENCOUNTER — Encounter (HOSPITAL_COMMUNITY): Payer: Self-pay | Admitting: Emergency Medicine

## 2018-04-06 ENCOUNTER — Other Ambulatory Visit: Payer: Self-pay

## 2018-04-06 DIAGNOSIS — Z79899 Other long term (current) drug therapy: Secondary | ICD-10-CM | POA: Diagnosis not present

## 2018-04-06 DIAGNOSIS — T7840XA Allergy, unspecified, initial encounter: Secondary | ICD-10-CM | POA: Diagnosis not present

## 2018-04-06 DIAGNOSIS — R220 Localized swelling, mass and lump, head: Secondary | ICD-10-CM

## 2018-04-06 DIAGNOSIS — J029 Acute pharyngitis, unspecified: Secondary | ICD-10-CM | POA: Diagnosis not present

## 2018-04-06 MED ORDER — EPINEPHRINE PF 1 MG/ML IJ SOLN
0.3000 mg | Freq: Once | INTRAMUSCULAR | Status: AC | PRN
  Administered 2018-04-06: 0.3 mg via INTRAMUSCULAR

## 2018-04-06 MED ORDER — EPINEPHRINE 0.3 MG/0.3ML IJ SOAJ: 0.3000 mg | INTRAMUSCULAR | 1 refills | Status: DC | PRN

## 2018-04-06 MED ORDER — FAMOTIDINE 20 MG PO TABS
40.0000 mg | ORAL_TABLET | Freq: Once | ORAL | Status: AC
  Administered 2018-04-06: 40 mg via ORAL

## 2018-04-06 MED ORDER — METHYLPREDNISOLONE SODIUM SUCC 125 MG IJ SOLR
125.0000 mg | Freq: Once | INTRAMUSCULAR | Status: AC
  Administered 2018-04-06: 125 mg via INTRAMUSCULAR

## 2018-04-06 MED ORDER — METHYLPREDNISOLONE SODIUM SUCC 125 MG IJ SOLR
INTRAMUSCULAR | Status: AC
  Filled 2018-04-06: qty 2

## 2018-04-06 MED ORDER — DIPHENHYDRAMINE HCL 25 MG PO CAPS
ORAL_CAPSULE | ORAL | Status: AC
  Filled 2018-04-06: qty 2

## 2018-04-06 MED ORDER — DIPHENHYDRAMINE HCL 25 MG PO CAPS
50.0000 mg | ORAL_CAPSULE | Freq: Once | ORAL | Status: AC
  Administered 2018-04-06: 50 mg via ORAL

## 2018-04-06 MED ORDER — FAMOTIDINE 20 MG PO TABS
ORAL_TABLET | ORAL | Status: AC
  Filled 2018-04-06: qty 2

## 2018-04-06 MED ORDER — EPINEPHRINE PF 1 MG/ML IJ SOLN
INTRAMUSCULAR | Status: AC
  Filled 2018-04-06: qty 1

## 2018-04-06 NOTE — ED Triage Notes (Signed)
Pt states since Friday shes had congestion, states she woke up this morning with her tongue swollen, pt voice is muffled. Pt denies changes in medicine.

## 2018-04-06 NOTE — ED Notes (Signed)
Patient verbalizes understanding of discharge instructions. Opportunity for questioning and answers were provided. Armband removed by staff, pt discharged from ED ambulatory.   

## 2018-04-06 NOTE — ED Triage Notes (Signed)
Onset today woke up at 0600 hard to breath and hard to swollen due to enlarge tongue.  Took benadryl 1 tablet. Went to urgent care who administered epi IM left arm and sent to the ED for evaluation. Airway intact bilateral equal chest rise and fall.

## 2018-04-06 NOTE — ED Provider Notes (Signed)
HPI  SUBJECTIVE:  Courtney [name] is a 28 y.o. female who presents with throbbing, constant painful swelling of her tongue, lip swelling, sensation of her throat swelling shut, difficulty breathing, muffled voice  starting 4 hours ago when she woke up.  She reports shortness of breath.  She also reports a sore throat.  No fevers.  She reports a cough secondary to the nasal congestion, no wheezing, abdominal pain, diarrhea, syncope.  No rash, flushing, urticaria.  No new foods, beverages.  She was just started on Remicade for hidradenitis suppurativa, last infusion on 1/23, she started Mucinex DM on 1/28 for her URI.  she had similar symptoms before when she had an anaphylactic reaction to ginger.  She has a past medical history of anaphylaxis hiradenitis suppurativa.  She does not have hypertension.  She is not on any ACE inhibitors.  No history of angioedema.  Family History negative for angioedema.  LMP: Several weeks ago.  Denies possibility being pregnant.  PMD: Dianne DunAron, Talia M, MD     Past Medical History:  Diagnosis Date  . ADHD (attention deficit hyperactivity disorder)   . Chronic tonsillitis 08/2013   current strep, will finish antibiotic 09/04/2013; snores during sleep, mother denies apnea  . Family history of adverse reaction to anesthesia    " My cousin had problems waking up. "  . Heart murmur    "when I was born; outgrew it; I was a preemie"  . Hidradenitis   . Migraine 12/19/2016   "I've just had this one for 3 days" (12/20/2016)    Past Surgical History:  Procedure Laterality Date  . AXILLARY HIDRADENITIS EXCISION    . CYSTOSCOPY W/ URETERAL STENT PLACEMENT Right 05/28/2017   Procedure: CYSTOSCOPY WITH RETROGRADE PYELOGRAM/ RIGHT URETERAL STENT PLACEMENT;  Surgeon: Crist FatHerrick, Benjamin W, MD;  Location: [facility];  Service: Urology;  Laterality: Right;  . HYDRADENITIS EXCISION Bilateral 04/03/2017   Procedure: EXCISION AND DRAINAGE BILATERAL  HIDRADENITIS AXILLA;  Surgeon: Griselda Mineroth, Paul  III, MD;  [facility];  Service: General;  Laterality: Bilateral;  . INCISION AND DRAINAGE ABSCESS Right 07/20/2016   Procedure: INCISION AND DRAINAGE ABSCESS right axilla;  Surgeon: Abigail MiyamotoBlackman, Douglas, MD;  [facility];  Service: General;  Laterality: Right;  . INTRAUTERINE DEVICE (IUD) INSERTION  11/2016   "had the one in my left arm removed"  . IRRIGATION AND DEBRIDEMENT ABSCESS Right 12/21/2016   Procedure: IRRIGATION AND DEBRIDEMENT RIGHT AXILLARY HIDRADENITIS;  Surgeon: Andria MeuseWhite, Christopher M, MD;  Location[facility];  Service: General;  Laterality: Right;  . IRRIGATION AND DEBRIDEMENT ABSCESS Left 01/08/2017   Procedure: IRRIGATION AND DEBRIDEMENT AXILLARY ABSCESS;  Surgeon: Griselda Mineroth, Paul III, MD;  [facility];  Service: General;  Laterality: Left;  . TONSILLECTOMY Bilateral 09/14/2013   Procedure: BILATERAL TONSILLECTOMY;  Surgeon: Serena ColonelJefry Rosen, MD;  Location: [facility];  Service: ENT;  Laterality: Bilateral;  . TONSILLECTOMY    . TYMPANOPLASTY Bilateral    "rebuilt eardrums"  . WISDOM TOOTH EXTRACTION      Family History  Problem Relation Age of Onset  . Hypertension Mother   . Hyperlipidemia Mother   . Diabetes Mother   . CAD Father     Social History   Tobacco Use  . Smoking status: Never Smoker  . Smokeless tobacco: Never Used  Substance Use Topics  . Alcohol use: Yes    Comment: occasional  . Drug use: No     Current Facility-Administered Medications:  .  diphenhydrAMINE (BENADRYL) capsule  50 mg, 50 mg, Oral, Once, Domenick Gong, MD .  EPINEPHrine (ADRENALIN) 0.3 mg, 0.3 mg, Intramuscular, Once PRN, Domenick Gong, MD .  famotidine (PEPCID) tablet 40 mg, 40 mg, Oral, Once, Domenick Gong, MD .  methylPREDNISolone sodium succinate (SOLU-MEDROL) 125 mg/2 mL injection 125 mg, 125 mg, Intramuscular, Once, Domenick Gong, MD  Current Outpatient Medications:  .  inFLIXimab (REMICADE IV), Inject into the vein., Disp: , Rfl:  .  Adalimumab  (HUMIRA) 40 MG/0.4ML PSKT, Inject 40 mg into the skin every Saturday., Disp: , Rfl:  .  albuterol (PROVENTIL HFA;VENTOLIN HFA) 108 (90 Base) MCG/ACT inhaler, Inhale 2 puffs into the lungs every 6 (six) hours as needed., Disp: 1 Inhaler, Rfl: 0 .  benzonatate (TESSALON) 200 MG capsule, Take 1 capsule (200 mg total) by mouth 2 (two) times daily as needed for cough., Disp: 20 capsule, Rfl: 0 .  clindamycin (CLEOCIN) 300 MG capsule, Take 300 mg by mouth daily as needed (When she feels like she needs it)., Disp: , Rfl:  .  dextromethorphan-guaiFENesin (MUCINEX DM) 30-600 MG 12hr tablet, Take 1 tablet by mouth 2 (two) times daily., Disp: 20 tablet, Rfl: 0 .  diphenhydrAMINE (BENADRYL) 25 mg capsule, Take 25 mg by mouth every 6 (six) hours as needed for itching or allergies., Disp: , Rfl:  .  ibuprofen (ADVIL,MOTRIN) 800 MG tablet, Take 1 tablet (800 mg total) by mouth every 8 (eight) hours as needed., Disp: 21 tablet, Rfl: 0 .  Levonorgestrel (KYLEENA) 19.5 MG IUD, Kyleena 17.5 mcg/24 hour (5 years) intrauterine device  Take 1 device by intrauterine route., Disp: , Rfl:  .  methylphenidate (DAYTRANA) 20 MG/9HR, Place 2 patches onto the skin daily. wear patch for 9 hours only each day, Disp: 60 patch, Rfl: 0 .  mometasone (NASONEX) 50 MCG/ACT nasal spray, Place 2 sprays into the nose daily., Disp: 17 g, Rfl: 0 .  montelukast (SINGULAIR) 10 MG tablet, Take 1 tablet (10 mg total) by mouth at bedtime., Disp: 90 tablet, Rfl: 2 .  ondansetron (ZOFRAN ODT) 4 MG disintegrating tablet, Take 1 tablet (4 mg total) by mouth every 8 (eight) hours as needed for nausea or vomiting., Disp: 20 tablet, Rfl: 0 .  spironolactone (ALDACTONE) 100 MG tablet, Take 1 tablet by mouth daily., Disp: , Rfl:   Allergies  Allergen Reactions  . Other Hives    ABD pad causes rash Pt can only tolerate cloth tape  . Shellfish Allergy Anaphylaxis  . Tapentadol Hives and Other (See Comments)    Pt can only tolerate cloth tape  . Tape  Hives and Other (See Comments)    Pt can only tolerate cloth tape  . Morphine And Related     " I become very angry"  . Iodine Nausea Only  . Vancomycin Other (See Comments)    Burns her vein really bad and always causes them to blow.     ROS  As noted in HPI.   Physical Exam  BP 118/66   Pulse 90   Temp 98.1 F (36.7 C)   Resp 18   SpO2 96%   Constitutional: Well developed, well nourished, no acute distress Eyes:  EOMI, conjunctiva normal bilaterally HENT: Normocephalic, atraumatic,mucus membranes moist no obvious angioedema although she states that her lips are bigger than usual.  Diffuse tenderness over the tongue.  Unable to completely visualize posterior oropharynx although I am able to see the uvula which appears normal.  Positive muffled voice.  No drooling, stridor. Neck: No cervical  lymphadenopathy.  Pain with neck flexion Respiratory: Normal inspiratory effort air movement, no wheezing Cardiovascular: Normal rate regular rhythm no murmur rubs or gallops GI: nondistended, active bowel sounds skin: No urticaria or flushing of her torso, or extremities Musculoskeletal: no deformities Neurologic: Alert & oriented x 3, no focal neuro deficits Psychiatric: Speech and behavior appropriate   ED Course   Medications  methylPREDNISolone sodium succinate (SOLU-MEDROL) 125 mg/2 mL injection 125 mg (has no administration in time range)  famotidine (PEPCID) tablet 40 mg (has no administration in time range)  diphenhydrAMINE (BENADRYL) capsule 50 mg (has no administration in time range)  EPINEPHrine (ADRENALIN) 0.3 mg (has no administration in time range)    No orders of the defined types were placed in this encounter.   No results found for this or any previous visit (from the past 24 hour(s)). No results found.  ED Clinical Impression  Tongue swelling  Allergic reaction, initial encounter   ED Assessment/Plan  Concern for early anaphylaxis.  She does have a  sore throat, muffled voice, pain with neck flexion, epiglottitis also in the differential, but feel that this is less likely.  Giving epinephrine 0.3 mg 1:1000 IM, Benadryl 50 mg, Solu-Medrol 125 mg IM, Pepcid 40 mg, transferring to the ER for observation. Staff will walk her down.  Meds ordered this encounter  Medications  . methylPREDNISolone sodium succinate (SOLU-MEDROL) 125 mg/2 mL injection 125 mg  . famotidine (PEPCID) tablet 40 mg  . diphenhydrAMINE (BENADRYL) capsule 50 mg  . EPINEPHrine (ADRENALIN) 0.3 mg    *This clinic note was created using Scientist, clinical (histocompatibility and immunogenetics). Therefore, there may be occasional mistakes despite careful proofreading.   ?   Domenick Gong, MD 04/06/18 [phone]

## 2018-04-06 NOTE — Discharge Instructions (Addendum)
I am concerned that you could be having an early anaphylactic reaction.  I am giving you epinephrine, Benadryl, Solu-Medrol and Pepcid here but you need to go to the emergency department immediately for evaluation.  We will call the ED staff and let them know that you are on your way.

## 2018-04-06 NOTE — ED Provider Notes (Signed)
[facility]ENC[facility] Provider Note   CSN: [id number] Arrival date & time: 04/06/18  1118     History   Chief Complaint Chief Complaint  Patient presents with  . Allergic Reaction    HPI Courtney [name] is a 28 y.o. female.  The history is provided by the patient.  Allergic Reaction  Presenting symptoms: difficulty breathing and swelling   Presenting symptoms: no difficulty swallowing and no rash   Severity:  Mild Prior allergic episodes:  Unable to specify Context: not animal exposure, not dairy/milk products, not food allergies, not grass, not medications and not new detergents/soaps   Relieved by:  Steroids, epinephrine and antihistamines Worsened by:  Nothing   Past Medical History:  Diagnosis Date  . ADHD (attention deficit hyperactivity disorder)   . Chronic tonsillitis 08/2013   current strep, will finish antibiotic 09/04/2013; snores during sleep, mother denies apnea  . Family history of adverse reaction to anesthesia    " My cousin had problems waking up. "  . Heart murmur    "when I was born; outgrew it; I was a preemie"  . Hidradenitis   . Migraine 12/19/2016   "I've just had this one for 3 days" (12/20/2016)    Patient Active Problem List   Diagnosis Date Noted  . Acute pain of right shoulder 11/25/2017  . Left foot pain 04/23/2017  . Axillary hidradenitis suppurativa 04/03/2017  . Axillary abscess 12/20/2016  . Vitamin D deficiency 10/18/2014  . S/P tonsillectomy 09/14/2013  . ADHD (attention deficit hyperactivity disorder) 02/11/2012    Past Surgical History:  Procedure Laterality Date  . AXILLARY HIDRADENITIS EXCISION    . CYSTOSCOPY W/ URETERAL STENT PLACEMENT Right 05/28/2017   Procedure: CYSTOSCOPY WITH RETROGRADE PYELOGRAM/ RIGHT URETERAL STENT PLACEMENT;  Surgeon: Crist FatHerrick, Benjamin W, MD;  Location: [facility];  Service: Urology;  Laterality: Right;  . HYDRADENITIS EXCISION Bilateral 04/03/2017   Procedure: EXCISION  AND DRAINAGE BILATERAL  HIDRADENITIS AXILLA;  Surgeon: Griselda Mineroth, Paul III, MD;  [facility];  Service: General;  Laterality: Bilateral;  . INCISION AND DRAINAGE ABSCESS Right 07/20/2016   Procedure: INCISION AND DRAINAGE ABSCESS right axilla;  Surgeon: Abigail MiyamotoBlackman, Douglas, MD;  [facility];  Service: General;  Laterality: Right;  . INTRAUTERINE DEVICE (IUD) INSERTION  11/2016   "had the one in my left arm removed"  . IRRIGATION AND DEBRIDEMENT ABSCESS Right 12/21/2016   Procedure: IRRIGATION AND DEBRIDEMENT RIGHT AXILLARY HIDRADENITIS;  Surgeon: Andria MeuseWhite, Christopher M, MD;  Location: [facility] OR;  Service: General;  Laterality: Right;  . IRRIGATION AND DEBRIDEMENT ABSCESS Left 01/08/2017   Procedure: IRRIGATION AND DEBRIDEMENT AXILLARY ABSCESS;  Surgeon: Griselda Mineroth, Paul III, MD;  [facility];  Service: General;  Laterality: Left;  . TONSILLECTOMY Bilateral 09/14/2013   Procedure: BILATERAL TONSILLECTOMY;  Surgeon: Serena ColonelJefry Rosen, MD;  Location: [facility];  Service: ENT;  Laterality: Bilateral;  . TONSILLECTOMY    . TYMPANOPLASTY Bilateral    "rebuilt eardrums"  . WISDOM TOOTH EXTRACTION       OB History   No obstetric history on file.      Home Medications    Prior to Admission medications   Medication Sig Start Date End Date Taking? Authorizing Provider  Adalimumab (HUMIRA) 40 MG/0.4ML PSKT Inject 40 mg into the skin every Saturday.    [provider]  albuterol (PROVENTIL HFA;VENTOLIN HFA) 108 (90 Base) MCG/ACT inhaler Inhale 2 puffs into the lungs every 6 (six) hours as needed. 11/18/17  Dianne Dun, MD  benzonatate (TESSALON) 200 MG capsule Take 1 capsule (200 mg total) by mouth 2 (two) times daily as needed for cough. 03/25/18   Eustace Moore, MD  clindamycin (CLEOCIN) 300 MG capsule Take 300 mg by mouth daily as needed (When she feels like she needs it).    [provider]  dextromethorphan-guaiFENesin (MUCINEX DM) 30-600 MG 12hr tablet Take 1 tablet by  mouth 2 (two) times daily. 03/25/18   Eustace Moore, MD  diphenhydrAMINE (BENADRYL) 25 mg capsule Take 25 mg by mouth every 6 (six) hours as needed for itching or allergies.    [provider]  EPINEPHrine (ADRENACLICK) 0.3 mg/0.3 mL IJ SOAJ injection Inject 0.3 mLs (0.3 mg total) into the muscle as needed for up to 2 doses for anaphylaxis. 04/06/18   Shigeru Lampert, DO  ibuprofen (ADVIL,MOTRIN) 800 MG tablet Take 1 tablet (800 mg total) by mouth every 8 (eight) hours as needed. 04/09/17   Ward, Chase Picket, PA-C  inFLIXimab (REMICADE IV) Inject into the vein.    [provider]  Levonorgestrel (KYLEENA) 19.5 MG IUD Kyleena 17.5 mcg/24 hour (5 years) intrauterine device  Take 1 device by intrauterine route.    [provider]  methylphenidate Lezlie Octave) 20 MG/9HR Place 2 patches onto the skin daily. wear patch for 9 hours only each day 04/03/18   Everrett Coombe, DO  mometasone (NASONEX) 50 MCG/ACT nasal spray Place 2 sprays into the nose daily. 03/25/18   Eustace Moore, MD  montelukast (SINGULAIR) 10 MG tablet Take 1 tablet (10 mg total) by mouth at bedtime. 11/18/17   Dianne Dun, MD  ondansetron (ZOFRAN ODT) 4 MG disintegrating tablet Take 1 tablet (4 mg total) by mouth every 8 (eight) hours as needed for nausea or vomiting. 12/24/16   Focht, Joyce Copa, PA  spironolactone (ALDACTONE) 100 MG tablet Take 1 tablet by mouth daily. 04/22/17   [provider]    Family History Family History  Problem Relation Age of Onset  . Hypertension Mother   . Hyperlipidemia Mother   . Diabetes Mother   . CAD Father     Social History Social History   Tobacco Use  . Smoking status: Never Smoker  . Smokeless tobacco: Never Used  Substance Use Topics  . Alcohol use: Yes    Comment: occasional  . Drug use: No     Allergies   Other; Shellfish allergy; Tapentadol; Tape; Morphine and related; Iodine; and Vancomycin   Review of Systems Review of Systems    Constitutional: Negative for chills and fever.  HENT: Positive for sore throat. Negative for congestion, dental problem, drooling, ear discharge, ear pain, facial swelling, hearing loss, mouth sores, nosebleeds, postnasal drip, rhinorrhea, sinus pressure, sinus pain, sneezing, trouble swallowing and voice change.   Eyes: Negative for pain and visual disturbance.  Respiratory: Negative for cough and shortness of breath.   Cardiovascular: Negative for chest pain and palpitations.  Gastrointestinal: Negative for abdominal pain and vomiting.  Genitourinary: Negative for dysuria and hematuria.  Musculoskeletal: Negative for arthralgias and back pain.  Skin: Negative for color change and rash.  Neurological: Negative for seizures and syncope.  All other systems reviewed and are negative.    Physical Exam Updated Vital Signs  ED Triage Vitals  Enc Vitals Group     BP 04/06/18 1133 122/84     Pulse Rate 04/06/18 1133 82     Resp 04/06/18 1133 16     Temp 04/06/18  1133 98.4 F (36.9 C)     Temp Source 04/06/18 1133 Oral     SpO2 04/06/18 1133 100 %     Weight 04/06/18 1135 175 lb (79.4 kg)     Height 04/06/18 1135 5\' 1"  (1.549 m)     Head Circumference --      Peak Flow --      Pain Score 04/06/18 1135 0     Pain Loc --      Pain Edu? --      Excl. in GC? --      Physical Exam Vitals signs and nursing note reviewed.  Constitutional:      General: She is not in acute distress.    Appearance: She is well-developed.  HENT:     Head: Normocephalic and atraumatic.     Nose: Nose normal. No congestion or rhinorrhea.     Mouth/Throat:     Mouth: Mucous membranes are moist.     Pharynx: No oropharyngeal exudate or posterior oropharyngeal erythema.     Comments: Posterior oropharynx is clear, no tongue swelling, no lip swelling, no trismus, no drooling, normal range of motion of the neck Eyes:     Conjunctiva/sclera: Conjunctivae normal.     Pupils: Pupils are equal, round, and  reactive to light.  Neck:     Musculoskeletal: Normal range of motion and neck supple. No muscular tenderness.  Cardiovascular:     Rate and Rhythm: Normal rate and regular rhythm.     Pulses: Normal pulses.     Heart sounds: Normal heart sounds. No murmur.  Pulmonary:     Effort: Pulmonary effort is normal. No respiratory distress.     Breath sounds: Normal breath sounds.  Abdominal:     General: There is no distension.     Palpations: Abdomen is soft.     Tenderness: There is no abdominal tenderness.  Musculoskeletal: Normal range of motion.  Skin:    General: Skin is warm and dry.     Capillary Refill: Capillary refill takes less than 2 seconds.     Comments: No rash, no hives  Neurological:     General: No focal deficit present.     Mental Status: She is alert.  Psychiatric:        Mood and Affect: Mood normal.        Behavior: Behavior normal.      ED Treatments / Results  Labs (all labs ordered are listed, but only abnormal results are displayed) Labs Reviewed - No data to display  EKG None  Radiology No results found.  Procedures Procedures (including critical care time)  Medications Ordered in ED Medications - No data to display   Initial Impression / Assessment and Plan / ED Course  I have reviewed the triage vital signs and the nursing notes.  Pertinent labs & imaging results that were available during my care of the patient were reviewed by me and considered in my medical decision making (see chart for details).     Courtney [name] is a 28 year old female who presents to the ED after receiving treatment for anaphylaxis at urgent care.  Patient presented there with normal vitals.  No fever.  States that she has had some sore throat, tongue swelling, lip swelling but patient had no hives, no rash, no GI symptoms.  Patient was treated for anaphylaxis there with epinephrine, Solu-Medrol, Benadryl.  Patient arrives here with no signs of any  respiratory distress, no signs  of ongoing allergic reaction.  She appears to have nasal voice at baseline.  She has had upper respiratory infection for the last several days as well.  No new medications.  She does mention starting Remicade infusion recently but that was about 2 weeks ago.  Patient does have allergy to ginger and iodine but denies any recent exposures.  She is not on any ACE inhibitors.  There is no history of angioedema or family history of that.  Patient has no wheezing on exam.  No GI symptoms.  Overall suspect that patient likely had symptoms initially of likely URI symptoms.  There is no concern for epiglottitis or retropharyngeal or peritonsillar abscess on exam.  Patient does not have any tonsils.  She overall is very well-appearing.  Will observe  and reevaluate and likely discharge to home with Benadryl as needed.  Will prescribe epinephrine pen if needed and have her follow-up with allergist for possible skin testing.    Patient asymptomatic throughout my care.  Was discharged to home with epinephrine pen.  Given information to follow-up with allergy.  Discharged in good condition.  This chart was dictated using voice recognition software.  Despite best efforts to proofread,  errors can occur which can change the documentation meaning.    Final Clinical Impressions(s) / ED Diagnoses   Final diagnoses:  Allergic reaction, initial encounter    ED Discharge Orders         Ordered    EPINEPHrine (ADRENACLICK) 0.3 mg/0.3 mL IJ SOAJ injection  As needed     04/06/18 1351           [place], DO 04/06/18 1354

## 2018-04-07 MED ORDER — METHYLPHENIDATE 20 MG/9HR TD PTCH: 1.0000 | MEDICATED_PATCH | Freq: Every day | TRANSDERMAL | 0 refills | Status: DC

## 2018-04-07 NOTE — Telephone Encounter (Signed)
Yes I will take care of it now.  Thank you! 

## 2018-04-07 NOTE — Telephone Encounter (Signed)
Patient is calling regarding Daytrana refill.  Patient states that the insurance will only pay for one patch. Can a script be written for one patch once a day. Please advise  [facility] [address] [employer] [phone]

## 2018-04-07 NOTE — Telephone Encounter (Signed)
TA-Can you send in an Rx for the Daytrana Patches for 1qd as ins will not cover the bid? Plz advise/thx dmf

## 2018-04-07 NOTE — Addendum Note (Signed)
Addended by: Dianne Dun on: 04/07/2018 06:27 PM   Modules accepted: Orders

## 2018-04-10 ENCOUNTER — Encounter: Admit: 2018-04-10 | Discharge: 2018-04-11 | Payer: PRIVATE HEALTH INSURANCE

## 2018-04-10 DIAGNOSIS — L732 Hidradenitis suppurativa: Principal | ICD-10-CM

## 2018-04-15 ENCOUNTER — Ambulatory Visit (HOSPITAL_COMMUNITY)
Admission: EM | Admit: 2018-04-15 | Discharge: 2018-04-15 | Disposition: A | Discharge status: Home / Self Care | Payer: BLUE CROSS/BLUE SHIELD | Attending: Family Medicine | Admitting: Family Medicine

## 2018-04-15 ENCOUNTER — Other Ambulatory Visit: Payer: Self-pay

## 2018-04-15 ENCOUNTER — Encounter (HOSPITAL_COMMUNITY): Payer: Self-pay | Admitting: Emergency Medicine

## 2018-04-15 DIAGNOSIS — B9789 Other viral agents as the cause of diseases classified elsewhere: Secondary | ICD-10-CM | POA: Diagnosis not present

## 2018-04-15 DIAGNOSIS — J069 Acute upper respiratory infection, unspecified: Secondary | ICD-10-CM

## 2018-04-15 MED ORDER — BENZONATATE 200 MG PO CAPS: 200.0000 mg | ORAL_CAPSULE | Freq: Three times a day (TID) | ORAL | 0 refills | Status: DC | PRN

## 2018-04-15 NOTE — ED Provider Notes (Signed)
[facility]   [id number] 04/15/18 Arrival Time: 0900  ASSESSMENT & PLAN:  1. Viral URI with cough    See AVS for discharge instructions.  Meds ordered this encounter  Medications  . benzonatate (TESSALON) 200 MG capsule    Sig: Take 1 capsule (200 mg total) by mouth 3 (three) times daily as needed for cough.    Dispense:  21 capsule    Refill:  0   School note given. Discussed typical duration of symptoms. OTC symptom care as needed. Ensure adequate fluid intake and rest. May f/u with PCP or here as needed.  Reviewed expectations re: course of current medical issues. Questions answered. Outlined signs and symptoms indicating need for more acute intervention. Patient verbalized understanding. After Visit Summary given.   SUBJECTIVE: History from: patient.  Alveria ApleyBrandy L Pinder is a 28 y.o. female who presents with complaint of mild nasal congestion and a persistent dry cough; without sore throat. Onset abrupt, yesterday; with fatigue and without body aches. SOB: none. Wheezing: none. Fever: yes, subjective with chills. Overall normal PO intake without n/v. Known sick contacts: no. No specific or significant aggravating or alleviating factors reported. OTC treatment: Tussin DM, mild help.  Received flu shot this year: yes.  Social History   Tobacco Use  Smoking Status Never Smoker  Smokeless Tobacco Never Used    ROS: As per HPI. All other systems negative.   OBJECTIVE:  Vitals:   04/15/18 1002  BP: 106/74  Pulse: (!) 123  Resp: 20  Temp: 98.6 F (37 C)  TempSrc: Oral  SpO2: 100%     General appearance: alert; appears fatigued HEENT: nasal congestion; clear runny nose; throat irritation secondary to post-nasal drainage Neck: supple without LAD CV: tachycardic; regular Lungs: unlabored respirations, symmetrical air entry without wheezing; cough: mild Abd: soft Ext: no LE edema Skin: warm and dry Psychological: alert and cooperative; normal mood  and affect   Allergies  Allergen Reactions  . Other Hives    ABD pad causes rash Pt can only tolerate cloth tape  . Shellfish Allergy Anaphylaxis  . Tapentadol Hives and Other (See Comments)    Pt can only tolerate cloth tape  . Tape Hives and Other (See Comments)    Pt can only tolerate cloth tape  . Morphine And Related     " I become very angry"  . Iodine Nausea Only  . Vancomycin Other (See Comments)    Burns her vein really bad and always causes them to blow.    Past Medical History:  Diagnosis Date  . ADHD (attention deficit hyperactivity disorder)   . Chronic tonsillitis 08/2013   current strep, will finish antibiotic 09/04/2013; snores during sleep, mother denies apnea  . Family history of adverse reaction to anesthesia    " My cousin had problems waking up. "  . Heart murmur    "when I was born; outgrew it; I was a preemie"  . Hidradenitis   . Migraine 12/19/2016   "I've just had this one for 3 days" (12/20/2016)   Family History  Problem Relation Age of Onset  . Hypertension Mother   . Hyperlipidemia Mother   . Diabetes Mother   . CAD Father    Social History   Socioeconomic History  . Marital status: Single    Spouse name: Not on file  . Number of children: Not on file  . Years of education: Not on file  . Highest education level: Not on file  Occupational History  .  Not on file  Social Needs  . Financial resource strain: Not on file  . Food insecurity:    Worry: Not on file    Inability: Not on file  . Transportation needs:    Medical: Not on file    Non-medical: Not on file  Tobacco Use  . Smoking status: Never Smoker  . Smokeless tobacco: Never Used  Substance and Sexual Activity  . Alcohol use: Yes    Comment: occasional  . Drug use: No  . Sexual activity: Not on file  Lifestyle  . Physical activity:    Days per week: Not on file    Minutes per session: Not on file  . Stress: Not on file  Relationships  . Social connections:     Talks on phone: Not on file    Gets together: Not on file    Attends religious service: Not on file    Active member of club or organization: Not on file    Attends meetings of clubs or organizations: Not on file    Relationship status: Not on file  . Intimate partner violence:    Fear of current or ex partner: Not on file    Emotionally abused: Not on file    Physically abused: Not on file    Forced sexual activity: Not on file  Other Topics Concern  . Not on file  Social History Narrative   [employer]- getting a degree in business and criminal justice           Mardella LaymanHagler, Hayle Parisi, MD 04/15/18 1018

## 2018-04-15 NOTE — ED Triage Notes (Signed)
Yesterday after class started coughing really bed, chest soreness, coughing causes center chest pain worse.  Patient reports headache started around 7pm last night.

## 2018-04-15 NOTE — ED Notes (Signed)
Patient able to ambulate independently  

## 2018-04-16 ENCOUNTER — Encounter: Payer: Self-pay | Admitting: Family Medicine

## 2018-04-16 ENCOUNTER — Ambulatory Visit: Payer: BLUE CROSS/BLUE SHIELD | Admitting: Family Medicine

## 2018-04-16 ENCOUNTER — Ambulatory Visit (INDEPENDENT_AMBULATORY_CARE_PROVIDER_SITE_OTHER): Payer: BLUE CROSS/BLUE SHIELD

## 2018-04-16 VITALS — BP 122/58 | HR 116 | Temp 98.7°F | Ht 61.0 in | Wt 175.0 lb

## 2018-04-16 DIAGNOSIS — R05 Cough: Secondary | ICD-10-CM | POA: Diagnosis not present

## 2018-04-16 DIAGNOSIS — R059 Cough, unspecified: Secondary | ICD-10-CM

## 2018-04-16 DIAGNOSIS — R509 Fever, unspecified: Secondary | ICD-10-CM | POA: Diagnosis not present

## 2018-04-16 DIAGNOSIS — J101 Influenza due to other identified influenza virus with other respiratory manifestations: Secondary | ICD-10-CM

## 2018-04-16 DIAGNOSIS — J029 Acute pharyngitis, unspecified: Secondary | ICD-10-CM | POA: Diagnosis not present

## 2018-04-16 LAB — POCT INFLUENZA A/B: Influenza A, POC: POSITIVE — AB

## 2018-04-16 MED ORDER — OSELTAMIVIR PHOSPHATE 75 MG PO CAPS: 75.0000 mg | ORAL_CAPSULE | Freq: Two times a day (BID) | ORAL | 0 refills | Status: AC

## 2018-04-16 NOTE — Assessment & Plan Note (Addendum)
-  Positive influenza A, will start tamiflu as she is on immunosuppressant medication for HS -CXR ordered -Recommend increased fluids, symptomatic treatment and rest.    -Discussed secondary infections that can occur with or after flu and that she should let us know if she continues to worsen or gets better and starts to feel worse again.

## 2018-04-16 NOTE — Patient Instructions (Signed)
-We'll be in touch with xray results.  -Start tamiflu -For now be sure that you are drinking plenty of fluids, continue symptomatic treatment   Influenza, Adult Influenza is also called "the flu." It is an infection in the lungs, nose, and throat (respiratory tract). It is caused by a virus. The flu causes symptoms that are similar to symptoms of a cold. It also causes a high fever and body aches. The flu spreads easily from person to person (is contagious). Getting a flu shot (influenza vaccination) every year is the best way to prevent the flu. What are the causes? This condition is caused by the influenza virus. You can get the virus by:  Breathing in droplets that are in the air from the cough or sneeze of a person who has the virus.  Touching something that has the virus on it (is contaminated) and then touching your mouth, nose, or eyes. What increases the risk? Certain things may make you more likely to get the flu. These include:  Not washing your hands often.  Having close contact with many people during cold and flu season.  Touching your mouth, eyes, or nose without first washing your hands.  Not getting a flu shot every year. You may have a higher risk for the flu, along with serious problems such as a lung infection (pneumonia), if you:  Are older than 65.  Are pregnant.  Have a weakened disease-fighting system (immune system) because of a disease or taking certain medicines.  Have a long-term (chronic) illness, such as: ? Heart, kidney, or lung disease. ? Diabetes. ? Asthma.  Have a liver disorder.  Are very overweight (morbidly obese).  Have anemia. This is a condition that affects your red blood cells. What are the signs or symptoms? Symptoms usually begin suddenly and last 4-14 days. They may include:  Fever and chills.  Headaches, body aches, or muscle aches.  Sore throat.  Cough.  Runny or stuffy (congested) nose.  Chest discomfort.  Not  wanting to eat as much as normal (poor appetite).  Weakness or feeling tired (fatigue).  Dizziness.  Feeling sick to your stomach (nauseous) or throwing up (vomiting). How is this treated? If the flu is found early, you can be treated with medicine that can help reduce how bad the illness is and how long it lasts (antiviral medicine). This may be given by mouth (orally) or through an IV tube. Taking care of yourself at home can help your symptoms get better. Your doctor may suggest:  Taking over-the-counter medicines.  Drinking plenty of fluids. The flu often goes away on its own. If you have very bad symptoms or other problems, you may be treated in a hospital. Follow these instructions at home:     Activity  Rest as needed. Get plenty of sleep.  Stay home from work or school as told by your doctor. ? Do not leave home until you do not have a fever for 24 hours without taking medicine. ? Leave home only to visit your doctor. Eating and drinking  Take an ORS (oral rehydration solution). This is a drink that is sold at pharmacies and stores.  Drink enough fluid to keep your pee (urine) pale yellow.  Drink clear fluids in small amounts as you are able. Clear fluids include: ? Water. ? Ice chips. ? Fruit juice that has water added (diluted fruit juice). ? Low-calorie sports drinks.  Eat bland, easy-to-digest foods in small amounts as you are able. These foods  include: ? Bananas. ? Applesauce. ? Rice. ? Lean meats. ? Toast. ? Crackers.  Do not eat or drink: ? Fluids that have a lot of sugar or caffeine. ? Alcohol. ? Spicy or fatty foods. General instructions  Take over-the-counter and prescription medicines only as told by your doctor.  Use a cool mist humidifier to add moisture to the air in your home. This can make it easier for you to breathe.  Cover your mouth and nose when you cough or sneeze.  Wash your hands with soap and water often, especially after you  cough or sneeze. If you cannot use soap and water, use alcohol-based hand sanitizer.  Keep all follow-up visits as told by your doctor. This is important. How is this prevented?   Get a flu shot every year. You may get the flu shot in late summer, fall, or winter. Ask your doctor when you should get your flu shot.  Avoid contact with people who are sick during fall and winter (cold and flu season). Contact a doctor if:  You get new symptoms.  You have: ? Chest pain. ? Watery poop (diarrhea). ? A fever.  Your cough gets worse.  You start to have more mucus.  You feel sick to your stomach.  You throw up. Get help right away if you:  Have shortness of breath.  Have trouble breathing.  Have skin or nails that turn a bluish color.  Have very bad pain or stiffness in your neck.  Get a sudden headache.  Get sudden pain in your face or ear.  Cannot eat or drink without throwing up. Summary  Influenza ("the flu") is an infection in the lungs, nose, and throat. It is caused by a virus.  Take over-the-counter and prescription medicines only as told by your doctor.  Getting a flu shot every year is the best way to avoid getting the flu. This information is not intended to replace advice given to you by your health care provider. Make sure you discuss any questions you have with your health care provider. Document Released: 11/29/2007 Document Revised: 08/07/2017 Document Reviewed: 08/07/2017 Elsevier Interactive Patient Education  2019 [employer].

## 2018-04-16 NOTE — Progress Notes (Signed)
Courtney [name] - 28 y.o. female MRN [id number]  Date of birth: 1991-02-15  Subjective Chief Complaint  Patient presents with  . Cough    [facility]-presecribed Tessalon-feels like she has gotten worse. Admits to SOB. Denies fevers or Body aches.     HPI  Courtney [name] is a 28 y.o. female who complains of congestion, sore throat, nasal blockage, dry cough, myalgias, headache, chills and mild shortness of breath during cough and mild chest discomfort described as sharp for 2 days.  She was seen at urgent care yesterday and diagnosed with viral URI and prescribed tessalon.  Symptoms have worsened today.  She is taking remicade for her HS, on humira prior.  She denies a history of dizziness, nausea, vomiting, weakness and wheezing and denies a history of asthma. Patient does not smoke cigarettes.  ROS:  A comprehensive ROS was completed and negative except as noted per HPI      Allergies  Allergen Reactions  . Other Hives    ABD pad causes rash Pt can only tolerate cloth tape  . Shellfish Allergy Anaphylaxis  . Tapentadol Hives and Other (See Comments)    Pt can only tolerate cloth tape  . Tape Hives and Other (See Comments)    Pt can only tolerate cloth tape  . Morphine And Related     " I become very angry"  . Iodine Nausea Only  . Vancomycin Other (See Comments)    Burns her vein really bad and always causes them to blow.    Past Medical History:  Diagnosis Date  . ADHD (attention deficit hyperactivity disorder)   . Chronic tonsillitis 08/2013   current strep, will finish antibiotic 09/04/2013; snores during sleep, mother denies apnea  . Family history of adverse reaction to anesthesia    " My cousin had problems waking up. "  . Heart murmur    "when I was born; outgrew it; I was a preemie"  . Hidradenitis   . Migraine 12/19/2016   "I've just had this one for 3 days" (12/20/2016)    Past Surgical History:  Procedure Laterality Date  . AXILLARY  HIDRADENITIS EXCISION    . CYSTOSCOPY W/ URETERAL STENT PLACEMENT Right 05/28/2017   Procedure: CYSTOSCOPY WITH RETROGRADE PYELOGRAM/ RIGHT URETERAL STENT PLACEMENT;  Surgeon: Crist FatHerrick, Benjamin W, MD;  Location: [facility];  Service: Urology;  Laterality: Right;  . HYDRADENITIS EXCISION Bilateral 04/03/2017   Procedure: EXCISION AND DRAINAGE BILATERAL  HIDRADENITIS AXILLA;  Surgeon: Griselda Mineroth, Paul III, MD;  [facility];  Service: General;  Laterality: Bilateral;  . INCISION AND DRAINAGE ABSCESS Right 07/20/2016   Procedure: INCISION AND DRAINAGE ABSCESS right axilla;  Surgeon: Abigail MiyamotoBlackman, Douglas, MD;  [facility];  Service: General;  Laterality: Right;  . INTRAUTERINE DEVICE (IUD) INSERTION  11/2016   "had the one in my left arm removed"  . IRRIGATION AND DEBRIDEMENT ABSCESS Right 12/21/2016   Procedure: IRRIGATION AND DEBRIDEMENT RIGHT AXILLARY HIDRADENITIS;  Surgeon: Andria MeuseWhite, Christopher M, MD;  Location: [facility] OR;  Service: General;  Laterality: Right;  . IRRIGATION AND DEBRIDEMENT ABSCESS Left 01/08/2017   Procedure: IRRIGATION AND DEBRIDEMENT AXILLARY ABSCESS;  Surgeon: Griselda Mineroth, Paul III, MD;  [facility];  Service: General;  Laterality: Left;  . TONSILLECTOMY Bilateral 09/14/2013   Procedure: BILATERAL TONSILLECTOMY;  Surgeon: Serena ColonelJefry Rosen, MD;  Location: [facility];  Service: ENT;  Laterality: Bilateral;  . TONSILLECTOMY    . TYMPANOPLASTY Bilateral    "rebuilt eardrums"  .  WISDOM TOOTH EXTRACTION      Social History   Socioeconomic History  . Marital status: Single    Spouse name: Not on file  . Number of children: Not on file  . Years of education: Not on file  . Highest education level: Not on file  Occupational History  . Not on file  Social Needs  . Financial resource strain: Not on file  . Food insecurity:    Worry: Not on file    Inability: Not on file  . Transportation needs:    Medical: Not on file    Non-medical: Not on file  Tobacco Use  . Smoking status:  Never Smoker  . Smokeless tobacco: Never Used  Substance and Sexual Activity  . Alcohol use: Yes    Comment: occasional  . Drug use: No  . Sexual activity: Not on file  Lifestyle  . Physical activity:    Days per week: Not on file    Minutes per session: Not on file  . Stress: Not on file  Relationships  . Social connections:    Talks on phone: Not on file    Gets together: Not on file    Attends religious service: Not on file    Active member of club or organization: Not on file    Attends meetings of clubs or organizations: Not on file    Relationship status: Not on file  Other Topics Concern  . Not on file  Social History Narrative   Attends [employer]- getting a degree in business and criminal justice    Family History  Problem Relation Age of Onset  . Hypertension Mother   . Hyperlipidemia Mother   . Diabetes Mother   . CAD Father     Health Maintenance  Topic Date Due  . PAP-Cervical Cytology Screening  03/01/2018  . PAP SMEAR-Modifier  03/01/2018  . TETANUS/TDAP  12/07/2026  . INFLUENZA VACCINE  Completed  . HIV Screening  Completed    ----------------------------------------------------------------------------------------------------------------------------------------------------------------------------------------------------------------- Physical Exam BP (!) 122/58   Pulse (!) 116   Temp 98.7 F (37.1 C) (Oral)   Ht 5\' 1"  (1.549 m)   Wt 175 lb (79.4 kg)   SpO2 97%   BMI 33.07 kg/m   Physical Exam Constitutional:      Appearance: Normal appearance.  HENT:     Head: Normocephalic and atraumatic.     Right Ear: Tympanic membrane normal.     Left Ear: Tympanic membrane normal.     Nose: Congestion present.     Mouth/Throat:     Mouth: Mucous membranes are moist.  Eyes:     General: No scleral icterus. Neck:     Musculoskeletal: Neck supple.  Cardiovascular:     Rate and Rhythm: Normal rate and regular rhythm.  Pulmonary:       Effort: Pulmonary effort is normal.     Comments: Slightly diminished breath sounds in R base.  Lymphadenopathy:     Cervical: No cervical adenopathy.  Skin:    General: Skin is warm and dry.     Findings: No rash.  Neurological:     General: No focal deficit present.     Mental Status: She is alert.  Psychiatric:        Mood and Affect: Mood normal.        Behavior: Behavior normal.     ------------------------------------------------------------------------------------------------------------------------------------------------------------------------------------------------------------------- Assessment and Plan  Influenza A -Positive influenza A, will start tamiflu as she is on immunosuppressant medication for  HS -CXR ordered -Recommend increased fluids, symptomatic treatment and rest.    -Discussed secondary infections that can occur with or after flu and that she should let us know if she continues to worsen or gets better and starts to feel worse again.

## 2018-04-21 MED ORDER — RIFAMPIN 300 MG CAPSULE
ORAL_CAPSULE | 0 refills | 0 days | Status: CP
Start: 2018-04-21 — End: ?

## 2018-05-08 ENCOUNTER — Encounter: Admit: 2018-05-08 | Discharge: 2018-05-09 | Payer: PRIVATE HEALTH INSURANCE

## 2018-05-08 DIAGNOSIS — L732 Hidradenitis suppurativa: Principal | ICD-10-CM

## 2018-05-13 ENCOUNTER — Ambulatory Visit: Payer: BLUE CROSS/BLUE SHIELD | Admitting: Family Medicine

## 2018-05-13 ENCOUNTER — Encounter: Payer: Self-pay | Admitting: Family Medicine

## 2018-05-13 VITALS — BP 110/74 | HR 108 | Temp 98.2°F | Ht 61.0 in | Wt 174.4 lb

## 2018-05-13 DIAGNOSIS — F900 Attention-deficit hyperactivity disorder, predominantly inattentive type: Secondary | ICD-10-CM | POA: Diagnosis not present

## 2018-05-13 MED ORDER — METHYLPHENIDATE 20 MG/9HR TD PTCH: 1.0000 | MEDICATED_PATCH | Freq: Every day | TRANSDERMAL | 0 refills | Status: DC

## 2018-05-13 NOTE — Progress Notes (Signed)
Subjective:   Patient ID: [name], female    DOB: 03/20/1990, 28 y.o.   MRN: [id number]  TENEIL KNAPPE is a pleasant 28 y.o. year old female who presents to clinic today with Medication Refill (daytrana refill/ wants two boxes approved/ also wants to talk about scheduling CPE)  on 05/13/2018  HPI:  Here to talk about Daytrana patch. Insurance will only pay for one patch per day - 20 mg daily dose. She says that the pharmacy told her the rx can't say DAW, which I do not see it says this on her last rx.  UDS, CSC up to date.    Current Outpatient Medications on File Prior to Visit  Medication Sig Dispense Refill  . albuterol (PROVENTIL HFA;VENTOLIN HFA) 108 (90 Base) MCG/ACT inhaler Inhale 2 puffs into the lungs every 6 (six) hours as needed. 1 Inhaler 0  . diphenhydrAMINE (BENADRYL) 25 mg capsule Take 25 mg by mouth every 6 (six) hours as needed for itching or allergies.    Marland Kitchen EPINEPHrine (ADRENACLICK) 0.3 mg/0.3 mL IJ SOAJ injection Inject 0.3 mLs (0.3 mg total) into the muscle as needed for up to 2 doses for anaphylaxis. 2 Device 1  . ibuprofen (ADVIL,MOTRIN) 200 MG tablet Take 400 mg by mouth every 6 (six) hours as needed for moderate pain.    Marland Kitchen ibuprofen (ADVIL,MOTRIN) 800 MG tablet Take 1 tablet (800 mg total) by mouth every 8 (eight) hours as needed. 21 tablet 0  . inFLIXimab (REMICADE IV) Inject into the vein.    . Levonorgestrel (KYLEENA) 19.5 MG IUD 19.5 mg by Intrauterine route once.     . montelukast (SINGULAIR) 10 MG tablet Take 1 tablet (10 mg total) by mouth at bedtime. 90 tablet 2  . ondansetron (ZOFRAN ODT) 4 MG disintegrating tablet Take 1 tablet (4 mg total) by mouth every 8 (eight) hours as needed for nausea or vomiting. 20 tablet 0  . benzonatate (TESSALON) 200 MG capsule Take 1 capsule (200 mg total) by mouth 3 (three) times daily as needed for cough. (Patient not taking: Reported on 05/13/2018) 21 capsule 0  . clindamycin (CLEOCIN) 300 MG capsule Take 300 mg  by mouth daily as needed (When she feels like she needs it).    . mometasone (NASONEX) 50 MCG/ACT nasal spray Place 2 sprays into the nose daily. (Patient not taking: Reported on 05/13/2018) 17 g 0  . rifampin (RIFADIN) 300 MG capsule Take 300 mg by mouth 2 (two) times daily.    Marland Kitchen spironolactone (ALDACTONE) 100 MG tablet Take 1 tablet by mouth daily.     No current facility-administered medications on file prior to visit.     Allergies  Allergen Reactions  . Other Hives    ABD pad causes rash Pt can only tolerate cloth tape  . Shellfish Allergy Anaphylaxis  . Tapentadol Hives and Other (See Comments)    Pt can only tolerate cloth tape  . Tape Hives and Other (See Comments)    Pt can only tolerate cloth tape  . Morphine And Related     " I become very angry"  . Iodine Nausea Only  . Vancomycin Other (See Comments)    Burns her vein really bad and always causes them to blow.    Past Medical History:  Diagnosis Date  . ADHD (attention deficit hyperactivity disorder)   . Chronic tonsillitis 08/2013   current strep, will finish antibiotic 09/04/2013; snores during sleep, mother denies apnea  . Family history of  adverse reaction to anesthesia    " My cousin had problems waking up. "  . Heart murmur    "when I was born; outgrew it; I was a preemie"  . Hidradenitis   . Migraine 12/19/2016   "I've just had this one for 3 days" (12/20/2016)    Past Surgical History:  Procedure Laterality Date  . AXILLARY HIDRADENITIS EXCISION    . CYSTOSCOPY W/ URETERAL STENT PLACEMENT Right 05/28/2017   Procedure: CYSTOSCOPY WITH RETROGRADE PYELOGRAM/ RIGHT URETERAL STENT PLACEMENT;  Surgeon: Crist Fat, MD;  Location: [facility];  Service: Urology;  Laterality: Right;  . HYDRADENITIS EXCISION Bilateral 04/03/2017   Procedure: EXCISION AND DRAINAGE BILATERAL  HIDRADENITIS AXILLA;  Surgeon: Griselda Miner, MD;  [facility];  Service: General;  Laterality: Bilateral;  . INCISION AND DRAINAGE  ABSCESS Right 07/20/2016   Procedure: INCISION AND DRAINAGE ABSCESS right axilla;  Surgeon: Abigail Miyamoto, MD;  Location: [facility] OR;  Service: General;  Laterality: Right;  . INTRAUTERINE DEVICE (IUD) INSERTION  11/2016   "had the one in my left arm removed"  . IRRIGATION AND DEBRIDEMENT ABSCESS Right 12/21/2016   Procedure: IRRIGATION AND DEBRIDEMENT RIGHT AXILLARY HIDRADENITIS;  Surgeon: Andria Meuse, MD;  Location: [facility];  Service: General;  Laterality: Right;  . IRRIGATION AND DEBRIDEMENT ABSCESS Left 01/08/2017   Procedure: IRRIGATION AND DEBRIDEMENT AXILLARY ABSCESS;  Surgeon: Griselda Miner, MD;  [facility] OR;  Service: General;  Laterality: Left;  . TONSILLECTOMY Bilateral 09/14/2013   Procedure: BILATERAL TONSILLECTOMY;  Surgeon: Serena Colonel, MD;  Location: [facility];  Service: ENT;  Laterality: Bilateral;  . TONSILLECTOMY    . TYMPANOPLASTY Bilateral    "rebuilt eardrums"  . WISDOM TOOTH EXTRACTION      Family History  Problem Relation Age of Onset  . Hypertension Mother   . Hyperlipidemia Mother   . Diabetes Mother   . CAD Father     Social History   Socioeconomic History  . Marital status: Single    Spouse name: Not on file  . Number of children: Not on file  . Years of education: Not on file  . Highest education level: Not on file  Occupational History  . Not on file  Social Needs  . Financial resource strain: Not on file  . Food insecurity:    Worry: Not on file    Inability: Not on file  . Transportation needs:    Medical: Not on file    Non-medical: Not on file  Tobacco Use  . Smoking status: Never Smoker  . Smokeless tobacco: Never Used  Substance and Sexual Activity  . Alcohol use: Yes    Comment: occasional  . Drug use: No  . Sexual activity: Not on file  Lifestyle  . Physical activity:    Days per week: Not on file    Minutes per session: Not on file  . Stress: Not on file  Relationships  . Social connections:     Talks on phone: Not on file    Gets together: Not on file    Attends religious service: Not on file    Active member of club or organization: Not on file    Attends meetings of clubs or organizations: Not on file    Relationship status: Not on file  . Intimate partner violence:    Fear of current or ex partner: Not on file    Emotionally abused: Not on file    Physically abused: Not  on file    Forced sexual activity: Not on file  Other Topics Concern  . Not on file  Social History Narrative   Attends [employer]- getting a degree in business and criminal justice   The PMH, PSH, Social History, Family History, Medications, and allergies have been reviewed in [facility], and have been updated if relevant.   Review of Systems  Respiratory: Negative.   Cardiovascular: Negative.   Neurological: Negative.   Psychiatric/Behavioral: Negative.   All other systems reviewed and are negative.      Objective:    BP 110/74   Pulse (!) 108   Temp 98.2 F (36.8 C) (Oral)   Ht  (1.549 m)   Wt 174 lb 6.4 oz (79.1 kg)   SpO2 98%   BMI 32.95 kg/m    Physical Exam Vitals signs and nursing note reviewed.  Constitutional:      General: She is not in acute distress.    Appearance: Normal appearance.  HENT:     Head: Normocephalic and atraumatic.     Right Ear: External ear normal.     Left Ear: External ear normal.     Nose: Nose normal.     Mouth/Throat:     Mouth: Mucous membranes are moist.  Eyes:     Extraocular Movements: Extraocular movements intact.  Neck:     Musculoskeletal: Normal range of motion.  Cardiovascular:     Rate and Rhythm: Tachycardia present.     Pulses: Normal pulses.  Pulmonary:     Effort: Pulmonary effort is normal.  Musculoskeletal: Normal range of motion.  Skin:    General: Skin is warm and dry.  Neurological:     General: No focal deficit present.     Mental Status: She is alert and oriented to person, place, and time. Mental  status is at baseline.  Psychiatric:        Mood and Affect: Mood normal.        Behavior: Behavior normal.        Thought Content: Thought content normal.        Judgment: Judgment normal.           Assessment & Plan:   Attention deficit hyperactivity disorder (ADHD), predominantly inattentive type No follow-ups on file.

## 2018-05-13 NOTE — Patient Instructions (Signed)
Great to see you. Schedule a physical on your way out.  If you have any issues with your daytrana patch, let me know.

## 2018-05-13 NOTE — Assessment & Plan Note (Signed)
>  15 minutes spent in face to face time with patient, >50% spent in counselling or coordination of care. PDMP reviewed today- no red flags.  UDS/CSC reviewed and UTD. Reviewed how the rx for Daytrana patch is written before I sent it into pharmacy with patient today. The patient indicates understanding of these issues and agrees with the plan.

## 2018-05-21 DIAGNOSIS — L732 Hidradenitis suppurativa: Principal | ICD-10-CM

## 2018-05-21 MED ORDER — SPIRONOLACTONE 100 MG TABLET
ORAL_TABLET | 3 refills | 0.00000 days | Status: CP
Start: 2018-05-21 — End: 2018-05-28

## 2018-05-26 ENCOUNTER — Encounter: Payer: BLUE CROSS/BLUE SHIELD | Admitting: Family Medicine

## 2018-05-28 ENCOUNTER — Encounter: Admit: 2018-05-28 | Discharge: 2018-05-29 | Payer: PRIVATE HEALTH INSURANCE

## 2018-05-28 DIAGNOSIS — L910 Hypertrophic scar: Principal | ICD-10-CM

## 2018-05-28 DIAGNOSIS — F909 Attention-deficit hyperactivity disorder, unspecified type: Principal | ICD-10-CM

## 2018-05-28 DIAGNOSIS — Z79899 Other long term (current) drug therapy: Principal | ICD-10-CM

## 2018-05-28 DIAGNOSIS — L732 Hidradenitis suppurativa: Principal | ICD-10-CM

## 2018-05-28 MED ORDER — TRAMADOL 50 MG TABLET
ORAL_TABLET | ORAL | 0 refills | 0.00000 days | Status: CP
Start: 2018-05-28 — End: 2018-10-15

## 2018-05-28 MED ORDER — SPIRONOLACTONE 100 MG TABLET
ORAL_TABLET | 3 refills | 0.00000 days | Status: CP
Start: 2018-05-28 — End: ?

## 2018-05-29 ENCOUNTER — Encounter: Payer: BLUE CROSS/BLUE SHIELD | Admitting: Family Medicine

## 2018-06-09 ENCOUNTER — Other Ambulatory Visit: Payer: Self-pay | Admitting: Family Medicine

## 2018-06-09 NOTE — Telephone Encounter (Signed)
Copied from CRM [id number]. Topic: Quick Communication - Rx Refill/Question >> Jun 09, 2018  4:04 PM Gwenlyn Fudge A wrote: Medication: methylphenidate [facility]) 20 MG/9HR  Has the patient contacted their pharmacy? Yes.   (Agent: If no, request that the patient contact the pharmacy for the refill.) (Agent: If yes, when and what did the pharmacy advise?)  Preferred Pharmacy (with phone number or street name): [id number] [place], [place] - [address] [place] [address] Phone: [phone] Fax: [phone] Not a 24 hour pharmacy; exact hours not known.    Agent: Please be advised that RX refills may take up to 3 business days. We ask that you follow-up with your pharmacy.

## 2018-06-10 MED ORDER — METHYLPHENIDATE 20 MG/9HR TD PTCH: 1.0000 | MEDICATED_PATCH | Freq: Every day | TRANSDERMAL | 0 refills | Status: DC

## 2018-06-10 NOTE — Telephone Encounter (Signed)
TA-Pt req Daytrana patches refilled/LOV 3.10.20/CSC & UDS UTD/Per [place] PMP no red flags pt is compliant/last filled on 3.10.20/thx dmf

## 2018-07-03 ENCOUNTER — Encounter: Admit: 2018-07-03 | Discharge: 2018-07-03 | Payer: PRIVATE HEALTH INSURANCE

## 2018-07-03 DIAGNOSIS — L732 Hidradenitis suppurativa: Principal | ICD-10-CM

## 2018-07-08 ENCOUNTER — Other Ambulatory Visit: Payer: Self-pay | Admitting: Family Medicine

## 2018-07-08 MED ORDER — METHYLPHENIDATE 20 MG/9HR TD PTCH: 1.0000 | MEDICATED_PATCH | Freq: Every day | TRANSDERMAL | 0 refills | Status: DC

## 2018-07-08 NOTE — Telephone Encounter (Signed)
TA-LOV: 3.10.20/UDS & CSC UTD/next appt due in Sept/Per [place] PMP pt is compliant without red flags/prepared May, June, July and pended/thx dmf

## 2018-07-08 NOTE — Telephone Encounter (Signed)
Copied from CRM [id number]. Topic: Quick Communication - Rx Refill/Question >> Jul 08, 2018 12:18 PM Angela Nevin wrote: Medication:  methylphenidate [facility]) 20 MG/9HR    Patient is requesting refill.   Preferred Pharmacy (with phone number or street name):[facility] [phone] [place], [place] [address] [phone] (Phone) [phone] (Fax)

## 2018-08-06 ENCOUNTER — Other Ambulatory Visit: Payer: Self-pay | Admitting: Family Medicine

## 2018-08-06 NOTE — Telephone Encounter (Signed)
Copied from CRM [id number]. Topic: Quick Communication - Rx Refill/Question >> Aug 06, 2018 12:46 PM Jaquita Rector A wrote: Medication: methylphenidate [facility]) 20 MG/9HR   Has the patient contacted their pharmacy? Yes.   (Agent: If no, request that the patient contact the pharmacy for the refill.) (Agent: If yes, when and what did the pharmacy advise?)  Preferred Pharmacy (with phone number or street name): [facility] [phone] [place], [place] - [address] [phone] (Phone) [phone] (Fax)    Agent: Please be advised that RX refills may take up to 3 business days. We ask that you follow-up with your pharmacy.

## 2018-08-07 MED ORDER — METHYLPHENIDATE 20 MG/9HR TD PTCH: 1.0000 | MEDICATED_PATCH | Freq: Every day | TRANSDERMAL | 0 refills | Status: DC

## 2018-08-28 ENCOUNTER — Encounter: Admit: 2018-08-28 | Discharge: 2018-08-29 | Payer: PRIVATE HEALTH INSURANCE

## 2018-08-28 DIAGNOSIS — L732 Hidradenitis suppurativa: Principal | ICD-10-CM

## 2018-09-04 ENCOUNTER — Other Ambulatory Visit: Payer: Self-pay | Admitting: Family Medicine

## 2018-09-04 MED ORDER — METHYLPHENIDATE 20 MG/9HR TD PTCH: 1.0000 | MEDICATED_PATCH | Freq: Every day | TRANSDERMAL | 0 refills | Status: DC

## 2018-09-04 MED ORDER — DAYTRANA 20 MG/9HR TD PTCH: 1.0000 | MEDICATED_PATCH | Freq: Every day | TRANSDERMAL | 0 refills | Status: DC

## 2018-09-04 NOTE — Telephone Encounter (Signed)
REFILL methylphenidate [facility]) 20 MG/9HR PHARMACY [id number] [name], [place] [phone] (Phone) [phone] (Fax)

## 2018-09-04 NOTE — Telephone Encounter (Signed)
TA-Pt is needing refill of Daytrana/she is not due for an OV till Sept/CSC & UDS are UTD/per [place] PMP pt is compliant without ref flags/I have prepared and pended them for you to send in/thx dmf

## 2018-10-10 ENCOUNTER — Other Ambulatory Visit: Payer: Self-pay | Admitting: Family Medicine

## 2018-10-10 NOTE — Telephone Encounter (Signed)
Medication Refill: methylphenidate [facility]) 20 MG/9HR [[id number]]    Pharmacy[facility]  [id number] [place], [place] [phone] (Phone) [phone] (Fax)

## 2018-10-14 MED ORDER — METHYLPHENIDATE 20 MG/9HR TD PTCH: 1.0000 | MEDICATED_PATCH | Freq: Every day | TRANSDERMAL | 0 refills | Status: DC

## 2018-10-14 NOTE — Addendum Note (Signed)
Addended by: Rodrigo Ran on: 10/14/2018 09:47 AM   Modules accepted: Orders

## 2018-10-16 MED ORDER — TRAMADOL 50 MG TABLET
ORAL_TABLET | ORAL | 0 refills | 0.00000 days | Status: CP
Start: 2018-10-16 — End: ?

## 2018-10-17 ENCOUNTER — Ambulatory Visit (HOSPITAL_COMMUNITY)
Admission: EM | Admit: 2018-10-17 | Discharge: 2018-10-17 | Disposition: A | Discharge status: Home / Self Care | Payer: BC Managed Care – PPO | Attending: Physician Assistant | Admitting: Physician Assistant

## 2018-10-17 ENCOUNTER — Encounter (HOSPITAL_COMMUNITY): Payer: Self-pay

## 2018-10-17 ENCOUNTER — Other Ambulatory Visit: Payer: Self-pay

## 2018-10-17 DIAGNOSIS — B9689 Other specified bacterial agents as the cause of diseases classified elsewhere: Secondary | ICD-10-CM

## 2018-10-17 DIAGNOSIS — L03313 Cellulitis of chest wall: Secondary | ICD-10-CM | POA: Diagnosis not present

## 2018-10-17 DIAGNOSIS — L02213 Cutaneous abscess of chest wall: Secondary | ICD-10-CM | POA: Diagnosis not present

## 2018-10-17 MED ORDER — SULFAMETHOXAZOLE-TRIMETHOPRIM 800-160 MG PO TABS: 1.0000 | ORAL_TABLET | Freq: Two times a day (BID) | ORAL | 0 refills | Status: DC

## 2018-10-17 MED ORDER — KETOROLAC TROMETHAMINE 60 MG/2ML IM SOLN
60.0000 mg | Freq: Once | INTRAMUSCULAR | Status: AC
  Administered 2018-10-17: 60 mg via INTRAMUSCULAR

## 2018-10-17 MED ORDER — KETOROLAC TROMETHAMINE 60 MG/2ML IM SOLN
INTRAMUSCULAR | Status: AC
  Filled 2018-10-17: qty 2

## 2018-10-17 NOTE — Discharge Instructions (Addendum)
Take medication as prescribed. Follow up with PCP or dermatologist or return here if you have no improvement. Lanced produced small amount of purulent discharge, it may need to be drained again. Take ibuprofen or tylenol every 6 to 8 hours as needed for pain - do not take ibuprofen until tomorrow morning due to Toradol injection.

## 2018-10-17 NOTE — ED Provider Notes (Signed)
[facility]    CSN: [id number] Arrival date & time: 10/17/18  1357     History   Chief Complaint Chief Complaint  Patient presents with  . Abscess    HPI Courtney [name] is a [age] y.o. female.   Patient here c/w abscess central chest x 3 days.  Admits pain, tenderness, swelling, erythema, denies fever, chills, nausea, vomiting, diarrhea, discharge.  PMH hidradenitis suppurativa, though she's never had an abscess in central chest, all others located axilla.  No advil or tylenol today.  She has not tried using warm compress.     Past Medical History:  Diagnosis Date  . ADHD (attention deficit hyperactivity disorder)   . Chronic tonsillitis 08/2013   current strep, will finish antibiotic 09/04/2013; snores during sleep, mother denies apnea  . Family history of adverse reaction to anesthesia    " My cousin had problems waking up. "  . Heart murmur    "when I was born; outgrew it; I was a preemie"  . Hidradenitis   . Migraine 12/19/2016   "I've just had this one for 3 days" (12/20/2016)    Patient Active Problem List   Diagnosis Date Noted  . Influenza A 04/16/2018  . Acute pain of right shoulder 11/25/2017  . Left foot pain 04/23/2017  . Axillary hidradenitis suppurativa 04/03/2017  . Axillary abscess 12/20/2016  . Vitamin D deficiency 10/18/2014  . S/P tonsillectomy 09/14/2013  . ADHD (attention deficit hyperactivity disorder) 02/11/2012    Past Surgical History:  Procedure Laterality Date  . AXILLARY HIDRADENITIS EXCISION    . CYSTOSCOPY W/ URETERAL STENT PLACEMENT Right 05/28/2017   Procedure: CYSTOSCOPY WITH RETROGRADE PYELOGRAM/ RIGHT URETERAL STENT PLACEMENT;  Surgeon: Crist FatHerrick, Benjamin W, MD;  Location: [facility];  Service: Urology;  Laterality: Right;  . HYDRADENITIS EXCISION Bilateral 04/03/2017   Procedure: EXCISION AND DRAINAGE BILATERAL  HIDRADENITIS AXILLA;  Surgeon: Griselda Mineroth, Paul III, MD;  [facility];  Service: General;  Laterality: Bilateral;  .  INCISION AND DRAINAGE ABSCESS Right 07/20/2016   Procedure: INCISION AND DRAINAGE ABSCESS right axilla;  Surgeon: Abigail MiyamotoBlackman, Douglas, MD;  [facility];  Service: General;  Laterality: Right;  . INTRAUTERINE DEVICE (IUD) INSERTION  11/2016   "had the one in my left arm removed"  . IRRIGATION AND DEBRIDEMENT ABSCESS Right 12/21/2016   Procedure: IRRIGATION AND DEBRIDEMENT RIGHT AXILLARY HIDRADENITIS;  Surgeon: Andria MeuseWhite, Christopher M, MD;  Location: [facility];  Service: General;  Laterality: Right;  . IRRIGATION AND DEBRIDEMENT ABSCESS Left 01/08/2017   Procedure: IRRIGATION AND DEBRIDEMENT AXILLARY ABSCESS;  Surgeon: Griselda Mineroth, Paul III, MD;  [facility];  Service: General;  Laterality: Left;  . TONSILLECTOMY Bilateral 09/14/2013   Procedure: BILATERAL TONSILLECTOMY;  Surgeon: Serena ColonelJefry Rosen, MD;  Location:  SUR[facility]ERY CENTER;  Service: ENT;  Laterality: Bilateral;  . TONSILLECTOMY    . TYMPANOPLASTY Bilateral    "rebuilt eardrums"  . WISDOM TOOTH EXTRACTION      OB History   No obstetric history on file.      Home Medications    Prior to Admission medications   Medication Sig Start Date End Date Taking? Authorizing Provider  diphenhydrAMINE (BENADRYL) 25 mg capsule Take 25 mg by mouth every 6 (six) hours as needed for itching or allergies.   Yes [provider]  EPINEPHrine (ADRENACLICK) 0.3 mg/0.3 mL IJ SOAJ injection Inject 0.3 mLs (0.3 mg total) into the muscle as needed for up to 2 doses for anaphylaxis. 04/06/18  Yes Virgina Norfolkuratolo, Adam, DO  ibuprofen (ADVIL,MOTRIN) 800 MG tablet Take 1 tablet (800 mg total) by mouth every 8 (eight) hours as needed. 04/09/17  Yes Ward, Chase PicketJaime Pilcher, PA-C  inFLIXimab (REMICADE IV) Inject into the vein.   Yes [provider]  Levonorgestrel (KYLEENA) 19.5 MG IUD 19.5 mg by Intrauterine route once.    Yes [provider]  methylphenidate (DAYTRANA) 20 MG/9HR Place 1 patch onto the skin daily. wear patch for 9 hours only each day 09/04/18   Yes Dianne DunAron, Talia M, MD  methylphenidate [facility](DAYTRANA) 20 MG/9HR Place 1 patch onto the skin daily. wear patch for 9 hours only each day 09/04/18  Yes Dianne DunAron, Talia M, MD  montelukast (SINGULAIR) 10 MG tablet Take 1 tablet (10 mg total) by mouth at bedtime. 11/18/17  Yes Dianne DunAron, Talia M, MD  rifampin (RIFADIN) 300 MG capsule Take 300 mg by mouth 2 (two) times daily. 12/18/17  Yes [provider]  spironolactone (ALDACTONE) 100 MG tablet Take 1 tablet by mouth daily. 04/22/17  Yes [provider]  albuterol (PROVENTIL HFA;VENTOLIN HFA) 108 (90 Base) MCG/ACT inhaler Inhale 2 puffs into the lungs every 6 (six) hours as needed. 11/18/17   Dianne DunAron, Talia M, MD  benzonatate (TESSALON) 200 MG capsule Take 1 capsule (200 mg total) by mouth 3 (three) times daily as needed for cough. Patient not taking: Reported on 05/13/2018 04/15/18   Mardella LaymanHagler, Brian, MD  clindamycin (CLEOCIN) 300 MG capsule Take 300 mg by mouth daily as needed (When she feels like she needs it).    [provider]  ibuprofen (ADVIL,MOTRIN) 200 MG tablet Take 400 mg by mouth every 6 (six) hours as needed for moderate pain.    [provider]  methylphenidate Lezlie Octave(DAYTRANA) 20 MG/9HR Place 1 patch onto the skin daily. wear patch for 9 hours only each day (Plz sched Sept appt) 10/14/18   Dianne DunAron, Talia M, MD  mometasone (NASONEX) 50 MCG/ACT nasal spray Place 2 sprays into the nose daily. Patient not taking: Reported on 05/13/2018 03/25/18   Eustace MooreNelson, Yvonne Sue, MD  ondansetron (ZOFRAN ODT) 4 MG disintegrating tablet Take 1 tablet (4 mg total) by mouth every 8 (eight) hours as needed for nausea or vomiting. 12/24/16   Focht, Joyce CopaJessica L, PA  sulfamethoxazole-trimethoprim (BACTRIM DS) 800-160 MG tablet Take 1 tablet by mouth 2 (two) times daily. 10/17/18   Evern CoreLindquist, Mahari Vankirk, PA-C    Family History Family History  Problem Relation Age of Onset  . Hypertension Mother   . Hyperlipidemia Mother   . Diabetes Mother   . CAD Father     Social  History Social History   Tobacco Use  . Smoking status: Never Smoker  . Smokeless tobacco: Never Used  Substance Use Topics  . Alcohol use: Yes    Comment: occasional  . Drug use: No     Allergies   Other, Shellfish allergy, Tapentadol, Tape, Morphine and related, Iodine, and Vancomycin   Review of Systems Review of Systems  Constitutional: Negative for chills and fever.  Gastrointestinal: Negative for abdominal pain and vomiting.  Musculoskeletal: Negative for arthralgias and back pain.  Skin: Positive for color change. Negative for rash.  Neurological: Negative for seizures and syncope.  Hematological: Negative for adenopathy.  Psychiatric/Behavioral: Negative for agitation and sleep disturbance.  All other systems reviewed and are negative.    Physical Exam Triage Vital Signs ED Triage Vitals  Enc Vitals Group     BP 10/17/18 1436 103/65     Pulse Rate 10/17/18 1436 83  Resp 10/17/18 1436 18     Temp 10/17/18 1436 98.3 F (36.8 C)     Temp src --      SpO2 10/17/18 1436 100 %     Weight --      Height --      Head Circumference --      Peak Flow --      Pain Score 10/17/18 1433 7     Pain Loc --      Pain Edu? --      Excl. in [place]? --    No data found.  Updated Vital Signs BP 103/65   Pulse 83   Temp 98.3 F (36.8 C)   Resp 18   SpO2 100%   Visual Acuity Right Eye Distance:   Left Eye Distance:   Bilateral Distance:    Right Eye Near:   Left Eye Near:    Bilateral Near:     Physical Exam Vitals signs and nursing note reviewed.  Constitutional:      General: She is not in acute distress.    Appearance: She is well-developed.  HENT:     Head: Normocephalic and atraumatic.     Nose: Nose normal.  Eyes:     General: No scleral icterus.    Extraocular Movements: Extraocular movements intact.     Conjunctiva/sclera: Conjunctivae normal.     Pupils: Pupils are equal, round, and reactive to light.  Neck:     Musculoskeletal: Neck supple.   Cardiovascular:     Rate and Rhythm: Normal rate and regular rhythm.     Heart sounds: No murmur.  Pulmonary:     Effort: Pulmonary effort is normal. No respiratory distress.     Breath sounds: Normal breath sounds.  Abdominal:     Palpations: Abdomen is soft.     Tenderness: There is no abdominal tenderness.  Skin:    General: Skin is warm and dry.     Coloration: Skin is not jaundiced.     Findings: Abscess and lesion present.       Neurological:     Mental Status: She is alert.      UC Treatments / Results  Labs (all labs ordered are listed, but only abnormal results are displayed) Labs Reviewed - No data to display  EKG   Radiology No results found.  Procedures Incision and Drainage  Date/Time: 10/17/2018 2:52 PM Performed by: Peri Jefferson, PA-C Authorized by: Peri Jefferson, PA-C   Consent:    Consent obtained:  Verbal   Consent given by:  Patient   Risks discussed:  Bleeding, incomplete drainage, pain and infection   Alternatives discussed:  Delayed treatment Location:    Type:  Abscess   Size:  1 cm   Location:  Trunk   Trunk location:  Chest Pre-procedure details:    Skin preparation:  Antiseptic wash Anesthesia (see MAR for exact dosages):    Anesthesia method:  None Procedure type:    Complexity:  Simple Procedure details:    Needle aspiration: yes     Needle size:  18 G   Incision types:  Stab incision   Incision depth:  Subcutaneous   Drainage:  Purulent   Drainage amount:  Scant   Packing materials:  None Post-procedure details:    Patient tolerance of procedure:  Tolerated well, no immediate complications   (including critical care time)  Medications Ordered in UC Medications  ketorolac (TORADOL) injection 60 mg (60 mg Intramuscular Given 10/17/18 1507)  ketorolac (TORADOL) 60 MG/2ML injection (has no administration in time range)    Initial Impression / Assessment and Plan / UC Course  I have reviewed the triage vital signs  and the nursing notes.  Pertinent labs & imaging results that were available during my care of the patient were reviewed by me and considered in my medical decision making (see chart for details).     Puncture aspiration over I&D at patient preference.   Take antibiotics as prescribed. Abscess may need to be drained again if no improvement. Take ibuprofen ever 6 to 8 hours as needed for pain - take first dose tomorrow morning due to toradol injection today.  Final Clinical Impressions(s) / UC Diagnoses   Final diagnoses:  Cellulitis of chest wall  Abscess of chest wall     Discharge Instructions     Take medication as prescribed. Follow up with PCP or dermatologist or return here if you have no improvement. Lanced produced small amount of purulent discharge, it may need to be drained again. Take ibuprofen or tylenol every 6 to 8 hours as needed for pain.    ED Prescriptions    Medication Sig Dispense Auth. Provider   sulfamethoxazole-trimethoprim (BACTRIM DS) 800-160 MG tablet Take 1 tablet by mouth 2 (two) times daily. 20 tablet Evern CoreLindquist, Kenslie Abbruzzese, PA-C     Controlled Substance Prescriptions [place] Controlled Substance Registry consulted? Not Applicable   Evern CoreLindquist, Deckard Stuber, PA-C 10/17/18 [id number]

## 2018-10-17 NOTE — ED Triage Notes (Signed)
Pt presents with complaints of abscess to her chest x 3 days that is painful to touch and swollen.

## 2018-10-20 DIAGNOSIS — R87610 Atypical squamous cells of undetermined significance on cytologic smear of cervix (ASC-US): Secondary | ICD-10-CM | POA: Diagnosis not present

## 2018-10-20 DIAGNOSIS — R875 Abnormal microbiological findings in specimens from female genital organs: Secondary | ICD-10-CM | POA: Diagnosis not present

## 2018-10-20 DIAGNOSIS — Z124 Encounter for screening for malignant neoplasm of cervix: Secondary | ICD-10-CM | POA: Diagnosis not present

## 2018-10-20 DIAGNOSIS — Z01419 Encounter for gynecological examination (general) (routine) without abnormal findings: Secondary | ICD-10-CM | POA: Diagnosis not present

## 2018-10-20 LAB — HM PAP SMEAR

## 2018-10-31 ENCOUNTER — Encounter: Admit: 2018-10-31 | Discharge: 2018-11-01 | Payer: PRIVATE HEALTH INSURANCE

## 2018-10-31 DIAGNOSIS — L732 Hidradenitis suppurativa: Principal | ICD-10-CM

## 2018-11-04 DIAGNOSIS — L299 Pruritus, unspecified: Secondary | ICD-10-CM | POA: Diagnosis not present

## 2018-11-04 DIAGNOSIS — H7091 Unspecified mastoiditis, right ear: Secondary | ICD-10-CM | POA: Diagnosis not present

## 2018-11-04 DIAGNOSIS — H9012 Conductive hearing loss, unilateral, left ear, with unrestricted hearing on the contralateral side: Secondary | ICD-10-CM | POA: Diagnosis not present

## 2018-11-11 ENCOUNTER — Other Ambulatory Visit: Payer: Self-pay | Admitting: Family Medicine

## 2018-11-11 MED ORDER — DAYTRANA 20 MG/9HR TD PTCH: 1.0000 | MEDICATED_PATCH | Freq: Every day | TRANSDERMAL | 0 refills | Status: DC

## 2018-11-11 NOTE — Telephone Encounter (Signed)
Medication Refill - Medication:  methylphenidate (DAYTRANA) 20 MG/9HR   Has the patient contacted their pharmacy?  Yes advised to call office.   Preferred Pharmacy (with phone number or street name):  [id number] [name], [place] [phone] (Phone) [phone] (Fax)   Agent: Please be advised that RX refills may take up to 3 business days. We ask that you follow-up with your pharmacy.

## 2018-11-18 ENCOUNTER — Ambulatory Visit (INDEPENDENT_AMBULATORY_CARE_PROVIDER_SITE_OTHER): Payer: BC Managed Care – PPO

## 2018-11-18 ENCOUNTER — Other Ambulatory Visit: Payer: Self-pay

## 2018-11-18 DIAGNOSIS — Z23 Encounter for immunization: Secondary | ICD-10-CM | POA: Diagnosis not present

## 2018-11-18 NOTE — Progress Notes (Signed)
Pt came into the office to receive her flu shot. Injection given in the left deltoid. Pt tolerated injection well, no signs/symptoms of a reaction prior to pt leaving the office. VIS given to pt.

## 2018-11-19 ENCOUNTER — Encounter: Admit: 2018-11-19 | Discharge: 2018-11-20 | Payer: PRIVATE HEALTH INSURANCE

## 2018-11-19 DIAGNOSIS — L732 Hidradenitis suppurativa: Secondary | ICD-10-CM

## 2018-11-19 DIAGNOSIS — Z79899 Other long term (current) drug therapy: Secondary | ICD-10-CM

## 2018-11-19 DIAGNOSIS — F411 Generalized anxiety disorder: Secondary | ICD-10-CM

## 2018-11-19 MED ORDER — DIAZEPAM 10 MG TABLET
ORAL_TABLET | ORAL | 0 refills | 0.00000 days | Status: CP
Start: 2018-11-19 — End: ?

## 2018-11-21 ENCOUNTER — Other Ambulatory Visit: Payer: Self-pay

## 2018-11-21 ENCOUNTER — Other Ambulatory Visit: Payer: Self-pay | Admitting: Obstetrics

## 2018-11-21 ENCOUNTER — Ambulatory Visit
Admission: RE | Admit: 2018-11-21 | Discharge: 2018-11-21 | Disposition: A | Discharge status: Home / Self Care | Payer: BC Managed Care – PPO | Source: Ambulatory Visit | Attending: Obstetrics | Admitting: Obstetrics

## 2018-11-21 DIAGNOSIS — Z30431 Encounter for routine checking of intrauterine contraceptive device: Secondary | ICD-10-CM | POA: Diagnosis not present

## 2018-11-21 DIAGNOSIS — Z6831 Body mass index (BMI) 31.0-31.9, adult: Secondary | ICD-10-CM | POA: Diagnosis not present

## 2018-12-04 ENCOUNTER — Other Ambulatory Visit: Payer: Self-pay

## 2018-12-04 ENCOUNTER — Ambulatory Visit (HOSPITAL_COMMUNITY)
Admission: EM | Admit: 2018-12-04 | Discharge: 2018-12-04 | Disposition: A | Discharge status: Home / Self Care | Payer: BC Managed Care – PPO | Attending: Family Medicine | Admitting: Family Medicine

## 2018-12-04 ENCOUNTER — Encounter (HOSPITAL_COMMUNITY): Payer: Self-pay

## 2018-12-04 DIAGNOSIS — J0110 Acute frontal sinusitis, unspecified: Secondary | ICD-10-CM | POA: Insufficient documentation

## 2018-12-04 DIAGNOSIS — Z20828 Contact with and (suspected) exposure to other viral communicable diseases: Secondary | ICD-10-CM | POA: Diagnosis not present

## 2018-12-04 MED ORDER — DEXAMETHASONE SODIUM PHOSPHATE 10 MG/ML IJ SOLN
INTRAMUSCULAR | Status: AC
  Filled 2018-12-04: qty 1

## 2018-12-04 MED ORDER — AMOXICILLIN-POT CLAVULANATE 875-125 MG PO TABS: 1.0000 | ORAL_TABLET | Freq: Two times a day (BID) | ORAL | 0 refills | Status: DC

## 2018-12-04 MED ORDER — FLUTICASONE PROPIONATE 50 MCG/ACT NA SUSP: 1.0000 | Freq: Every day | NASAL | 2 refills | Status: DC

## 2018-12-04 MED ORDER — DEXAMETHASONE SODIUM PHOSPHATE 10 MG/ML IJ SOLN
10.0000 mg | Freq: Once | INTRAMUSCULAR | Status: AC
  Administered 2018-12-04: 09:00:00 10 mg via INTRAMUSCULAR

## 2018-12-04 NOTE — ED Provider Notes (Signed)
[facility]    CSN: [id number] Arrival date & time: 12/04/18  [id number]      History   Chief Complaint Chief Complaint  Patient presents with  . Sinusitis    HPI Courtney [name] is a 28 y.o. female.   Patient is a 28 year old female with past medical history of ADHD, hidradenitis, migraine.  She presents today with approximately 4 to 5 days of sinus congestion, thick nasal drainage, sinus pressure, bilateral ear pressure and fullness.  Symptoms have been constant and worsening.  She has been taking over-the-counter severe sinus, Claritin, NyQuil, DayQuil and her symptoms are worsening.  Patient works as a Radiation protection practitionerparamedic.  No known COVID exposures.  No significant cough or fevers.  ROS per HPI    Sinusitis Pain details:    Location:  Frontal and maxillary   Quality:  Aching and pressure   Severity:  Moderate   Duration:  5 days   Timing:  Constant Progression:  Worsening Chronicity:  New Context: allergies   Relieved by:  Nothing Worsened by:  Nothing Ineffective treatments:  Oral decongestants and acetaminophen Associated symptoms: ear pain, headaches and rhinorrhea   Associated symptoms: no chest pain, no chills, no congestion, no cough, no fatigue, no fever, no hoarse voice, no mouth breathing, no nausea, no shortness of breath, no snoring, no sore throat, no swollen glands, no tooth pain, no vertigo, no vomiting and no wheezing     Past Medical History:  Diagnosis Date  . ADHD (attention deficit hyperactivity disorder)   . Chronic tonsillitis 08/2013   current strep, will finish antibiotic 09/04/2013; snores during sleep, mother denies apnea  . Family history of adverse reaction to anesthesia    " My cousin had problems waking up. "  . Heart murmur    "when I was born; outgrew it; I was a preemie"  . Hidradenitis   . Migraine 12/19/2016   "I've just had this one for 3 days" (12/20/2016)    Patient Active Problem List   Diagnosis Date Noted  . Influenza A  04/16/2018  . Acute pain of right shoulder 11/25/2017  . Left foot pain 04/23/2017  . Axillary hidradenitis suppurativa 04/03/2017  . Axillary abscess 12/20/2016  . Vitamin D deficiency 10/18/2014  . S/P tonsillectomy 09/14/2013  . ADHD (attention deficit hyperactivity disorder) 02/11/2012    Past Surgical History:  Procedure Laterality Date  . AXILLARY HIDRADENITIS EXCISION    . CYSTOSCOPY W/ URETERAL STENT PLACEMENT Right 05/28/2017   Procedure: CYSTOSCOPY WITH RETROGRADE PYELOGRAM/ RIGHT URETERAL STENT PLACEMENT;  Surgeon: Crist FatHerrick, Benjamin W, MD;  Location: [facility];  Service: Urology;  Laterality: Right;  . HYDRADENITIS EXCISION Bilateral 04/03/2017   Procedure: EXCISION AND DRAINAGE BILATERAL  HIDRADENITIS AXILLA;  Surgeon: Griselda Mineroth, Paul III, MD;  [facility];  Service: General;  Laterality: Bilateral;  . INCISION AND DRAINAGE ABSCESS Right 07/20/2016   Procedure: INCISION AND DRAINAGE ABSCESS right axilla;  Surgeon: Abigail MiyamotoBlackman, Douglas, MD;  [facility];  Service: General;  Laterality: Right;  . INTRAUTERINE DEVICE (IUD) INSERTION  11/2016   "had the one in my left arm removed"  . IRRIGATION AND DEBRIDEMENT ABSCESS Right 12/21/2016   Procedure: IRRIGATION AND DEBRIDEMENT RIGHT AXILLARY HIDRADENITIS;  Surgeon: Andria MeuseWhite, Christopher M, MD;  [facility];  Service: General;  Laterality: Right;  . IRRIGATION AND DEBRIDEMENT ABSCESS Left 01/08/2017   Procedure: IRRIGATION AND DEBRIDEMENT AXILLARY ABSCESS;  Surgeon: Griselda Mineroth, Paul III, MD;  [facility];  Service: General;  Laterality: Left;  .  TONSILLECTOMY Bilateral 09/14/2013   Procedure: BILATERAL TONSILLECTOMY;  Surgeon: Izora Gala, MD;  Location: [place];  Service: ENT;  Laterality: Bilateral;  . TONSILLECTOMY    . TYMPANOPLASTY Bilateral    "rebuilt eardrums"  . WISDOM TOOTH EXTRACTION      OB History   No obstetric history on file.      Home Medications    Prior to Admission medications   Medication  Sig Start Date End Date Taking? Authorizing Provider  albuterol (PROVENTIL HFA;VENTOLIN HFA) 108 (90 Base) MCG/ACT inhaler Inhale 2 puffs into the lungs every 6 (six) hours as needed. 11/18/17   Lucille Passy, MD  amoxicillin-clavulanate (AUGMENTIN) 875-125 MG tablet Take 1 tablet by mouth every 12 (twelve) hours. 12/04/18   Loura Halt A, NP  diphenhydrAMINE (BENADRYL) 25 mg capsule Take 25 mg by mouth every 6 (six) hours as needed for itching or allergies.    [provider]  EPINEPHrine (ADRENACLICK) 0.3 VO/1.6 mL IJ SOAJ injection Inject 0.3 mLs (0.3 mg total) into the muscle as needed for up to 2 doses for anaphylaxis. 04/06/18   Curatolo, Adam, DO  fluticasone (FLONASE) 50 MCG/ACT nasal spray Place 1 spray into both nostrils daily. 12/04/18   Loura Halt A, NP  ibuprofen (ADVIL,MOTRIN) 200 MG tablet Take 400 mg by mouth every 6 (six) hours as needed for moderate pain.    [provider]  ibuprofen (ADVIL,MOTRIN) 800 MG tablet Take 1 tablet (800 mg total) by mouth every 8 (eight) hours as needed. 04/09/17   Ward, Ozella Almond, PA-C  inFLIXimab (REMICADE IV) Inject into the vein.    [provider]  Levonorgestrel (KYLEENA) 19.5 MG IUD 19.5 mg by Intrauterine route once.     [provider]  methylphenidate [facility] Phoenix) 20 MG/9HR Place 1 patch onto the skin daily. wear patch for 9 hours only each day 09/04/18   Lucille Passy, MD  methylphenidate [facility]) 20 MG/9HR Place 1 patch onto the skin daily. wear patch for 9 hours only each day (Plz sched Sept appt) 10/14/18   Lucille Passy, MD  methylphenidate [facility]) 20 MG/9HR Place 1 patch onto the skin daily. wear patch for 9 hours only each day 11/11/18   Lucille Passy, MD  montelukast (SINGULAIR) 10 MG tablet Take 1 tablet (10 mg total) by mouth at bedtime. 11/18/17   Lucille Passy, MD  ondansetron (ZOFRAN ODT) 4 MG disintegrating tablet Take 1 tablet (4 mg total) by mouth every 8 (eight) hours as needed for nausea or  vomiting. 12/24/16   Focht, Fraser Din, PA  rifampin (RIFADIN) 300 MG capsule Take 300 mg by mouth 2 (two) times daily. 12/18/17   [provider]  spironolactone (ALDACTONE) 100 MG tablet Take 1 tablet by mouth daily. 04/22/17   [provider]  mometasone (NASONEX) 50 MCG/ACT nasal spray Place 2 sprays into the nose daily. Patient not taking: Reported on 05/13/2018 03/25/18 12/04/18  Raylene Everts, MD    Family History Family History  Problem Relation Age of Onset  . Hypertension Mother   . Hyperlipidemia Mother   . Diabetes Mother   . CAD Father     Social History Social History   Tobacco Use  . Smoking status: Never Smoker  . Smokeless tobacco: Never Used  Substance Use Topics  . Alcohol use: Yes    Comment: occasional  . Drug use: No     Allergies   Other, Shellfish allergy, Tapentadol, Tape, Morphine and related,  Iodine, and Vancomycin   Review of Systems Review of Systems  Constitutional: Negative for chills, fatigue and fever.  HENT: Positive for ear pain and rhinorrhea. Negative for congestion, hoarse voice and sore throat.   Respiratory: Negative for snoring, cough, shortness of breath and wheezing.   Cardiovascular: Negative for chest pain.  Gastrointestinal: Negative for nausea and vomiting.  Neurological: Positive for headaches. Negative for vertigo.     Physical Exam Triage Vital Signs ED Triage Vitals  Enc Vitals Group     BP 12/04/18 0829 118/75     Pulse Rate 12/04/18 0829 82     Resp 12/04/18 0829 18     Temp 12/04/18 0829 98.1 F (36.7 C)     Temp Source 12/04/18 0829 Temporal     SpO2 12/04/18 0829 100 %     Weight --      Height --      Head Circumference --      Peak Flow --      Pain Score 12/04/18 0830 0     Pain Loc --      Pain Edu? --      Excl. in [place]? --    No data found.  Updated Vital Signs BP 118/75 (BP Location: Left Arm)   Pulse 82   Temp 98.1 F (36.7 C) (Temporal)   Resp 18   SpO2 100%    Visual Acuity Right Eye Distance:   Left Eye Distance:   Bilateral Distance:    Right Eye Near:   Left Eye Near:    Bilateral Near:     Physical Exam Vitals signs and nursing note reviewed.  Constitutional:      General: She is not in acute distress.    Appearance: Normal appearance. She is not ill-appearing, toxic-appearing or diaphoretic.  HENT:     Head: Normocephalic and atraumatic.     Right Ear: Tympanic membrane and ear canal normal.     Left Ear: Tympanic membrane and ear canal normal.     Nose: Congestion and rhinorrhea present.     Comments: Bilateral severe nasal turbinate swelling with frontal and maxillary sinus tenderness.    Mouth/Throat:     Pharynx: Oropharynx is clear.  Eyes:     Conjunctiva/sclera: Conjunctivae normal.  Neck:     Musculoskeletal: Normal range of motion.  Cardiovascular:     Rate and Rhythm: Normal rate and regular rhythm.  Pulmonary:     Effort: Pulmonary effort is normal.     Breath sounds: Normal breath sounds.  Musculoskeletal: Normal range of motion.  Lymphadenopathy:     Cervical: No cervical adenopathy.  Skin:    General: Skin is warm and dry.  Neurological:     Mental Status: She is alert.  Psychiatric:        Mood and Affect: Mood normal.      UC Treatments / Results  Labs (all labs ordered are listed, but only abnormal results are displayed) Labs Reviewed  NOVEL CORONAVIRUS, NAA (HOSP ORDER, SEND-OUT TO REF LAB; TAT 18-24 HRS)    EKG   Radiology No results found.  Procedures Procedures (including critical care time)  Medications Ordered in UC Medications  dexamethasone (DECADRON) injection 10 mg (10 mg Intramuscular Given 12/04/18 0856)  dexamethasone (DECADRON) 10 MG/ML injection (has no administration in time range)    Initial Impression / Assessment and Plan / UC Course  I have reviewed the triage vital signs and the nursing notes.  Pertinent labs & imaging  results that were available during my care  of the patient were reviewed by me and considered in my medical decision making (see chart for details).     Sinusitis-treating with Augmentin and Flonase and will have her continue the over-the-counter medication for symptoms. COVID test pending.  Follow up as needed for continued or worsening symptoms  Final Clinical Impressions(s) / UC Diagnoses   Final diagnoses:  Acute non-recurrent frontal sinusitis     Discharge Instructions     Treating you for a sinus infection. Keep using the over-the-counter medications that you have been using for symptoms  Use the antibiotics as prescribed Steroid injection given here in clinic. You  can try adding in Flonase nasal spray to see if this helps.    ED Prescriptions    Medication Sig Dispense Auth. Provider   amoxicillin-clavulanate (AUGMENTIN) 875-125 MG tablet Take 1 tablet by mouth every 12 (twelve) hours. 14 tablet Cantrell Martus A, NP   fluticasone (FLONASE) 50 MCG/ACT nasal spray Place 1 spray into both nostrils daily. 16 g Dahlia Byes A, NP     PDMP not reviewed this encounter.   Janace Aris, NP 12/04/18 [phone]

## 2018-12-04 NOTE — ED Triage Notes (Signed)
Patient presents to Urgent Care with complaints of sinus congestion since about 3-4 days ago. Patient reports she has tried some otc meds but it has not helped much in the long term.

## 2018-12-04 NOTE — Discharge Instructions (Addendum)
Treating you for a sinus infection. Keep using the over-the-counter medications that you have been using for symptoms  Use the antibiotics as prescribed Steroid injection given here in clinic. You  can try adding in Flonase nasal spray to see if this helps.

## 2018-12-06 LAB — NOVEL CORONAVIRUS, NAA (HOSP ORDER, SEND-OUT TO REF LAB; TAT 18-24 HRS): SARS-CoV-2, NAA: NOT DETECTED

## 2018-12-08 ENCOUNTER — Other Ambulatory Visit: Payer: Self-pay

## 2018-12-08 ENCOUNTER — Encounter (HOSPITAL_COMMUNITY): Payer: Self-pay

## 2018-12-08 ENCOUNTER — Ambulatory Visit (HOSPITAL_COMMUNITY)
Admission: EM | Admit: 2018-12-08 | Discharge: 2018-12-08 | Disposition: A | Discharge status: Home / Self Care | Payer: BC Managed Care – PPO | Attending: Family Medicine | Admitting: Family Medicine

## 2018-12-08 DIAGNOSIS — B0089 Other herpesviral infection: Secondary | ICD-10-CM | POA: Diagnosis not present

## 2018-12-08 MED ORDER — VALACYCLOVIR HCL 1 G PO TABS: 1000.0000 mg | ORAL_TABLET | Freq: Three times a day (TID) | ORAL | 0 refills | Status: DC

## 2018-12-08 NOTE — Discharge Instructions (Addendum)
You need to report to [employer] that you are having problems with your finger.  This is a viral infection.  Is usually caused by herpes simplex.  It is associated with a wound on the hand. Valacyclovir is an antiviral medication.  Take 3 times a day for a week Follow-up with your Worker's Comp. doctor

## 2018-12-08 NOTE — ED Provider Notes (Signed)
[facility]    CSN: [id number] Arrival date & time: 12/08/18  1350      History   Chief Complaint Chief Complaint  Patient presents with  . Hand Pain    HPI Courtney [name] is a 28 y.o. female.   HPI  Patient works as a Radiation protection practitioner.  She states she had a needlestick 2 days ago.  This was reported.  She went to the hospital and had blood work to check for HIV and hepatitis.  She states that she is on Augmentin for a sinus infection.  In spite of this the area of the needlestick is become very red and painful.  She is here for evaluation.  She is not claiming today's been is a Teacher, adult education., in spite of my advice that she should  Past Medical History:  Diagnosis Date  . ADHD (attention deficit hyperactivity disorder)   . Chronic tonsillitis 08/2013   current strep, will finish antibiotic 09/04/2013; snores during sleep, mother denies apnea  . Family history of adverse reaction to anesthesia    " My cousin had problems waking up. "  . Heart murmur    "when I was born; outgrew it; I was a preemie"  . Hidradenitis   . Migraine 12/19/2016   "I've just had this one for 3 days" (12/20/2016)    Patient Active Problem List   Diagnosis Date Noted  . Influenza A 04/16/2018  . Acute pain of right shoulder 11/25/2017  . Left foot pain 04/23/2017  . Axillary hidradenitis suppurativa 04/03/2017  . Axillary abscess 12/20/2016  . Vitamin D deficiency 10/18/2014  . S/P tonsillectomy 09/14/2013  . ADHD (attention deficit hyperactivity disorder) 02/11/2012    Past Surgical History:  Procedure Laterality Date  . AXILLARY HIDRADENITIS EXCISION    . CYSTOSCOPY W/ URETERAL STENT PLACEMENT Right 05/28/2017   Procedure: CYSTOSCOPY WITH RETROGRADE PYELOGRAM/ RIGHT URETERAL STENT PLACEMENT;  Surgeon: Crist Fat, MD;  Location: [facility];  Service: Urology;  Laterality: Right;  . HYDRADENITIS EXCISION Bilateral 04/03/2017   Procedure: EXCISION AND DRAINAGE BILATERAL  HIDRADENITIS  AXILLA;  Surgeon: Griselda Miner, MD;  Location: [facility] OR;  Service: General;  Laterality: Bilateral;  . INCISION AND DRAINAGE ABSCESS Right 07/20/2016   Procedure: INCISION AND DRAINAGE ABSCESS right axilla;  Surgeon: Abigail Miyamoto, MD;  Location: [facility] OR;  Service: General;  Laterality: Right;  . INTRAUTERINE DEVICE (IUD) INSERTION  11/2016   "had the one in my left arm removed"  . IRRIGATION AND DEBRIDEMENT ABSCESS Right 12/21/2016   Procedure: IRRIGATION AND DEBRIDEMENT RIGHT AXILLARY HIDRADENITIS;  Surgeon: Andria Meuse, MD;  [facility];  Service: General;  Laterality: Right;  . IRRIGATION AND DEBRIDEMENT ABSCESS Left 01/08/2017   Procedure: IRRIGATION AND DEBRIDEMENT AXILLARY ABSCESS;  Surgeon: Griselda Miner, MD;  Location: [facility] OR;  Service: General;  Laterality: Left;  . TONSILLECTOMY Bilateral 09/14/2013   Procedure: BILATERAL TONSILLECTOMY;  Surgeon: Serena Colonel, MD;  Location: [facility];  Service: ENT;  Laterality: Bilateral;  . TONSILLECTOMY    . TYMPANOPLASTY Bilateral    "rebuilt eardrums"  . WISDOM TOOTH EXTRACTION      OB History   No obstetric history on file.      Home Medications    Prior to Admission medications   Medication Sig Start Date End Date Taking? Authorizing Provider  amoxicillin-clavulanate (AUGMENTIN) 875-125 MG tablet Take 1 tablet by mouth every 12 (twelve) hours. 12/04/18   Janace Aris, NP  diphenhydrAMINE (  BENADRYL) 25 mg capsule Take 25 mg by mouth every 6 (six) hours as needed for itching or allergies.    [provider]  EPINEPHrine (ADRENACLICK) 0.3 IO/9.7 mL IJ SOAJ injection Inject 0.3 mLs (0.3 mg total) into the muscle as needed for up to 2 doses for anaphylaxis. 04/06/18   Curatolo, Adam, DO  fluticasone (FLONASE) 50 MCG/ACT nasal spray Place 1 spray into both nostrils daily. 12/04/18   Loura Halt A, NP  ibuprofen (ADVIL,MOTRIN) 200 MG tablet Take 400 mg by mouth every 6 (six) hours as needed for moderate pain.     [provider]  ibuprofen (ADVIL,MOTRIN) 800 MG tablet Take 1 tablet (800 mg total) by mouth every 8 (eight) hours as needed. 04/09/17   Ward, Ozella Almond, PA-C  inFLIXimab (REMICADE IV) Inject into the vein.    [provider]  Levonorgestrel (KYLEENA) 19.5 MG IUD 19.5 mg by Intrauterine route once.     [provider]  methylphenidate [facility]) 20 MG/9HR Place 1 patch onto the skin daily. wear patch for 9 hours only each day 09/04/18   Lucille Passy, MD  methylphenidate [facility]) 20 MG/9HR Place 1 patch onto the skin daily. wear patch for 9 hours only each day (Plz sched Sept appt) 10/14/18   Lucille Passy, MD  methylphenidate [facility]) 20 MG/9HR Place 1 patch onto the skin daily. wear patch for 9 hours only each day 11/11/18   Lucille Passy, MD  montelukast (SINGULAIR) 10 MG tablet Take 1 tablet (10 mg total) by mouth at bedtime. 11/18/17   Lucille Passy, MD  ondansetron (ZOFRAN ODT) 4 MG disintegrating tablet Take 1 tablet (4 mg total) by mouth every 8 (eight) hours as needed for nausea or vomiting. 12/24/16   Focht, Fraser Din, PA  rifampin (RIFADIN) 300 MG capsule Take 300 mg by mouth 2 (two) times daily. 12/18/17   [provider]  spironolactone (ALDACTONE) 100 MG tablet Take 1 tablet by mouth daily. 04/22/17   [provider]  valACYclovir (VALTREX) 1000 MG tablet Take 1 tablet (1,000 mg total) by mouth 3 (three) times daily. 12/08/18   Raylene Everts, MD  albuterol (PROVENTIL HFA;VENTOLIN HFA) 108 (90 Base) MCG/ACT inhaler Inhale 2 puffs into the lungs every 6 (six) hours as needed. 11/18/17 12/08/18  Lucille Passy, MD  mometasone (NASONEX) 50 MCG/ACT nasal spray Place 2 sprays into the nose daily. Patient not taking: Reported on 05/13/2018 03/25/18 12/04/18  Raylene Everts, MD    Family History Family History  Problem Relation Age of Onset  . Hypertension Mother   . Hyperlipidemia Mother   . Diabetes Mother   . CAD Father     Social  History Social History   Tobacco Use  . Smoking status: Never Smoker  . Smokeless tobacco: Never Used  Substance Use Topics  . Alcohol use: Yes    Comment: occasional  . Drug use: No     Allergies   Other, Shellfish allergy, Tapentadol, Tape, Morphine and related, Iodine, and Vancomycin   Review of Systems Review of Systems  Constitutional: Negative for chills and fever.  HENT: Negative for ear pain and sore throat.   Eyes: Negative for pain and visual disturbance.  Respiratory: Negative for cough and shortness of breath.   Cardiovascular: Negative for chest pain and palpitations.  Gastrointestinal: Negative for abdominal pain and vomiting.  Genitourinary: Negative for dysuria and hematuria.  Musculoskeletal: Negative for arthralgias and back pain.  Skin: Positive for color  change and wound. Negative for rash.  Neurological: Negative for seizures and syncope.  All other systems reviewed and are negative.    Physical Exam Triage Vital Signs ED Triage Vitals [12/08/18 1425]  Enc Vitals Group     BP 117/73     Pulse Rate (!) 107     Resp 18     Temp 98.6 F (37 C)     Temp Source Oral     SpO2 99 %     Weight      Height      Head Circumference      Peak Flow      Pain Score 6     Pain Loc      Pain Edu?      Excl. in GC?    No data found.  Updated Vital Signs BP 117/73 (BP Location: Left Arm)   Pulse (!) 107   Temp 98.6 F (37 C) (Oral)   Resp 18   SpO2 99%   Visual Acuity Right Eye Distance:   Left Eye Distance:   Bilateral Distance:    Right Eye Near:   Left Eye Near:    Bilateral Near:     Physical Exam Constitutional:      General: She is not in acute distress.    Appearance: She is well-developed.  HENT:     Head: Normocephalic and atraumatic.  Eyes:     Conjunctiva/sclera: Conjunctivae normal.     Pupils: Pupils are equal, round, and reactive to light.  Neck:     Musculoskeletal: Normal range of motion.  Cardiovascular:     Rate  and Rhythm: Normal rate.  Pulmonary:     Effort: Pulmonary effort is normal. No respiratory distress.  Abdominal:     General: There is no distension.     Palpations: Abdomen is soft.  Musculoskeletal: Normal range of motion.  Skin:    General: Skin is warm and dry.     Comments: Cluster of vesicles on erythematous base, swollen, very tender as photographed, index finger.  Puncture wound is not visible  Neurological:     Mental Status: She is alert.       Poor focus noted.  Left index finger   UC Treatments / Results  Labs (all labs ordered are listed, but only abnormal results are displayed) Labs Reviewed - No data to display  EKG   Radiology No results found.  Procedures Procedures (including critical care time)  Medications Ordered in UC Medications - No data to display  Initial Impression / Assessment and Plan / UC Course  I have reviewed the triage vital signs and the nursing notes.  Pertinent labs & imaging results that were available during my care of the patient were reviewed by me and considered in my medical decision making (see chart for details).     Discussed with patient that this is herpes simplex.  I have never seen this as a consequence of a needlestick, however, by her report this is the site of the needlestick.  There is no other open wound. Final Clinical Impressions(s) / UC Diagnoses   Final diagnoses:  Herpetic whitlow     Discharge Instructions     You need to report to Worker's Comp. that you are having problems with your finger.  This is a viral infection.  Is usually caused by herpes simplex.  It is associated with a wound on the hand. Valacyclovir is an antiviral medication.  Take 3 times a  day for a week Follow-up with your Worker's Comp. doctor    ED Prescriptions    Medication Sig Dispense Auth. Provider   valACYclovir (VALTREX) 1000 MG tablet Take 1 tablet (1,000 mg total) by mouth 3 (three) times daily. 21 tablet Eustace Moore, MD     PDMP not reviewed this encounter.   Eustace Moore, MD 12/08/18 2214

## 2018-12-08 NOTE — ED Triage Notes (Signed)
Pt present left hand/index finger. Pt is unsure if she was stick with a dirty needle or what but her finger hurts.  The bruise on the patient finger is red and warm to the touch.

## 2018-12-10 DIAGNOSIS — R87810 Cervical high risk human papillomavirus (HPV) DNA test positive: Secondary | ICD-10-CM | POA: Diagnosis not present

## 2018-12-10 DIAGNOSIS — Z3202 Encounter for pregnancy test, result negative: Secondary | ICD-10-CM | POA: Diagnosis not present

## 2018-12-10 DIAGNOSIS — B372 Candidiasis of skin and nail: Secondary | ICD-10-CM | POA: Diagnosis not present

## 2018-12-10 DIAGNOSIS — R87610 Atypical squamous cells of undetermined significance on cytologic smear of cervix (ASC-US): Secondary | ICD-10-CM | POA: Diagnosis not present

## 2018-12-10 DIAGNOSIS — Z6836 Body mass index (BMI) 36.0-36.9, adult: Secondary | ICD-10-CM | POA: Diagnosis not present

## 2018-12-10 DIAGNOSIS — N72 Inflammatory disease of cervix uteri: Secondary | ICD-10-CM | POA: Diagnosis not present

## 2018-12-12 ENCOUNTER — Other Ambulatory Visit: Payer: Self-pay | Admitting: Family Medicine

## 2018-12-12 MED ORDER — METHYLPHENIDATE 20 MG/9HR TD PTCH: 1.0000 | MEDICATED_PATCH | Freq: Every day | TRANSDERMAL | 0 refills | Status: DC

## 2018-12-12 NOTE — Telephone Encounter (Signed)
Copied from [place] [id number]. Topic: Quick Communication - Rx Refill/Question >> Dec 12, 2018  9:24 AM Rainey Pines A wrote: Medication: methylphenidate [facility]) 20 MG/9HR   Has the patient contacted their pharmacy? Yes (Agent: If no, request that the patient contact the pharmacy for the refill.) (Agent: If yes, when and what did the pharmacy advise?)Contact PCP  Preferred Pharmacy (with phone number or street name):   [id number] [name], [place] [phone] (Phone) [phone] (Fax)   Agent: Please be advised that RX refills may take up to 3 business days. We ask that you follow-up with your pharmacy.

## 2018-12-15 ENCOUNTER — Encounter: Admit: 2018-12-15 | Discharge: 2018-12-16 | Payer: PRIVATE HEALTH INSURANCE

## 2018-12-15 DIAGNOSIS — L732 Hidradenitis suppurativa: Principal | ICD-10-CM

## 2018-12-25 ENCOUNTER — Encounter (HOSPITAL_COMMUNITY): Payer: Self-pay

## 2018-12-25 ENCOUNTER — Ambulatory Visit (HOSPITAL_COMMUNITY)
Admission: EM | Admit: 2018-12-25 | Discharge: 2018-12-25 | Disposition: A | Discharge status: Home / Self Care | Payer: BC Managed Care – PPO | Attending: Family Medicine | Admitting: Family Medicine

## 2018-12-25 ENCOUNTER — Other Ambulatory Visit: Payer: Self-pay

## 2018-12-25 DIAGNOSIS — Z20828 Contact with and (suspected) exposure to other viral communicable diseases: Secondary | ICD-10-CM

## 2018-12-25 DIAGNOSIS — Z20822 Contact with and (suspected) exposure to covid-19: Secondary | ICD-10-CM

## 2018-12-25 DIAGNOSIS — J069 Acute upper respiratory infection, unspecified: Secondary | ICD-10-CM

## 2018-12-25 NOTE — Discharge Instructions (Addendum)
Drink plenty of fluids Tylenol for pain and fever May use OTC cough and cold medicine Your result will come to you in My Chart

## 2018-12-25 NOTE — ED Provider Notes (Signed)
[facility]    CSN: [id number] Arrival date & time: 12/25/18  1114      History   Chief Complaint Chief Complaint  Patient presents with  . Headache    HPI Courtney [name] is a 28 y.o. female.   HPI  Patient is here with respiratory illness.  She has a sore throat, cough, headache, and chills for 3 days.  She has been feeling tired and somewhat achy.  No real muscle pain.  Uncertain exposure to coronavirus.  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  She did have a coronavirus test 2 weeks ago that was negative.  No one else at home is sick.  States that her grandmother has a "sinus infection". Patient acknowledges that because of her Remicade she is considered immune compromised.  She understands that working during the pandemic is somewhat of a risk to her health.  She states that she feels like she does not have a choice because she needs to work for financial reasons.  Past Medical History:  Diagnosis Date  . ADHD (attention deficit hyperactivity disorder)   . Chronic tonsillitis 08/2013   current strep, will finish antibiotic 09/04/2013; snores during sleep, mother denies apnea  . Family history of adverse reaction to anesthesia    " My cousin had problems waking up. "  . Heart murmur    "when I was born; outgrew it; I was a preemie"  . Hidradenitis   . Migraine 12/19/2016   "I've just had this one for 3 days" (12/20/2016)    Patient Active Problem List   Diagnosis Date Noted  . Influenza A 04/16/2018  . Acute pain of right shoulder 11/25/2017  . Left foot pain 04/23/2017  . Axillary hidradenitis suppurativa 04/03/2017  . Axillary abscess 12/20/2016  . Vitamin D deficiency 10/18/2014  . S/P tonsillectomy 09/14/2013  . ADHD (attention deficit hyperactivity disorder) 02/11/2012    Past Surgical History:  Procedure Laterality Date  . AXILLARY HIDRADENITIS  EXCISION    . CYSTOSCOPY W/ URETERAL STENT PLACEMENT Right 05/28/2017   Procedure: CYSTOSCOPY WITH RETROGRADE PYELOGRAM/ RIGHT URETERAL STENT PLACEMENT;  Surgeon: Crist Fat, MD;  Location: [facility];  Service: Urology;  Laterality: Right;  . HYDRADENITIS EXCISION Bilateral 04/03/2017   Procedure: EXCISION AND DRAINAGE BILATERAL  HIDRADENITIS AXILLA;  Surgeon: Griselda Miner, MD;  Location: [facility] OR;  Service: General;  Laterality: Bilateral;  . INCISION AND DRAINAGE ABSCESS Right 07/20/2016   Procedure: INCISION AND DRAINAGE ABSCESS right axilla;  Surgeon: Abigail Miyamoto, MD;  Location: [facility] OR;  Service: General;  Laterality: Right;  . INTRAUTERINE DEVICE (IUD) INSERTION  11/2016   "had the one in my left arm removed"  . IRRIGATION AND DEBRIDEMENT ABSCESS Right 12/21/2016   Procedure: IRRIGATION AND DEBRIDEMENT RIGHT AXILLARY HIDRADENITIS;  Surgeon: Andria Meuse, MD;  Location[facility];  Service: General;  Laterality: Right;  . IRRIGATION AND DEBRIDEMENT ABSCESS Left 01/08/2017   Procedure: IRRIGATION AND DEBRIDEMENT AXILLARY ABSCESS;  Surgeon: Griselda Miner, MD;  Location[facility] OR;  Service: General;  Laterality: Left;  . TONSILLECTOMY Bilateral 09/14/2013   Procedure: BILATERAL TONSILLECTOMY;  Surgeon: Serena Colonel, MD;  Location: [facility];  Service: ENT;  Laterality: Bilateral;  . TONSILLECTOMY    . TYMPANOPLASTY Bilateral    "rebuilt eardrums"  . WISDOM TOOTH EXTRACTION      OB History   No  obstetric history on file.      Home Medications    Prior to Admission medications   Medication Sig Start Date End Date Taking? Authorizing Provider  diphenhydrAMINE (BENADRYL) 25 mg capsule Take 25 mg by mouth every 6 (six) hours as needed for itching or allergies.    [provider]  EPINEPHrine (ADRENACLICK) 0.3 mg/0.3 mL IJ SOAJ injection Inject 0.3 mLs (0.3 mg total) into the muscle as needed for up to 2 doses for anaphylaxis. 04/06/18   Curatolo, Adam, DO   fluticasone (FLONASE) 50 MCG/ACT nasal spray Place 1 spray into both nostrils daily. 12/04/18   Dahlia Byes A, NP  ibuprofen (ADVIL,MOTRIN) 800 MG tablet Take 1 tablet (800 mg total) by mouth every 8 (eight) hours as needed. 04/09/17   Ward, Chase Picket, PA-C  inFLIXimab (REMICADE IV) Inject into the vein.    [provider]  Levonorgestrel (KYLEENA) 19.5 MG IUD 19.5 mg by Intrauterine route once.     [provider]  methylphenidate [facility] Octave) 20 MG/9HR Place 1 patch onto the skin daily. wear patch for 9 hours only each day 12/12/18   Dianne Dun, MD  montelukast (SINGULAIR) 10 MG tablet Take 1 tablet (10 mg total) by mouth at bedtime. 11/18/17   Dianne Dun, MD  ondansetron (ZOFRAN ODT) 4 MG disintegrating tablet Take 1 tablet (4 mg total) by mouth every 8 (eight) hours as needed for nausea or vomiting. 12/24/16   Focht, Joyce Copa, PA  rifampin (RIFADIN) 300 MG capsule Take 300 mg by mouth 2 (two) times daily. 12/18/17   [provider]  spironolactone (ALDACTONE) 100 MG tablet Take 1 tablet by mouth daily. 04/22/17   [provider]  albuterol (PROVENTIL HFA;VENTOLIN HFA) 108 (90 Base) MCG/ACT inhaler Inhale 2 puffs into the lungs every 6 (six) hours as needed. 11/18/17 12/08/18  Dianne Dun, MD  mometasone (NASONEX) 50 MCG/ACT nasal spray Place 2 sprays into the nose daily. Patient not taking: Reported on 05/13/2018 03/25/18 12/04/18  Eustace Moore, MD    Family History Family History  Problem Relation Age of Onset  . Hypertension Mother   . Hyperlipidemia Mother   . Diabetes Mother   . CAD Father     Social History Social History   Tobacco Use  . Smoking status: Never Smoker  . Smokeless tobacco: Never Used  Substance Use Topics  . Alcohol use: Yes    Comment: occasional  . Drug use: No     Allergies   Other, Shellfish allergy, Tapentadol, Tape, Morphine and related, Iodine, and Vancomycin   Review of Systems Review of Systems   Constitutional: Positive for chills and fatigue. Negative for fever.  HENT: Positive for congestion, rhinorrhea and sore throat. Negative for ear pain.   Eyes: Negative for pain and visual disturbance.  Respiratory: Positive for cough. Negative for shortness of breath.   Cardiovascular: Negative for chest pain and palpitations.  Gastrointestinal: Negative for abdominal pain and vomiting.  Genitourinary: Negative for dysuria and hematuria.  Musculoskeletal: Negative for arthralgias and back pain.  Skin: Negative for color change and rash.  Neurological: Positive for headaches. Negative for seizures and syncope.  All other systems reviewed and are negative.    Physical Exam Triage Vital Signs ED Triage Vitals  Enc Vitals Group     BP 12/25/18 1152 105/71     Pulse Rate 12/25/18 1152 83     Resp 12/25/18 1152 17     Temp 12/25/18 1152 97.7 F (  36.5 C)     Temp Source 12/25/18 1152 Tympanic     SpO2 12/25/18 1152 98 %     Weight 12/25/18 1150 170 lb (77.1 kg)     Height --      Head Circumference --      Peak Flow --      Pain Score 12/25/18 1150 5     Pain Loc --      Pain Edu? --      Excl. in GC? --    No data found.  Updated Vital Signs BP 105/71 (BP Location: Right Arm)   Pulse 83   Temp 97.7 F (36.5 C) (Tympanic)   Resp 17   Wt 77.1 kg   SpO2 98%   BMI 32.12 kg/m   Visual Acuity Right Eye Distance:   Left Eye Distance:   Bilateral Distance:    Right Eye Near:   Left Eye Near:    Bilateral Near:     Physical Exam Constitutional:      General: She is not in acute distress.    Appearance: She is well-developed.     Comments: Overweight.  Appears tired  HENT:     Head: Normocephalic and atraumatic.     Right Ear: Tympanic membrane and ear canal normal.     Left Ear: Tympanic membrane and ear canal normal.     Mouth/Throat:     Mouth: Mucous membranes are moist.     Pharynx: No posterior oropharyngeal erythema.  Eyes:     Extraocular Movements:  Extraocular movements intact.     Conjunctiva/sclera: Conjunctivae normal.     Pupils: Pupils are equal, round, and reactive to light.  Neck:     Musculoskeletal: Normal range of motion.  Cardiovascular:     Rate and Rhythm: Normal rate and regular rhythm.     Heart sounds: Normal heart sounds.  Pulmonary:     Effort: Pulmonary effort is normal. No respiratory distress.     Breath sounds: Normal breath sounds. No wheezing or rales.     Comments: Lungs are clear Abdominal:     General: There is no distension.     Palpations: Abdomen is soft.  Musculoskeletal: Normal range of motion.  Lymphadenopathy:     Cervical: No cervical adenopathy.  Skin:    General: Skin is warm and dry.  Neurological:     Mental Status: She is alert.  Psychiatric:        Mood and Affect: Mood normal.        Behavior: Behavior normal.      UC Treatments / Results  Labs (all labs ordered are listed, but only abnormal results are displayed) Labs Reviewed  NOVEL CORONAVIRUS, NAA (HOSP ORDER, SEND-OUT TO REF LAB; TAT 18-24 HRS)    EKG   Radiology No results found.  Procedures Procedures (including critical care time)  Medications Ordered in UC Medications - No data to display  Initial Impression / Assessment and Plan / UC Course  I have reviewed the triage vital signs and the nursing notes.  Pertinent labs & imaging results that were available during my care of the patient were reviewed by me and considered in my medical decision making (see chart for details).     I reviewed with the patient that this could be number of respiratory viruses.  Because of her position, I think testing for Covid is important.  She must quarantine until her test results are available. Final Clinical Impressions(s) / UC Diagnoses  Final diagnoses:  Acute upper respiratory infection  Suspected COVID-19 virus infection     Discharge Instructions     Drink plenty of fluids Tylenol for pain and fever May  use OTC cough and cold medicine Your result will come to you in My Chart   ED Prescriptions    None     PDMP not reviewed this encounter.   Raylene Everts, MD 12/25/18 [id number]

## 2018-12-25 NOTE — ED Triage Notes (Signed)
Pt states she has had a headache , x 3 days and chills, stuffy nose. X 3 days. Pt states she has had a Covid test it was negative 2 weeks ago.

## 2018-12-27 LAB — NOVEL CORONAVIRUS, NAA (HOSP ORDER, SEND-OUT TO REF LAB; TAT 18-24 HRS): SARS-CoV-2, NAA: NOT DETECTED

## 2019-01-06 ENCOUNTER — Encounter: Admit: 2019-01-06 | Discharge: 2019-01-07 | Payer: PRIVATE HEALTH INSURANCE

## 2019-01-06 DIAGNOSIS — L732 Hidradenitis suppurativa: Principal | ICD-10-CM

## 2019-01-06 HISTORY — PX: AXILLARY LYMPH NODE BIOPSY: SHX5737

## 2019-01-06 MED ORDER — OXYCODONE 5 MG TABLET
ORAL_TABLET | Freq: Three times a day (TID) | ORAL | 0 refills | 4.00000 days | Status: CP | PRN
Start: 2019-01-06 — End: ?

## 2019-01-14 ENCOUNTER — Other Ambulatory Visit: Payer: Self-pay | Admitting: Family Medicine

## 2019-01-14 MED ORDER — METHYLPHENIDATE 20 MG/9HR TD PTCH: 1.0000 | MEDICATED_PATCH | Freq: Every day | TRANSDERMAL | 0 refills | Status: DC

## 2019-01-14 NOTE — Telephone Encounter (Signed)
Requested medication (s) are due for refill today: yes  Requested medication (s) are on the active medication list: yes  Last refill: 12/12/2018  Future visit scheduled: yes  Notes to clinic:  Refill cannot be delegated    Requested Prescriptions  Pending Prescriptions Disp Refills   methylphenidate (DAYTRANA) 20 MG/9HR 30 patch 0    Sig: Place 1 patch onto the skin daily. wear patch for 9 hours only each day     Not Delegated - Psychiatry:  Stimulants/ADHD Failed - 01/14/2019 10:22 AM      Failed - This refill cannot be delegated      Failed - Urine Drug Screen completed in last 360 days.      Failed - Valid encounter within last 3 months    Recent Outpatient Visits          8 months ago Attention deficit hyperactivity disorder (ADHD), predominantly inattentive type   [facility] [place], Marciano Sequin, MD   9 months ago Influenza A   [facility] [place], Susan Moore, [place]   1 year ago Acute pain of right shoulder   [facility] [place] Razor, Enid Baas, MD   1 year ago Attention deficit hyperactivity disorder (ADHD), predominantly inattentive type   [facility] [place], Marciano Sequin, MD   1 year ago Left foot pain   [facility] [place], Marciano Sequin, [place]      Future Appointments            In 6 days Lucille Passy, MD [facility] [place], [facility]

## 2019-01-14 NOTE — Telephone Encounter (Signed)
Medication Refill - Medication: methylphenidate (DAYTRANA) 20 MG/9HR   Has the patient contacted their pharmacy? Yes.   (Agent: If no, request that the patient contact the pharmacy for the refill.) (Agent: If yes, when and what did the pharmacy advise?)Appt needed  Preferred Pharmacy (with phone number or street name):  [id number] [name], [place] [phone] (Phone) [phone] (Fax)     Agent: Please be advised that RX refills may take up to 3 business days. We ask that you follow-up with your pharmacy.

## 2019-01-19 ENCOUNTER — Encounter: Admit: 2019-01-19 | Discharge: 2019-01-20 | Payer: PRIVATE HEALTH INSURANCE

## 2019-01-19 DIAGNOSIS — L732 Hidradenitis suppurativa: Principal | ICD-10-CM

## 2019-01-20 ENCOUNTER — Ambulatory Visit (INDEPENDENT_AMBULATORY_CARE_PROVIDER_SITE_OTHER): Payer: BC Managed Care – PPO | Admitting: Family Medicine

## 2019-01-20 ENCOUNTER — Other Ambulatory Visit: Payer: Self-pay

## 2019-01-20 ENCOUNTER — Encounter: Payer: Self-pay | Admitting: Family Medicine

## 2019-01-20 VITALS — HR 83 | Temp 99.1°F | Ht 62.0 in | Wt 189.0 lb

## 2019-01-20 DIAGNOSIS — E559 Vitamin D deficiency, unspecified: Secondary | ICD-10-CM | POA: Diagnosis not present

## 2019-01-20 DIAGNOSIS — Z79899 Other long term (current) drug therapy: Secondary | ICD-10-CM

## 2019-01-20 DIAGNOSIS — F900 Attention-deficit hyperactivity disorder, predominantly inattentive type: Secondary | ICD-10-CM

## 2019-01-20 MED ORDER — METHYLPHENIDATE 20 MG/9HR TD PTCH: 1.0000 | MEDICATED_PATCH | Freq: Every day | TRANSDERMAL | 0 refills | Status: DC

## 2019-01-20 NOTE — Progress Notes (Signed)
Subjective:   Patient ID: Courtney [name], female    DOB: [birth year], 28 y.o.   MRN: [id number]  Courtney [name] is a pleasant 28 y.o. year old female who presents to clinic today with Follow-up (Pt is here today for a F/U. She currently takes Daytrana 20mg  1 patch daily and per [place] PMP she is compliant and last filled on 11.12.2020. She is due for her UDS & CSC today.)  on 01/20/2019  HPI:  Here to talk about Daytrana patch. Insurance will only pay for one patch per day - 20 mg daily dose. She says that the pharmacy told her the rx can't say DAW, which I do not see it says this on her last rx.  UDS, CSC due today.    Current Outpatient Medications on File Prior to Visit  Medication Sig Dispense Refill  . diphenhydrAMINE (BENADRYL) 25 mg capsule Take 25 mg by mouth every 6 (six) hours as needed for itching or allergies.    . drospirenone-ethinyl estradiol ([name]) 3-0.02 MG tablet [name] (28) 3 mg-0.02 mg tablet  TAKE 1 TABLET BY MOUTH EVERY DAY    . fluticasone (FLONASE) 50 MCG/ACT nasal spray Place 1 spray into both nostrils daily. 16 g 2  . ibuprofen (ADVIL,MOTRIN) 800 MG tablet Take 1 tablet (800 mg total) by mouth every 8 (eight) hours as needed. 21 tablet 0  . inFLIXimab (REMICADE IV) Inject into the vein.    . montelukast (SINGULAIR) 10 MG tablet Take 1 tablet (10 mg total) by mouth at bedtime. 90 tablet 2  . ondansetron (ZOFRAN ODT) 4 MG disintegrating tablet Take 1 tablet (4 mg total) by mouth every 8 (eight) hours as needed for nausea or vomiting. 20 tablet 0  . oxyCODONE-acetaminophen (PERCOCET/ROXICET) 5-325 MG tablet     . spironolactone (ALDACTONE) 100 MG tablet Take 1 tablet by mouth daily.    . [DISCONTINUED] albuterol (PROVENTIL HFA;VENTOLIN HFA) 108 (90 Base) MCG/ACT inhaler Inhale 2 puffs into the lungs every 6 (six) hours as needed. 1 Inhaler 0  . [DISCONTINUED] mometasone (NASONEX) 50 MCG/ACT nasal spray Place 2 sprays into the nose daily. (Patient not taking: Reported  on 05/13/2018) 17 g 0   No current facility-administered medications on file prior to visit.     Allergies  Allergen Reactions  . Other Hives    ABD pad causes rash Pt can only tolerate cloth tape  . Shellfish Allergy Anaphylaxis  . Tapentadol Hives and Other (See Comments)    Pt can only tolerate cloth tape  . Tape Hives and Other (See Comments)    Pt can only tolerate cloth tape  . Morphine And Related     " I become very angry"  . Iodine Nausea Only  . Vancomycin Other (See Comments)    Burns her vein really bad and always causes them to blow.    Past Medical History:  Diagnosis Date  . ADHD (attention deficit hyperactivity disorder)   . Chronic tonsillitis 08/2013   current strep, will finish antibiotic 09/04/2013; snores during sleep, mother denies apnea  . Family history of adverse reaction to anesthesia    " My cousin had problems waking up. "  . Heart murmur    "when I was born; outgrew it; I was a preemie"  . Hidradenitis   . Migraine 12/19/2016   "I've just had this one for 3 days" (12/20/2016)    Past Surgical History:  Procedure Laterality Date  . AXILLARY HIDRADENITIS EXCISION    .  AXILLARY LYMPH NODE BIOPSY Left 01/06/2019  . CYSTOSCOPY W/ URETERAL STENT PLACEMENT Right 05/28/2017   Procedure: CYSTOSCOPY WITH RETROGRADE PYELOGRAM/ RIGHT URETERAL STENT PLACEMENT;  Surgeon: Crist FatHerrick, Benjamin W, MD;  Location: [facility];  Service: Urology;  Laterality: Right;  . HYDRADENITIS EXCISION Bilateral 04/03/2017   Procedure: EXCISION AND DRAINAGE BILATERAL  HIDRADENITIS AXILLA;  Surgeon: Griselda Mineroth, Paul III, MD;  [facility];  Service: General;  Laterality: Bilateral;  . INCISION AND DRAINAGE ABSCESS Right 07/20/2016   Procedure: INCISION AND DRAINAGE ABSCESS right axilla;  Surgeon: Abigail MiyamotoBlackman, Douglas, MD;  [facility];  Service: General;  Laterality: Right;  . INTRAUTERINE DEVICE (IUD) INSERTION  11/2016   "had the one in my left arm removed"  . IRRIGATION AND DEBRIDEMENT  ABSCESS Right 12/21/2016   Procedure: IRRIGATION AND DEBRIDEMENT RIGHT AXILLARY HIDRADENITIS;  Surgeon: Andria MeuseWhite, Christopher M, MD;  Location: [facility];  Service: General;  Laterality: Right;  . IRRIGATION AND DEBRIDEMENT ABSCESS Left 01/08/2017   Procedure: IRRIGATION AND DEBRIDEMENT AXILLARY ABSCESS;  Surgeon: Griselda Mineroth, Paul III, MD;  [facility];  Service: General;  Laterality: Left;  . TONSILLECTOMY Bilateral 09/14/2013   Procedure: BILATERAL TONSILLECTOMY;  Surgeon: Serena ColonelJefry Rosen, MD;  Location: [facility];  Service: ENT;  Laterality: Bilateral;  . TONSILLECTOMY    . TYMPANOPLASTY Bilateral    "rebuilt eardrums"  . WISDOM TOOTH EXTRACTION      Family History  Problem Relation Age of Onset  . Hypertension Mother   . Hyperlipidemia Mother   . Diabetes Mother   . CAD Father     Social History   Socioeconomic History  . Marital status: Single    Spouse name: Not on file  . Number of children: Not on file  . Years of education: Not on file  . Highest education level: Not on file  Occupational History  . Not on file  Social Needs  . Financial resource strain: Not on file  . Food insecurity    Worry: Not on file    Inability: Not on file  . Transportation needs    Medical: Not on file    Non-medical: Not on file  Tobacco Use  . Smoking status: Never Smoker  . Smokeless tobacco: Never Used  Substance and Sexual Activity  . Alcohol use: Yes    Comment: occasional  . Drug use: No  . Sexual activity: Not on file  Lifestyle  . Physical activity    Days per week: Not on file    Minutes per session: Not on file  . Stress: Not on file  Relationships  . Social Musicianconnections    Talks on phone: Not on file    Gets together: Not on file    Attends religious service: Not on file    Active member of club or organization: Not on file    Attends meetings of clubs or organizations: Not on file    Relationship status: Not on file  . Intimate partner violence    Fear of  current or ex partner: Not on file    Emotionally abused: Not on file    Physically abused: Not on file    Forced sexual activity: Not on file  Other Topics Concern  . Not on file  Social History Narrative   Attends [employer]- getting a degree in business and criminal justice   The PMH, PSH, Social History, Family History, Medications, and allergies [facility], and have been updated if relevant.  Review of Systems  Respiratory: Negative.   Cardiovascular: Negative.   Neurological: Negative.   Psychiatric/Behavioral: Negative.   All other systems reviewed and are negative.      Objective:    Pulse 83   Temp 99.1 F (37.3 C) (Oral)   Ht 5\' 2"  (1.575 m)   Wt 189 lb (85.7 kg)   LMP 12/23/2018   SpO2 98%   BMI 34.57 kg/m    Physical Exam Vitals signs and nursing note reviewed.  Constitutional:      General: She is not in acute distress.    Appearance: Normal appearance.  HENT:     Head: Normocephalic and atraumatic.     Right Ear: External ear normal.     Left Ear: External ear normal.     Nose: Nose normal.     Mouth/Throat:     Mouth: Mucous membranes are moist.  Eyes:     Extraocular Movements: Extraocular movements intact.  Neck:     Musculoskeletal: Normal range of motion.  Cardiovascular:     Rate and Rhythm: Tachycardia present.     Pulses: Normal pulses.  Pulmonary:     Effort: Pulmonary effort is normal.  Musculoskeletal: Normal range of motion.  Skin:    General: Skin is warm and dry.  Neurological:     General: No focal deficit present.     Mental Status: She is alert and oriented to person, place, and time. Mental status is at baseline.  Psychiatric:        Mood and Affect: Mood normal.        Behavior: Behavior normal.        Thought Content: Thought content normal.        Judgment: Judgment normal.           Assessment & Plan:   Attention deficit hyperactivity disorder (ADHD), predominantly  inattentive type - Plan: Pain Mgmt, Profile 8 w/Conf, U  Vitamin D deficiency  Encounter for long-term (current) use of high-risk medication - Plan: Pain Mgmt, Profile 8 w/Conf, U No follow-ups on file.

## 2019-01-20 NOTE — Assessment & Plan Note (Signed)
>  15 minutes spent in face to face time with patient, >50% spent in counselling or coordination of care.  eRx refilled.  UDS/CSC updated today. PDMP reviewed- no red flag symptoms.

## 2019-01-21 LAB — PAIN MGMT, PROFILE 8 W/CONF, U
6 Acetylmorphine: NEGATIVE ng/mL
Alcohol Metabolites: NEGATIVE ng/mL (ref <500)
Amphetamines: NEGATIVE ng/mL
Benzodiazepines: NEGATIVE ng/mL
Buprenorphine, Urine: NEGATIVE ng/mL
Cocaine Metabolite: NEGATIVE ng/mL
Creatinine: 43.5 mg/dL
MDMA: NEGATIVE ng/mL
Marijuana Metabolite: NEGATIVE ng/mL
Opiates: NEGATIVE ng/mL
Oxidant: NEGATIVE ug/mL
Oxycodone: NEGATIVE ng/mL
pH: 6.6 (ref 4.5–9.0)

## 2019-01-22 DIAGNOSIS — J0140 Acute pansinusitis, unspecified: Secondary | ICD-10-CM | POA: Diagnosis not present

## 2019-01-26 ENCOUNTER — Ambulatory Visit: Admit: 2019-01-26 | Discharge: 2019-01-27 | Payer: PRIVATE HEALTH INSURANCE

## 2019-01-26 DIAGNOSIS — L732 Hidradenitis suppurativa: Principal | ICD-10-CM

## 2019-01-28 ENCOUNTER — Telehealth: Payer: Self-pay | Admitting: Family Medicine

## 2019-01-28 NOTE — Telephone Encounter (Signed)
appointment 12/9 Pt aware

## 2019-01-28 NOTE — Telephone Encounter (Signed)
Yes very sweet patient. Okay with me.

## 2019-01-28 NOTE — Telephone Encounter (Signed)
Ok with me 

## 2019-01-28 NOTE — Telephone Encounter (Signed)
Pt wanted to transfer care of  From dr Deborra Medina to dr cody.  Courtney [name] is closer to pt  Ok to schedule??

## 2019-02-10 DIAGNOSIS — J343 Hypertrophy of nasal turbinates: Secondary | ICD-10-CM | POA: Diagnosis not present

## 2019-02-10 DIAGNOSIS — J342 Deviated nasal septum: Secondary | ICD-10-CM | POA: Diagnosis not present

## 2019-02-10 DIAGNOSIS — J329 Chronic sinusitis, unspecified: Secondary | ICD-10-CM | POA: Diagnosis not present

## 2019-02-10 DIAGNOSIS — J0140 Acute pansinusitis, unspecified: Secondary | ICD-10-CM | POA: Diagnosis not present

## 2019-02-11 ENCOUNTER — Other Ambulatory Visit: Payer: Self-pay

## 2019-02-11 ENCOUNTER — Encounter: Payer: Self-pay | Admitting: Family Medicine

## 2019-02-11 ENCOUNTER — Ambulatory Visit (INDEPENDENT_AMBULATORY_CARE_PROVIDER_SITE_OTHER): Payer: BC Managed Care – PPO | Admitting: Family Medicine

## 2019-02-11 VITALS — BP 118/74 | HR 87 | Temp 98.0°F | Resp 20 | Ht 61.5 in | Wt 197.2 lb

## 2019-02-11 DIAGNOSIS — J4520 Mild intermittent asthma, uncomplicated: Secondary | ICD-10-CM | POA: Diagnosis not present

## 2019-02-11 DIAGNOSIS — F900 Attention-deficit hyperactivity disorder, predominantly inattentive type: Secondary | ICD-10-CM

## 2019-02-11 DIAGNOSIS — J45909 Unspecified asthma, uncomplicated: Secondary | ICD-10-CM | POA: Insufficient documentation

## 2019-02-11 DIAGNOSIS — R4586 Emotional lability: Secondary | ICD-10-CM | POA: Insufficient documentation

## 2019-02-11 MED ORDER — METHYLPHENIDATE HCL 10 MG PO TABS: 10.0000 mg | ORAL_TABLET | Freq: Three times a day (TID) | ORAL | 0 refills | Status: DC

## 2019-02-11 MED ORDER — ALBUTEROL SULFATE HFA 108 (90 BASE) MCG/ACT IN AERS: 2.0000 | INHALATION_SPRAY | Freq: Four times a day (QID) | RESPIRATORY_TRACT | 1 refills | Status: DC | PRN

## 2019-02-11 NOTE — Assessment & Plan Note (Signed)
Pt notes some irritability and anger whenever not on medication. Interested in being evaluated for possible bipolar disorder. Unclear if she has had a manic episode, but was told she was borderline bipolar in the past. Psych referral to further evaluate

## 2019-02-11 NOTE — Assessment & Plan Note (Signed)
Stable, however, needs to change medication due to insurance issues. Has tried and failed several medications over the years. Agreed to try Ritalin again and conversion from [place] reviewed. Will start with 10 mg BID and increase to TID. Psych referral if symptoms not well managed

## 2019-02-11 NOTE — Progress Notes (Signed)
Subjective:     ROCQUEL ASKREN is a 28 y.o. female presenting for Transfer of Care (from Dr Deborra Medina), ADHD (would like to discuss changing from patches to tablets. 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 The patient is not nervous/anxious.      Social History   Tobacco Use  Smoking Status Never Smoker  Smokeless Tobacco Never Used        Objective:    BP Readings from Last 3 Encounters:  02/11/19 118/74  12/25/18 105/71  12/08/18 117/73   Wt Readings from Last 3 Encounters:  02/11/19 197 lb 4 oz (89.5 kg)  01/20/19 189 lb (85.7 kg)  12/25/18 170 lb (77.1 kg)    BP 118/74   Pulse 87   Temp 98 F (36.7 C)   Resp 20   Ht 5' 1.5" (1.562 m)   Wt 197 lb 4 oz (89.5 kg)   LMP 12/25/2018 Comment: very irregular per patient  SpO2 98%   BMI 36.67 kg/m    Physical Exam Constitutional:      General: She is not in acute distress.    Appearance: She is well-developed. She is obese. She is not diaphoretic.  HENT:     Right Ear: External ear normal.     Left Ear: External ear normal.     Nose: Nose normal.  Eyes:     Conjunctiva/sclera: Conjunctivae normal.  Neck:     Musculoskeletal: Neck supple.  Cardiovascular:     Rate and Rhythm: Normal rate and regular rhythm.     Heart  sounds: No murmur.  Pulmonary:     Effort: Pulmonary effort is normal. No respiratory distress.     Breath sounds: Normal breath sounds.  Skin:    General: Skin is warm and dry.     Capillary Refill: Capillary refill takes less than 2 seconds.  Neurological:     Mental Status: She is alert. Mental status is at baseline.  Psychiatric:        Mood and Affect: Mood normal.        Behavior: Behavior normal.           Assessment & Plan:   Problem List Items Addressed This Visit      Respiratory   Reactive airway disease    Cont albuterol prn      Relevant Medications   albuterol (VENTOLIN HFA) 108 (90 Base) MCG/ACT inhaler     Other   ADHD (attention deficit hyperactivity disorder) - Primary    Stable, however, needs to change medication due to insurance issues. Has tried and failed several medications over the years. Agreed to try Ritalin again and conversion from [place] reviewed. Will start with 10 mg BID and increase to TID. Psych referral if symptoms not well  managed      Relevant Medications   methylphenidate (RITALIN) 10 MG tablet   Other Relevant Orders   Ambulatory referral to Psychiatry   Mood swings    Pt notes some irritability and anger whenever not on medication. Interested in being evaluated for possible bipolar disorder. Unclear if she has had a manic episode, but was told she was borderline bipolar in the past. Psych referral to further evaluate      Relevant Orders   Ambulatory referral to Psychiatry       Return in about 4 weeks (around 03/11/2019).  Lynnda Child, MD

## 2019-02-11 NOTE — Assessment & Plan Note (Signed)
Cont albuterol prn

## 2019-02-11 NOTE — Patient Instructions (Signed)
#  ADHD - start back on Ritalin - start with 10 mg twice daily - increase to three times a day if needed - psychiatry referral placed so if this does not work we have a next step

## 2019-02-19 ENCOUNTER — Other Ambulatory Visit: Payer: Self-pay

## 2019-02-19 ENCOUNTER — Encounter (HOSPITAL_BASED_OUTPATIENT_CLINIC_OR_DEPARTMENT_OTHER): Payer: Self-pay | Admitting: Otolaryngology

## 2019-02-19 ENCOUNTER — Encounter (HOSPITAL_BASED_OUTPATIENT_CLINIC_OR_DEPARTMENT_OTHER)
Admission: RE | Admit: 2019-02-19 | Discharge: 2019-02-19 | Disposition: A | Discharge status: Home / Self Care | Payer: BC Managed Care – PPO | Source: Ambulatory Visit | Attending: Otolaryngology | Admitting: Otolaryngology

## 2019-02-19 DIAGNOSIS — Z01812 Encounter for preprocedural laboratory examination: Secondary | ICD-10-CM | POA: Diagnosis not present

## 2019-02-19 LAB — BASIC METABOLIC PANEL
Anion gap: 12 (ref 5–15)
BUN: 7 mg/dL (ref 6–20)
CO2: 19 mmol/L — ABNORMAL LOW (ref 22–32)
Calcium: 9.1 mg/dL (ref 8.9–10.3)
Chloride: 106 mmol/L (ref 98–111)
Creatinine, Ser: 0.72 mg/dL (ref 0.44–1.00)
GFR calc Af Amer: >60 mL/min (ref >60)
GFR calc non Af Amer: >60 mL/min (ref >60)
Glucose, Bld: 110 mg/dL — ABNORMAL HIGH (ref 70–99)
Potassium: 4.2 mmol/L (ref 3.5–5.1)
Sodium: 137 mmol/L (ref 135–145)

## 2019-02-19 LAB — POCT PREGNANCY, URINE
Preg Test, Ur: NEGATIVE
Preg Test, Ur: NEGATIVE

## 2019-02-19 NOTE — H&P (Signed)
HPI:   Courtney [name] is a 28 y.o. female who presents as a return Patient.   Current problem: Nose and sinuses.  HPI: Follow-up. She completed the antibiotics and prednisone and feels a lot better but is still having chronic difficulty breathing through the left side which has been present for most of her life. No known history of trauma to the area.  PMH/Meds/All/SocHx/FamHx/ROS:   Past Medical History:  Diagnosis Date  . Arthritis  . Otitis externa of left ear  . Recurrent streptococcal tonsillitis   Past Surgical History:  Procedure Laterality Date  . EXTERNAL EAR SURGERY  . MOUTH SURGERY  . MYRINGOTOMY  . TONSILLECTOMY   No family history of bleeding disorders, wound healing problems or difficulty with anesthesia.   Social History   Socioeconomic History  . Marital status: Single  Spouse name: Not on file  . Number of children: Not on file  . Years of education: Not on file  . Highest education level: Not on file  Occupational History  . Not on file  Social Needs  . Financial resource strain: Not on file  . Food insecurity  Worry: Not on file  Inability: Not on file  . Transportation needs  Medical: Not on file  Non-medical: Not on file  Tobacco Use  . Smoking status: Never Smoker  . Smokeless tobacco: Never Used  Substance and Sexual Activity  . Alcohol use: Yes  Comment: some wine occassionally  . Drug use: Not on file  . Sexual activity: Not on file  Lifestyle  . Physical activity  Days per week: Not on file  Minutes per session: Not on file  . Stress: Not on file  Relationships  . Social Medical illustrator on phone: Not on file  Gets together: Not on file  Attends religious service: Not on file  Active member of club or organization: Not on file  Attends meetings of clubs or organizations: Not on file  Relationship status: Not on file  Other Topics Concern  . Not on file  Social History Narrative  . Not on file   Current Outpatient  Medications:  . albuterol (PROAIR HFA) 90 mcg/actuation inhaler, ProAir HFA 90 mcg/actuation aerosol inhaler, Disp: , Rfl:  . EPINEPHrine (EPI-PEN) 0.3 mg/0.3 mL auto-injector, Inject into the muscle., Disp: , Rfl:  . fluticasone propionate (FLONASE) 50 mcg/actuation nasal spray, PLACE 1 SPRAY INTO BOTH NOSTRILS DAILY., Disp: , Rfl:  . inFLIXimab (REMICADE) 100 mg injection, Infuse into the vein., Disp: , Rfl:  . methylphenidate (DAYTRANA) 20 mg/9 hr, Place 1 patch onto the skin daily. wear patch for 9 hours only each day, Disp: , Rfl:  . montelukast sodium (SINGULAIR ORAL), Take by mouth., Disp: , Rfl:  . Courtney [name], 30, 3-0.02 mg per tablet, TAKE 1 TABLET BY MOUTH EVERY DAY, Disp: , Rfl:  . predniSONE (DELTASONE) 20 MG tablet, Take 3 times a day for 3 days, two times a day for 3 days, one a day for 3 days., Disp: 18 tablet, Rfl: 0 . spironolactone (ALDACTONE) 100 MG tablet, spironolactone 100 mg tablet TAKE 1 TABLET BY MOUTH EVERY DAY, Disp: , Rfl:    Physical Exam:   Healthy appearing young lady, overweight, in no distress. She has a slightly hyponasal voice. No swelling around the face. Nasal exam reveals healthy and clear nasal mucosa on both sides. No more exudate. There is minimal mucosal edema. Significant leftward septal deviation with compensatory enlargement of the inferior turbinates bilaterally. Significant nasal obstruction on the  left is still present.  Independent Review of Additional Tests or Records:  Sinus CT imaging reveals no evidence of persistent or active mucosal disease within the paranasal sinuses. Septal deviation and large turbinates are nicely visualized.  Procedures:  none  Impression & Plans:  Clinically the sinusitis has resolved with medical treatment. The continued problems she is now having are anatomically related. Recommend consideration for nasal septoplasty and turbinate reduction to help with her nasal airways. Risks and benefits were discussed in detail. All  questions were answered. We will schedule at her convenience.

## 2019-02-21 ENCOUNTER — Other Ambulatory Visit (HOSPITAL_COMMUNITY)
Admission: RE | Admit: 2019-02-21 | Discharge: 2019-02-21 | Disposition: A | Discharge status: Home / Self Care | Payer: BC Managed Care – PPO | Source: Ambulatory Visit | Attending: Otolaryngology | Admitting: Otolaryngology

## 2019-02-21 DIAGNOSIS — Z01812 Encounter for preprocedural laboratory examination: Secondary | ICD-10-CM | POA: Insufficient documentation

## 2019-02-21 DIAGNOSIS — Z20828 Contact with and (suspected) exposure to other viral communicable diseases: Secondary | ICD-10-CM | POA: Insufficient documentation

## 2019-02-22 LAB — NOVEL CORONAVIRUS, NAA (HOSP ORDER, SEND-OUT TO REF LAB; TAT 18-24 HRS): SARS-CoV-2, NAA: NOT DETECTED

## 2019-02-25 ENCOUNTER — Ambulatory Visit (HOSPITAL_BASED_OUTPATIENT_CLINIC_OR_DEPARTMENT_OTHER)
Admission: RE | Admit: 2019-02-25 | Discharge: 2019-02-25 | Disposition: A | Discharge status: Home / Self Care | Payer: BC Managed Care – PPO | Attending: Otolaryngology | Admitting: Otolaryngology

## 2019-02-25 ENCOUNTER — Ambulatory Visit (HOSPITAL_BASED_OUTPATIENT_CLINIC_OR_DEPARTMENT_OTHER): Payer: BC Managed Care – PPO | Admitting: Certified Registered Nurse Anesthetist

## 2019-02-25 ENCOUNTER — Other Ambulatory Visit: Payer: Self-pay

## 2019-02-25 ENCOUNTER — Encounter (HOSPITAL_BASED_OUTPATIENT_CLINIC_OR_DEPARTMENT_OTHER): Payer: Self-pay | Admitting: Otolaryngology

## 2019-02-25 ENCOUNTER — Encounter (HOSPITAL_BASED_OUTPATIENT_CLINIC_OR_DEPARTMENT_OTHER)
Admission: RE | Disposition: A | Discharge status: Home / Self Care | Payer: Self-pay | Source: Home / Self Care | Attending: Otolaryngology

## 2019-02-25 DIAGNOSIS — F909 Attention-deficit hyperactivity disorder, unspecified type: Secondary | ICD-10-CM | POA: Insufficient documentation

## 2019-02-25 DIAGNOSIS — J343 Hypertrophy of nasal turbinates: Secondary | ICD-10-CM | POA: Insufficient documentation

## 2019-02-25 DIAGNOSIS — L732 Hidradenitis suppurativa: Secondary | ICD-10-CM | POA: Insufficient documentation

## 2019-02-25 DIAGNOSIS — J342 Deviated nasal septum: Secondary | ICD-10-CM | POA: Insufficient documentation

## 2019-02-25 DIAGNOSIS — M1990 Unspecified osteoarthritis, unspecified site: Secondary | ICD-10-CM | POA: Diagnosis not present

## 2019-02-25 DIAGNOSIS — Z79899 Other long term (current) drug therapy: Secondary | ICD-10-CM | POA: Insufficient documentation

## 2019-02-25 DIAGNOSIS — E559 Vitamin D deficiency, unspecified: Secondary | ICD-10-CM | POA: Diagnosis not present

## 2019-02-25 HISTORY — PX: NASAL SEPTOPLASTY W/ TURBINOPLASTY: SHX2070

## 2019-02-25 SURGERY — SEPTOPLASTY, NOSE, WITH NASAL TURBINATE REDUCTION
Anesthesia: General | Site: Nose | Laterality: Bilateral

## 2019-02-25 MED ORDER — DEXAMETHASONE SODIUM PHOSPHATE 10 MG/ML IJ SOLN
INTRAMUSCULAR | Status: DC | PRN
  Administered 2019-02-25: 8 mg via INTRAVENOUS

## 2019-02-25 MED ORDER — MIDAZOLAM HCL 5 MG/5ML IJ SOLN
INTRAMUSCULAR | Status: DC | PRN
  Administered 2019-02-25: 2 mg via INTRAVENOUS

## 2019-02-25 MED ORDER — OXYCODONE HCL 5 MG PO TABS
5.0000 mg | ORAL_TABLET | Freq: Once | ORAL | Status: AC | PRN
  Administered 2019-02-25: 5 mg via ORAL

## 2019-02-25 MED ORDER — MIDAZOLAM HCL 2 MG/2ML IJ SOLN
INTRAMUSCULAR | Status: AC
  Filled 2019-02-25: qty 2

## 2019-02-25 MED ORDER — OXYMETAZOLINE HCL 0.05 % NA SOLN
NASAL | Status: AC
  Filled 2019-02-25: qty 30

## 2019-02-25 MED ORDER — HYDROCODONE-ACETAMINOPHEN 7.5-325 MG PO TABS: 1.0000 | ORAL_TABLET | Freq: Four times a day (QID) | ORAL | 0 refills | Status: DC | PRN

## 2019-02-25 MED ORDER — LIDOCAINE 20MG/ML (2%) 15 ML SYRINGE OPTIME
INTRAMUSCULAR | Status: DC | PRN
  Administered 2019-02-25: 100 mg via INTRAVENOUS

## 2019-02-25 MED ORDER — PROPOFOL 10 MG/ML IV BOLUS
INTRAVENOUS | Status: AC
  Filled 2019-02-25: qty 20

## 2019-02-25 MED ORDER — LIDOCAINE 2% (20 MG/ML) 5 ML SYRINGE
INTRAMUSCULAR | Status: AC
  Filled 2019-02-25: qty 5

## 2019-02-25 MED ORDER — VANCOMYCIN HCL IN DEXTROSE 1-5 GM/200ML-% IV SOLN
INTRAVENOUS | Status: AC
  Filled 2019-02-25: qty 200

## 2019-02-25 MED ORDER — FENTANYL CITRATE (PF) 100 MCG/2ML IJ SOLN
INTRAMUSCULAR | Status: DC | PRN
  Administered 2019-02-25: 100 ug via INTRAVENOUS

## 2019-02-25 MED ORDER — SUGAMMADEX SODIUM 200 MG/2ML IV SOLN
INTRAVENOUS | Status: DC | PRN
  Administered 2019-02-25: 200 mg via INTRAVENOUS

## 2019-02-25 MED ORDER — DEXAMETHASONE SODIUM PHOSPHATE 10 MG/ML IJ SOLN
INTRAMUSCULAR | Status: AC
  Filled 2019-02-25: qty 1

## 2019-02-25 MED ORDER — OXYCODONE HCL 5 MG/5ML PO SOLN: 5.0000 mg | Freq: Once | ORAL | Status: AC | PRN

## 2019-02-25 MED ORDER — PROPOFOL 10 MG/ML IV BOLUS
INTRAVENOUS | Status: DC | PRN
  Administered 2019-02-25: 200 mg via INTRAVENOUS

## 2019-02-25 MED ORDER — ONDANSETRON HCL 4 MG/2ML IJ SOLN: 4.0000 mg | Freq: Once | INTRAMUSCULAR | Status: DC | PRN

## 2019-02-25 MED ORDER — BACITRACIN ZINC 500 UNIT/GM EX OINT
TOPICAL_OINTMENT | CUTANEOUS | Status: DC | PRN
  Administered 2019-02-25: 1 via TOPICAL

## 2019-02-25 MED ORDER — LIDOCAINE-EPINEPHRINE 1 %-1:100000 IJ SOLN
INTRAMUSCULAR | Status: DC | PRN
  Administered 2019-02-25: 6 mL

## 2019-02-25 MED ORDER — LACTATED RINGERS IV SOLN: INTRAVENOUS | Status: DC

## 2019-02-25 MED ORDER — BACITRACIN ZINC 500 UNIT/GM EX OINT
TOPICAL_OINTMENT | CUTANEOUS | Status: AC
  Filled 2019-02-25: qty 28.35

## 2019-02-25 MED ORDER — PROMETHAZINE HCL 25 MG RE SUPP: 25.0000 mg | Freq: Four times a day (QID) | RECTAL | 1 refills | Status: DC | PRN

## 2019-02-25 MED ORDER — FENTANYL CITRATE (PF) 100 MCG/2ML IJ SOLN
INTRAMUSCULAR | Status: AC
  Filled 2019-02-25: qty 2

## 2019-02-25 MED ORDER — ONDANSETRON HCL 4 MG/2ML IJ SOLN
INTRAMUSCULAR | Status: AC
  Filled 2019-02-25: qty 2

## 2019-02-25 MED ORDER — OXYMETAZOLINE HCL 0.05 % NA SOLN
NASAL | Status: DC | PRN
  Administered 2019-02-25: 1 via TOPICAL

## 2019-02-25 MED ORDER — ROCURONIUM BROMIDE 10 MG/ML (PF) SYRINGE
PREFILLED_SYRINGE | INTRAVENOUS | Status: DC | PRN
  Administered 2019-02-25: 60 mg via INTRAVENOUS

## 2019-02-25 MED ORDER — LIDOCAINE-EPINEPHRINE 1 %-1:100000 IJ SOLN
INTRAMUSCULAR | Status: AC
  Filled 2019-02-25: qty 1

## 2019-02-25 MED ORDER — FENTANYL CITRATE (PF) 100 MCG/2ML IJ SOLN
25.0000 ug | INTRAMUSCULAR | Status: DC | PRN
  Administered 2019-02-25: 50 ug via INTRAVENOUS

## 2019-02-25 MED ORDER — MUPIROCIN 2 % EX OINT
TOPICAL_OINTMENT | CUTANEOUS | Status: AC
  Filled 2019-02-25: qty 22

## 2019-02-25 MED ORDER — CEFAZOLIN SODIUM-DEXTROSE 2-4 GM/100ML-% IV SOLN
INTRAVENOUS | Status: AC
  Filled 2019-02-25: qty 100

## 2019-02-25 MED ORDER — ONDANSETRON HCL 4 MG/2ML IJ SOLN
INTRAMUSCULAR | Status: DC | PRN
  Administered 2019-02-25: 4 mg via INTRAVENOUS

## 2019-02-25 MED ORDER — OXYCODONE HCL 5 MG PO TABS
ORAL_TABLET | ORAL | Status: AC
  Filled 2019-02-25: qty 1

## 2019-02-25 SURGICAL SUPPLY — 37 items
ATTRACTOMAT 16X20 MAGNETIC DRP (DRAPES) IMPLANT
CANISTER SUCT 1200ML W/VALVE (MISCELLANEOUS) ×3 IMPLANT
CLOSURE WOUND 1/2 X4 (GAUZE/BANDAGES/DRESSINGS)
COAGULATOR SUCT 8FR VV (MISCELLANEOUS) IMPLANT
COVER WAND RF STERILE (DRAPES) IMPLANT
DECANTER SPIKE VIAL GLASS SM (MISCELLANEOUS) ×3 IMPLANT
DRSG NASOPORE 8CM (GAUZE/BANDAGES/DRESSINGS) IMPLANT
DRSG TELFA 3X8 NADH (GAUZE/BANDAGES/DRESSINGS) ×3 IMPLANT
ELECT REM PT RETURN 9FT ADLT (ELECTROSURGICAL) ×3
ELECTRODE REM PT RTRN 9FT ADLT (ELECTROSURGICAL) ×1 IMPLANT
GAUZE 4X4 16PLY RFD (DISPOSABLE) IMPLANT
GLOVE BIOGEL PI IND STRL 7.0 (GLOVE) ×1 IMPLANT
GLOVE BIOGEL PI INDICATOR 7.0 (GLOVE) ×2
GLOVE ECLIPSE 7.5 STRL STRAW (GLOVE) ×3 IMPLANT
GLOVE SURG SS PI 6.5 STRL IVOR (GLOVE) ×3 IMPLANT
GOWN STRL REUS W/ TWL LRG LVL3 (GOWN DISPOSABLE) ×1 IMPLANT
GOWN STRL REUS W/ TWL XL LVL3 (GOWN DISPOSABLE) ×1 IMPLANT
GOWN STRL REUS W/TWL LRG LVL3 (GOWN DISPOSABLE) ×2
GOWN STRL REUS W/TWL XL LVL3 (GOWN DISPOSABLE) ×2
HEMOSTAT SURGICEL .5X2 ABSORB (HEMOSTASIS) IMPLANT
NEEDLE PRECISIONGLIDE 27X1.5 (NEEDLE) ×3 IMPLANT
NS IRRIG 1000ML POUR BTL (IV SOLUTION) ×3 IMPLANT
PACK BASIN DAY SURGERY FS (CUSTOM PROCEDURE TRAY) ×3 IMPLANT
PACK ENT DAY SURGERY (CUSTOM PROCEDURE TRAY) ×3 IMPLANT
PATTIES SURGICAL .5 X3 (DISPOSABLE) ×3 IMPLANT
SHEET SILICONE 2X3 0.03 REINF (MISCELLANEOUS) IMPLANT
SLEEVE SCD COMPRESS KNEE MED (MISCELLANEOUS) ×3 IMPLANT
SPONGE GAUZE 2X2 8PLY STER LF (GAUZE/BANDAGES/DRESSINGS) ×1
SPONGE GAUZE 2X2 8PLY STRL LF (GAUZE/BANDAGES/DRESSINGS) ×2 IMPLANT
STRIP CLOSURE SKIN 1/2X4 (GAUZE/BANDAGES/DRESSINGS) IMPLANT
SUT CHROMIC 4 0 P 3 18 (SUTURE) ×3 IMPLANT
SUT ETHILON 3 0 PS 1 (SUTURE) IMPLANT
SUT ETHILON 4 0 CL P 3 (SUTURE) IMPLANT
SUT ETHILON 6 0 P 1 (SUTURE) IMPLANT
SUT PLAIN 4 0 ~~LOC~~ 1 (SUTURE) ×3 IMPLANT
TOWEL GREEN STERILE FF (TOWEL DISPOSABLE) ×3 IMPLANT
YANKAUER SUCT BULB TIP NO VENT (SUCTIONS) ×3 IMPLANT

## 2019-02-25 NOTE — Anesthesia Preprocedure Evaluation (Signed)
Anesthesia Evaluation  Patient identified by MRN, date of birth, ID band Patient awake    Reviewed: Allergy & Precautions, NPO status , Patient's Chart, lab work & pertinent test results  History of Anesthesia Complications Negative for: history of anesthetic complications  Airway Mallampati: II  TM Distance: >3 FB Neck ROM: Full    Dental  (+) Teeth Intact   Pulmonary neg pulmonary ROS,    Pulmonary exam normal        Cardiovascular negative cardio ROS Normal cardiovascular exam     Neuro/Psych negative neurological ROS  negative psych ROS   GI/Hepatic negative GI ROS, Neg liver ROS,   Endo/Other  negative endocrine ROS  Renal/GU negative Renal ROS  negative genitourinary   Musculoskeletal negative musculoskeletal ROS (+)   Abdominal   Peds  (+) ADHD Hematology negative hematology ROS (+)   Anesthesia Other Findings On remicade for hidradenitis  Reproductive/Obstetrics                             Anesthesia Physical Anesthesia Plan  ASA: II  Anesthesia Plan: General   Post-op Pain Management:    Induction: Intravenous  PONV Risk Score and Plan: 3 and Ondansetron, Dexamethasone, Treatment may vary due to age or medical condition and Midazolam  Airway Management Planned: Oral ETT  Additional Equipment: None  Intra-op Plan:   Post-operative Plan: Extubation in OR  Informed Consent: I have reviewed the patients History and Physical, chart, labs and discussed the procedure including the risks, benefits and alternatives for the proposed anesthesia with the patient or authorized representative who has indicated his/her understanding and acceptance.     Dental advisory given  Plan Discussed with:   Anesthesia Plan Comments:         Anesthesia Quick Evaluation

## 2019-02-25 NOTE — Anesthesia Postprocedure Evaluation (Signed)
Anesthesia Post Note  Patient: Courtney [name]  Procedure(s) Performed: NASAL SEPTOPLASTY WITH TURBINATE REDUCTION (Bilateral Nose)     Patient location during evaluation: PACU Anesthesia Type: General Level of consciousness: awake and alert Pain management: pain level controlled Vital Signs Assessment: post-procedure vital signs reviewed and stable Respiratory status: spontaneous breathing, nonlabored ventilation and respiratory function stable Cardiovascular status: blood pressure returned to baseline and stable Postop Assessment: no apparent nausea or vomiting Anesthetic complications: no    Last Vitals:  Vitals:   02/25/19 1130 02/25/19 1210  BP: (!) 142/91 111/82  Pulse:  88  Resp:  18  Temp:  37.1 C  SpO2:  97%    Last Pain:  Vitals:   02/25/19 1210  TempSrc:   PainSc: 4                  Lidia Collum

## 2019-02-25 NOTE — Discharge Instructions (Signed)
Avoid bending over, and avoid lifting anything heavy, in general avoid any physical activity.  Is normal to have some bloody drainage for the first couple of days.  It is normal to have very dry mouth because of the nasal packing.  A humidifier in your room can be helpful.  It is okay to use Motrin and Tylenol for pain.  It is also okay to use Motrin and the prescription pain medicine but do not use the prescription medicine with Tylenol.    Post Anesthesia Home Care Instructions  Activity: Get plenty of rest for the remainder of the day. A responsible individual must stay with you for 24 hours following the procedure.  For the next 24 hours, DO NOT: -Drive a car -Paediatric nurse -Drink alcoholic beverages -Take any medication unless instructed by your physician -Make any legal decisions or sign important papers.  Meals: Start with liquid foods such as gelatin or soup. Progress to regular foods as tolerated. Avoid greasy, spicy, heavy foods. If nausea and/or vomiting occur, drink only clear liquids until the nausea and/or vomiting subsides. Call your physician if vomiting continues.  Special Instructions/Symptoms: Your throat may feel dry or sore from the anesthesia or the breathing tube placed in your throat during surgery. If this causes discomfort, gargle with warm salt water. The discomfort should disappear within 24 hours.  If you had a scopolamine patch placed behind your ear for the management of post- operative nausea and/or vomiting:  1. The medication in the patch is effective for 72 hours, after which it should be removed.  Wrap patch in a tissue and discard in the trash. Wash hands thoroughly with soap and water. 2. You may remove the patch earlier than 72 hours if you experience unpleasant side effects which may include dry mouth, dizziness or visual disturbances. 3. Avoid touching the patch. Wash your hands with soap and water after contact with the patch.

## 2019-02-25 NOTE — Transfer of Care (Signed)
Immediate Anesthesia Transfer of Care Note  Patient: Courtney [name]  Procedure(s) Performed: NASAL SEPTOPLASTY WITH TURBINATE REDUCTION (Bilateral Nose)  Patient Location: PACU  Anesthesia Type:General  Level of Consciousness: awake, alert  and patient cooperative  Airway & Oxygen Therapy: Patient Spontanous Breathing and Patient connected to face mask oxygen  Post-op Assessment: Report given to RN, Post -op Vital signs reviewed and stable and Patient moving all extremities  Post vital signs: Reviewed and stable  Last Vitals:  Vitals Value Taken Time  BP 135/69 02/25/19 1056  Temp    Pulse 107 02/25/19 1059  Resp 15 02/25/19 1059  SpO2 97 % 02/25/19 1059  Vitals shown include unvalidated device data.  Last Pain:  Vitals:   02/25/19 0914  TempSrc: Temporal  PainSc: 0-No pain      Patients Stated Pain Goal: 5 (09/38/18 2993)  Complications: No apparent anesthesia complications

## 2019-02-25 NOTE — Interval H&P Note (Signed)
History and Physical Interval Note:  02/25/2019 9:24 AM  Courtney [name]  has presented today for surgery, with the diagnosis of devaited septum/turbinate hypertrophy.  The various methods of treatment have been discussed with the patient and family. After consideration of risks, benefits and other options for treatment, the patient has consented to  Procedure(s): NASAL SEPTOPLASTY WITH TURBINATE REDUCTION (Bilateral) as a surgical intervention.  The patient's history has been reviewed, patient examined, no change in status, stable for surgery.  I have reviewed the patient's chart and labs.  Questions were answered to the patient's satisfaction.     Izora Gala

## 2019-02-25 NOTE — Op Note (Signed)
OPERATIVE REPORT  DATE OF SURGERY: 02/25/2019  PATIENT:  Courtney [name],  28 y.o. female  PRE-OPERATIVE DIAGNOSIS:  deviated septum/turbinate hypertrophy  POST-OPERATIVE DIAGNOSIS:  deviated septum/turbinate hypertrophy  PROCEDURE:  Procedure(s): NASAL SEPTOPLASTY WITH TURBINATE REDUCTION  SURGEON:  Beckie Salts, MD  ASSISTANTS: none  ANESTHESIA:   General   EBL:  40 ml  DRAINS: none  LOCAL MEDICATIONS USED:  1% Xylocaine with epinephrine  SPECIMEN:  none  COUNTS:  Correct  PROCEDURE DETAILS: The patient was taken to the operating room and placed on the operating table in the supine position. Following induction of general endotracheal anesthesia, the nose was prepped and draped in a standard fashion. Afrin spray was used preoperatively in the holding area. 1% Xylocaine with epinephrine was infiltrated into the septum, the columella, and the inferior turbinates bilaterally.  1. Nasal septoplasty. A left hemitransfixion incision was created with a 15 scalpel. A mucoperichondrial flap was developed posteriorly down the left side of the nasal septum using a Cottle elevator. This was performed all the way to the sphenoid rostrum. The bony cartilaginous junction was divided and a similar flap was developed down the right side. The ethmoid plate was twisted in both directions and mostly resected.  The vomer was angulated towards the left causing a large spur and this was also removed using a 4 mm osteotome.  2. Submucous resection inferior turbinates bilaterally.  Inferior turbinates were large and partially obstructing.  The leading edge of the inferior turbinates were incised in a vertical fashion with a #15 scalpel. The Cottle elevator was used to elevate mucosa off bone in all directions. Fragments of the turbinate bone were resected with a Takahashi forceps. The turbinate remnants were outfractured with the Soil scientist.  All of these maneuvers greatly increased the nasal  airways bilaterally. The nasal cavities were packed with rolled up Telfa coated with bacitracin ointment. The pharynx was suctioned of blood and secretions under direct visualization. The patient was awakened extubated and transferred to recovery in stable condition.    PATIENT DISPOSITION:  To PACU, stable

## 2019-02-25 NOTE — Anesthesia Procedure Notes (Signed)
Procedure Name: Intubation Date/Time: 02/25/2019 10:02 AM Performed by: Lowella Dell, CRNA Pre-anesthesia Checklist: Patient identified, Emergency Drugs available, Suction available and Patient being monitored Patient Re-evaluated:Patient Re-evaluated prior to induction Oxygen Delivery Method: Circle System Utilized Preoxygenation: Pre-oxygenation with 100% oxygen Induction Type: IV induction Ventilation: Mask ventilation without difficulty Laryngoscope Size: Mac and 3 Grade View: Grade I Tube type: Oral Tube size: 7.0 mm Number of attempts: 1 Airway Equipment and Method: Stylet Placement Confirmation: ETT inserted through vocal cords under direct vision,  positive ETCO2 and breath sounds checked- equal and bilateral Secured at: 23 cm Tube secured with: Tape Dental Injury: Teeth and Oropharynx as per pre-operative assessment

## 2019-02-26 ENCOUNTER — Encounter: Payer: Self-pay | Admitting: *Deleted

## 2019-03-09 ENCOUNTER — Encounter: Admit: 2019-03-09 | Discharge: 2019-03-09 | Payer: PRIVATE HEALTH INSURANCE

## 2019-03-09 DIAGNOSIS — L732 Hidradenitis suppurativa: Principal | ICD-10-CM

## 2019-03-11 ENCOUNTER — Ambulatory Visit: Payer: BC Managed Care – PPO | Admitting: Family Medicine

## 2019-03-19 ENCOUNTER — Ambulatory Visit (INDEPENDENT_AMBULATORY_CARE_PROVIDER_SITE_OTHER): Payer: BC Managed Care – PPO | Admitting: Family Medicine

## 2019-03-19 ENCOUNTER — Encounter: Payer: Self-pay | Admitting: Family Medicine

## 2019-03-19 ENCOUNTER — Other Ambulatory Visit: Payer: Self-pay

## 2019-03-19 DIAGNOSIS — F900 Attention-deficit hyperactivity disorder, predominantly inattentive type: Secondary | ICD-10-CM

## 2019-03-19 DIAGNOSIS — E669 Obesity, unspecified: Secondary | ICD-10-CM

## 2019-03-19 MED ORDER — METHYLPHENIDATE HCL 10 MG PO TABS: 10.0000 mg | ORAL_TABLET | Freq: Three times a day (TID) | ORAL | 0 refills | Status: DC

## 2019-03-19 NOTE — Progress Notes (Signed)
   Subjective:     Courtney [name] is a 29 y.o. female presenting for ADHD (4 weeks follow up) and Discuss Keto diet     HPI   #ADHD - occasionally taking 3 pills per day - anger is less - focusing ok - when she takes 3 pills, difficulty sleeping - no appetite changes - planning to schedule her psych appointment soon  #Keto diet - goal is loose weight    Review of Systems   Social History   Tobacco Use  Smoking Status Never Smoker  Smokeless Tobacco Never Used        Objective:    BP Readings from Last 3 Encounters:  03/19/19 94/62  02/25/19 111/82  02/11/19 118/74   Wt Readings from Last 3 Encounters:  03/19/19 199 lb 8 oz (90.5 kg)  02/25/19 198 lb 13.7 oz (90.2 kg)  02/11/19 197 lb 4 oz (89.5 kg)    BP 94/62   Pulse (!) 103   Temp 98.4 F (36.9 C)   Ht 5' 1.5" (1.562 m)   Wt 199 lb 8 oz (90.5 kg)   SpO2 97%   BMI 37.08 kg/m    Physical Exam Constitutional:      General: She is not in acute distress.    Appearance: She is well-developed. She is not diaphoretic.  HENT:     Right Ear: External ear normal.     Left Ear: External ear normal.  Eyes:     Conjunctiva/sclera: Conjunctivae normal.  Cardiovascular:     Rate and Rhythm: Normal rate.  Pulmonary:     Effort: Pulmonary effort is normal.  Musculoskeletal:     Cervical back: Neck supple.  Skin:    General: Skin is warm and dry.     Capillary Refill: Capillary refill takes less than 2 seconds.  Neurological:     Mental Status: She is alert. Mental status is at baseline.  Psychiatric:        Mood and Affect: Mood normal.        Behavior: Behavior normal.        Adult ADHD Self Report Scale (most recent)    Adult ADHD Self-Report Scale (ASRS-v1.1) Symptom Checklist   No documentation.          Assessment & Plan:   Problem List Items Addressed This Visit      Other   ADHD (attention deficit hyperactivity disorder)    Improved on ritalin. Planning to schedule psych  appointment. Discussed trying to take 20 mg in the AM to see if that improves symptoms management but could continue to take 10 BID and occasional third pill prn.       Relevant Medications   methylphenidate (RITALIN) 10 MG tablet   methylphenidate (RITALIN) 10 MG tablet (Start on 04/19/2019)   methylphenidate (RITALIN) 10 MG tablet (Start on 05/17/2019)   Obesity (BMI 35.0-39.9 without comorbidity)    Discussed finding a diet which is sustainable. Making healthy choices and importance of calories in vs out.       Relevant Medications   methylphenidate (RITALIN) 10 MG tablet   methylphenidate (RITALIN) 10 MG tablet (Start on 04/19/2019)   methylphenidate (RITALIN) 10 MG tablet (Start on 05/17/2019)       Return in about 3 months (around 06/17/2019).  Lynnda Child, MD

## 2019-03-19 NOTE — Patient Instructions (Signed)
Here is what I would recommend:  1) Increase the amount of water you drink a day > specifically drink a glass of water 8 oz or more before every meal 2) Could start a fiber supplement (like metamucil) > You could take this up to 3 times a day, but I would start with 1 time a day until you get used to it. It may cause some stomach upset 3) Make sure you sit down to eat and eat slowly (cut meat one piece at a time) > specifically train your body to eat only at the table (avoiding snacking in front of the TV) 4) Fill up on healthy items first > consider eating a salad with low calorie salad dressing before every meal  5) Make 1/2 of your plate vegetables 6) Keep healthy snacks available for those times when you are bored 7) Consider using a calorie counting app like MyFitnessPal > counting calories helps you make wise choices around snacking. Or if you can afford it you could sign up for Weight Watchers -- and learn about healthy options through a point system   

## 2019-03-19 NOTE — Assessment & Plan Note (Signed)
Improved on ritalin. Planning to schedule psych appointment. Discussed trying to take 20 mg in the AM to see if that improves symptoms management but could continue to take 10 BID and occasional third pill prn.

## 2019-03-19 NOTE — Assessment & Plan Note (Signed)
Discussed finding a diet which is sustainable. Making healthy choices and importance of calories in vs out.

## 2019-03-30 ENCOUNTER — Encounter: Admit: 2019-03-30 | Discharge: 2019-03-31 | Payer: PRIVATE HEALTH INSURANCE

## 2019-03-30 DIAGNOSIS — Z79899 Other long term (current) drug therapy: Principal | ICD-10-CM

## 2019-03-30 DIAGNOSIS — L910 Hypertrophic scar: Principal | ICD-10-CM

## 2019-03-30 DIAGNOSIS — L732 Hidradenitis suppurativa: Principal | ICD-10-CM

## 2019-03-30 MED ORDER — CLINDAMYCIN HCL 300 MG CAPSULE
ORAL_CAPSULE | Freq: Two times a day (BID) | ORAL | 2 refills | 32.00000 days | Status: CP
Start: 2019-03-30 — End: ?

## 2019-04-20 ENCOUNTER — Encounter: Admit: 2019-04-20 | Discharge: 2019-04-21 | Payer: PRIVATE HEALTH INSURANCE

## 2019-04-20 DIAGNOSIS — Z79899 Other long term (current) drug therapy: Secondary | ICD-10-CM | POA: Diagnosis not present

## 2019-04-20 DIAGNOSIS — L732 Hidradenitis suppurativa: Secondary | ICD-10-CM | POA: Diagnosis not present

## 2019-04-29 ENCOUNTER — Telehealth: Payer: Self-pay | Admitting: Family Medicine

## 2019-04-29 NOTE — Telephone Encounter (Signed)
Patient called.  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 Patient will drop off form for Anastasiya to review.

## 2019-04-29 NOTE — Telephone Encounter (Signed)
Will wait for form to make sure nothing else is needed.

## 2019-04-29 NOTE — Telephone Encounter (Signed)
Pt dropped off information. Placed in RX tower.

## 2019-04-29 NOTE — Telephone Encounter (Signed)
Will review form tomorrow with Dr Selena Batten

## 2019-04-30 NOTE — Telephone Encounter (Signed)
Spoke with patient to let her know that she does not need labs prior to appointment. We need to go over all that is needed to be checked at the time of the visit

## 2019-04-30 NOTE — Telephone Encounter (Signed)
Labs 3/11 cpx 3/15 Pt stated pediatrician office has moved

## 2019-04-30 NOTE — Telephone Encounter (Signed)
Left message for patient to call back  Spoke with Dr Selena Batten. Patient needs a CPE appointment to discuss this with Dr Selena Batten and also do lab work for some of there requirements on the form. Patient can also reach out to her pediatrician she use to see and see if they have records of vaccines instead and then we can do Tb test here.

## 2019-05-14 ENCOUNTER — Other Ambulatory Visit: Payer: BC Managed Care – PPO

## 2019-05-18 ENCOUNTER — Other Ambulatory Visit: Payer: Self-pay

## 2019-05-18 ENCOUNTER — Encounter: Payer: Self-pay | Admitting: Family Medicine

## 2019-05-18 ENCOUNTER — Ambulatory Visit (INDEPENDENT_AMBULATORY_CARE_PROVIDER_SITE_OTHER): Payer: BC Managed Care – PPO | Admitting: Family Medicine

## 2019-05-18 VITALS — BP 100/72 | HR 104 | Temp 97.1°F | Ht 62.0 in | Wt 204.5 lb

## 2019-05-18 DIAGNOSIS — Z0000 Encounter for general adult medical examination without abnormal findings: Secondary | ICD-10-CM

## 2019-05-18 DIAGNOSIS — E669 Obesity, unspecified: Secondary | ICD-10-CM

## 2019-05-18 DIAGNOSIS — Z23 Encounter for immunization: Secondary | ICD-10-CM | POA: Diagnosis not present

## 2019-05-18 DIAGNOSIS — Z111 Encounter for screening for respiratory tuberculosis: Secondary | ICD-10-CM

## 2019-05-18 LAB — LIPID PANEL
Cholesterol: 207 mg/dL — ABNORMAL HIGH (ref 0–200)
HDL: 49.8 mg/dL (ref >39.00)
LDL Cholesterol: 119 mg/dL — ABNORMAL HIGH (ref 0–99)
NonHDL: 157.3
Total CHOL/HDL Ratio: 4
Triglycerides: 191 mg/dL — ABNORMAL HIGH (ref 0.0–149.0)
VLDL: 38.2 mg/dL (ref 0.0–40.0)

## 2019-05-18 LAB — HEMOGLOBIN A1C: Hgb A1c MFr Bld: 5.1 % (ref 4.6–6.5)

## 2019-05-18 MED ORDER — MONTELUKAST SODIUM 10 MG PO TABS: 10.0000 mg | ORAL_TABLET | Freq: Every day | ORAL | 3 refills | Status: DC

## 2019-05-18 NOTE — Progress Notes (Signed)
Annual Exam   Chief Complaint:  Chief Complaint  Patient presents with  . Annual Exam    vaccines-labs discussion    History of Present Illness:  Ms. Courtney [name] is a 29 y.o. No obstetric history on file. who LMP was Patient's last menstrual period was 05/14/2019., presents today for her annual examination.      Nutrition/Lifestyle Diet: weight watchers which has been helping Exercise: gym 2 times per week She does get adequate calcium and Vitamin D in her diet.  Social History   Tobacco Use  Smoking Status Never Smoker  Smokeless Tobacco Never Used   Social History   Substance and Sexual Activity  Alcohol Use Yes   Comment: occasional-about 2 times a month, 1 drink   Social History   Substance and Sexual Activity  Drug Use Not Currently   Comment: in high school     Safety The patient wears seatbelts: yes.     The patient feels safe at home and in their relationships: yes.  General Health Dentist in the last year: Yes Eye doctor: yes   Cervical Cancer Screening (Age 67-65) Last Pap:  2020 - abnormal with biopsy   Family History of Breast Cancer: no Family History of Ovarian Cancer: no   Sexual Health/Menses Her menses are irregular - will go months w/o period on continuous OCPs     Weight Wt Readings from Last 3 Encounters:  05/18/19 204 lb 8 oz (92.8 kg)  03/19/19 199 lb 8 oz (90.5 kg)  02/25/19 198 lb 13.7 oz (90.2 kg)   Patient has high BMI  BMI Readings from Last 1 Encounters:  05/18/19 37.40 kg/m     Chronic disease screening Blood pressure monitoring:  BP Readings from Last 3 Encounters:  05/18/19 100/72  03/19/19 94/62  02/25/19 111/82     Lipid Monitoring: Indication for screening: age >58, obesity, diabetes, family hx, CV risk factors.  Lipid screening: Yes  Lab Results  Component Value Date   CHOL 176 12/03/2016   HDL 36.90 (L) 12/03/2016   LDLCALC 116 (H) 12/03/2016   TRIG 118.0 12/03/2016   CHOLHDL 5  12/03/2016     Diabetes Screening: age >73, overweight, family hx, PCOS, hx of gestational diabetes, at risk ethnicity, elevated blood pressure >135/80.  Diabetes Screening screening: Yes  No results found for: HGBA1C    Past Medical History:  Diagnosis Date  . ADHD (attention deficit hyperactivity disorder)   . Chronic tonsillitis 08/2013   current strep, will finish antibiotic 09/04/2013; snores during sleep, mother denies apnea  . Family history of adverse reaction to anesthesia    " My cousin had problems waking up. "  . Heart murmur    "when I was born; outgrew it; I was a preemie"  . Hidradenitis   . Migraine 12/19/2016   "I've just had this one for 3 days" (12/20/2016)    Past Surgical History:  Procedure Laterality Date  . AXILLARY HIDRADENITIS EXCISION    . AXILLARY LYMPH NODE BIOPSY Left 01/06/2019  . CYSTOSCOPY W/ URETERAL STENT PLACEMENT Right 05/28/2017   Procedure: CYSTOSCOPY WITH RETROGRADE PYELOGRAM/ RIGHT URETERAL STENT PLACEMENT;  Surgeon: Crist Fat, MD;  Location: [facility];  Service: Urology;  Laterality: Right;  . HYDRADENITIS EXCISION Bilateral 04/03/2017   Procedure: EXCISION AND DRAINAGE BILATERAL  HIDRADENITIS AXILLA;  Surgeon: Griselda Miner, MD;  Location: [facility] OR;  Service: General;  Laterality: Bilateral;  . INCISION AND DRAINAGE ABSCESS Right 07/20/2016   Procedure: INCISION AND  DRAINAGE ABSCESS right axilla;  Surgeon: Abigail Miyamoto, MD;  Location: [facility] OR;  Service: General;  Laterality: Right;  . INTRAUTERINE DEVICE (IUD) INSERTION  11/2016   "had the one in my left arm removed"  . IRRIGATION AND DEBRIDEMENT ABSCESS Right 12/21/2016   Procedure: IRRIGATION AND DEBRIDEMENT RIGHT AXILLARY HIDRADENITIS;  Surgeon: Andria Meuse, MD;  Location: [facility] OR;  Service: General;  Laterality: Right;  . IRRIGATION AND DEBRIDEMENT ABSCESS Left 01/08/2017   Procedure: IRRIGATION AND DEBRIDEMENT AXILLARY ABSCESS;  Surgeon: Griselda Miner, MD;  Location: [facility]  OR;  Service: General;  Laterality: Left;  . NASAL SEPTOPLASTY W/ TURBINOPLASTY Bilateral 02/25/2019   Procedure: NASAL SEPTOPLASTY WITH TURBINATE REDUCTION;  Surgeon: Serena Colonel, MD;  Location: [facility];  Service: ENT;  Laterality: Bilateral;  . TONSILLECTOMY Bilateral 09/14/2013   Procedure: BILATERAL TONSILLECTOMY;  Surgeon: Serena Colonel, MD;  Location: [facility];  Service: ENT;  Laterality: Bilateral;  . TYMPANOPLASTY Bilateral    "rebuilt eardrums"  . WISDOM TOOTH EXTRACTION      Prior to Admission medications   Medication Sig Start Date End Date Taking? Authorizing Provider  albuterol (VENTOLIN HFA) 108 (90 Base) MCG/ACT inhaler Inhale 2 puffs into the lungs every 6 (six) hours as needed. 02/11/19  Yes Lynnda Child, MD  clindamycin (CLEOCIN) 300 MG capsule Take 300 mg by mouth 2 (two) times daily. 05/04/19  Yes [provider]  diphenhydrAMINE (BENADRYL) 25 mg capsule Take 25 mg by mouth every 6 (six) hours as needed for itching or allergies.    Yes [provider]  drospirenone-ethinyl estradiol ([name]) 3-0.02 MG tablet [name] (28) 3 mg-0.02 mg tablet  TAKE 1 TABLET BY MOUTH EVERY DAY   Yes [provider]  HYDROcodone-acetaminophen (NORCO) 7.5-325 MG tablet Take 1 tablet by mouth every 6 (six) hours as needed for moderate pain. 02/25/19  Yes Serena Colonel, MD  ibuprofen (ADVIL) 200 MG tablet Take 800 mg by mouth every 6 (six) hours as needed.   Yes [provider]  inFLIXimab (REMICADE IV) Inject into the vein.   Yes [provider]  methylphenidate (RITALIN) 10 MG tablet Take 1 tablet (10 mg total) by mouth 3 (three) times daily with meals. 03/19/19  Yes Lynnda Child, MD  methylphenidate (RITALIN) 10 MG tablet Take 1 tablet (10 mg total) by mouth 3 (three) times daily with meals. 04/19/19  Yes Lynnda Child, MD  methylphenidate (RITALIN) 10 MG tablet Take 1 tablet (10 mg total) by mouth 3 (three) times  daily. 05/17/19  Yes Lynnda Child, MD  montelukast (SINGULAIR) 10 MG tablet Take 1 tablet (10 mg total) by mouth at bedtime. 11/18/17  Yes Dianne Dun, MD  ondansetron (ZOFRAN ODT) 4 MG disintegrating tablet Take 1 tablet (4 mg total) by mouth every 8 (eight) hours as needed for nausea or vomiting. Patient taking differently: Take 4 mg by mouth every 8 (eight) hours as needed for nausea or vomiting.  12/24/16  Yes Focht, Nameer Summer L, PA  spironolactone (ALDACTONE) 100 MG tablet Take 1 tablet by mouth daily. 04/22/17  Yes [provider]  mometasone (NASONEX) 50 MCG/ACT nasal spray Place 2 sprays into the nose daily. Patient not taking: Reported on 05/13/2018 03/25/18 12/04/18  Eustace Moore, MD    Allergies  Allergen Reactions  . Other Hives    ABD pad causes rash Pt can only tolerate cloth tape Pt can only tolerate cloth tape  . Shellfish Allergy Anaphylaxis  .  Tapentadol Hives and Other (See Comments)    Pt can only tolerate cloth tape  . Tape Hives and Other (See Comments)    Pt can only tolerate cloth tape  . Amoxicillin-Pot Clavulanate Other (See Comments)  . Morphine And Related     " I become very angry"  . Iodine Nausea Only  . Vancomycin Other (See Comments)    Burns her vein really bad and always causes them to blow.    Gynecologic History: Patient's last menstrual period was 05/14/2019.  Obstetric History: No obstetric history on file.  Social History   Socioeconomic History  . Marital status: Significant Other    Spouse name: Not on file  . Number of children: Not on file  . Years of education: EMS/Fire training  . Highest education level: Not on file  Occupational History  . Not on file  Tobacco Use  . Smoking status: Never Smoker  . Smokeless tobacco: Never Used  Substance and Sexual Activity  . Alcohol use: Yes    Comment: occasional-about 2 times a month, 1 drink  . Drug use: Not Currently    Comment: in high school  . Sexual activity: Yes     Birth control/protection: Pill, Condom  Other Topics Concern  . 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 Difficulty of Paying Living Expenses: Not hard at all  Food Insecurity:   . Worried About Programme researcher, broadcasting/film/video in the Last Year:   . Barista in the Last Year:   Transportation Needs:   . Freight forwarder (Medical):   Marland Kitchen Lack of Transportation (Non-Medical):   Physical Activity:   . Days of Exercise per Week:   . Minutes of Exercise per Session:   Stress:   . Feeling of Stress :   Social Connections:   . Frequency of Communication with Friends and Family:   . Frequency of Social Gatherings with Friends and Family:   . Attends Religious Services:   . Active Member of Clubs or Organizations:   . Attends Banker Meetings:   Marland Kitchen Marital Status:   Intimate Partner Violence:   . Fear of Current or Ex-Partner:   . Emotionally Abused:   Marland Kitchen Physically Abused:   . Sexually Abused:     Family History  Problem Relation Age of Onset  . Hypertension Mother   . Hyperlipidemia Mother   . Diabetes Mother   . Heart attack Father 25  . Parkinson's disease Maternal Grandmother   . Dementia Maternal Grandmother   . Diabetes Maternal Grandmother   . Bell's palsy Maternal Grandmother     Review of Systems  Constitutional: Negative for chills and fever.  HENT: Negative for congestion and sore throat.   Eyes: Negative for blurred  vision and  double vision.  Respiratory: Negative for shortness of breath.   Cardiovascular: Negative for chest pain.  Gastrointestinal: Negative for heartburn, nausea and vomiting.  Genitourinary: Negative.   Musculoskeletal: Negative.  Negative for myalgias.  Skin: Negative for rash.  Neurological: Negative for dizziness and headaches.  Endo/Heme/Allergies: Does not bruise/bleed easily.  Psychiatric/Behavioral: Negative for depression. The patient is not nervous/anxious.      Physical Exam BP 100/72   Pulse (!) 104   Temp (!) 97.1 F (36.2 C)   Ht 5\' 2"  (1.575 m)   Wt 204 lb 8 oz (92.8 kg)   LMP 05/14/2019   SpO2 98%   BMI 37.40 kg/m    BP Readings from Last 3 Encounters:  05/18/19 100/72  03/19/19 94/62  02/25/19 111/82    Wt Readings from Last 3 Encounters:  05/18/19 204 lb 8 oz (92.8 kg)  03/19/19 199 lb 8 oz (90.5 kg)  02/25/19 198 lb 13.7 oz (90.2 kg)     Physical Exam Constitutional:      General: She is not in acute distress.    Appearance: She is well-developed. She is obese. She is not diaphoretic.  HENT:     Head: Normocephalic and atraumatic.     Right Ear: External ear normal.     Left Ear: External ear normal.     Nose: Nose normal.  Eyes:     General: No scleral icterus.    Conjunctiva/sclera: Conjunctivae normal.  Cardiovascular:     Rate and Rhythm: Normal rate and regular rhythm.     Heart sounds: No murmur.  Pulmonary:     Effort: Pulmonary effort is normal. No respiratory distress.     Breath sounds: Normal breath sounds. No wheezing.  Abdominal:     General: Bowel sounds are normal. There is no distension.     Palpations: Abdomen is soft. There is no mass.     Tenderness: There is no abdominal tenderness. There is no guarding or rebound.  Musculoskeletal:        General: Normal range of motion.     Cervical back: Neck supple.  Lymphadenopathy:     Cervical: No cervical adenopathy.  Skin:    General: Skin is warm and dry.     Capillary  Refill: Capillary refill takes less than 2 seconds.  Neurological:     Mental Status: She is alert and oriented to person, place, and time.     Deep Tendon Reflexes: Reflexes normal.  Psychiatric:        Behavior: Behavior normal.       Results: [facility] 01/20/2019 04/23/2017  Decreased Interest 1 0  Down, Depressed, Hopeless 1 -  PHQ - 2 Score 2 0  Altered sleeping 2 -  Tired, decreased energy 1 -  Change in appetite 1 -  Feeling bad or failure about yourself  0 -  Trouble concentrating 0 -  Moving slowly or fidgety/restless 0 -  Suicidal thoughts 0 -  PHQ-9 Score 6 -  Difficult doing work/chores Not difficult at all -      Assessment: 29 y.o. No obstetric history on file. female here for routine annual examination.  Plan: Problem List Items Addressed This Visit      Other   Obesity (BMI 35.0-39.9 without comorbidity)   Relevant Orders   Lipid panel   Hemoglobin A1c    Other Visit Diagnoses    Screening-pulmonary TB    -  Primary   Relevant Orders  PPD (Completed)   Annual physical exam       Relevant Orders   Lipid panel   Hemoglobin A1c   Need for varicella vaccine       Relevant Orders   Varicella vaccine subcutaneous (Completed)       Screening: -- Blood pressure screen normal -- cholesterol screening: will obtain -- Weight screening: overweight: continue to monitor -- Diabetes Screening: will obtain -- Nutrition: normal - encouraged continued working on healthy diet   Psych -- Depression screening (PHQ-9):    Office Visit from 01/20/2019 in [facility]  PHQ-9 Total Score  6       Safety -- tobacco screening: not using -- alcohol screening:  low-risk usage. -- no evidence of domestic violence or intimate partner violence.  Cancer Screening -- pap smear not collected per ASCCP guidelines -- family history of breast cancer screening: done. not at high risk.   Immunizations -- flu vaccine up to  date -- TDAP q10 years up to date -- Varicella Vaccine dose 2 today  Encouraged regular dental/vision care and healthy diet/exercise.     Lesleigh Noe

## 2019-05-18 NOTE — Patient Instructions (Addendum)
Return in 3 -72 hours for your TB skin check  Talk to your doctor about timing of the Covid vaccine with remicade   Preventive Care 27-27 Years Old, Female Preventive care refers to visits with your health care provider and lifestyle choices that can promote health and wellness. This includes:  A yearly physical exam. This may also be called an annual well check.  Regular dental visits and eye exams.  Immunizations.  Screening for certain conditions.  Healthy lifestyle choices, such as eating a healthy diet, getting regular exercise, not using drugs or products that contain nicotine and tobacco, and limiting alcohol use. What can I expect for my preventive care visit? Physical exam Your health care provider will check your:  Height and weight. This may be used to calculate body mass index (BMI), which tells if you are at a healthy weight.  Heart rate and blood pressure.  Skin for abnormal spots. Counseling Your health care provider may ask you questions about your:  Alcohol, tobacco, and drug use.  Emotional well-being.  Home and relationship well-being.  Sexual activity.  Eating habits.  Work and work Statistician.  Method of birth control.  Menstrual cycle.  Pregnancy history. What immunizations do I need?  Influenza (flu) vaccine  This is recommended every year. Tetanus, diphtheria, and pertussis (Tdap) vaccine  You may need a Td booster every 10 years. Varicella (chickenpox) vaccine  You may need this if you have not been vaccinated. Human papillomavirus (HPV) vaccine  If recommended by your health care provider, you may need three doses over 6 months. Measles, mumps, and rubella (MMR) vaccine  You may need at least one dose of MMR. You may also need a second dose. Meningococcal conjugate (MenACWY) vaccine  One dose is recommended if you are age 85-21 years and a first-year college student living in a residence hall, or if you have one of several  medical conditions. You may also need additional booster doses. Pneumococcal conjugate (PCV13) vaccine  You may need this if you have certain conditions and were not previously vaccinated. Pneumococcal polysaccharide (PPSV23) vaccine  You may need one or two doses if you smoke cigarettes or if you have certain conditions. Hepatitis A vaccine  You may need this if you have certain conditions or if you travel or work in places where you may be exposed to hepatitis A. Hepatitis B vaccine  You may need this if you have certain conditions or if you travel or work in places where you may be exposed to hepatitis B. Haemophilus influenzae type b (Hib) vaccine  You may need this if you have certain conditions. You may receive vaccines as individual doses or as more than one vaccine together in one shot (combination vaccines). Talk with your health care provider about the risks and benefits of combination vaccines. What tests do I need?  Blood tests  Lipid and cholesterol levels. These may be checked every 5 years starting at age 56.  Hepatitis C test.  Hepatitis B test. Screening  Diabetes screening. This is done by checking your blood sugar (glucose) after you have not eaten for a while (fasting).  Sexually transmitted disease (STD) testing.  BRCA-related cancer screening. This may be done if you have a family history of breast, ovarian, tubal, or peritoneal cancers.  Pelvic exam and Pap test. This may be done every 3 years starting at age 36. Starting at age 38, this may be done every 5 years if you have a Pap test in  combination with an HPV test. Talk with your health care provider about your test results, treatment options, and if necessary, the need for more tests. Follow these instructions at home: Eating and drinking   Eat a diet that includes fresh fruits and vegetables, whole grains, lean protein, and low-fat dairy.  Take vitamin and mineral supplements as recommended by  your health care provider.  Do not drink alcohol if: ? Your health care provider tells you not to drink. ? You are pregnant, may be pregnant, or are planning to become pregnant.  If you drink alcohol: ? Limit how much you have to 0-1 drink a day. ? Be aware of how much alcohol is in your drink. In the [place] one drink equals one 12 oz bottle of beer (355 mL), one 5 oz glass of wine (148 mL), or one 1 oz glass of hard liquor (44 mL). Lifestyle  Take daily care of your teeth and gums.  Stay active. Exercise for at least 30 minutes on 5 or more days each week.  Do not use any products that contain nicotine or tobacco, such as cigarettes, e-cigarettes, and chewing tobacco. If you need help quitting, ask your health care provider.  If you are sexually active, practice safe sex. Use a condom or other form of birth control (contraception) in order to prevent pregnancy and STIs (sexually transmitted infections). If you plan to become pregnant, see your health care provider for a preconception visit. What's next?  Visit your health care provider once a year for a well check visit.  Ask your health care provider how often you should have your eyes and teeth checked.  Stay up to date on all vaccines. This information is not intended to replace advice given to you by your health care provider. Make sure you discuss any questions you have with your health care provider. Document Revised: 10/31/2017 Document Reviewed: 10/31/2017 Elsevier Patient Education  2020 [employer].

## 2019-05-20 LAB — TB SKIN TEST
Induration: 0 mm
TB Skin Test: NEGATIVE

## 2019-05-26 ENCOUNTER — Other Ambulatory Visit: Payer: Self-pay | Admitting: Family Medicine

## 2019-05-26 DIAGNOSIS — J4520 Mild intermittent asthma, uncomplicated: Secondary | ICD-10-CM

## 2019-06-01 ENCOUNTER — Encounter: Admit: 2019-06-01 | Discharge: 2019-06-02 | Payer: PRIVATE HEALTH INSURANCE

## 2019-06-01 DIAGNOSIS — L732 Hidradenitis suppurativa: Secondary | ICD-10-CM | POA: Diagnosis not present

## 2019-06-18 DIAGNOSIS — Z23 Encounter for immunization: Secondary | ICD-10-CM | POA: Diagnosis not present

## 2019-06-29 DIAGNOSIS — H6591 Unspecified nonsuppurative otitis media, right ear: Secondary | ICD-10-CM | POA: Diagnosis not present

## 2019-06-29 DIAGNOSIS — H7291 Unspecified perforation of tympanic membrane, right ear: Secondary | ICD-10-CM | POA: Diagnosis not present

## 2019-06-29 DIAGNOSIS — H6502 Acute serous otitis media, left ear: Secondary | ICD-10-CM | POA: Diagnosis not present

## 2019-07-10 DIAGNOSIS — Z23 Encounter for immunization: Secondary | ICD-10-CM | POA: Diagnosis not present

## 2019-07-10 DIAGNOSIS — H9012 Conductive hearing loss, unilateral, left ear, with unrestricted hearing on the contralateral side: Secondary | ICD-10-CM | POA: Diagnosis not present

## 2019-07-10 DIAGNOSIS — H7292 Unspecified perforation of tympanic membrane, left ear: Secondary | ICD-10-CM | POA: Diagnosis not present

## 2019-07-13 ENCOUNTER — Ambulatory Visit: Admit: 2019-07-13 | Discharge: 2019-07-13 | Payer: PRIVATE HEALTH INSURANCE

## 2019-07-13 ENCOUNTER — Encounter: Admit: 2019-07-13 | Discharge: 2019-07-13 | Payer: PRIVATE HEALTH INSURANCE

## 2019-07-13 DIAGNOSIS — F411 Generalized anxiety disorder: Principal | ICD-10-CM

## 2019-07-13 DIAGNOSIS — J329 Chronic sinusitis, unspecified: Secondary | ICD-10-CM | POA: Diagnosis not present

## 2019-07-13 DIAGNOSIS — L732 Hidradenitis suppurativa: Secondary | ICD-10-CM | POA: Diagnosis not present

## 2019-07-13 DIAGNOSIS — Z87891 Personal history of nicotine dependence: Secondary | ICD-10-CM | POA: Diagnosis not present

## 2019-07-13 MED ORDER — DIAZEPAM 10 MG TABLET
ORAL_TABLET | 0 refills | 0 days | Status: CP
Start: 2019-07-13 — End: ?

## 2019-08-04 ENCOUNTER — Other Ambulatory Visit: Payer: Self-pay

## 2019-08-04 ENCOUNTER — Encounter: Payer: Self-pay | Admitting: Psychiatry

## 2019-08-04 ENCOUNTER — Telehealth: Payer: Self-pay

## 2019-08-04 ENCOUNTER — Telehealth (INDEPENDENT_AMBULATORY_CARE_PROVIDER_SITE_OTHER): Payer: BC Managed Care – PPO | Admitting: Psychiatry

## 2019-08-04 DIAGNOSIS — F3181 Bipolar II disorder: Secondary | ICD-10-CM | POA: Insufficient documentation

## 2019-08-04 DIAGNOSIS — F901 Attention-deficit hyperactivity disorder, predominantly hyperactive type: Secondary | ICD-10-CM | POA: Diagnosis not present

## 2019-08-04 DIAGNOSIS — Z9189 Other specified personal risk factors, not elsewhere classified: Secondary | ICD-10-CM | POA: Diagnosis not present

## 2019-08-04 MED ORDER — LAMOTRIGINE 25 MG PO TABS: 25.0000 mg | ORAL_TABLET | ORAL | 1 refills | Status: DC
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
## 2019-08-04 NOTE — Telephone Encounter (Signed)
faxed and confirmed the labwork orders  

## 2019-08-05 ENCOUNTER — Encounter: Payer: Self-pay | Admitting: Family Medicine

## 2019-08-05 ENCOUNTER — Other Ambulatory Visit: Payer: Self-pay

## 2019-08-05 ENCOUNTER — Ambulatory Visit (INDEPENDENT_AMBULATORY_CARE_PROVIDER_SITE_OTHER): Payer: BC Managed Care – PPO | Admitting: Family Medicine

## 2019-08-05 VITALS — BP 118/84 | HR 112 | Temp 98.5°F | Ht 62.0 in | Wt 201.0 lb

## 2019-08-05 DIAGNOSIS — F901 Attention-deficit hyperactivity disorder, predominantly hyperactive type: Secondary | ICD-10-CM | POA: Diagnosis not present

## 2019-08-05 DIAGNOSIS — Z5181 Encounter for therapeutic drug level monitoring: Secondary | ICD-10-CM | POA: Diagnosis not present

## 2019-08-05 DIAGNOSIS — Z9189 Other specified personal risk factors, not elsewhere classified: Secondary | ICD-10-CM

## 2019-08-05 LAB — TSH: TSH: 1.32 u[IU]/mL (ref 0.35–4.50)

## 2019-08-05 MED ORDER — METHYLPHENIDATE HCL 20 MG PO TABS: 20.0000 mg | ORAL_TABLET | Freq: Every day | ORAL | 0 refills | Status: DC

## 2019-08-05 MED ORDER — METHYLPHENIDATE HCL 10 MG PO TABS: 10.0000 mg | ORAL_TABLET | Freq: Every day | ORAL | 0 refills | Status: DC | PRN

## 2019-08-05 NOTE — Progress Notes (Signed)
   Subjective:     Courtney [name] is a 29 y.o. female presenting for ADHD     HPI  #ADHD - seeing psychiatry - recommended taking 20 mg AM and 10 mg PM prn - doing well with ADHD  - adjusting to her diagnosis of bipolar and her new medication   Review of Systems  08/05/2019: Psych - Bipolar - start Lamictal and melatonin. ADHD - cont ritalin 20 mg and prn 10 mg. Needs EKG    Social History   Tobacco Use  Smoking Status Never Smoker  Smokeless Tobacco Never Used        Objective:    BP Readings from Last 3 Encounters:  08/05/19 118/84  05/18/19 100/72  03/19/19 94/62   Wt Readings from Last 3 Encounters:  08/05/19 201 lb (91.2 kg)  05/18/19 204 lb 8 oz (92.8 kg)  03/19/19 199 lb 8 oz (90.5 kg)    BP 118/84   Pulse (!) 112   Temp 98.5 F (36.9 C) (Temporal)   Ht 5\' 2"  (1.575 m)   Wt 201 lb (91.2 kg)   SpO2 98%   BMI 36.76 kg/m    Physical Exam Constitutional:      General: She is not in acute distress.    Appearance: She is well-developed. She is not diaphoretic.  HENT:     Right Ear: External ear normal.     Left Ear: External ear normal.  Eyes:     Conjunctiva/sclera: Conjunctivae normal.  Cardiovascular:     Rate and Rhythm: Normal rate.  Pulmonary:     Effort: Pulmonary effort is normal.  Musculoskeletal:     Cervical back: Neck supple.  Skin:    General: Skin is warm and dry.     Capillary Refill: Capillary refill takes less than 2 seconds.  Neurological:     Mental Status: She is alert. Mental status is at baseline.  Psychiatric:        Mood and Affect: Mood normal.        Behavior: Behavior normal.      EKG: NSR, no ST changes, normal QT     Assessment & Plan:   Problem List Items Addressed This Visit      Other   Attention deficit hyperactivity disorder (ADHD), predominantly hyperactive type - Primary    Doing well. She has rare use of her 10 mg afternoon dose. Reviewed psychiatry recommendation of 20 mg AM dose and prn 10  mg. Discussed with pt and will do 20 mg AM #30 x 3 months and then 10 mg prn dose #30 and reassess in 3 months. Contract at next visit.       Relevant Medications   methylphenidate (RITALIN) 20 MG tablet   methylphenidate (RITALIN) 20 MG tablet (Start on 10/05/2019)   methylphenidate (RITALIN) 20 MG tablet (Start on 09/04/2019)   methylphenidate (RITALIN) 10 MG tablet   Other Relevant Orders   TSH   At risk for long QT syndrome    EKG done per psychiatry orders. Reviewed, normal appearing. Will also route not to psychiatry.        Other Visit Diagnoses    Therapeutic drug monitoring       Relevant Orders   TSH       Return in about 3 months (around 11/05/2019).  01/05/2020, MD

## 2019-08-05 NOTE — Assessment & Plan Note (Signed)
Doing well. She has rare use of her 10 mg afternoon dose. Reviewed psychiatry recommendation of 20 mg AM dose and prn 10 mg. Discussed with pt and will do 20 mg AM #30 x 3 months and then 10 mg prn dose #30 and reassess in 3 months. Contract at next visit.

## 2019-08-05 NOTE — Assessment & Plan Note (Signed)
EKG done per psychiatry orders. Reviewed, normal appearing. Will also route not to psychiatry.

## 2019-08-05 NOTE — Patient Instructions (Signed)
EKG looks good and should be reviewed by your psychiatrist.   -- Lab today for TSH check - I will send the results to your psychiatrist  We will do the contract at our follow-up in 3 months  New dose 20 mg morning  As needed 10 mg afternoon

## 2019-08-09 ENCOUNTER — Other Ambulatory Visit: Payer: Self-pay

## 2019-08-09 ENCOUNTER — Encounter (HOSPITAL_COMMUNITY): Payer: Self-pay

## 2019-08-09 ENCOUNTER — Ambulatory Visit (HOSPITAL_COMMUNITY)
Admission: EM | Admit: 2019-08-09 | Discharge: 2019-08-09 | Disposition: A | Discharge status: Home / Self Care | Payer: BC Managed Care – PPO | Attending: Physician Assistant | Admitting: Physician Assistant

## 2019-08-09 DIAGNOSIS — M25561 Pain in right knee: Secondary | ICD-10-CM | POA: Diagnosis not present

## 2019-08-09 MED ORDER — IBUPROFEN 800 MG PO TABS
800.0000 mg | ORAL_TABLET | Freq: Three times a day (TID) | ORAL | 0 refills | Status: DC
Start: 2019-08-09 — End: 2020-07-11

## 2019-08-09 NOTE — ED Triage Notes (Signed)
Pt reports right knee pain x 4 weeks, not know trauma, injury. Pt states the pain is worse today after she workout at the gym and the right knee started getting swollen. Tylenol gives some relief.

## 2019-08-09 NOTE — Discharge Instructions (Signed)
Take the ibuprofen every 8 hours for the next 3 to 4 days and then as needed You may also consider over-the-counter Voltaren gel once you have completed the ibuprofen  Wear your knee sleeve.  Perform quad stretching  Follow-up with the sports medicine office is for further evaluation of your knee pain.

## 2019-08-09 NOTE — ED Provider Notes (Signed)
[facility]    CSN: [id number] Arrival date & time: 08/09/19  1413      History   Chief Complaint Chief Complaint  Patient presents with  . Knee Pain    HPI Courtney [name] is a 29 y.o. female.   Patient presents for evaluation of right knee pain.  She reports this is been going on for about a month.  She reports today however was a lot worse after work out.  She reports the pain is in the front of her knee.  She reports it hurts with bending the knee.  She reports is worse when walking downstairs.  She does report occasional swelling in her knee.  She denies any  redness or warmth in the knee.  She has not any fevers or chills.  She reports no recent injuries to the knee.  She reports as a child she believed her kneecap was dislocated.  She works for the [employer] trade [employer] and is going to [employer].  She has been active lately and working out.     Past Medical History:  Diagnosis Date  . ADHD (attention deficit hyperactivity disorder)   . Chronic tonsillitis 08/2013   current strep, will finish antibiotic 09/04/2013; snores during sleep, mother denies apnea  . Family history of adverse reaction to anesthesia    " My cousin had problems waking up. "  . Heart murmur    "when I was born; outgrew it; I was a preemie"  . Hidradenitis   . Hidradenitis 2017  . Migraine 12/19/2016   "I've just had this one for 3 days" (12/20/2016)    Patient Active Problem List   Diagnosis Date Noted  . Bipolar 2 disorder, major depressive episode ([place]) 08/04/2019  . At risk for long QT syndrome 08/04/2019  . Perforation of left tympanic membrane 07/10/2019  . Obesity (BMI 35.0-39.9 without comorbidity) 03/19/2019  . Reactive airway disease 02/11/2019  . Mood swings 02/11/2019  . Nasal turbinate hypertrophy 02/10/2019  . Deviated nasal septum 02/10/2019  . Ear itch 11/04/2018  . Conductive hearing loss 03/13/2018  . Acute pain of right shoulder 11/25/2017  . Left foot pain  04/23/2017  . Axillary hidradenitis suppurativa 04/03/2017  . Axillary abscess 12/20/2016  . Vitamin D deficiency 10/18/2014  . S/P tonsillectomy 09/14/2013  . Attention deficit hyperactivity disorder (ADHD), predominantly hyperactive type 02/11/2012    Past Surgical History:  Procedure Laterality Date  . AXILLARY HIDRADENITIS EXCISION    . AXILLARY LYMPH NODE BIOPSY Left 01/06/2019  . CYSTOSCOPY W/ URETERAL STENT PLACEMENT Right 05/28/2017   Procedure: CYSTOSCOPY WITH RETROGRADE PYELOGRAM/ RIGHT URETERAL STENT PLACEMENT;  Surgeon: Ardis Hughs, MD;  Location: [facility];  Service: Urology;  Laterality: Right;  . HYDRADENITIS EXCISION Bilateral 04/03/2017   Procedure: EXCISION AND DRAINAGE BILATERAL  HIDRADENITIS AXILLA;  Surgeon: Jovita Kussmaul, MD;  Location: [facility];  Service: General;  Laterality: Bilateral;  . INCISION AND DRAINAGE ABSCESS Right 07/20/2016   Procedure: INCISION AND DRAINAGE ABSCESS right axilla;  Surgeon: Coralie Keens, MD;  Location: [facility];  Service: General;  Laterality: Right;  . INTRAUTERINE DEVICE (IUD) INSERTION  11/2016   "had the one in my left arm removed"  . IRRIGATION AND DEBRIDEMENT ABSCESS Right 12/21/2016   Procedure: IRRIGATION AND DEBRIDEMENT RIGHT AXILLARY HIDRADENITIS;  Surgeon: Ileana Roup, MD;  Location: [facility];  Service: General;  Laterality: Right;  . IRRIGATION AND DEBRIDEMENT ABSCESS Left 01/08/2017   Procedure: IRRIGATION AND DEBRIDEMENT AXILLARY ABSCESS;  Surgeon: Griselda Miner, MD;  Location[facility];  Service: General;  Laterality: Left;  . NASAL SEPTOPLASTY W/ TURBINOPLASTY Bilateral 02/25/2019   Procedure: NASAL SEPTOPLASTY WITH TURBINATE REDUCTION;  Surgeon: Serena Colonel, MD;  Location: [facility];  Service: ENT;  Laterality: Bilateral;  . TONSILLECTOMY Bilateral 09/14/2013   Procedure: BILATERAL TONSILLECTOMY;  Surgeon: Serena Colonel, MD;  Location: [facility];  Service: ENT;  Laterality: Bilateral;    . TYMPANOPLASTY Bilateral    "rebuilt eardrums"  . WISDOM TOOTH EXTRACTION      OB History   No obstetric history on file.      Home Medications    Prior to Admission medications   Medication Sig Start Date End Date Taking? Authorizing Provider  albuterol (VENTOLIN HFA) 108 (90 Base) MCG/ACT inhaler TAKE 2 PUFFS BY MOUTH EVERY 6 HOURS AS NEEDED 05/26/19   Lynnda Child, MD  clindamycin (CLEOCIN) 300 MG capsule Take 300 mg by mouth 2 (two) times daily. 05/04/19   [provider]  diazepam (VALIUM) 10 MG tablet Take 1 tablet 30 minutes prior to dermatology procedure 07/13/19   [provider]  diphenhydrAMINE (BENADRYL) 25 mg capsule Take 25 mg by mouth every 6 (six) hours as needed for itching or allergies.     [provider]  drospirenone-ethinyl estradiol ([name]) 3-0.02 MG tablet [name] (28) 3 mg-0.02 mg tablet  TAKE 1 TABLET BY MOUTH EVERY DAY    [provider]  ibuprofen (ADVIL) 800 MG tablet Take 1 tablet (800 mg total) by mouth 3 (three) times daily. 08/09/19   [name], Veryl Speak, PA-C  inFLIXimab (REMICADE IV) Inject into the vein.    [provider]  lamoTRIgine (LAMICTAL) 25 MG tablet Take 1-2 tablets (25-50 mg total) by mouth as directed. Tale 25 mg ( 1 tablet) for 2 weeks and increase to 50 mg ( 2 tablets) after that. 08/04/19   Jomarie Longs, MD  methylphenidate (RITALIN) 10 MG tablet Take 1 tablet (10 mg total) by mouth daily as needed (afternoon ADHD symptoms). 08/05/19   Lynnda Child, MD  methylphenidate (RITALIN) 20 MG tablet Take 1 tablet (20 mg total) by mouth daily with breakfast. 08/05/19   Lynnda Child, MD  methylphenidate (RITALIN) 20 MG tablet Take 1 tablet (20 mg total) by mouth daily with breakfast. 10/05/19   Lynnda Child, MD  methylphenidate (RITALIN) 20 MG tablet Take 1 tablet (20 mg total) by mouth daily with breakfast. 09/04/19   Lynnda Child, MD  montelukast (SINGULAIR) 10 MG tablet Take 1 tablet (10 mg total) by  mouth at bedtime. 05/18/19   Lynnda Child, MD  ondansetron (ZOFRAN ODT) 4 MG disintegrating tablet Take 1 tablet (4 mg total) by mouth every 8 (eight) hours as needed for nausea or vomiting. Patient taking differently: Take 4 mg by mouth every 8 (eight) hours as needed for nausea or vomiting.  12/24/16   Focht, Joyce Copa, PA  spironolactone (ALDACTONE) 100 MG tablet Take 1 tablet by mouth daily. 04/22/17   [provider]  mometasone (NASONEX) 50 MCG/ACT nasal spray Place 2 sprays into the nose daily. Patient not taking: Reported on 05/13/2018 03/25/18 12/04/18  Eustace Moore, MD    Family History Family History  Problem Relation Age of Onset  . Hypertension Mother   . Hyperlipidemia Mother   . Diabetes Mother   . Heart attack Father 108  . Parkinson's disease Maternal Grandmother   . Dementia Maternal Grandmother   .  Diabetes Maternal Grandmother   . Bell's palsy Maternal Grandmother   . Alcohol abuse Paternal Uncle     Social History Social History   Tobacco Use  . Smoking status: Never Smoker  . Smokeless tobacco: Never Used  Substance Use Topics  . Alcohol use: Yes    Comment: occasional-about 2 times a month, 1 drink  . Drug use: Not Currently    Comment: in high school     Allergies   Other, Shellfish allergy, Tapentadol, Tape, Amoxicillin-pot clavulanate, Morphine and related, Iodine, and Vancomycin   Review of Systems Review of Systems   Physical Exam Triage Vital Signs ED Triage Vitals  Enc Vitals Group     BP 08/09/19 1534 104/73     Pulse Rate 08/09/19 1534 92     Resp 08/09/19 1534 20     Temp 08/09/19 1534 98.5 F (36.9 C)     Temp Source 08/09/19 1534 Oral     SpO2 08/09/19 1534 100 %     Weight --      Height --      Head Circumference --      Peak Flow --      Pain Score 08/09/19 1533 6     Pain Loc --      Pain Edu? --      Excl. in GC? --    No data found.  Updated Vital Signs BP 104/73 (BP Location: Right Arm)   Pulse  92   Temp 98.5 F (36.9 C) (Oral)   Resp 20   LMP  (Within Months) Comment: 2 months  SpO2 100%   Visual Acuity Right Eye Distance:   Left Eye Distance:   Bilateral Distance:    Right Eye Near:   Left Eye Near:    Bilateral Near:     Physical Exam Vitals and nursing note reviewed.  Constitutional:      General: She is not in acute distress.    Appearance: Normal appearance.  HENT:     Head: Normocephalic and atraumatic.  Musculoskeletal:     Comments: Right knee : with mild effusion.  No ecchymosis, erythema.  Joint is not warm.  Patella is free-floating without much pain.  There is some anterior pain along the patella ligament and tendon.  There is pain with patellar compression.  There is pain with terminal flexion of the knee.  No crepitus along the joint line with McMurray's.  Anterior drawer and posterior drawer stable.  No lateral or medial joint line tenderness.  Skin:    General: Skin is warm and dry.  Neurological:     General: No focal deficit present.     Mental Status: She is alert and oriented to person, place, and time.      UC Treatments / Results  Labs (all labs ordered are listed, but only abnormal results are displayed) Labs Reviewed - No data to display  EKG   Radiology No results found.  Procedures Procedures (including critical care time)  Medications Ordered in UC Medications - No data to display  Initial Impression / Assessment and Plan / UC Course  I have reviewed the triage vital signs and the nursing notes.  Pertinent labs & imaging results that were available during my care of the patient were reviewed by me and considered in my medical decision making (see chart for details).     #Patellofemoral arthralgia Patient is a 29 year old presenting with suspected patellofemoral syndrome versus patellofemoral arthritis.  While this is  still in the acute phase, likely this will become a chronic issue given the history of a patellar  dislocation when she was young and clinical presentation today.  Discussed treatment with NSAIDs and Tylenol and use of knee sleeve.  Will follow up with sports medicine group.  Discussed quad stretching and importance for following up in order to possibly entered into physical therapy.  Patient verbalized understanding of the plan of care. Final Clinical Impressions(s) / UC Diagnoses   Final diagnoses:  Patellofemoral arthralgia of right knee     Discharge Instructions     Take the ibuprofen every 8 hours for the next 3 to 4 days and then as needed You may also consider over-the-counter Voltaren gel once you have completed the ibuprofen  Wear your knee sleeve.  Perform quad stretching  Follow-up with the sports medicine office is for further evaluation of your knee pain.      ED Prescriptions    Medication Sig Dispense Auth. Provider   ibuprofen (ADVIL) 800 MG tablet Take 1 tablet (800 mg total) by mouth 3 (three) times daily. 21 tablet Shekina Cordell, Veryl Speak, PA-C     PDMP not reviewed this encounter.   Hermelinda Medicus, PA-C 08/09/19 2130

## 2019-08-12 ENCOUNTER — Ambulatory Visit: Payer: Self-pay

## 2019-08-12 ENCOUNTER — Encounter: Payer: Self-pay | Admitting: Sports Medicine

## 2019-08-12 ENCOUNTER — Ambulatory Visit (INDEPENDENT_AMBULATORY_CARE_PROVIDER_SITE_OTHER): Payer: BC Managed Care – PPO | Admitting: Sports Medicine

## 2019-08-12 ENCOUNTER — Other Ambulatory Visit: Payer: Self-pay

## 2019-08-12 VITALS — BP 125/80 | Ht 61.0 in | Wt 180.0 lb

## 2019-08-12 DIAGNOSIS — M25561 Pain in right knee: Secondary | ICD-10-CM

## 2019-08-12 DIAGNOSIS — M7651 Patellar tendinitis, right knee: Secondary | ICD-10-CM | POA: Diagnosis not present

## 2019-08-12 NOTE — Progress Notes (Addendum)
   [facility] [address] [place], [place] [address] Phone: [phone] Fax: [phone]   Patient Name: Courtney [name] Date of Birth: 05-03-1990 Medical Record Number: [id number] Gender: female Date of Encounter: 08/12/2019  SUBJECTIVE:      Chief Complaint:  Right knee pain   HPI:  Courtney [name] is a 29 year old female presenting with 1 month of anterior right knee pain.  She went to an urgent care earlier this week after the pain increased after a workout.  Aggravating factors include flexion and going downstairs.  Intermittent swelling.  Subjective history of patella subluxation when she was younger.  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  Is pretty active at the gym and does a lot of weightlifting.     ROS:     See HPI.   PERTINENT  PMH / PSH / FH / SH:  Past Medical, Surgical, Social, and Family History Reviewed & Updated in the EMR. Pertinent findings include:  Vitamin D deficiency, obesity, bipolar 2    OBJECTIVE:  BP 125/80   Ht 5\' 1"  (1.549 m)   Wt 180 lb (81.6 kg)   BMI 34.01 kg/m  Physical Exam:  Vital signs are reviewed.   GEN: Alert and oriented, NAD Pulm: Breathing unlabored PSY: normal mood, congruent affect  MSK: Right knee: Small anterior extra-articular effusion TTP over patella tendon ROM full in flexion and extension and lower leg rotation. Ligaments with solid consistent endpoints including ACL, PCL, LCL, MCL. Negative Mcmurray's and Thessaly tests. painful patellar compression. Patellar glide without crepitus. Patellar and quadriceps tendons unremarkable. Hamstring and quadriceps strength is normal, but pain with resisted knee extension Neurovascularly intact.  MSK right Limited knee ultrasound performed,  Suprapatellar pouch visualized in long and short axis with no intra-articular abnormality. Quadriceps tendon visualized in both long and short axis with hypoechoic changes at  insertion Patellar Tendon is visualized in long and short axis small intrasubstance hypoechoic changes at origin Medial meniscus and MCL visualized with no abnormality Lateral mensicus and LCL visualized with no abnormality Popliteal fossa was visualized, no Baker's cyst   IMPRESSION:  Quadriceps tendon strain with patella tendinopathy   ASSESSMENT & PLAN:   1. Patella tendinopathy  Given the ultrasound findings and objective exam today, I do believe she has mild patellofemoral syndrome in the setting of patellar tendinitis.  She was given a home exercise program and will continue the ibuprofen she was prescribed.  Recommended she use a compression sleeve when working.  Ice cup to the tendon daily.  She will avoid any jumping, squatting, and lunges for the next month at which time she will follow-up.   , DO, [facility] Sports Medicine Fellow  Addendum:  I was the preceptor for this visit and available for immediate consultation.  Judge Stall MD [place]

## 2019-08-17 DIAGNOSIS — H7292 Unspecified perforation of tympanic membrane, left ear: Secondary | ICD-10-CM | POA: Diagnosis not present

## 2019-08-24 ENCOUNTER — Encounter: Admit: 2019-08-24 | Discharge: 2019-08-25 | Payer: PRIVATE HEALTH INSURANCE

## 2019-08-24 DIAGNOSIS — Z79899 Other long term (current) drug therapy: Secondary | ICD-10-CM | POA: Diagnosis not present

## 2019-08-24 DIAGNOSIS — L732 Hidradenitis suppurativa: Secondary | ICD-10-CM | POA: Diagnosis not present

## 2019-09-10 ENCOUNTER — Encounter: Payer: Self-pay | Admitting: Psychiatry

## 2019-09-10 ENCOUNTER — Telehealth (INDEPENDENT_AMBULATORY_CARE_PROVIDER_SITE_OTHER): Payer: BC Managed Care – PPO | Admitting: Psychiatry

## 2019-09-10 ENCOUNTER — Other Ambulatory Visit: Payer: Self-pay

## 2019-09-10 DIAGNOSIS — F3181 Bipolar II disorder: Secondary | ICD-10-CM

## 2019-09-10 DIAGNOSIS — Z9189 Other specified personal risk factors, not elsewhere classified: Secondary | ICD-10-CM | POA: Diagnosis not present

## 2019-09-10 DIAGNOSIS — F901 Attention-deficit hyperactivity disorder, predominantly hyperactive type: Secondary | ICD-10-CM

## 2019-09-10 MED ORDER — LAMOTRIGINE 25 MG PO TABS: 25.0000 mg | ORAL_TABLET | Freq: Two times a day (BID) | ORAL | 1 refills | Status: DC

## 2019-09-10 NOTE — Progress Notes (Signed)
Provider Location : [facility] Patient Location : Home  Virtual Visit via Video Note  I connected with Courtney [name] on 09/10/19 at 10:15 AM EDT by a video enabled telemedicine application and verified that I am speaking with the correct person using two identifiers.   I discussed the limitations of evaluation and management by telemedicine and the availability of in person appointments. The patient expressed understanding and agreed to proceed.     I discussed the assessment and treatment plan with the patient. The patient was provided an opportunity to ask questions and all were answered. The patient agreed with the plan and demonstrated an understanding of the instructions.   The patient was advised to call back or seek an in-person evaluation if the symptoms worsen or if the condition fails to improve as anticipated.  [facility] MD OP Progress Note  09/10/2019 12:23 PM Courtney [name]  MRN:  [id number]  Chief Complaint:  Chief Complaint    Follow-up     HPI: Courtney [name] is a 29 year old female, single, lives in [place], currently unemployed, has a history of bipolar type II, ADHD, migraine headaches, hidradenitis, was evaluated by telemedicine today.  Patient today reports she has noticed an improvement in her mood symptoms since being on the Lamictal.  She reports she is currently taking the Lamictal 50 mg p.o. daily.  She denies any side effects.  Her mood swings,  low energy and depressive symptoms as well as anxiety symptoms have improved.  She does have mild irritability on and off however overall it has improved.  She reports sleep as improved.  She reports appetite is fair.  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   Patient denies any suicidality, homicidality or perceptual disturbances.  Patient denies any substance abuse problems.  Reviewed and discussed TSH labs dated 08/05/2019-within normal limits  Reviewed and discussed EKG dated  08/05/2019-within normal limits     Visit Diagnosis:    ICD-10-CM   1. Bipolar 2 disorder, major depressive episode (HCC)  F31.81 lamoTRIgine (LAMICTAL) 25 MG tablet  2. Attention deficit hyperactivity disorder (ADHD), predominantly hyperactive type  F90.1   3. At risk for long QT syndrome  Z91.89     Past Psychiatric History: I have reviewed past psychiatric history from my progress note on 08/04/2019.  Ritalin, Daytrana patch, Adderall, risperidone, Concerta  Past Medical History:  Past Medical History:  Diagnosis Date  . ADHD (attention deficit hyperactivity disorder)   . Chronic tonsillitis 08/2013   current strep, will finish antibiotic 09/04/2013; snores during sleep, mother denies apnea  . Family history of adverse reaction to anesthesia    " My cousin had problems waking up. "  . Heart murmur    "when I was born; outgrew it; I was a preemie"  . Hidradenitis   . Hidradenitis 2017  . Migraine 12/19/2016   "I've just had this one for 3 days" (12/20/2016)    Past Surgical History:  Procedure Laterality Date  . AXILLARY HIDRADENITIS EXCISION    . AXILLARY LYMPH NODE BIOPSY Left 01/06/2019  . CYSTOSCOPY W/ URETERAL STENT PLACEMENT Right 05/28/2017   Procedure: CYSTOSCOPY WITH RETROGRADE PYELOGRAM/ RIGHT URETERAL STENT PLACEMENT;  Surgeon: Crist Fat, MD;  Location: [facility];  Service: Urology;  Laterality: Right;  . HYDRADENITIS EXCISION Bilateral 04/03/2017   Procedure: EXCISION AND DRAINAGE BILATERAL  HIDRADENITIS AXILLA;  Surgeon: Griselda Miner, MD;  Location: [facility] OR;  Service: General;  Laterality:  Bilateral;  . INCISION AND DRAINAGE ABSCESS Right 07/20/2016   Procedure: INCISION AND DRAINAGE ABSCESS right axilla;  Surgeon: Abigail MiyamotoBlackman, Douglas, MD;  [facility];  Service: General;  Laterality: Right;  . INTRAUTERINE DEVICE (IUD) INSERTION  11/2016   "had the one in my left arm removed"  . IRRIGATION AND DEBRIDEMENT ABSCESS Right 12/21/2016   Procedure: IRRIGATION AND  DEBRIDEMENT RIGHT AXILLARY HIDRADENITIS;  Surgeon: Andria MeuseWhite, Christopher M, MD;  Location: [facility] OR;  Service: General;  Laterality: Right;  . IRRIGATION AND DEBRIDEMENT ABSCESS Left 01/08/2017   Procedure: IRRIGATION AND DEBRIDEMENT AXILLARY ABSCESS;  Surgeon: Griselda Mineroth, Paul III, MD;  [facility];  Service: General;  Laterality: Left;  . NASAL SEPTOPLASTY W/ TURBINOPLASTY Bilateral 02/25/2019   Procedure: NASAL SEPTOPLASTY WITH TURBINATE REDUCTION;  Surgeon: Serena Colonelosen, Jefry, MD;  Location: [facility];  Service: ENT;  Laterality: Bilateral;  . TONSILLECTOMY Bilateral 09/14/2013   Procedure: BILATERAL TONSILLECTOMY;  Surgeon: Serena ColonelJefry Rosen, MD;  Location: [facility];  Service: ENT;  Laterality: Bilateral;  . TYMPANOPLASTY Bilateral    "rebuilt eardrums"  . WISDOM TOOTH EXTRACTION      Family Psychiatric History: I have reviewed family psychiatric history from my progress note on 08/04/2019  Family History:  Family History  Problem Relation Age of Onset  . Hypertension Mother   . Hyperlipidemia Mother   . Diabetes Mother   . Heart attack Father 3450  . Parkinson's disease Maternal Grandmother   . Dementia Maternal Grandmother   . Diabetes Maternal Grandmother   . Bell's palsy Maternal Grandmother   . Alcohol abuse Paternal Uncle     Social History: I have reviewed social history from my progress note on 08/04/2019 Social History   Socioeconomic History  . Marital status: Single    Spouse name: Not on file  . Number of children: Not on file  . Years of education: EMS/Fire training  . Highest education level: Not on file  Occupational History  . Not on file  Tobacco Use  . Smoking status: Never Smoker  . Smokeless tobacco: Never Used  Vaping Use  . Vaping Use: Former  Substance and Sexual Activity  . Alcohol use: Yes    Comment: occasional-about 2 times a month, 1 drink  . Drug use: Not Currently    Comment: in high school  . Sexual activity: Yes    Birth  control/protection: Pill, Condom  Other Topics Concern  . Not on file  Social History Narrative   02/11/19   From: the area   Living: with Mom currently, working to find her own place   Work: EMS/Fire      Family: good relationship with Mom and Grandma      Enjoys: spend time with friends/family, hunting, bomb fire      Exercise: every shift does training - 2-3 times a week   Diet: well rounded, meat heavy      Safety   Seat belts: Yes    Guns: Yes  and secure   Safe in relationships: Yes    Helmet: no    Social Determinants of Health   Financial Resource Strain: Low Risk   . Difficulty of Paying Living Expenses: Not hard at all  Food Insecurity:   . Worried About Programme researcher, broadcasting/film/videounning Out of Food in the Last Year:   . Baristaan Out of Food in the Last Year:   Transportation Needs:   . Freight forwarderLack of Transportation (Medical):   Marland Kitchen. Lack of Transportation (Non-Medical):  Physical Activity:   . Days of Exercise per Week:   . Minutes of Exercise per Session:   Stress:   . Feeling of Stress :   Social Connections:   . Frequency of Communication with Friends and Family:   . Frequency of Social Gatherings with Friends and Family:   . Attends Religious Services:   . Active Member of Clubs or Organizations:   . Attends Banker Meetings:   Marland Kitchen Marital Status:     Allergies:  Allergies  Allergen Reactions  . Other Hives    ABD pad causes rash Pt can only tolerate cloth tape Pt can only tolerate cloth tape  . Shellfish Allergy Anaphylaxis  . Tapentadol Hives and Other (See Comments)    Pt can only tolerate cloth tape  . Tape Hives and Other (See Comments)    Pt can only tolerate cloth tape  . Amoxicillin-Pot Clavulanate Other (See Comments)  . Morphine And Related     " I become very angry"  . Iodine Nausea Only  . Vancomycin Other (See Comments)    Burns her vein really bad and always causes them to blow.    Metabolic Disorder Labs: Lab Results  Component Value Date   HGBA1C  5.1 05/18/2019   No results found for: PROLACTIN Lab Results  Component Value Date   CHOL 207 (H) 05/18/2019   TRIG 191.0 (H) 05/18/2019   HDL 49.80 05/18/2019   CHOLHDL 4 05/18/2019   VLDL 38.2 05/18/2019   LDLCALC 119 (H) 05/18/2019   LDLCALC 116 (H) 12/03/2016   Lab Results  Component Value Date   TSH 1.32 08/05/2019   TSH 2.45 12/03/2016    Therapeutic Level Labs: No results found for: LITHIUM No results found for: VALPROATE No components found for:  CBMZ  Current Medications: Current Outpatient Medications  Medication Sig Dispense Refill  . albuterol (VENTOLIN HFA) 108 (90 Base) MCG/ACT inhaler TAKE 2 PUFFS BY MOUTH EVERY 6 HOURS AS NEEDED 18 g 5  . clindamycin (CLEOCIN) 300 MG capsule Take 300 mg by mouth 2 (two) times daily.    . diazepam (VALIUM) 10 MG tablet Take 1 tablet 30 minutes prior to dermatology procedure    . diphenhydrAMINE (BENADRYL) 25 mg capsule Take 25 mg by mouth every 6 (six) hours as needed for itching or allergies.     . drospirenone-ethinyl estradiol ([name]) 3-0.02 MG tablet [name] (28) 3 mg-0.02 mg tablet  TAKE 1 TABLET BY MOUTH EVERY DAY    . ibuprofen (ADVIL) 800 MG tablet Take 1 tablet (800 mg total) by mouth 3 (three) times daily. 21 tablet 0  . inFLIXimab (REMICADE IV) Inject into the vein.    Marland Kitchen lamoTRIgine (LAMICTAL) 25 MG tablet Take 1 tablet (25 mg total) by mouth 2 (two) times daily. 60 tablet 1  . methylphenidate (RITALIN) 10 MG tablet Take 1 tablet (10 mg total) by mouth daily as needed (afternoon ADHD symptoms). 30 tablet 0  . methylphenidate (RITALIN) 20 MG tablet Take 1 tablet (20 mg total) by mouth daily with breakfast. 30 tablet 0  . [START ON 10/05/2019] methylphenidate (RITALIN) 20 MG tablet Take 1 tablet (20 mg total) by mouth daily with breakfast. 30 tablet 0  . methylphenidate (RITALIN) 20 MG tablet Take 1 tablet (20 mg total) by mouth daily with breakfast. 30 tablet 0  . montelukast (SINGULAIR) 10 MG tablet Take 1 tablet (10 mg  total) by mouth at bedtime. 90 tablet 3  . ondansetron (ZOFRAN ODT) 4  MG disintegrating tablet Take 1 tablet (4 mg total) by mouth every 8 (eight) hours as needed for nausea or vomiting. (Patient taking differently: Take 4 mg by mouth every 8 (eight) hours as needed for nausea or vomiting. ) 20 tablet 0  . spironolactone (ALDACTONE) 100 MG tablet Take 1 tablet by mouth daily.     No current facility-administered medications for this visit.     Musculoskeletal: Strength & Muscle Tone: UTA Gait & Station: normal Patient leans: N/A  Psychiatric Specialty Exam: Review of Systems  Psychiatric/Behavioral: Negative for agitation, behavioral problems, confusion, decreased concentration, dysphoric mood, hallucinations, self-injury, sleep disturbance and suicidal ideas. The patient is not nervous/anxious and is not hyperactive.   All other systems reviewed and are negative.   There were no vitals taken for this visit.There is no height or weight on file to calculate BMI.  General Appearance: Casual  Eye Contact:  Fair  Speech:  Clear and Coherent  Volume:  Normal  Mood:  Euthymic  Affect:  Congruent  Thought Process:  Goal Directed and Descriptions of Associations: Intact  Orientation:  Full (Time, Place, and Person)  Thought Content: Logical   Suicidal Thoughts:  No  Homicidal Thoughts:  No  Memory:  Immediate;   Fair Recent;   Fair Remote;   Fair  Judgement:  Fair  Insight:  Fair  Psychomotor Activity:  Normal  Concentration:  Concentration: Fair and Attention Span: Fair  Recall:  [employer] of Knowledge: Fair  Language: Fair  Akathisia:  No  Handed:  Right  AIMS (if indicated): UTA  Assets:  Communication Skills Desire for Improvement Housing Social Support  ADL's:  Intact  Cognition: WNL  Sleep:  Fair   Screenings: GAD-7     Office Visit from 01/20/2019 in [facility]  Total GAD-7 Score 3    PHQ2-9     Office Visit from 01/20/2019 in [facility] Office Visit from 04/23/2017 in [facility]  PHQ-2 Total Score 2 0  PHQ-9 Total Score 6 --       Assessment and Plan: Courtney [name] is a 29 year old Caucasian female, currently works as a Engineer, water, lives in [place], has a history of mood swings, ADHD, hidradenitis, migraine headaches was evaluated by telemedicine today.  Patient is currently making progress on the current medication regimen.  Plan as noted below.  Plan Bipolar II disorder-improving Lamictal 50 mg p.o. daily Continue melatonin as needed for sleep.  ADHD-stable Ritalin 20 mg p.o. daily in the morning and 10 mg in the afternoon. She is currently prescribed Ritalin by her primary care provider. I have reviewed [place] controlled substance database  Reviewed and discussed TSH labs-08/05/2019-within normal limits  Reviewed and discussed EKG-08/05/2019-within normal limits  Follow-up in clinic in 6 to 8 weeks or sooner if needed.  I have spent atleast 20 minutes non face to face with patient today. More than 50 % of the time was spent for preparing to see the patient ( e.g., review of test, records ), ordering medications and test ,psychoeducation and supportive psychotherapy and care coordination,as well as documenting clinical information in electronic health record. This note was generated in part or whole with voice recognition software. Voice recognition is usually quite accurate but there are transcription errors that can and very often do occur. I apologize for any typographical errors that were not detected and corrected.        Jomarie Longs, MD 09/10/2019, 12:23 PM

## 2019-09-14 ENCOUNTER — Encounter: Admit: 2019-09-14 | Discharge: 2019-09-15 | Payer: PRIVATE HEALTH INSURANCE

## 2019-09-14 DIAGNOSIS — L732 Hidradenitis suppurativa: Secondary | ICD-10-CM | POA: Diagnosis not present

## 2019-09-14 MED ORDER — OXYCODONE 5 MG TABLET
ORAL_TABLET | 0 refills | 0 days | Status: CP
Start: 2019-09-14 — End: ?

## 2019-09-14 MED ORDER — CLINDAMYCIN HCL 300 MG CAPSULE
ORAL_CAPSULE | Freq: Two times a day (BID) | ORAL | 2 refills | 32.00000 days | Status: CP
Start: 2019-09-14 — End: ?

## 2019-10-05 ENCOUNTER — Encounter: Admit: 2019-10-05 | Discharge: 2019-10-06 | Payer: PRIVATE HEALTH INSURANCE

## 2019-10-05 DIAGNOSIS — L732 Hidradenitis suppurativa: Secondary | ICD-10-CM | POA: Diagnosis not present

## 2019-11-05 DIAGNOSIS — Z01419 Encounter for gynecological examination (general) (routine) without abnormal findings: Secondary | ICD-10-CM | POA: Diagnosis not present

## 2019-11-05 DIAGNOSIS — Z8742 Personal history of other diseases of the female genital tract: Secondary | ICD-10-CM | POA: Diagnosis not present

## 2019-11-05 DIAGNOSIS — Z6838 Body mass index (BMI) 38.0-38.9, adult: Secondary | ICD-10-CM | POA: Diagnosis not present

## 2019-11-05 LAB — HM PAP SMEAR: HM Pap smear: NEGATIVE

## 2019-11-10 ENCOUNTER — Other Ambulatory Visit: Payer: Self-pay | Admitting: *Deleted

## 2019-11-10 DIAGNOSIS — F901 Attention-deficit hyperactivity disorder, predominantly hyperactive type: Secondary | ICD-10-CM

## 2019-11-10 MED ORDER — METHYLPHENIDATE HCL 10 MG PO TABS: 10.0000 mg | ORAL_TABLET | Freq: Every day | ORAL | 0 refills | Status: DC | PRN

## 2019-11-10 NOTE — Telephone Encounter (Signed)
Refill request Name of Medication: Ritalin Name of Pharmacy: [facility] [address] [facility] [place] or Written Date and Quantity: 10/05/19 #30 Last Office Visit and Type: 08/05/19 Next Office Visit and Type: 11/17/19 Last Controlled Substance Agreement Date: 11/18/17 Last UDS:01/20/19

## 2019-11-10 NOTE — Telephone Encounter (Signed)
One month refill provided

## 2019-11-11 ENCOUNTER — Other Ambulatory Visit: Payer: Self-pay

## 2019-11-11 ENCOUNTER — Encounter: Payer: Self-pay | Admitting: Psychiatry

## 2019-11-11 ENCOUNTER — Telehealth (INDEPENDENT_AMBULATORY_CARE_PROVIDER_SITE_OTHER): Payer: BC Managed Care – PPO | Admitting: Psychiatry

## 2019-11-11 DIAGNOSIS — F3181 Bipolar II disorder: Secondary | ICD-10-CM

## 2019-11-11 DIAGNOSIS — F901 Attention-deficit hyperactivity disorder, predominantly hyperactive type: Secondary | ICD-10-CM | POA: Diagnosis not present

## 2019-11-11 MED ORDER — LAMOTRIGINE 25 MG PO TABS: 75.0000 mg | ORAL_TABLET | ORAL | 1 refills | Status: DC

## 2019-11-11 NOTE — Progress Notes (Signed)
Provider Location : [facility] Patient Location : Home  Participants: Patient , Provider  Virtual Visit via Video Note  I connected with Courtney [name] on 11/11/19 at  4:00 PM EDT by a video enabled telemedicine application and verified that I am speaking with the correct person using two identifiers.   I discussed the limitations of evaluation and management by telemedicine and the availability of in person appointments. The patient expressed understanding and agreed to proceed.    I discussed the assessment and treatment plan with the patient. The patient was provided an opportunity to ask questions and all were answered. The patient agreed with the plan and demonstrated an understanding of the instructions.   The patient was advised to call back or seek an in-person evaluation if the symptoms worsen or if the condition fails to improve as anticipated.   BH MD OP Progress Note  11/11/2019 4:16 PM Courtney [name]  MRN:  [id number]  Chief Complaint:  Chief Complaint    Follow-up     HPI: Courtney [name] is a 29 year old female, single, lives in [place], has a history of bipolar disorder type II, ADHD, migraine headaches, hidradenitis was evaluated by telemedicine today.  Patient today reports she is currently struggling with mood swings, irritability since the past few days.  She reports she has these irritable episodes at least 3-4 times per week.  She is interested in a dosage increase of her Lamictal.  She is tolerating the current dosage well.  She reports sleep and appetite as good.  She denies any suicidality, homicidality or perceptual disturbances.  Patient reports she continues to take the Ritalin and her focus and concentration continues to be good.  She was able to pass her state exam and currently has a job that she starts on the 16th with [employer].  She is excited about the same.  She denies any other concerns today. Visit Diagnosis:    ICD-10-CM   1.  Bipolar 2 disorder, major depressive episode (HCC)  F31.81 lamoTRIgine (LAMICTAL) 25 MG tablet  2. Attention deficit hyperactivity disorder (ADHD), predominantly hyperactive type  F90.1     Past Psychiatric History: I have reviewed past psychiatric history from my progress note on 08/04/2019.  Ritalin, Daytrana patch, Adderall, risperidone, Concerta  Past Medical History:  Past Medical History:  Diagnosis Date  . ADHD (attention deficit hyperactivity disorder)   . Chronic tonsillitis 08/2013   current strep, will finish antibiotic 09/04/2013; snores during sleep, mother denies apnea  . Family history of adverse reaction to anesthesia    " My cousin had problems waking up. "  . Heart murmur    "when I was born; outgrew it; I was a preemie"  . Hidradenitis   . Hidradenitis 2017  . Migraine 12/19/2016   "I've just had this one for 3 days" (12/20/2016)    Past Surgical History:  Procedure Laterality Date  . AXILLARY HIDRADENITIS EXCISION    . AXILLARY LYMPH NODE BIOPSY Left 01/06/2019  . CYSTOSCOPY W/ URETERAL STENT PLACEMENT Right 05/28/2017   Procedure: CYSTOSCOPY WITH RETROGRADE PYELOGRAM/ RIGHT URETERAL STENT PLACEMENT;  Surgeon: Crist Fat, MD;  Location: [facility];  Service: Urology;  Laterality: Right;  . HYDRADENITIS EXCISION Bilateral 04/03/2017   Procedure: EXCISION AND DRAINAGE BILATERAL  HIDRADENITIS AXILLA;  Surgeon: Griselda Miner, MD;  Location: [facility] OR;  Service: General;  Laterality: Bilateral;  . INCISION AND DRAINAGE ABSCESS Right 07/20/2016   Procedure: INCISION AND DRAINAGE ABSCESS right axilla;  Surgeon: Abigail Miyamoto, MD;  Location: [facility] OR;  Service: General;  Laterality: Right;  . INTRAUTERINE DEVICE (IUD) INSERTION  11/2016   "had the one in my left arm removed"  . IRRIGATION AND DEBRIDEMENT ABSCESS Right 12/21/2016   Procedure: IRRIGATION AND DEBRIDEMENT RIGHT AXILLARY HIDRADENITIS;  Surgeon: Andria Meuse, MD;  Location: [facility] OR;  Service: General;   Laterality: Right;  . IRRIGATION AND DEBRIDEMENT ABSCESS Left 01/08/2017   Procedure: IRRIGATION AND DEBRIDEMENT AXILLARY ABSCESS;  Surgeon: Griselda Miner, MD;  Location: [facility] OR;  Service: General;  Laterality: Left;  . NASAL SEPTOPLASTY W/ TURBINOPLASTY Bilateral 02/25/2019   Procedure: NASAL SEPTOPLASTY WITH TURBINATE REDUCTION;  Surgeon: Serena Colonel, MD;  Location: [facility];  Service: ENT;  Laterality: Bilateral;  . TONSILLECTOMY Bilateral 09/14/2013   Procedure: BILATERAL TONSILLECTOMY;  Surgeon: Serena Colonel, MD;  Location:  SURGERY CENTER;  Service: ENT;  Laterality: Bilateral;  . TYMPANOPLASTY Bilateral    "rebuilt eardrums"  . WISDOM TOOTH EXTRACTION      Family Psychiatric History: I have reviewed family psychiatric history from my progress note on 08/04/2019.  Family History:  Family History  Problem Relation Age of Onset  . Hypertension Mother   . Hyperlipidemia Mother   . Diabetes Mother   . Heart attack Father 75  . Parkinson's disease Maternal Grandmother   . Dementia Maternal Grandmother   . Diabetes Maternal Grandmother   . Bell's palsy Maternal Grandmother   . Alcohol abuse Paternal Uncle     Social History: I have reviewed social history from my progress note on 08/04/2019. Social History   Socioeconomic History  . Marital status: Single    Spouse name: Not on file  . Number of children: Not on file  . Years of education: EMS/Fire training  . Highest education level: Not on file  Occupational History  . Not on file  Tobacco Use  . Smoking status: Never Smoker  . Smokeless tobacco: Never Used  Vaping Use  . Vaping Use: Former  Substance and Sexual Activity  . Alcohol use: Yes    Comment: occasional-about 2 times a month, 1 drink  . Drug use: Not Currently    Comment: in high school  . Sexual activity: Yes    Birth control/protection: Pill, Condom  Other Topics Concern  . Not on file  Social History Narrative   02/11/19    From: the area   Living: with Mom currently, working to find her own place   Work: EMS/Fire      Family: good relationship with Mom and Grandma      Enjoys: spend time with friends/family, hunting, bomb fire      Exercise: every shift does training - 2-3 times a week   Diet: well rounded, meat heavy      Safety   Seat belts: Yes    Guns: Yes  and secure   Safe in relationships: Yes    Helmet: no    Social Determinants of Health   Financial Resource Strain: Low Risk   . Difficulty of Paying Living Expenses: Not hard at all  Food Insecurity:   . Worried About Programme researcher, broadcasting/film/video in the Last Year: Not on file  . Ran Out of Food in the Last Year: Not on file  Transportation Needs:   . Lack of Transportation (Medical): Not on file  . Lack of Transportation (Non-Medical): Not on file  Physical Activity:   . Days of Exercise per Week: Not  on file  . Minutes of Exercise per Session: Not on file  Stress:   . Feeling of Stress : Not on file  Social Connections:   . Frequency of Communication with Friends and Family: Not on file  . Frequency of Social Gatherings with Friends and Family: Not on file  . Attends Religious Services: Not on file  . Active Member of Clubs or Organizations: Not on file  . Attends Banker Meetings: Not on file  . Marital Status: Not on file    Allergies:  Allergies  Allergen Reactions  . Other Hives    ABD pad causes rash Pt can only tolerate cloth tape Pt can only tolerate cloth tape  . Shellfish Allergy Anaphylaxis  . Tapentadol Hives and Other (See Comments)    Pt can only tolerate cloth tape  . Tape Hives and Other (See Comments)    Pt can only tolerate cloth tape  . Amoxicillin-Pot Clavulanate Other (See Comments)  . Morphine And Related     " I become very angry"  . Iodine Nausea Only  . Vancomycin Other (See Comments)    Burns her vein really bad and always causes them to blow.    Metabolic Disorder Labs: Lab Results   Component Value Date   HGBA1C 5.1 05/18/2019   No results found for: PROLACTIN Lab Results  Component Value Date   CHOL 207 (H) 05/18/2019   TRIG 191.0 (H) 05/18/2019   HDL 49.80 05/18/2019   CHOLHDL 4 05/18/2019   VLDL 38.2 05/18/2019   LDLCALC 119 (H) 05/18/2019   LDLCALC 116 (H) 12/03/2016   Lab Results  Component Value Date   TSH 1.32 08/05/2019   TSH 2.45 12/03/2016    Therapeutic Level Labs: No results found for: LITHIUM No results found for: VALPROATE No components found for:  CBMZ  Current Medications: Current Outpatient Medications  Medication Sig Dispense Refill  . oxyCODONE (OXY IR/ROXICODONE) 5 MG immediate release tablet Take 1-2 tablets as needed every 6 hours for pain    . albuterol (VENTOLIN HFA) 108 (90 Base) MCG/ACT inhaler TAKE 2 PUFFS BY MOUTH EVERY 6 HOURS AS NEEDED 18 g 5  . clindamycin (CLEOCIN) 300 MG capsule Take 300 mg by mouth 2 (two) times daily.    . diazepam (VALIUM) 10 MG tablet Take 1 tablet 30 minutes prior to dermatology procedure    . diphenhydrAMINE (BENADRYL) 25 mg capsule Take 25 mg by mouth every 6 (six) hours as needed for itching or allergies.     . drospirenone-ethinyl estradiol ([name]) 3-0.02 MG tablet [name] (28) 3 mg-0.02 mg tablet  TAKE 1 TABLET BY MOUTH EVERY DAY    . ibuprofen (ADVIL) 800 MG tablet Take 1 tablet (800 mg total) by mouth 3 (three) times daily. 21 tablet 0  . inFLIXimab (REMICADE IV) Inject into the vein.    Marland Kitchen lamoTRIgine (LAMICTAL) 25 MG tablet Take 3 tablets (75 mg total) by mouth as directed. Take 1 tablet in the AM and 2 tablets daily PM 90 tablet 1  . methylphenidate (RITALIN) 10 MG tablet Take 1 tablet (10 mg total) by mouth daily as needed (afternoon ADHD symptoms). 30 tablet 0  . methylphenidate (RITALIN) 20 MG tablet Take 1 tablet (20 mg total) by mouth daily with breakfast. 30 tablet 0  . methylphenidate (RITALIN) 20 MG tablet Take 1 tablet (20 mg total) by mouth daily with breakfast. 30 tablet 0  .  methylphenidate (RITALIN) 20 MG tablet Take 1 tablet (20 mg  total) by mouth daily with breakfast. 30 tablet 0  . montelukast (SINGULAIR) 10 MG tablet Take 1 tablet (10 mg total) by mouth at bedtime. 90 tablet 3  . ondansetron (ZOFRAN ODT) 4 MG disintegrating tablet Take 1 tablet (4 mg total) by mouth every 8 (eight) hours as needed for nausea or vomiting. (Patient taking differently: Take 4 mg by mouth every 8 (eight) hours as needed for nausea or vomiting. ) 20 tablet 0  . oxyCODONE (OXY IR/ROXICODONE) 5 MG immediate release tablet SMARTSIG:12 Tablet(s) By Mouth Every 6 Hours PRN    . spironolactone (ALDACTONE) 100 MG tablet Take 1 tablet by mouth daily.     No current facility-administered medications for this visit.     Musculoskeletal: Strength & Muscle Tone: UTA Gait & Station: normal Patient leans: N/A  Psychiatric Specialty Exam: Review of Systems  Psychiatric/Behavioral: Negative for agitation, behavioral problems, confusion, decreased concentration, dysphoric mood, hallucinations, self-injury, sleep disturbance and suicidal ideas. The patient is not nervous/anxious and is not hyperactive.        Mood swings - irritable  All other systems reviewed and are negative.   There were no vitals taken for this visit.There is no height or weight on file to calculate BMI.  General Appearance: Casual  Eye Contact:  Fair  Speech:  Clear and Coherent  Volume:  Normal  Mood:  Irritable  Affect:  Congruent  Thought Process:  Goal Directed and Descriptions of Associations: Intact  Orientation:  Full (Time, Place, and Person)  Thought Content: Logical   Suicidal Thoughts:  No  Homicidal Thoughts:  No  Memory:  Immediate;   Fair Recent;   Fair Remote;   Fair  Judgement:  Fair  Insight:  Fair  Psychomotor Activity:  Normal  Concentration:  Concentration: Fair and Attention Span: Fair  Recall:  FiservFair  Fund of Knowledge: Fair  Language: Fair  Akathisia:  No  Handed:  Right  AIMS (if  indicated):UTA  Assets:  Communication Skills Desire for Improvement Housing Social Support  ADL's:  Intact  Cognition: WNL  Sleep:  Fair   Screenings: GAD-7     Office Visit from 01/20/2019 in [facility]  Total GAD-7 Score 3    PHQ2-9     Office Visit from 01/20/2019 in [facility] Office Visit from 04/23/2017 in [facility]  PHQ-2 Total Score 2 0  PHQ-9 Total Score 6 --       Assessment and Plan: Courtney ApleyBrandy L [name] is a 29 year old Caucasian female, works as a Engineer, watervolunteer firefighter, lives in [place], has a history of ADHD, mood swings, migraine headaches was evaluated by telemedicine today.  Patient is currently struggling with mood lability, irritability although improving.  Plan as noted below.  Plan Bipolar disorder type II-some progress Increase lamotrigine to 75 mg p.o. daily in divided dosage. Continue melatonin as needed  ADHD-stable Ritalin 20 mg p.o. daily in the morning and 10 mg in the afternoon She is currently prescribed Ritalin by her primary care provider. I have reviewed [place] controlled substance database.  Follow-up in clinic in 3 to 4 weeks or sooner if needed.  I have spent atleast 20 minutes face to face with patient today. More than 50 % of the time was spent for preparing to see the patient ( e.g., review of test, records ), ordering medications and test ,psychoeducation and supportive psychotherapy and care coordination,as well as documenting clinical information in electronic health record. This note was generated in  part or whole with voice recognition software. Voice recognition is usually quite accurate but there are transcription errors that can and very often do occur. I apologize for any typographical errors that were not detected and corrected.          Jomarie Longs, MD 11/12/2019, 8:11 AM

## 2019-11-16 ENCOUNTER — Encounter: Admit: 2019-11-16 | Discharge: 2019-11-17 | Payer: PRIVATE HEALTH INSURANCE

## 2019-11-16 DIAGNOSIS — L732 Hidradenitis suppurativa: Principal | ICD-10-CM

## 2019-11-17 ENCOUNTER — Ambulatory Visit (INDEPENDENT_AMBULATORY_CARE_PROVIDER_SITE_OTHER): Payer: BC Managed Care – PPO | Admitting: Family Medicine

## 2019-11-17 ENCOUNTER — Other Ambulatory Visit: Payer: Self-pay

## 2019-11-17 VITALS — BP 110/62 | HR 92 | Temp 97.8°F | Ht 61.0 in | Wt 203.5 lb

## 2019-11-17 DIAGNOSIS — E559 Vitamin D deficiency, unspecified: Secondary | ICD-10-CM

## 2019-11-17 DIAGNOSIS — R5383 Other fatigue: Secondary | ICD-10-CM

## 2019-11-17 DIAGNOSIS — R7989 Other specified abnormal findings of blood chemistry: Secondary | ICD-10-CM | POA: Diagnosis not present

## 2019-11-17 DIAGNOSIS — F901 Attention-deficit hyperactivity disorder, predominantly hyperactive type: Secondary | ICD-10-CM | POA: Diagnosis not present

## 2019-11-17 DIAGNOSIS — R110 Nausea: Secondary | ICD-10-CM | POA: Diagnosis not present

## 2019-11-17 DIAGNOSIS — Z23 Encounter for immunization: Secondary | ICD-10-CM | POA: Diagnosis not present

## 2019-11-17 LAB — CBC
HCT: 37.3 % (ref 36.0–46.0)
Hemoglobin: 12.5 g/dL (ref 12.0–15.0)
MCHC: 33.6 g/dL (ref 30.0–36.0)
MCV: 86.8 fl (ref 78.0–100.0)
Platelets: 322 10*3/uL (ref 150.0–400.0)
RBC: 4.3 Mil/uL (ref 3.87–5.11)
RDW: 14.1 % (ref 11.5–15.5)
WBC: 9.2 10*3/uL (ref 4.0–10.5)

## 2019-11-17 LAB — VITAMIN D 25 HYDROXY (VIT D DEFICIENCY, FRACTURES): VITD: 25.34 ng/mL — ABNORMAL LOW (ref 30.00–100.00)

## 2019-11-17 LAB — FERRITIN: Ferritin: 51.8 ng/mL (ref 10.0–291.0)

## 2019-11-17 LAB — VITAMIN B12: Vitamin B-12: 247 pg/mL (ref 211–911)

## 2019-11-17 MED ORDER — ONDANSETRON 4 MG PO TBDP: 4.0000 mg | ORAL_TABLET | Freq: Three times a day (TID) | ORAL | 0 refills | Status: DC | PRN

## 2019-11-17 MED ORDER — METHYLPHENIDATE HCL 20 MG PO TABS: 20.0000 mg | ORAL_TABLET | Freq: Every day | ORAL | 0 refills | Status: DC

## 2019-11-17 NOTE — Assessment & Plan Note (Signed)
Pt with new worsening fatigue. Recheck levels.

## 2019-11-17 NOTE — Assessment & Plan Note (Signed)
Doing well. Continues to only need occasionally afternoon dose. Cont 20 mg AM and 10 mg PM prn dosing. Return 3 months. Contract today

## 2019-11-17 NOTE — Patient Instructions (Signed)
Good to see you!  Labs via MyChart in 1-2 days  Return in 3 months

## 2019-11-17 NOTE — Progress Notes (Signed)
Subjective:     Courtney [name] is a 29 y.o. female presenting for Follow-up and Medication Refill (ritalin )     HPI  #ADHD - doing well on current dose - no issues with appetite -  Some insomnia but responds to melatonin   Feeling tired often Getting good sleep   Nausea occasionally and 2/2 to remicade   Review of Systems   Social History   Tobacco Use  Smoking Status Never Smoker  Smokeless Tobacco Never Used        Objective:    BP Readings from Last 3 Encounters:  11/17/19 110/62  08/12/19 125/80  08/09/19 104/73   Wt Readings from Last 3 Encounters:  11/17/19 203 lb 8 oz (92.3 kg)  08/12/19 180 lb (81.6 kg)  08/05/19 201 lb (91.2 kg)    BP 110/62   Pulse 92   Temp 97.8 F (36.6 C) (Temporal)   Ht 5\' 1"  (1.549 m)   Wt 203 lb 8 oz (92.3 kg)   SpO2 98%   BMI 38.45 kg/m    Physical Exam Constitutional:      General: She is not in acute distress.    Appearance: She is well-developed. She is not diaphoretic.  HENT:     Right Ear: External ear normal.     Left Ear: External ear normal.     Nose: Nose normal.  Eyes:     Conjunctiva/sclera: Conjunctivae normal.  Cardiovascular:     Rate and Rhythm: Normal rate and regular rhythm.     Heart sounds: No murmur heard.   Pulmonary:     Effort: Pulmonary effort is normal. No respiratory distress.     Breath sounds: Normal breath sounds. No wheezing.  Musculoskeletal:     Cervical back: Neck supple.  Skin:    General: Skin is warm and dry.     Capillary Refill: Capillary refill takes less than 2 seconds.  Neurological:     Mental Status: She is alert. Mental status is at baseline.  Psychiatric:        Mood and Affect: Mood normal.        Behavior: Behavior normal.           Assessment & Plan:   Problem List Items Addressed This Visit      Other   Attention deficit hyperactivity disorder (ADHD), predominantly hyperactive type - Primary    Doing well. Continues to only need  occasionally afternoon dose. Cont 20 mg AM and 10 mg PM prn dosing. Return 3 months. Contract today      Relevant Medications   methylphenidate (RITALIN) 20 MG tablet   Other fatigue    Getting good sleep but with fatigue. Will check labs - previous CBC was normal but pt concerned about Vit B12, will also get ferritin. Recent thyroid was normal.       Relevant Orders   CBC   Ferritin   Vitamin B12   Vitamin D deficiency    Pt with new worsening fatigue. Recheck levels.       Nausea    Pt gets nausea with remicade infusion and then occasionally. Refill zofran for prn use.       Relevant Medications   ondansetron (ZOFRAN ODT) 4 MG disintegrating tablet    Other Visit Diagnoses    Need for influenza vaccination       Relevant Orders   Flu Vaccine QUAD 36+ mos IM   Low vitamin D level  Relevant Orders   Vitamin D, 25-hydroxy       Return in about 3 months (around 02/16/2020).  Lynnda Child, MD  This visit occurred during the SARS-CoV-2 public health emergency.  Safety protocols were in place, including screening questions prior to the visit, additional usage of staff PPE, and extensive cleaning of exam room while observing appropriate contact time as indicated for disinfecting solutions.

## 2019-11-17 NOTE — Assessment & Plan Note (Signed)
Getting good sleep but with fatigue. Will check labs - previous CBC was normal but pt concerned about Vit B12, will also get ferritin. Recent thyroid was normal.

## 2019-11-17 NOTE — Assessment & Plan Note (Signed)
Pt gets nausea with remicade infusion and then occasionally. Refill zofran for prn use.

## 2019-11-18 ENCOUNTER — Other Ambulatory Visit: Payer: Self-pay | Admitting: Family Medicine

## 2019-11-18 DIAGNOSIS — E559 Vitamin D deficiency, unspecified: Secondary | ICD-10-CM

## 2019-11-18 MED ORDER — VITAMIN D (ERGOCALCIFEROL) 1.25 MG (50000 UNIT) PO CAPS: 50000.0000 [IU] | ORAL_CAPSULE | ORAL | 1 refills | Status: DC

## 2019-11-23 ENCOUNTER — Other Ambulatory Visit: Payer: Self-pay | Admitting: Family Medicine

## 2019-11-23 DIAGNOSIS — J4520 Mild intermittent asthma, uncomplicated: Secondary | ICD-10-CM

## 2019-11-24 ENCOUNTER — Other Ambulatory Visit: Payer: BC Managed Care – PPO

## 2019-11-24 ENCOUNTER — Other Ambulatory Visit: Payer: Self-pay

## 2019-11-24 ENCOUNTER — Other Ambulatory Visit: Payer: Self-pay | Admitting: Critical Care Medicine

## 2019-11-24 DIAGNOSIS — Z20822 Contact with and (suspected) exposure to covid-19: Secondary | ICD-10-CM

## 2019-11-26 LAB — SPECIMEN STATUS REPORT

## 2019-11-26 LAB — SARS-COV-2, NAA 2 DAY TAT

## 2019-11-26 LAB — NOVEL CORONAVIRUS, NAA: SARS-CoV-2, NAA: NOT DETECTED

## 2019-11-27 ENCOUNTER — Encounter: Payer: Self-pay | Admitting: Family Medicine

## 2019-11-27 ENCOUNTER — Other Ambulatory Visit: Payer: Self-pay

## 2019-11-27 ENCOUNTER — Telehealth (INDEPENDENT_AMBULATORY_CARE_PROVIDER_SITE_OTHER): Payer: BC Managed Care – PPO | Admitting: Family Medicine

## 2019-11-27 DIAGNOSIS — R05 Cough: Secondary | ICD-10-CM

## 2019-11-27 DIAGNOSIS — R059 Cough, unspecified: Secondary | ICD-10-CM | POA: Insufficient documentation

## 2019-11-27 MED ORDER — AZITHROMYCIN 250 MG PO TABS: ORAL_TABLET | ORAL | 0 refills | Status: DC

## 2019-11-27 MED ORDER — PREDNISONE 20 MG PO TABS: ORAL_TABLET | ORAL | 0 refills | Status: DC

## 2019-11-27 MED ORDER — GUAIFENESIN-CODEINE 100-10 MG/5ML PO SYRP: 5.0000 mL | ORAL_SOLUTION | Freq: Every evening | ORAL | 0 refills | Status: DC | PRN

## 2019-11-27 NOTE — Progress Notes (Signed)
VIRTUAL VISIT Due to national recommendations of social distancing due to COVID 19, a virtual visit is felt to be most appropriate for this patient at this time.   I connected with the patient on 11/27/19 at 11:00 AM EDT by virtual telehealth platform and verified that I am speaking with the correct person using two identifiers.   I discussed the limitations, risks, security and privacy concerns of performing an evaluation and management service by  virtual telehealth platform and the availability of in person appointments. I also discussed with the patient that there may be a patient responsible charge related to this service. The patient expressed understanding and agreed to proceed.  Patient location: Home Provider Location: [place] [facility] Participants: Kerby Nora and [name]   Chief Complaint  Patient presents with  . chest congestion    x7 days  . Cough  . Nasal Congestion    History of Present Illness: Cough This is a new problem. The current episode started 1 to 4 weeks ago ( 6-7 days of illness). The problem has been gradually worsening. The cough is productive of sputum. Associated symptoms include headaches, nasal congestion, rhinorrhea, a sore throat and shortness of breath. Pertinent negatives include no chills, ear congestion, ear pain, fever, myalgias, rash or wheezing. Associated symptoms comments: Sinus pressure, no n/V Mild shortness of breath thorough nose.  no loss of taste or smell. The symptoms are aggravated by lying down (keeping her up at night). Risk factors: non smoker. Treatments tried: mucinex as needed. The treatment provided mild relief. There is no history of asthma, COPD, environmental allergies or pneumonia.    No asthma or CPD, nonsmoker  She has allergies and is on Singulair. She is using Nasal saline.   Had COVID test on 9/21 PCR test negative.  Hx of sinus infection.. but improved after septum repair.  COVID 19 screen No recent  travel or known exposure to COVID19  Works as Public librarian, always wearing mask. She  Is s/p COVID vaccine x 3 doses  The importance of social distancing was discussed today.   Review of Systems  Constitutional: Negative for chills and fever.  HENT: Positive for rhinorrhea and sore throat. Negative for ear pain.   Respiratory: Positive for cough and shortness of breath. Negative for wheezing.   Musculoskeletal: Negative for myalgias.  Skin: Negative for rash.  Neurological: Positive for headaches.  Endo/Heme/Allergies: Negative for environmental allergies.      Past Medical History:  Diagnosis Date  . ADHD (attention deficit hyperactivity disorder)   . Chronic tonsillitis 08/2013   current strep, will finish antibiotic 09/04/2013; snores during sleep, mother denies apnea  . Family history of adverse reaction to anesthesia    " My cousin had problems waking up. "  . Heart murmur    "when I was born; outgrew it; I was a preemie"  . Hidradenitis   . Hidradenitis 2017  . Migraine 12/19/2016   "I've just had this one for 3 days" (12/20/2016)    reports that she has never smoked. She has never used smokeless tobacco. She reports current alcohol use. She reports previous drug use.   Current Outpatient Medications:  .  albuterol (VENTOLIN HFA) 108 (90 Base) MCG/ACT inhaler, INHALE 2 PUFFS BY MOUTH EVERY 6 HOURS AS NEEDED, Disp: 8.5 each, Rfl: 5 .  clindamycin (CLEOCIN) 300 MG capsule, Take 300 mg by mouth 2 (two) times daily., Disp: , Rfl:  .  diphenhydrAMINE (BENADRYL) 25 mg capsule,  Take 25 mg by mouth every 6 (six) hours as needed for itching or allergies. , Disp: , Rfl:  .  drospirenone-ethinyl estradiol ([name]) 3-0.02 MG tablet, [name] (28) 3 mg-0.02 mg tablet  TAKE 1 TABLET BY MOUTH EVERY DAY, Disp: , Rfl:  .  ibuprofen (ADVIL) 800 MG tablet, Take 1 tablet (800 mg total) by mouth 3 (three) times daily., Disp: 21 tablet, Rfl: 0 .  inFLIXimab (REMICADE IV), Inject into the vein.,  Disp: , Rfl:  .  lamoTRIgine (LAMICTAL) 25 MG tablet, Take 3 tablets (75 mg total) by mouth as directed. Take 1 tablet in the AM and 2 tablets daily PM, Disp: 90 tablet, Rfl: 1 .  methylphenidate (RITALIN) 10 MG tablet, Take 1 tablet (10 mg total) by mouth daily as needed (afternoon ADHD symptoms)., Disp: 30 tablet, Rfl: 0 .  methylphenidate (RITALIN) 20 MG tablet, Take 1 tablet (20 mg total) by mouth daily with breakfast., Disp: 30 tablet, Rfl: 0 .  [START ON 01/17/2020] methylphenidate (RITALIN) 20 MG tablet, Take 1 tablet (20 mg total) by mouth daily with breakfast., Disp: 30 tablet, Rfl: 0 .  [START ON 12/17/2019] methylphenidate (RITALIN) 20 MG tablet, Take 1 tablet (20 mg total) by mouth daily with breakfast., Disp: 30 tablet, Rfl: 0 .  montelukast (SINGULAIR) 10 MG tablet, Take 1 tablet (10 mg total) by mouth at bedtime., Disp: 90 tablet, Rfl: 3 .  ondansetron (ZOFRAN ODT) 4 MG disintegrating tablet, Take 1 tablet (4 mg total) by mouth every 8 (eight) hours as needed for nausea or vomiting., Disp: 20 tablet, Rfl: 0 .  Vitamin D, Ergocalciferol, (DRISDOL) 1.25 MG (50000 UNIT) CAPS capsule, Take 1 capsule (50,000 Units total) by mouth every 7 (seven) days., Disp: 4 capsule, Rfl: 1 .  spironolactone (ALDACTONE) 100 MG tablet, Take 1 tablet by mouth daily. (Patient not taking: Reported on 11/27/2019), Disp: , Rfl:    Observations/Objective: There were no vitals taken for this visit.  Physical Exam  Physical Exam Constitutional:      General: The patient is not in acute distress. Congested voice. Pulmonary:     Effort: Pulmonary effort is normal. No respiratory distress.  Neurological:     Mental Status: The patient is alert and oriented to person, place, and time.  Psychiatric:        Mood and Affect: Mood normal.        Behavior: Behavior normal.   Assessment and Plan Cough Neg COVID PCR test and no known exposure.  Cleared to return to work as no fever.  Liely viral URI vs  allergies... treat with prednisone taper, antihistamines, nasal saline irrigation and cough med for sleep at night.  If not improving  As expected by day 10-14 of illness.. fill prescription for antibiotics.     I discussed the assessment and treatment plan with the patient. The patient was provided an opportunity to ask questions and all were answered. The patient agreed with the plan and demonstrated an understanding of the instructions.   The patient was advised to call back or seek an in-person evaluation if the symptoms worsen or if the condition fails to improve as anticipated.     Kerby Nora, MD

## 2019-11-27 NOTE — Patient Instructions (Signed)
Neg COVID PCR test and no known exposure.  Cleared to return to work as no fever.  Liely viral URI vs allergies... treat with prednisone taper, antihistamines, nasal saline irrigation and cough med for sleep at night.  If not improving  As expected by day 10-14 of illness.. fill prescription for antibiotics. 

## 2019-11-27 NOTE — Assessment & Plan Note (Signed)
Neg COVID PCR test and no known exposure.  Cleared to return to work as no fever.  Liely viral URI vs allergies... treat with prednisone taper, antihistamines, nasal saline irrigation and cough med for sleep at night.  If not improving  As expected by day 10-14 of illness.. fill prescription for antibiotics.

## 2019-12-07 ENCOUNTER — Other Ambulatory Visit: Payer: Self-pay

## 2019-12-07 ENCOUNTER — Ambulatory Visit
Admission: RE | Admit: 2019-12-07 | Discharge: 2019-12-07 | Disposition: A | Discharge status: Home / Self Care | Payer: BC Managed Care – PPO | Source: Ambulatory Visit | Attending: Emergency Medicine | Admitting: Emergency Medicine

## 2019-12-07 VITALS — BP 106/65 | HR 105 | Temp 98.1°F | Resp 20

## 2019-12-07 DIAGNOSIS — J0101 Acute recurrent maxillary sinusitis: Secondary | ICD-10-CM | POA: Diagnosis not present

## 2019-12-07 MED ORDER — FLUTICASONE PROPIONATE 50 MCG/ACT NA SUSP: 1.0000 | Freq: Every day | NASAL | 0 refills | Status: DC

## 2019-12-07 MED ORDER — DEXAMETHASONE 4 MG PO TABS: 4.0000 mg | ORAL_TABLET | Freq: Every day | ORAL | 0 refills | Status: AC

## 2019-12-07 MED ORDER — DOXYCYCLINE HYCLATE 100 MG PO CAPS: 100.0000 mg | ORAL_CAPSULE | Freq: Two times a day (BID) | ORAL | 0 refills | Status: DC

## 2019-12-07 NOTE — Discharge Instructions (Addendum)
Rest and push fluids Decadron prescribed Doxycycline prescribed.  Take as directed and to completion Continue with OTC ibuprofen/tylenol as needed for pain Follow up with PCP or [facility] if symptoms persists Return or go to the ED if you have any new or worsening symptoms such as fever, chills, worsening sinus pain/pressure, cough, sore throat, chest pain, shortness of breath, abdominal pain, changes in bowel or bladder habits, etc..Marland KitchenMarland Kitchen

## 2019-12-07 NOTE — ED Provider Notes (Signed)
[facility]   [id number] 12/07/19 Arrival Time: 0630   Chief Complaint  Patient presents with  . Facial Pain     SUBJECTIVE: History from: patient.  Courtney [name] is a 29 y.o. female who presented to the urgent care with a complaint of continued nasal congestion and right facial pain and jaw pain for more than 4 weeks.  Denies sick exposure to COVID, flu or strep.  Denies recent travel.  Has tried azithromycin with mild relief.  Denies aggravating factors.  Denies previous symptoms in the past.   Denies fever, chills, fatigue, sinus pain, rhinorrhea, sore throat, SOB, wheezing, chest pain, nausea, changes in bowel or bladder habits.    ROS: As per HPI.  All other pertinent ROS negative.     Past Medical History:  Diagnosis Date  . ADHD (attention deficit hyperactivity disorder)   . Chronic tonsillitis 08/2013   current strep, will finish antibiotic 09/04/2013; snores during sleep, mother denies apnea  . Family history of adverse reaction to anesthesia    " My cousin had problems waking up. "  . Heart murmur    "when I was born; outgrew it; I was a preemie"  . Hidradenitis   . Hidradenitis 2017  . Migraine 12/19/2016   "I've just had this one for 3 days" (12/20/2016)   Past Surgical History:  Procedure Laterality Date  . AXILLARY HIDRADENITIS EXCISION    . AXILLARY LYMPH NODE BIOPSY Left 01/06/2019  . CYSTOSCOPY W/ URETERAL STENT PLACEMENT Right 05/28/2017   Procedure: CYSTOSCOPY WITH RETROGRADE PYELOGRAM/ RIGHT URETERAL STENT PLACEMENT;  Surgeon: Crist Fat, MD;  Location: [facility];  Service: Urology;  Laterality: Right;  . HYDRADENITIS EXCISION Bilateral 04/03/2017   Procedure: EXCISION AND DRAINAGE BILATERAL  HIDRADENITIS AXILLA;  Surgeon: Griselda Miner, MD;  Location: [facility] OR;  Service: General;  Laterality: Bilateral;  . INCISION AND DRAINAGE ABSCESS Right 07/20/2016   Procedure: INCISION AND DRAINAGE ABSCESS right axilla;  Surgeon: Abigail Miyamoto, MD;   Location: [facility] OR;  Service: General;  Laterality: Right;  . INTRAUTERINE DEVICE (IUD) INSERTION  11/2016   "had the one in my left arm removed"  . IRRIGATION AND DEBRIDEMENT ABSCESS Right 12/21/2016   Procedure: IRRIGATION AND DEBRIDEMENT RIGHT AXILLARY HIDRADENITIS;  Surgeon: Andria Meuse, MD;  Location: [facility] OR;  Service: General;  Laterality: Right;  . IRRIGATION AND DEBRIDEMENT ABSCESS Left 01/08/2017   Procedure: IRRIGATION AND DEBRIDEMENT AXILLARY ABSCESS;  Surgeon: Griselda Miner, MD;  Location: [facility];  Service: General;  Laterality: Left;  . NASAL SEPTOPLASTY W/ TURBINOPLASTY Bilateral 02/25/2019   Procedure: NASAL SEPTOPLASTY WITH TURBINATE REDUCTION;  Surgeon: Serena Colonel, MD;  Location: [facility];  Service: ENT;  Laterality: Bilateral;  . TONSILLECTOMY Bilateral 09/14/2013   Procedure: BILATERAL TONSILLECTOMY;  Surgeon: Serena Colonel, MD;  Location: [facility];  Service: ENT;  Laterality: Bilateral;  . TYMPANOPLASTY Bilateral    "rebuilt eardrums"  . WISDOM TOOTH EXTRACTION     Allergies  Allergen Reactions  . Other Hives    ABD pad causes rash Pt can only tolerate cloth tape Pt can only tolerate cloth tape  . Shellfish Allergy Anaphylaxis  . Tapentadol Hives and Other (See Comments)    Pt can only tolerate cloth tape  . Tape Hives and Other (See Comments)    Pt can only tolerate cloth tape  . Amoxicillin-Pot Clavulanate Other (See Comments)  . Morphine And Related     " I become very angry"  .  Iodine Nausea Only  . Vancomycin Other (See Comments)    Burns her vein really bad and always causes them to blow.   No current facility-administered medications on file prior to encounter.   Current Outpatient Medications on File Prior to Encounter  Medication Sig Dispense Refill  . albuterol (VENTOLIN HFA) 108 (90 Base) MCG/ACT inhaler INHALE 2 PUFFS BY MOUTH EVERY 6 HOURS AS NEEDED 8.5 each 5  . azithromycin (ZITHROMAX) 250 MG tablet 2 tab po  x 1 day then 1 tab po daily Fill if not improving in 3-4 days, VOID after 12/08/2019 6 tablet 0  . clindamycin (CLEOCIN) 300 MG capsule Take 300 mg by mouth 2 (two) times daily.    . diphenhydrAMINE (BENADRYL) 25 mg capsule Take 25 mg by mouth every 6 (six) hours as needed for itching or allergies.     . drospirenone-ethinyl estradiol ([name]) 3-0.02 MG tablet [name] (28) 3 mg-0.02 mg tablet  TAKE 1 TABLET BY MOUTH EVERY DAY    . guaiFENesin-codeine (ROBITUSSIN AC) 100-10 MG/5ML syrup Take 5-10 mLs by mouth at bedtime as needed for cough. 180 mL 0  . ibuprofen (ADVIL) 800 MG tablet Take 1 tablet (800 mg total) by mouth 3 (three) times daily. 21 tablet 0  . inFLIXimab (REMICADE IV) Inject into the vein.    Marland Kitchen lamoTRIgine (LAMICTAL) 25 MG tablet Take 3 tablets (75 mg total) by mouth as directed. Take 1 tablet in the AM and 2 tablets daily PM 90 tablet 1  . methylphenidate (RITALIN) 10 MG tablet Take 1 tablet (10 mg total) by mouth daily as needed (afternoon ADHD symptoms). 30 tablet 0  . methylphenidate (RITALIN) 20 MG tablet Take 1 tablet (20 mg total) by mouth daily with breakfast. 30 tablet 0  . [START ON 01/17/2020] methylphenidate (RITALIN) 20 MG tablet Take 1 tablet (20 mg total) by mouth daily with breakfast. 30 tablet 0  . [START ON 12/17/2019] methylphenidate (RITALIN) 20 MG tablet Take 1 tablet (20 mg total) by mouth daily with breakfast. 30 tablet 0  . montelukast (SINGULAIR) 10 MG tablet Take 1 tablet (10 mg total) by mouth at bedtime. 90 tablet 3  . ondansetron (ZOFRAN ODT) 4 MG disintegrating tablet Take 1 tablet (4 mg total) by mouth every 8 (eight) hours as needed for nausea or vomiting. 20 tablet 0  . predniSONE (DELTASONE) 20 MG tablet 3 tabs by mouth daily x 3 days, then 2 tabs by mouth daily x 2 days then 1 tab by mouth daily x 2 days 15 tablet 0  . spironolactone (ALDACTONE) 100 MG tablet Take 1 tablet by mouth daily. (Patient not taking: Reported on 11/27/2019)    . Vitamin D,  Ergocalciferol, (DRISDOL) 1.25 MG (50000 UNIT) CAPS capsule Take 1 capsule (50,000 Units total) by mouth every 7 (seven) days. 4 capsule 1  . [DISCONTINUED] mometasone (NASONEX) 50 MCG/ACT nasal spray Place 2 sprays into the nose daily. (Patient not taking: Reported on 05/13/2018) 17 g 0   Social History   Socioeconomic History  . Marital status: Single    Spouse name: Not on file  . Number of children: Not on file  . Years of education: EMS/Fire training  . Highest education level: Not on file  Occupational History  . Not on file  Tobacco Use  . Smoking status: Never Smoker  . Smokeless tobacco: Never Used  Vaping Use  . Vaping Use: Former  Substance and Sexual Activity  . Alcohol use: Yes    Comment: occasional-about  2 times a month, 1 drink  . Drug use: Not Currently    Comment: in high school  . Sexual activity: Yes    Birth control/protection: Pill, Condom  Other Topics Concern  . Not on file  Social History Narrative   02/11/19   From: the area   Living: with Mom currently, working to find her own place   Work: EMS/Fire      Family: good relationship with Mom and Grandma      Enjoys: spend time with friends/family, hunting, bomb fire      Exercise: every shift does training - 2-3 times a week   Diet: well rounded, meat heavy      Safety   Seat belts: Yes    Guns: Yes  and secure   Safe in relationships: Yes    Helmet: no    Social Determinants of Health   Financial Resource Strain: Low Risk   . Difficulty of Paying Living Expenses: Not hard at all  Food Insecurity:   . Worried About Programme researcher, broadcasting/film/video in the Last Year: Not on file  . Ran Out of Food in the Last Year: Not on file  Transportation Needs:   . Lack of Transportation (Medical): Not on file  . Lack of Transportation (Non-Medical): Not on file  Physical Activity:   . Days of Exercise per Week: Not on file  . Minutes of Exercise per Session: Not on file  Stress:   . Feeling of Stress : Not on  file  Social Connections:   . Frequency of Communication with Friends and Family: Not on file  . Frequency of Social Gatherings with Friends and Family: Not on file  . Attends Religious Services: Not on file  . Active Member of Clubs or Organizations: Not on file  . Attends Banker Meetings: Not on file  . Marital Status: Not on file  Intimate Partner Violence:   . Fear of Current or Ex-Partner: Not on file  . Emotionally Abused: Not on file  . Physically Abused: Not on file  . Sexually Abused: Not on file   Family History  Problem Relation Age of Onset  . Hypertension Mother   . Hyperlipidemia Mother   . Diabetes Mother   . Heart attack Father 55  . Parkinson's disease Maternal Grandmother   . Dementia Maternal Grandmother   . Diabetes Maternal Grandmother   . Bell's palsy Maternal Grandmother   . Alcohol abuse Paternal Uncle     OBJECTIVE:  Vitals:   12/07/19 0843  BP: 106/65  Pulse: (!) 105  Resp: 20  Temp: 98.1 F (36.7 C)  SpO2: 97%     Physical Exam Vitals and nursing note reviewed.  Constitutional:      General: She is not in acute distress.    Appearance: Normal appearance. She is normal weight. She is not ill-appearing, toxic-appearing or diaphoretic.  HENT:     Head: Normocephalic.     Right Ear: Tympanic membrane, ear canal and external ear normal. There is no impacted cerumen.     Left Ear: Tympanic membrane, ear canal and external ear normal. There is no impacted cerumen.     Nose:     Right Sinus: Maxillary sinus tenderness present.     Left Sinus: No maxillary sinus tenderness.     Mouth/Throat:     Lips: Pink.     Mouth: Mucous membranes are moist.     Dentition: Normal dentition. No dental caries or  dental abscesses.  Cardiovascular:     Rate and Rhythm: Normal rate and regular rhythm.     Pulses: Normal pulses.     Heart sounds: Normal heart sounds. No murmur heard.  No friction rub. No gallop.   Pulmonary:     Effort:  Pulmonary effort is normal. No respiratory distress.     Breath sounds: Normal breath sounds. No stridor. No wheezing, rhonchi or rales.  Chest:     Chest wall: No tenderness.  Neurological:     Mental Status: She is alert and oriented to person, place, and time.     LABS:  No results found for this or any previous visit (from the past 24 hour(s)).   ASSESSMENT & PLAN:  1. Acute recurrent maxillary sinusitis     Meds ordered this encounter  Medications  . doxycycline (VIBRAMYCIN) 100 MG capsule    Sig: Take 1 capsule (100 mg total) by mouth 2 (two) times daily.    Dispense:  20 capsule    Refill:  0  . dexamethasone (DECADRON) 4 MG tablet    Sig: Take 1 tablet (4 mg total) by mouth daily for 7 days.    Dispense:  7 tablet    Refill:  0  . fluticasone (FLONASE) 50 MCG/ACT nasal spray    Sig: Place 1 spray into both nostrils daily for 14 days.    Dispense:  16 g    Refill:  0    Rest and push fluids Decadron prescribed Doxycycline prescribed.  Take as directed and to completion Continue with OTC ibuprofen/tylenol as needed for pain Follow up with PCP or [facility] if symptoms persists Return or go to the ED if you have any new or worsening symptoms such as fever, chills, worsening sinus pain/pressure, cough, sore throat, chest pain, shortness of breath, abdominal pain, changes in bowel or bladder habits, etc....   Reviewed expectations re: course of current medical issues. Questions answered. Outlined signs and symptoms indicating need for more acute intervention. Patient verbalized understanding. After Visit Summary given.         Durward Parcelvegno, Sharelle Burditt S, FNP 12/07/19 [phone]

## 2019-12-07 NOTE — ED Triage Notes (Signed)
Pt presents with continued nasal congestion for past few weeks right side facial pain and jaw pain have worsened

## 2019-12-22 ENCOUNTER — Telehealth (INDEPENDENT_AMBULATORY_CARE_PROVIDER_SITE_OTHER): Payer: BC Managed Care – PPO | Admitting: Psychiatry

## 2019-12-22 ENCOUNTER — Other Ambulatory Visit: Payer: Self-pay

## 2019-12-22 ENCOUNTER — Encounter: Payer: Self-pay | Admitting: Psychiatry

## 2019-12-22 DIAGNOSIS — F901 Attention-deficit hyperactivity disorder, predominantly hyperactive type: Secondary | ICD-10-CM

## 2019-12-22 DIAGNOSIS — F3176 Bipolar disorder, in full remission, most recent episode depressed: Secondary | ICD-10-CM

## 2019-12-22 MED ORDER — LAMOTRIGINE 25 MG PO TABS: 75.0000 mg | ORAL_TABLET | ORAL | 0 refills | Status: DC

## 2019-12-22 NOTE — Progress Notes (Signed)
Provider Location : [facility] Patient Location : Home  Participants: Patient , Provider  Virtual Visit via Video Note  I connected with [name] on 12/22/19 at 10:40 AM EDT by a video enabled telemedicine application and verified that I am speaking with the correct person using two identifiers.   I discussed the limitations of evaluation and management by telemedicine and the availability of in person appointments. The patient expressed understanding and agreed to proceed.    I discussed the assessment and treatment plan with the patient. The patient was provided an opportunity to ask questions and all were answered. The patient agreed with the plan and demonstrated an understanding of the instructions.   The patient was advised to call back or seek an in-person evaluation if the symptoms worsen or if the condition fails to improve as anticipated.  [facility] MD OP Progress Note  12/22/2019 12:15 PM AMENDA DUCLOS  MRN:  [id number]  Chief Complaint:  Chief Complaint    Follow-up     HPI: BRYN SALINE is a 29 year old female, single, lives in [place], employed ,has a history of bipolar disorder type II, ADHD, migraine headaches, hidradenitis was evaluated by telemedicine today.  Patient today reports she is currently doing well with regards to her mood.  She is tolerating the Lamictal well.  Denies any side effects.  Patient reports she continues to take the Ritalin and her ADHD symptoms are stable.  She reports sleep is good.  She is currently recovering from sinusitis and completed a course of antibiotics and prednisone.  Patient denies any suicidality, homicidality or perceptual disturbances.  Patient reports work is going well, she currently works part-time.  She denies any other concerns today.  Visit Diagnosis:    ICD-10-CM   1. Bipolar disorder, in full remission, most recent episode depressed (HCC)  F31.76 lamoTRIgine (LAMICTAL) 25 MG tablet   type 2  2. Attention  deficit hyperactivity disorder (ADHD), predominantly hyperactive type  F90.1     Past Psychiatric History: I have reviewed past psychiatric history from my progress note on 08/04/2019.  Ritalin, Daytrana patch, Adderall, risperidone, Concerta  Past Medical History:  Past Medical History:  Diagnosis Date  . ADHD (attention deficit hyperactivity disorder)   . Chronic tonsillitis 08/2013   current strep, will finish antibiotic 09/04/2013; snores during sleep, mother denies apnea  . Family history of adverse reaction to anesthesia    " My cousin had problems waking up. "  . Heart murmur    "when I was born; outgrew it; I was a preemie"  . Hidradenitis   . Hidradenitis 2017  . Migraine 12/19/2016   "I've just had this one for 3 days" (12/20/2016)    Past Surgical History:  Procedure Laterality Date  . AXILLARY HIDRADENITIS EXCISION    . AXILLARY LYMPH NODE BIOPSY Left 01/06/2019  . CYSTOSCOPY W/ URETERAL STENT PLACEMENT Right 05/28/2017   Procedure: CYSTOSCOPY WITH RETROGRADE PYELOGRAM/ RIGHT URETERAL STENT PLACEMENT;  Surgeon: Crist Fat, MD;  Location: [facility];  Service: Urology;  Laterality: Right;  . HYDRADENITIS EXCISION Bilateral 04/03/2017   Procedure: EXCISION AND DRAINAGE BILATERAL  HIDRADENITIS AXILLA;  Surgeon: Griselda Miner, MD;  Location[facility] OR;  Service: General;  Laterality: Bilateral;  . INCISION AND DRAINAGE ABSCESS Right 07/20/2016   Procedure: INCISION AND DRAINAGE ABSCESS right axilla;  Surgeon: Abigail Miyamoto, MD;  Location: [facility] OR;  Service: General;  Laterality: Right;  . INTRAUTERINE DEVICE (IUD) INSERTION  11/2016   "had the one  in my left arm removed"  . IRRIGATION AND DEBRIDEMENT ABSCESS Right 12/21/2016   Procedure: IRRIGATION AND DEBRIDEMENT RIGHT AXILLARY HIDRADENITIS;  Surgeon: Andria Meuse, MD;  Location: [facility] OR;  Service: General;  Laterality: Right;  . IRRIGATION AND DEBRIDEMENT ABSCESS Left 01/08/2017   Procedure: IRRIGATION AND DEBRIDEMENT  AXILLARY ABSCESS;  Surgeon: Griselda Miner, MD;  Location: [facility] OR;  Service: General;  Laterality: Left;  . NASAL SEPTOPLASTY W/ TURBINOPLASTY Bilateral 02/25/2019   Procedure: NASAL SEPTOPLASTY WITH TURBINATE REDUCTION;  Surgeon: Serena Colonel, MD;  Location: [facility];  Service: ENT;  Laterality: Bilateral;  . TONSILLECTOMY Bilateral 09/14/2013   Procedure: BILATERAL TONSILLECTOMY;  Surgeon: Serena Colonel, MD;  Location: [facility];  Service: ENT;  Laterality: Bilateral;  . TYMPANOPLASTY Bilateral    "rebuilt eardrums"  . WISDOM TOOTH EXTRACTION      Family Psychiatric History: I have reviewed family psychiatric history from my progress note on 08/04/2019  Family History:  Family History  Problem Relation Age of Onset  . Hypertension Mother   . Hyperlipidemia Mother   . Diabetes Mother   . Heart attack Father 60  . Parkinson's disease Maternal Grandmother   . Dementia Maternal Grandmother   . Diabetes Maternal Grandmother   . Bell's palsy Maternal Grandmother   . Alcohol abuse Paternal Uncle     Social History: I have reviewed social history from my progress note on 08/04/2019 Social History   Socioeconomic History  . Marital status: Single    Spouse name: Not on file  . Number of children: Not on file  . Years of education: EMS/Fire training  . Highest education level: Not on file  Occupational History  . Not on file  Tobacco Use  . Smoking status: Never Smoker  . Smokeless tobacco: Never Used  Vaping Use  . Vaping Use: Former  Substance and Sexual Activity  . Alcohol use: Yes    Comment: occasional-about 2 times a month, 1 drink  . Drug use: Not Currently    Comment: in high school  . Sexual activity: Yes    Birth control/protection: Pill, Condom  Other Topics Concern  . Not on file  Social History Narrative   02/11/19   From: the area   Living: with Mom currently, working to find her own place   Work: EMS/Fire      Family: good  relationship with Mom and Grandma      Enjoys: spend time with friends/family, hunting, bomb fire      Exercise: every shift does training - 2-3 times a week   Diet: well rounded, meat heavy      Safety   Seat belts: Yes    Guns: Yes  and secure   Safe in relationships: Yes    Helmet: no    Social Determinants of Health   Financial Resource Strain: Low Risk   . Difficulty of Paying Living Expenses: Not hard at all  Food Insecurity:   . Worried About Programme researcher, broadcasting/film/video in the Last Year: Not on file  . Ran Out of Food in the Last Year: Not on file  Transportation Needs:   . Lack of Transportation (Medical): Not on file  . Lack of Transportation (Non-Medical): Not on file  Physical Activity:   . Days of Exercise per Week: Not on file  . Minutes of Exercise per Session: Not on file  Stress:   . Feeling of Stress : Not on file  Social Connections:   .  Frequency of Communication with Friends and Family: Not on file  . Frequency of Social Gatherings with Friends and Family: Not on file  . Attends Religious Services: Not on file  . Active Member of Clubs or Organizations: Not on file  . Attends Banker Meetings: Not on file  . Marital Status: Not on file    Allergies:  Allergies  Allergen Reactions  . Other Hives    ABD pad causes rash Pt can only tolerate cloth tape Pt can only tolerate cloth tape  . Shellfish Allergy Anaphylaxis  . Tapentadol Hives and Other (See Comments)    Pt can only tolerate cloth tape  . Tape Hives and Other (See Comments)    Pt can only tolerate cloth tape  . Amoxicillin-Pot Clavulanate Other (See Comments)  . Morphine And Related     " I become very angry"  . Iodine Nausea Only  . Vancomycin Other (See Comments)    Burns her vein really bad and always causes them to blow.    Metabolic Disorder Labs: Lab Results  Component Value Date   HGBA1C 5.1 05/18/2019   No results found for: PROLACTIN Lab Results  Component Value  Date   CHOL 207 (H) 05/18/2019   TRIG 191.0 (H) 05/18/2019   HDL 49.80 05/18/2019   CHOLHDL 4 05/18/2019   VLDL 38.2 05/18/2019   LDLCALC 119 (H) 05/18/2019   LDLCALC 116 (H) 12/03/2016   Lab Results  Component Value Date   TSH 1.32 08/05/2019   TSH 2.45 12/03/2016    Therapeutic Level Labs: No results found for: LITHIUM No results found for: VALPROATE No components found for:  CBMZ  Current Medications: Current Outpatient Medications  Medication Sig Dispense Refill  . albuterol (VENTOLIN HFA) 108 (90 Base) MCG/ACT inhaler INHALE 2 PUFFS BY MOUTH EVERY 6 HOURS AS NEEDED 8.5 each 5  . azithromycin (ZITHROMAX) 250 MG tablet 2 tab po x 1 day then 1 tab po daily Fill if not improving in 3-4 days, VOID after 12/08/2019 6 tablet 0  . clindamycin (CLEOCIN) 300 MG capsule Take 300 mg by mouth 2 (two) times daily.    . diphenhydrAMINE (BENADRYL) 25 mg capsule Take 25 mg by mouth every 6 (six) hours as needed for itching or allergies.     . drospirenone-ethinyl estradiol ([name]) 3-0.02 MG tablet [name] (28) 3 mg-0.02 mg tablet  TAKE 1 TABLET BY MOUTH EVERY DAY    . guaiFENesin-codeine (ROBITUSSIN AC) 100-10 MG/5ML syrup Take 5-10 mLs by mouth at bedtime as needed for cough. 180 mL 0  . ibuprofen (ADVIL) 800 MG tablet Take 1 tablet (800 mg total) by mouth 3 (three) times daily. 21 tablet 0  . inFLIXimab (REMICADE IV) Inject into the vein.    Marland Kitchen lamoTRIgine (LAMICTAL) 25 MG tablet Take 3 tablets (75 mg total) by mouth as directed. Take 1 tablet in the AM and 2 tablets daily PM 270 tablet 0  . methylphenidate (RITALIN) 10 MG tablet Take 1 tablet (10 mg total) by mouth daily as needed (afternoon ADHD symptoms). 30 tablet 0  . methylphenidate (RITALIN) 20 MG tablet Take 1 tablet (20 mg total) by mouth daily with breakfast. 30 tablet 0  . [START ON 01/17/2020] methylphenidate (RITALIN) 20 MG tablet Take 1 tablet (20 mg total) by mouth daily with breakfast. 30 tablet 0  . methylphenidate (RITALIN) 20  MG tablet Take 1 tablet (20 mg total) by mouth daily with breakfast. 30 tablet 0  . montelukast (SINGULAIR) 10  MG tablet Take 1 tablet (10 mg total) by mouth at bedtime. 90 tablet 3  . ondansetron (ZOFRAN ODT) 4 MG disintegrating tablet Take 1 tablet (4 mg total) by mouth every 8 (eight) hours as needed for nausea or vomiting. 20 tablet 0  . predniSONE (DELTASONE) 20 MG tablet 3 tabs by mouth daily x 3 days, then 2 tabs by mouth daily x 2 days then 1 tab by mouth daily x 2 days 15 tablet 0  . spironolactone (ALDACTONE) 100 MG tablet Take 1 tablet by mouth daily.     . Vitamin D, Ergocalciferol, (DRISDOL) 1.25 MG (50000 UNIT) CAPS capsule Take 1 capsule (50,000 Units total) by mouth every 7 (seven) days. 4 capsule 1  . doxycycline (VIBRAMYCIN) 100 MG capsule Take 1 capsule (100 mg total) by mouth 2 (two) times daily. (Patient not taking: Reported on 12/22/2019) 20 capsule 0  . fluticasone (FLONASE) 50 MCG/ACT nasal spray Place 1 spray into both nostrils daily for 14 days. 16 g 0   No current facility-administered medications for this visit.     Musculoskeletal: Strength & Muscle Tone: UTA Gait & Station: normal Patient leans: N/A  Psychiatric Specialty Exam: Review of Systems  HENT: Positive for sinus pressure (Improving).   Psychiatric/Behavioral: Negative for agitation, behavioral problems, confusion, decreased concentration, dysphoric mood, hallucinations, self-injury and sleep disturbance. The patient is not nervous/anxious and is not hyperactive.   All other systems reviewed and are negative.   There were no vitals taken for this visit.There is no height or weight on file to calculate BMI.  General Appearance: Casual  Eye Contact:  Fair  Speech:  Clear and Coherent  Volume:  Normal  Mood:  Euthymic  Affect:  Congruent  Thought Process:  Goal Directed and Descriptions of Associations: Intact  Orientation:  Full (Time, Place, and Person)  Thought Content: Logical   Suicidal  Thoughts:  No  Homicidal Thoughts:  No  Memory:  Immediate;   Fair Recent;   Fair Remote;   Fair  Judgement:  Fair  Insight:  Fair  Psychomotor Activity:  Normal  Concentration:  Concentration: Fair and Attention Span: Fair  Recall:  [employer] of Knowledge: Fair  Language: Fair  Akathisia:  No  Handed:  Right  AIMS (if indicated): UTA  Assets:  Communication Skills Desire for Improvement Housing Social Support  ADL's:  Intact  Cognition: WNL  Sleep:  Fair   Screenings: GAD-7     Office Visit from 01/20/2019 in [facility]  Total GAD-7 Score 3    PHQ2-9     Office Visit from 01/20/2019 in [facility] Office Visit from 04/23/2017 in [facility]  PHQ-2 Total Score 2 0  PHQ-9 Total Score 6 --       Assessment and Plan: EDWARDINE DESCHEPPER is a 29 year old Caucasian female, employed, lives in [place], has a history of ADHD, migraine headaches, bipolar disorder type II was evaluated by telemedicine today.  Patient is currently making progress on the current medication regimen.  Plan as noted below.  Plan Bipolar disorder type II in remission Lamotrigine 75 mg p.o. daily divided dosage Continue melatonin as needed  ADHD-stable Ritalin 20 mg p.o. daily in the morning and 10 mg in the afternoon Prescribed per primary care provider. Reviewed [place] controlled substance database.  Follow-up in clinic in 3 months or sooner if needed.  I have spent atleast 20 minutes face to face by video with patient today. More  than 50 % of the time was spent for preparing to see the patient ( e.g., review of test, records ),  ordering medications and test ,psychoeducation and supportive psychotherapy and care coordination,as well as documenting clinical information in electronic health record. This note was generated in part or whole with voice recognition software. Voice recognition is usually quite accurate but there are  transcription errors that can and very often do occur. I apologize for any typographical errors that were not detected and corrected.        Jomarie LongsSaramma Johsua Shevlin, MD 12/22/2019, 12:15 PM

## 2019-12-25 ENCOUNTER — Encounter: Payer: Self-pay | Admitting: Family Medicine

## 2019-12-25 DIAGNOSIS — R197 Diarrhea, unspecified: Secondary | ICD-10-CM | POA: Diagnosis not present

## 2019-12-25 DIAGNOSIS — Z793 Long term (current) use of hormonal contraceptives: Secondary | ICD-10-CM | POA: Diagnosis not present

## 2019-12-25 DIAGNOSIS — Z87892 Personal history of anaphylaxis: Secondary | ICD-10-CM | POA: Diagnosis not present

## 2019-12-25 DIAGNOSIS — F988 Other specified behavioral and emotional disorders with onset usually occurring in childhood and adolescence: Secondary | ICD-10-CM | POA: Diagnosis not present

## 2019-12-25 DIAGNOSIS — A082 Adenoviral enteritis: Secondary | ICD-10-CM | POA: Diagnosis not present

## 2019-12-25 DIAGNOSIS — R110 Nausea: Secondary | ICD-10-CM | POA: Diagnosis not present

## 2019-12-25 DIAGNOSIS — N390 Urinary tract infection, site not specified: Secondary | ICD-10-CM | POA: Diagnosis not present

## 2019-12-25 DIAGNOSIS — Z91018 Allergy to other foods: Secondary | ICD-10-CM | POA: Diagnosis not present

## 2019-12-25 DIAGNOSIS — A084 Viral intestinal infection, unspecified: Secondary | ICD-10-CM | POA: Diagnosis not present

## 2019-12-25 DIAGNOSIS — F319 Bipolar disorder, unspecified: Secondary | ICD-10-CM | POA: Diagnosis not present

## 2019-12-25 DIAGNOSIS — R1031 Right lower quadrant pain: Secondary | ICD-10-CM | POA: Diagnosis not present

## 2019-12-25 DIAGNOSIS — Z791 Long term (current) use of non-steroidal anti-inflammatories (NSAID): Secondary | ICD-10-CM | POA: Diagnosis not present

## 2019-12-25 DIAGNOSIS — Z91048 Other nonmedicinal substance allergy status: Secondary | ICD-10-CM | POA: Diagnosis not present

## 2019-12-25 DIAGNOSIS — Z885 Allergy status to narcotic agent status: Secondary | ICD-10-CM | POA: Diagnosis not present

## 2019-12-25 DIAGNOSIS — Z883 Allergy status to other anti-infective agents status: Secondary | ICD-10-CM | POA: Diagnosis not present

## 2019-12-25 DIAGNOSIS — Z79899 Other long term (current) drug therapy: Secondary | ICD-10-CM | POA: Diagnosis not present

## 2019-12-28 ENCOUNTER — Ambulatory Visit: Admit: 2019-12-28 | Discharge: 2019-12-29 | Payer: PRIVATE HEALTH INSURANCE

## 2019-12-28 DIAGNOSIS — L732 Hidradenitis suppurativa: Secondary | ICD-10-CM | POA: Diagnosis not present

## 2020-01-07 ENCOUNTER — Other Ambulatory Visit: Payer: Self-pay

## 2020-01-07 ENCOUNTER — Encounter: Payer: Self-pay | Admitting: Family Medicine

## 2020-01-07 ENCOUNTER — Other Ambulatory Visit: Payer: Self-pay | Admitting: Family Medicine

## 2020-01-07 ENCOUNTER — Ambulatory Visit: Payer: BC Managed Care – PPO | Admitting: Family Medicine

## 2020-01-07 VITALS — BP 100/80 | HR 92 | Temp 98.1°F | Ht 61.0 in | Wt 208.2 lb

## 2020-01-07 DIAGNOSIS — L732 Hidradenitis suppurativa: Secondary | ICD-10-CM | POA: Diagnosis not present

## 2020-01-07 DIAGNOSIS — D72829 Elevated white blood cell count, unspecified: Secondary | ICD-10-CM

## 2020-01-07 DIAGNOSIS — F901 Attention-deficit hyperactivity disorder, predominantly hyperactive type: Secondary | ICD-10-CM

## 2020-01-07 DIAGNOSIS — E559 Vitamin D deficiency, unspecified: Secondary | ICD-10-CM

## 2020-01-07 MED ORDER — METHYLPHENIDATE HCL 20 MG PO TABS: 20.0000 mg | ORAL_TABLET | Freq: Every day | ORAL | 0 refills | Status: DC

## 2020-01-07 NOTE — Progress Notes (Signed)
Subjective:     Courtney [name] is a 29 y.o. female presenting for Hospitalization Follow-up (X 1 week )     HPI  #Diarrhea - resolved  Did not have urine symptoms  No longer feeling nausea/vomiting  #ADHD - doing well on ritalin - rare use of afternoon dose   Review of Systems  12/25/2019: ER - abdominal pain and diarrhea - Ct with ileus. Stool - adenovirus and UTI treated w/ cipro.   Social History   Tobacco Use  Smoking Status Never Smoker  Smokeless Tobacco Never Used        Objective:    BP Readings from Last 3 Encounters:  01/07/20 100/80  12/07/19 106/65  11/17/19 110/62   Wt Readings from Last 3 Encounters:  01/07/20 208 lb 4 oz (94.5 kg)  11/17/19 203 lb 8 oz (92.3 kg)  08/12/19 180 lb (81.6 kg)    BP 100/80   Pulse 92   Temp 98.1 F (36.7 C) (Temporal)   Ht 5\' 1"  (1.549 m)   Wt 208 lb 4 oz (94.5 kg)   SpO2 98%   BMI 39.35 kg/m    Physical Exam Constitutional:      General: She is not in acute distress.    Appearance: She is well-developed. She is not diaphoretic.  HENT:     Right Ear: External ear normal.     Left Ear: External ear normal.     Nose: Nose normal.  Eyes:     Conjunctiva/sclera: Conjunctivae normal.  Cardiovascular:     Rate and Rhythm: Normal rate and regular rhythm.     Heart sounds: No murmur heard.   Pulmonary:     Effort: Pulmonary effort is normal. No respiratory distress.     Breath sounds: Normal breath sounds. No wheezing.  Musculoskeletal:     Cervical back: Neck supple.  Skin:    General: Skin is warm and dry.     Capillary Refill: Capillary refill takes less than 2 seconds.  Neurological:     Mental Status: She is alert. Mental status is at baseline.  Psychiatric:        Mood and Affect: Mood normal.        Behavior: Behavior normal.    Reviewed outside labs Elevated wbc which improved on repeat       Assessment & Plan:   Problem List Items Addressed This Visit      Musculoskeletal  and Integument   Axillary hidradenitis suppurativa    Doing well with infliximab followed by dermatology        Other   Attention deficit hyperactivity disorder (ADHD), predominantly hyperactive type - Primary    Continues to do well. Cont ritalin 20 mg AM and prn 10 mg PM. Return in 3 months      Relevant Medications   methylphenidate (RITALIN) 20 MG tablet (Start on 02/06/2020)   methylphenidate (RITALIN) 20 MG tablet (Start on 03/08/2020)    Other Visit Diagnoses    Leukocytosis, unspecified type         Leukocystosis -- infectious in setting of diarrhea and UTI with improvement prior to infusion. Also suspect 2/2 to hidradenitis. Anticipate repeat at annual exam.   Return in about 3 months (around 04/08/2020).  06/06/2020, MD  This visit occurred during the SARS-CoV-2 public health emergency.  Safety protocols were in place, including screening questions prior to the visit, additional usage of staff PPE, and extensive cleaning of exam room while observing appropriate  contact time as indicated for disinfecting solutions.

## 2020-01-07 NOTE — Patient Instructions (Signed)
Change your December appt to late January/early February (last refill is dated Jan 4)   Glad you are better

## 2020-01-07 NOTE — Assessment & Plan Note (Signed)
Continues to do well. Cont ritalin 20 mg AM and prn 10 mg PM. Return in 3 months

## 2020-01-07 NOTE — Assessment & Plan Note (Signed)
Doing well with infliximab followed by dermatology

## 2020-01-20 DIAGNOSIS — L732 Hidradenitis suppurativa: Principal | ICD-10-CM

## 2020-02-08 ENCOUNTER — Encounter: Admit: 2020-02-08 | Discharge: 2020-02-09 | Payer: PRIVATE HEALTH INSURANCE

## 2020-02-08 DIAGNOSIS — L732 Hidradenitis suppurativa: Principal | ICD-10-CM

## 2020-02-15 ENCOUNTER — Other Ambulatory Visit: Payer: Self-pay | Admitting: *Deleted

## 2020-02-15 NOTE — Telephone Encounter (Signed)
Patient left a voicemail requesting a refill on her Ritalin 20 mg Last office visit 01/07/20 Upcoming appointment 04/11/20 Last refill 01/07/20 #30 Last UDS 01/20/19

## 2020-02-15 NOTE — Telephone Encounter (Signed)
She should have 1-2 additional refills at the pharmacy. She should contact her pharmacy

## 2020-02-16 ENCOUNTER — Ambulatory Visit: Payer: BC Managed Care – PPO | Admitting: Family Medicine

## 2020-02-17 NOTE — Telephone Encounter (Signed)
Pt left v/m that she only has one pill left of ritalin 20 mg. I spoke with Sharyn Creamer pharmacist at [facility] and [place] said that the ritalin 20 mg rx was ready for pick up with $0 copay. Pt notified that ritalin 20 mg was ready for pick up with $0 copay at [facility] and pt voiced understanding and said she had already found that out and nothing further needed.

## 2020-02-28 ENCOUNTER — Ambulatory Visit
Admission: RE | Admit: 2020-02-28 | Discharge: 2020-02-28 | Disposition: A | Discharge status: Home / Self Care | Payer: BC Managed Care – PPO | Source: Ambulatory Visit | Attending: Family Medicine | Admitting: Family Medicine

## 2020-02-28 ENCOUNTER — Other Ambulatory Visit: Payer: Self-pay

## 2020-02-28 VITALS — BP 120/80 | HR 125 | Temp 98.4°F | Resp 18

## 2020-02-28 DIAGNOSIS — J039 Acute tonsillitis, unspecified: Secondary | ICD-10-CM | POA: Insufficient documentation

## 2020-02-28 DIAGNOSIS — R000 Tachycardia, unspecified: Secondary | ICD-10-CM | POA: Insufficient documentation

## 2020-02-28 LAB — POCT RAPID STREP A (OFFICE): Rapid Strep A Screen: NEGATIVE

## 2020-02-28 MED ORDER — FLUCONAZOLE 150 MG PO TABS: ORAL_TABLET | ORAL | 0 refills | Status: DC

## 2020-02-28 MED ORDER — AZITHROMYCIN 250 MG PO TABS: 250.0000 mg | ORAL_TABLET | Freq: Every day | ORAL | 0 refills | Status: DC

## 2020-02-28 NOTE — ED Provider Notes (Signed)
[facility]   [id number] 02/28/20 Arrival Time: 1044  WU:[id number]:SORE THROAT  SUBJECTIVE: History from: patient.  Alveria ApleyBrandy L Welcher is a 29 y.o. female who presents with abrupt onset of sore throat, nasal congestion for the last 3 days. Had negative rapid Covid test at home yesterday. Denies sick exposure to Covid, strep, flu or mono, or precipitating event. Has tried tylenol without relief. Has negative history of Covid. Has completed Covid vaccines. Symptoms are made worse with swallowing, but tolerating liquids and own secretions without difficulty.  Denies previous symptoms in the past.     Denies fever, chills, fatigue, ear pain, sinus pain, rhinorrhea, cough, SOB, wheezing, chest pain, nausea, rash, changes in bowel or bladder habits.     ROS: As per HPI.  All other pertinent ROS negative.     Past Medical History:  Diagnosis Date  . ADHD (attention deficit hyperactivity disorder)   . Chronic tonsillitis 08/2013   current strep, will finish antibiotic 09/04/2013; snores during sleep, mother denies apnea  . Family history of adverse reaction to anesthesia    " My cousin had problems waking up. "  . Heart murmur    "when I was born; outgrew it; I was a preemie"  . Hidradenitis   . Hidradenitis 2017  . Migraine 12/19/2016   "I've just had this one for 3 days" (12/20/2016)   Past Surgical History:  Procedure Laterality Date  . AXILLARY HIDRADENITIS EXCISION    . AXILLARY LYMPH NODE BIOPSY Left 01/06/2019  . CYSTOSCOPY W/ URETERAL STENT PLACEMENT Right 05/28/2017   Procedure: CYSTOSCOPY WITH RETROGRADE PYELOGRAM/ RIGHT URETERAL STENT PLACEMENT;  Surgeon: Crist FatHerrick, Benjamin W, MD;  Location: [facility];  Service: Urology;  Laterality: Right;  . HYDRADENITIS EXCISION Bilateral 04/03/2017   Procedure: EXCISION AND DRAINAGE BILATERAL  HIDRADENITIS AXILLA;  Surgeon: Griselda Mineroth, Paul III, MD;  [facility];  Service: General;  Laterality: Bilateral;  . INCISION AND DRAINAGE ABSCESS Right  07/20/2016   Procedure: INCISION AND DRAINAGE ABSCESS right axilla;  Surgeon: Abigail MiyamotoBlackman, Douglas, MD;  [facility];  Service: General;  Laterality: Right;  . INTRAUTERINE DEVICE (IUD) INSERTION  11/2016   "had the one in my left arm removed"  . IRRIGATION AND DEBRIDEMENT ABSCESS Right 12/21/2016   Procedure: IRRIGATION AND DEBRIDEMENT RIGHT AXILLARY HIDRADENITIS;  Surgeon: Andria MeuseWhite, Christopher M, MD;  Location: [facility];  Service: General;  Laterality: Right;  . IRRIGATION AND DEBRIDEMENT ABSCESS Left 01/08/2017   Procedure: IRRIGATION AND DEBRIDEMENT AXILLARY ABSCESS;  Surgeon: Griselda Mineroth, Paul III, MD;  [facility];  Service: General;  Laterality: Left;  . NASAL SEPTOPLASTY W/ TURBINOPLASTY Bilateral 02/25/2019   Procedure: NASAL SEPTOPLASTY WITH TURBINATE REDUCTION;  Surgeon: Serena Colonelosen, Jefry, MD;  Location: [facility];  Service: ENT;  Laterality: Bilateral;  . TONSILLECTOMY Bilateral 09/14/2013   Procedure: BILATERAL TONSILLECTOMY;  Surgeon: Serena ColonelJefry Rosen, MD;  Location: [facility];  Service: ENT;  Laterality: Bilateral;  . TYMPANOPLASTY Bilateral    "rebuilt eardrums"  . WISDOM TOOTH EXTRACTION     Allergies  Allergen Reactions  . Other Hives    ABD pad causes rash Pt can only tolerate cloth tape Pt can only tolerate cloth tape  . Tapentadol Hives and Other (See Comments)    Pt can only tolerate cloth tape  . Tape Hives and Other (See Comments)    Pt can only tolerate cloth tape  . Amoxicillin-Pot Clavulanate Other (See Comments)  . Ginger   . Morphine And Related     "  I become very angry"  . Iodine Nausea Only  . Vancomycin Other (See Comments)    Burns her vein really bad and always causes them to blow.   No current facility-administered medications on file prior to encounter.   Current Outpatient Medications on File Prior to Encounter  Medication Sig Dispense Refill  . albuterol (VENTOLIN HFA) 108 (90 Base) MCG/ACT inhaler INHALE 2 PUFFS BY MOUTH EVERY  6 HOURS AS NEEDED 8.5 each 5  . ciprofloxacin (CIPRO) 500 MG tablet Take 500 mg by mouth 2 (two) times daily.    . clindamycin (CLEOCIN) 300 MG capsule Take 300 mg by mouth 2 (two) times daily.    . diphenhydrAMINE (BENADRYL) 25 mg capsule Take 25 mg by mouth every 6 (six) hours as needed for itching or allergies.     . drospirenone-ethinyl estradiol ([name]) 3-0.02 MG tablet [name] (28) 3 mg-0.02 mg tablet  TAKE 1 TABLET BY MOUTH EVERY DAY    . fluticasone (FLONASE) 50 MCG/ACT nasal spray Place 1 spray into both nostrils daily for 14 days. 16 g 0  . HYDROcodone-acetaminophen (NORCO/VICODIN) 5-325 MG tablet Take 1 tablet by mouth 4 (four) times daily as needed.    Marland Kitchen ibuprofen (ADVIL) 800 MG tablet Take 1 tablet (800 mg total) by mouth 3 (three) times daily. 21 tablet 0  . inFLIXimab (REMICADE IV) Inject into the vein.    Marland Kitchen lamoTRIgine (LAMICTAL) 25 MG tablet Take 3 tablets (75 mg total) by mouth as directed. Take 1 tablet in the AM and 2 tablets daily PM 270 tablet 0  . methylphenidate (RITALIN) 10 MG tablet Take 1 tablet (10 mg total) by mouth daily as needed (afternoon ADHD symptoms). 30 tablet 0  . methylphenidate (RITALIN) 20 MG tablet Take 1 tablet (20 mg total) by mouth daily with breakfast. 30 tablet 0  . methylphenidate (RITALIN) 20 MG tablet Take 1 tablet (20 mg total) by mouth daily with breakfast. 30 tablet 0  . methylphenidate (RITALIN) 20 MG tablet Take 1 tablet (20 mg total) by mouth daily. 30 tablet 0  . [START ON 03/08/2020] methylphenidate (RITALIN) 20 MG tablet Take 1 tablet (20 mg total) by mouth daily. 30 tablet 0  . montelukast (SINGULAIR) 10 MG tablet Take 1 tablet (10 mg total) by mouth at bedtime. 90 tablet 3  . ondansetron (ZOFRAN ODT) 4 MG disintegrating tablet Take 1 tablet (4 mg total) by mouth every 8 (eight) hours as needed for nausea or vomiting. 20 tablet 0  . Vitamin D, Ergocalciferol, (DRISDOL) 1.25 MG (50000 UNIT) CAPS capsule Take 1 capsule (50,000 Units total) by  mouth every 7 (seven) days. 4 capsule 1  . [DISCONTINUED] mometasone (NASONEX) 50 MCG/ACT nasal spray Place 2 sprays into the nose daily. (Patient not taking: Reported on 05/13/2018) 17 g 0   Social History   Socioeconomic History  . Marital status: Single    Spouse name: Not on file  . Number of children: Not on file  . Years of education: EMS/Fire training  . Highest education level: Not on file  Occupational History  . Not on file  Tobacco Use  . Smoking status: Never Smoker  . Smokeless tobacco: Never Used  Vaping Use  . Vaping Use: Former  Substance and Sexual Activity  . Alcohol use: Yes    Comment: occasional-about 2 times a month, 1 drink  . Drug use: Not Currently    Comment: in high school  . Sexual activity: Yes    Birth control/protection: Pill, Condom  Other Topics Concern  . Not on file  Social History Narrative   02/11/19   From: the area   Living: with Mom currently, working to find her own place   Work: EMS/Fire      Family: good relationship with Mom and Grandma      Enjoys: spend time with friends/family, hunting, bomb fire      Exercise: every shift does training - 2-3 times a week   Diet: well rounded, meat heavy      Safety   Seat belts: Yes    Guns: Yes  and secure   Safe in relationships: Yes    Helmet: no    Social Determinants of Corporate investment banker Strain: Not on file  Food Insecurity: Not on file  Transportation Needs: Not on file  Physical Activity: Not on file  Stress: Not on file  Social Connections: Not on file  Intimate Partner Violence: Not on file   Family History  Problem Relation Age of Onset  . Hypertension Mother   . Hyperlipidemia Mother   . Diabetes Mother   . Heart attack Father 16  . Parkinson's disease Maternal Grandmother   . Dementia Maternal Grandmother   . Diabetes Maternal Grandmother   . Bell's palsy Maternal Grandmother   . Alcohol abuse Paternal Uncle     OBJECTIVE:  Vitals:   02/28/20  1129  BP: 120/80  Pulse: (!) 125  Resp: 18  Temp: 98.4 F (36.9 C)  TempSrc: Oral  SpO2: 98%     General appearance: alert; appears fatigued, but nontoxic, speaking in full sentences and managing own secretions HEENT: NCAT; Ears: EACs clear, TMs pearly gray with visible cone of light, without erythema; Eyes: PERRL, EOMI grossly; Nose: no obvious rhinorrhea; Throat: oropharynx erythematous with petechiae, tonsils 2+ and  erythematous with white tonsillar exudates, uvula midline Neck: supple with LAD Lungs: CTA bilaterally without adventitious breath sounds; cough absent Heart: regular rate and rhythm.  Radial pulses 2+ symmetrical bilaterally Skin: warm and dry Psychological: alert and cooperative; normal mood and affect  LABS: No results found for this or any previous visit (from the past 24 hour(s)).   ASSESSMENT & PLAN:  1. Acute tonsillitis, unspecified etiology   2. Tachycardia     Meds ordered this encounter  Medications  . azithromycin (ZITHROMAX) 250 MG tablet    Sig: Take 1 tablet (250 mg total) by mouth daily. Take first 2 tablets together, then 1 every day until finished.    Dispense:  6 tablet    Refill:  0    Order Specific Question:   Supervising Provider    Answer:   Merrilee Jansky [id number]  . fluconazole (DIFLUCAN) 150 MG tablet    Sig: Take one tablet at the onset of symptoms. If symptoms are still present 3 days later, take the second tablet.    Dispense:  2 tablet    Refill:  0    Order Specific Question:   Supervising Provider    Answer:   Merrilee Jansky [[id number]]   Will treat for tonsillitis Prescribed azithromycin Strep test negative, will send out for culture and we will call you with results Declines test for mono at this time Get plenty of rest and push fluids Take OTC Zyrtec and use chloraseptic spray as needed for throat pain. Drink warm or cool liquids, use throat lozenges, or popsicles to help alleviate symptoms Take OTC ibuprofen or  tylenol as needed for pain Follow up with  PCP if symptoms persists Return or go to ER if patient has any new or worsening symptoms such as fever, chills, nausea, vomiting, worsening sore throat, cough, abdominal pain, chest pain, changes in bowel or bladder habits  Reviewed expectations re: course of current medical issues. Questions answered. Outlined signs and symptoms indicating need for more acute intervention. Patient verbalized understanding. After Visit Summary given.          Moshe Cipro, NP 03/01/20 [phone]

## 2020-02-28 NOTE — Discharge Instructions (Signed)
I have sent in azithromycin for you to take. Take 2 tablets today, then one tablet daily for the next 4 days.  I have sent in fluconazole in case of yeast. Take one tablet at the onset of symptoms. If symptoms are still present in 3 days, take the second tablet.   Follow up with this office or with primary care if symptoms are persisting.  Follow up in the ER for high fever, trouble swallowing, trouble breathing, other concerning symptoms.

## 2020-02-28 NOTE — ED Triage Notes (Signed)
Pt presents with nasal congestion and sore throat for past few days, negative covid test yesterday

## 2020-03-01 LAB — CULTURE, GROUP A STREP (THRC)

## 2020-03-03 ENCOUNTER — Other Ambulatory Visit: Payer: Self-pay

## 2020-03-03 ENCOUNTER — Encounter: Payer: Self-pay | Admitting: Psychiatry

## 2020-03-03 ENCOUNTER — Telehealth (INDEPENDENT_AMBULATORY_CARE_PROVIDER_SITE_OTHER): Payer: BC Managed Care – PPO | Admitting: Psychiatry

## 2020-03-03 DIAGNOSIS — F901 Attention-deficit hyperactivity disorder, predominantly hyperactive type: Secondary | ICD-10-CM

## 2020-03-03 DIAGNOSIS — F3176 Bipolar disorder, in full remission, most recent episode depressed: Secondary | ICD-10-CM | POA: Diagnosis not present

## 2020-03-03 MED ORDER — LAMOTRIGINE 25 MG PO TABS: 75.0000 mg | ORAL_TABLET | ORAL | 0 refills | Status: DC

## 2020-03-03 NOTE — Progress Notes (Signed)
Virtual Visit via Telephone Note  I connected with Courtney [name] on 03/03/20 at  2:00 PM EST by telephone and verified that I am speaking with the correct person using two identifiers.  Location Provider Location : [facility] Patient Location : Home  Participants: Patient , Provider    I discussed the limitations, risks, security and privacy concerns of performing an evaluation and management service by telephone and the availability of in person appointments. I also discussed with the patient that there may be a patient responsible charge related to this service. The patient expressed understanding and agreed to proceed.    I discussed the assessment and treatment plan with the patient. The patient was provided an opportunity to ask questions and all were answered. The patient agreed with the plan and demonstrated an understanding of the instructions.   The patient was advised to call back or seek an in-person evaluation if the symptoms worsen or if the condition fails to improve as anticipated.   [facility] MD OP Progress Note  03/03/2020 6:16 PM Courtney [name]  MRN:  [id number]  Chief Complaint:  Chief Complaint    Follow-up     HPI: Courtney [name] is a 29 year old female, single, lives in [place], has a history of bipolar disorder type II, ADHD, migraine headaches, hidradenitis was evaluated by telemedicine today.  Patient today reports she lost her job recently.  She was on probation and was told that she needs more experience.  She is currently applying for other positions.  So far she is coping okay with it.  She denies any significant mood swings.  She reports the Lamictal is effective.  Denies side effects.  She reports sleep and appetite as good.  Patient denies any suicidality, homicidality or perceptual disturbances.  Patient reports she recently struggled with upper respiratory tract infection and just completed course of Z-Pak.  She is getting better.  Patient denies any  other concerns today.  Visit Diagnosis:    ICD-10-CM   1. Bipolar disorder, in full remission, most recent episode depressed (HCC)  F31.76 lamoTRIgine (LAMICTAL) 25 MG tablet   type 2  2. Attention deficit hyperactivity disorder (ADHD), predominantly hyperactive type  F90.1     Past Psychiatric History: I have reviewed past psychiatric history from my progress note on 08/04/2019.  Past trials of Ritalin, Daytrana patch, Adderall, risperidone, Concerta  Past Medical History:  Past Medical History:  Diagnosis Date  . ADHD (attention deficit hyperactivity disorder)   . Chronic tonsillitis 08/2013   current strep, will finish antibiotic 09/04/2013; snores during sleep, mother denies apnea  . Family history of adverse reaction to anesthesia    " My cousin had problems waking up. "  . Heart murmur    "when I was born; outgrew it; I was a preemie"  . Hidradenitis   . Hidradenitis 2017  . Migraine 12/19/2016   "I've just had this one for 3 days" (12/20/2016)    Past Surgical History:  Procedure Laterality Date  . AXILLARY HIDRADENITIS EXCISION    . AXILLARY LYMPH NODE BIOPSY Left 01/06/2019  . CYSTOSCOPY W/ URETERAL STENT PLACEMENT Right 05/28/2017   Procedure: CYSTOSCOPY WITH RETROGRADE PYELOGRAM/ RIGHT URETERAL STENT PLACEMENT;  Surgeon: Crist Fat, MD;  Location: [facility];  Service: Urology;  Laterality: Right;  . HYDRADENITIS EXCISION Bilateral 04/03/2017   Procedure: EXCISION AND DRAINAGE BILATERAL  HIDRADENITIS AXILLA;  Surgeon: Griselda Miner, MD;  Location[facility];  Service: General;  Laterality: Bilateral;  . INCISION  AND DRAINAGE ABSCESS Right 07/20/2016   Procedure: INCISION AND DRAINAGE ABSCESS right axilla;  Surgeon: Abigail MiyamotoBlackman, Douglas, MD;  [facility];  Service: General;  Laterality: Right;  . INTRAUTERINE DEVICE (IUD) INSERTION  11/2016   "had the one in my left arm removed"  . IRRIGATION AND DEBRIDEMENT ABSCESS Right 12/21/2016   Procedure: IRRIGATION AND DEBRIDEMENT  RIGHT AXILLARY HIDRADENITIS;  Surgeon: Andria MeuseWhite, Christopher M, MD;  Location: [facility] OR;  Service: General;  Laterality: Right;  . IRRIGATION AND DEBRIDEMENT ABSCESS Left 01/08/2017   Procedure: IRRIGATION AND DEBRIDEMENT AXILLARY ABSCESS;  Surgeon: Griselda Mineroth, Paul III, MD;  [facility];  Service: General;  Laterality: Left;  . NASAL SEPTOPLASTY W/ TURBINOPLASTY Bilateral 02/25/2019   Procedure: NASAL SEPTOPLASTY WITH TURBINATE REDUCTION;  Surgeon: Serena Colonelosen, Jefry, MD;  Location: [facility];  Service: ENT;  Laterality: Bilateral;  . TONSILLECTOMY Bilateral 09/14/2013   Procedure: BILATERAL TONSILLECTOMY;  Surgeon: Serena ColonelJefry Rosen, MD;  Location: [facility];  Service: ENT;  Laterality: Bilateral;  . TYMPANOPLASTY Bilateral    "rebuilt eardrums"  . WISDOM TOOTH EXTRACTION      Family Psychiatric History: I have reviewed family psychiatric history from my progress note on 08/04/2019  Family History:  Family History  Problem Relation Age of Onset  . Hypertension Mother   . Hyperlipidemia Mother   . Diabetes Mother   . Heart attack Father 2250  . Parkinson's disease Maternal Grandmother   . Dementia Maternal Grandmother   . Diabetes Maternal Grandmother   . Bell's palsy Maternal Grandmother   . Alcohol abuse Paternal Uncle     Social History: I have reviewed social history from my progress note on 08/04/2019 Social History   Socioeconomic History  . Marital status: Single    Spouse name: Not on file  . Number of children: Not on file  . Years of education: EMS/Fire training  . Highest education level: Not on file  Occupational History  . Not on file  Tobacco Use  . Smoking status: Never Smoker  . Smokeless tobacco: Never Used  Vaping Use  . Vaping Use: Former  Substance and Sexual Activity  . Alcohol use: Yes    Comment: occasional-about 2 times a month, 1 drink  . Drug use: Not Currently    Comment: in high school  . Sexual activity: Yes    Birth  control/protection: Pill, Condom  Other Topics Concern  . Not on file  Social History Narrative   02/11/19   From: the area   Living: with Mom currently, working to find her own place   Work: EMS/Fire      Family: good relationship with Mom and Grandma      Enjoys: spend time with friends/family, hunting, bomb fire      Exercise: every shift does training - 2-3 times a week   Diet: well rounded, meat heavy      Safety   Seat belts: Yes    Guns: Yes  and secure   Safe in relationships: Yes    Helmet: no    Social Determinants of Corporate investment bankerHealth   Financial Resource Strain: Not on file  Food Insecurity: Not on file  Transportation Needs: Not on file  Physical Activity: Not on file  Stress: Not on file  Social Connections: Not on file    Allergies:  Allergies  Allergen Reactions  . Other Hives    ABD pad causes rash Pt can only tolerate cloth tape Pt can only tolerate cloth tape  .  Tapentadol Hives and Other (See Comments)    Pt can only tolerate cloth tape  . Tape Hives and Other (See Comments)    Pt can only tolerate cloth tape  . Amoxicillin-Pot Clavulanate Other (See Comments)  . Ginger   . Morphine And Related     " I become very angry"  . Iodine Nausea Only  . Vancomycin Other (See Comments)    Burns her vein really bad and always causes them to blow.    Metabolic Disorder Labs: Lab Results  Component Value Date   HGBA1C 5.1 05/18/2019   No results found for: PROLACTIN Lab Results  Component Value Date   CHOL 207 (H) 05/18/2019   TRIG 191.0 (H) 05/18/2019   HDL 49.80 05/18/2019   CHOLHDL 4 05/18/2019   VLDL 38.2 05/18/2019   LDLCALC 119 (H) 05/18/2019   LDLCALC 116 (H) 12/03/2016   Lab Results  Component Value Date   TSH 1.32 08/05/2019   TSH 2.45 12/03/2016    Therapeutic Level Labs: No results found for: LITHIUM No results found for: VALPROATE No components found for:  CBMZ  Current Medications: Current Outpatient Medications  Medication  Sig Dispense Refill  . albuterol (VENTOLIN HFA) 108 (90 Base) MCG/ACT inhaler INHALE 2 PUFFS BY MOUTH EVERY 6 HOURS AS NEEDED 8.5 each 5  . azithromycin (ZITHROMAX) 250 MG tablet Take 1 tablet (250 mg total) by mouth daily. Take first 2 tablets together, then 1 every day until finished. 6 tablet 0  . clindamycin (CLEOCIN) 300 MG capsule Take 300 mg by mouth 2 (two) times daily.    . diphenhydrAMINE (BENADRYL) 25 mg capsule Take 25 mg by mouth every 6 (six) hours as needed for itching or allergies.     . drospirenone-ethinyl estradiol ([name]) 3-0.02 MG tablet [name] (28) 3 mg-0.02 mg tablet  TAKE 1 TABLET BY MOUTH EVERY DAY    . fluconazole (DIFLUCAN) 150 MG tablet Take one tablet at the onset of symptoms. If symptoms are still present 3 days later, take the second tablet. 2 tablet 0  . fluticasone (FLONASE) 50 MCG/ACT nasal spray Place 1 spray into both nostrils daily for 14 days. 16 g 0  . HYDROcodone-acetaminophen (NORCO/VICODIN) 5-325 MG tablet Take 1 tablet by mouth 4 (four) times daily as needed.    Marland Kitchen ibuprofen (ADVIL) 800 MG tablet Take 1 tablet (800 mg total) by mouth 3 (three) times daily. 21 tablet 0  . inFLIXimab (REMICADE IV) Inject into the vein.    Marland Kitchen lamoTRIgine (LAMICTAL) 25 MG tablet Take 3 tablets (75 mg total) by mouth as directed. Take 1 tablet in the AM and 2 tablets daily PM 270 tablet 0  . methylphenidate (RITALIN) 10 MG tablet Take 1 tablet (10 mg total) by mouth daily as needed (afternoon ADHD symptoms). 30 tablet 0  . methylphenidate (RITALIN) 20 MG tablet Take 1 tablet (20 mg total) by mouth daily with breakfast. 30 tablet 0  . methylphenidate (RITALIN) 20 MG tablet Take 1 tablet (20 mg total) by mouth daily with breakfast. 30 tablet 0  . methylphenidate (RITALIN) 20 MG tablet Take 1 tablet (20 mg total) by mouth daily. 30 tablet 0  . [START ON 03/08/2020] methylphenidate (RITALIN) 20 MG tablet Take 1 tablet (20 mg total) by mouth daily. 30 tablet 0  . montelukast (SINGULAIR)  10 MG tablet Take 1 tablet (10 mg total) by mouth at bedtime. 90 tablet 3  . ondansetron (ZOFRAN ODT) 4 MG disintegrating tablet Take 1 tablet (4 mg  total) by mouth every 8 (eight) hours as needed for nausea or vomiting. 20 tablet 0  . Vitamin D, Ergocalciferol, (DRISDOL) 1.25 MG (50000 UNIT) CAPS capsule Take 1 capsule (50,000 Units total) by mouth every 7 (seven) days. 4 capsule 1   No current facility-administered medications for this visit.     Musculoskeletal: Strength & Muscle Tone: UTA Gait & Station: UTA Patient leans: N/A  Psychiatric Specialty Exam: Review of Systems  HENT: Positive for congestion.   Psychiatric/Behavioral: Negative for agitation, behavioral problems, confusion, decreased concentration, dysphoric mood, hallucinations, self-injury, sleep disturbance and suicidal ideas. The patient is not nervous/anxious and is not hyperactive.   All other systems reviewed and are negative.   There were no vitals taken for this visit.There is no height or weight on file to calculate BMI.  General Appearance: UTA  Eye Contact:  UTA  Speech:  Clear and Coherent  Volume:  Normal  Mood:  Euthymic  Affect:  UTA  Thought Process:  Goal Directed and Descriptions of Associations: Intact  Orientation:  Full (Time, Place, and Person)  Thought Content: Logical   Suicidal Thoughts:  No  Homicidal Thoughts:  No  Memory:  Immediate;   Fair Recent;   Fair Remote;   Fair  Judgement:  Fair  Insight:  Fair  Psychomotor Activity:  UTA  Concentration:  Concentration: Fair and Attention Span: Fair  Recall:  [employer] of Knowledge: Fair  Language: Fair  Akathisia:  No  Handed:  Right  AIMS (if indicated): UTA  Assets:  Communication Skills Desire for Improvement Housing Social Support  ADL's:  Intact  Cognition: WNL  Sleep:  Fair   Screenings: GAD-7   Flowsheet Row Office Visit from 01/20/2019 in [facility]  Total GAD-7 Score 3    PHQ2-9   Flowsheet  Row Office Visit from 01/20/2019 in [facility] Office Visit from 04/23/2017 in [facility]  PHQ-2 Total Score 2 0  PHQ-9 Total Score 6 --       Assessment and Plan: Courtney [name] is a 29 year old Caucasian female, lives in [place], has a history of ADHD, migraine headaches, bipolar disorder type II was evaluated by telemedicine today.  Patient is currently stable on current medication regimen.  Plan as noted below.  Plan Bipolar disorder type II in remission Lamotrigine 75 mg p.o. daily divided dosage. Continue melatonin as needed  ADHD-stable Ritalin 20 mg p.o. daily in the morning and 10 mg in the afternoon. Currently being prescribed by her primary care provider.  Follow-up in clinic in 2 to 3 months or sooner if needed.  I have spent atleast 20 minutes non face to face  with patient today. More than 50 % of the time was spent for preparing to see the patient ( e.g., review of test, records ), ordering medications and test ,psychoeducation and supportive psychotherapy and care coordination,as well as documenting clinical information in electronic health record. This note was generated in part or whole with voice recognition software. Voice recognition is usually quite accurate but there are transcription errors that can and very often do occur. I apologize for any typographical errors that were not detected and corrected.      Jomarie Longs, MD 03/03/2020, 6:16 PM

## 2020-03-22 ENCOUNTER — Encounter: Admit: 2020-03-22 | Discharge: 2020-03-23 | Payer: PRIVATE HEALTH INSURANCE

## 2020-03-22 DIAGNOSIS — L732 Hidradenitis suppurativa: Principal | ICD-10-CM

## 2020-04-11 ENCOUNTER — Ambulatory Visit: Payer: BC Managed Care – PPO | Admitting: Family Medicine

## 2020-04-12 ENCOUNTER — Other Ambulatory Visit: Payer: Self-pay

## 2020-04-12 ENCOUNTER — Ambulatory Visit (INDEPENDENT_AMBULATORY_CARE_PROVIDER_SITE_OTHER): Payer: BC Managed Care – PPO | Admitting: Family Medicine

## 2020-04-12 ENCOUNTER — Encounter: Payer: Self-pay | Admitting: Family Medicine

## 2020-04-12 VITALS — BP 94/62 | HR 103 | Temp 98.4°F | Ht 61.0 in | Wt 210.5 lb

## 2020-04-12 DIAGNOSIS — R110 Nausea: Secondary | ICD-10-CM | POA: Diagnosis not present

## 2020-04-12 DIAGNOSIS — F3176 Bipolar disorder, in full remission, most recent episode depressed: Secondary | ICD-10-CM | POA: Diagnosis not present

## 2020-04-12 DIAGNOSIS — F901 Attention-deficit hyperactivity disorder, predominantly hyperactive type: Secondary | ICD-10-CM

## 2020-04-12 MED ORDER — ONDANSETRON 4 MG PO TBDP
4.0000 mg | ORAL_TABLET | Freq: Three times a day (TID) | ORAL | 0 refills | Status: DC | PRN
Start: 2020-04-12 — End: 2020-10-07

## 2020-04-12 MED ORDER — METHYLPHENIDATE HCL 20 MG PO TABS: 20.0000 mg | ORAL_TABLET | Freq: Every day | ORAL | 0 refills | Status: DC

## 2020-04-12 NOTE — Assessment & Plan Note (Signed)
Continues to follow with psychiatry. Doing well. Cont lamotrigine 25 mg.

## 2020-04-12 NOTE — Assessment & Plan Note (Signed)
Continues to do well. Rare use of afternoon dose - no refill today. Cont Ritalin 20 mg daily and prn 10 mg afternoon

## 2020-04-12 NOTE — Patient Instructions (Signed)
Great to see you!  Refills today

## 2020-04-12 NOTE — Progress Notes (Signed)
   Subjective:     Courtney [name] is a 30 y.o. female presenting for Follow-up (3 month- ADHD ) and Medication Refill     HPI   #adhd - doing well - rare use of the afternoon ritalin   #nausea - takes whenever she has infusion - did take several when she was sick so the other prescription   Review of Systems   Social History   Tobacco Use  Smoking Status Never Smoker  Smokeless Tobacco Never Used        Objective:    BP Readings from Last 3 Encounters:  04/12/20 94/62  02/28/20 120/80  01/07/20 100/80   Wt Readings from Last 3 Encounters:  04/12/20 210 lb 8 oz (95.5 kg)  01/07/20 208 lb 4 oz (94.5 kg)  11/17/19 203 lb 8 oz (92.3 kg)    BP 94/62   Pulse (!) 103   Temp 98.4 F (36.9 C) (Temporal)   Ht 5\' 1"  (1.549 m)   Wt 210 lb 8 oz (95.5 kg)   LMP  (LMP Unknown)   SpO2 97%   BMI 39.77 kg/m    Physical Exam Constitutional:      General: She is not in acute distress.    Appearance: She is well-developed. She is not diaphoretic.  HENT:     Right Ear: External ear normal.     Left Ear: External ear normal.  Eyes:     Conjunctiva/sclera: Conjunctivae normal.  Cardiovascular:     Rate and Rhythm: Normal rate and regular rhythm.     Heart sounds: No murmur heard.   Pulmonary:     Effort: Pulmonary effort is normal. No respiratory distress.     Breath sounds: Normal breath sounds. No wheezing.  Musculoskeletal:     Cervical back: Neck supple.  Skin:    General: Skin is warm and dry.     Capillary Refill: Capillary refill takes less than 2 seconds.     Comments: Healing scar from prior hydradenitis on right neck  Neurological:     Mental Status: She is alert. Mental status is at baseline.  Psychiatric:        Mood and Affect: Mood normal.        Behavior: Behavior normal.           Assessment & Plan:   Problem List Items Addressed This Visit      Other   Attention deficit hyperactivity disorder (ADHD), predominantly hyperactive  type - Primary    Continues to do well. Rare use of afternoon dose - no refill today. Cont Ritalin 20 mg daily and prn 10 mg afternoon      Relevant Medications   methylphenidate (RITALIN) 20 MG tablet   Nausea    Typically with remicade infusion. Cont prn use of zofran      Relevant Medications   ondansetron (ZOFRAN ODT) 4 MG disintegrating tablet   Bipolar disorder, in full remission, most recent episode depressed (HCC)    Continues to follow with psychiatry. Doing well. Cont lamotrigine 25 mg.           Return in about 3 months (around 07/10/2020) for annual .  09/09/2020, MD  This visit occurred during the SARS-CoV-2 public health emergency.  Safety protocols were in place, including screening questions prior to the visit, additional usage of staff PPE, and extensive cleaning of exam room while observing appropriate contact time as indicated for disinfecting solutions.

## 2020-04-12 NOTE — Assessment & Plan Note (Signed)
Typically with remicade infusion. Cont prn use of zofran

## 2020-04-15 DIAGNOSIS — L732 Hidradenitis suppurativa: Principal | ICD-10-CM

## 2020-04-28 DIAGNOSIS — S0531XA Ocular laceration without prolapse or loss of intraocular tissue, right eye, initial encounter: Secondary | ICD-10-CM | POA: Diagnosis not present

## 2020-05-02 ENCOUNTER — Encounter: Admit: 2020-05-02 | Discharge: 2020-05-03 | Payer: PRIVATE HEALTH INSURANCE

## 2020-05-02 DIAGNOSIS — H16203 Unspecified keratoconjunctivitis, bilateral: Secondary | ICD-10-CM | POA: Diagnosis not present

## 2020-05-02 DIAGNOSIS — L732 Hidradenitis suppurativa: Secondary | ICD-10-CM | POA: Diagnosis not present

## 2020-05-03 ENCOUNTER — Telehealth (INDEPENDENT_AMBULATORY_CARE_PROVIDER_SITE_OTHER): Payer: BC Managed Care – PPO | Admitting: Psychiatry

## 2020-05-03 ENCOUNTER — Other Ambulatory Visit: Payer: Self-pay

## 2020-05-03 ENCOUNTER — Encounter: Payer: Self-pay | Admitting: Psychiatry

## 2020-05-03 DIAGNOSIS — F3181 Bipolar II disorder: Secondary | ICD-10-CM

## 2020-05-03 DIAGNOSIS — F3176 Bipolar disorder, in full remission, most recent episode depressed: Secondary | ICD-10-CM

## 2020-05-03 DIAGNOSIS — F901 Attention-deficit hyperactivity disorder, predominantly hyperactive type: Secondary | ICD-10-CM | POA: Diagnosis not present

## 2020-05-03 MED ORDER — LAMOTRIGINE 100 MG PO TABS: 50.0000 mg | ORAL_TABLET | Freq: Two times a day (BID) | ORAL | 0 refills | Status: DC

## 2020-05-03 NOTE — Progress Notes (Signed)
Virtual Visit via Video Note  I connected with Courtney [name] on 05/03/20 at  2:00 PM EST by a video enabled telemedicine application and verified that I am speaking with the correct person using two identifiers.  Location Provider Location : [facility] Patient Location : Home  Participants: Patient , Provider   I discussed the limitations of evaluation and management by telemedicine and the availability of in person appointments. The patient expressed understanding and agreed to proceed.   I discussed the assessment and treatment plan with the patient. The patient was provided an opportunity to ask questions and all were answered. The patient agreed with the plan and demonstrated an understanding of the instructions.   The patient was advised to call back or seek an in-person evaluation if the symptoms worsen or if the condition fails to improve as anticipated.   [facility] MD OP Progress Note  05/03/2020 5:32 PM OLETTA BUEHRING  MRN:  [id number]  Chief Complaint:  Chief Complaint    Follow-up     HPI: Courtney [name] is a 30 year old female, single, lives in [place], has a history of bipolar disorder type II, ADHD, migraine headaches, hidradenitis was evaluated by telemedicine today.  Patient today reports she is currently struggling with mood lability, irritability.  She reports she goes through mood swings throughout the day.  This has been getting worse since the past 1 to 2 weeks.  She reports she does not feel as depressed as before.  She denies any manic or hypomanic symptoms.  She reports sleep as good.  She denies any suicidality, homicidality or perceptual disturbances.  She does report psychosocial stressors of inability to find a job.  She has applied for several jobs and is waiting.  She also reports recent corneal abrasion while she was taking out her contact lens.  She reports that it is healing and she currently on antibiotics.  Patient denies any other concerns  today.   Visit Diagnosis:    ICD-10-CM   1. Bipolar 2 disorder, major depressive episode (HCC)  F31.81 lamoTRIgine (LAMICTAL) 100 MG tablet   mild, mixed features  2. Attention deficit hyperactivity disorder (ADHD), predominantly hyperactive type  F90.1     Past Psychiatric History: I have reviewed past psychiatric history from my progress note on 08/04/2019.  Past trials of Ritalin, Daytrana patch, Adderall, risperidone, Concerta  Past Medical History:  Past Medical History:  Diagnosis Date  . ADHD (attention deficit hyperactivity disorder)   . Chronic tonsillitis 08/2013   current strep, will finish antibiotic 09/04/2013; snores during sleep, mother denies apnea  . Family history of adverse reaction to anesthesia    " My cousin had problems waking up. "  . Heart murmur    "when I was born; outgrew it; I was a preemie"  . Hidradenitis   . Hidradenitis 2017  . Migraine 12/19/2016   "I've just had this one for 3 days" (12/20/2016)    Past Surgical History:  Procedure Laterality Date  . AXILLARY HIDRADENITIS EXCISION    . AXILLARY LYMPH NODE BIOPSY Left 01/06/2019  . CYSTOSCOPY W/ URETERAL STENT PLACEMENT Right 05/28/2017   Procedure: CYSTOSCOPY WITH RETROGRADE PYELOGRAM/ RIGHT URETERAL STENT PLACEMENT;  Surgeon: Crist Fat, MD;  Location: [facility];  Service: Urology;  Laterality: Right;  . HYDRADENITIS EXCISION Bilateral 04/03/2017   Procedure: EXCISION AND DRAINAGE BILATERAL  HIDRADENITIS AXILLA;  Surgeon: Griselda Miner, MD;  Location: [facility] OR;  Service: General;  Laterality: Bilateral;  . INCISION AND DRAINAGE  ABSCESS Right 07/20/2016   Procedure: INCISION AND DRAINAGE ABSCESS right axilla;  Surgeon: Abigail Miyamoto, MD;  Location: [facility] OR;  Service: General;  Laterality: Right;  . INTRAUTERINE DEVICE (IUD) INSERTION  11/2016   "had the one in my left arm removed"  . IRRIGATION AND DEBRIDEMENT ABSCESS Right 12/21/2016   Procedure: IRRIGATION AND DEBRIDEMENT RIGHT AXILLARY  HIDRADENITIS;  Surgeon: Andria Meuse, MD;  Location: [facility] OR;  Service: General;  Laterality: Right;  . IRRIGATION AND DEBRIDEMENT ABSCESS Left 01/08/2017   Procedure: IRRIGATION AND DEBRIDEMENT AXILLARY ABSCESS;  Surgeon: Griselda Miner, MD;  Location: [facility] OR;  Service: General;  Laterality: Left;  . NASAL SEPTOPLASTY W/ TURBINOPLASTY Bilateral 02/25/2019   Procedure: NASAL SEPTOPLASTY WITH TURBINATE REDUCTION;  Surgeon: Serena Colonel, MD;  Location: [facility];  Service: ENT;  Laterality: Bilateral;  . TONSILLECTOMY Bilateral 09/14/2013   Procedure: BILATERAL TONSILLECTOMY;  Surgeon: Serena Colonel, MD;  Location: [facility];  Service: ENT;  Laterality: Bilateral;  . TYMPANOPLASTY Bilateral    "rebuilt eardrums"  . WISDOM TOOTH EXTRACTION      Family Psychiatric History: I have reviewed family psychiatric history from my progress note on 08/04/2019  Family History:  Family History  Problem Relation Age of Onset  . Hypertension Mother   . Hyperlipidemia Mother   . Diabetes Mother   . Heart attack Father 73  . Parkinson's disease Maternal Grandmother   . Dementia Maternal Grandmother   . Diabetes Maternal Grandmother   . Bell's palsy Maternal Grandmother   . Alcohol abuse Paternal Uncle     Social History: I have reviewed social history from my progress note on 08/04/2019 Social History   Socioeconomic History  . Marital status: Single    Spouse name: Not on file  . Number of children: Not on file  . Years of education: EMS/Fire training  . Highest education level: Not on file  Occupational History  . Not on file  Tobacco Use  . Smoking status: Never Smoker  . Smokeless tobacco: Never Used  Vaping Use  . Vaping Use: Former  Substance and Sexual Activity  . Alcohol use: Yes    Comment: occasional-about 2 times a month, 1 drink  . Drug use: Not Currently    Comment: in high school  . Sexual activity: Yes    Birth control/protection: Pill,  Condom  Other Topics Concern  . Not on file  Social History Narrative   02/11/19   From: the area   Living: with Mom currently, working to find her own place   Work: EMS/Fire      Family: good relationship with Mom and Grandma      Enjoys: spend time with friends/family, hunting, bomb fire      Exercise: every shift does training - 2-3 times a week   Diet: well rounded, meat heavy      Safety   Seat belts: Yes    Guns: Yes  and secure   Safe in relationships: Yes    Helmet: no    Social Determinants of Corporate investment banker Strain: Not on file  Food Insecurity: Not on file  Transportation Needs: Not on file  Physical Activity: Not on file  Stress: Not on file  Social Connections: Not on file    Allergies:  Allergies  Allergen Reactions  . Other Hives    ABD pad causes rash Pt can only tolerate cloth tape Pt can only tolerate cloth tape  . Tapentadol  Hives and Other (See Comments)    Pt can only tolerate cloth tape  . Tape Hives and Other (See Comments)    Pt can only tolerate cloth tape  . Amoxicillin-Pot Clavulanate Other (See Comments)  . Ginger   . Morphine And Related     " I become very angry"  . Iodine Nausea Only  . Vancomycin Other (See Comments)    Burns her vein really bad and always causes them to blow.    Metabolic Disorder Labs: Lab Results  Component Value Date   HGBA1C 5.1 05/18/2019   No results found for: PROLACTIN Lab Results  Component Value Date   CHOL 207 (H) 05/18/2019   TRIG 191.0 (H) 05/18/2019   HDL 49.80 05/18/2019   CHOLHDL 4 05/18/2019   VLDL 38.2 05/18/2019   LDLCALC 119 (H) 05/18/2019   LDLCALC 116 (H) 12/03/2016   Lab Results  Component Value Date   TSH 1.32 08/05/2019   TSH 2.45 12/03/2016    Therapeutic Level Labs: No results found for: LITHIUM No results found for: VALPROATE No components found for:  CBMZ  Current Medications: Current Outpatient Medications  Medication Sig Dispense Refill  .  lamoTRIgine (LAMICTAL) 100 MG tablet Take 0.5 tablets (50 mg total) by mouth 2 (two) times daily. 30 tablet 0  . albuterol (VENTOLIN HFA) 108 (90 Base) MCG/ACT inhaler INHALE 2 PUFFS BY MOUTH EVERY 6 HOURS AS NEEDED 8.5 each 5  . clindamycin (CLEOCIN) 300 MG capsule Take 300 mg by mouth 2 (two) times daily.    . diphenhydrAMINE (BENADRYL) 25 mg capsule Take 25 mg by mouth every 6 (six) hours as needed for itching or allergies.     . drospirenone-ethinyl estradiol (YAZ) 3-0.02 MG tablet [name] (28) 3 mg-0.02 mg tablet  TAKE 1 TABLET BY MOUTH EVERY DAY    . fluconazole (DIFLUCAN) 150 MG tablet Take one tablet at the onset of symptoms. If symptoms are still present 3 days later, take the second tablet. 2 tablet 0  . ibuprofen (ADVIL) 800 MG tablet Take 1 tablet (800 mg total) by mouth 3 (three) times daily. 21 tablet 0  . inFLIXimab (REMICADE IV) Inject into the vein.    . methylphenidate (RITALIN) 10 MG tablet Take 1 tablet (10 mg total) by mouth daily as needed (afternoon ADHD symptoms). 30 tablet 0  . methylphenidate (RITALIN) 20 MG tablet Take 1 tablet (20 mg total) by mouth daily. 30 tablet 0  . [START ON 06/10/2020] methylphenidate (RITALIN) 20 MG tablet Take 1 tablet (20 mg total) by mouth daily. 30 tablet 0  . [START ON 05/10/2020] methylphenidate (RITALIN) 20 MG tablet Take 1 tablet (20 mg total) by mouth daily. 30 tablet 0  . montelukast (SINGULAIR) 10 MG tablet Take 1 tablet (10 mg total) by mouth at bedtime. 90 tablet 3  . neomycin-polymyxin b-dexamethasone (MAXITROL) 3.5-10000-0.1 SUSP     . ondansetron (ZOFRAN ODT) 4 MG disintegrating tablet Take 1 tablet (4 mg total) by mouth every 8 (eight) hours as needed for nausea or vomiting. 20 tablet 0  . Vitamin D, Ergocalciferol, (DRISDOL) 1.25 MG (50000 UNIT) CAPS capsule      No current facility-administered medications for this visit.     Musculoskeletal: Strength & Muscle Tone: UTA Gait & Station: UTA Patient leans: N/A  Psychiatric  Specialty Exam: Review of Systems  Psychiatric/Behavioral:       Mood swings  All other systems reviewed and are negative.   There were no vitals taken for this  visit.There is no height or weight on file to calculate BMI.  General Appearance: Casual  Eye Contact:  Fair  Speech:  Clear and Coherent  Volume:  Normal  Mood:  Mood swings  Affect:  Congruent  Thought Process:  Goal Directed and Descriptions of Associations: Intact  Orientation:  Full (Time, Place, and Person)  Thought Content: Logical   Suicidal Thoughts:  No  Homicidal Thoughts:  No  Memory:  Immediate;   Fair Recent;   Fair Remote;   Fair  Judgement:  Fair  Insight:  Fair  Psychomotor Activity:  Normal  Concentration:  Concentration: Fair and Attention Span: Fair  Recall:  [employer] of Knowledge: Fair  Language: Fair  Akathisia:  No  Handed:  Right  AIMS (if indicated): UTA  Assets:  Communication Skills Desire for Improvement Housing Social Support  ADL's:  Intact  Cognition: WNL  Sleep:  Fair   Screenings: GAD-7   Flowsheet Row Office Visit from 01/20/2019 in [facility]  Total GAD-7 Score 3    PHQ2-9   Flowsheet Row Video Visit from 05/03/2020 in [facility] Office Visit from 01/20/2019 in [facility] Office Visit from 04/23/2017 in [facility]  PHQ-2 Total Score 1 2 0  PHQ-9 Total Score -- 6 --    Flowsheet Row Video Visit from 05/03/2020 in [facility]  C-SSRS RISK CATEGORY No Risk       Assessment and Plan: Courtney [name] is a 30 year old Caucasian female, lives in [place], has a history of ADHD, migraine headaches, bipolar disorder type II was evaluated by telemedicine today.  Patient is currently struggling with mood lability and will benefit from the following plan.  Plan Bipolar disorder type II-unstable Increase lamotrigine to 100 mg p.o. daily in divided  dosage Continue melatonin as needed for sleep  ADHD-stable Ritalin 20 mg in the morning and 10 mg in the afternoon Currently being prescribed by primary care provider.   Follow-up in clinic in 2 to 3 weeks or sooner if needed.  I have spent atleast 20 minutes with patient today. More than 50 % of the time was spent for preparing to see the patient ( e.g., review of test, records ) , ordering medications and test ,psychoeducation and supportive psychotherapy and care coordination,as well as documenting clinical information in electronic health record. This note was generated in part or whole with voice recognition software. Voice recognition is usually quite accurate but there are transcription errors that can and very often do occur. I apologize for any typographical errors that were not detected and corrected.     Jomarie Longs, MD 05/04/2020, 9:29 AM

## 2020-05-06 DIAGNOSIS — H16203 Unspecified keratoconjunctivitis, bilateral: Secondary | ICD-10-CM | POA: Diagnosis not present

## 2020-05-08 ENCOUNTER — Other Ambulatory Visit: Payer: Self-pay | Admitting: Family Medicine

## 2020-05-20 ENCOUNTER — Telehealth (INDEPENDENT_AMBULATORY_CARE_PROVIDER_SITE_OTHER): Payer: BC Managed Care – PPO | Admitting: Psychiatry

## 2020-05-20 ENCOUNTER — Other Ambulatory Visit: Payer: Self-pay

## 2020-05-20 ENCOUNTER — Encounter: Payer: Self-pay | Admitting: Psychiatry

## 2020-05-20 DIAGNOSIS — F3181 Bipolar II disorder: Secondary | ICD-10-CM

## 2020-05-20 DIAGNOSIS — F901 Attention-deficit hyperactivity disorder, predominantly hyperactive type: Secondary | ICD-10-CM

## 2020-05-20 MED ORDER — LAMOTRIGINE 25 MG PO TABS: 25.0000 mg | ORAL_TABLET | ORAL | 0 refills | Status: DC

## 2020-05-20 NOTE — Progress Notes (Signed)
Virtual Visit via Video Note  I connected with Courtney [name] Moraes on 05/20/20 at 10:40 AM EDT by a video enabled telemedicine application and verified that I am speaking with the correct person using two identifiers.  Location Provider Location : [facility] Patient Location : Home  Participants: Patient , Provider   I discussed the limitations of evaluation and management by telemedicine and the availability of in person appointments. The patient expressed understanding and agreed to proceed    I discussed the assessment and treatment plan with the patient. The patient was provided an opportunity to ask questions and all were answered. The patient agreed with the plan and demonstrated an understanding of the instructions.   The patient was advised to call back or seek an in-person evaluation if the symptoms worsen or if the condition fails to improve as anticipated.   [facility] MD OP Progress Note  05/20/2020 1:39 PM Courtney [name] Allmendinger  MRN:  [id number]  Chief Complaint:  Chief Complaint    Follow-up; Anxiety     HPI: Courtney [name] Weyman is a 30 year old female, single, lives in [place], has a history of bipolar disorder type II, ADHD, migraine headaches, hidradenitis was evaluated by telemedicine today.  Patient today reports she has noticed an improvement in her mood.  She however continues to have irritability and mood swings 3-4 times a week.  When it happens it lasts for a day.  Patient is tolerating the Lamictal well and denies side effects.  She reports sleep is good.  She reports appetite is fair.  She reports she got a call from [facility] and has upcoming interview.  She looks forward to that.  She denies any suicidality, homicidality or perceptual disturbances.  Patient denies any other concerns today.  Visit Diagnosis:    ICD-10-CM   1. Bipolar 2 disorder, major depressive episode (HCC)  F31.81 lamoTRIgine (LAMICTAL) 25 MG tablet   mild, mixed features  2. Attention deficit  hyperactivity disorder (ADHD), predominantly hyperactive type  F90.1     Past Psychiatric History: I have reviewed past psychiatric history from my progress note on 08/04/2019.  Past trials of Ritalin, Daytrana patch, Adderall, risperidone, Concerta  Past Medical History:  Past Medical History:  Diagnosis Date  . ADHD (attention deficit hyperactivity disorder)   . Chronic tonsillitis 08/2013   current strep, will finish antibiotic 09/04/2013; snores during sleep, mother denies apnea  . Family history of adverse reaction to anesthesia    " My cousin had problems waking up. "  . Heart murmur    "when I was born; outgrew it; I was a preemie"  . Hidradenitis   . Hidradenitis 2017  . Migraine 12/19/2016   "I've just had this one for 3 days" (12/20/2016)    Past Surgical History:  Procedure Laterality Date  . AXILLARY HIDRADENITIS EXCISION    . AXILLARY LYMPH NODE BIOPSY Left 01/06/2019  . CYSTOSCOPY W/ URETERAL STENT PLACEMENT Right 05/28/2017   Procedure: CYSTOSCOPY WITH RETROGRADE PYELOGRAM/ RIGHT URETERAL STENT PLACEMENT;  Surgeon: Crist FatHerrick, Benjamin W, MD;  Location: [facility];  Service: Urology;  Laterality: Right;  . HYDRADENITIS EXCISION Bilateral 04/03/2017   Procedure: EXCISION AND DRAINAGE BILATERAL  HIDRADENITIS AXILLA;  Surgeon: Griselda Mineroth, Paul III, MD;  [facility];  Service: General;  Laterality: Bilateral;  . INCISION AND DRAINAGE ABSCESS Right 07/20/2016   Procedure: INCISION AND DRAINAGE ABSCESS right axilla;  Surgeon: Abigail MiyamotoBlackman, Douglas, MD;  [facility];  Service: General;  Laterality: Right;  . INTRAUTERINE DEVICE (IUD)  INSERTION  11/2016   "had the one in my left arm removed"  . IRRIGATION AND DEBRIDEMENT ABSCESS Right 12/21/2016   Procedure: IRRIGATION AND DEBRIDEMENT RIGHT AXILLARY HIDRADENITIS;  Surgeon: Andria Meuse, MD;  Location: [facility];  Service: General;  Laterality: Right;  . IRRIGATION AND DEBRIDEMENT ABSCESS Left 01/08/2017   Procedure: IRRIGATION AND  DEBRIDEMENT AXILLARY ABSCESS;  Surgeon: Griselda Miner, MD;  Location: [facility];  Service: General;  Laterality: Left;  . NASAL SEPTOPLASTY W/ TURBINOPLASTY Bilateral 02/25/2019   Procedure: NASAL SEPTOPLASTY WITH TURBINATE REDUCTION;  Surgeon: Serena Colonel, MD;  Location: [facility];  Service: ENT;  Laterality: Bilateral;  . TONSILLECTOMY Bilateral 09/14/2013   Procedure: BILATERAL TONSILLECTOMY;  Surgeon: Serena Colonel, MD;  Location: [facility];  Service: ENT;  Laterality: Bilateral;  . TYMPANOPLASTY Bilateral    "rebuilt eardrums"  . WISDOM TOOTH EXTRACTION      Family Psychiatric History: I have reviewed family psychiatric history from my progress note on 08/04/2019  Family History:  Family History  Problem Relation Age of Onset  . Hypertension Mother   . Hyperlipidemia Mother   . Diabetes Mother   . Heart attack Father 20  . Parkinson's disease Maternal Grandmother   . Dementia Maternal Grandmother   . Diabetes Maternal Grandmother   . Bell's palsy Maternal Grandmother   . Alcohol abuse Paternal Uncle     Social History: Reviewed social history from my progress note on 08/04/2019 Social History   Socioeconomic History  . Marital status: Single    Spouse name: Not on file  . Number of children: Not on file  . Years of education: EMS/Fire training  . Highest education level: Not on file  Occupational History  . Not on file  Tobacco Use  . Smoking status: Never Smoker  . Smokeless tobacco: Never Used  Vaping Use  . Vaping Use: Former  Substance and Sexual Activity  . Alcohol use: Yes    Comment: occasional-about 2 times a month, 1 drink  . Drug use: Not Currently    Comment: in high school  . Sexual activity: Yes    Birth control/protection: Pill, Condom  Other Topics Concern  . Not on file  Social History Narrative   02/11/19   From: the area   Living: with Mom currently, working to find her own place   Work: EMS/Fire      Family: good  relationship with Mom and Grandma      Enjoys: spend time with friends/family, hunting, bomb fire      Exercise: every shift does training - 2-3 times a week   Diet: well rounded, meat heavy      Safety   Seat belts: Yes    Guns: Yes  and secure   Safe in relationships: Yes    Helmet: no    Social Determinants of Corporate investment banker Strain: Not on file  Food Insecurity: Not on file  Transportation Needs: Not on file  Physical Activity: Not on file  Stress: Not on file  Social Connections: Not on file    Allergies:  Allergies  Allergen Reactions  . Other Hives    ABD pad causes rash Pt can only tolerate cloth tape Pt can only tolerate cloth tape  . Tapentadol Hives and Other (See Comments)    Pt can only tolerate cloth tape  . Tape Hives and Other (See Comments)    Pt can only tolerate cloth tape  . Amoxicillin-Pot  Clavulanate Other (See Comments)  . Ginger   . Morphine And Related     " I become very angry"  . Iodine Nausea Only  . Vancomycin Other (See Comments)    Burns her vein really bad and always causes them to blow.    Metabolic Disorder Labs: Lab Results  Component Value Date   HGBA1C 5.1 05/18/2019   No results found for: PROLACTIN Lab Results  Component Value Date   CHOL 207 (H) 05/18/2019   TRIG 191.0 (H) 05/18/2019   HDL 49.80 05/18/2019   CHOLHDL 4 05/18/2019   VLDL 38.2 05/18/2019   LDLCALC 119 (H) 05/18/2019   LDLCALC 116 (H) 12/03/2016   Lab Results  Component Value Date   TSH 1.32 08/05/2019   TSH 2.45 12/03/2016    Therapeutic Level Labs: No results found for: LITHIUM No results found for: VALPROATE No components found for:  CBMZ  Current Medications: Current Outpatient Medications  Medication Sig Dispense Refill  . lamoTRIgine (LAMICTAL) 25 MG tablet Take 1 tablet (25 mg total) by mouth as directed. Take daily with 50 mg in the evening 30 tablet 0  . albuterol (VENTOLIN HFA) 108 (90 Base) MCG/ACT inhaler INHALE 2  PUFFS BY MOUTH EVERY 6 HOURS AS NEEDED 8.5 each 5  . clindamycin (CLEOCIN) 300 MG capsule Take 300 mg by mouth 2 (two) times daily.    . diphenhydrAMINE (BENADRYL) 25 mg capsule Take 25 mg by mouth every 6 (six) hours as needed for itching or allergies.     . drospirenone-ethinyl estradiol (YAZ) 3-0.02 MG tablet [name] (28) 3 mg-0.02 mg tablet  TAKE 1 TABLET BY MOUTH EVERY DAY    . fluconazole (DIFLUCAN) 150 MG tablet Take one tablet at the onset of symptoms. If symptoms are still present 3 days later, take the second tablet. 2 tablet 0  . ibuprofen (ADVIL) 800 MG tablet Take 1 tablet (800 mg total) by mouth 3 (three) times daily. 21 tablet 0  . inFLIXimab (REMICADE IV) Inject into the vein.    Marland Kitchen lamoTRIgine (LAMICTAL) 100 MG tablet Take 0.5 tablets (50 mg total) by mouth 2 (two) times daily. 30 tablet 0  . methylphenidate (RITALIN) 10 MG tablet Take 1 tablet (10 mg total) by mouth daily as needed (afternoon ADHD symptoms). 30 tablet 0  . methylphenidate (RITALIN) 20 MG tablet Take 1 tablet (20 mg total) by mouth daily. 30 tablet 0  . [START ON 06/10/2020] methylphenidate (RITALIN) 20 MG tablet Take 1 tablet (20 mg total) by mouth daily. 30 tablet 0  . methylphenidate (RITALIN) 20 MG tablet Take 1 tablet (20 mg total) by mouth daily. 30 tablet 0  . montelukast (SINGULAIR) 10 MG tablet TAKE 1 TABLET BY MOUTH EVERYDAY AT BEDTIME 90 tablet 3  . neomycin-polymyxin b-dexamethasone (MAXITROL) 3.5-10000-0.1 SUSP     . ondansetron (ZOFRAN ODT) 4 MG disintegrating tablet Take 1 tablet (4 mg total) by mouth every 8 (eight) hours as needed for nausea or vomiting. 20 tablet 0  . Vitamin D, Ergocalciferol, (DRISDOL) 1.25 MG (50000 UNIT) CAPS capsule      No current facility-administered medications for this visit.     Musculoskeletal: Strength & Muscle Tone: UTA Gait & Station: normal Patient leans: N/A  Psychiatric Specialty Exam: Review of Systems  Psychiatric/Behavioral:       Mood swings  All  other systems reviewed and are negative.   There were no vitals taken for this visit.There is no height or weight on file to calculate BMI.  General Appearance: Casual  Eye Contact:  Fair  Speech:  Clear and Coherent  Volume:  Normal  Mood:  Mood swings  Affect:  Appropriate  Thought Process:  Goal Directed and Descriptions of Associations: Intact  Orientation:  Full (Time, Place, and Person)  Thought Content: WDL   Suicidal Thoughts:  No  Homicidal Thoughts:  No  Memory:  Immediate;   Fair Recent;   Fair Remote;   Fair  Judgement:  Fair  Insight:  Fair  Psychomotor Activity:  Normal  Concentration:  Concentration: Fair and Attention Span: Fair  Recall:  [employer] of Knowledge: Fair  Language: Fair  Akathisia:  No  Handed:  Right  AIMS (if indicated): UTA  Assets:  Communication Skills Desire for Improvement Housing Social Support  ADL's:  Intact  Cognition: WNL  Sleep:  Fair   Screenings: GAD-7   Flowsheet Row Office Visit from 01/20/2019 in [facility]  Total GAD-7 Score 3    PHQ2-9   Flowsheet Row Video Visit from 05/03/2020 in [facility] Office Visit from 01/20/2019 in [facility] Office Visit from 04/23/2017 in [facility]  PHQ-2 Total Score 1 2 0  PHQ-9 Total Score -- 6 --    Flowsheet Row Video Visit from 05/03/2020 in [facility]  C-SSRS RISK CATEGORY No Risk       Assessment and Plan: SIEARRA AMBERG is a 30 year old Caucasian female, lives in [place], has a history of ADHD, migraine headaches, bipolar disorder type II was evaluated by telemedicine today.  Patient is currently making some progress however will benefit from continued medication management.  Plan as noted below.  Plan Bipolar disorder type II-improving Increase lamotrigine to 125 mg p.o. daily divided dosage Continue melatonin as needed for  sleep  ADHD-stable Ritalin 20 mg in the morning and 10 mg in the afternoon   Follow-up in clinic in 2 to 3 weeks or sooner if needed.  This note was generated in part or whole with voice recognition software. Voice recognition is usually quite accurate but there are transcription errors that can and very often do occur. I apologize for any typographical errors that were not detected and corrected.        Jomarie Longs, MD 05/20/2020, 1:39 PM

## 2020-05-22 ENCOUNTER — Other Ambulatory Visit: Payer: Self-pay

## 2020-05-22 ENCOUNTER — Ambulatory Visit
Admission: RE | Admit: 2020-05-22 | Discharge: 2020-05-22 | Disposition: A | Discharge status: Home / Self Care | Payer: BC Managed Care – PPO | Source: Ambulatory Visit | Attending: Family Medicine | Admitting: Family Medicine

## 2020-05-22 VITALS — BP 134/85 | HR 114 | Temp 98.0°F | Resp 18

## 2020-05-22 DIAGNOSIS — R21 Rash and other nonspecific skin eruption: Secondary | ICD-10-CM

## 2020-05-22 DIAGNOSIS — T7840XA Allergy, unspecified, initial encounter: Secondary | ICD-10-CM

## 2020-05-22 MED ORDER — DEXAMETHASONE SODIUM PHOSPHATE 10 MG/ML IJ SOLN
10.0000 mg | Freq: Once | INTRAMUSCULAR | Status: AC
  Administered 2020-05-22: 10 mg via INTRAMUSCULAR

## 2020-05-22 MED ORDER — TRIAMCINOLONE ACETONIDE 0.025 % EX OINT: 1.0000 "application " | TOPICAL_OINTMENT | Freq: Two times a day (BID) | CUTANEOUS | 0 refills | Status: DC

## 2020-05-22 NOTE — ED Provider Notes (Signed)
[facility]    CSN: [id number] Arrival date & time: 05/22/20  1031      History   Chief Complaint No chief complaint on file.   HPI Courtney [name] is a 30 y.o. female.   HPI  Patient presents today with for evaluation of pruritic rash localized to her back and upper arms.  She reports noticing pruritic rash  x 2 days ago after her Lamictal dose was increase. She is also currently receiving biologic therapy for hidradenitis.  Both medications have been longstanding prescription for patient greater than a year.  Denies ever noticing on similar pattern rash eruption previously.  She is free of fever and denies  recent illness.  No recent changes to diet or cosmetics. Past Medical History:  Diagnosis Date  . ADHD (attention deficit hyperactivity disorder)   . Chronic tonsillitis 08/2013   current strep, will finish antibiotic 09/04/2013; snores during sleep, mother denies apnea  . Family history of adverse reaction to anesthesia    " My cousin had problems waking up. "  . Heart murmur    "when I was born; outgrew it; I was a preemie"  . Hidradenitis   . Hidradenitis 2017  . Migraine 12/19/2016   "I've just had this one for 3 days" (12/20/2016)    Patient Active Problem List   Diagnosis Date Noted  . Bipolar disorder, in full remission, most recent episode depressed (HCC) 03/03/2020  . Nausea 11/17/2019  . At risk for long QT syndrome 08/04/2019  . Obesity (BMI 35.0-39.9 without comorbidity) 03/19/2019  . Reactive airway disease 02/11/2019  . Ear itch 11/04/2018  . Conductive hearing loss 03/13/2018  . Axillary hidradenitis suppurativa 04/03/2017  . Vitamin D deficiency 10/18/2014  . Attention deficit hyperactivity disorder (ADHD), predominantly hyperactive type 02/11/2012    Past Surgical History:  Procedure Laterality Date  . AXILLARY HIDRADENITIS EXCISION    . AXILLARY LYMPH NODE BIOPSY Left 01/06/2019  . CYSTOSCOPY W/ URETERAL STENT PLACEMENT Right  05/28/2017   Procedure: CYSTOSCOPY WITH RETROGRADE PYELOGRAM/ RIGHT URETERAL STENT PLACEMENT;  Surgeon: Crist Fat, MD;  Location: [facility];  Service: Urology;  Laterality: Right;  . HYDRADENITIS EXCISION Bilateral 04/03/2017   Procedure: EXCISION AND DRAINAGE BILATERAL  HIDRADENITIS AXILLA;  Surgeon: Griselda Miner, MD;  Location: [facility] OR;  Service: General;  Laterality: Bilateral;  . INCISION AND DRAINAGE ABSCESS Right 07/20/2016   Procedure: INCISION AND DRAINAGE ABSCESS right axilla;  Surgeon: Abigail Miyamoto, MD;  Location: [facility] OR;  Service: General;  Laterality: Right;  . INTRAUTERINE DEVICE (IUD) INSERTION  11/2016   "had the one in my left arm removed"  . IRRIGATION AND DEBRIDEMENT ABSCESS Right 12/21/2016   Procedure: IRRIGATION AND DEBRIDEMENT RIGHT AXILLARY HIDRADENITIS;  Surgeon: Andria Meuse, MD;  Location: [facility];  Service: General;  Laterality: Right;  . IRRIGATION AND DEBRIDEMENT ABSCESS Left 01/08/2017   Procedure: IRRIGATION AND DEBRIDEMENT AXILLARY ABSCESS;  Surgeon: Griselda Miner, MD;  Location: [facility] OR;  Service: General;  Laterality: Left;  . NASAL SEPTOPLASTY W/ TURBINOPLASTY Bilateral 02/25/2019   Procedure: NASAL SEPTOPLASTY WITH TURBINATE REDUCTION;  Surgeon: Serena Colonel, MD;  Location: [facility];  Service: ENT;  Laterality: Bilateral;  . TONSILLECTOMY Bilateral 09/14/2013   Procedure: BILATERAL TONSILLECTOMY;  Surgeon: Serena Colonel, MD;  Location: [facility];  Service: ENT;  Laterality: Bilateral;  . TYMPANOPLASTY Bilateral    "rebuilt eardrums"  . WISDOM TOOTH EXTRACTION      OB History  No obstetric history on file.      Home Medications    Prior to Admission medications   Medication Sig Start Date End Date Taking? Authorizing Provider  albuterol (VENTOLIN HFA) 108 (90 Base) MCG/ACT inhaler INHALE 2 PUFFS BY MOUTH EVERY 6 HOURS AS NEEDED 11/23/19   Lynnda Childody, Jessica R, MD  clindamycin (CLEOCIN) 300 MG capsule Take 300 mg  by mouth 2 (two) times daily. 05/04/19   [provider]  diphenhydrAMINE (BENADRYL) 25 mg capsule Take 25 mg by mouth every 6 (six) hours as needed for itching or allergies.     [provider]  drospirenone-ethinyl estradiol (YAZ) 3-0.02 MG tablet [name] (28) 3 mg-0.02 mg tablet  TAKE 1 TABLET BY MOUTH EVERY DAY    [provider]  fluconazole (DIFLUCAN) 150 MG tablet Take one tablet at the onset of symptoms. If symptoms are still present 3 days later, take the second tablet. 02/28/20   Moshe CiproMatthews, Stephanie, NP  ibuprofen (ADVIL) 800 MG tablet Take 1 tablet (800 mg total) by mouth 3 (three) times daily. 08/09/19   Darr, Gerilyn PilgrimJacob, PA-C  inFLIXimab (REMICADE IV) Inject into the vein.    [provider]  lamoTRIgine (LAMICTAL) 100 MG tablet Take 0.5 tablets (50 mg total) by mouth 2 (two) times daily. 05/03/20   Jomarie LongsEappen, Saramma, MD  lamoTRIgine (LAMICTAL) 25 MG tablet Take 1 tablet (25 mg total) by mouth as directed. Take daily with 50 mg in the evening 05/20/20   Jomarie LongsEappen, Saramma, MD  methylphenidate (RITALIN) 10 MG tablet Take 1 tablet (10 mg total) by mouth daily as needed (afternoon ADHD symptoms). 11/10/19   Lynnda Childody, Jessica R, MD  methylphenidate (RITALIN) 20 MG tablet Take 1 tablet (20 mg total) by mouth daily. 04/12/20   Lynnda Childody, Jessica R, MD  methylphenidate (RITALIN) 20 MG tablet Take 1 tablet (20 mg total) by mouth daily. 06/10/20   Lynnda Childody, Jessica R, MD  methylphenidate (RITALIN) 20 MG tablet Take 1 tablet (20 mg total) by mouth daily. 05/10/20   Lynnda Childody, Jessica R, MD  montelukast (SINGULAIR) 10 MG tablet TAKE 1 TABLET BY MOUTH EVERYDAY AT BEDTIME 05/09/20   Lynnda Childody, Jessica R, MD  neomycin-polymyxin b-dexamethasone (MAXITROL) 3.5-10000-0.1 SUSP  05/02/20   [provider]  ondansetron (ZOFRAN ODT) 4 MG disintegrating tablet Take 1 tablet (4 mg total) by mouth every 8 (eight) hours as needed for nausea or vomiting. 04/12/20   Lynnda Childody, Jessica R, MD  Vitamin D, Ergocalciferol, (DRISDOL)  1.25 MG (50000 UNIT) CAPS capsule  04/28/20   [provider]  mometasone (NASONEX) 50 MCG/ACT nasal spray Place 2 sprays into the nose daily. Patient not taking: Reported on 05/13/2018 03/25/18 12/04/18  Eustace MooreNelson, Yvonne Sue, MD    Family History Family History  Problem Relation Age of Onset  . Hypertension Mother   . Hyperlipidemia Mother   . Diabetes Mother   . Heart attack Father 8350  . Parkinson's disease Maternal Grandmother   . Dementia Maternal Grandmother   . Diabetes Maternal Grandmother   . Bell's palsy Maternal Grandmother   . Alcohol abuse Paternal Uncle     Social History Social History   Tobacco Use  . Smoking status: Never Smoker  . Smokeless tobacco: Never Used  Vaping Use  . Vaping Use: Former  Substance Use Topics  . Alcohol use: Yes    Comment: occasional-about 2 times a month, 1 drink  . Drug use: Not Currently    Comment: in high school     Allergies  Other, Tapentadol, Tape, Amoxicillin-pot clavulanate, Ginger, Morphine and related, and Vancomycin  Review of Systems Review of Systems Pertinent negatives listed in HPI  Physical Exam Triage Vital Signs ED Triage Vitals  Enc Vitals Group     BP 05/22/20 1051 134/85     Pulse Rate 05/22/20 1051 (!) 114     Resp 05/22/20 1051 18     Temp 05/22/20 1051 98 F (36.7 C)     Temp Source 05/22/20 1051 Oral     SpO2 05/22/20 1051 97 %     Weight --      Height --      Head Circumference --      Peak Flow --      Pain Score 05/22/20 1053 6     Pain Loc --      Pain Edu? --      Excl. in GC? --    No data found.  Updated Vital Signs BP 134/85 (BP Location: Right Arm)   Pulse (!) 114   Temp 98 F (36.7 C) (Oral)   Resp 18   SpO2 97%   Visual Acuity Right Eye Distance:   Left Eye Distance:   Bilateral Distance:    Right Eye Near:   Left Eye Near:    Bilateral Near:     Physical Exam HENT:     Head: Normocephalic and atraumatic.     Nose: Nose normal.  Cardiovascular:      Rate and Rhythm: Normal rate and regular rhythm.  Pulmonary:     Effort: Pulmonary effort is normal.     Breath sounds: Normal breath sounds.  Skin:    General: Skin is warm.     Capillary Refill: Capillary refill takes less than 2 seconds.     Findings: Rash present. Rash is papular.  Neurological:     General: No focal deficit present.     Mental Status: She is alert.  Psychiatric:        Mood and Affect: Mood and affect normal.        Speech: Speech normal.        Behavior: Behavior normal.        Thought Content: Thought content normal.        Cognition and Memory: Cognition normal.        Judgment: Judgment normal.    UC Treatments / Results  Labs (all labs ordered are listed, but only abnormal results are displayed) Labs Reviewed - No data to display  EKG   Radiology No results found.  Procedures Procedures (including critical care time)  Medications Ordered in UC Medications  dexamethasone (DECADRON) injection 10 mg (10 mg Intramuscular Given 05/22/20 1153)    Initial Impression / Assessment and Plan / UC Course  I have reviewed the triage vital signs and the nursing notes.  Pertinent labs & imaging results that were available during my care of the patient were reviewed by me and considered in my medical decision making (see chart for details).     Decadron 10 mg IM given in clinic. Follow-up with [facility] provider related to suspected reaction to medication dose. Triamcinolone applications BID PRN for rash and pruritic irritations. Final Clinical Impressions(s) / UC Diagnoses   Final diagnoses:  Allergic reaction, initial encounter  Rash   Discharge Instructions   None    ED Prescriptions    Medication Sig Dispense Auth. Provider   triamcinolone (KENALOG) 0.025 % ointment Apply 1 application topically 2 (two) times daily. 454  g Bing Neighbors, FNP     PDMP not reviewed this encounter.   Bing Neighbors, FNP 05/25/20 [phone]

## 2020-05-22 NOTE — ED Triage Notes (Signed)
States she has noticed a rash since her bi-polar medication has been increased.  Rash on back and buttocks.  Medication is Lamotrigine.  And was increased on Friday.  She noticed rash last night.

## 2020-05-23 ENCOUNTER — Encounter: Admit: 2020-05-23 | Discharge: 2020-05-24 | Payer: PRIVATE HEALTH INSURANCE

## 2020-05-23 DIAGNOSIS — F411 Generalized anxiety disorder: Principal | ICD-10-CM

## 2020-05-23 DIAGNOSIS — L732 Hidradenitis suppurativa: Principal | ICD-10-CM

## 2020-05-23 MED ORDER — TRIAMCINOLONE ACETONIDE 0.1 % TOPICAL OINTMENT
Freq: Two times a day (BID) | TOPICAL | 5 refills | 0.00000 days | Status: CP
Start: 2020-05-23 — End: ?

## 2020-05-23 MED ORDER — TRAMADOL 50 MG TABLET
ORAL_TABLET | Freq: Four times a day (QID) | ORAL | 0 refills | 8.00000 days | Status: CP | PRN
Start: 2020-05-23 — End: ?

## 2020-05-23 MED ORDER — DIAZEPAM 10 MG TABLET
ORAL_TABLET | ORAL | 0 refills | 0.00000 days | Status: CP
Start: 2020-05-23 — End: ?

## 2020-05-23 MED ORDER — CLINDAMYCIN HCL 300 MG CAPSULE
ORAL_CAPSULE | Freq: Two times a day (BID) | ORAL | 11 refills | 32.00000 days | Status: CP
Start: 2020-05-23 — End: ?

## 2020-05-25 DIAGNOSIS — L732 Hidradenitis suppurativa: Principal | ICD-10-CM

## 2020-06-01 ENCOUNTER — Other Ambulatory Visit: Payer: Self-pay | Admitting: Psychiatry

## 2020-06-01 ENCOUNTER — Telehealth: Payer: Self-pay

## 2020-06-01 DIAGNOSIS — F3181 Bipolar II disorder: Secondary | ICD-10-CM

## 2020-06-01 NOTE — Telephone Encounter (Signed)
Pt called states she needs a refill on the lamictal 100mg  .    lamoTRIgine (LAMICTAL) 100 MG tablet Medication Date: 05/03/2020 Department: [facility] Ordering/Authorizing: [facility], MD    Order Providers  Prescribing Provider Encounter Provider  Jomarie Longs, MD Jomarie Longs, MD   Outpatient Medication Detail   Disp Refills Start End   lamoTRIgine (LAMICTAL) 100 MG tablet 30 tablet 0 05/03/2020    Sig - Route: Take 0.5 tablets (50 mg total) by mouth 2 (two) times daily. - Oral   Sent to pharmacy as: lamoTRIgine (LAMICTAL) 100 MG tablet   E-Prescribing Status: Receipt confirmed by pharmacy (05/03/2020 2:17 PM EST)    Associated Diagnoses  Bipolar 2 disorder, major depressive episode (HCC) - Primary   mild, mixed features     Pharmacy  [id number] - 07/03/2020, [place] - [address]

## 2020-06-01 NOTE — Telephone Encounter (Signed)
Attempted to contact patient since she likely had an allergic reaction to Lamictal and was seen in the emergency department.  This medication will not be refilled prior to speaking with patient.  Left voicemail for patient to contact the office.

## 2020-06-07 ENCOUNTER — Telehealth: Payer: Self-pay

## 2020-06-07 DIAGNOSIS — F3181 Bipolar II disorder: Secondary | ICD-10-CM

## 2020-06-07 MED ORDER — LAMOTRIGINE 100 MG PO TABS: 50.0000 mg | ORAL_TABLET | Freq: Two times a day (BID) | ORAL | 1 refills | Status: DC

## 2020-06-07 NOTE — Telephone Encounter (Signed)
pt is returning your call

## 2020-06-07 NOTE — Telephone Encounter (Signed)
Returned call to patient.  Patient okay with continuing the Lamictal at a lower dosage of 100 mg.  She reports she did have a dermatology consultation and they were okay with her continuing the Lamictal.  She however does not want to stay on the Lamictal 125 mg since the higher dosage makes her tired.  She reports she was told that her skin reaction was not due to the Lamictal but likely eczema and she will continue to follow-up with dermatology.

## 2020-06-13 ENCOUNTER — Encounter: Admit: 2020-06-13 | Discharge: 2020-06-14 | Payer: PRIVATE HEALTH INSURANCE

## 2020-06-13 DIAGNOSIS — L732 Hidradenitis suppurativa: Secondary | ICD-10-CM | POA: Diagnosis not present

## 2020-06-22 ENCOUNTER — Encounter: Payer: Self-pay | Admitting: Family Medicine

## 2020-06-22 ENCOUNTER — Telehealth: Payer: BC Managed Care – PPO | Admitting: Psychiatry

## 2020-06-22 ENCOUNTER — Telehealth (INDEPENDENT_AMBULATORY_CARE_PROVIDER_SITE_OTHER): Payer: BC Managed Care – PPO | Admitting: Psychiatry

## 2020-06-22 ENCOUNTER — Encounter: Payer: Self-pay | Admitting: Psychiatry

## 2020-06-22 ENCOUNTER — Other Ambulatory Visit: Payer: Self-pay

## 2020-06-22 DIAGNOSIS — Z91013 Allergy to seafood: Secondary | ICD-10-CM

## 2020-06-22 DIAGNOSIS — F901 Attention-deficit hyperactivity disorder, predominantly hyperactive type: Secondary | ICD-10-CM | POA: Diagnosis not present

## 2020-06-22 DIAGNOSIS — F3178 Bipolar disorder, in full remission, most recent episode mixed: Secondary | ICD-10-CM

## 2020-06-22 DIAGNOSIS — M25561 Pain in right knee: Secondary | ICD-10-CM | POA: Diagnosis not present

## 2020-06-22 DIAGNOSIS — M25861 Other specified joint disorders, right knee: Secondary | ICD-10-CM | POA: Diagnosis not present

## 2020-06-22 DIAGNOSIS — G8929 Other chronic pain: Secondary | ICD-10-CM | POA: Diagnosis not present

## 2020-06-22 NOTE — Progress Notes (Signed)
Virtual Visit via Video Note  I connected with Courtney [name] on 06/22/20 at 11:30 AM EDT by a video enabled telemedicine application and verified that I am speaking with the correct person using two identifiers.  Location Provider Location : [facility] Patient Location : Home  Participants: Patient , Provider    I discussed the limitations of evaluation and management by telemedicine and the availability of in person appointments. The patient expressed understanding and agreed to proceed.  I discussed the assessment and treatment plan with the patient. The patient was provided an opportunity to ask questions and all were answered. The patient agreed with the plan and demonstrated an understanding of the instructions.   The patient was advised to call back or seek an in-person evaluation if the symptoms worsen or if the condition fails to improve as anticipated.   [facility] MD OP Progress Note  06/22/2020 11:48 AM [place]  MRN:  [id number]  Chief Complaint:  Chief Complaint    Follow-up; Depression; Anxiety     HPI: Courtney [name] is a 30 year old female, single, lives in [place], has a history of bipolar disorder type II, ADHD, migraine headaches, hidradenitis was evaluated by telemedicine today.  Patient today reports she got hired by [employer] for a part-time job.  She reports she enjoys it.  So far it has been going well.  She reports her mood symptoms are stable on the Lamictal.  She is currently taking Lamictal 100 mg only.  She denies any side effects.  She reports sleep and appetite is fair.  She denies suicidality, homicidality or perceptual disturbances.  Patient denies any other concerns today.  Visit Diagnosis:    ICD-10-CM   1. Bipolar disorder, in full remission, most recent episode mixed (HCC)  F31.78   2. Attention deficit hyperactivity disorder (ADHD), predominantly hyperactive type  F90.1     Past Psychiatric History: Reviewed past psychiatric history  from progress note on 08/04/2019.  Past trials of Ritalin, Daytrana patch, Adderall, risperidone, Concerta  Past Medical History:  Past Medical History:  Diagnosis Date  . ADHD (attention deficit hyperactivity disorder)   . Chronic tonsillitis 08/2013   current strep, will finish antibiotic 09/04/2013; snores during sleep, mother denies apnea  . Family history of adverse reaction to anesthesia    " My cousin had problems waking up. "  . Heart murmur    "when I was born; outgrew it; I was a preemie"  . Hidradenitis   . Hidradenitis 2017  . Migraine 12/19/2016   "I've just had this one for 3 days" (12/20/2016)    Past Surgical History:  Procedure Laterality Date  . AXILLARY HIDRADENITIS EXCISION    . AXILLARY LYMPH NODE BIOPSY Left 01/06/2019  . CYSTOSCOPY W/ URETERAL STENT PLACEMENT Right 05/28/2017   Procedure: CYSTOSCOPY WITH RETROGRADE PYELOGRAM/ RIGHT URETERAL STENT PLACEMENT;  Surgeon: Crist Fat, MD;  Location: [facility];  Service: Urology;  Laterality: Right;  . HYDRADENITIS EXCISION Bilateral 04/03/2017   Procedure: EXCISION AND DRAINAGE BILATERAL  HIDRADENITIS AXILLA;  Surgeon: Griselda Miner, MD;  Location: [facility] OR;  Service: General;  Laterality: Bilateral;  . INCISION AND DRAINAGE ABSCESS Right 07/20/2016   Procedure: INCISION AND DRAINAGE ABSCESS right axilla;  Surgeon: Abigail Miyamoto, MD;  Location: [facility] OR;  Service: General;  Laterality: Right;  . INTRAUTERINE DEVICE (IUD) INSERTION  11/2016   "had the one in my left arm removed"  . IRRIGATION AND DEBRIDEMENT ABSCESS Right 12/21/2016   Procedure: IRRIGATION AND  DEBRIDEMENT RIGHT AXILLARY HIDRADENITIS;  Surgeon: Andria Meuse, MD;  Location: [facility] OR;  Service: General;  Laterality: Right;  . IRRIGATION AND DEBRIDEMENT ABSCESS Left 01/08/2017   Procedure: IRRIGATION AND DEBRIDEMENT AXILLARY ABSCESS;  Surgeon: Griselda Miner, MD;  Location: [facility] OR;  Service: General;  Laterality: Left;  . NASAL SEPTOPLASTY W/  TURBINOPLASTY Bilateral 02/25/2019   Procedure: NASAL SEPTOPLASTY WITH TURBINATE REDUCTION;  Surgeon: Serena Colonel, MD;  Location: [facility];  Service: ENT;  Laterality: Bilateral;  . TONSILLECTOMY Bilateral 09/14/2013   Procedure: BILATERAL TONSILLECTOMY;  Surgeon: Serena Colonel, MD;  Location: [facility];  Service: ENT;  Laterality: Bilateral;  . TYMPANOPLASTY Bilateral    "rebuilt eardrums"  . WISDOM TOOTH EXTRACTION      Family Psychiatric History: I have reviewed family psychiatric history from progress note on 08/04/2019  Family History:  Family History  Problem Relation Age of Onset  . Hypertension Mother   . Hyperlipidemia Mother   . Diabetes Mother   . Heart attack Father 72  . Parkinson's disease Maternal Grandmother   . Dementia Maternal Grandmother   . Diabetes Maternal Grandmother   . Bell's palsy Maternal Grandmother   . Alcohol abuse Paternal Uncle     Social History: Reviewed social history from progress note on 08/04/2019 Social History   Socioeconomic History  . Marital status: Single    Spouse name: Not on file  . Number of children: Not on file  . Years of education: EMS/Fire training  . Highest education level: Not on file  Occupational History  . Not on file  Tobacco Use  . Smoking status: Never Smoker  . Smokeless tobacco: Never Used  Vaping Use  . Vaping Use: Former  Substance and Sexual Activity  . Alcohol use: Yes    Comment: occasional-about 2 times a month, 1 drink  . Drug use: Not Currently    Comment: in high school  . Sexual activity: Yes    Birth control/protection: Pill, Condom  Other Topics Concern  . Not on file  Social History Narrative   02/11/19   From: the area   Living: with Mom currently, working to find her own place   Work: EMS/Fire      Family: good relationship with Mom and Grandma      Enjoys: spend time with friends/family, hunting, bomb fire      Exercise: every shift does training -  2-3 times a week   Diet: well rounded, meat heavy      Safety   Seat belts: Yes    Guns: Yes  and secure   Safe in relationships: Yes    Helmet: no    Social Determinants of Corporate investment banker Strain: Not on file  Food Insecurity: Not on file  Transportation Needs: Not on file  Physical Activity: Not on file  Stress: Not on file  Social Connections: Not on file    Allergies:  Allergies  Allergen Reactions  . Other Hives    ABD pad causes rash Pt can only tolerate cloth tape Pt can only tolerate cloth tape  . Tapentadol Hives and Other (See Comments)    Pt can only tolerate cloth tape  . Tape Hives and Other (See Comments)    Pt can only tolerate cloth tape  . Amoxicillin-Pot Clavulanate Other (See Comments)  . Ginger   . Morphine And Related     " I become very angry"  . Vancomycin Other (See Comments)  Burns her vein really bad and always causes them to blow.    Metabolic Disorder Labs: Lab Results  Component Value Date   HGBA1C 5.1 05/18/2019   No results found for: PROLACTIN Lab Results  Component Value Date   CHOL 207 (H) 05/18/2019   TRIG 191.0 (H) 05/18/2019   HDL 49.80 05/18/2019   CHOLHDL 4 05/18/2019   VLDL 38.2 05/18/2019   LDLCALC 119 (H) 05/18/2019   LDLCALC 116 (H) 12/03/2016   Lab Results  Component Value Date   TSH 1.32 08/05/2019   TSH 2.45 12/03/2016    Therapeutic Level Labs: No results found for: LITHIUM No results found for: VALPROATE No components found for:  CBMZ  Current Medications: Current Outpatient Medications  Medication Sig Dispense Refill  . diazepam (VALIUM) 10 MG tablet Take 1 tablet 30 minutes prior to dermatology procedure    . traMADol (ULTRAM) 50 MG tablet Take by mouth.    Marland Kitchen. albuterol (VENTOLIN HFA) 108 (90 Base) MCG/ACT inhaler INHALE 2 PUFFS BY MOUTH EVERY 6 HOURS AS NEEDED 8.5 each 5  . clindamycin (CLEOCIN) 300 MG capsule Take 300 mg by mouth 2 (two) times daily.    . diphenhydrAMINE (BENADRYL)  25 mg capsule Take 25 mg by mouth every 6 (six) hours as needed for itching or allergies.     . drospirenone-ethinyl estradiol (YAZ) 3-0.02 MG tablet [name] (28) 3 mg-0.02 mg tablet  TAKE 1 TABLET BY MOUTH EVERY DAY    . fluconazole (DIFLUCAN) 150 MG tablet Take one tablet at the onset of symptoms. If symptoms are still present 3 days later, take the second tablet. 2 tablet 0  . ibuprofen (ADVIL) 800 MG tablet Take 1 tablet (800 mg total) by mouth 3 (three) times daily. 21 tablet 0  . inFLIXimab (REMICADE IV) Inject into the vein.    Marland Kitchen. lamoTRIgine (LAMICTAL) 100 MG tablet Take 0.5 tablets (50 mg total) by mouth 2 (two) times daily. 30 tablet 1  . methylphenidate (RITALIN) 10 MG tablet Take 1 tablet (10 mg total) by mouth daily as needed (afternoon ADHD symptoms). 30 tablet 0  . methylphenidate (RITALIN) 20 MG tablet Take 1 tablet (20 mg total) by mouth daily. 30 tablet 0  . methylphenidate (RITALIN) 20 MG tablet Take 1 tablet (20 mg total) by mouth daily. 30 tablet 0  . methylphenidate (RITALIN) 20 MG tablet Take 1 tablet (20 mg total) by mouth daily. 30 tablet 0  . montelukast (SINGULAIR) 10 MG tablet TAKE 1 TABLET BY MOUTH EVERYDAY AT BEDTIME 90 tablet 3  . neomycin-polymyxin b-dexamethasone (MAXITROL) 3.5-10000-0.1 SUSP     . ondansetron (ZOFRAN ODT) 4 MG disintegrating tablet Take 1 tablet (4 mg total) by mouth every 8 (eight) hours as needed for nausea or vomiting. 20 tablet 0  . triamcinolone (KENALOG) 0.025 % ointment Apply 1 application topically 2 (two) times daily. 454 g 0  . triamcinolone ointment (KENALOG) 0.1 % APPLY TO AFFECTED AREA TWICE A DAY    . Vitamin D, Ergocalciferol, (DRISDOL) 1.25 MG (50000 UNIT) CAPS capsule      No current facility-administered medications for this visit.     Musculoskeletal: Strength & Muscle Tone: UTA Gait & Station: UTA Patient leans: N/A  Psychiatric Specialty Exam: Review of Systems  Psychiatric/Behavioral: Negative for agitation, behavioral  problems, confusion, decreased concentration, dysphoric mood, hallucinations, self-injury, sleep disturbance and suicidal ideas. The patient is not nervous/anxious and is not hyperactive.   All other systems reviewed and are negative.   There were  no vitals taken for this visit.There is no height or weight on file to calculate BMI.  General Appearance: Casual  Eye Contact:  Fair  Speech:  Clear and Coherent  Volume:  Normal  Mood:  Euthymic  Affect:  Congruent  Thought Process:  Goal Directed and Descriptions of Associations: Intact  Orientation:  Full (Time, Place, and Person)  Thought Content: Logical   Suicidal Thoughts:  No  Homicidal Thoughts:  No  Memory:  Immediate;   Fair Recent;   Fair Remote;   Fair  Judgement:  Fair  Insight:  Fair  Psychomotor Activity:  Normal  Concentration:  Concentration: Fair and Attention Span: Fair  Recall:  [employer] of Knowledge: Fair  Language: Fair  Akathisia:  No  Handed:  Right  AIMS (if indicated): UTA  Assets:  Communication Skills Desire for Improvement Housing Social Support  ADL's:  Intact  Cognition: WNL  Sleep:  Fair   Screenings: GAD-7   Flowsheet Row Video Visit from 06/22/2020 in [facility] Office Visit from 01/20/2019 in [facility]  Total GAD-7 Score 4 3    PHQ2-9   Flowsheet Row Video Visit from 06/22/2020 in [facility] Video Visit from 05/03/2020 in [facility] Office Visit from 01/20/2019 in [facility] Office Visit from 04/23/2017 in [facility]  PHQ-2 Total Score 0 1 2 0  PHQ-9 Total Score -- -- 6 --    Flowsheet Row ED from 05/22/2020 in [facility] Video Visit from 05/03/2020 in [facility]  C-SSRS RISK CATEGORY No Risk No Risk       Assessment and Plan: NEMA OATLEY is a 30 year old Caucasian female,  employed, lives in [place], has a history of ADHD, migraine headaches, bipolar disorder type II was evaluated by telemedicine today.  Patient is currently stable.  Plan as noted below.  Plan Bipolar disorder type II in remission Lamictal 100 mg p.o. daily- reduced dose Melatonin as needed for sleep  ADHD-stable Ritalin 20 mg in the morning and 10 mg in the afternoon  Follow-up in clinic in 3 months or sooner if needed.  This note was generated in part or whole with voice recognition software. Voice recognition is usually quite accurate but there are transcription errors that can and very often do occur. I apologize for any typographical errors that were not detected and corrected.        Jomarie Longs, MD 06/23/2020, 10:55 AM

## 2020-06-23 MED ORDER — EPINEPHRINE 0.3 MG/0.3ML IJ SOAJ: 0.3000 mg | INTRAMUSCULAR | 0 refills | Status: DC | PRN

## 2020-07-05 ENCOUNTER — Encounter: Admit: 2020-07-05 | Discharge: 2020-07-06 | Payer: PRIVATE HEALTH INSURANCE

## 2020-07-05 DIAGNOSIS — G8918 Other acute postprocedural pain: Principal | ICD-10-CM

## 2020-07-05 DIAGNOSIS — L732 Hidradenitis suppurativa: Principal | ICD-10-CM

## 2020-07-05 DIAGNOSIS — L905 Scar conditions and fibrosis of skin: Secondary | ICD-10-CM | POA: Diagnosis not present

## 2020-07-05 MED ORDER — TRAMADOL 50 MG TABLET
ORAL_TABLET | Freq: Four times a day (QID) | ORAL | 0 refills | 4.00000 days | Status: CP | PRN
Start: 2020-07-05 — End: ?

## 2020-07-06 DIAGNOSIS — L732 Hidradenitis suppurativa: Principal | ICD-10-CM

## 2020-07-06 DIAGNOSIS — M6281 Muscle weakness (generalized): Secondary | ICD-10-CM | POA: Diagnosis not present

## 2020-07-06 DIAGNOSIS — M25561 Pain in right knee: Secondary | ICD-10-CM | POA: Diagnosis not present

## 2020-07-06 DIAGNOSIS — G8929 Other chronic pain: Secondary | ICD-10-CM | POA: Diagnosis not present

## 2020-07-06 DIAGNOSIS — M25861 Other specified joint disorders, right knee: Secondary | ICD-10-CM | POA: Diagnosis not present

## 2020-07-08 DIAGNOSIS — M25861 Other specified joint disorders, right knee: Secondary | ICD-10-CM | POA: Diagnosis not present

## 2020-07-08 DIAGNOSIS — M25561 Pain in right knee: Secondary | ICD-10-CM | POA: Diagnosis not present

## 2020-07-08 DIAGNOSIS — M6281 Muscle weakness (generalized): Secondary | ICD-10-CM | POA: Diagnosis not present

## 2020-07-08 DIAGNOSIS — G8929 Other chronic pain: Secondary | ICD-10-CM | POA: Diagnosis not present

## 2020-07-11 ENCOUNTER — Other Ambulatory Visit: Payer: Self-pay

## 2020-07-11 ENCOUNTER — Encounter: Payer: Self-pay | Admitting: Family Medicine

## 2020-07-11 ENCOUNTER — Ambulatory Visit (INDEPENDENT_AMBULATORY_CARE_PROVIDER_SITE_OTHER): Payer: BC Managed Care – PPO | Admitting: Family Medicine

## 2020-07-11 VITALS — BP 102/64 | HR 90 | Temp 98.1°F | Ht 61.0 in | Wt 210.0 lb

## 2020-07-11 DIAGNOSIS — F3181 Bipolar II disorder: Secondary | ICD-10-CM

## 2020-07-11 DIAGNOSIS — E559 Vitamin D deficiency, unspecified: Secondary | ICD-10-CM

## 2020-07-11 DIAGNOSIS — F901 Attention-deficit hyperactivity disorder, predominantly hyperactive type: Secondary | ICD-10-CM

## 2020-07-11 DIAGNOSIS — Z1159 Encounter for screening for other viral diseases: Secondary | ICD-10-CM | POA: Diagnosis not present

## 2020-07-11 DIAGNOSIS — Z0000 Encounter for general adult medical examination without abnormal findings: Secondary | ICD-10-CM | POA: Diagnosis not present

## 2020-07-11 DIAGNOSIS — E669 Obesity, unspecified: Secondary | ICD-10-CM

## 2020-07-11 DIAGNOSIS — L732 Hidradenitis suppurativa: Secondary | ICD-10-CM

## 2020-07-11 LAB — LIPID PANEL
Cholesterol: 195 mg/dL (ref 0–200)
HDL: 41.8 mg/dL (ref >39.00)
NonHDL: 152.77
Total CHOL/HDL Ratio: 5
Triglycerides: 224 mg/dL — ABNORMAL HIGH (ref 0.0–149.0)
VLDL: 44.8 mg/dL — ABNORMAL HIGH (ref 0.0–40.0)

## 2020-07-11 LAB — CBC
HCT: 36 % (ref 36.0–46.0)
Hemoglobin: 12.1 g/dL (ref 12.0–15.0)
MCHC: 33.7 g/dL (ref 30.0–36.0)
MCV: 85.7 fl (ref 78.0–100.0)
Platelets: 356 10*3/uL (ref 150.0–400.0)
RBC: 4.2 Mil/uL (ref 3.87–5.11)
RDW: 14.6 % (ref 11.5–15.5)
WBC: 12.8 10*3/uL — ABNORMAL HIGH (ref 4.0–10.5)

## 2020-07-11 LAB — COMPREHENSIVE METABOLIC PANEL
ALT: 14 U/L (ref 0–35)
AST: 16 U/L (ref 0–37)
Albumin: 3.8 g/dL (ref 3.5–5.2)
Alkaline Phosphatase: 73 U/L (ref 39–117)
BUN: 8 mg/dL (ref 6–23)
CO2: 25 mEq/L (ref 19–32)
Calcium: 9.4 mg/dL (ref 8.4–10.5)
Chloride: 102 mEq/L (ref 96–112)
Creatinine, Ser: 0.72 mg/dL (ref 0.40–1.20)
GFR: 114.46 mL/min (ref >60.00)
Glucose, Bld: 83 mg/dL (ref 70–99)
Potassium: 4.3 mEq/L (ref 3.5–5.1)
Sodium: 137 mEq/L (ref 135–145)
Total Bilirubin: 0.3 mg/dL (ref 0.2–1.2)
Total Protein: 8.1 g/dL (ref 6.0–8.3)

## 2020-07-11 LAB — LDL CHOLESTEROL, DIRECT: Direct LDL: 130 mg/dL

## 2020-07-11 LAB — VITAMIN D 25 HYDROXY (VIT D DEFICIENCY, FRACTURES): VITD: 30.97 ng/mL (ref 30.00–100.00)

## 2020-07-11 LAB — HEMOGLOBIN A1C: Hgb A1c MFr Bld: 5.6 % (ref 4.6–6.5)

## 2020-07-11 MED ORDER — METHYLPHENIDATE HCL 20 MG PO TABS: 20.0000 mg | ORAL_TABLET | Freq: Every day | ORAL | 0 refills | Status: DC

## 2020-07-11 NOTE — Progress Notes (Signed)
Annual Exam   Chief Complaint:  Chief Complaint  Patient presents with  . Follow-up    3 month for ADHD     History of Present Illness:  Ms. Courtney [name] is a 30 y.o. No obstetric history on file. who LMP was No LMP recorded. (Menstrual status: Irregular Periods)., presents today for her annual examination.    #stitches - had lesion removed - dissolvable stitches - some redness in the area - 1 week since the procedure  Nutrition/Lifestyle Diet: grilling more, healthier than before Exercise: physical therapy for PT - walking around the neighborhood She does get adequate calcium and Vitamin D in her diet.  Social History   Tobacco Use  Smoking Status Never Smoker  Smokeless Tobacco Never Used   Social History   Substance and Sexual Activity  Alcohol Use Yes   Comment: occasional-about 2 times a month, 1 drink   Social History   Substance and Sexual Activity  Drug Use Not Currently   Comment: in high school     Safety The patient wears seatbelts: yes.     The patient feels safe at home and in their relationships: yes.  General Health Dentist in the last year: Yes Eye doctor: yes  Menstrual Irregular cycles - follows with GYN  GYN She is not sexually active.     Cervical Cancer Screening (Age 70-65) Last Pap:  September 2021 Results were: no abnormalities with HPV negative  Family History of Breast Cancer: no Family History of Ovarian Cancer: no    Weight Wt Readings from Last 3 Encounters:  07/11/20 210 lb (95.3 kg)  04/12/20 210 lb 8 oz (95.5 kg)  01/07/20 208 lb 4 oz (94.5 kg)   Patient has high BMI  BMI Readings from Last 1 Encounters:  07/11/20 39.68 kg/m     Chronic disease screening Blood pressure monitoring:  BP Readings from Last 3 Encounters:  07/11/20 102/64  05/22/20 134/85  04/12/20 94/62     Lipid Monitoring: Indication for screening: age >27, obesity, diabetes, family hx, CV risk factors.  Lipid screening:  Yes  Lab Results  Component Value Date   CHOL 207 (H) 05/18/2019   HDL 49.80 05/18/2019   LDLCALC 119 (H) 05/18/2019   TRIG 191.0 (H) 05/18/2019   CHOLHDL 4 05/18/2019     Diabetes Screening: age >54, overweight, family hx, PCOS, hx of gestational diabetes, at risk ethnicity, elevated blood pressure >135/80.  Diabetes Screening screening: Not Indicated  Lab Results  Component Value Date   HGBA1C 5.1 05/18/2019      Past Medical History:  Diagnosis Date  . ADHD (attention deficit hyperactivity disorder)   . Chronic tonsillitis 08/2013   current strep, will finish antibiotic 09/04/2013; snores during sleep, mother denies apnea  . Family history of adverse reaction to anesthesia    " My cousin had problems waking up. "  . Heart murmur    "when I was born; outgrew it; I was a preemie"  . Hidradenitis   . Hidradenitis 2017  . Migraine 12/19/2016   "I've just had this one for 3 days" (12/20/2016)    Past Surgical History:  Procedure Laterality Date  . AXILLARY HIDRADENITIS EXCISION    . AXILLARY LYMPH NODE BIOPSY Left 01/06/2019  . CYSTOSCOPY W/ URETERAL STENT PLACEMENT Right 05/28/2017   Procedure: CYSTOSCOPY WITH RETROGRADE PYELOGRAM/ RIGHT URETERAL STENT PLACEMENT;  Surgeon: Ardis Hughs, MD;  Location: [facility];  Service: Urology;  Laterality: Right;  . HYDRADENITIS EXCISION Bilateral 04/03/2017  Procedure: EXCISION AND DRAINAGE BILATERAL  HIDRADENITIS AXILLA;  Surgeon: Jovita Kussmaul, MD;  Location: [place];  Service: General;  Laterality: Bilateral;  . INCISION AND DRAINAGE ABSCESS Right 07/20/2016   Procedure: INCISION AND DRAINAGE ABSCESS right axilla;  Surgeon: Coralie Keens, MD;  Location: [place];  Service: General;  Laterality: Right;  . INTRAUTERINE DEVICE (IUD) INSERTION  11/2016   "had the one in my left arm removed"  . IRRIGATION AND DEBRIDEMENT ABSCESS Right 12/21/2016   Procedure: IRRIGATION AND DEBRIDEMENT RIGHT AXILLARY HIDRADENITIS;  Surgeon: Ileana Roup, MD;  Location: [place];  Service: General;  Laterality: Right;  . IRRIGATION AND DEBRIDEMENT ABSCESS Left 01/08/2017   Procedure: IRRIGATION AND DEBRIDEMENT AXILLARY ABSCESS;  Surgeon: Jovita Kussmaul, MD;  Location: [place];  Service: General;  Laterality: Left;  . NASAL SEPTOPLASTY W/ TURBINOPLASTY Bilateral 02/25/2019   Procedure: NASAL SEPTOPLASTY WITH TURBINATE REDUCTION;  Surgeon: Izora Gala, MD;  Location: [place] [place];  Service: ENT;  Laterality: Bilateral;  . TONSILLECTOMY Bilateral 09/14/2013   Procedure: BILATERAL TONSILLECTOMY;  Surgeon: Izora Gala, MD;  Location: [place] [facility];  Service: ENT;  Laterality: Bilateral;  . TYMPANOPLASTY Bilateral    "rebuilt eardrums"  . WISDOM TOOTH EXTRACTION      Prior to Admission medications   Medication Sig Start Date End Date Taking? Authorizing Provider  albuterol (VENTOLIN HFA) 108 (90 Base) MCG/ACT inhaler INHALE 2 PUFFS BY MOUTH EVERY 6 HOURS AS NEEDED 11/23/19  Yes Lesleigh Noe, MD  clindamycin (CLEOCIN) 300 MG capsule Take 300 mg by mouth 2 (two) times daily. 05/04/19  Yes [provider]  diphenhydrAMINE (BENADRYL) 25 mg capsule Take 25 mg by mouth every 6 (six) hours as needed for itching or allergies.    Yes [provider]  drospirenone-ethinyl estradiol (YAZ) 3-0.02 MG tablet [name] (28) 3 mg-0.02 mg tablet  TAKE 1 TABLET BY MOUTH EVERY DAY   Yes [provider]  EPINEPHrine (EPIPEN 2-PAK) 0.3 mg/0.3 mL IJ SOAJ injection Inject 0.3 mg into the muscle as needed for anaphylaxis. 06/23/20  Yes Lesleigh Noe, MD  inFLIXimab (REMICADE IV) Inject into the vein.   Yes [provider]  lamoTRIgine (LAMICTAL) 100 MG tablet Take 0.5 tablets (50 mg total) by mouth 2 (two) times daily. 06/07/20  Yes Ursula Alert, MD  meloxicam (MOBIC) 15 MG tablet Take 1 tablet by mouth daily. 06/22/20  Yes [provider]  methylphenidate (RITALIN) 10 MG tablet Take 1 tablet (10 mg  total) by mouth daily as needed (afternoon ADHD symptoms). 11/10/19  Yes Lesleigh Noe, MD  methylphenidate (RITALIN) 20 MG tablet Take 1 tablet (20 mg total) by mouth daily. 06/10/20  Yes Lesleigh Noe, MD  montelukast (SINGULAIR) 10 MG tablet TAKE 1 TABLET BY MOUTH EVERYDAY AT BEDTIME 05/09/20  Yes Lesleigh Noe, MD  ondansetron (ZOFRAN ODT) 4 MG disintegrating tablet Take 1 tablet (4 mg total) by mouth every 8 (eight) hours as needed for nausea or vomiting. 04/12/20  Yes Lesleigh Noe, MD  traMADol Veatrice Bourbon) 50 MG tablet Take by mouth. 05/23/20  Yes [provider]  triamcinolone ointment (KENALOG) 0.1 % APPLY TO AFFECTED AREA TWICE A DAY 05/23/20  Yes [provider]  methylphenidate (RITALIN) 20 MG tablet Take 1 tablet (20 mg total) by mouth daily. Patient not taking: Reported on 07/11/2020 04/12/20   Lesleigh Noe, MD  methylphenidate (RITALIN) 20 MG tablet Take 1 tablet (20 mg total) by mouth daily.  Patient not taking: Reported on 07/11/2020 05/10/20   Lesleigh Noe, MD  mometasone (NASONEX) 50 MCG/ACT nasal spray Place 2 sprays into the nose daily. Patient not taking: Reported on 05/13/2018 03/25/18 12/04/18  Raylene Everts, MD    Allergies  Allergen Reactions  . Other Hives    ABD pad causes rash Pt can only tolerate cloth tape Pt can only tolerate cloth tape  . Shellfish Allergy Hives  . Tapentadol Hives and Other (See Comments)    Pt can only tolerate cloth tape  . Tape Hives and Other (See Comments)    Pt can only tolerate cloth tape  . Amoxicillin-Pot Clavulanate Other (See Comments)  . Ginger Hives  . Morphine And Related     " I become very angry"  . Vancomycin Other (See Comments)    Burns her vein really bad and always causes them to blow.    Gynecologic History: No LMP recorded. (Menstrual status: Irregular Periods).  Obstetric History: No obstetric history on file.  Social History   Socioeconomic History  . Marital status: Single    Spouse name:  Not on file  . Number of children: Not on file  . Years of education: EMS/Fire training  . Highest education level: Not on file  Occupational History  . Not on file  Tobacco Use  . Smoking status: Never Smoker  . Smokeless tobacco: Never Used  Vaping Use  . Vaping Use: Former  Substance and Sexual Activity  . Alcohol use: Yes    Comment: occasional-about 2 times a month, 1 drink  . Drug use: Not Currently    Comment: in high school  . Sexual activity: Yes    Birth control/protection: Pill, Condom  Other Topics Concern  . Not on file  Social History Narrative   02/11/19   From: the area   Living: with Mom currently, working to find her own place   Work: EMS/Fire      Family: good relationship with Mom and Grandma      Enjoys: spend time with friends/family, hunting, bomb fire      Exercise: every shift does training - 2-3 times a week   Diet: well rounded, meat heavy      Safety   Seat belts: Yes    Guns: Yes  and secure   Safe in relationships: Yes    Helmet: no    Social Determinants of Radio broadcast assistant Strain: Not on file  Food Insecurity: Not on file  Transportation Needs: Not on file  Physical Activity: Not on file  Stress: Not on file  Social Connections: Not on file  Intimate Partner Violence: Not on file    Family History  Problem Relation Age of Onset  . Hypertension Mother   . Hyperlipidemia Mother   . Diabetes Mother   . Heart attack Father 64  . Parkinson's disease Maternal Grandmother   . Dementia Maternal Grandmother   . Diabetes Maternal Grandmother   . Bell's palsy Maternal Grandmother   . Alcohol abuse Paternal Uncle     ROS   Physical Exam BP 102/64   Pulse 90   Temp 98.1 F (36.7 C) (Temporal)   Ht '5\' 1"'  (1.549 m)   Wt 210 lb (95.3 kg)   SpO2 97%   BMI 39.68 kg/m    BP Readings from Last 3 Encounters:  07/11/20 102/64  05/22/20 134/85  04/12/20 94/62    Wt Readings from Last 3 Encounters:  07/11/20 210  lb  (95.3 kg)  04/12/20 210 lb 8 oz (95.5 kg)  01/07/20 208 lb 4 oz (94.5 kg)     Physical Exam Constitutional:      General: She is not in acute distress.    Appearance: She is well-developed. She is obese. She is not diaphoretic.  HENT:     Head: Normocephalic and atraumatic.     Right Ear: External ear normal.     Left Ear: External ear normal.     Nose: Nose normal.  Eyes:     General: No scleral icterus.    Conjunctiva/sclera: Conjunctivae normal.  Cardiovascular:     Rate and Rhythm: Normal rate and regular rhythm.     Heart sounds: No murmur heard.   Pulmonary:     Effort: Pulmonary effort is normal. No respiratory distress.     Breath sounds: Normal breath sounds. No wheezing.  Abdominal:     General: Bowel sounds are normal. There is no distension.     Palpations: Abdomen is soft. There is no mass.     Tenderness: There is no abdominal tenderness. There is no guarding or rebound.  Musculoskeletal:        General: Normal range of motion.     Cervical back: Neck supple.  Lymphadenopathy:     Cervical: No cervical adenopathy.  Skin:    General: Skin is warm and dry.     Capillary Refill: Capillary refill takes less than 2 seconds.     Comments: Local erythema at site of sutures with dissolvable sutures slightly visible. No dark erythema. Some scabbing.   Neurological:     Mental Status: She is alert and oriented to person, place, and time.     Deep Tendon Reflexes: Reflexes normal.  Psychiatric:        Behavior: Behavior normal.       Results: Depression screen [facility] 2/9 01/20/2019 04/23/2017  Decreased Interest 1 0  Down, Depressed, Hopeless 1 -  PHQ - 2 Score 2 0  Altered sleeping 2 -  Tired, decreased energy 1 -  Change in appetite 1 -  Feeling bad or failure about yourself  0 -  Trouble concentrating 0 -  Moving slowly or fidgety/restless 0 -  Suicidal thoughts 0 -  PHQ-9 Score 6 -  Difficult doing work/chores Not difficult at all -  Some encounter  information is confidential and restricted. Go to Review Flowsheets activity to see all data.      Assessment: 30 y.o. No obstetric history on file. female here for routine annual examination.  Plan: Problem List Items Addressed This Visit      Musculoskeletal and Integument   Axillary hidradenitis suppurativa    Recent removal of lesion. Discussed appears to be normal local inflammation. Return to surgeon if not improving or worsening        Other   Attention deficit hyperactivity disorder (ADHD), predominantly hyperactive type    Stable doing well. Cont medication. Return 3 months      Relevant Medications   methylphenidate (RITALIN) 20 MG tablet (Start on 09/10/2020)   methylphenidate (RITALIN) 20 MG tablet (Start on 08/11/2020)   methylphenidate (RITALIN) 20 MG tablet   Vitamin D deficiency   Relevant Orders   Vitamin D, 25-hydroxy   Obesity (BMI 35.0-39.9 without comorbidity)   Relevant Medications   methylphenidate (RITALIN) 20 MG tablet (Start on 09/10/2020)   methylphenidate (RITALIN) 20 MG tablet (Start on 08/11/2020)   methylphenidate (RITALIN) 20 MG tablet   Other  Relevant Orders   Comprehensive metabolic panel   Lipid panel   Hemoglobin A1c   Bipolar 2 disorder, major depressive episode ([place])    Follows with psych but on several medications so will get screening labs today      Relevant Orders   Lipid panel   Hemoglobin A1c   CBC    Other Visit Diagnoses    Annual physical exam    -  Primary   Need for hepatitis C screening test       Relevant Orders   Hepatitis C antibody       Screening: -- Blood pressure screen normal -- cholesterol screening: will obtain -- Weight screening: obese: discussed management options, including lifestyle, dietary, and exercise. -- Diabetes Screening: will obtain -- Nutrition: encouraged healthy diet   Psych -- Depression screening (PHQ-9):  [facility] Visit from 01/20/2019 in [facility]  PHQ-9 Total Score 6       Safety -- tobacco screening: not using -- alcohol screening:  low-risk usage. -- no evidence of domestic violence or intimate partner violence.  Cancer Screening -- pap smear not collected per ASCCP guidelines -- family history of breast cancer screening: done. not at high risk.   Immunizations Immunization History  Administered Date(s) Administered  . DTaP 02/03/1993, 04/17/1993, 06/13/1993, 06/26/1994, 12/15/1996  . HPV Quadrivalent 01/03/2005, 01/30/2005, 04/30/2005  . Hepatitis B 06-11-1992, 02/03/1993, 06/13/1993  . HiB (PRP-OMP) 02/03/1993, 04/17/1993, 06/13/1993, 06/26/1994  . IPV 02/03/1993, 04/17/1993, 06/13/1993, 12/15/1996  . Influenza Nasal 12/22/2004  . Influenza,inj,Quad PF,6+ Mos 01/03/2016, 12/06/2016, 11/18/2017, 11/18/2018, 11/17/2019  . Influenza-Unspecified 12/27/2005, 12/25/2006, 11/11/2007, 12/13/2008, 12/15/2010, 11/03/2012, 02/06/2014  . MMR 12/13/1993, 06/23/1997  . Meningococcal Conjugate 11/11/2007  . PFIZER(Purple Top)SARS-COV-2 Vaccination 06/18/2019, 07/10/2019, 10/21/2019  . PPD Test 05/18/2019  . Tdap 07/28/2004, 12/06/2016  . Varicella 12/17/1995, 05/18/2019    -- flu vaccine up to date -- TDAP q10 years up to date -- HPV vaccination series (Age <26, shared decision 27-45): received -- Covid-19 Vaccine up to date  Encouraged regular vision and dental screening. Encouraged healthy exercise and diet.   Lesleigh Noe

## 2020-07-11 NOTE — Assessment & Plan Note (Signed)
Follows with psych but on several medications so will get screening labs today

## 2020-07-11 NOTE — Assessment & Plan Note (Signed)
Stable doing well. Cont medication. Return 3 months

## 2020-07-11 NOTE — Assessment & Plan Note (Signed)
Recent removal of lesion. Discussed appears to be normal local inflammation. Return to surgeon if not improving or worsening

## 2020-07-11 NOTE — Patient Instructions (Signed)
Continue medications  Continue healthy diet and exercise as tolerated

## 2020-07-12 DIAGNOSIS — M25861 Other specified joint disorders, right knee: Secondary | ICD-10-CM | POA: Diagnosis not present

## 2020-07-12 LAB — HEPATITIS C ANTIBODY
Hepatitis C Ab: NONREACTIVE
SIGNAL TO CUT-OFF: 0.06 (ref <1.00)

## 2020-07-15 ENCOUNTER — Other Ambulatory Visit: Payer: Self-pay

## 2020-07-15 ENCOUNTER — Ambulatory Visit
Admission: RE | Admit: 2020-07-15 | Discharge: 2020-07-15 | Disposition: A | Discharge status: Home / Self Care | Payer: BC Managed Care – PPO | Source: Ambulatory Visit | Attending: Emergency Medicine | Admitting: Emergency Medicine

## 2020-07-15 VITALS — BP 107/80 | HR 111 | Temp 98.4°F | Resp 16

## 2020-07-15 DIAGNOSIS — J069 Acute upper respiratory infection, unspecified: Secondary | ICD-10-CM | POA: Diagnosis not present

## 2020-07-15 DIAGNOSIS — J029 Acute pharyngitis, unspecified: Secondary | ICD-10-CM | POA: Diagnosis not present

## 2020-07-15 LAB — POCT RAPID STREP A (OFFICE): Rapid Strep A Screen: NEGATIVE

## 2020-07-15 MED ORDER — BENZONATATE 100 MG PO CAPS: 100.0000 mg | ORAL_CAPSULE | Freq: Three times a day (TID) | ORAL | 0 refills | Status: DC

## 2020-07-15 NOTE — Discharge Instructions (Signed)
Strep negative.  Culture sent.   COVID/ FLU testing ordered.  It will take between 5-7 days for test results.  Someone will contact you regarding abnormal results.    In the meantime: You should remain isolated in your home for 10 days from symptom onset AND greater than 72 hours after symptoms resolution (absence of fever without the use of fever-reducing medication and improvement in respiratory symptoms), whichever is longer Get plenty of rest and push fluids Tessalon Perles prescribed for cough Use OTC zyrtec for nasal congestion, runny nose, and/or sore throat Use OTC flonase for nasal congestion and runny nose Use medications daily for symptom relief Use OTC medications like ibuprofen or tylenol as needed fever or pain Call or go to the ED if you have any new or worsening symptoms such as fever, worsening cough, shortness of breath, chest tightness, chest pain, turning blue, changes in mental status, etc..Marland Kitchen

## 2020-07-15 NOTE — ED Triage Notes (Signed)
Triaged by provider  

## 2020-07-15 NOTE — ED Provider Notes (Signed)
[facility] CARE CENTER   [id number] 07/15/20 Arrival Time: 1041   CC: COVID symptoms  SUBJECTIVE: History from: patient.  Courtney [name] is a 30 y.o. female who presents with sore throat, fever, and cough x 2 days.  Denies sick exposure to COVID, flu or strep.  Works for EMS.  Has tried OTC medications without relief.  Reports previous symptoms in the past.   Denies fever, chills, SOB, wheezing, chest pain, nausea, changes in bowel or bladder habits.    ROS: As per HPI.  All other pertinent ROS negative.     Past Medical History:  Diagnosis Date  . ADHD (attention deficit hyperactivity disorder)   . Chronic tonsillitis 08/2013   current strep, will finish antibiotic 09/04/2013; snores during sleep, mother denies apnea  . Family history of adverse reaction to anesthesia    " My cousin had problems waking up. "  . Heart murmur    "when I was born; outgrew it; I was a preemie"  . Hidradenitis   . Hidradenitis 2017  . Migraine 12/19/2016   "I've just had this one for 3 days" (12/20/2016)   Past Surgical History:  Procedure Laterality Date  . AXILLARY HIDRADENITIS EXCISION    . AXILLARY LYMPH NODE BIOPSY Left 01/06/2019  . CYSTOSCOPY W/ URETERAL STENT PLACEMENT Right 05/28/2017   Procedure: CYSTOSCOPY WITH RETROGRADE PYELOGRAM/ RIGHT URETERAL STENT PLACEMENT;  Surgeon: Crist Fat, MD;  Location: [facility];  Service: Urology;  Laterality: Right;  . HYDRADENITIS EXCISION Bilateral 04/03/2017   Procedure: EXCISION AND DRAINAGE BILATERAL  HIDRADENITIS AXILLA;  Surgeon: Griselda Miner, MD;  Location: [facility] OR;  Service: General;  Laterality: Bilateral;  . INCISION AND DRAINAGE ABSCESS Right 07/20/2016   Procedure: INCISION AND DRAINAGE ABSCESS right axilla;  Surgeon: Abigail Miyamoto, MD;  Location: [facility] OR;  Service: General;  Laterality: Right;  . INTRAUTERINE DEVICE (IUD) INSERTION  11/2016   "had the one in my left arm removed"  . IRRIGATION AND DEBRIDEMENT ABSCESS Right 12/21/2016    Procedure: IRRIGATION AND DEBRIDEMENT RIGHT AXILLARY HIDRADENITIS;  Surgeon: Andria Meuse, MD;  Location: [facility] OR;  Service: General;  Laterality: Right;  . IRRIGATION AND DEBRIDEMENT ABSCESS Left 01/08/2017   Procedure: IRRIGATION AND DEBRIDEMENT AXILLARY ABSCESS;  Surgeon: Griselda Miner, MD;  Location: [facility] OR;  Service: General;  Laterality: Left;  . NASAL SEPTOPLASTY W/ TURBINOPLASTY Bilateral 02/25/2019   Procedure: NASAL SEPTOPLASTY WITH TURBINATE REDUCTION;  Surgeon: Serena Colonel, MD;  Location: [facility];  Service: ENT;  Laterality: Bilateral;  . TONSILLECTOMY Bilateral 09/14/2013   Procedure: BILATERAL TONSILLECTOMY;  Surgeon: Serena Colonel, MD;  Location: [facility];  Service: ENT;  Laterality: Bilateral;  . TYMPANOPLASTY Bilateral    "rebuilt eardrums"  . WISDOM TOOTH EXTRACTION     Allergies  Allergen Reactions  . Other Hives    ABD pad causes rash Pt can only tolerate cloth tape Pt can only tolerate cloth tape  . Shellfish Allergy Hives  . Tapentadol Hives and Other (See Comments)    Pt can only tolerate cloth tape  . Tape Hives and Other (See Comments)    Pt can only tolerate cloth tape  . Amoxicillin-Pot Clavulanate Other (See Comments)  . Ginger Hives  . Morphine And Related     " I become very angry"  . Vancomycin Other (See Comments)    Burns her vein really bad and always causes them to blow.   No current facility-administered medications on file prior to  encounter.   Current Outpatient Medications on File Prior to Encounter  Medication Sig Dispense Refill  . albuterol (VENTOLIN HFA) 108 (90 Base) MCG/ACT inhaler INHALE 2 PUFFS BY MOUTH EVERY 6 HOURS AS NEEDED 8.5 each 5  . clindamycin (CLEOCIN) 300 MG capsule Take 300 mg by mouth 2 (two) times daily.    . diphenhydrAMINE (BENADRYL) 25 mg capsule Take 25 mg by mouth every 6 (six) hours as needed for itching or allergies.     . drospirenone-ethinyl estradiol (YAZ) 3-0.02 MG tablet  [name] (28) 3 mg-0.02 mg tablet  TAKE 1 TABLET BY MOUTH EVERY DAY    . EPINEPHrine (EPIPEN 2-PAK) 0.3 mg/0.3 mL IJ SOAJ injection Inject 0.3 mg into the muscle as needed for anaphylaxis. 1 each 0  . inFLIXimab (REMICADE IV) Inject into the vein.    Marland Kitchen lamoTRIgine (LAMICTAL) 100 MG tablet Take 0.5 tablets (50 mg total) by mouth 2 (two) times daily. 30 tablet 1  . meloxicam (MOBIC) 15 MG tablet Take 1 tablet by mouth daily.    . methylphenidate (RITALIN) 10 MG tablet Take 1 tablet (10 mg total) by mouth daily as needed (afternoon ADHD symptoms). 30 tablet 0  . [START ON 09/10/2020] methylphenidate (RITALIN) 20 MG tablet Take 1 tablet (20 mg total) by mouth daily. 30 tablet 0  . [START ON 08/11/2020] methylphenidate (RITALIN) 20 MG tablet Take 1 tablet (20 mg total) by mouth daily. 30 tablet 0  . methylphenidate (RITALIN) 20 MG tablet Take 1 tablet (20 mg total) by mouth daily. 30 tablet 0  . montelukast (SINGULAIR) 10 MG tablet TAKE 1 TABLET BY MOUTH EVERYDAY AT BEDTIME 90 tablet 3  . ondansetron (ZOFRAN ODT) 4 MG disintegrating tablet Take 1 tablet (4 mg total) by mouth every 8 (eight) hours as needed for nausea or vomiting. 20 tablet 0  . traMADol (ULTRAM) 50 MG tablet Take by mouth.    . triamcinolone ointment (KENALOG) 0.1 % APPLY TO AFFECTED AREA TWICE A DAY    . [DISCONTINUED] mometasone (NASONEX) 50 MCG/ACT nasal spray Place 2 sprays into the nose daily. (Patient not taking: Reported on 05/13/2018) 17 g 0   Social History   Socioeconomic History  . Marital status: Single    Spouse name: Not on file  . Number of children: Not on file  . Years of education: EMS/Fire training  . Highest education level: Not on file  Occupational History  . Not on file  Tobacco Use  . Smoking status: Never Smoker  . Smokeless tobacco: Never Used  Vaping Use  . Vaping Use: Former  Substance and Sexual Activity  . Alcohol use: Yes    Comment: occasional-about 2 times a month, 1 drink  . Drug use: Not  Currently    Comment: in high school  . Sexual activity: Yes    Birth control/protection: Pill, Condom  Other Topics Concern  . Not on file  Social History Narrative   02/11/19   From: the area   Living: with Mom currently, working to find her own place   Work: EMS/Fire      Family: good relationship with Mom and Grandma      Enjoys: spend time with friends/family, hunting, bomb fire      Exercise: every shift does training - 2-3 times a week   Diet: well rounded, meat heavy      Safety   Seat belts: Yes    Guns: Yes  and secure   Safe in relationships: Yes  Helmet: no    Social Determinants of Corporate investment banker Strain: Not on file  Food Insecurity: Not on file  Transportation Needs: Not on file  Physical Activity: Not on file  Stress: Not on file  Social Connections: Not on file  Intimate Partner Violence: Not on file   Family History  Problem Relation Age of Onset  . Hypertension Mother   . Hyperlipidemia Mother   . Diabetes Mother   . Heart attack Father 38  . Parkinson's disease Maternal Grandmother   . Dementia Maternal Grandmother   . Diabetes Maternal Grandmother   . Bell's palsy Maternal Grandmother   . Alcohol abuse Paternal Uncle     OBJECTIVE:  Vitals:   07/15/20 1120  BP: 107/80  Pulse: (!) 111  Resp: 16  Temp: 98.4 F (36.9 C)  TempSrc: Oral  SpO2: 96%     General appearance: alert; appears fatigued, but nontoxic; speaking in full sentences and tolerating own secretions HEENT: NCAT; Ears: EACs clear, TMs pearly gray; Eyes: PERRL.  EOM grossly intact. Nose: nares patent without rhinorrhea, Throat: oropharynx clear, tonsils absent, uvula midline  Neck: supple without LAD Lungs: unlabored respirations, symmetrical air entry; cough: moderate; no respiratory distress; CTAB Heart: regular rate and rhythm.  Skin: warm and dry Psychological: alert and cooperative; normal mood and affect  LABS:  Results for orders placed or  performed during the hospital encounter of 07/15/20 (from the past 24 hour(s))  POCT rapid strep A     Status: None   Collection Time: 07/15/20 11:45 AM  Result Value Ref Range   Rapid Strep A Screen Negative Negative     ASSESSMENT & PLAN:  1. Sore throat   2. Viral URI with cough     Meds ordered this encounter  Medications  . benzonatate (TESSALON) 100 MG capsule    Sig: Take 1 capsule (100 mg total) by mouth every 8 (eight) hours.    Dispense:  21 capsule    Refill:  0    Order Specific Question:   Supervising Provider    Answer:   Eustace Moore [[id number]]   Strep negative.  Culture sent.   COVID/ FLU testing ordered.  It will take between 5-7 days for test results.  Someone will contact you regarding abnormal results.    In the meantime: You should remain isolated in your home for 10 days from symptom onset AND greater than 72 hours after symptoms resolution (absence of fever without the use of fever-reducing medication and improvement in respiratory symptoms), whichever is longer Get plenty of rest and push fluids Tessalon Perles prescribed for cough Use OTC zyrtec for nasal congestion, runny nose, and/or sore throat Use OTC flonase for nasal congestion and runny nose Use medications daily for symptom relief Use OTC medications like ibuprofen or tylenol as needed fever or pain Call or go to the ED if you have any new or worsening symptoms such as fever, worsening cough, shortness of breath, chest tightness, chest pain, turning blue, changes in mental status, etc...   Reviewed expectations re: course of current medical issues. Questions answered. Outlined signs and symptoms indicating need for more acute intervention. Patient verbalized understanding. After Visit Summary given.         Rennis Harding, PA-C 07/15/20 1147

## 2020-07-16 LAB — COVID-19, FLU A+B NAA
Influenza A, NAA: NOT DETECTED
Influenza B, NAA: NOT DETECTED
SARS-CoV-2, NAA: NOT DETECTED

## 2020-07-17 ENCOUNTER — Telehealth: Payer: Self-pay | Admitting: Emergency Medicine

## 2020-07-17 MED ORDER — PROMETHAZINE-DM 6.25-15 MG/5ML PO SYRP: 5.0000 mL | ORAL_SOLUTION | Freq: Four times a day (QID) | ORAL | 0 refills | Status: DC | PRN

## 2020-07-18 ENCOUNTER — Other Ambulatory Visit: Payer: Self-pay | Admitting: Psychiatry

## 2020-07-18 ENCOUNTER — Encounter: Payer: Self-pay | Admitting: Family Medicine

## 2020-07-18 DIAGNOSIS — F3181 Bipolar II disorder: Secondary | ICD-10-CM

## 2020-07-18 LAB — CULTURE, GROUP A STREP (THRC)

## 2020-07-18 NOTE — Telephone Encounter (Signed)
Dr Selena Batten, Urgent care notes in epic. Please advise. Thank you

## 2020-07-19 DIAGNOSIS — M25861 Other specified joint disorders, right knee: Secondary | ICD-10-CM | POA: Diagnosis not present

## 2020-07-22 DIAGNOSIS — M6281 Muscle weakness (generalized): Secondary | ICD-10-CM | POA: Diagnosis not present

## 2020-07-22 DIAGNOSIS — G8929 Other chronic pain: Secondary | ICD-10-CM | POA: Diagnosis not present

## 2020-07-22 DIAGNOSIS — M25561 Pain in right knee: Secondary | ICD-10-CM | POA: Diagnosis not present

## 2020-07-22 DIAGNOSIS — M25861 Other specified joint disorders, right knee: Secondary | ICD-10-CM | POA: Diagnosis not present

## 2020-07-25 ENCOUNTER — Encounter: Admit: 2020-07-25 | Discharge: 2020-07-26 | Payer: PRIVATE HEALTH INSURANCE

## 2020-07-25 DIAGNOSIS — L732 Hidradenitis suppurativa: Secondary | ICD-10-CM | POA: Diagnosis not present

## 2020-08-03 DIAGNOSIS — M6281 Muscle weakness (generalized): Secondary | ICD-10-CM | POA: Diagnosis not present

## 2020-08-03 DIAGNOSIS — M25861 Other specified joint disorders, right knee: Secondary | ICD-10-CM | POA: Diagnosis not present

## 2020-08-03 DIAGNOSIS — M25561 Pain in right knee: Secondary | ICD-10-CM | POA: Diagnosis not present

## 2020-08-03 DIAGNOSIS — G8929 Other chronic pain: Secondary | ICD-10-CM | POA: Diagnosis not present

## 2020-08-08 DIAGNOSIS — G8929 Other chronic pain: Secondary | ICD-10-CM | POA: Diagnosis not present

## 2020-08-08 DIAGNOSIS — M25861 Other specified joint disorders, right knee: Secondary | ICD-10-CM | POA: Diagnosis not present

## 2020-08-08 DIAGNOSIS — M25561 Pain in right knee: Secondary | ICD-10-CM | POA: Diagnosis not present

## 2020-08-09 ENCOUNTER — Other Ambulatory Visit (HOSPITAL_COMMUNITY): Payer: Self-pay | Admitting: Sports Medicine

## 2020-08-09 ENCOUNTER — Other Ambulatory Visit: Payer: Self-pay | Admitting: Sports Medicine

## 2020-08-09 DIAGNOSIS — G8929 Other chronic pain: Secondary | ICD-10-CM

## 2020-08-09 DIAGNOSIS — M25861 Other specified joint disorders, right knee: Secondary | ICD-10-CM

## 2020-08-10 ENCOUNTER — Other Ambulatory Visit: Payer: Self-pay | Admitting: Psychiatry

## 2020-08-10 DIAGNOSIS — M898X6 Other specified disorders of bone, lower leg: Secondary | ICD-10-CM | POA: Diagnosis not present

## 2020-08-10 DIAGNOSIS — F3181 Bipolar II disorder: Secondary | ICD-10-CM

## 2020-08-15 ENCOUNTER — Ambulatory Visit
Admission: EM | Admit: 2020-08-15 | Discharge: 2020-08-15 | Disposition: A | Discharge status: Home / Self Care | Payer: BC Managed Care – PPO | Attending: Family Medicine | Admitting: Family Medicine

## 2020-08-15 ENCOUNTER — Other Ambulatory Visit: Payer: Self-pay

## 2020-08-15 ENCOUNTER — Ambulatory Visit: Payer: Self-pay

## 2020-08-15 DIAGNOSIS — L03115 Cellulitis of right lower limb: Secondary | ICD-10-CM

## 2020-08-15 MED ORDER — DOXYCYCLINE HYCLATE 100 MG PO CAPS: 100.0000 mg | ORAL_CAPSULE | Freq: Two times a day (BID) | ORAL | 0 refills | Status: AC

## 2020-08-15 NOTE — ED Triage Notes (Signed)
Pt has rash on right lower leg that has gotten worse since last visit

## 2020-08-17 DIAGNOSIS — L732 Hidradenitis suppurativa: Principal | ICD-10-CM

## 2020-08-27 ENCOUNTER — Telehealth: Payer: BC Managed Care – PPO | Admitting: Emergency Medicine

## 2020-08-27 ENCOUNTER — Encounter: Payer: Self-pay | Admitting: Family Medicine

## 2020-08-27 DIAGNOSIS — U071 COVID-19: Secondary | ICD-10-CM

## 2020-08-27 DIAGNOSIS — R0602 Shortness of breath: Secondary | ICD-10-CM

## 2020-08-27 NOTE — Progress Notes (Signed)
Based on what you shared with me, with COVID positive and shortness of breath, I feel your condition warrants further evaluation and I recommend that you be seen in a face to face visit where your oxygen level can be checked and lungs listened to, which will help determine the best treatment for you.    NOTE: There will be NO CHARGE for this eVisit   If you are having a true medical emergency please call 911.      For an urgent face to face visit, [place] has six urgent care centers for your convenience:     [facility] at [facility] Directions [phone] [address] [place], [place] [address]    [facility] Health Urgent Care Center [facility]) Get Driving Directions [phone] [address] [place], [place] [address]  [facility] Urgent Care Center [facility]) Get Driving Directions [phone] [address] [place],  [place]  [address]  [facility] Health Urgent Care at [facility] Get Driving Directions [phone] [address], [address] [place], [place] [address]   [facility] at [facility] Get Driving Directions  [phone] [address] [place], [place] [address]   [facility] Health Urgent Care at [facility] Directions [phone] [address] [place], [place] [address]  Your MyChart E-visit questionnaire answers were reviewed by a board certified advanced clinical practitioner to complete your personal care plan based on your specific symptoms.  Thank you for using e-Visits.   Approximately 5 minutes was spent documenting and reviewing patient's chart.

## 2020-08-28 ENCOUNTER — Encounter (HOSPITAL_COMMUNITY): Payer: Self-pay | Admitting: *Deleted

## 2020-08-28 ENCOUNTER — Emergency Department (HOSPITAL_COMMUNITY): Payer: BC Managed Care – PPO

## 2020-08-28 ENCOUNTER — Emergency Department (HOSPITAL_COMMUNITY)
Admission: EM | Admit: 2020-08-28 | Discharge: 2020-08-28 | Disposition: A | Discharge status: Home / Self Care | Payer: BC Managed Care – PPO | Attending: Emergency Medicine | Admitting: Emergency Medicine

## 2020-08-28 ENCOUNTER — Other Ambulatory Visit: Payer: Self-pay

## 2020-08-28 ENCOUNTER — Ambulatory Visit
Admission: EM | Admit: 2020-08-28 | Discharge: 2020-08-28 | Disposition: A | Discharge status: Home / Self Care | Payer: BC Managed Care – PPO | Attending: Emergency Medicine | Admitting: Emergency Medicine

## 2020-08-28 DIAGNOSIS — R0789 Other chest pain: Secondary | ICD-10-CM

## 2020-08-28 DIAGNOSIS — R079 Chest pain, unspecified: Secondary | ICD-10-CM | POA: Diagnosis not present

## 2020-08-28 DIAGNOSIS — R5383 Other fatigue: Secondary | ICD-10-CM | POA: Diagnosis not present

## 2020-08-28 DIAGNOSIS — U071 COVID-19: Secondary | ICD-10-CM | POA: Diagnosis not present

## 2020-08-28 DIAGNOSIS — R0602 Shortness of breath: Secondary | ICD-10-CM | POA: Diagnosis not present

## 2020-08-28 DIAGNOSIS — R1011 Right upper quadrant pain: Secondary | ICD-10-CM | POA: Diagnosis not present

## 2020-08-28 DIAGNOSIS — R0781 Pleurodynia: Secondary | ICD-10-CM | POA: Diagnosis not present

## 2020-08-28 DIAGNOSIS — R509 Fever, unspecified: Secondary | ICD-10-CM | POA: Diagnosis not present

## 2020-08-28 HISTORY — DX: Bipolar disorder, unspecified: F31.9

## 2020-08-28 LAB — TROPONIN I (HIGH SENSITIVITY): Troponin I (High Sensitivity): <2 ng/L (ref <18)

## 2020-08-28 LAB — CBC WITH DIFFERENTIAL/PLATELET
Band Neutrophils: 16 %
Basophils Absolute: 0 10*3/uL (ref 0.0–0.1)
Basophils Relative: 0 %
Eosinophils Absolute: 0 10*3/uL (ref 0.0–0.5)
Eosinophils Relative: 0 %
HCT: 39.1 % (ref 36.0–46.0)
Hemoglobin: 12.5 g/dL (ref 12.0–15.0)
Lymphocytes Relative: 31 %
Lymphs Abs: 1.9 10*3/uL (ref 0.7–4.0)
MCH: 29 pg (ref 26.0–34.0)
MCHC: 32 g/dL (ref 30.0–36.0)
MCV: 90.7 fL (ref 80.0–100.0)
Metamyelocytes Relative: 5 %
Monocytes Absolute: 0.9 10*3/uL (ref 0.1–1.0)
Monocytes Relative: 14 %
Neutro Abs: 3.1 10*3/uL (ref 1.7–7.7)
Neutrophils Relative %: 34 %
Platelets: 333 10*3/uL (ref 150–400)
RBC: 4.31 MIL/uL (ref 3.87–5.11)
RDW: 14.2 % (ref 11.5–15.5)
WBC: 6.2 10*3/uL (ref 4.0–10.5)
nRBC: 0 % (ref 0.0–0.2)

## 2020-08-28 LAB — COMPREHENSIVE METABOLIC PANEL
ALT: 30 U/L (ref 0–44)
AST: 46 U/L — ABNORMAL HIGH (ref 15–41)
Albumin: 3.4 g/dL — ABNORMAL LOW (ref 3.5–5.0)
Alkaline Phosphatase: 66 U/L (ref 38–126)
Anion gap: 9 (ref 5–15)
BUN: 7 mg/dL (ref 6–20)
CO2: 23 mmol/L (ref 22–32)
Calcium: 8.8 mg/dL — ABNORMAL LOW (ref 8.9–10.3)
Chloride: 104 mmol/L (ref 98–111)
Creatinine, Ser: 0.8 mg/dL (ref 0.44–1.00)
GFR, Estimated: >60 mL/min (ref >60)
Glucose, Bld: 91 mg/dL (ref 70–99)
Potassium: 3.9 mmol/L (ref 3.5–5.1)
Sodium: 136 mmol/L (ref 135–145)
Total Bilirubin: 0.2 mg/dL — ABNORMAL LOW (ref 0.3–1.2)
Total Protein: 8.6 g/dL — ABNORMAL HIGH (ref 6.5–8.1)

## 2020-08-28 LAB — D-DIMER, QUANTITATIVE: D-Dimer, Quant: 0.83 ug/mL-FEU — ABNORMAL HIGH (ref 0.00–0.50)

## 2020-08-28 LAB — I-STAT BETA HCG BLOOD, ED (MC, WL, AP ONLY): I-stat hCG, quantitative: <5 m[IU]/mL (ref <5)

## 2020-08-28 MED ORDER — SODIUM CHLORIDE 0.9 % IV BOLUS
500.0000 mL | Freq: Once | INTRAVENOUS | Status: AC
  Administered 2020-08-28: 500 mL via INTRAVENOUS

## 2020-08-28 MED ORDER — ACETAMINOPHEN 500 MG PO TABS
1000.0000 mg | ORAL_TABLET | Freq: Once | ORAL | Status: AC
  Administered 2020-08-28: 1000 mg via ORAL
  Filled 2020-08-28: qty 2

## 2020-08-28 MED ORDER — IOHEXOL 350 MG/ML SOLN
100.0000 mL | Freq: Once | INTRAVENOUS | Status: AC | PRN
  Administered 2020-08-28: 100 mL via INTRAVENOUS

## 2020-08-28 MED ORDER — ONDANSETRON HCL 4 MG PO TABS: 4.0000 mg | ORAL_TABLET | Freq: Four times a day (QID) | ORAL | 0 refills | Status: DC

## 2020-08-28 NOTE — Discharge Instructions (Addendum)
I am concerned that you have a pneumonia or may be even a pulmonary embolus.  Go mmediately to the [facility] emergency department for further evaluation.  Let them know if your chest pain gets worse, for any other concerns.

## 2020-08-28 NOTE — ED Provider Notes (Signed)
HPI  SUBJECTIVE:  Courtney [name] is a 30 y.o. female who presents with 5 days of body aches, headaches, loss of sense of smell and taste, fatigue, constant substernal/central chest pressure that is getting better.  She reports pleuritic right lower lobe pain, nasal congestion, cough productive of phlegm, wheezing, sore throat, postnasal drip, nausea, sinus pain and pressure.  No fevers above 100.4, shortness of breath, vomiting, diarrhea, abdominal pain.  She has had the COVID booster.  No known COVID exposure.  She had 3 positive home COVID tests last night.  No calf pain, swelling, hemoptysis, recent surgery.  She states that she has been in bed for the past 4 to 5 days constantly up until today.  She has tried Mucinex, albuterol 2 puffs every 4-6 hours, Tylenol, tramadol.  No alleviating factors.  Symptoms are worse with lying down, walking, eating, bending forward, coughing, sitting straight up.  She has never had chest pain like this before.  She is on OCPs.  She has a history of reactive airway disease.  No history of smoking, PE, DVT, hypercoagulability.  LMP: Amenorrheic.  Denies possibility of being pregnant.  PMD: [place] at [facility]    Past Medical History:  Diagnosis Date   ADHD (attention deficit hyperactivity disorder)    Chronic tonsillitis 08/2013   current strep, will finish antibiotic 09/04/2013; snores during sleep, mother denies apnea   Family history of adverse reaction to anesthesia    " My cousin had problems waking up. "   Heart murmur    "when I was born; outgrew it; I was a preemie"   Hidradenitis    Hidradenitis 2017   Migraine 12/19/2016   "I've just had this one for 3 days" (12/20/2016)    Past Surgical History:  Procedure Laterality Date   AXILLARY HIDRADENITIS EXCISION     AXILLARY LYMPH NODE BIOPSY Left 01/06/2019   CYSTOSCOPY W/ URETERAL STENT PLACEMENT Right 05/28/2017   Procedure: CYSTOSCOPY WITH RETROGRADE PYELOGRAM/ RIGHT URETERAL STENT PLACEMENT;   Surgeon: Crist Fat, MD;  Location: [facility];  Service: Urology;  Laterality: Right;   HYDRADENITIS EXCISION Bilateral 04/03/2017   Procedure: EXCISION AND DRAINAGE BILATERAL  HIDRADENITIS AXILLA;  Surgeon: Griselda Miner, MD;  Location: [facility] OR;  Service: General;  Laterality: Bilateral;   INCISION AND DRAINAGE ABSCESS Right 07/20/2016   Procedure: INCISION AND DRAINAGE ABSCESS right axilla;  Surgeon: Abigail Miyamoto, MD;  Location: [facility] OR;  Service: General;  Laterality: Right;   INTRAUTERINE DEVICE (IUD) INSERTION  11/2016   "had the one in my left arm removed"   IRRIGATION AND DEBRIDEMENT ABSCESS Right 12/21/2016   Procedure: IRRIGATION AND DEBRIDEMENT RIGHT AXILLARY HIDRADENITIS;  Surgeon: Andria Meuse, MD;  Location: [facility] OR;  Service: General;  Laterality: Right;   IRRIGATION AND DEBRIDEMENT ABSCESS Left 01/08/2017   Procedure: IRRIGATION AND DEBRIDEMENT AXILLARY ABSCESS;  Surgeon: Griselda Miner, MD;  Location: [facility] OR;  Service: General;  Laterality: Left;   NASAL SEPTOPLASTY W/ TURBINOPLASTY Bilateral 02/25/2019   Procedure: NASAL SEPTOPLASTY WITH TURBINATE REDUCTION;  Surgeon: Serena Colonel, MD;  Location: [facility];  Service: ENT;  Laterality: Bilateral;   TONSILLECTOMY Bilateral 09/14/2013   Procedure: BILATERAL TONSILLECTOMY;  Surgeon: Serena Colonel, MD;  Location: [facility];  Service: ENT;  Laterality: Bilateral;   TYMPANOPLASTY Bilateral    "rebuilt eardrums"   WISDOM TOOTH EXTRACTION      Family History  Problem Relation Age of Onset   Hypertension Mother  Hyperlipidemia Mother    Diabetes Mother    Heart attack Father 54   Parkinson's disease Maternal Grandmother    Dementia Maternal Grandmother    Diabetes Maternal Grandmother    Bell's palsy Maternal Grandmother    Alcohol abuse Paternal Uncle     Social History   Tobacco Use   Smoking status: Never   Smokeless tobacco: Never  Vaping Use   Vaping Use: Former  Substance  Use Topics   Alcohol use: Yes    Comment: occasional-about 2 times a month, 1 drink   Drug use: Not Currently    Comment: in high school    No current facility-administered medications for this encounter.  Current Outpatient Medications:    albuterol (VENTOLIN HFA) 108 (90 Base) MCG/ACT inhaler, INHALE 2 PUFFS BY MOUTH EVERY 6 HOURS AS NEEDED, Disp: 8.5 each, Rfl: 5   diphenhydrAMINE (BENADRYL) 25 mg capsule, Take 25 mg by mouth every 6 (six) hours as needed for itching or allergies. , Disp: , Rfl:    drospirenone-ethinyl estradiol (YAZ) 3-0.02 MG tablet, [name] (28) 3 mg-0.02 mg tablet  TAKE 1 TABLET BY MOUTH EVERY DAY, Disp: , Rfl:    EPINEPHrine (EPIPEN 2-PAK) 0.3 mg/0.3 mL IJ SOAJ injection, Inject 0.3 mg into the muscle as needed for anaphylaxis., Disp: 1 each, Rfl: 0   inFLIXimab (REMICADE IV), Inject into the vein., Disp: , Rfl:    lamoTRIgine (LAMICTAL) 100 MG tablet, TAKE 0.5 TABLETS BY MOUTH 2 TIMES DAILY., Disp: 30 tablet, Rfl: 1   methylphenidate (RITALIN) 10 MG tablet, Take 1 tablet (10 mg total) by mouth daily as needed (afternoon ADHD symptoms)., Disp: 30 tablet, Rfl: 0   [START ON 09/10/2020] methylphenidate (RITALIN) 20 MG tablet, Take 1 tablet (20 mg total) by mouth daily., Disp: 30 tablet, Rfl: 0   methylphenidate (RITALIN) 20 MG tablet, Take 1 tablet (20 mg total) by mouth daily., Disp: 30 tablet, Rfl: 0   methylphenidate (RITALIN) 20 MG tablet, Take 1 tablet (20 mg total) by mouth daily., Disp: 30 tablet, Rfl: 0   montelukast (SINGULAIR) 10 MG tablet, TAKE 1 TABLET BY MOUTH EVERYDAY AT BEDTIME, Disp: 90 tablet, Rfl: 3   ondansetron (ZOFRAN ODT) 4 MG disintegrating tablet, Take 1 tablet (4 mg total) by mouth every 8 (eight) hours as needed for nausea or vomiting., Disp: 20 tablet, Rfl: 0   traMADol (ULTRAM) 50 MG tablet, Take by mouth., Disp: , Rfl:    triamcinolone ointment (KENALOG) 0.1 %, APPLY TO AFFECTED AREA TWICE A DAY, Disp: , Rfl:   Allergies  Allergen Reactions    Other Hives    ABD pad causes rash Pt can only tolerate cloth tape Pt can only tolerate cloth tape   Shellfish Allergy Hives   Tapentadol Hives and Other (See Comments)    Pt can only tolerate cloth tape   Tape Hives and Other (See Comments)    Pt can only tolerate cloth tape   Amoxicillin-Pot Clavulanate Other (See Comments)   Ginger Hives   Morphine And Related     " I become very angry"   Vancomycin Other (See Comments)    Burns her vein really bad and always causes them to blow.     ROS  As noted in HPI.   Physical Exam  BP 112/76   Pulse (!) 107   Temp 98.6 F (37 C)   Resp 20   LMP  (LMP Unknown)   SpO2 97%   Constitutional: Well developed, well nourished, no  acute distress Eyes:  EOMI, conjunctiva normal bilaterally HENT: Normocephalic, atraumatic,mucus membranes moist.  Positive nasal congestion.  Positive maxillary, frontal sinus tenderness.  Normal turbinates.  Tonsils surgically absent.  Positive postnasal drip. Neck: Positive cervical lymphadenopathy Respiratory: Normal inspiratory effort, lungs clear bilaterally, good air movement.  Positive anterior and lateral chest wall tenderness, especially in the right lower lobe. Cardiovascular: Regular tachycardia, no murmurs rubs or gallops GI: nondistended skin: No rash, skin intact Musculoskeletal: Calves symmetric, nontender, no edema. Neurologic: Alert & oriented x 3, no focal neuro deficits Psychiatric: Speech and behavior appropriate   ED Course   Medications - No data to display  No orders of the defined types were placed in this encounter.   No results found for this or any previous visit (from the past 24 hour(s)). No results found.  ED Clinical Impression  1. COVID-19 virus infection   2. Chest pain, unspecified type      ED Assessment/Plan  Patient with COVID infection, chest tightness and pleuritic right lower lobe chest pain for the past 5 days.  Symptoms may simply be from breathing  increased airway resistance as she does have anterior chest wall tenderness, but she is particularly tender in the right lower lobe.  I am concerned for pneumonia or for PE as she has additional risk factors of being on OCPs and recent immobilization.  States that she was in bed up until this morning.  She is tachycardic, and does not meet PERC rule.  Feel that she is stable to go via private vehicle.  EKG was not done as I do not think that this is an MI and it would not change management.  Discussed rationale for transfer to the emergency department.  Patient agrees with plan.  No orders of the defined types were placed in this encounter.     *This clinic note was created using Dragon dictation software. Therefore, there may be occasional mistakes despite careful proofreading.  ?    Domenick Gong, MD 08/28/20 1006

## 2020-08-28 NOTE — ED Provider Notes (Signed)
[facility] EMERGENCY DEPARTMENT Provider Note   CSN: [id number] Arrival date & time: 08/28/20  1012     History Chief Complaint  Patient presents with   Chest Pain    Courtney [name] is a 30 y.o. female.  HPI  Patient with no significant medical history presents with chief complaint of worsening cough and right-sided rib/chest pain.  Patient states she was diagnosed with COVID on Tuesday, symptoms started then, she endorses were improving but over the last few days she is having a worsening productive cough as well as having right-sided rib pain.  Patient states the rib pain is worsening with expiration, and hurts when she touches it or moves, denies trauma to the area, has never experienced in the past.  She also states that she is having chest pain, it is in the middle of her chest, constant for the last 5 days, does not radiate, not associated with lightheadedness, dizziness, worsening shortness of breath, movement does not make the pain better or worse.  She has no cardiac history, no history of PEs or DVTs, she is currently on hormone therapy.  She denies IV drug use, illicit drug use, she is a nondiabetic, does not have a history of hypertension, connective tissue disorders or Murfin disease.  Patient is not immunocompromise, has received her COVID-vaccine with boosters but was not on the antivirals.  She was seen in urgent care today where they were concerned for possible pneumonia versus PE or sent here for further evaluation.  Patient denies any alleviating factors.  Past Medical History:  Diagnosis Date   ADHD (attention deficit hyperactivity disorder)    Bipolar disorder ([place])    Chronic tonsillitis 08/2013   current strep, will finish antibiotic 09/04/2013; snores during sleep, mother denies apnea   Family history of adverse reaction to anesthesia    " My cousin had problems waking up. "   Heart murmur    "when I was born; outgrew it; I was a preemie"   Hidradenitis     Hidradenitis 2017   Migraine 12/19/2016   "I've just had this one for 3 days" (12/20/2016)    Patient Active Problem List   Diagnosis Date Noted   Bipolar disorder, in full remission, most recent episode depressed ([place]) 03/03/2020   Nausea 11/17/2019   Bipolar 2 disorder, major depressive episode ([place]) 08/04/2019   At risk for long QT syndrome 08/04/2019   Obesity (BMI 35.0-39.9 without comorbidity) 03/19/2019   Reactive airway disease 02/11/2019   Ear itch 11/04/2018   Conductive hearing loss 03/13/2018   Axillary hidradenitis suppurativa 04/03/2017   Vitamin D deficiency 10/18/2014   Attention deficit hyperactivity disorder (ADHD), predominantly hyperactive type 02/11/2012    Past Surgical History:  Procedure Laterality Date   AXILLARY HIDRADENITIS EXCISION     AXILLARY LYMPH NODE BIOPSY Left 01/06/2019   CYSTOSCOPY W/ URETERAL STENT PLACEMENT Right 05/28/2017   Procedure: CYSTOSCOPY WITH RETROGRADE PYELOGRAM/ RIGHT URETERAL STENT PLACEMENT;  Surgeon: Ardis Hughs, MD;  Location: [facility];  Service: Urology;  Laterality: Right;   HYDRADENITIS EXCISION Bilateral 04/03/2017   Procedure: EXCISION AND DRAINAGE BILATERAL  HIDRADENITIS AXILLA;  Surgeon: Jovita Kussmaul, MD;  Location: [place];  Service: General;  Laterality: Bilateral;   INCISION AND DRAINAGE ABSCESS Right 07/20/2016   Procedure: INCISION AND DRAINAGE ABSCESS right axilla;  Surgeon: Coralie Keens, MD;  Location: [place];  Service: General;  Laterality: Right;   INTRAUTERINE DEVICE (IUD) INSERTION  11/2016   "had the one in  my left arm removed"   IRRIGATION AND DEBRIDEMENT ABSCESS Right 12/21/2016   Procedure: IRRIGATION AND DEBRIDEMENT RIGHT AXILLARY HIDRADENITIS;  Surgeon: Ileana Roup, MD;  Location: [place];  Service: General;  Laterality: Right;   IRRIGATION AND DEBRIDEMENT ABSCESS Left 01/08/2017   Procedure: IRRIGATION AND DEBRIDEMENT AXILLARY ABSCESS;  Surgeon: Jovita Kussmaul, MD;  Location: [place];   Service: General;  Laterality: Left;   NASAL SEPTOPLASTY W/ TURBINOPLASTY Bilateral 02/25/2019   Procedure: NASAL SEPTOPLASTY WITH TURBINATE REDUCTION;  Surgeon: Izora Gala, MD;  Location: [place];  Service: ENT;  Laterality: Bilateral;   TONSILLECTOMY Bilateral 09/14/2013   Procedure: BILATERAL TONSILLECTOMY;  Surgeon: Izora Gala, MD;  Location: [place];  Service: ENT;  Laterality: Bilateral;   TYMPANOPLASTY Bilateral    "rebuilt eardrums"   WISDOM TOOTH EXTRACTION       OB History   No obstetric history on file.     Family History  Problem Relation Age of Onset   Hypertension Mother    Hyperlipidemia Mother    Diabetes Mother    Heart attack Father 55   Parkinson's disease Maternal Grandmother    Dementia Maternal Grandmother    Diabetes Maternal Grandmother    Bell's palsy Maternal Grandmother    Alcohol abuse Paternal Uncle     Social History   Tobacco Use   Smoking status: Never   Smokeless tobacco: Never  Vaping Use   Vaping Use: Former  Substance Use Topics   Alcohol use: Not Currently   Drug use: Not Currently    Comment: in high school    Home Medications Prior to Admission medications   Medication Sig Start Date End Date Taking? Authorizing Provider  ondansetron (ZOFRAN) 4 MG tablet Take 1 tablet (4 mg total) by mouth every 6 (six) hours. 08/28/20  Yes Marcello Fennel, PA-C  albuterol (VENTOLIN HFA) 108 (90 Base) MCG/ACT inhaler INHALE 2 PUFFS BY MOUTH EVERY 6 HOURS AS NEEDED 11/23/19   Lesleigh Noe, MD  diphenhydrAMINE (BENADRYL) 25 mg capsule Take 25 mg by mouth every 6 (six) hours as needed for itching or allergies.     [provider]  drospirenone-ethinyl estradiol (YAZ) 3-0.02 MG tablet [name] (28) 3 mg-0.02 mg tablet  TAKE 1 TABLET BY MOUTH EVERY DAY    [provider]  EPINEPHrine (EPIPEN 2-PAK) 0.3 mg/0.3 mL IJ SOAJ injection Inject 0.3 mg into the muscle as needed for anaphylaxis. 06/23/20    Lesleigh Noe, MD  inFLIXimab (REMICADE IV) Inject into the vein.    [provider]  lamoTRIgine (LAMICTAL) 100 MG tablet TAKE 0.5 TABLETS BY MOUTH 2 TIMES DAILY. 08/10/20   Ursula Alert, MD  methylphenidate (RITALIN) 10 MG tablet Take 1 tablet (10 mg total) by mouth daily as needed (afternoon ADHD symptoms). 11/10/19   Lesleigh Noe, MD  methylphenidate (RITALIN) 20 MG tablet Take 1 tablet (20 mg total) by mouth daily. 09/10/20   Lesleigh Noe, MD  methylphenidate (RITALIN) 20 MG tablet Take 1 tablet (20 mg total) by mouth daily. 08/11/20   Lesleigh Noe, MD  methylphenidate (RITALIN) 20 MG tablet Take 1 tablet (20 mg total) by mouth daily. 07/11/20   Lesleigh Noe, MD  montelukast (SINGULAIR) 10 MG tablet TAKE 1 TABLET BY MOUTH EVERYDAY AT BEDTIME 05/09/20   Lesleigh Noe, MD  ondansetron (ZOFRAN ODT) 4 MG disintegrating tablet Take 1 tablet (4 mg total) by mouth every 8 (eight) hours as needed for  nausea or vomiting. 04/12/20   Lesleigh Noe, MD  traMADol Veatrice Bourbon) 50 MG tablet Take by mouth. 05/23/20   [provider]  triamcinolone ointment (KENALOG) 0.1 % APPLY TO AFFECTED AREA TWICE A DAY 05/23/20   [provider]  mometasone (NASONEX) 50 MCG/ACT nasal spray Place 2 sprays into the nose daily. Patient not taking: Reported on 05/13/2018 03/25/18 12/04/18  Raylene Everts, MD    Allergies    Other, Shellfish allergy, Tapentadol, Tape, Amoxicillin-pot clavulanate, Ginger, Morphine and related, and Vancomycin  Review of Systems   Review of Systems  Constitutional:  Positive for fatigue. Negative for chills and fever.  HENT:  Positive for congestion. Negative for sore throat.   Respiratory:  Positive for cough. Negative for shortness of breath.   Cardiovascular:  Positive for chest pain. Negative for palpitations.  Gastrointestinal:  Positive for diarrhea. Negative for abdominal pain, nausea and vomiting.  Genitourinary:  Negative for dysuria and enuresis.   Musculoskeletal:  Negative for back pain.       Right-sided rib pain.  Skin:  Negative for rash.  Neurological:  Positive for headaches. Negative for dizziness.  Hematological:  Does not bruise/bleed easily.   Physical Exam Updated Vital Signs BP 97/65   Pulse 82   Temp 98.4 F (36.9 C) (Oral)   Resp 11   Ht '5\' 1"'  (1.549 m)   Wt 87.1 kg   LMP  (LMP Unknown)   SpO2 99%   BMI 36.28 kg/m   Physical Exam Vitals and nursing note reviewed.  Constitutional:      General: She is not in acute distress.    Appearance: She is not ill-appearing.  HENT:     Head: Normocephalic and atraumatic.     Right Ear: Tympanic membrane normal.     Left Ear: Tympanic membrane normal.     Nose: No congestion.     Mouth/Throat:     Mouth: Mucous membranes are moist.     Pharynx: Oropharynx is clear.  Eyes:     Conjunctiva/sclera: Conjunctivae normal.  Cardiovascular:     Rate and Rhythm: Normal rate and regular rhythm.     Pulses: Normal pulses.     Heart sounds: No murmur heard.   No friction rub. No gallop.  Pulmonary:     Effort: No respiratory distress.     Breath sounds: No wheezing, rhonchi or rales.  Abdominal:     Palpations: Abdomen is soft.     Tenderness: There is abdominal tenderness. There is no right CVA tenderness or left CVA tenderness.     Comments: Abdomen was visualized nondistended, normal bowel sounds, dull to percussion, she was slightly tender to palpation in her right upper quadrant, no guarding, rebound tenderness, peritoneal sign.  Musculoskeletal:     Right lower leg: No edema.     Left lower leg: No edema.     Comments: Patient's chest was palpated she had tenderness along her sternum, reproducing her consistent chest pain, chest was pain along her right ribs mid axillary 6 and 7, no gross deformities present.   Skin:    General: Skin is warm and dry.  Neurological:     Mental Status: She is alert.  Psychiatric:        Mood and Affect: Mood normal.    ED  Results / Procedures / Treatments   Labs (all labs ordered are listed, but only abnormal results are displayed) Labs Reviewed  COMPREHENSIVE METABOLIC PANEL - Abnormal; Notable for the  following components:      Result Value   Calcium 8.8 (*)    Total Protein 8.6 (*)    Albumin 3.4 (*)    AST 46 (*)    Total Bilirubin 0.2 (*)    All other components within normal limits  D-DIMER, QUANTITATIVE - Abnormal; Notable for the following components:   D-Dimer, Quant 0.83 (*)    All other components within normal limits  CBC WITH DIFFERENTIAL/PLATELET  I-STAT BETA HCG BLOOD, ED ([facility], [facility], [facility] ONLY)  TROPONIN I (HIGH SENSITIVITY)    EKG EKG Interpretation  Date/Time:  Sunday August 28 2020 10:28:07 EDT Ventricular Rate:  96 PR Interval:  116 QRS Duration: 84 QT Interval:  343 QTC Calculation: 434 R Axis:   31 Text Interpretation: Sinus rhythm Borderline short PR interval Low voltage, precordial leads Borderline T abnormalities, anterior leads NSR Confirmed by Lavenia Atlas [id number]) on 08/28/2020 12:28:22 PM  Radiology CT Angio Chest PE W and/or Wo Contrast  Result Date: 08/28/2020 CLINICAL DATA:  Pt c/o heaviness on her chest, SOB, fatigue, cough, fever, nausea and body aches since last Tuesday. Denies vomiting. Pt was at Urgent Care today because of the chest pain. Pt has had 2 home test that showed she was positive Covid in the last few days EXAM: CT ANGIOGRAPHY CHEST WITH CONTRAST TECHNIQUE: Multidetector CT imaging of the chest was performed using the standard protocol during bolus administration of intravenous contrast. Multiplanar CT image reconstructions and MIPs were obtained to evaluate the vascular anatomy. CONTRAST:  1107m OMNIPAQUE IOHEXOL 350 MG/ML SOLN COMPARISON:  Current chest radiograph. FINDINGS: Cardiovascular: Pulmonary arteries are well opacified. There is no evidence of a pulmonary embolism. Heart normal in size. No pericardial effusion. Normal great vessels.  Mediastinum/Nodes: No enlarged mediastinal, hilar, or axillary lymph nodes. Thyroid gland, trachea, and esophagus demonstrate no significant findings. Lungs/Pleura: Lungs are clear. No pleural effusion or pneumothorax. Upper Abdomen: Unremarkable. Musculoskeletal: No chest wall abnormality. No acute or significant osseous findings. Prominent axillary lymph nodes, greater on the right, largest 1 cm short axis, all presumed reactive. Review of the MIP images confirms the above findings. IMPRESSION: 1. Negative exam. No evidence of a pulmonary embolism. Clear lungs. Electronically Signed   By: DLajean ManesM.D.   On: 08/28/2020 15:07   DG Chest Port 1 View  Result Date: 08/28/2020 CLINICAL DATA:  Short of breath and some chest tightness for 4 days. Tested positive for COVID-19 last night. EXAM: PORTABLE CHEST 1 VIEW COMPARISON:  04/16/2018. FINDINGS: Cardiac silhouette normal in size. Normal mediastinal and hilar contours. Clear lungs.  No pleural effusion or pneumothorax. Skeletal structures are grossly intact. IMPRESSION: No active disease. Electronically Signed   By: DLajean ManesM.D.   On: 08/28/2020 11:45    Procedures Procedures   Medications Ordered in ED Medications  acetaminophen (TYLENOL) tablet 1,000 mg (1,000 mg Oral Given 08/28/20 1315)  sodium chloride 0.9 % bolus 500 mL (500 mLs Intravenous New Bag/Given 08/28/20 1328)  iohexol (OMNIPAQUE) 350 MG/ML injection 100 mL (100 mLs Intravenous Contrast Given 08/28/20 1445)    ED Course  I have reviewed the triage vital signs and the nursing notes.  Pertinent labs & imaging results that were available during my care of the patient were reviewed by me and considered in my medical decision making (see chart for details).    MDM Rules/Calculators/A&P  Initial impression-patient presents with worsening cough and right-sided rib pain.  She is alert, does not appear in acute stress, vital signs reassuring.  I suspect  muscular strain from coughing causing her to have pain in her chest and ribs but will order basic lab work-up, add on D-dimer as she is increased risk for blood clots.   Work-up-CBC unremarkable, CMP shows slight elevation AST 46.  D-dimer elevated 0.83, chest x-ray unremarkable.  EKG sinus rhythm without signs of ischemia CTA of the chest, CT of the chest negative for PE.  Reassessment-D-dimer is elevated will proceed with CTA for concern of possible PE.  Patient was notified that she is agreement with this plan.  Patient states she is in pain, urine pregnancy has not come back yet we will provide patient with Tylenol and reassess.  Patient was updated on lab and imaging, she states she feels much better, has no complaints this time, vitals remained stable.  Patient is agreement with discharge.  Rule out- I have low suspicion for ACS as history is atypical, patient has no cardiac history, EKG was sinus rhythm without signs of ischemia, first troponin was 2, will defer second troponin as patient been having consistent chest pain for last 5 days, would expect elevation in troponin if ACS was present.  Low suspicion for PE CT of chest is negative. low suspicion for AAA or aortic dissection as history is atypical, patient has low risk factors.  Low suspicion for systemic infection as patient is nontoxic-appearing, vital signs reassuring, no obvious source infection noted on exam.  Low suspicion for pneumonia as chest x-ray is negative, lung sounds are clear bilaterally.  Low suspicion for liver or gallbladder abnormality as there is no elevation in liver enzymes, alk phos, T bili, pain has spontaneously resolved.   Plan-  Right-sided rib pain resolved-suspect patient suffering from muscular strain from coughing and COVID infection.  Will recommend continuing with over-the-counter medication follow-up with PCP as needed.  Vital signs have remained stable, no indication for hospital admission.  Patient  given at home care as well strict return precautions.  Patient verbalized that they understood agreed to said plan.  Final Clinical Impression(s) / ED Diagnoses Final diagnoses:  Atypical chest pain    Rx / DC Orders ED Discharge Orders          Ordered    ondansetron (ZOFRAN) 4 MG tablet  Every 6 hours        08/28/20 1538             Marcello Fennel, PA-C 08/28/20 1541    Horton, Alvin Critchley, DO 08/28/20 1600

## 2020-08-28 NOTE — Discharge Instructions (Addendum)
Lab work and imaging were reassuring.  Given you a prescription for Zofran please use as needed for nausea.  I recommend taking Tylenol for fever control and ibuprofen for pain control please follow dosing on the back of bottle.  I recommend staying hydrated and if you do not an appetite, I recommend soups as this will provide you with fluids and calories.    you are Covid positive you must self quarantine for 5 days starting on symptom onset, if at the end of those 5 days you are feeling better you may return back to school/work, if you continue to have symptoms you must self quarantine for additional 5 days.  I would like you to contact "post Covid care" as they will provide you with information how to manage your Covid symptoms if you are Covid positive.    Come back to the emergency department if you develop chest pain, shortness of breath, severe abdominal pain, uncontrolled nausea, vomiting, diarrhea.

## 2020-08-28 NOTE — ED Triage Notes (Signed)
Pt presents with some sob and chest tightness , has had 3 positive covid test , symptoms began on Tuesday

## 2020-08-28 NOTE — ED Triage Notes (Addendum)
Pt c/o heaviness on her chest, SOB, fatigue, cough, fever, nausea and body aches since last Tuesday. Denies vomiting. Pt was at Urgent Care today because of the chest pain. Urgent Care Provider reported pt was tender on palpation to her right chest under the breast and SOB, therefore they were concerned for possible PE. Pt has had 2 home test that showed she was positive Covid in the last few days, but pt is concerned it may not be correct because the tests had been out in the heat.

## 2020-08-29 ENCOUNTER — Other Ambulatory Visit: Payer: Self-pay | Admitting: Family

## 2020-08-29 MED ORDER — FLUCONAZOLE 150 MG PO TABS: 150.0000 mg | ORAL_TABLET | Freq: Once | ORAL | 0 refills | Status: AC

## 2020-09-06 ENCOUNTER — Encounter: Admit: 2020-09-06 | Discharge: 2020-09-07 | Payer: PRIVATE HEALTH INSURANCE

## 2020-09-06 DIAGNOSIS — L732 Hidradenitis suppurativa: Secondary | ICD-10-CM | POA: Diagnosis not present

## 2020-09-08 DIAGNOSIS — M25561 Pain in right knee: Secondary | ICD-10-CM | POA: Diagnosis not present

## 2020-09-08 DIAGNOSIS — G8929 Other chronic pain: Secondary | ICD-10-CM | POA: Diagnosis not present

## 2020-09-08 DIAGNOSIS — M25861 Other specified joint disorders, right knee: Secondary | ICD-10-CM | POA: Diagnosis not present

## 2020-09-08 DIAGNOSIS — M25461 Effusion, right knee: Secondary | ICD-10-CM | POA: Diagnosis not present

## 2020-09-13 ENCOUNTER — Ambulatory Visit: Payer: BC Managed Care – PPO | Admitting: Psychiatry

## 2020-09-13 ENCOUNTER — Encounter: Admit: 2020-09-13 | Discharge: 2020-09-14 | Payer: PRIVATE HEALTH INSURANCE

## 2020-09-13 DIAGNOSIS — L732 Hidradenitis suppurativa: Secondary | ICD-10-CM | POA: Diagnosis not present

## 2020-09-13 MED ORDER — HYDROCODONE 5 MG-ACETAMINOPHEN 325 MG TABLET
ORAL_TABLET | ORAL | 0 refills | 0.00000 days | Status: CP
Start: 2020-09-13 — End: ?

## 2020-09-13 MED ORDER — AMOXICILLIN 875 MG-POTASSIUM CLAVULANATE 125 MG TABLET
ORAL_TABLET | 2 refills | 0.00000 days | Status: CP
Start: 2020-09-13 — End: ?

## 2020-09-27 DIAGNOSIS — L732 Hidradenitis suppurativa: Principal | ICD-10-CM

## 2020-09-27 DIAGNOSIS — G8929 Other chronic pain: Secondary | ICD-10-CM | POA: Diagnosis not present

## 2020-09-27 DIAGNOSIS — M25461 Effusion, right knee: Secondary | ICD-10-CM | POA: Diagnosis not present

## 2020-09-27 DIAGNOSIS — M25561 Pain in right knee: Secondary | ICD-10-CM | POA: Diagnosis not present

## 2020-09-27 DIAGNOSIS — M25861 Other specified joint disorders, right knee: Secondary | ICD-10-CM | POA: Diagnosis not present

## 2020-10-06 ENCOUNTER — Other Ambulatory Visit: Payer: Self-pay | Admitting: Psychiatry

## 2020-10-06 DIAGNOSIS — F3181 Bipolar II disorder: Secondary | ICD-10-CM

## 2020-10-07 ENCOUNTER — Ambulatory Visit (INDEPENDENT_AMBULATORY_CARE_PROVIDER_SITE_OTHER): Payer: BC Managed Care – PPO | Admitting: Psychiatry

## 2020-10-07 ENCOUNTER — Other Ambulatory Visit: Payer: Self-pay

## 2020-10-07 ENCOUNTER — Encounter: Payer: Self-pay | Admitting: Psychiatry

## 2020-10-07 VITALS — BP 118/76 | HR 98 | Temp 97.3°F | Wt 206.0 lb

## 2020-10-07 DIAGNOSIS — F3181 Bipolar II disorder: Secondary | ICD-10-CM

## 2020-10-07 DIAGNOSIS — F3178 Bipolar disorder, in full remission, most recent episode mixed: Secondary | ICD-10-CM | POA: Insufficient documentation

## 2020-10-07 DIAGNOSIS — F901 Attention-deficit hyperactivity disorder, predominantly hyperactive type: Secondary | ICD-10-CM | POA: Diagnosis not present

## 2020-10-07 MED ORDER — ARIPIPRAZOLE 2 MG PO TABS: 2.0000 mg | ORAL_TABLET | Freq: Every day | ORAL | 1 refills | Status: DC
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
## 2020-10-07 NOTE — Patient Instructions (Signed)
Aripiprazole Tablets What is this medication? ARIPIPRAZOLE (ay ri PIP ray zole) treats schizophrenia, bipolar I disorder, autism spectrum disorder, and Tourette disorder. It may also be used with antidepressant medications to treat depression. It works by balancing the levels of dopamine and serotonin in the brain, hormones that help regulate mood, behaviors, and thoughts. It belongs to a group of medications called antipsychotics. Antipsychotics can be used to treat several kinds of mentalhealth conditions. This medicine may be used for other purposes; ask your health care provider orpharmacist if you have questions. COMMON BRAND NAME(S): Abilify What should I tell my care team before I take this medication? They need to know if you have any of these conditions: Dementia Diabetes Difficulty swallowing Have trouble controlling your muscles Have urges you are unable to control (for example, gambling, spending money, or eating) Heart disease History of irregular heartbeat History of stroke Low blood counts, like low white cell, platelet, or red cell counts Low blood pressure Parkinson disease Seizures Suicidal thoughts, plans or attempt; a previous suicide attempt by you or a family member An unusual or allergic reaction to aripiprazole, other medications, foods, dyes, or preservatives Pregnant or trying to get pregnant Breast-feeding How should I use this medication? Take this medication by mouth with a glass of water. Follow the directions on the prescription label. You can take this medication with or without food. Take your doses at regular intervals. Do not take your medication more often thandirected. Do not stop taking except on the advice of your care team. A special MedGuide will be given to you by the pharmacist with eachprescription and refill. Be sure to read this information carefully each time. Talk to your care team regarding the use of this medication in children. While this  medication may be prescribed for children as young as 6 years of age forselected conditions, precautions do apply. Overdosage: If you think you have taken too much of this medicine contact apoison control center or emergency room at once. NOTE: This medicine is only for you. Do not share this medicine with others. What if I miss a dose? If you miss a dose, take it as soon as you can. If it is almost time for yournext dose, take only that dose. Do not take double or extra doses. What may interact with this medication? Do not take this medication with any of the following: Brexpiprazole Cisapride Dextromethorphan; quinidine Dronedarone Metoclopramide Pimozide Quinidine Thioridazine This medication may also interact with the following: Antihistamines for allergy, cough, and cold Carbamazepine Certain medications for anxiety or sleep Certain medications for depression like amitriptyline, fluoxetine, paroxetine, sertraline Certain medications for fungal infections like fluconazole, itraconazole, ketoconazole, posaconazole, voriconazole Clarithromycin General anesthetics like halothane, isoflurane, methoxyflurane, propofol Levodopa or other medications for Parkinson's disease Medications for blood pressure Medications for seizures Medications that relax muscles for surgery Narcotic medications for pain Other medications that prolong the QT interval (cause an abnormal heart rhythm) Phenothiazines like chlorpromazine, prochlorperazine Rifampin This list may not describe all possible interactions. Give your health care provider a list of all the medicines, herbs, non-prescription drugs, or dietary supplements you use. Also tell them if you smoke, drink alcohol, or use illegaldrugs. Some items may interact with your medicine. What should I watch for while using this medication? Visit your care team for regular checks on your progress. Tell your care team if symptoms do not start to get better  or if they get worse. Do not stop taking except on your care team's advice.   You may develop a severe reaction. Your careteam will tell you how much medication to take. Patients and their families should watch out for new or worsening depression or thoughts of suicide. Also watch out for sudden changes in feelings such as feeling anxious, agitated, panicky, irritable, hostile, aggressive, impulsive, severely restless, overly excited and hyperactive, or not being able to sleep. If this happens, especially at the beginning of antidepressant treatment orafter a change in dose, call your care team. You may get dizzy or drowsy. Do not drive, use machinery, or do anything that needs mental alertness until you know how this medication affects you. Do not stand or sit up quickly, especially if you are an older patient. This reduces the risk of dizzy or fainting spells. Alcohol may interfere with the effect ofthis medication. Avoid alcoholic drinks. This medication can cause problems with controlling your body temperature. It can lower the response of your body to cold temperatures. If possible, stay indoors during cold weather. If you must go outdoors, wear warm clothes. It can also lower the response of your body to heat. Do not overheat. Do not over-exercise. Stay out of the sun when possible. If you must be in the sun, wear cool clothing. Drink plenty of water. If you have trouble controlling yourbody temperature, call your care team right away. This medication may cause dry eyes and blurred vision. If you wear contact lenses, you may feel some discomfort. Lubricating drops may help. See your eyecare specialist if the problem does not go away or is severe. This medication may increase blood sugar. Ask your care team if changes in dietor medications are needed if you have diabetes. There have been reports of increased sexual urges or other strong urges such as gambling while taking this medication. If you experience  any of these while taking this medication, you should report this to your care team as soon aspossible. What side effects may I notice from receiving this medication? Side effects that you should report to your care team as soon as possible: Allergic reactions-skin rash, itching, hives, swelling of the face, lips, tongue, or throat High blood sugar (hyperglycemia)-increased thirst or amount of urine, unusual weakness or fatigue, blurry vision High fever, stiff muscles, increased sweating, fast or irregular heartbeat, and confusion, which may be signs of neuroleptic malignant syndrome Low blood pressure-dizziness, feeling faint or lightheaded, blurry vision Pain or trouble swallowing Prolonged or painful erection Seizures Stroke-sudden numbness or weakness of the face, arm, or leg, trouble speaking, confusion, trouble walking, loss of balance or coordination, dizziness, severe headache, change in vision Uncontrolled and repetitive movements of the face, mouth, or upper body, which may be signs of tardive dyskinesia (TD) Thoughts of suicide or self-harm, worsening mood, feelings of depression Urges to engage in impulsive behaviors such as gambling, binge eating, sexual activity, or shopping in ways that are unusual for you Side effects that usually do not require medical attention (report these toyour care team if they continue or are bothersome): Constipation Drowsiness Weight gain This list may not describe all possible side effects. Call your doctor for medical advice about side effects. You may report side effects to FDA at1-800-FDA-1088. Where should I keep my medication? Keep out of the reach of children and pets. Store at room temperature between 15 and 30 degrees C (59 and 86 degrees F).Throw away any unused medication after the expiration date. NOTE: This sheet is a summary. It may not cover all possible information. If you have questions about   this medicine, talk to your doctor,  pharmacist, orhealth care provider.  2022 Elsevier/Gold Standard (2020-03-09 15:17:04)  

## 2020-10-11 ENCOUNTER — Ambulatory Visit: Payer: BC Managed Care – PPO | Admitting: Family Medicine

## 2020-10-13 ENCOUNTER — Ambulatory Visit: Payer: BC Managed Care – PPO | Admitting: Family Medicine

## 2020-10-13 ENCOUNTER — Other Ambulatory Visit: Payer: Self-pay

## 2020-10-13 VITALS — BP 90/62 | HR 93 | Temp 98.5°F | Ht 61.0 in | Wt 205.5 lb

## 2020-10-13 DIAGNOSIS — F901 Attention-deficit hyperactivity disorder, predominantly hyperactive type: Secondary | ICD-10-CM

## 2020-10-13 MED ORDER — METHYLPHENIDATE HCL 20 MG PO TABS
20.0000 mg | ORAL_TABLET | Freq: Every day | ORAL | 0 refills | Status: DC
Start: 2020-11-11 — End: 2020-12-21

## 2020-10-13 MED ORDER — METHYLPHENIDATE HCL 20 MG PO TABS: 20.0000 mg | ORAL_TABLET | Freq: Every day | ORAL | 0 refills | Status: DC

## 2020-10-13 NOTE — Patient Instructions (Signed)
Return in the end of October so I can see you before I go out for leave  Mychart or call if you start your new job and are needing 10 mg afternoon dose again

## 2020-10-13 NOTE — Progress Notes (Signed)
   Subjective:     Courtney [name] is a 30 y.o. female presenting for Follow-up (3 mo- ADHD )     HPI  #ADHD - doing well - just returned from the beach - recently had a job interview for EMT - Still taking Ritalin 20 mg will rarely take a 10 mg in the afternoon  Review of Systems   Social History   Tobacco Use  Smoking Status Never  Smokeless Tobacco Never        Objective:    BP Readings from Last 3 Encounters:  10/13/20 90/62  08/28/20 103/80  08/28/20 112/76   Wt Readings from Last 3 Encounters:  10/13/20 205 lb 8 oz (93.2 kg)  08/28/20 192 lb (87.1 kg)  07/11/20 210 lb (95.3 kg)    BP 90/62   Pulse 93   Temp 98.5 F (36.9 C) (Temporal)   Ht 5\' 1"  (1.549 m)   Wt 205 lb 8 oz (93.2 kg)   LMP  (LMP Unknown)   SpO2 98%   BMI 38.83 kg/m    Physical Exam Constitutional:      General: She is not in acute distress.    Appearance: She is well-developed. She is not diaphoretic.  HENT:     Right Ear: External ear normal.     Left Ear: External ear normal.     Nose: Nose normal.  Eyes:     Conjunctiva/sclera: Conjunctivae normal.  Cardiovascular:     Rate and Rhythm: Normal rate.  Pulmonary:     Effort: Pulmonary effort is normal.  Musculoskeletal:     Cervical back: Neck supple.  Skin:    General: Skin is warm and dry.     Capillary Refill: Capillary refill takes less than 2 seconds.  Neurological:     Mental Status: She is alert. Mental status is at baseline.  Psychiatric:        Mood and Affect: Mood normal.        Behavior: Behavior normal.          Assessment & Plan:   Problem List Items Addressed This Visit       Other   Attention deficit hyperactivity disorder (ADHD), predominantly hyperactive type - Primary    Doing well. Has been out of work, but just had a job interview. She disposed of her Ritalin 10 mg as she had not needed these for safety reasons. She will contact if she needs these when she returns to work. Cont ritalin  20 mg daily.       Relevant Medications   methylphenidate (RITALIN) 20 MG tablet   methylphenidate (RITALIN) 20 MG tablet (Start on 11/11/2020)   methylphenidate (RITALIN) 20 MG tablet (Start on 12/12/2020)     Return in about 3 months (around 01/01/2021) for ADHD f/u.  01/03/2021, MD  This visit occurred during the SARS-CoV-2 public health emergency.  Safety protocols were in place, including screening questions prior to the visit, additional usage of staff PPE, and extensive cleaning of exam room while observing appropriate contact time as indicated for disinfecting solutions.

## 2020-10-13 NOTE — Assessment & Plan Note (Signed)
Doing well. Has been out of work, but just had a job interview. She disposed of her Ritalin 10 mg as she had not needed these for safety reasons. She will contact if she needs these when she returns to work. Cont ritalin 20 mg daily.

## 2020-10-24 ENCOUNTER — Ambulatory Visit: Admit: 2020-10-24 | Discharge: 2020-10-25 | Payer: PRIVATE HEALTH INSURANCE

## 2020-10-24 DIAGNOSIS — L732 Hidradenitis suppurativa: Secondary | ICD-10-CM | POA: Diagnosis not present

## 2020-10-26 ENCOUNTER — Other Ambulatory Visit: Payer: Self-pay

## 2020-10-26 ENCOUNTER — Telehealth (INDEPENDENT_AMBULATORY_CARE_PROVIDER_SITE_OTHER): Payer: BC Managed Care – PPO | Admitting: Psychiatry

## 2020-10-26 ENCOUNTER — Encounter: Payer: Self-pay | Admitting: Psychiatry

## 2020-10-26 DIAGNOSIS — F3177 Bipolar disorder, in partial remission, most recent episode mixed: Secondary | ICD-10-CM | POA: Diagnosis not present

## 2020-10-26 DIAGNOSIS — F901 Attention-deficit hyperactivity disorder, predominantly hyperactive type: Secondary | ICD-10-CM | POA: Diagnosis not present

## 2020-10-26 NOTE — Progress Notes (Signed)
Virtual Visit via Video Note  I connected with Courtney [name] on 10/26/20 at  3:30 PM EDT by a video enabled telemedicine application and verified that I am speaking with the correct person using two identifiers.  Location Provider Location : [facility] Patient Location : Home  Participants: Patient , Provider    I discussed the limitations of evaluation and management by telemedicine and the availability of in person appointments. The patient expressed understanding and agreed to proceed.    I discussed the assessment and treatment plan with the patient. The patient was provided an opportunity to ask questions and all were answered. The patient agreed with the plan and demonstrated an understanding of the instructions.   The patient was advised to call back or seek an in-person evaluation if the symptoms worsen or if the condition fails to improve as anticipated.   BH MD OP Progress Note  10/26/2020 3:59 PM Courtney [name]  MRN:  [id number]  Chief Complaint:  Chief Complaint   Follow-up; Anxiety    HPI: Courtney [name] is a 30 year old female, single, lives in [place], has a history of bipolar disorder type II, ADHD, migraine headache, hidradenitis was evaluated by telemedicine today.  Patient today reports she is currently doing better with regards to her mood.  She is tolerating the Abilify well.  Denies side effects.  Her mood swings have improved.  Her depressive symptoms have improved.  She does not feel as anxious as she used to before.  She denies side effects to Abilify .  She reports sleep is overall good.  Patient denies suicidality, homicidality or perceptual disturbances.  She reports she just got hired recently for a new job and is excited about starting the job soon.  Patient is interested in weight loss, encouraged to start calorie counting, exercise more.  She will also have a discussion with primary care provider about referral to weight loss clinic.  Patient  denies any other concerns today.  Visit Diagnosis:    ICD-10-CM   1. Bipolar disorder, in partial remission, most recent episode mixed (HCC)  F31.77    type 2    2. Attention deficit hyperactivity disorder (ADHD), predominantly hyperactive type  F90.1       Past Psychiatric History: I have reviewed past psychiatric history from progress note on 08/04/2019.  Past trials of Ritalin, Daytrana patch, Adderall, risperidone, Concerta  Past Medical History:  Past Medical History:  Diagnosis Date   ADHD (attention deficit hyperactivity disorder)    Bipolar disorder (HCC)    Chronic tonsillitis 08/2013   current strep, will finish antibiotic 09/04/2013; snores during sleep, mother denies apnea   Family history of adverse reaction to anesthesia    " My cousin had problems waking up. "   Heart murmur    "when I was born; outgrew it; I was a preemie"   Hidradenitis    Hidradenitis 2017   Migraine 12/19/2016   "I've just had this one for 3 days" (12/20/2016)    Past Surgical History:  Procedure Laterality Date   AXILLARY HIDRADENITIS EXCISION     AXILLARY LYMPH NODE BIOPSY Left 01/06/2019   CYSTOSCOPY W/ URETERAL STENT PLACEMENT Right 05/28/2017   Procedure: CYSTOSCOPY WITH RETROGRADE PYELOGRAM/ RIGHT URETERAL STENT PLACEMENT;  Surgeon: Crist Fat, MD;  Location: [facility];  Service: Urology;  Laterality: Right;   HYDRADENITIS EXCISION Bilateral 04/03/2017   Procedure: EXCISION AND DRAINAGE BILATERAL  HIDRADENITIS AXILLA;  Surgeon: Griselda Miner, MD;  Location: [facility] OR;  Service: General;  Laterality: Bilateral;   INCISION AND DRAINAGE ABSCESS Right 07/20/2016   Procedure: INCISION AND DRAINAGE ABSCESS right axilla;  Surgeon: Abigail Miyamoto, MD;  Location: [facility] OR;  Service: General;  Laterality: Right;   INTRAUTERINE DEVICE (IUD) INSERTION  11/2016   "had the one in my left arm removed"   IRRIGATION AND DEBRIDEMENT ABSCESS Right 12/21/2016   Procedure: IRRIGATION AND DEBRIDEMENT RIGHT  AXILLARY HIDRADENITIS;  Surgeon: Andria Meuse, MD;  Location: [facility] OR;  Service: General;  Laterality: Right;   IRRIGATION AND DEBRIDEMENT ABSCESS Left 01/08/2017   Procedure: IRRIGATION AND DEBRIDEMENT AXILLARY ABSCESS;  Surgeon: Griselda Miner, MD;  Location: [facility] OR;  Service: General;  Laterality: Left;   NASAL SEPTOPLASTY W/ TURBINOPLASTY Bilateral 02/25/2019   Procedure: NASAL SEPTOPLASTY WITH TURBINATE REDUCTION;  Surgeon: Serena Colonel, MD;  Location: [facility];  Service: ENT;  Laterality: Bilateral;   TONSILLECTOMY Bilateral 09/14/2013   Procedure: BILATERAL TONSILLECTOMY;  Surgeon: Serena Colonel, MD;  Location: [facility];  Service: ENT;  Laterality: Bilateral;   TYMPANOPLASTY Bilateral    "rebuilt eardrums"   WISDOM TOOTH EXTRACTION      Family Psychiatric History: Reviewed family psychiatric history from progress note on 08/04/2019.  Family History:  Family History  Problem Relation Age of Onset   Hypertension Mother    Hyperlipidemia Mother    Diabetes Mother    Heart attack Father 54   Parkinson's disease Maternal Grandmother    Dementia Maternal Grandmother    Diabetes Maternal Grandmother    Bell's palsy Maternal Grandmother    Alcohol abuse Paternal Uncle     Social History: Reviewed social history from progress note on 08/04/2019. Social History   Socioeconomic History   Marital status: Single    Spouse name: Not on file   Number of children: Not on file   Years of education: EMS/Fire training   Highest education level: Not on file  Occupational History   Not on file  Tobacco Use   Smoking status: Never   Smokeless tobacco: Never  Vaping Use   Vaping Use: Former  Substance and Sexual Activity   Alcohol use: Not Currently   Drug use: Not Currently    Comment: in high school   Sexual activity: Yes    Birth control/protection: Pill, Condom  Other Topics Concern   Not on file  Social History Narrative   02/11/19   From: the  area   Living: with Mom currently, working to find her own place   Work: EMS/Fire      Family: good relationship with Mom and Grandma      Enjoys: spend time with friends/family, hunting, bomb fire      Exercise: every shift does training - 2-3 times a week   Diet: well rounded, meat heavy      Safety   Seat belts: Yes    Guns: Yes  and secure   Safe in relationships: Yes    Helmet: no    Social Determinants of Corporate investment banker Strain: Not on file  Food Insecurity: Not on file  Transportation Needs: Not on file  Physical Activity: Not on file  Stress: Not on file  Social Connections: Not on file    Allergies:  Allergies  Allergen Reactions   Other Hives    ABD pad causes rash Pt can only tolerate cloth tape Pt can only tolerate cloth tape   Shellfish Allergy Hives   Tapentadol Hives and Other (  See Comments)    Pt can only tolerate cloth tape   Tape Hives and Other (See Comments)    Pt can only tolerate cloth tape   Amoxicillin-Pot Clavulanate Other (See Comments)   Ginger Hives   Morphine And Related     " I become very angry"   Vancomycin Other (See Comments)    Burns her vein really bad and always causes them to blow.    Metabolic Disorder Labs: Lab Results  Component Value Date   HGBA1C 5.6 07/11/2020   No results found for: PROLACTIN Lab Results  Component Value Date   CHOL 195 07/11/2020   TRIG 224.0 (H) 07/11/2020   HDL 41.80 07/11/2020   CHOLHDL 5 07/11/2020   VLDL 44.8 (H) 07/11/2020   LDLCALC 119 (H) 05/18/2019   LDLCALC 116 (H) 12/03/2016   Lab Results  Component Value Date   TSH 1.32 08/05/2019   TSH 2.45 12/03/2016    Therapeutic Level Labs: No results found for: LITHIUM No results found for: VALPROATE No components found for:  CBMZ  Current Medications: Current Outpatient Medications  Medication Sig Dispense Refill   amoxicillin-clavulanate (AUGMENTIN) 875-125 MG tablet Take 1 tablet by mouth 2 (two) times daily.      ARIPiprazole (ABILIFY) 2 MG tablet Take 1 tablet (2 mg total) by mouth daily. 30 tablet 1   clindamycin (CLEOCIN) 300 MG capsule Take 300 mg by mouth 2 (two) times daily.     diclofenac (VOLTAREN) 75 MG EC tablet Take by mouth.     drospirenone-ethinyl estradiol (YAZ) 3-0.02 MG tablet [name] (28) 3 mg-0.02 mg tablet  TAKE 1 TABLET BY MOUTH EVERY DAY     EPINEPHrine (EPIPEN 2-PAK) 0.3 mg/0.3 mL IJ SOAJ injection Inject 0.3 mg into the muscle as needed for anaphylaxis. 1 each 0   fluconazole (DIFLUCAN) 150 MG tablet Take 150 mg by mouth once.     HYDROcodone-acetaminophen (NORCO/VICODIN) 5-325 MG tablet Take 1-2 tablets by mouth every 6 (six) hours as needed.     lamoTRIgine (LAMICTAL) 100 MG tablet TAKE 1/2 TABLET BY MOUTH TWICE A DAY 30 tablet 1   methylphenidate (RITALIN) 20 MG tablet Take 1 tablet (20 mg total) by mouth daily. 30 tablet 0   [START ON 11/11/2020] methylphenidate (RITALIN) 20 MG tablet Take 1 tablet (20 mg total) by mouth daily. 30 tablet 0   [START ON 12/12/2020] methylphenidate (RITALIN) 20 MG tablet Take 1 tablet (20 mg total) by mouth daily. 30 tablet 0   montelukast (SINGULAIR) 10 MG tablet TAKE 1 TABLET BY MOUTH EVERYDAY AT BEDTIME 90 tablet 3   ondansetron (ZOFRAN) 4 MG tablet Take 1 tablet (4 mg total) by mouth every 6 (six) hours. 12 tablet 0   triamcinolone ointment (KENALOG) 0.1 % APPLY TO AFFECTED AREA TWICE A DAY     No current facility-administered medications for this visit.     Musculoskeletal: Strength & Muscle Tone:  UTA Gait & Station: normal Patient leans: N/A  Psychiatric Specialty Exam: Review of Systems  Psychiatric/Behavioral:  Positive for dysphoric mood. The patient is nervous/anxious.   All other systems reviewed and are negative.  There were no vitals taken for this visit.There is no height or weight on file to calculate BMI.  General Appearance: Casual  Eye Contact:  Good  Speech:  Clear and Coherent  Volume:  Normal  Mood:  Anxious and  Depressed improving  Affect:  Congruent  Thought Process:  Goal Directed and Descriptions of Associations: Intact  Orientation:  Full (Time, Place, and Person)  Thought Content: Logical   Suicidal Thoughts:  No  Homicidal Thoughts:  No  Memory:  Immediate;   Fair Recent;   Fair Remote;   Fair  Judgement:  Fair  Insight:  Fair  Psychomotor Activity:  Normal  Concentration:  Concentration: Fair and Attention Span: Fair  Recall:  Fis[employer]Fair  Fund of Knowledge: Fair  Language: Fair  Akathisia:  No  Handed:  Right  AIMS (if indicated): done  Assets:  Communication Skills Desire for Improvement Housing Social Support Talents/Skills Transportation Vocational/Educational  ADL's:  Intact  Cognition: WNL  Sleep:  Fair   Screenings: GAD-7    Flowsheet Row Video Visit from 10/26/2020 in [facility] Office Visit from 10/07/2020 in [facility] Video Visit from 06/22/2020 in [facility] Office Visit from 01/20/2019 in [facility]  Total GAD-7 Score 3 10 4 3       PHQ2-9    Flowsheet Row Video Visit from 10/26/2020 in [facility] Office Visit from 10/07/2020 in [facility] Video Visit from 06[facility]22/2020 in [facility] Video Visit from 05/03/2020 in [facility] Office Visit from 01/20/2019 in [facility]  PHQ-2 Total Score 1 2 0 1 2  PHQ-9 Total Score 4 9 -- -- 6      Flowsheet Row Office Visit from 10/07/2020 in [facility] Most recent reading at 10/07/2020 11:45 AM ED from 08/28/2020 in [facility] Most recent reading at 08/28/2020 10:32 AM ED from 08/28/2020 in [facility] Most recent reading at 08/28/2020  9:25 AM  C-SSRS RISK CATEGORY No Risk No Risk No Risk        Assessment and  Plan: Courtney ApleyBrandy L [name] is a 30 year old Caucasian female, employed, lives in [place], has a history of ADHD, migraine headaches, bipolar disorder type II was evaluated by telemedicine today.  Patient is currently making progress with regards to her mood however is concerned about her weight gain and is interested in weight loss.  Discussed plan as noted below.  Plan Bipolar disorder type II depressed with mixed features mild-in partial remission Lamictal 100 mg p.o. daily Abilify 2 mg p.o. daily   ADHD-stable Ritalin 20 mg in the morning and 10 mg in the afternoon.  Patient advised about lifestyle modification, calorie counting, exercising more.  She will also establish care with weight loss clinic.  Follow-up in clinic in 1 month in office.  This note was generated in part or whole with voice recognition software. Voice recognition is usually quite accurate but there are transcription errors that can and very often do occur. I apologize for any typographical errors that were not detected and corrected.       Courtney LongsSaramma Yohan Samons, MD 10/26/2020, 3:59 PM

## 2020-10-28 ENCOUNTER — Telehealth: Payer: Self-pay

## 2020-10-28 NOTE — Telephone Encounter (Signed)
If patient wants to switch to another provide that would be fine for anyone accepting new patients.

## 2020-10-28 NOTE — Telephone Encounter (Signed)
Left message for patient to call me back. 

## 2020-10-28 NOTE — Telephone Encounter (Signed)
Patient called asking if she could transfer to Dr Milinda Antis from Dr Selena Batten? Her mom sees Dr Milinda Antis and she wanted to check.

## 2020-10-28 NOTE — Telephone Encounter (Signed)
I'm not taking new pts right now

## 2020-10-31 NOTE — Telephone Encounter (Signed)
Patient advised on her voicemail with details-ok per DPR on file. Asked patient to let us know if she wants to proceed any different otherwise Dr Selena Batten is still her PCP.

## 2020-11-16 DIAGNOSIS — L732 Hidradenitis suppurativa: Principal | ICD-10-CM

## 2020-11-17 ENCOUNTER — Other Ambulatory Visit: Payer: Self-pay

## 2020-11-17 ENCOUNTER — Ambulatory Visit (INDEPENDENT_AMBULATORY_CARE_PROVIDER_SITE_OTHER): Payer: BC Managed Care – PPO | Admitting: Family Medicine

## 2020-11-17 VITALS — BP 110/72 | HR 95 | Temp 96.1°F | Wt 196.2 lb

## 2020-11-17 DIAGNOSIS — E162 Hypoglycemia, unspecified: Secondary | ICD-10-CM | POA: Diagnosis not present

## 2020-11-17 DIAGNOSIS — Z23 Encounter for immunization: Secondary | ICD-10-CM

## 2020-11-17 NOTE — Progress Notes (Signed)
Subjective:     Courtney [name] is a 30 y.o. female presenting for Blood Sugar Problem (BS dropping throughout the day. Has been as low as 65 at work. )     HPI  #Low blood sugar - as low as 65 - 1 hour after eating - was feeling hot - sweaty, clammy - has had symptoms several times at work and home over the last 2 weeks - no change in diet - has been trying to eat healthy - less fast food - eats 3 meals and snacks throughout the day - has checked blood sugar with every episode and it has always been low - has been having right eye floaters and flashing lights - these had gone away and they came back  Review of Systems   Social History   Tobacco Use  Smoking Status Never  Smokeless Tobacco Never        Objective:    BP Readings from Last 3 Encounters:  11/17/20 110/72  10/13/20 90/62  08/28/20 103/80   Wt Readings from Last 3 Encounters:  11/17/20 196 lb 4 oz (89 kg)  10/13/20 205 lb 8 oz (93.2 kg)  08/28/20 192 lb (87.1 kg)    BP 110/72   Pulse 95   Temp (!) 96.1 F (35.6 C) (Temporal)   Wt 196 lb 4 oz (89 kg)   SpO2 98%   BMI 37.08 kg/m    Physical Exam Constitutional:      General: She is not in acute distress.    Appearance: She is well-developed. She is not diaphoretic.  HENT:     Right Ear: External ear normal.     Left Ear: External ear normal.  Eyes:     Conjunctiva/sclera: Conjunctivae normal.  Cardiovascular:     Rate and Rhythm: Normal rate and regular rhythm.     Heart sounds: No murmur heard. Pulmonary:     Effort: Pulmonary effort is normal. No respiratory distress.     Breath sounds: Normal breath sounds. No wheezing.  Musculoskeletal:     Cervical back: Neck supple.  Lymphadenopathy:     Cervical: No cervical adenopathy.  Skin:    General: Skin is warm and dry.     Capillary Refill: Capillary refill takes less than 2 seconds.  Neurological:     Mental Status: She is alert. Mental status is at baseline.  Psychiatric:         Mood and Affect: Mood normal.        Behavior: Behavior normal.          Assessment & Plan:   Problem List Items Addressed This Visit       Endocrine   Hypoglycemia - Primary    Pt with recurrent hypoglycemic symptomatic episodes within 1-2 hours of eating which resolve with sugar pills. Discussed that this is concerning for Whipples triad and would refer to endocrinology as work-up involves specific timing and evaluation at that time. 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 She does have a chronic infection with hidradenitis and denies any drug/alcohol consumption. Will check CBC to evaluate for significant infection. She will also eat more frequently and complex carb + protein to see if symptoms improve. Endo referral for addition work-up      Relevant Orders   CBC with Differential   Comprehensive metabolic panel   Ambulatory referral to Endocrinology   Other Visit Diagnoses  Need for influenza vaccination       Relevant Orders   Flu Vaccine QUAD 51mo+IM (Fluarix, Fluzone & Alfiuria Quad PF) (Completed)        Return if symptoms worsen or fail to improve.  Lynnda Child, MD  This visit occurred during the SARS-CoV-2 public health emergency.  Safety protocols were in place, including screening questions prior to the visit, additional usage of staff PPE, and extensive cleaning of exam room while observing appropriate contact time as indicated for disinfecting solutions.

## 2020-11-17 NOTE — Patient Instructions (Signed)
#   Hypoglycemia - eat more frequently - more complex carbs and protein  - Keep journal of what you ate and timing of episodes - if no improvement, consider looking for triggers and modifying diet   #Referral - Endocrine  I have placed a referral to a specialist for you. You should receive a phone call from the specialty office. Make sure your voicemail is not full and that if you are able to answer your phone to unknown or new numbers.   It may take up to 2 weeks to hear about the referral. If you do not hear anything in 2 weeks, please call our office and ask to speak with the referral coordinator.

## 2020-11-17 NOTE — Assessment & Plan Note (Signed)
Pt with recurrent hypoglycemic symptomatic episodes within 1-2 hours of eating which resolve with sugar pills. Discussed that this is concerning for Whipples triad and would refer to endocrinology as work-up involves specific timing and evaluation at that time. 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 She does have a chronic infection with hidradenitis and denies any drug/alcohol consumption. Will check CBC to evaluate for significant infection. She will also eat more frequently and complex carb + protein to see if symptoms improve. Endo referral for addition work-up

## 2020-11-18 LAB — CBC WITH DIFFERENTIAL/PLATELET
Basophils Absolute: 0.1 10*3/uL (ref 0.0–0.1)
Basophils Relative: 1 % (ref 0.0–3.0)
Eosinophils Absolute: 0.2 10*3/uL (ref 0.0–0.7)
Eosinophils Relative: 1.7 % (ref 0.0–5.0)
HCT: 34.6 % — ABNORMAL LOW (ref 36.0–46.0)
Hemoglobin: 11.1 g/dL — ABNORMAL LOW (ref 12.0–15.0)
Lymphocytes Relative: 31.9 % (ref 12.0–46.0)
Lymphs Abs: 4.2 10*3/uL — ABNORMAL HIGH (ref 0.7–4.0)
MCHC: 32.1 g/dL (ref 30.0–36.0)
MCV: 86.4 fl (ref 78.0–100.0)
Monocytes Absolute: 0.9 10*3/uL (ref 0.1–1.0)
Monocytes Relative: 6.9 % (ref 3.0–12.0)
Neutro Abs: 7.6 10*3/uL (ref 1.4–7.7)
Neutrophils Relative %: 58.5 % (ref 43.0–77.0)
Platelets: 360 10*3/uL (ref 150.0–400.0)
RBC: 4 Mil/uL (ref 3.87–5.11)
RDW: 13.8 % (ref 11.5–15.5)
WBC: 13 10*3/uL — ABNORMAL HIGH (ref 4.0–10.5)

## 2020-11-18 LAB — COMPREHENSIVE METABOLIC PANEL
ALT: 23 U/L (ref 0–35)
AST: 21 U/L (ref 0–37)
Albumin: 3.4 g/dL — ABNORMAL LOW (ref 3.5–5.2)
Alkaline Phosphatase: 94 U/L (ref 39–117)
BUN: 6 mg/dL (ref 6–23)
CO2: 24 mEq/L (ref 19–32)
Calcium: 9.6 mg/dL (ref 8.4–10.5)
Chloride: 102 mEq/L (ref 96–112)
Creatinine, Ser: 0.77 mg/dL (ref 0.40–1.20)
GFR: 105.34 mL/min (ref >60.00)
Glucose, Bld: 90 mg/dL (ref 70–99)
Potassium: 4.3 mEq/L (ref 3.5–5.1)
Sodium: 136 mEq/L (ref 135–145)
Total Bilirubin: 0.2 mg/dL (ref 0.2–1.2)
Total Protein: 8.3 g/dL (ref 6.0–8.3)

## 2020-12-05 ENCOUNTER — Ambulatory Visit: Admit: 2020-12-05 | Discharge: 2020-12-06 | Payer: PRIVATE HEALTH INSURANCE

## 2020-12-05 DIAGNOSIS — L732 Hidradenitis suppurativa: Secondary | ICD-10-CM | POA: Diagnosis not present

## 2020-12-07 ENCOUNTER — Other Ambulatory Visit: Payer: Self-pay | Admitting: Psychiatry

## 2020-12-07 DIAGNOSIS — F3181 Bipolar II disorder: Secondary | ICD-10-CM

## 2020-12-09 ENCOUNTER — Ambulatory Visit: Payer: BC Managed Care – PPO | Admitting: Psychiatry

## 2020-12-12 DIAGNOSIS — G8918 Other acute postprocedural pain: Principal | ICD-10-CM

## 2020-12-12 DIAGNOSIS — L732 Hidradenitis suppurativa: Principal | ICD-10-CM

## 2020-12-12 MED ORDER — HYDROCODONE 5 MG-ACETAMINOPHEN 325 MG TABLET
ORAL_TABLET | ORAL | 0 refills | 0.00000 days | Status: CP
Start: 2020-12-12 — End: ?

## 2020-12-21 ENCOUNTER — Other Ambulatory Visit: Payer: Self-pay

## 2020-12-21 ENCOUNTER — Ambulatory Visit: Payer: BC Managed Care – PPO | Admitting: Family Medicine

## 2020-12-21 VITALS — BP 100/78 | HR 108 | Temp 96.3°F | Wt 182.0 lb

## 2020-12-21 DIAGNOSIS — F901 Attention-deficit hyperactivity disorder, predominantly hyperactive type: Secondary | ICD-10-CM | POA: Diagnosis not present

## 2020-12-21 DIAGNOSIS — E162 Hypoglycemia, unspecified: Secondary | ICD-10-CM | POA: Diagnosis not present

## 2020-12-21 DIAGNOSIS — D649 Anemia, unspecified: Secondary | ICD-10-CM | POA: Diagnosis not present

## 2020-12-21 DIAGNOSIS — J0100 Acute maxillary sinusitis, unspecified: Secondary | ICD-10-CM

## 2020-12-21 MED ORDER — METHYLPHENIDATE HCL 20 MG PO TABS: 20.0000 mg | ORAL_TABLET | Freq: Every day | ORAL | 0 refills | Status: DC

## 2020-12-21 MED ORDER — METHYLPHENIDATE HCL 20 MG PO TABS: 20.0000 mg | ORAL_TABLET | Freq: Two times a day (BID) | ORAL | 0 refills | Status: DC

## 2020-12-21 MED ORDER — AMOXICILLIN-POT CLAVULANATE 875-125 MG PO TABS: 1.0000 | ORAL_TABLET | Freq: Two times a day (BID) | ORAL | 0 refills | Status: AC

## 2020-12-21 NOTE — Assessment & Plan Note (Signed)
Doing well. Cont ritalin 20 mg. Return 4 months for refills

## 2020-12-21 NOTE — Progress Notes (Signed)
Subjective:     Courtney [name] is a 30 y.o. 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 - has been trying to do snacks more regularly and continues to still get symptomatic  #anemia - no cycles are infrequent - no regularly bleeding - no blood in stool or urine  #ADHD - still working well with the short acting once daily  #sinus symptoms - taking mucinex - facial pain on the left maxillar - head pressure and pain  - no fever, some chills - cough - no sore throat  Review of Systems   Social History   Tobacco Use  Smoking Status Never  Smokeless Tobacco Never        Objective:    BP Readings from Last 3 Encounters:  12/21/20 100/78  11/17/20 110/72  10/13/20 90/62   Wt Readings from Last 3 Encounters:  12/21/20 182 lb (82.6 kg)  11/17/20 196 lb 4 oz (89 kg)  10/13/20 205 lb 8 oz (93.2 kg)    BP 100/78   Pulse (!) 108   Temp (!) 96.3 F (35.7 C) (Temporal)   Wt 182 lb (82.6 kg)   SpO2 98%   BMI 34.39 kg/m    Physical Exam Constitutional:      General: She is not in acute distress.    Appearance: She is well-developed. She is not diaphoretic.  HENT:     Right Ear: Tympanic membrane and external ear normal.     Left Ear: Tympanic membrane and external ear normal.     Nose: Congestion present.     Right Sinus: No maxillary sinus tenderness or frontal sinus tenderness.     Left Sinus: Maxillary sinus tenderness present. No frontal sinus tenderness.     Mouth/Throat:     Pharynx: No posterior oropharyngeal erythema.  Eyes:     Conjunctiva/sclera: Conjunctivae normal.  Cardiovascular:     Rate and Rhythm: Normal rate and regular rhythm.     Heart sounds: No murmur  heard. Pulmonary:     Effort: Pulmonary effort is normal. No respiratory distress.     Breath sounds: Normal breath sounds. No wheezing.  Musculoskeletal:     Cervical back: Neck supple.  Skin:    General: Skin is warm and dry.     Capillary Refill: Capillary refill takes less than 2 seconds.  Neurological:     Mental Status: She is alert. Mental status is at baseline.  Psychiatric:        Mood and Affect: Mood normal.        Behavior: Behavior normal.          Assessment & Plan:   Problem List Items Addressed This Visit       Endocrine   Hypoglycemia    Symptoms persisting and worsening - she has been eating frequent snacks but still gets symptomatic (sweats, headache, lightheaded) with CBG <100. 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 Endo referral placed 1 month ago given lack of response to more frequent snacks would benefit from expedited evaluation.         Other   Attention deficit hyperactivity  disorder (ADHD), predominantly hyperactive type - Primary    Doing well. Cont ritalin 20 mg. Return 4 months for refills      Relevant Medications   methylphenidate (RITALIN) 20 MG tablet (Start on 02/17/2021)   methylphenidate (RITALIN) 20 MG tablet (Start on 01/20/2021)   methylphenidate (RITALIN) 20 MG tablet   methylphenidate (RITALIN) 20 MG tablet (Start on 03/20/2021)   Anemia    Mild on last lab tests. Advised 1 month of iron supplement.  No severe menses or other cause of bleeding      Other Visit Diagnoses     Acute non-recurrent maxillary sinusitis       Relevant Medications   amoxicillin-clavulanate (AUGMENTIN) 875-125 MG tablet      Congestion x 1 week, and face pain - abx for sinus infection   Return in about 4 months (around 04/23/2021).  Lynnda Child, MD  This visit occurred during the SARS-CoV-2 public health emergency.  Safety protocols were in place, including screening questions prior to the visit,  additional usage of staff PPE, and extensive cleaning of exam room while observing appropriate contact time as indicated for disinfecting solutions.

## 2020-12-21 NOTE — Assessment & Plan Note (Signed)
Symptoms persisting and worsening - she has been eating frequent snacks but still gets symptomatic (sweats, headache, lightheaded) with CBG <100. 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 Endo referral placed 1 month ago given lack of response to more frequent snacks would benefit from expedited evaluation.

## 2020-12-21 NOTE — Assessment & Plan Note (Addendum)
Mild on last lab tests. Advised 1 month of iron supplement.  No severe menses or other cause of bleeding

## 2020-12-21 NOTE — Patient Instructions (Addendum)
Take Iron for 1 month  Return in 4 months for ADHD  Update in 1 week if you have not heard from Endocrine

## 2020-12-31 ENCOUNTER — Other Ambulatory Visit: Payer: Self-pay | Admitting: Psychiatry

## 2020-12-31 DIAGNOSIS — F3181 Bipolar II disorder: Secondary | ICD-10-CM

## 2021-01-02 ENCOUNTER — Ambulatory Visit: Payer: BC Managed Care – PPO | Admitting: Family Medicine

## 2021-01-07 ENCOUNTER — Other Ambulatory Visit: Payer: Self-pay | Admitting: Family Medicine

## 2021-01-07 DIAGNOSIS — J4520 Mild intermittent asthma, uncomplicated: Secondary | ICD-10-CM

## 2021-01-09 ENCOUNTER — Ambulatory Visit: Payer: BC Managed Care – PPO | Admitting: Psychiatry

## 2021-01-09 DIAGNOSIS — L732 Hidradenitis suppurativa: Principal | ICD-10-CM

## 2021-01-16 ENCOUNTER — Ambulatory Visit: Admit: 2021-01-16 | Discharge: 2021-01-17 | Payer: PRIVATE HEALTH INSURANCE

## 2021-01-16 DIAGNOSIS — L732 Hidradenitis suppurativa: Secondary | ICD-10-CM | POA: Diagnosis not present

## 2021-01-16 DIAGNOSIS — Z79899 Other long term (current) drug therapy: Secondary | ICD-10-CM | POA: Diagnosis not present

## 2021-01-16 DIAGNOSIS — Z5181 Encounter for therapeutic drug level monitoring: Secondary | ICD-10-CM | POA: Diagnosis not present

## 2021-01-24 DIAGNOSIS — L732 Hidradenitis suppurativa: Principal | ICD-10-CM

## 2021-01-24 MED ORDER — HYDROCODONE 5 MG-ACETAMINOPHEN 325 MG TABLET
ORAL_TABLET | ORAL | 0 refills | 0.00000 days | Status: CP
Start: 2021-01-24 — End: ?

## 2021-01-30 DIAGNOSIS — Z113 Encounter for screening for infections with a predominantly sexual mode of transmission: Secondary | ICD-10-CM | POA: Diagnosis not present

## 2021-01-30 DIAGNOSIS — Z6834 Body mass index (BMI) 34.0-34.9, adult: Secondary | ICD-10-CM | POA: Diagnosis not present

## 2021-01-30 DIAGNOSIS — Z01419 Encounter for gynecological examination (general) (routine) without abnormal findings: Secondary | ICD-10-CM | POA: Diagnosis not present

## 2021-02-03 ENCOUNTER — Other Ambulatory Visit: Payer: Self-pay

## 2021-02-03 ENCOUNTER — Ambulatory Visit (INDEPENDENT_AMBULATORY_CARE_PROVIDER_SITE_OTHER): Payer: BC Managed Care – PPO | Admitting: Endocrinology

## 2021-02-03 VITALS — BP 100/70 | HR 86 | Ht 61.0 in | Wt 178.4 lb

## 2021-02-03 DIAGNOSIS — E559 Vitamin D deficiency, unspecified: Secondary | ICD-10-CM

## 2021-02-03 DIAGNOSIS — E162 Hypoglycemia, unspecified: Secondary | ICD-10-CM

## 2021-02-03 LAB — POCT GLYCOSYLATED HEMOGLOBIN (HGB A1C): Hemoglobin A1C: 5.8 % — AB (ref 4.0–5.6)

## 2021-02-03 NOTE — Patient Instructions (Addendum)
please see a dietician. Blood tests are requested for you today.  Please do fasting soon.  You can do at Dr Cody's office.  We'll let you know about the results.   Please come back for a follow-up appointment in 3 months.

## 2021-02-03 NOTE — Progress Notes (Signed)
Subjective:    Patient ID: Courtney [name], female    DOB: [birth year], 30 y.o.   MRN: [id number]  HPI Pt is referred by Dr Selena Batten, for hypoglycemia.  Pt reports 4 mos of intermittent sweating/HA/lightheadedness.  This happens approx 1 hr after eating. Symptoms are usually relieved by eating or drinking.  She takes no medication for the blood sugar.  She has never had weight loss surgery, alcohol intake, adrenal problems, pituitary problems, liver problems, or kidney problems.  She has never had pancreatic imaging. She has checked cbg during an episode.  The lowest she has recorded was 95, but she thinks it is going lower.  She does not have sxs in the middle of the night. Past Medical History:  Diagnosis Date   ADHD (attention deficit hyperactivity disorder)    Bipolar disorder (HCC)    Chronic tonsillitis 08/2013   current strep, will finish antibiotic 09/04/2013; snores during sleep, mother denies apnea   Family history of adverse reaction to anesthesia    " My cousin had problems waking up. "   Heart murmur    "when I was born; outgrew it; I was a preemie"   Hidradenitis    Hidradenitis 2017   Migraine 12/19/2016   "I've just had this one for 3 days" (12/20/2016)    Past Surgical History:  Procedure Laterality Date   AXILLARY HIDRADENITIS EXCISION     AXILLARY LYMPH NODE BIOPSY Left 01/06/2019   CYSTOSCOPY W/ URETERAL STENT PLACEMENT Right 05/28/2017   Procedure: CYSTOSCOPY WITH RETROGRADE PYELOGRAM/ RIGHT URETERAL STENT PLACEMENT;  Surgeon: Crist Fat, MD;  Location: [facility];  Service: Urology;  Laterality: Right;   HYDRADENITIS EXCISION Bilateral 04/03/2017   Procedure: EXCISION AND DRAINAGE BILATERAL  HIDRADENITIS AXILLA;  Surgeon: Griselda Miner, MD;  Location: [facility] OR;  Service: General;  Laterality: Bilateral;   INCISION AND DRAINAGE ABSCESS Right 07/20/2016   Procedure: INCISION AND DRAINAGE ABSCESS right axilla;  Surgeon: Abigail Miyamoto, MD;  Location: [facility] OR;  Service:  General;  Laterality: Right;   INTRAUTERINE DEVICE (IUD) INSERTION  11/2016   "had the one in my left arm removed"   IRRIGATION AND DEBRIDEMENT ABSCESS Right 12/21/2016   Procedure: IRRIGATION AND DEBRIDEMENT RIGHT AXILLARY HIDRADENITIS;  Surgeon: Andria Meuse, MD;  Location: [facility] OR;  Service: General;  Laterality: Right;   IRRIGATION AND DEBRIDEMENT ABSCESS Left 01/08/2017   Procedure: IRRIGATION AND DEBRIDEMENT AXILLARY ABSCESS;  Surgeon: Griselda Miner, MD;  Location: [facility] OR;  Service: General;  Laterality: Left;   NASAL SEPTOPLASTY W/ TURBINOPLASTY Bilateral 02/25/2019   Procedure: NASAL SEPTOPLASTY WITH TURBINATE REDUCTION;  Surgeon: Serena Colonel, MD;  Location: [facility];  Service: ENT;  Laterality: Bilateral;   TONSILLECTOMY Bilateral 09/14/2013   Procedure: BILATERAL TONSILLECTOMY;  Surgeon: Serena Colonel, MD;  Location: [facility];  Service: ENT;  Laterality: Bilateral;   TYMPANOPLASTY Bilateral    "rebuilt eardrums"   WISDOM TOOTH EXTRACTION      Social History   Socioeconomic History   Marital status: Single    Spouse name: Not on file   Number of children: Not on file   Years of education: EMS/Fire training   Highest education level: Not on file  Occupational History   Not on file  Tobacco Use   Smoking status: Never   Smokeless tobacco: Never  Vaping Use   Vaping Use: Former  Substance and Sexual Activity   Alcohol use: Not Currently   Drug use: Not Currently  Comment: in high school   Sexual activity: Yes    Birth control/protection: Pill, Condom  Other Topics Concern   Not on file  Social History Narrative   02/11/19   From: the area   Living: with Mom currently, working to find her own place   Work: EMS/Fire      Family: good relationship with Mom and Grandma      Enjoys: spend time with friends/family, hunting, bomb fire      Exercise: every shift does training - 2-3 times a week   Diet: well rounded, meat heavy       Safety   Seat belts: Yes    Guns: Yes  and secure   Safe in relationships: Yes    Helmet: no    Social Determinants of Corporate investment banker Strain: Not on file  Food Insecurity: Not on file  Transportation Needs: Not on file  Physical Activity: Not on file  Stress: Not on file  Social Connections: Not on file  Intimate Partner Violence: Not on file    Current Outpatient Medications on File Prior to Visit  Medication Sig Dispense Refill   ARIPiprazole (ABILIFY) 2 MG tablet TAKE 1 TABLET BY MOUTH EVERY DAY 90 tablet 0   clindamycin (CLEOCIN) 300 MG capsule Take 300 mg by mouth 2 (two) times daily.     diclofenac (VOLTAREN) 75 MG EC tablet Take by mouth.     drospirenone-ethinyl estradiol (YAZ) 3-0.02 MG tablet [name] (28) 3 mg-0.02 mg tablet  TAKE 1 TABLET BY MOUTH EVERY DAY     EPINEPHrine (EPIPEN 2-PAK) 0.3 mg/0.3 mL IJ SOAJ injection Inject 0.3 mg into the muscle as needed for anaphylaxis. 1 each 0   fluconazole (DIFLUCAN) 150 MG tablet Take 150 mg by mouth once.     HYDROcodone-acetaminophen (NORCO/VICODIN) 5-325 MG tablet Take 1-2 tablets by mouth every 6 (six) hours as needed.     Iron-Vitamin C 100-250 MG TABS iron     lamoTRIgine (LAMICTAL) 100 MG tablet TAKE 1/2 TABLET BY MOUTH TWICE A DAY 30 tablet 1   [START ON 02/17/2021] methylphenidate (RITALIN) 20 MG tablet Take 1 tablet (20 mg total) by mouth daily. 30 tablet 0   methylphenidate (RITALIN) 20 MG tablet Take 1 tablet (20 mg total) by mouth daily. 30 tablet 0   methylphenidate (RITALIN) 20 MG tablet Take 1 tablet (20 mg total) by mouth daily. 30 tablet 0   [START ON 03/20/2021] methylphenidate (RITALIN) 20 MG tablet Take 1 tablet (20 mg total) by mouth 2 (two) times daily. 30 tablet 0   montelukast (SINGULAIR) 10 MG tablet TAKE 1 TABLET BY MOUTH EVERYDAY AT BEDTIME 90 tablet 3   ondansetron (ZOFRAN) 4 MG tablet Take 1 tablet (4 mg total) by mouth every 6 (six) hours. 12 tablet 0   triamcinolone ointment (KENALOG)  0.1 % APPLY TO AFFECTED AREA TWICE A DAY     No current facility-administered medications on file prior to visit.    Allergies  Allergen Reactions   Other Hives    ABD pad causes rash Pt can only tolerate cloth tape Pt can only tolerate cloth tape   Shellfish Allergy Hives   Tapentadol Hives and Other (See Comments)    Pt can only tolerate cloth tape   Tape Hives and Other (See Comments)    Pt can only tolerate cloth tape   Ginger Hives   Morphine And Related     " I become very angry"   Vancomycin  Other (See Comments)    Burns her vein really bad and always causes them to blow.    Family History  Problem Relation Age of Onset   Hypertension Mother    Hyperlipidemia Mother    Diabetes Mother    Heart attack Father 28   Parkinson's disease Maternal Grandmother    Dementia Maternal Grandmother    Diabetes Maternal Grandmother    Bell's palsy Maternal Grandmother    Alcohol abuse Paternal Uncle     BP 100/70   Pulse 86   Ht 5\' 1"  (1.549 m)   Wt 178 lb 6.4 oz (80.9 kg)   SpO2 98%   BMI 33.71 kg/m      Review of Systems denies rash, galactorrhea, n/v, seizure, or LOC.  She has lost 30 lbs x 2 mos--not intentional. She has chronically irregular menses.     Objective:   Physical Exam VS: see vs page GEN: no distress HEAD: head: no deformity eyes: no periorbital swelling, no proptosis external nose and ears are normal NECK: supple, thyroid is not enlarged CHEST WALL: no deformity LUNGS: clear to auscultation CV: reg rate and rhythm, no murmur.  MUSCULOSKELETAL: gait is normal and steady EXTEMITIES: no deformity.  no leg edema NEURO:  readily moves all 4's.  sensation is intact to touch on all 4's SKIN:  Normal texture and temperature.  No rash or suspicious lesion is visible.   NODES:  None palpable at the neck PSYCH: alert, well-oriented.  Does not appear anxious nor depressed.     Lab Results  Component Value Date   HGBA1C 5.8 (A) 02/03/2021   I  have reviewed outside records, and summarized: Pt was noted to have elevated A1c, and referred here.  She was rx'ed ritalin for ADHD     Assessment & Plan:  Reactive hypoglycemia, new to me.  I told pt this is the evolution of DM.  She declines medication.    Patient Instructions  please see a dietician. Blood tests are requested for you today.  Please do fasting soon.  You can do at Dr Cody's office.  We'll let you know about the results.   Please come back for a follow-up appointment in 3 months.

## 2021-02-06 ENCOUNTER — Telehealth: Payer: Self-pay | Admitting: Family Medicine

## 2021-02-06 NOTE — Telephone Encounter (Signed)
error 

## 2021-02-06 NOTE — Telephone Encounter (Signed)
Pt mother called stating that pt needed labs ordered for fasting blood work for sugar. Pt mother didn't have all the details. Pt mother states that its in pt MyChart.

## 2021-02-06 NOTE — Telephone Encounter (Signed)
Called [name] and got Courtney [name] set up for 12/13 @1130  at [facility]

## 2021-02-07 DIAGNOSIS — L732 Hidradenitis suppurativa: Principal | ICD-10-CM

## 2021-02-14 ENCOUNTER — Other Ambulatory Visit (INDEPENDENT_AMBULATORY_CARE_PROVIDER_SITE_OTHER): Payer: BC Managed Care – PPO

## 2021-02-14 ENCOUNTER — Other Ambulatory Visit: Payer: Self-pay

## 2021-02-14 DIAGNOSIS — E162 Hypoglycemia, unspecified: Secondary | ICD-10-CM | POA: Diagnosis not present

## 2021-02-14 DIAGNOSIS — E559 Vitamin D deficiency, unspecified: Secondary | ICD-10-CM | POA: Diagnosis not present

## 2021-02-14 LAB — T4, FREE: Free T4: 0.82 ng/dL (ref 0.60–1.60)

## 2021-02-14 LAB — TSH: TSH: 0.62 u[IU]/mL (ref 0.35–5.50)

## 2021-02-14 LAB — CORTISOL: Cortisol, Plasma: 22 ug/dL

## 2021-02-14 LAB — VITAMIN D 25 HYDROXY (VIT D DEFICIENCY, FRACTURES): VITD: 27 ng/mL — ABNORMAL LOW (ref 30.00–100.00)

## 2021-02-15 LAB — GLUCOSE, FASTING: Glucose, Bld: 78 mg/dL (ref 65–99)

## 2021-02-24 LAB — CATECHOLAMINES, FRACTIONATED, PLASMA
Dopamine: <10 pg/mL
Epinephrine: 34 pg/mL
Norepinephrine: 205 pg/mL — ABNORMAL LOW
Total Catecholamines: 239 pg/mL — ABNORMAL LOW

## 2021-02-24 LAB — INSULIN, RANDOM: Insulin: 16 u[IU]/mL

## 2021-02-24 LAB — C-PEPTIDE: C-Peptide: 3.2 ng/mL (ref 0.80–3.85)

## 2021-02-28 ENCOUNTER — Ambulatory Visit: Admit: 2021-02-28 | Discharge: 2021-03-01 | Payer: PRIVATE HEALTH INSURANCE

## 2021-02-28 DIAGNOSIS — L732 Hidradenitis suppurativa: Secondary | ICD-10-CM | POA: Diagnosis not present

## 2021-03-07 ENCOUNTER — Ambulatory Visit: Payer: BC Managed Care – PPO | Admitting: Psychiatry

## 2021-03-24 DIAGNOSIS — L732 Hidradenitis suppurativa: Principal | ICD-10-CM

## 2021-03-27 ENCOUNTER — Other Ambulatory Visit: Payer: Self-pay

## 2021-03-27 ENCOUNTER — Ambulatory Visit (INDEPENDENT_AMBULATORY_CARE_PROVIDER_SITE_OTHER): Payer: BC Managed Care – PPO | Admitting: Psychiatry

## 2021-03-27 ENCOUNTER — Encounter: Payer: Self-pay | Admitting: Psychiatry

## 2021-03-27 VITALS — BP 126/80 | HR 125 | Temp 97.9°F | Wt 180.6 lb

## 2021-03-27 DIAGNOSIS — G4700 Insomnia, unspecified: Secondary | ICD-10-CM | POA: Diagnosis not present

## 2021-03-27 DIAGNOSIS — F3181 Bipolar II disorder: Secondary | ICD-10-CM

## 2021-03-27 DIAGNOSIS — F901 Attention-deficit hyperactivity disorder, predominantly hyperactive type: Secondary | ICD-10-CM | POA: Diagnosis not present

## 2021-03-27 DIAGNOSIS — R000 Tachycardia, unspecified: Secondary | ICD-10-CM | POA: Diagnosis not present

## 2021-03-27 MED ORDER — TRAZODONE HCL 50 MG PO TABS: 50.0000 mg | ORAL_TABLET | Freq: Every day | ORAL | 0 refills | Status: DC

## 2021-03-27 MED ORDER — ARIPIPRAZOLE 2 MG PO TABS: 2.0000 mg | ORAL_TABLET | Freq: Every day | ORAL | 1 refills | Status: DC

## 2021-03-27 MED ORDER — LAMOTRIGINE 100 MG PO TABS: 50.0000 mg | ORAL_TABLET | Freq: Two times a day (BID) | ORAL | 1 refills | Status: DC

## 2021-03-27 NOTE — Progress Notes (Signed)
[facility] [facility] MD OP Progress Note  03/27/2021 5:22 PM Courtney [name]  MRN:  [id number]  Chief Complaint:  Chief Complaint   Follow-up; 31 year old female, with history of bipolar disorder type II, ADHD, presents with worsening sleep and mood for medication management.    HPI: Courtney [name] is a 31 year old female, single, lives in [place], has a history of bipolar disorder type II, ADHD, migraine headache, hidradenitis, was evaluated in office today.  Patient today reports she has been feeling mildly depressed since the past 1 month or so.  There has been a lot of changes.  She has started working full-time with EMS.  Her schedule is currently all over the place and she works day shift or night shift depending on her schedule.  She also is planning to move into a new place by herself.  All these are major changes in her life.  She reports sleep as poor since she does not have a good sleep hygiene.  She is currently not on any sleep medication.  Patient also reports she has been taking her Ritalin closer to bedtime due to her work schedule and that also could be affecting her sleep.  Patient is compliant on her Lamictal as well as Abilify.  Patient with history of tachycardia today was noted as having elevated heart rate in session.  Patient advised to monitor her blood pressure and heart rate and also to hold her Abilify and Ritalin and to follow up with her primary provider.  Patient denies any suicidality, homicidality or perceptual disturbances.  Patient denies any other concerns today.  Visit Diagnosis:    ICD-10-CM   1. Bipolar 2 disorder, major depressive episode (HCC)  F31.81 lamoTRIgine (LAMICTAL) 100 MG tablet   mild, mixed features    2. Insomnia, unspecified type  G47.00 traZODone (DESYREL) 50 MG tablet    3. Attention deficit hyperactivity disorder (ADHD), predominantly hyperactive type  F90.1     4. Tachycardia  R00.0       Past Psychiatric History: Reviewed past psychiatric  history from progress note on 08/04/2019.  Past trials of Ritalin, Daytrana patch, Adderall, risperidone, Concerta  Past Medical History:  Past Medical History:  Diagnosis Date   ADHD (attention deficit hyperactivity disorder)    Bipolar disorder ([place])    Chronic tonsillitis 08/2013   current strep, will finish antibiotic 09/04/2013; snores during sleep, mother denies apnea   Family history of adverse reaction to anesthesia    " My cousin had problems waking up. "   Heart murmur    "when I was born; outgrew it; I was a preemie"   Hidradenitis    Hidradenitis 2017   Migraine 12/19/2016   "I've just had this one for 3 days" (12/20/2016)    Past Surgical History:  Procedure Laterality Date   AXILLARY HIDRADENITIS EXCISION     AXILLARY LYMPH NODE BIOPSY Left 01/06/2019   CYSTOSCOPY W/ URETERAL STENT PLACEMENT Right 05/28/2017   Procedure: CYSTOSCOPY WITH RETROGRADE PYELOGRAM/ RIGHT URETERAL STENT PLACEMENT;  Surgeon: Ardis Hughs, MD;  Location: [facility];  Service: Urology;  Laterality: Right;   HYDRADENITIS EXCISION Bilateral 04/03/2017   Procedure: EXCISION AND DRAINAGE BILATERAL  HIDRADENITIS AXILLA;  Surgeon: Jovita Kussmaul, MD;  Location: [place];  Service: General;  Laterality: Bilateral;   INCISION AND DRAINAGE ABSCESS Right 07/20/2016   Procedure: INCISION AND DRAINAGE ABSCESS right axilla;  Surgeon: Coralie Keens, MD;  Location: [place];  Service: General;  Laterality: Right;   INTRAUTERINE DEVICE (IUD)  INSERTION  11/2016   "had the one in my left arm removed"   [place] Right 12/21/2016   Procedure: IRRIGATION AND DEBRIDEMENT RIGHT AXILLARY HIDRADENITIS;  Surgeon: Ileana Roup, MD;  Location: [place];  Service: General;  Laterality: Right;   IRRIGATION AND DEBRIDEMENT ABSCESS Left 01/08/2017   Procedure: IRRIGATION AND DEBRIDEMENT AXILLARY ABSCESS;  Surgeon: Jovita Kussmaul, MD;  Location: [place];  Service: General;  Laterality: Left;   NASAL  SEPTOPLASTY W/ TURBINOPLASTY Bilateral 02/25/2019   Procedure: NASAL SEPTOPLASTY WITH TURBINATE REDUCTION;  Surgeon: Izora Gala, MD;  Location: [place];  Service: ENT;  Laterality: Bilateral;   TONSILLECTOMY Bilateral 09/14/2013   Procedure: BILATERAL TONSILLECTOMY;  Surgeon: Izora Gala, MD;  Location: [place];  Service: ENT;  Laterality: Bilateral;   TYMPANOPLASTY Bilateral    "rebuilt eardrums"   WISDOM TOOTH EXTRACTION      Family Psychiatric History: Reviewed family psychiatric history from progress note from 08/04/2019.  Family History:  Family History  Problem Relation Age of Onset   Hypertension Mother    Hyperlipidemia Mother    Diabetes Mother    Heart attack Father 57   Parkinson's disease Maternal Grandmother    Dementia Maternal Grandmother    Diabetes Maternal Grandmother    Bell's palsy Maternal Grandmother    Alcohol abuse Paternal Uncle     Social History: Reviewed social history from progress note on 08/04/2019. Social History   Socioeconomic History   Marital status: Single    Spouse name: Not on file   Number of children: Not on file   Years of education: EMS/Fire training   Highest education level: Not on file  Occupational History   Not on file  Tobacco Use   Smoking status: Never   Smokeless tobacco: Never  Vaping Use   Vaping Use: Former  Substance and Sexual Activity   Alcohol use: Not Currently   Drug use: Not Currently    Comment: in high school   Sexual activity: Yes    Birth control/protection: Pill, Condom  Other Topics Concern   Not on file  Social History Narrative   02/11/19   From: the area   Living: with Mom currently, working to find her own place   Work: EMS/Fire      Family: good relationship with Mom and Grandma      Enjoys: spend time with friends/family, hunting, bomb fire      Exercise: every shift does training - 2-3 times a week   Diet: well rounded, meat heavy      Safety    Seat belts: Yes    Guns: Yes  and secure   Safe in relationships: Yes    Helmet: no    Social Determinants of Radio broadcast assistant Strain: Not on file  Food Insecurity: Not on file  Transportation Needs: Not on file  Physical Activity: Not on file  Stress: Not on file  Social Connections: Not on file    Allergies:  Allergies  Allergen Reactions   Other Hives    ABD pad causes rash Pt can only tolerate cloth tape Pt can only tolerate cloth tape   Shellfish Allergy Hives   Tapentadol Hives and Other (See Comments)    Pt can only tolerate cloth tape   Tape Hives and Other (See Comments)    Pt can only tolerate cloth tape   Ginger Hives   Morphine And Related     "  I become very angry"   Vancomycin Other (See Comments)    Burns her vein really bad and always causes them to blow.    Metabolic Disorder Labs: Lab Results  Component Value Date   HGBA1C 5.8 (A) 02/03/2021   No results found for: PROLACTIN Lab Results  Component Value Date   CHOL 195 07/11/2020   TRIG 224.0 (H) 07/11/2020   HDL 41.80 07/11/2020   CHOLHDL 5 07/11/2020   VLDL 44.8 (H) 07/11/2020   LDLCALC 119 (H) 05/18/2019   LDLCALC 116 (H) 12/03/2016   Lab Results  Component Value Date   TSH 0.62 02/14/2021   TSH 1.32 08/05/2019    Therapeutic Level Labs: No results found for: LITHIUM No results found for: VALPROATE No components found for:  CBMZ  Current Medications: Current Outpatient Medications  Medication Sig Dispense Refill   clindamycin (CLEOCIN) 300 MG capsule Take 300 mg by mouth 2 (two) times daily.     diclofenac (VOLTAREN) 75 MG EC tablet Take by mouth.     EPINEPHrine (EPIPEN 2-PAK) 0.3 mg/0.3 mL IJ SOAJ injection Inject 0.3 mg into the muscle as needed for anaphylaxis. 1 each 0   HYDROcodone-acetaminophen (NORCO/VICODIN) 5-325 MG tablet Take 1-2 tablets by mouth every 6 (six) hours as needed.     Iron-Vitamin C 100-250 MG TABS iron     methylphenidate (RITALIN) 20 MG  tablet Take 1 tablet (20 mg total) by mouth daily. 30 tablet 0   methylphenidate (RITALIN) 20 MG tablet Take 1 tablet (20 mg total) by mouth daily. 30 tablet 0   methylphenidate (RITALIN) 20 MG tablet Take 1 tablet (20 mg total) by mouth daily. 30 tablet 0   methylphenidate (RITALIN) 20 MG tablet Take 1 tablet (20 mg total) by mouth 2 (two) times daily. 30 tablet 0   methylphenidate (RITALIN) 20 MG tablet Take 10 mg by mouth daily.     montelukast (SINGULAIR) 10 MG tablet TAKE 1 TABLET BY MOUTH EVERYDAY AT BEDTIME 90 tablet 3   ondansetron (ZOFRAN) 4 MG tablet Take 1 tablet (4 mg total) by mouth every 6 (six) hours. 12 tablet 0   traZODone (DESYREL) 50 MG tablet Take 1 tablet (50 mg total) by mouth at bedtime. 30 tablet 0   triamcinolone ointment (KENALOG) 0.1 % APPLY TO AFFECTED AREA TWICE A DAY     ARIPiprazole (ABILIFY) 2 MG tablet Take 1 tablet (2 mg total) by mouth daily. 90 tablet 1   drospirenone-ethinyl estradiol (YAZ) 3-0.02 MG tablet [name] (28) 3 mg-0.02 mg tablet  TAKE 1 TABLET BY MOUTH EVERY DAY (Patient not taking: Reported on 03/27/2021)     fluconazole (DIFLUCAN) 150 MG tablet Take 150 mg by mouth once. (Patient not taking: Reported on 03/27/2021)     lamoTRIgine (LAMICTAL) 100 MG tablet Take 0.5 tablets (50 mg total) by mouth 2 (two) times daily. 90 tablet 1   No current facility-administered medications for this visit.     Musculoskeletal: Strength & Muscle Tone: within normal limits Gait & Station: normal Patient leans: N/A  Psychiatric Specialty Exam: Review of Systems  Psychiatric/Behavioral:  Positive for dysphoric mood and sleep disturbance.   All other systems reviewed and are negative.  Blood pressure 126/80, pulse (!) 125, temperature 97.9 F (36.6 C), temperature source Temporal, weight 180 lb 9.6 oz (81.9 kg).Body mass index is 34.12 kg/m.  General Appearance: Casual  Eye Contact:  Fair  Speech:  Clear and Coherent  Volume:  Normal  Mood:  Depressed   Affect:  Congruent  Thought Process:  Goal Directed and Descriptions of Associations: Intact  Orientation:  Full (Time, Place, and Person)  Thought Content: Logical   Suicidal Thoughts:  No  Homicidal Thoughts:  No  Memory:  Immediate;   Fair Recent;   Fair Remote;   Fair  Judgement:  Fair  Insight:  Fair  Psychomotor Activity:  Normal  Concentration:  Concentration: Fair and Attention Span: Fair  Recall:  [employer] of Knowledge: Fair  Language: Fair  Akathisia:  No  Handed:  Right  AIMS (if indicated): done, 0  Assets:  Communication Skills Desire for Improvement Housing Social Support  ADL's:  Intact  Cognition: WNL  Sleep:  Poor   Screenings: GAD-7    Flowsheet Row Office Visit from 03/27/2021 in [facility] Video Visit from 10/26/2020 in [facility] Visit from 10/07/2020 in [facility] Video Visit from 06/22/2020 in [facility] Visit from 01/20/2019 in [facility]  Total GAD-7 Score 3 3 10 4 3       PHQ2-9    [place] Visit from 03/27/2021 in [facility] Video Visit from 10/26/2020 in [facility] Visit from 10/07/2020 in [place] [facility] Video Visit from 06/22/2020 in [facility] Video Visit from 05/03/2020 in [facility]  PHQ-2 Total Score 4 1 2  0 1  PHQ-9 Total Score 14 4 9  -- --      [place] Visit from 03/27/2021 in [facility] Office Visit from 10/07/2020 in [facility] ED from 08/28/2020 in [place] No Risk No Risk No Risk        Assessment and Plan: Courtney [name] is a 31 year old Caucasian female, employed, lives in [place] has a history of ADHD,  migraine headaches, bipolar disorder type II was evaluated in office today.  Patient is currently struggling with lack of sleep hygiene as well as mood symptoms, will benefit from the following plan.  Plan Bipolar disorder type II, depressed with mixed features-unstable Likely due to sleep problems and lack of sleep hygiene. Continue Lamictal 100 mg p.o. daily. Hold Abilify with tachycardic.  Patient advised to follow up with primary care provider if she continues to be tachycardic. Trazodone 50 mg p.o. nightly  ADHD-stable Patient advised to hold returning if she is tachycardic, discussed the effect of stimulants on her heart rate.  Insomnia-unstable Patient advised to work on sleep hygiene. Start trazodone 50 mg p.o. nightly  Tachycardia-unspecified-unstable Patient had repeat heart rate done after her medications were sent to the pharmacy.  Patient continues to have elevated heart rate.  Patient was brought back into the session again and discussed with her to hold her Abilify as well as her Ritalin if heart rate continues to remain high and to follow up with her primary care provider for further management.   Follow-up in clinic in 2 weeks or sooner if needed.  This note was generated in part or whole with voice recognition software. Voice recognition is usually quite accurate but there are transcription errors that can and very often do occur. I apologize for any typographical errors that were not detected and corrected.     Ursula Alert, MD 03/28/2021, 8:13 AM

## 2021-03-27 NOTE — Patient Instructions (Signed)
Trazodone Tablets °What is this medication? °TRAZODONE (TRAZ oh done) treats depression. It increases the amount of serotonin in the brain, a hormone that helps regulate mood. °This medicine may be used for other purposes; ask your health care provider or pharmacist if you have questions. °COMMON BRAND NAME(S): Desyrel °What should I tell my care team before I take this medication? °They need to know if you have any of these conditions: °Attempted suicide or thinking about it °Bipolar disorder °Bleeding problems °Glaucoma °Heart disease, or previous heart attack °Irregular heart beat °Kidney or liver disease °Low levels of sodium in the blood °An unusual or allergic reaction to trazodone, other medications, foods, dyes or preservatives °Pregnant or trying to get pregnant °Breast-feeding °How should I use this medication? °Take this medication by mouth with a glass of water. Follow the directions on the prescription label. Take this medication shortly after a meal or a light snack. Take your medication at regular intervals. Do not take your medication more often than directed. Do not stop taking this medication suddenly except upon the advice of your care team. Stopping this medication too quickly may cause serious side effects or your condition may worsen. °A special MedGuide will be given to you by the pharmacist with each prescription and refill. Be sure to read this information carefully each time. °Talk to your care team regarding the use of this medication in children. Special care may be needed. °Overdosage: If you think you have taken too much of this medicine contact a poison control center or emergency room at once. °NOTE: This medicine is only for you. Do not share this medicine with others. °What if I miss a dose? °If you miss a dose, take it as soon as you can. If it is almost time for your next dose, take only that dose. Do not take double or extra doses. °What may interact with this medication? °Do not  take this medication with any of the following: °Certain medications for fungal infections like fluconazole, itraconazole, ketoconazole, posaconazole, voriconazole °Cisapride °Dronedarone °Linezolid °MAOIs like Carbex, Eldepryl, Marplan, Nardil, and Parnate °Mesoridazine °Methylene blue (injected into a vein) °Pimozide °Saquinavir °Thioridazine °This medication may also interact with the following: °Alcohol °Antiviral medications for HIV or AIDS °Aspirin and aspirin-like medications °Barbiturates like phenobarbital °Certain medications for blood pressure, heart disease, irregular heart beat °Certain medications for depression, anxiety, or psychotic disturbances °Certain medications for migraine headache like almotriptan, eletriptan, frovatriptan, naratriptan, rizatriptan, sumatriptan, zolmitriptan °Certain medications for seizures like carbamazepine and phenytoin °Certain medications for sleep °Certain medications that treat or prevent blood clots like dalteparin, enoxaparin, warfarin °Digoxin °Fentanyl °Lithium °NSAIDS, medications for pain and inflammation, like ibuprofen or naproxen °Other medications that prolong the QT interval (cause an abnormal heart rhythm) like dofetilide °Rasagiline °Supplements like St. John's wort, kava kava, valerian °Tramadol °Tryptophan °This list may not describe all possible interactions. Give your health care provider a list of all the medicines, herbs, non-prescription drugs, or dietary supplements you use. Also tell them if you smoke, drink alcohol, or use illegal drugs. Some items may interact with your medicine. °What should I watch for while using this medication? °Tell your care team if your symptoms do not get better or if they get worse. Visit your care team for regular checks on your progress. Because it may take several weeks to see the full effects of this medication, it is important to continue your treatment as prescribed by your care team. °Watch for new or worsening  thoughts of   suicide or depression. This includes sudden changes in mood, behaviors, or thoughts. These changes can happen at any time but are more common in the beginning of treatment or after a change in dose. Call your care team right away if you experience these thoughts or worsening depression. °Manic episodes may happen in patients with bipolar disorder who take this medication. Watch for changes in feelings or behaviors such as feeling anxious, nervous, agitated, panicky, irritable, hostile, aggressive, impulsive, severely restless, overly excited and hyperactive, or trouble sleeping. These changes can happen at any time but are more common in the beginning of treatment or after a change in dose. Call your care team right away if you notice any of these symptoms. °You may get drowsy or dizzy. Do not drive, use machinery, or do anything that needs mental alertness until you know how this medication affects you. Do not stand or sit up quickly, especially if you are an older patient. This reduces the risk of dizzy or fainting spells. Alcohol may interfere with the effect of this medication. Avoid alcoholic drinks. °This medication may cause dry eyes and blurred vision. If you wear contact lenses you may feel some discomfort. Lubricating drops may help. See your eye doctor if the problem does not go away or is severe. °Your mouth may get dry. Chewing sugarless gum, sucking hard candy and drinking plenty of water may help. Contact your care team if the problem does not go away or is severe. °What side effects may I notice from receiving this medication? °Side effects that you should report to your care team as soon as possible: °Allergic reactions--skin rash, itching, hives, swelling of the face, lips, tongue, or throat °Bleeding--bloody or black, tar-like stools, red or dark brown urine, vomiting blood or brown material that looks like coffee grounds, small, red or purple spots on skin, unusual bleeding or  bruising °Heart rhythm changes--fast or irregular heartbeat, dizziness, feeling faint or lightheaded, chest pain, trouble breathing °Low blood pressure--dizziness, feeling faint or lightheaded, blurry vision °Low sodium level--muscle weakness, fatigue, dizziness, headache, confusion °Prolonged or painful erection °Serotonin syndrome--irritability, confusion, fast or irregular heartbeat, muscle stiffness, twitching muscles, sweating, high fever, seizures, chills, vomiting, diarrhea °Sudden eye pain or change in vision such as blurry vision, seeing halos around lights, vision loss °Thoughts of suicide or self-harm, worsening mood, feelings of depression °Side effects that usually do not require medical attention (report to your care team if they continue or are bothersome): °Change in sex drive or performance °Constipation °Dizziness °Drowsiness °Dry mouth °This list may not describe all possible side effects. Call your doctor for medical advice about side effects. You may report side effects to FDA at 1-800-FDA-1088. °Where should I keep my medication? °Keep out of the reach of children and pets. °Store at room temperature between 15 and 30 degrees C (59 to 86 degrees F). Protect from light. Keep container tightly closed. Throw away any unused medication after the expiration date. °NOTE: This sheet is a summary. It may not cover all possible information. If you have questions about this medicine, talk to your doctor, pharmacist, or health care provider. °© 2022 [employer]vier/Gold Standard (2020-02-10 00:00:00) ° °

## 2021-04-03 ENCOUNTER — Other Ambulatory Visit (HOSPITAL_COMMUNITY): Payer: Self-pay

## 2021-04-03 ENCOUNTER — Ambulatory Visit: Admit: 2021-04-03 | Discharge: 2021-04-03 | Payer: PRIVATE HEALTH INSURANCE

## 2021-04-03 DIAGNOSIS — L732 Hidradenitis suppurativa: Principal | ICD-10-CM

## 2021-04-03 DIAGNOSIS — F411 Generalized anxiety disorder: Principal | ICD-10-CM

## 2021-04-03 MED ORDER — DIAZEPAM 10 MG TABLET
ORAL_TABLET | ORAL | 0 refills | 0.00000 days | Status: CP
Start: 2021-04-03 — End: ?

## 2021-04-03 MED ORDER — HYDROCODONE 5 MG-ACETAMINOPHEN 325 MG TABLET
ORAL_TABLET | ORAL | 0 refills | 0.00000 days | Status: CP
Start: 2021-04-03 — End: ?

## 2021-04-03 MED ORDER — CLINDAMYCIN HCL 300 MG CAPSULE
ORAL_CAPSULE | Freq: Two times a day (BID) | ORAL | 11 refills | 32.00000 days | Status: CP
Start: 2021-04-03 — End: ?

## 2021-04-04 DIAGNOSIS — L732 Hidradenitis suppurativa: Principal | ICD-10-CM

## 2021-04-04 MED ORDER — HYDROCODONE 5 MG-ACETAMINOPHEN 325 MG TABLET
ORAL_TABLET | ORAL | 0 refills | 0.00000 days | Status: CP
Start: 2021-04-04 — End: ?

## 2021-04-05 ENCOUNTER — Other Ambulatory Visit (HOSPITAL_COMMUNITY): Payer: Self-pay

## 2021-04-05 MED ORDER — HYDROCODONE-ACETAMINOPHEN 5-325 MG PO TABS
1.0000 | ORAL_TABLET | Freq: Four times a day (QID) | ORAL | 0 refills | Status: DC | PRN
  Filled 2021-04-05: qty 15, 2d supply, fill #0

## 2021-04-10 ENCOUNTER — Ambulatory Visit: Admit: 2021-04-10 | Discharge: 2021-04-11 | Payer: PRIVATE HEALTH INSURANCE

## 2021-04-10 DIAGNOSIS — L732 Hidradenitis suppurativa: Secondary | ICD-10-CM | POA: Diagnosis not present

## 2021-04-11 ENCOUNTER — Other Ambulatory Visit (HOSPITAL_COMMUNITY): Payer: Self-pay

## 2021-04-11 ENCOUNTER — Ambulatory Visit: Admit: 2021-04-11 | Discharge: 2021-04-11 | Payer: PRIVATE HEALTH INSURANCE

## 2021-04-11 DIAGNOSIS — L732 Hidradenitis suppurativa: Principal | ICD-10-CM

## 2021-04-11 DIAGNOSIS — Z885 Allergy status to narcotic agent status: Secondary | ICD-10-CM | POA: Diagnosis not present

## 2021-04-11 DIAGNOSIS — Z452 Encounter for adjustment and management of vascular access device: Secondary | ICD-10-CM | POA: Diagnosis not present

## 2021-04-12 DIAGNOSIS — L732 Hidradenitis suppurativa: Secondary | ICD-10-CM | POA: Diagnosis not present

## 2021-04-14 DIAGNOSIS — L732 Hidradenitis suppurativa: Secondary | ICD-10-CM | POA: Diagnosis not present

## 2021-04-15 DIAGNOSIS — L732 Hidradenitis suppurativa: Secondary | ICD-10-CM | POA: Diagnosis not present

## 2021-04-16 DIAGNOSIS — L732 Hidradenitis suppurativa: Secondary | ICD-10-CM | POA: Diagnosis not present

## 2021-04-17 DIAGNOSIS — L732 Hidradenitis suppurativa: Secondary | ICD-10-CM | POA: Diagnosis not present

## 2021-04-18 DIAGNOSIS — L732 Hidradenitis suppurativa: Secondary | ICD-10-CM | POA: Diagnosis not present

## 2021-04-19 DIAGNOSIS — L732 Hidradenitis suppurativa: Secondary | ICD-10-CM | POA: Diagnosis not present

## 2021-04-20 DIAGNOSIS — L732 Hidradenitis suppurativa: Secondary | ICD-10-CM | POA: Diagnosis not present

## 2021-04-22 DIAGNOSIS — L732 Hidradenitis suppurativa: Secondary | ICD-10-CM | POA: Diagnosis not present

## 2021-04-23 ENCOUNTER — Other Ambulatory Visit: Payer: Self-pay | Admitting: Psychiatry

## 2021-04-23 DIAGNOSIS — G4700 Insomnia, unspecified: Secondary | ICD-10-CM

## 2021-04-23 DIAGNOSIS — L732 Hidradenitis suppurativa: Secondary | ICD-10-CM | POA: Diagnosis not present

## 2021-04-24 ENCOUNTER — Ambulatory Visit: Payer: BC Managed Care – PPO | Admitting: Family Medicine

## 2021-04-24 DIAGNOSIS — L732 Hidradenitis suppurativa: Secondary | ICD-10-CM | POA: Diagnosis not present

## 2021-04-25 ENCOUNTER — Other Ambulatory Visit: Payer: Self-pay | Admitting: Family Medicine

## 2021-04-25 DIAGNOSIS — L732 Hidradenitis suppurativa: Secondary | ICD-10-CM | POA: Diagnosis not present

## 2021-04-26 DIAGNOSIS — L732 Hidradenitis suppurativa: Secondary | ICD-10-CM | POA: Diagnosis not present

## 2021-04-28 ENCOUNTER — Ambulatory Visit: Payer: BC Managed Care – PPO | Admitting: Dietician

## 2021-04-28 DIAGNOSIS — L732 Hidradenitis suppurativa: Secondary | ICD-10-CM | POA: Diagnosis not present

## 2021-04-29 DIAGNOSIS — L732 Hidradenitis suppurativa: Secondary | ICD-10-CM | POA: Diagnosis not present

## 2021-04-30 DIAGNOSIS — L732 Hidradenitis suppurativa: Secondary | ICD-10-CM | POA: Diagnosis not present

## 2021-05-01 DIAGNOSIS — L732 Hidradenitis suppurativa: Secondary | ICD-10-CM | POA: Diagnosis not present

## 2021-05-02 DIAGNOSIS — L732 Hidradenitis suppurativa: Secondary | ICD-10-CM | POA: Diagnosis not present

## 2021-05-03 DIAGNOSIS — L732 Hidradenitis suppurativa: Principal | ICD-10-CM

## 2021-05-04 DIAGNOSIS — L509 Urticaria, unspecified: Secondary | ICD-10-CM | POA: Diagnosis not present

## 2021-05-04 DIAGNOSIS — R21 Rash and other nonspecific skin eruption: Secondary | ICD-10-CM | POA: Diagnosis not present

## 2021-05-04 DIAGNOSIS — Z880 Allergy status to penicillin: Secondary | ICD-10-CM | POA: Diagnosis not present

## 2021-05-04 DIAGNOSIS — F909 Attention-deficit hyperactivity disorder, unspecified type: Secondary | ICD-10-CM | POA: Diagnosis not present

## 2021-05-04 DIAGNOSIS — Z881 Allergy status to other antibiotic agents status: Secondary | ICD-10-CM | POA: Diagnosis not present

## 2021-05-04 DIAGNOSIS — Z91013 Allergy to seafood: Secondary | ICD-10-CM | POA: Diagnosis not present

## 2021-05-04 DIAGNOSIS — Z885 Allergy status to narcotic agent status: Secondary | ICD-10-CM | POA: Diagnosis not present

## 2021-05-04 DIAGNOSIS — Z6835 Body mass index (BMI) 35.0-35.9, adult: Secondary | ICD-10-CM | POA: Diagnosis not present

## 2021-05-05 ENCOUNTER — Ambulatory Visit: Payer: BC Managed Care – PPO | Admitting: Endocrinology

## 2021-05-05 ENCOUNTER — Ambulatory Visit
Admit: 2021-05-05 | Discharge: 2021-05-05 | Disposition: A | Discharge status: Home / Self Care | Payer: PRIVATE HEALTH INSURANCE | Attending: Student in an Organized Health Care Education/Training Program

## 2021-05-05 MED ORDER — HYDROXYZINE HCL 25 MG TABLET
ORAL_TABLET | Freq: Three times a day (TID) | ORAL | 0 refills | 7 days | Status: CP | PRN
Start: 2021-05-05 — End: ?

## 2021-05-09 DIAGNOSIS — L732 Hidradenitis suppurativa: Secondary | ICD-10-CM | POA: Diagnosis not present

## 2021-05-10 ENCOUNTER — Ambulatory Visit: Admit: 2021-05-10 | Discharge: 2021-05-11 | Payer: PRIVATE HEALTH INSURANCE

## 2021-05-10 DIAGNOSIS — L732 Hidradenitis suppurativa: Secondary | ICD-10-CM | POA: Diagnosis not present

## 2021-05-11 ENCOUNTER — Ambulatory Visit: Payer: BC Managed Care – PPO | Admitting: Family Medicine

## 2021-05-11 DIAGNOSIS — L732 Hidradenitis suppurativa: Secondary | ICD-10-CM | POA: Diagnosis not present

## 2021-05-12 DIAGNOSIS — L732 Hidradenitis suppurativa: Secondary | ICD-10-CM | POA: Diagnosis not present

## 2021-05-13 DIAGNOSIS — L732 Hidradenitis suppurativa: Secondary | ICD-10-CM | POA: Diagnosis not present

## 2021-05-14 DIAGNOSIS — L732 Hidradenitis suppurativa: Secondary | ICD-10-CM | POA: Diagnosis not present

## 2021-05-15 DIAGNOSIS — L732 Hidradenitis suppurativa: Secondary | ICD-10-CM | POA: Diagnosis not present

## 2021-05-16 DIAGNOSIS — L732 Hidradenitis suppurativa: Secondary | ICD-10-CM | POA: Diagnosis not present

## 2021-05-17 DIAGNOSIS — L732 Hidradenitis suppurativa: Secondary | ICD-10-CM | POA: Diagnosis not present

## 2021-05-18 ENCOUNTER — Ambulatory Visit: Payer: BC Managed Care – PPO | Admitting: Endocrinology

## 2021-05-18 ENCOUNTER — Other Ambulatory Visit: Payer: Self-pay

## 2021-05-18 VITALS — BP 104/76 | HR 104 | Ht 61.0 in | Wt 188.4 lb

## 2021-05-18 DIAGNOSIS — E162 Hypoglycemia, unspecified: Secondary | ICD-10-CM

## 2021-05-18 LAB — POCT GLYCOSYLATED HEMOGLOBIN (HGB A1C): Hemoglobin A1C: 5.6 % (ref 4.0–5.6)

## 2021-05-18 MED ORDER — CONTOUR TEST VI STRP: ORAL_STRIP | 12 refills | Status: DC

## 2021-05-18 MED ORDER — CONTOUR MONITOR DEVI: 1.0000 | Freq: Once | 0 refills | Status: AC

## 2021-05-18 NOTE — Progress Notes (Signed)
? ?Subjective:  ? ? Patient ID: Courtney [name], female    DOB: 1990-08-10, 31 y.o.   MRN: [id number] ? ?HPI ?Pt returns for f/u of reactive hypoglycemia (dx'ed 2022: she has never had pancreatic imaging; A1c is high 5's).  Pt says lowest cbg since last ov was 60-70.  However, whenever cbg is below 100, she gets sxs of HA, nausea, heat intolerance, sweating, and nausea.  There is no relationship to meals or time of day.  She works as an Public relations account executive, 8AM-8PM, M-F.  She sometimes also works weekends.  She takes IV-ABX for hydradenitis.  These sxs (painful rash) are much better.   ? ?Past Surgical History:  ?Procedure Laterality Date  ? AXILLARY HIDRADENITIS EXCISION    ? AXILLARY LYMPH NODE BIOPSY Left 01/06/2019  ? CYSTOSCOPY W/ URETERAL STENT PLACEMENT Right 05/28/2017  ? Procedure: CYSTOSCOPY WITH RETROGRADE PYELOGRAM/ RIGHT URETERAL STENT PLACEMENT;  Surgeon: Ardis Hughs, MD;  Location: [facility];  Service: Urology;  Laterality: Right;  ? HYDRADENITIS EXCISION Bilateral 04/03/2017  ? Procedure: EXCISION AND DRAINAGE BILATERAL  HIDRADENITIS AXILLA;  Surgeon: Jovita Kussmaul, MD;  Location: [facility];  Service: General;  Laterality: Bilateral;  ? INCISION AND DRAINAGE ABSCESS Right 07/20/2016  ? Procedure: INCISION AND DRAINAGE ABSCESS right axilla;  Surgeon: Coralie Keens, MD;  Location: [facility];  Service: General;  Laterality: Right;  ? INTRAUTERINE DEVICE (IUD) INSERTION  11/2016  ? "had the one in my left arm removed"  ? IRRIGATION AND DEBRIDEMENT ABSCESS Right 12/21/2016  ? Procedure: IRRIGATION AND DEBRIDEMENT RIGHT AXILLARY HIDRADENITIS;  Surgeon: Ileana Roup, MD;  Location: [facility];  Service: General;  Laterality: Right;  ? IRRIGATION AND DEBRIDEMENT ABSCESS Left 01/08/2017  ? Procedure: IRRIGATION AND DEBRIDEMENT AXILLARY ABSCESS;  Surgeon: Jovita Kussmaul, MD;  Location: [facility];  Service: General;  Laterality: Left;  ? NASAL SEPTOPLASTY W/ TURBINOPLASTY Bilateral 02/25/2019  ? Procedure: NASAL SEPTOPLASTY WITH  TURBINATE REDUCTION;  Surgeon: Izora Gala, MD;  Location: [facility];  Service: ENT;  Laterality: Bilateral;  ? TONSILLECTOMY Bilateral 09/14/2013  ? Procedure: BILATERAL TONSILLECTOMY;  Surgeon: Izora Gala, MD;  Location: [facility];  Service: ENT;  Laterality: Bilateral;  ? TYMPANOPLASTY Bilateral   ? "rebuilt eardrums"  ? WISDOM TOOTH EXTRACTION    ? ? ?Social History  ? ?Socioeconomic History  ? Marital status: Single  ?  Spouse name: Not on file  ? Number of children: Not on file  ? Years of education: EMS/Fire training  ? Highest education level: Not on file  ?Occupational History  ? Not on file  ?Tobacco Use  ? Smoking status: Never  ? Smokeless tobacco: Never  ?Vaping Use  ? Vaping Use: Former  ?Substance and Sexual Activity  ? Alcohol use: Not Currently  ? Drug use: Not Currently  ?  Comment: in high school  ? Sexual activity: Yes  ?  Birth control/protection: Pill, Condom  ?Other Topics Concern  ? Not on file  ?Social History Narrative  ? 02/11/19  ? From: the area  ? Living: with Mom currently, working to find her own place  ? Work: EMS/Fire  ?   ? Family: good relationship with Mom and Grandma  ?   ? Enjoys: spend time with friends/family, hunting, bomb fire  ?   ? Exercise: every shift does training - 2-3 times a week  ? Diet: well rounded, meat heavy  ?   ? Safety  ? Seat belts: Yes   ?  Guns: Yes  and secure  ? Safe in relationships: Yes   ? Helmet: no   ? ?Social Determinants of Health  ? ?Financial Resource Strain: Not on file  ?Food Insecurity: Not on file  ?Transportation Needs: Not on file  ?Physical Activity: Not on file  ?Stress: Not on file  ?Social Connections: Not on file  ?Intimate Partner Violence: Not on file  ? ? ?Current Outpatient Medications on File Prior to Visit  ?Medication Sig Dispense Refill  ? ARIPiprazole (ABILIFY) 2 MG tablet Take 1 tablet (2 mg total) by mouth daily. 90 tablet 1  ? clindamycin (CLEOCIN) 300 MG capsule Take 300 mg by mouth 2  (two) times daily.    ? diclofenac (VOLTAREN) 75 MG EC tablet Take by mouth.    ? drospirenone-ethinyl estradiol (YAZ) 3-0.02 MG tablet     ? EPINEPHrine (EPIPEN 2-PAK) 0.3 mg/0.3 mL IJ SOAJ injection Inject 0.3 mg into the muscle as needed for anaphylaxis. 1 each 0  ? ERTAPENEM SODIUM IV Inject into the vein.    ? fluconazole (DIFLUCAN) 150 MG tablet Take 150 mg by mouth once.    ? HYDROcodone-acetaminophen (NORCO/VICODIN) 5-325 MG tablet Take 1-2 tablets by mouth every 6 (six) hours as needed.    ? HYDROcodone-acetaminophen (NORCO/VICODIN) 5-325 MG tablet Take 1-2 tablets by mouth every 6 hours as needed for pain 15 tablet 0  ? Iron-Vitamin C 100-250 MG TABS iron    ? lamoTRIgine (LAMICTAL) 100 MG tablet Take 0.5 tablets (50 mg total) by mouth 2 (two) times daily. 90 tablet 1  ? methylphenidate (RITALIN) 20 MG tablet Take 1 tablet (20 mg total) by mouth daily. 30 tablet 0  ? methylphenidate (RITALIN) 20 MG tablet Take 1 tablet (20 mg total) by mouth daily. 30 tablet 0  ? methylphenidate (RITALIN) 20 MG tablet Take 1 tablet (20 mg total) by mouth daily. 30 tablet 0  ? methylphenidate (RITALIN) 20 MG tablet Take 1 tablet (20 mg total) by mouth 2 (two) times daily. 30 tablet 0  ? methylphenidate (RITALIN) 20 MG tablet Take 10 mg by mouth daily.    ? montelukast (SINGULAIR) 10 MG tablet TAKE 1 TABLET BY MOUTH EVERYDAY AT BEDTIME 90 tablet 0  ? ondansetron (ZOFRAN) 4 MG tablet Take 1 tablet (4 mg total) by mouth every 6 (six) hours. 12 tablet 0  ? traZODone (DESYREL) 50 MG tablet TAKE 1 TABLET BY MOUTH EVERYDAY AT BEDTIME 30 tablet 0  ? triamcinolone ointment (KENALOG) 0.1 % APPLY TO AFFECTED AREA TWICE A DAY    ? ?No current facility-administered medications on file prior to visit.  ? ? ?Allergies  ?Allergen Reactions  ? Other Hives  ?  ABD pad causes rash ?Pt can only tolerate cloth tape ?Pt can only tolerate cloth tape  ? Shellfish Allergy Hives  ? Tapentadol Hives and Other (See Comments)  ?  Pt can only tolerate  cloth tape  ? Tape Hives and Other (See Comments)  ?  Pt can only tolerate cloth tape  ? Ginger Hives  ? Morphine And Related   ?  " I become very angry"  ? Vancomycin Other (See Comments)  ?  Burns her vein really bad and always causes them to blow.  ? ? ?Family History  ?Problem Relation Age of Onset  ? Hypertension Mother   ? Hyperlipidemia Mother   ? Diabetes Mother   ? Heart attack Father 4  ? Parkinson's disease Maternal Grandmother   ? Dementia Maternal Grandmother   ?  Diabetes Maternal Grandmother   ? Bell's palsy Maternal Grandmother   ? Alcohol abuse Paternal Uncle   ? ? ?BP 104/76   Pulse (!) 104   Ht 5\' 1"  (1.549 m)   Wt 188 lb 6.4 oz (85.5 kg)   SpO2 98%   BMI 35.60 kg/m?  ? ? ?Review of Systems ?Denies LOC.   ?   ?Objective:  ? Physical Exam ?VITAL SIGNS:  See vs page ?GENERAL: no distress ?PSYCH: Alert and well-oriented.  Does not appear anxious nor depressed.   ? ? ?A1c=5.6% ?   ?Assessment & Plan:  ?Reactive hypoglycemia: uncontrolled.  ? ?Patient Instructions  ?please reschedule your appointment with the dietician.   ?I have sent a prescription to your pharmacy, for a blood sugar meter and strips ?Your condition is a risk factor for diabetes, so you should have the A1c checked at least twice per year.   ? ? ? ?

## 2021-05-18 NOTE — Patient Instructions (Addendum)
please reschedule your appointment with the dietician.   ?I have sent a prescription to your pharmacy, for a blood sugar meter and strips ?Your condition is a risk factor for diabetes, so you should have the A1c checked at least twice per year.   ? ?

## 2021-05-19 DIAGNOSIS — L732 Hidradenitis suppurativa: Secondary | ICD-10-CM | POA: Diagnosis not present

## 2021-05-20 DIAGNOSIS — L732 Hidradenitis suppurativa: Secondary | ICD-10-CM | POA: Diagnosis not present

## 2021-05-21 DIAGNOSIS — L732 Hidradenitis suppurativa: Secondary | ICD-10-CM | POA: Diagnosis not present

## 2021-05-22 DIAGNOSIS — L732 Hidradenitis suppurativa: Secondary | ICD-10-CM | POA: Diagnosis not present

## 2021-05-23 ENCOUNTER — Other Ambulatory Visit: Payer: Self-pay | Admitting: Psychiatry

## 2021-05-23 DIAGNOSIS — G4700 Insomnia, unspecified: Secondary | ICD-10-CM

## 2021-05-23 DIAGNOSIS — L732 Hidradenitis suppurativa: Secondary | ICD-10-CM | POA: Diagnosis not present

## 2021-05-24 DIAGNOSIS — L732 Hidradenitis suppurativa: Secondary | ICD-10-CM | POA: Diagnosis not present

## 2021-05-25 ENCOUNTER — Other Ambulatory Visit: Payer: Self-pay

## 2021-05-25 ENCOUNTER — Ambulatory Visit (INDEPENDENT_AMBULATORY_CARE_PROVIDER_SITE_OTHER): Payer: BC Managed Care – PPO | Admitting: Family Medicine

## 2021-05-25 VITALS — BP 98/62 | HR 108 | Temp 98.1°F | Ht 61.0 in | Wt 185.6 lb

## 2021-05-25 DIAGNOSIS — F901 Attention-deficit hyperactivity disorder, predominantly hyperactive type: Secondary | ICD-10-CM | POA: Diagnosis not present

## 2021-05-25 DIAGNOSIS — Z91048 Other nonmedicinal substance allergy status: Secondary | ICD-10-CM

## 2021-05-25 DIAGNOSIS — R110 Nausea: Secondary | ICD-10-CM | POA: Diagnosis not present

## 2021-05-25 DIAGNOSIS — L732 Hidradenitis suppurativa: Secondary | ICD-10-CM

## 2021-05-25 DIAGNOSIS — R7303 Prediabetes: Secondary | ICD-10-CM | POA: Insufficient documentation

## 2021-05-25 MED ORDER — HYDROXYZINE HCL 25 MG PO TABS: 25.0000 mg | ORAL_TABLET | Freq: Three times a day (TID) | ORAL | 1 refills | Status: DC | PRN

## 2021-05-25 MED ORDER — METHYLPHENIDATE HCL 20 MG PO TABS: 20.0000 mg | ORAL_TABLET | Freq: Every day | ORAL | 0 refills | Status: DC

## 2021-05-25 MED ORDER — ONDANSETRON HCL 4 MG PO TABS: 4.0000 mg | ORAL_TABLET | Freq: Four times a day (QID) | ORAL | 0 refills | Status: DC

## 2021-05-25 NOTE — Assessment & Plan Note (Signed)
Secondary to her PICC line adhesive dressing.  Responding to hydroxyzine, refill provided.  She will be getting IV infusion through May. ?

## 2021-05-25 NOTE — Assessment & Plan Note (Signed)
Continues to do well on Ritalin 20 mg daily.  Rare use of 10 mg in the afternoon of Ritalin.  Refill x3 months provided. ?

## 2021-05-25 NOTE — Progress Notes (Signed)
? ?Subjective:  ? ?  ?Courtney [name] is a 31 y.o. female presenting for Follow-up (4 mo ADHD) and Medication Refill ?  ? ? ?Medication Refill ? ? ?#HS ?- ertapenem infusion ?- follows with derm ?- pic line in place ? ?#irritant dermatitis ?- hydroxyzine prn ? ?#ADHD ?- going well ?- no concerns  ?- rare use of the afternoon dose ?- doing well at work ? ?#prediabetes/hypoglycemia ?- following with endocrine ?- wants to see another provider ?- working on eating regularly  ? ? ?Review of Systems ? ? ?Social History  ? ?Tobacco Use  ?Smoking Status Never  ?Smokeless Tobacco Never  ? ? ? ?   ?Objective:  ?  ?BP Readings from Last 3 Encounters:  ?05/25/21 98/62  ?05/18/21 104/76  ?02/03/21 100/70  ? ?Wt Readings from Last 3 Encounters:  ?05/25/21 185 lb 9 oz (84.2 kg)  ?05/18/21 188 lb 6.4 oz (85.5 kg)  ?02/03/21 178 lb 6.4 oz (80.9 kg)  ? ? ?BP 98/62   Pulse (!) 108   Temp 98.1 ?F (36.7 ?C) (Oral)   Ht 5\' 1"  (1.549 m)   Wt 185 lb 9 oz (84.2 kg)   SpO2 99%   BMI 35.06 kg/m?  ? ? ?Physical Exam ?Constitutional:   ?   General: She is not in acute distress. ?   Appearance: She is well-developed. She is not diaphoretic.  ?HENT:  ?   Right Ear: External ear normal.  ?   Left Ear: External ear normal.  ?   Nose: Nose normal.  ?Eyes:  ?   Conjunctiva/sclera: Conjunctivae normal.  ?Cardiovascular:  ?   Rate and Rhythm: Normal rate and regular rhythm.  ?   Heart sounds: No murmur heard. ?Pulmonary:  ?   Effort: Pulmonary effort is normal. No respiratory distress.  ?   Breath sounds: Normal breath sounds. No wheezing.  ?Musculoskeletal:  ?   Cervical back: Neck supple.  ?Skin: ?   General: Skin is warm and dry.  ?   Capillary Refill: Capillary refill takes less than 2 seconds.  ?   Comments: Picc line in place with erythema surrounding the adhesive dressing  ?Neurological:  ?   Mental Status: She is alert. Mental status is at baseline.  ?Psychiatric:     ?   Mood and Affect: Mood normal.     ?   Behavior: Behavior normal.   ? ? ? ? ? ?   ?Assessment & Plan:  ? ?Problem List Items Addressed This Visit   ? ?  ? Musculoskeletal and Integument  ? Axillary hidradenitis suppurativa  ?  Following with [facility] dermatology.  Currently on ertapenem through her PICC line.  Patient notes good improvement in symptoms. ?  ?  ?  ? Other  ? Attention deficit hyperactivity disorder (ADHD), predominantly hyperactive type - Primary  ?  Continues to do well on Ritalin 20 mg daily.  Rare use of 10 mg in the afternoon of Ritalin.  Refill x3 months provided. ?  ?  ? Relevant Medications  ? methylphenidate (RITALIN) 20 MG tablet (Start on 06/23/2021)  ? methylphenidate (RITALIN) 20 MG tablet (Start on 07/24/2021)  ? methylphenidate (RITALIN) 20 MG tablet  ? Nausea  ?  Response to as needed Zofran 4mg  refilled today. ?  ?  ? Relevant Medications  ? ondansetron (ZOFRAN) 4 MG tablet  ? Allergy to adhesive tape  ?  Secondary to her PICC line adhesive dressing.  Responding to hydroxyzine, refill  provided.  She will be getting IV infusion through May. ?  ?  ? Relevant Medications  ? hydrOXYzine (ATARAX) 25 MG tablet  ? ? ? ?Return in about 3 months (around 08/25/2021) for adhd. ? ?Lynnda Child, MD ? ?This visit occurred during the SARS-CoV-2 public health emergency.  Safety protocols were in place, including screening questions prior to the visit, additional usage of staff PPE, and extensive cleaning of exam room while observing appropriate contact time as indicated for disinfecting solutions.  ? ?

## 2021-05-25 NOTE — Assessment & Plan Note (Signed)
Response to as needed Zofran 4mg  refilled today. ?

## 2021-05-25 NOTE — Assessment & Plan Note (Signed)
Following with [facility] dermatology.  Currently on ertapenem through her PICC line.  Patient notes good improvement in symptoms. ?

## 2021-05-26 DIAGNOSIS — L732 Hidradenitis suppurativa: Secondary | ICD-10-CM | POA: Diagnosis not present

## 2021-05-27 DIAGNOSIS — L732 Hidradenitis suppurativa: Secondary | ICD-10-CM | POA: Diagnosis not present

## 2021-05-28 DIAGNOSIS — L732 Hidradenitis suppurativa: Secondary | ICD-10-CM | POA: Diagnosis not present

## 2021-05-29 DIAGNOSIS — L732 Hidradenitis suppurativa: Secondary | ICD-10-CM | POA: Diagnosis not present

## 2021-05-30 DIAGNOSIS — L732 Hidradenitis suppurativa: Secondary | ICD-10-CM | POA: Diagnosis not present

## 2021-05-31 ENCOUNTER — Encounter: Payer: Self-pay | Admitting: Family Medicine

## 2021-06-01 DIAGNOSIS — F909 Attention-deficit hyperactivity disorder, unspecified type: Secondary | ICD-10-CM | POA: Diagnosis not present

## 2021-06-01 DIAGNOSIS — Z6835 Body mass index (BMI) 35.0-35.9, adult: Secondary | ICD-10-CM | POA: Diagnosis not present

## 2021-06-01 DIAGNOSIS — R21 Rash and other nonspecific skin eruption: Secondary | ICD-10-CM | POA: Diagnosis not present

## 2021-06-01 DIAGNOSIS — Z79899 Other long term (current) drug therapy: Secondary | ICD-10-CM | POA: Diagnosis not present

## 2021-06-01 DIAGNOSIS — R519 Headache, unspecified: Secondary | ICD-10-CM | POA: Diagnosis not present

## 2021-06-01 DIAGNOSIS — Z885 Allergy status to narcotic agent status: Secondary | ICD-10-CM | POA: Diagnosis not present

## 2021-06-01 DIAGNOSIS — R300 Dysuria: Secondary | ICD-10-CM | POA: Diagnosis not present

## 2021-06-01 DIAGNOSIS — L259 Unspecified contact dermatitis, unspecified cause: Secondary | ICD-10-CM | POA: Diagnosis not present

## 2021-06-02 ENCOUNTER — Ambulatory Visit: Payer: PRIVATE HEALTH INSURANCE

## 2021-06-02 DIAGNOSIS — L732 Hidradenitis suppurativa: Principal | ICD-10-CM

## 2021-06-02 MED ORDER — PREDNISONE 20 MG TABLET
ORAL_TABLET | 3 refills | 0 days | Status: CP
Start: 2021-06-02 — End: ?

## 2021-06-02 MED ORDER — HYDROCODONE 5 MG-ACETAMINOPHEN 325 MG TABLET
ORAL_TABLET | 0 refills | 0 days | Status: CP
Start: 2021-06-02 — End: ?

## 2021-06-05 ENCOUNTER — Ambulatory Visit: Admit: 2021-06-05 | Discharge: 2021-06-06 | Payer: PRIVATE HEALTH INSURANCE

## 2021-06-05 DIAGNOSIS — L732 Hidradenitis suppurativa: Principal | ICD-10-CM

## 2021-06-06 DIAGNOSIS — L732 Hidradenitis suppurativa: Secondary | ICD-10-CM | POA: Diagnosis not present

## 2021-06-07 DIAGNOSIS — L732 Hidradenitis suppurativa: Secondary | ICD-10-CM | POA: Diagnosis not present

## 2021-06-09 DIAGNOSIS — L732 Hidradenitis suppurativa: Secondary | ICD-10-CM | POA: Diagnosis not present

## 2021-06-10 DIAGNOSIS — L732 Hidradenitis suppurativa: Secondary | ICD-10-CM | POA: Diagnosis not present

## 2021-06-11 DIAGNOSIS — L732 Hidradenitis suppurativa: Secondary | ICD-10-CM | POA: Diagnosis not present

## 2021-06-12 DIAGNOSIS — L732 Hidradenitis suppurativa: Secondary | ICD-10-CM | POA: Diagnosis not present

## 2021-06-13 DIAGNOSIS — L732 Hidradenitis suppurativa: Secondary | ICD-10-CM | POA: Diagnosis not present

## 2021-06-14 DIAGNOSIS — L732 Hidradenitis suppurativa: Secondary | ICD-10-CM | POA: Diagnosis not present

## 2021-06-18 ENCOUNTER — Other Ambulatory Visit: Payer: Self-pay | Admitting: Psychiatry

## 2021-06-18 ENCOUNTER — Other Ambulatory Visit: Payer: Self-pay | Admitting: Family Medicine

## 2021-06-18 DIAGNOSIS — Z91048 Other nonmedicinal substance allergy status: Secondary | ICD-10-CM

## 2021-06-18 DIAGNOSIS — G4700 Insomnia, unspecified: Secondary | ICD-10-CM

## 2021-06-19 ENCOUNTER — Ambulatory Visit: Payer: PRIVATE HEALTH INSURANCE

## 2021-06-19 DIAGNOSIS — T82898D Other specified complication of vascular prosthetic devices, implants and grafts, subsequent encounter: Principal | ICD-10-CM

## 2021-06-19 DIAGNOSIS — T82898A Other specified complication of vascular prosthetic devices, implants and grafts, initial encounter: Secondary | ICD-10-CM | POA: Diagnosis not present

## 2021-06-19 DIAGNOSIS — R21 Rash and other nonspecific skin eruption: Secondary | ICD-10-CM | POA: Diagnosis not present

## 2021-06-19 DIAGNOSIS — Y849 Medical procedure, unspecified as the cause of abnormal reaction of the patient, or of later complication, without mention of misadventure at the time of the procedure: Secondary | ICD-10-CM | POA: Diagnosis not present

## 2021-06-20 DIAGNOSIS — L732 Hidradenitis suppurativa: Secondary | ICD-10-CM | POA: Diagnosis not present

## 2021-06-21 DIAGNOSIS — Z886 Allergy status to analgesic agent status: Secondary | ICD-10-CM | POA: Diagnosis not present

## 2021-06-21 DIAGNOSIS — L732 Hidradenitis suppurativa: Secondary | ICD-10-CM | POA: Diagnosis not present

## 2021-06-21 DIAGNOSIS — F909 Attention-deficit hyperactivity disorder, unspecified type: Secondary | ICD-10-CM | POA: Diagnosis not present

## 2021-06-21 DIAGNOSIS — Z95828 Presence of other vascular implants and grafts: Secondary | ICD-10-CM | POA: Diagnosis not present

## 2021-06-21 DIAGNOSIS — Z91018 Allergy to other foods: Secondary | ICD-10-CM | POA: Diagnosis not present

## 2021-06-21 DIAGNOSIS — Z452 Encounter for adjustment and management of vascular access device: Secondary | ICD-10-CM | POA: Diagnosis not present

## 2021-06-21 DIAGNOSIS — Z91048 Other nonmedicinal substance allergy status: Secondary | ICD-10-CM | POA: Diagnosis not present

## 2021-06-21 DIAGNOSIS — Z881 Allergy status to other antibiotic agents status: Secondary | ICD-10-CM | POA: Diagnosis not present

## 2021-06-21 DIAGNOSIS — Z79899 Other long term (current) drug therapy: Secondary | ICD-10-CM | POA: Diagnosis not present

## 2021-06-21 DIAGNOSIS — Z91013 Allergy to seafood: Secondary | ICD-10-CM | POA: Diagnosis not present

## 2021-06-21 DIAGNOSIS — Z885 Allergy status to narcotic agent status: Secondary | ICD-10-CM | POA: Diagnosis not present

## 2021-06-22 ENCOUNTER — Ambulatory Visit: Payer: PRIVATE HEALTH INSURANCE

## 2021-06-22 ENCOUNTER — Emergency Department: Payer: PRIVATE HEALTH INSURANCE

## 2021-06-22 DIAGNOSIS — Z452 Encounter for adjustment and management of vascular access device: Secondary | ICD-10-CM | POA: Diagnosis not present

## 2021-06-26 ENCOUNTER — Ambulatory Visit: Admit: 2021-06-26 | Discharge: 2021-06-27 | Payer: PRIVATE HEALTH INSURANCE

## 2021-06-26 DIAGNOSIS — D492 Neoplasm of unspecified behavior of bone, soft tissue, and skin: Secondary | ICD-10-CM | POA: Diagnosis not present

## 2021-06-26 DIAGNOSIS — L732 Hidradenitis suppurativa: Secondary | ICD-10-CM | POA: Diagnosis not present

## 2021-06-26 DIAGNOSIS — F411 Generalized anxiety disorder: Secondary | ICD-10-CM | POA: Diagnosis not present

## 2021-06-26 MED ORDER — DIAZEPAM 10 MG TABLET
ORAL_TABLET | ORAL | 0 refills | 0.00000 days | Status: CP
Start: 2021-06-26 — End: ?

## 2021-06-26 MED ORDER — SULFAMETHOXAZOLE 800 MG-TRIMETHOPRIM 160 MG TABLET
ORAL_TABLET | Freq: Two times a day (BID) | ORAL | 3 refills | 30 days | Status: CP
Start: 2021-06-26 — End: ?

## 2021-06-26 MED ORDER — HYDROCODONE 5 MG-ACETAMINOPHEN 325 MG TABLET
ORAL_TABLET | ORAL | 0 refills | 0.00000 days | Status: CP
Start: 2021-06-26 — End: ?

## 2021-06-27 DIAGNOSIS — L732 Hidradenitis suppurativa: Principal | ICD-10-CM

## 2021-06-27 MED ORDER — HYDROCODONE 5 MG-ACETAMINOPHEN 325 MG TABLET
ORAL_TABLET | ORAL | 0 refills | 0.00000 days | Status: CP
Start: 2021-06-27 — End: ?

## 2021-06-28 ENCOUNTER — Ambulatory Visit (INDEPENDENT_AMBULATORY_CARE_PROVIDER_SITE_OTHER): Payer: BC Managed Care – PPO | Admitting: Psychiatry

## 2021-06-28 ENCOUNTER — Encounter: Payer: Self-pay | Admitting: Psychiatry

## 2021-06-28 ENCOUNTER — Other Ambulatory Visit (HOSPITAL_COMMUNITY): Payer: Self-pay

## 2021-06-28 VITALS — BP 125/83 | HR 91 | Temp 98.1°F | Wt 206.8 lb

## 2021-06-28 DIAGNOSIS — F901 Attention-deficit hyperactivity disorder, predominantly hyperactive type: Secondary | ICD-10-CM | POA: Diagnosis not present

## 2021-06-28 DIAGNOSIS — F3181 Bipolar II disorder: Secondary | ICD-10-CM | POA: Diagnosis not present

## 2021-06-28 DIAGNOSIS — G4701 Insomnia due to medical condition: Secondary | ICD-10-CM

## 2021-06-28 DIAGNOSIS — F419 Anxiety disorder, unspecified: Secondary | ICD-10-CM | POA: Diagnosis not present

## 2021-06-28 DIAGNOSIS — L732 Hidradenitis suppurativa: Secondary | ICD-10-CM | POA: Diagnosis not present

## 2021-06-28 DIAGNOSIS — F411 Generalized anxiety disorder: Secondary | ICD-10-CM | POA: Insufficient documentation

## 2021-06-28 MED ORDER — BUSPIRONE HCL 10 MG PO TABS: 10.0000 mg | ORAL_TABLET | Freq: Two times a day (BID) | ORAL | 1 refills | Status: DC

## 2021-06-28 MED ORDER — HYDROCODONE-ACETAMINOPHEN 5-325 MG PO TABS
1.0000 | ORAL_TABLET | Freq: Four times a day (QID) | ORAL | 0 refills | Status: DC | PRN
Start: 2021-06-27 — End: 2021-07-06
  Filled 2021-06-28: qty 15, 2d supply, fill #0

## 2021-06-28 NOTE — Patient Instructions (Addendum)
www.[email or web address].org ? ?www.psychologytoday ? ?Prednisone Tablets ?What is this medication? ?PREDNISONE (PRED ni sone) treats many conditions such as asthma, allergic reactions, arthritis, inflammatory bowel diseases, adrenal, and blood or bone marrow disorders. It works by decreasing inflammation, slowing down an overactive immune system, or replacing cortisol normally made in the body. Cortisol is a hormone that plays an important role in how the body responds to stress, illness, and injury. It belongs to a group of medications called steroids. ?This medicine may be used for other purposes; ask your health care provider or pharmacist if you have questions. ?COMMON BRAND NAME(S): Deltasone, Predone, Sterapred, Sterapred DS ?What should I tell my care team before I take this medication? ?They need to know if you have any of these conditions: ?Cushing's syndrome ?Diabetes ?Glaucoma ?Heart disease ?High blood pressure ?Infection (especially a virus infection such as chickenpox, cold sores, or herpes) ?Kidney disease ?Liver disease ?Mental illness ?Myasthenia gravis ?Osteoporosis ?Seizures ?Stomach or intestine problems ?Thyroid disease ?An unusual or allergic reaction to lactose, prednisone, other medications, foods, dyes, or preservatives ?Pregnant or trying to get pregnant ?Breast-feeding ?How should I use this medication? ?Take this medication by mouth with a glass of water. Follow the directions on the prescription label. Take this medication with food. If you are taking this medication once a day, take it in the morning. Do not take more medication than you are told to take. Do not suddenly stop taking your medication because you may develop a severe reaction. Your care team will tell you how much medication to take. If your care team wants you to stop the medication, the dose may be slowly lowered over time to avoid any side effects. ?Talk to your care team about the use of this medication in children.  Special care may be needed. ?Overdosage: If you think you have taken too much of this medicine contact a poison control center or emergency room at once. ?NOTE: This medicine is only for you. Do not share this medicine with others. ?What if I miss a dose? ?If you miss a dose, take it as soon as you can. If it is almost time for your next dose, talk to your care team. You may need to miss a dose or take an extra dose. Do not take double or extra doses without advice. ?What may interact with this medication? ?Do not take this medication with any of the following: ?Metyrapone ?Mifepristone ?This medication may also interact with the following: ?Aminoglutethimide ?Amphotericin B ?Aspirin and aspirin-like medications ?Barbiturates ?Certain medications for diabetes, like glipizide or glyburide ?Cholestyramine ?Cholinesterase inhibitors ?Cyclosporine ?Digoxin ?Diuretics ?Ephedrine ?Female hormones, like estrogens and birth control pills ?Isoniazid ?Ketoconazole ?NSAIDS, medications for pain and inflammation, like ibuprofen or naproxen ?Phenytoin ?Rifampin ?Toxoids ?Vaccines ?Warfarin ?This list may not describe all possible interactions. Give your health care provider a list of all the medicines, herbs, non-prescription drugs, or dietary supplements you use. Also tell them if you smoke, drink alcohol, or use illegal drugs. Some items may interact with your medicine. ?What should I watch for while using this medication? ?Visit your care team for regular checks on your progress. If you are taking this medication over a prolonged period, carry an identification card with your name and address, the type and dose of your medication, and your care team's name and address. ?This medication may increase your risk of getting an infection. Tell your care team if you are around anyone with measles or chickenpox, or if you develop sores or blisters that  do not heal properly. ?If you are going to have surgery, tell your care team that  you have taken this medication within the last twelve months. ?Ask your care team about your diet. You may need to lower the amount of salt you eat. ?This medication may increase blood sugar. Ask your care team if changes in diet or medications are needed if you have diabetes. ?What side effects may I notice from receiving this medication? ?Side effects that you should report to your care team as soon as possible: ?Allergic reactions--skin rash, itching, hives, swelling of the face, lips, tongue, or throat ?Cushing syndrome--increased fat around the midsection, upper back, neck, or face, pink or purple stretch marks on the skin, thinning, fragile skin that easily bruises, unexpected hair growth ?High blood sugar (hyperglycemia)--increased thirst or amount of urine, unusual weakness or fatigue, blurry vision ?Increase in blood pressure ?Infection--fever, chills, cough, sore throat, wounds that don't heal, pain or trouble when passing urine, general feeling of discomfort or being unwell ?Low adrenal gland function--nausea, vomiting, loss of appetite, unusual weakness or fatigue, dizziness ?Mood and behavior changes--anxiety, nervousness, confusion, hallucinations, irritability, hostility, thoughts of suicide or self-harm, worsening mood, feelings of depression ?Stomach bleeding--bloody or black, tar-like stools, vomiting blood or brown material that looks like coffee grounds ?Swelling of the ankles, hands, or feet ?Side effects that usually do not require medical attention (report to your care team if they continue or are bothersome): ?Acne ?General discomfort and fatigue ?Headache ?Increase in appetite ?Nausea ?Trouble sleeping ?Weight gain ?This list may not describe all possible side effects. Call your doctor for medical advice about side effects. You may report side effects to FDA at 1-800-FDA-1088. ?Where should I keep my medication? ?Keep out of the reach of children. ?Store at room temperature between 15 and 30  degrees C (59 and 86 degrees F). Protect from light. Keep container tightly closed. Throw away any unused medication after the expiration date. ?NOTE: This sheet is a summary. It may not cover all possible information. If you have questions about this medicine, talk to your doctor, pharmacist, or health care provider. ?? 2023 Elsevier/Gold Standard (2020-05-20 00:00:00) ?Buspirone Tablets ?What is this medication? ?BUSPIRONE (byoo SPYE rone) treats anxiety. It works by balancing the levels of dopamine and serotonin in your brain, hormones that help regulate mood. ?This medicine may be used for other purposes; ask your health care provider or pharmacist if you have questions. ?COMMON BRAND NAME(S): BuSpar, Buspar Dividose ?What should I tell my care team before I take this medication? ?They need to know if you have any of these conditions: ?Kidney or liver disease ?An unusual or allergic reaction to buspirone, other medications, foods, dyes, or preservatives ?Pregnant or trying to get pregnant ?Breast-feeding ?How should I use this medication? ?Take this medication by mouth with a glass of water. Follow the directions on the prescription label. You may take this medication with or without food. To ensure that this medication always works the same way for you, you should take it either always with or always without food. Take your doses at regular intervals. Do not take your medication more often than directed. Do not stop taking except on the advice of your care team. ?Talk to your care team about the use of this medication in children. Special care may be needed. ?Overdosage: If you think you have taken too much of this medicine contact a poison control center or emergency room at once. ?NOTE: This medicine is only for you.  Do not share this medicine with others. ?What if I miss a dose? ?If you miss a dose, take it as soon as you can. If it is almost time for your next dose, take only that dose. Do not take double  or extra doses. ?What may interact with this medication? ?Do not take this medication with any of the following: ?Linezolid ?MAOIs like Carbex, Eldepryl, Marplan, Nardil, and Parnate ?Methylene blue ?Pro

## 2021-06-28 NOTE — Progress Notes (Signed)
[facility] [facility] MD OP Progress Note ? ?06/28/2021 11:04 AM ?Courtney [name]  ?MRN:  [id number] ? ?Chief Complaint:  ?Chief Complaint  ?Patient presents with  ? Follow-up: 31 year old female with history of bipolar disorder type II, ADHD, presenting with worsening anxiety symptoms likely exacerbated by prednisone therapy as well as multiple medical problems.  ? ?HPI: Courtney [name] is a 31 year old Caucasian female, single, lives in [place], has a history of bipolar disorder type II, ADHD, migraine headaches, hidradenitis  was evaluated in office today. ? ?Patient today reports she has been struggling with acute exacerbation of hidradenitis suppurativa, currently has a PICC line in place and is on antibiotics.  She was also started on steroid therapy 3 weeks ago.  She  had to go to the emergency department a few times for the same.  Currently she is on FMLA, until this results. ? ?Patient reports she also had a procedure done at dermatology-completed additional daily roofing procedure of her right lower axilla-last Monday.  She also reports she will need another procedure completed on May 8. ? ?Patient reports she has been struggling with a lot since the past few weeks.  Her anxiety level has increased.  Patient reports she has been nervous, restless, anxious and has trouble concentrating.  She does not know what is making it worse. ? ?Patient reports sleep is good. ? ?She does feel sad about her current situational stressors. ? ?Denies suicidality, homicidality or perceptual disturbances. ? ?Currently compliant on medications.  Denies side effects. ? ?Denies any other concerns today. ? ?Visit Diagnosis:  ?  ICD-10-CM   ?1. Bipolar 2 disorder, major depressive episode (HCC)  F31.81 busPIRone (BUSPAR) 10 MG tablet  ? mild, mixed features  ?  ?2. Insomnia due to medical condition  G47.01   ? mood  ?  ?3. Anxiety disorder, unspecified type  F41.9 busPIRone (BUSPAR) 10 MG tablet  ?  ?4. Attention deficit hyperactivity disorder  (ADHD), predominantly hyperactive type  F90.1   ?  ? ? ?Past Psychiatric History: Reviewed past psychiatric history from progress note on 08/04/2019.  Past trials of Ritalin, Daytrana patch, Adderall, risperidone, Concerta. ? ?Past Medical History:  ?Past Medical History:  ?Diagnosis Date  ? ADHD (attention deficit hyperactivity disorder)   ? Bipolar disorder ([place])   ? Chronic tonsillitis 08/2013  ? current strep, will finish antibiotic 09/04/2013; snores during sleep, mother denies apnea  ? Family history of adverse reaction to anesthesia   ? " My cousin had problems waking up. "  ? Heart murmur   ? "when I was born; outgrew it; I was a preemie"  ? Hidradenitis   ? Hidradenitis 2017  ? Migraine 12/19/2016  ? "I've just had this one for 3 days" (12/20/2016)  ?  ?Past Surgical History:  ?Procedure Laterality Date  ? AXILLARY HIDRADENITIS EXCISION    ? AXILLARY LYMPH NODE BIOPSY Left 01/06/2019  ? CYSTOSCOPY W/ URETERAL STENT PLACEMENT Right 05/28/2017  ? Procedure: CYSTOSCOPY WITH RETROGRADE PYELOGRAM/ RIGHT URETERAL STENT PLACEMENT;  Surgeon: Ardis Hughs, MD;  Location: [facility];  Service: Urology;  Laterality: Right;  ? HYDRADENITIS EXCISION Bilateral 04/03/2017  ? Procedure: EXCISION AND DRAINAGE BILATERAL  HIDRADENITIS AXILLA;  Surgeon: Jovita Kussmaul, MD;  Location: [facility];  Service: General;  Laterality: Bilateral;  ? INCISION AND DRAINAGE ABSCESS Right 07/20/2016  ? Procedure: INCISION AND DRAINAGE ABSCESS right axilla;  Surgeon: Coralie Keens, MD;  Location: [facility];  Service: General;  Laterality: Right;  ? INTRAUTERINE DEVICE (  IUD) INSERTION  11/2016  ? "had the one in my left arm removed"  ? IRRIGATION AND DEBRIDEMENT ABSCESS Right 12/21/2016  ? Procedure: IRRIGATION AND DEBRIDEMENT RIGHT AXILLARY HIDRADENITIS;  Surgeon: Ileana Roup, MD;  Location: [facility];  Service: General;  Laterality: Right;  ? IRRIGATION AND DEBRIDEMENT ABSCESS Left 01/08/2017  ? Procedure: IRRIGATION AND DEBRIDEMENT AXILLARY  ABSCESS;  Surgeon: Jovita Kussmaul, MD;  Location: [facility];  Service: General;  Laterality: Left;  ? NASAL SEPTOPLASTY W/ TURBINOPLASTY Bilateral 02/25/2019  ? Procedure: NASAL SEPTOPLASTY WITH TURBINATE REDUCTION;  Surgeon: Izora Gala, MD;  Location: [facility];  Service: ENT;  Laterality: Bilateral;  ? TONSILLECTOMY Bilateral 09/14/2013  ? Procedure: BILATERAL TONSILLECTOMY;  Surgeon: Izora Gala, MD;  Location: [facility];  Service: ENT;  Laterality: Bilateral;  ? TYMPANOPLASTY Bilateral   ? "rebuilt eardrums"  ? WISDOM TOOTH EXTRACTION    ? ? ?Family Psychiatric History: Reviewed family psychiatric history from progress note on 08/04/2019. ? ?Family History:  ?Family History  ?Problem Relation Age of Onset  ? Hypertension Mother   ? Hyperlipidemia Mother   ? Diabetes Mother   ? Heart attack Father 44  ? Parkinson's disease Maternal Grandmother   ? Dementia Maternal Grandmother   ? Diabetes Maternal Grandmother   ? Bell's palsy Maternal Grandmother   ? Alcohol abuse Paternal Uncle   ? ? ?Social History: Reviewed social history from progress note on 08/04/2019. ?Social History  ? ?Socioeconomic History  ? Marital status: Single  ?  Spouse name: Not on file  ? Number of children: Not on file  ? Years of education: EMS/Fire training  ? Highest education level: Not on file  ?Occupational History  ? Not on file  ?Tobacco Use  ? Smoking status: Never  ? Smokeless tobacco: Never  ?Vaping Use  ? Vaping Use: Former  ?Substance and Sexual Activity  ? Alcohol use: Not Currently  ? Drug use: Yes  ?  Types: Marijuana  ?  Comment: in high school  ? Sexual activity: Yes  ?  Birth control/protection: Pill, Condom  ?Other Topics Concern  ? Not on file  ?Social History Narrative  ? 02/11/19  ? From: the area  ? Living: with Mom currently, working to find her own place  ? Work: EMS/Fire  ?   ? Family: good relationship with Mom and Grandma  ?   ? Enjoys: spend time with friends/family, hunting, bomb fire  ?    ? Exercise: every shift does training - 2-3 times a week  ? Diet: well rounded, meat heavy  ?   ? Safety  ? Seat belts: Yes   ? Guns: Yes  and secure  ? Safe in relationships: Yes   ? Helmet: no   ? ?Social Determinants of Health  ? ?Financial Resource Strain: Not on file  ?Food Insecurity: Not on file  ?Transportation Needs: Not on file  ?Physical Activity: Not on file  ?Stress: Not on file  ?Social Connections: Not on file  ? ? ?Allergies:  ?Allergies  ?Allergen Reactions  ? Other Hives  ?  ABD pad causes rash ?Pt can only tolerate cloth tape ?Pt can only tolerate cloth tape  ? Shellfish Allergy Hives  ? Tapentadol Hives and Other (See Comments)  ?  Pt can only tolerate cloth tape  ? Tape Hives and Other (See Comments)  ?  Pt can only tolerate cloth tape  ? Ginger Hives  ? Morphine And Related   ?  "  I become very angry"  ? Vancomycin Other (See Comments)  ?  Burns her vein really bad and always causes them to blow.  ? ? ?Metabolic Disorder Labs: ?Lab Results  ?Component Value Date  ? HGBA1C 5.6 05/18/2021  ? ?No results found for: PROLACTIN ?Lab Results  ?Component Value Date  ? CHOL 195 07/11/2020  ? TRIG 224.0 (H) 07/11/2020  ? HDL 41.80 07/11/2020  ? CHOLHDL 5 07/11/2020  ? VLDL 44.8 (H) 07/11/2020  ? LDLCALC 119 (H) 05/18/2019  ? LDLCALC 116 (H) 12/03/2016  ? ?Lab Results  ?Component Value Date  ? TSH 0.62 02/14/2021  ? TSH 1.32 08/05/2019  ? ? ?Therapeutic Level Labs: ?No results found for: LITHIUM ?No results found for: VALPROATE ?No components found for:  CBMZ ? ?Current Medications: ?Current Outpatient Medications  ?Medication Sig Dispense Refill  ? ARIPiprazole (ABILIFY) 2 MG tablet Take 1 tablet (2 mg total) by mouth daily. 90 tablet 1  ? busPIRone (BUSPAR) 10 MG tablet Take 1 tablet (10 mg total) by mouth 2 (two) times daily. 60 tablet 1  ? clindamycin (CLEOCIN) 300 MG capsule Take 300 mg by mouth 2 (two) times daily.    ? diazepam (VALIUM) 10 MG tablet Take 1 tablet 30 minutes prior to dermatology  procedure    ? diclofenac (VOLTAREN) 75 MG EC tablet Take by mouth.    ? drospirenone-ethinyl estradiol (YAZ) 3-0.02 MG tablet     ? EPINEPHrine (EPIPEN 2-PAK) 0.3 mg/0.3 mL IJ SOAJ injection Inject 0.3 mg in

## 2021-06-30 DIAGNOSIS — L732 Hidradenitis suppurativa: Secondary | ICD-10-CM | POA: Diagnosis not present

## 2021-07-01 ENCOUNTER — Inpatient Hospital Stay
Admission: EM | Admit: 2021-07-01 | Discharge: 2021-07-06 | DRG: 871 | Disposition: A | Discharge status: Home / Self Care | Payer: BC Managed Care – PPO | Attending: Internal Medicine | Admitting: Internal Medicine

## 2021-07-01 ENCOUNTER — Emergency Department: Payer: BC Managed Care – PPO

## 2021-07-01 DIAGNOSIS — Z820 Family history of epilepsy and other diseases of the nervous system: Secondary | ICD-10-CM | POA: Diagnosis not present

## 2021-07-01 DIAGNOSIS — F3181 Bipolar II disorder: Secondary | ICD-10-CM | POA: Diagnosis not present

## 2021-07-01 DIAGNOSIS — I2699 Other pulmonary embolism without acute cor pulmonale: Secondary | ICD-10-CM

## 2021-07-01 DIAGNOSIS — E669 Obesity, unspecified: Secondary | ICD-10-CM | POA: Diagnosis not present

## 2021-07-01 DIAGNOSIS — M7989 Other specified soft tissue disorders: Secondary | ICD-10-CM

## 2021-07-01 DIAGNOSIS — Z793 Long term (current) use of hormonal contraceptives: Secondary | ICD-10-CM | POA: Diagnosis not present

## 2021-07-01 DIAGNOSIS — R6510 Systemic inflammatory response syndrome (SIRS) of non-infectious origin without acute organ dysfunction: Secondary | ICD-10-CM | POA: Diagnosis not present

## 2021-07-01 DIAGNOSIS — F909 Attention-deficit hyperactivity disorder, unspecified type: Secondary | ICD-10-CM | POA: Diagnosis not present

## 2021-07-01 DIAGNOSIS — R000 Tachycardia, unspecified: Secondary | ICD-10-CM | POA: Diagnosis not present

## 2021-07-01 DIAGNOSIS — I517 Cardiomegaly: Secondary | ICD-10-CM | POA: Diagnosis not present

## 2021-07-01 DIAGNOSIS — Z8249 Family history of ischemic heart disease and other diseases of the circulatory system: Secondary | ICD-10-CM | POA: Diagnosis not present

## 2021-07-01 DIAGNOSIS — Z792 Long term (current) use of antibiotics: Secondary | ICD-10-CM | POA: Diagnosis not present

## 2021-07-01 DIAGNOSIS — Z8349 Family history of other endocrine, nutritional and metabolic diseases: Secondary | ICD-10-CM | POA: Diagnosis not present

## 2021-07-01 DIAGNOSIS — L732 Hidradenitis suppurativa: Secondary | ICD-10-CM | POA: Diagnosis not present

## 2021-07-01 DIAGNOSIS — Z452 Encounter for adjustment and management of vascular access device: Secondary | ICD-10-CM | POA: Diagnosis not present

## 2021-07-01 DIAGNOSIS — R0689 Other abnormalities of breathing: Secondary | ICD-10-CM | POA: Diagnosis not present

## 2021-07-01 DIAGNOSIS — R6521 Severe sepsis with septic shock: Secondary | ICD-10-CM | POA: Diagnosis not present

## 2021-07-01 DIAGNOSIS — L259 Unspecified contact dermatitis, unspecified cause: Secondary | ICD-10-CM | POA: Diagnosis not present

## 2021-07-01 DIAGNOSIS — M79621 Pain in right upper arm: Secondary | ICD-10-CM

## 2021-07-01 DIAGNOSIS — I2609 Other pulmonary embolism with acute cor pulmonale: Secondary | ICD-10-CM | POA: Diagnosis not present

## 2021-07-01 DIAGNOSIS — G9341 Metabolic encephalopathy: Secondary | ICD-10-CM

## 2021-07-01 DIAGNOSIS — Z833 Family history of diabetes mellitus: Secondary | ICD-10-CM

## 2021-07-01 DIAGNOSIS — A5901 Trichomonal vulvovaginitis: Secondary | ICD-10-CM | POA: Diagnosis not present

## 2021-07-01 DIAGNOSIS — A419 Sepsis, unspecified organism: Secondary | ICD-10-CM | POA: Diagnosis not present

## 2021-07-01 DIAGNOSIS — Z20822 Contact with and (suspected) exposure to covid-19: Secondary | ICD-10-CM | POA: Diagnosis present

## 2021-07-01 DIAGNOSIS — Z79899 Other long term (current) drug therapy: Secondary | ICD-10-CM

## 2021-07-01 DIAGNOSIS — R4182 Altered mental status, unspecified: Secondary | ICD-10-CM | POA: Diagnosis not present

## 2021-07-01 DIAGNOSIS — T782XXA Anaphylactic shock, unspecified, initial encounter: Secondary | ICD-10-CM | POA: Diagnosis not present

## 2021-07-01 DIAGNOSIS — Z6837 Body mass index (BMI) 37.0-37.9, adult: Secondary | ICD-10-CM

## 2021-07-01 DIAGNOSIS — E860 Dehydration: Secondary | ICD-10-CM | POA: Diagnosis not present

## 2021-07-01 DIAGNOSIS — R55 Syncope and collapse: Secondary | ICD-10-CM | POA: Diagnosis not present

## 2021-07-01 DIAGNOSIS — T7840XA Allergy, unspecified, initial encounter: Secondary | ICD-10-CM | POA: Diagnosis not present

## 2021-07-01 DIAGNOSIS — Z0389 Encounter for observation for other suspected diseases and conditions ruled out: Secondary | ICD-10-CM | POA: Diagnosis not present

## 2021-07-01 LAB — COMPREHENSIVE METABOLIC PANEL
ALT: 11 U/L (ref 0–44)
AST: 13 U/L — ABNORMAL LOW (ref 15–41)
Albumin: 2 g/dL — ABNORMAL LOW (ref 3.5–5.0)
Alkaline Phosphatase: 37 U/L — ABNORMAL LOW (ref 38–126)
Anion gap: 11 (ref 5–15)
BUN: 11 mg/dL (ref 6–20)
CO2: 18 mmol/L — ABNORMAL LOW (ref 22–32)
Calcium: 7.6 mg/dL — ABNORMAL LOW (ref 8.9–10.3)
Chloride: 106 mmol/L (ref 98–111)
Creatinine, Ser: 0.61 mg/dL (ref 0.44–1.00)
GFR, Estimated: >60 mL/min (ref >60)
Glucose, Bld: 76 mg/dL (ref 70–99)
Potassium: 4.2 mmol/L (ref 3.5–5.1)
Sodium: 135 mmol/L (ref 135–145)
Total Bilirubin: 0.2 mg/dL — ABNORMAL LOW (ref 0.3–1.2)
Total Protein: 4.9 g/dL — ABNORMAL LOW (ref 6.5–8.1)

## 2021-07-01 LAB — CBC WITH DIFFERENTIAL/PLATELET
Abs Immature Granulocytes: 0.25 10*3/uL — ABNORMAL HIGH (ref 0.00–0.07)
Basophils Absolute: 0.1 10*3/uL (ref 0.0–0.1)
Basophils Relative: 0 %
Eosinophils Absolute: 0.1 10*3/uL (ref 0.0–0.5)
Eosinophils Relative: 1 %
HCT: 26.9 % — ABNORMAL LOW (ref 36.0–46.0)
Hemoglobin: 8.1 g/dL — ABNORMAL LOW (ref 12.0–15.0)
Immature Granulocytes: 1 %
Lymphocytes Relative: 18 %
Lymphs Abs: 3.3 10*3/uL (ref 0.7–4.0)
MCH: 28.7 pg (ref 26.0–34.0)
MCHC: 30.1 g/dL (ref 30.0–36.0)
MCV: 95.4 fL (ref 80.0–100.0)
Monocytes Absolute: 1.3 10*3/uL — ABNORMAL HIGH (ref 0.1–1.0)
Monocytes Relative: 7 %
Neutro Abs: 13.3 10*3/uL — ABNORMAL HIGH (ref 1.7–7.7)
Neutrophils Relative %: 73 %
Platelets: 207 10*3/uL (ref 150–400)
RBC: 2.82 MIL/uL — ABNORMAL LOW (ref 3.87–5.11)
RDW: 15.4 % (ref 11.5–15.5)
WBC: 18.3 10*3/uL — ABNORMAL HIGH (ref 4.0–10.5)
nRBC: 0 % (ref 0.0–0.2)

## 2021-07-01 LAB — LACTIC ACID, PLASMA: Lactic Acid, Venous: >9 mmol/L (ref 0.5–1.9)

## 2021-07-01 MED ORDER — VANCOMYCIN HCL 2000 MG/400ML IV SOLN
2000.0000 mg | Freq: Once | INTRAVENOUS | Status: AC
  Administered 2021-07-01: 2000 mg via INTRAVENOUS
  Filled 2021-07-01: qty 400

## 2021-07-01 MED ORDER — LACTATED RINGERS IV BOLUS (SEPSIS)
1000.0000 mL | Freq: Once | INTRAVENOUS | Status: AC
Start: 2021-07-01 — End: 2021-07-02
  Administered 2021-07-01: 1000 mL via INTRAVENOUS

## 2021-07-01 MED ORDER — SODIUM CHLORIDE 0.9 % IV SOLN
1.0000 g | Freq: Once | INTRAVENOUS | Status: AC
  Administered 2021-07-02: 1 g via INTRAVENOUS
  Filled 2021-07-01 (×3): qty 20

## 2021-07-01 MED ORDER — LACTATED RINGERS IV BOLUS (SEPSIS)
1000.0000 mL | Freq: Once | INTRAVENOUS | Status: AC
  Administered 2021-07-01: 1000 mL via INTRAVENOUS

## 2021-07-01 MED ORDER — HYDROCORTISONE SOD SUC (PF) 100 MG IJ SOLR: 100.0000 mg | Freq: Once | INTRAMUSCULAR | Status: DC

## 2021-07-01 MED ORDER — ALTEPLASE 2 MG IJ SOLR
2.0000 mg | Freq: Once | INTRAMUSCULAR | Status: DC
  Filled 2021-07-01: qty 2

## 2021-07-01 NOTE — ED Notes (Signed)
Called lab to tube meropenem ? ?

## 2021-07-01 NOTE — Progress Notes (Signed)
Pt being followed by [facility] for Sepsis protocol. 

## 2021-07-01 NOTE — ED Notes (Signed)
Pt mother at bedside stating that  reddness on pt PICC site is related to patients allergy to adhesive per patients MD on Tuesday ?

## 2021-07-01 NOTE — Progress Notes (Signed)
CODE SEPSIS - PHARMACY COMMUNICATION ? ?**Broad Spectrum Antibiotics should be administered within 1 hour of Sepsis diagnosis** ? ?Time Code Sepsis Called/Page Received: 4/29 @ 2238 ? ?Antibiotics Ordered: Vancomycin, Meropenem  ? ?Time of 1st antibiotic administration: Vancomycin 2 gm IV X 1  ? ?Additional action taken by pharmacy:  ? ?If necessary, Name of Provider/Nurse Contacted:  ? ? ? ?Vaughn Frieze D ,PharmD ?Clinical Pharmacist  ?07/01/2021  11:39 PM ? ?

## 2021-07-01 NOTE — ED Triage Notes (Addendum)
Pt arrives today with [facility] lethargic from home after patients mother called EMS for AMS and drowsiness and possible syncope. Pt has been on IV abx for infection but their PICC has been slow to flush.  EMS started 20g in L hand and provided 1LNS at this time. Pt was drowsy with bp 107sys.  Cbg 120 enroute. ?Pt took 50mg  Benadryl PO and one (maybe two but definitely one) 350mg  THC gummies prior to EMS arrival. ?

## 2021-07-01 NOTE — ED Provider Notes (Signed)
? ?[facility] ?Provider Note ? ? ? None  ?  (approximate) ? ? ?History  ? ?Hypotension ? ? ?HPI ? ?Courtney [name] is a 31 y.o. female with past medical history of hidradenitis suppurativa, currently on IV ertapenem, ADHD, depression, here with altered mental status.  Patient has reportedly been slightly more weak than usual over the last week.  She has had some mild increased work of breathing but has not necessarily complained of shortness of breath.  She has been getting IV ertapenem through PICC line and is actually done with this in the next 3 days.  She has had some redness at the PICC line site but mother thought this was due to reaction to the adhesives.  Throughout the day today, has been sleeping but otherwise her self.  She reportedly went to a store and came back.  She reportedly took multiple THC edibles.  She then has confused, drowsy, and vomited.  She has taken these before, but not with the current amount.  On my assessment, patient drowsy, limiting history. ?  ? ? ?Physical Exam  ? ?Triage Vital Signs: ?ED Triage Vitals [07/01/21 2229]  ?Enc Vitals Group  ?   BP (!) 96/58  ?   Pulse Rate (!) 105  ?   Resp (!) 25  ?   Temp 97.7 ?F (36.5 ?C)  ?   Temp Source Oral  ?   SpO2 95 %  ?   Weight   ?   Height   ?   Head Circumference   ?   Peak Flow   ?   Pain Score   ?   Pain Loc   ?   Pain Edu?   ?   Excl. in GC?   ? ? ?Most recent vital signs: ?Vitals:  ? 07/02/21 0000 07/02/21 0015  ?BP: 101/69 117/83  ?Pulse: 89 84  ?Resp: (!) 22 19  ?Temp:    ?SpO2: 100% 100%  ? ? ? ?General: Drowsy but arousable. ?CV:  Good peripheral perfusion. Tachycardic. No murmurs. ?Resp:  Mild tachypnea noted, but lungs CTAB. ?Abd:  No distention. No tenderness. ?Other:  RUE PICC site with mild erythema around the line. No drainage or purulence. ? ? ?ED Results / Procedures / Treatments  ? ?Labs ?(all labs ordered are listed, but only abnormal results are displayed) ?Labs Reviewed  ?LACTIC ACID, PLASMA -  Abnormal; Notable for the following components:  ?    Result Value  ? Lactic Acid, Venous >9.0 (*)   ? All other components within normal limits  ?COMPREHENSIVE METABOLIC PANEL - Abnormal; Notable for the following components:  ? CO2 18 (*)   ? Calcium 7.6 (*)   ? Total Protein 4.9 (*)   ? Albumin 2.0 (*)   ? AST 13 (*)   ? Alkaline Phosphatase 37 (*)   ? Total Bilirubin 0.2 (*)   ? All other components within normal limits  ?CBC WITH DIFFERENTIAL/PLATELET - Abnormal; Notable for the following components:  ? WBC 18.3 (*)   ? RBC 2.82 (*)   ? Hemoglobin 8.1 (*)   ? HCT 26.9 (*)   ? Neutro Abs 13.3 (*)   ? Monocytes Absolute 1.3 (*)   ? Abs Immature Granulocytes 0.25 (*)   ? All other components within normal limits  ?APTT - Abnormal; Notable for the following components:  ? aPTT 20 (*)   ? All other components within normal limits  ?BLOOD GAS, VENOUS -  Abnormal; Notable for the following components:  ? pCO2, Ven 39 (*)   ? pO2, Ven 60 (*)   ? All other components within normal limits  ?RESP PANEL BY RT-PCR (FLU A&B, COVID) [id number]  ?CULTURE, BLOOD (ROUTINE X 2)  ?CULTURE, BLOOD (ROUTINE X 2)  ?URINE CULTURE  ?CATH TIP CULTURE  ?PROTIME-INR  ?LACTIC ACID, PLASMA  ?URINALYSIS, COMPLETE (UACMP) WITH MICROSCOPIC  ?ETHANOL  ?ACETAMINOPHEN LEVEL  ?SALICYLATE LEVEL  ?URINE DRUG SCREEN, QUALITATIVE ([facility] ONLY)  ?BRAIN NATRIURETIC PEPTIDE  ?HIV ANTIBODY (ROUTINE TESTING W REFLEX)  ?CBC  ?MAGNESIUM  ?PHOSPHORUS  ?COMPREHENSIVE METABOLIC PANEL  ?HCG, QUANTITATIVE, PREGNANCY  ?PROCALCITONIN  ?POC URINE PREG, ED  ? ? ? ?EKG ?Sinus tachycardia, VR 106. PR 136, QRS 77, QTc 444. No acute ST elevations. No ischemia or infarct. ? ? ?RADIOLOGY ?CXR: Cardiomegaly, no focal findings ? ? ?I also independently reviewed and agree with radiologist interpretations. ? ? ?PROCEDURES: ? ?Critical Care performed: Yes, see critical care procedure note(s) ? ?.Critical Care ?Performed by: Shaune Pollack, MD ?Authorized by: Shaune Pollack, MD   ? ?Critical care provider statement:  ?  Critical care time (minutes):  30 ?  Critical care time was exclusive of:  Separately billable procedures and treating other patients ?  Critical care was necessary to treat or prevent imminent or life-threatening deterioration of the following conditions:  Cardiac failure, circulatory failure, respiratory failure and sepsis ?  Critical care was time spent personally by me on the following activities:  Development of treatment plan with patient or surrogate, discussions with consultants, evaluation of patient's response to treatment, examination of patient, ordering and review of laboratory studies, ordering and review of radiographic studies, ordering and performing treatments and interventions, pulse oximetry, re-evaluation of patient's condition and review of old charts ?Ultrasound ED Peripheral IV (Provider) ? ?Date/Time: 07/02/2021 2:22 AM ?Performed by: Shaune Pollack, MD ?Authorized by: Shaune Pollack, MD  ? ?Procedure details:  ?  Indications: multiple failed IV attempts   ?  Skin Prep: chlorhexidine gluconate   ?  Location:  Left anterior forearm ?  Angiocath:  18 G ?  Bedside Ultrasound Guided: Yes   ?  Images: not archived   ?  Patient tolerated procedure without complications: Yes   ?  Dressing applied: Yes   ? ? ? ?MEDICATIONS ORDERED IN ED: ?Medications  ?vancomycin (VANCOREADY) IVPB 2000 mg/400 mL (2,000 mg Intravenous New Bag/Given 07/01/21 2319)  ?hydrocortisone sodium succinate (SOLU-CORTEF) 100 MG injection 100 mg (has no administration in time range)  ?docusate sodium (COLACE) capsule 100 mg (has no administration in time range)  ?polyethylene glycol (MIRALAX / GLYCOLAX) packet 17 g (has no administration in time range)  ?enoxaparin (LOVENOX) injection 47.5 mg (has no administration in time range)  ?famotidine (PEPCID) IVPB 20 mg premix (20 mg Intravenous New Bag/Given 07/02/21 0206)  ?lactated ringers bolus 1,000 mL (0 mLs Intravenous Stopped 07/02/21  0107)  ?  And  ?lactated ringers bolus 1,000 mL (0 mLs Intravenous Stopped 07/02/21 0040)  ?  And  ?lactated ringers bolus 1,000 mL (0 mLs Intravenous Stopped 07/02/21 0154)  ?meropenem (MERREM) 1 g in sodium chloride 0.9 % 100 mL IVPB (0 g Intravenous Stopped 07/02/21 0107)  ? ? ? ?IMPRESSION / MDM / ASSESSMENT AND PLAN / ED COURSE  ?I reviewed the triage vital signs and the nursing notes. ?             ?               ? ? ?  The patient is on the cardiac monitor to evaluate for evidence of arrhythmia and/or significant heart rate changes. ? ? ?Ddx:  ?Sepsis 2/2 PICC line infection, ongoing hydradenitis, UTI, PNA, [facility], other overdose, metabolic encephalopathy ? ? ?MDM:  ?31 yo F with h/o hydradenitis on immunologic tx and current course of ertapenem here with AMS. Pt arrives tachycardic, hypotensive, and altered. Presentation somewhat complicated by pt's use of THC prior to arrival, but clinically concern for sepsis with red PICC site. Areas of hydradenitis do not appear acutely worsened. Labs, imaging sent and broad-spectrum ABX and fluids started. IV placed by me. ? ?Labs show leukocytosis, marked lactic acidosis >9. CMP with dehydration but o/w unremarkable. CXR without focal PNA.  ? ?Given profound lactic acidosis with AMS, will admit to ICU for close monitoring and further w/u. Will obtain CT for evaluation of occult ischemia/infection in setting of her profound lactic acidosis.  ? ? ?MEDICATIONS GIVEN IN ED: ?Medications  ?vancomycin (VANCOREADY) IVPB 2000 mg/400 mL (2,000 mg Intravenous New Bag/Given 07/01/21 2319)  ?hydrocortisone sodium succinate (SOLU-CORTEF) 100 MG injection 100 mg (has no administration in time range)  ?docusate sodium (COLACE) capsule 100 mg (has no administration in time range)  ?polyethylene glycol (MIRALAX / GLYCOLAX) packet 17 g (has no administration in time range)  ?enoxaparin (LOVENOX) injection 47.5 mg (has no administration in time range)   ?famotidine (PEPCID) IVPB 20 mg premix (20 mg Intravenous New Bag/Given 07/02/21 0206)  ?lactated ringers bolus 1,000 mL (0 mLs Intravenous Stopped 07/02/21 0107)  ?  And  ?lactated ringers bolus 1,000 mL (0 mLs In

## 2021-07-01 NOTE — Progress Notes (Signed)
PHARMACY -  BRIEF ANTIBIOTIC NOTE  ? ?Pharmacy has received consult(s) for Vancomycin, Meropenem from an ED provider.  The patient's profile has been reviewed for ht/wt/allergies/indication/available labs.   ? ?One time order(s) placed for Vancomycin 2 gm IV X 1 and meropenem 1 gm IV X 1.  ? ?Further antibiotics/pharmacy consults should be ordered by admitting physician if indicated.       ?                ?Thank you, ?Lesette Frary D ?07/01/2021  10:48 PM ? ?

## 2021-07-02 ENCOUNTER — Inpatient Hospital Stay: Payer: BC Managed Care – PPO

## 2021-07-02 ENCOUNTER — Other Ambulatory Visit: Payer: Self-pay

## 2021-07-02 ENCOUNTER — Encounter: Payer: Self-pay | Admitting: Internal Medicine

## 2021-07-02 DIAGNOSIS — E860 Dehydration: Secondary | ICD-10-CM | POA: Diagnosis present

## 2021-07-02 DIAGNOSIS — L732 Hidradenitis suppurativa: Secondary | ICD-10-CM | POA: Diagnosis not present

## 2021-07-02 DIAGNOSIS — Z8349 Family history of other endocrine, nutritional and metabolic diseases: Secondary | ICD-10-CM | POA: Diagnosis not present

## 2021-07-02 DIAGNOSIS — A419 Sepsis, unspecified organism: Secondary | ICD-10-CM

## 2021-07-02 DIAGNOSIS — G9341 Metabolic encephalopathy: Secondary | ICD-10-CM | POA: Diagnosis present

## 2021-07-02 DIAGNOSIS — F3181 Bipolar II disorder: Secondary | ICD-10-CM | POA: Diagnosis present

## 2021-07-02 DIAGNOSIS — Z833 Family history of diabetes mellitus: Secondary | ICD-10-CM | POA: Diagnosis not present

## 2021-07-02 DIAGNOSIS — Z20822 Contact with and (suspected) exposure to covid-19: Secondary | ICD-10-CM | POA: Diagnosis present

## 2021-07-02 DIAGNOSIS — A5901 Trichomonal vulvovaginitis: Secondary | ICD-10-CM | POA: Diagnosis not present

## 2021-07-02 DIAGNOSIS — I2699 Other pulmonary embolism without acute cor pulmonale: Secondary | ICD-10-CM | POA: Diagnosis present

## 2021-07-02 DIAGNOSIS — R6521 Severe sepsis with septic shock: Secondary | ICD-10-CM | POA: Diagnosis present

## 2021-07-02 DIAGNOSIS — Z792 Long term (current) use of antibiotics: Secondary | ICD-10-CM | POA: Diagnosis not present

## 2021-07-02 DIAGNOSIS — F909 Attention-deficit hyperactivity disorder, unspecified type: Secondary | ICD-10-CM | POA: Diagnosis present

## 2021-07-02 DIAGNOSIS — R6510 Systemic inflammatory response syndrome (SIRS) of non-infectious origin without acute organ dysfunction: Secondary | ICD-10-CM | POA: Diagnosis not present

## 2021-07-02 DIAGNOSIS — L259 Unspecified contact dermatitis, unspecified cause: Secondary | ICD-10-CM | POA: Diagnosis present

## 2021-07-02 DIAGNOSIS — Z8249 Family history of ischemic heart disease and other diseases of the circulatory system: Secondary | ICD-10-CM | POA: Diagnosis not present

## 2021-07-02 DIAGNOSIS — Z820 Family history of epilepsy and other diseases of the nervous system: Secondary | ICD-10-CM | POA: Diagnosis not present

## 2021-07-02 DIAGNOSIS — E669 Obesity, unspecified: Secondary | ICD-10-CM | POA: Diagnosis present

## 2021-07-02 DIAGNOSIS — Z79899 Other long term (current) drug therapy: Secondary | ICD-10-CM | POA: Diagnosis not present

## 2021-07-02 DIAGNOSIS — Z6837 Body mass index (BMI) 37.0-37.9, adult: Secondary | ICD-10-CM | POA: Diagnosis not present

## 2021-07-02 DIAGNOSIS — Z793 Long term (current) use of hormonal contraceptives: Secondary | ICD-10-CM | POA: Diagnosis not present

## 2021-07-02 HISTORY — DX: Other pulmonary embolism without acute cor pulmonale: I26.99

## 2021-07-02 HISTORY — DX: Sepsis, unspecified organism: A41.9

## 2021-07-02 LAB — CBC
HCT: 34.5 % — ABNORMAL LOW (ref 36.0–46.0)
Hemoglobin: 11.3 g/dL — ABNORMAL LOW (ref 12.0–15.0)
MCH: 29.3 pg (ref 26.0–34.0)
MCHC: 32.8 g/dL (ref 30.0–36.0)
MCV: 89.4 fL (ref 80.0–100.0)
Platelets: 256 10*3/uL (ref 150–400)
RBC: 3.86 MIL/uL — ABNORMAL LOW (ref 3.87–5.11)
RDW: 15.4 % (ref 11.5–15.5)
WBC: 19.5 10*3/uL — ABNORMAL HIGH (ref 4.0–10.5)
nRBC: 0 % (ref 0.0–0.2)

## 2021-07-02 LAB — COMPREHENSIVE METABOLIC PANEL
ALT: 13 U/L (ref 0–44)
AST: 18 U/L (ref 15–41)
Albumin: 2.5 g/dL — ABNORMAL LOW (ref 3.5–5.0)
Alkaline Phosphatase: 54 U/L (ref 38–126)
Anion gap: 8 (ref 5–15)
BUN: 13 mg/dL (ref 6–20)
CO2: 24 mmol/L (ref 22–32)
Calcium: 8.5 mg/dL — ABNORMAL LOW (ref 8.9–10.3)
Chloride: 104 mmol/L (ref 98–111)
Creatinine, Ser: 0.74 mg/dL (ref 0.44–1.00)
GFR, Estimated: >60 mL/min (ref >60)
Glucose, Bld: 90 mg/dL (ref 70–99)
Potassium: 4.1 mmol/L (ref 3.5–5.1)
Sodium: 136 mmol/L (ref 135–145)
Total Bilirubin: 0.4 mg/dL (ref 0.3–1.2)
Total Protein: 6.3 g/dL — ABNORMAL LOW (ref 6.5–8.1)

## 2021-07-02 LAB — BASIC METABOLIC PANEL
Anion gap: 9 (ref 5–15)
BUN: 11 mg/dL (ref 6–20)
CO2: 24 mmol/L (ref 22–32)
Calcium: 8.8 mg/dL — ABNORMAL LOW (ref 8.9–10.3)
Chloride: 104 mmol/L (ref 98–111)
Creatinine, Ser: 0.64 mg/dL (ref 0.44–1.00)
GFR, Estimated: >60 mL/min (ref >60)
Glucose, Bld: 84 mg/dL (ref 70–99)
Potassium: 4.1 mmol/L (ref 3.5–5.1)
Sodium: 137 mmol/L (ref 135–145)

## 2021-07-02 LAB — URINE DRUG SCREEN, QUALITATIVE (ARMC ONLY)
Amphetamines, Ur Screen: NOT DETECTED
Barbiturates, Ur Screen: NOT DETECTED
Benzodiazepine, Ur Scrn: NOT DETECTED
Cannabinoid 50 Ng, Ur ~~LOC~~: POSITIVE — AB
Cocaine Metabolite,Ur ~~LOC~~: NOT DETECTED
MDMA (Ecstasy)Ur Screen: NOT DETECTED
Methadone Scn, Ur: NOT DETECTED
Opiate, Ur Screen: POSITIVE — AB
Phencyclidine (PCP) Ur S: NOT DETECTED
Tricyclic, Ur Screen: NOT DETECTED

## 2021-07-02 LAB — URINALYSIS, COMPLETE (UACMP) WITH MICROSCOPIC
Bacteria, UA: NONE SEEN
Bilirubin Urine: NEGATIVE
Glucose, UA: NEGATIVE mg/dL
Ketones, ur: NEGATIVE mg/dL
Nitrite: NEGATIVE
Protein, ur: NEGATIVE mg/dL
Specific Gravity, Urine: 1.025 (ref 1.005–1.030)
pH: 5 (ref 5.0–8.0)

## 2021-07-02 LAB — LACTIC ACID, PLASMA
Lactic Acid, Venous: 3 mmol/L (ref 0.5–1.9)
Lactic Acid, Venous: 2.1 mmol/L (ref 0.5–1.9)

## 2021-07-02 LAB — GLUCOSE, CAPILLARY
Glucose-Capillary: 79 mg/dL (ref 70–99)
Glucose-Capillary: 76 mg/dL (ref 70–99)
Glucose-Capillary: 126 mg/dL — ABNORMAL HIGH (ref 70–99)

## 2021-07-02 LAB — C DIFFICILE QUICK SCREEN W PCR REFLEX
C Diff antigen: POSITIVE — AB
C Diff toxin: NEGATIVE

## 2021-07-02 LAB — MRSA NEXT GEN BY PCR, NASAL: MRSA by PCR Next Gen: NOT DETECTED

## 2021-07-02 LAB — HCG, QUANTITATIVE, PREGNANCY: hCG, Beta Chain, Quant, S: <1 m[IU]/mL (ref <5)

## 2021-07-02 LAB — APTT: aPTT: 20 seconds — ABNORMAL LOW (ref 24–36)

## 2021-07-02 LAB — CLOSTRIDIUM DIFFICILE BY PCR, REFLEXED: Toxigenic C. Difficile by PCR: NEGATIVE

## 2021-07-02 LAB — ETHANOL: Alcohol, Ethyl (B): <10 mg/dL (ref <10)

## 2021-07-02 LAB — BRAIN NATRIURETIC PEPTIDE: B Natriuretic Peptide: 57.6 pg/mL (ref 0.0–100.0)

## 2021-07-02 LAB — SALICYLATE LEVEL: Salicylate Lvl: <7 mg/dL — ABNORMAL LOW (ref 7.0–30.0)

## 2021-07-02 LAB — RESP PANEL BY RT-PCR (FLU A&B, COVID) ARPGX2
Influenza A by PCR: NEGATIVE
Influenza B by PCR: NEGATIVE
SARS Coronavirus 2 by RT PCR: NEGATIVE

## 2021-07-02 LAB — HEPARIN LEVEL (UNFRACTIONATED)
Heparin Unfractionated: 0.27 IU/mL — ABNORMAL LOW (ref 0.30–0.70)
Heparin Unfractionated: 0.23 IU/mL — ABNORMAL LOW (ref 0.30–0.70)

## 2021-07-02 LAB — MAGNESIUM: Magnesium: 1.6 mg/dL — ABNORMAL LOW (ref 1.7–2.4)

## 2021-07-02 LAB — PROTIME-INR
INR: 1.1 (ref 0.8–1.2)
Prothrombin Time: 14.5 seconds (ref 11.4–15.2)

## 2021-07-02 LAB — ACETAMINOPHEN LEVEL: Acetaminophen (Tylenol), Serum: <10 ug/mL — ABNORMAL LOW (ref 10–30)

## 2021-07-02 LAB — PHOSPHORUS: Phosphorus: 3.5 mg/dL (ref 2.5–4.6)

## 2021-07-02 LAB — PROCALCITONIN: Procalcitonin: <0.1 ng/mL

## 2021-07-02 LAB — HIV ANTIBODY (ROUTINE TESTING W REFLEX): HIV Screen 4th Generation wRfx: NONREACTIVE

## 2021-07-02 MED ORDER — HEPARIN BOLUS VIA INFUSION
1000.0000 [IU] | Freq: Once | INTRAVENOUS | Status: AC
  Administered 2021-07-02: 1000 [IU] via INTRAVENOUS
  Filled 2021-07-02: qty 1000

## 2021-07-02 MED ORDER — CHLORHEXIDINE GLUCONATE CLOTH 2 % EX PADS
6.0000 | MEDICATED_PAD | Freq: Every day | CUTANEOUS | Status: DC
  Administered 2021-07-02 – 2021-07-05 (×4): 6 via TOPICAL

## 2021-07-02 MED ORDER — SODIUM CHLORIDE 0.9 % IV SOLN
1.0000 g | Freq: Three times a day (TID) | INTRAVENOUS | Status: DC
  Administered 2021-07-02 – 2021-07-05 (×10): 1 g via INTRAVENOUS
  Filled 2021-07-02: qty 1
  Filled 2021-07-02 (×3): qty 20
  Filled 2021-07-02 (×6): qty 1
  Filled 2021-07-02: qty 20
  Filled 2021-07-02: qty 1

## 2021-07-02 MED ORDER — VANCOMYCIN HCL 750 MG/150ML IV SOLN: 750.0000 mg | Freq: Two times a day (BID) | INTRAVENOUS | Status: DC

## 2021-07-02 MED ORDER — DOCUSATE SODIUM 100 MG PO CAPS
100.0000 mg | ORAL_CAPSULE | Freq: Two times a day (BID) | ORAL | Status: DC | PRN
Start: 2021-07-02 — End: 2021-07-06

## 2021-07-02 MED ORDER — VANCOMYCIN HCL 750 MG/150ML IV SOLN
750.0000 mg | Freq: Two times a day (BID) | INTRAVENOUS | Status: DC
  Administered 2021-07-02 – 2021-07-05 (×6): 750 mg via INTRAVENOUS
  Filled 2021-07-02 (×6): qty 150

## 2021-07-02 MED ORDER — VANCOMYCIN HCL 750 MG/150ML IV SOLN
750.0000 mg | Freq: Two times a day (BID) | INTRAVENOUS | Status: DC
  Administered 2021-07-02: 750 mg via INTRAVENOUS
  Filled 2021-07-02 (×2): qty 150

## 2021-07-02 MED ORDER — LACTATED RINGERS IV SOLN
INTRAVENOUS | Status: AC
Start: 2021-07-02 — End: 2021-07-02

## 2021-07-02 MED ORDER — HEPARIN (PORCINE) 25000 UT/250ML-% IV SOLN
1950.0000 [IU]/h | INTRAVENOUS | Status: DC
  Administered 2021-07-02 (×2): 1500 [IU]/h via INTRAVENOUS
  Administered 2021-07-03: 1650 [IU]/h via INTRAVENOUS
  Administered 2021-07-04: 1950 [IU]/h via INTRAVENOUS
  Filled 2021-07-02 (×4): qty 250

## 2021-07-02 MED ORDER — MAGNESIUM SULFATE 4 GM/100ML IV SOLN
4.0000 g | Freq: Once | INTRAVENOUS | Status: AC
  Administered 2021-07-02: 4 g via INTRAVENOUS
  Filled 2021-07-02: qty 100

## 2021-07-02 MED ORDER — FAMOTIDINE IN NACL 20-0.9 MG/50ML-% IV SOLN
20.0000 mg | Freq: Two times a day (BID) | INTRAVENOUS | Status: DC
  Administered 2021-07-02 – 2021-07-03 (×4): 20 mg via INTRAVENOUS
  Filled 2021-07-02 (×4): qty 50

## 2021-07-02 MED ORDER — HEPARIN BOLUS VIA INFUSION
5000.0000 [IU] | Freq: Once | INTRAVENOUS | Status: AC
  Administered 2021-07-02: 5000 [IU] via INTRAVENOUS
  Filled 2021-07-02: qty 5000

## 2021-07-02 MED ORDER — IOHEXOL 350 MG/ML SOLN
100.0000 mL | Freq: Once | INTRAVENOUS | Status: AC | PRN
  Administered 2021-07-02: 100 mL via INTRAVENOUS

## 2021-07-02 MED ORDER — POLYETHYLENE GLYCOL 3350 17 G PO PACK: 17.0000 g | PACK | Freq: Every day | ORAL | Status: DC | PRN

## 2021-07-02 MED ORDER — ENOXAPARIN SODIUM 60 MG/0.6ML IJ SOSY: 0.5000 mg/kg | PREFILLED_SYRINGE | INTRAMUSCULAR | Status: DC

## 2021-07-02 NOTE — Progress Notes (Signed)
Multiple secure chats sent to Dorian RN related to 2nd lactic acid draw, no response. 1st lactic > 9.0. ?

## 2021-07-02 NOTE — Progress Notes (Signed)
ANTICOAGULATION CONSULT NOTE  ? ?Pharmacy Consult for Heparin  ?Indication: pulmonary embolus ? ?Allergies  ?Allergen Reactions  ? Other Hives  ?  ABD pad causes rash ?Pt can only tolerate cloth tape ?Pt can only tolerate cloth tape  ? Shellfish Allergy Hives  ? Tapentadol Hives and Other (See Comments)  ?  Pt can only tolerate cloth tape  ? Tape Hives and Other (See Comments)  ?  Pt can only tolerate cloth tape  ? Ginger Hives  ? Morphine And Related   ?  " I become very angry"  ? Vancomycin Other (See Comments)  ?  Burns her vein really bad and always causes them to blow.  ? ? ?Patient Measurements: ?Height: 5\' 1"  (154.9 cm) ?Weight: 94.7 kg (208 lb 12.4 oz) ?IBW/kg (Calculated) : 47.8 ?Heparin Dosing Weight: 70 kg  ? ?Vital Signs: ?Temp: 98.4 ?F (36.9 ?C) (04/30 1415) ?Temp Source: Oral (04/30 1415) ?BP: 110/61 (04/30 1500) ?Pulse Rate: 88 (04/30 1500) ? ?Labs: ?Recent Labs  ?  07/01/21 ?2247 07/02/21 ?0157 07/02/21 [id number] 07/02/21 ?1624  ?HGB 8.1*  --  11.3*  --   ?HCT 26.9*  --  34.5*  --   ?PLT 207  --  256  --   ?APTT 20*  --   --   --   ?LABPROT 14.5  --   --   --   ?INR 1.1  --   --   --   ?HEPARINUNFRC  --   --  0.27* 0.23*  ?CREATININE 0.61 0.74 0.64  --   ? ? ? ?Estimated Creatinine Clearance: 110.1 mL/min (by C-G formula based on SCr of 0.64 mg/dL). ? ? ?Medical History: ?Past Medical History:  ?Diagnosis Date  ? ADHD (attention deficit hyperactivity disorder)   ? Bipolar disorder ([place])   ? Chronic tonsillitis 08/2013  ? current strep, will finish antibiotic 09/04/2013; snores during sleep, mother denies apnea  ? Family history of adverse reaction to anesthesia   ? " My cousin had problems waking up. "  ? Heart murmur   ? "when I was born; outgrew it; I was a preemie"  ? Hidradenitis   ? Hidradenitis 2017  ? Migraine 12/19/2016  ? "I've just had this one for 3 days" (12/20/2016)  ? ? ?Medications:  ?Medications Prior to Admission  ?Medication Sig Dispense Refill Last Dose  ? ARIPiprazole (ABILIFY) 2 MG  tablet Take 1 tablet (2 mg total) by mouth daily. 90 tablet 1 07/01/2021 at am  ? HYDROcodone-acetaminophen (NORCO/VICODIN) 5-325 MG tablet Take 1 to 2 tablets by mouth every 6 hours as needed for pain 15 tablet 0 07/01/2021 at unknown  ? hydrOXYzine (ATARAX) 25 MG tablet Take 1 tablet (25 mg total) by mouth every 8 (eight) hours as needed for itching. 90 tablet 1 07/01/2021 at unknown  ? lamoTRIgine (LAMICTAL) 100 MG tablet Take 0.5 tablets (50 mg total) by mouth 2 (two) times daily. 90 tablet 1 07/01/2021 at am  ? methylphenidate (RITALIN) 20 MG tablet Take 1 tablet (20 mg total) by mouth daily. 30 tablet 0 07/01/2021 at am  ? montelukast (SINGULAIR) 10 MG tablet TAKE 1 TABLET BY MOUTH EVERYDAY AT BEDTIME 90 tablet 0 06/29/2021 at pm  ? predniSONE (DELTASONE) 20 MG tablet Take 20 mg by mouth daily.   07/01/2021 at am  ? busPIRone (BUSPAR) 10 MG tablet Take 1 tablet (10 mg total) by mouth 2 (two) times daily. 60 tablet 1   ? clindamycin (CLEOCIN) 300 MG capsule  Take 300 mg by mouth 2 (two) times daily.     ? diazepam (VALIUM) 10 MG tablet Take 1 tablet 30 minutes prior to dermatology procedure   06/26/2021 at for procedure  ? diclofenac (VOLTAREN) 75 MG EC tablet Take by mouth.     ? diphenhydrAMINE (BENADRYL) 25 mg capsule Take by mouth.     ? drospirenone-ethinyl estradiol (YAZ) 3-0.02 MG tablet    06/24/2021 at am  ? EPINEPHrine (EPIPEN 2-PAK) 0.3 mg/0.3 mL IJ SOAJ injection Inject 0.3 mg into the muscle as needed for anaphylaxis. 1 each 0 prn at prn  ? ERTAPENEM SODIUM IV Inject into the vein.     ? fluconazole (DIFLUCAN) 150 MG tablet Take 150 mg by mouth once.     ? glucose blood (CONTOUR TEST) test strip And lancets #100 100 each 12   ? heparin lock flush 100 UNIT/ML SOLN injection Inject 2 mL (200 Units total) as directed as needed for line care. Flush using SASH (saline, administer  IV, saline, heparin) method as directed. Flush IV catheter with heparin after the last saline flush. ** Discard any unused excess in  syringe as directed**.  Use syringe ONE TIME only then discard. ?Storage: Room Temperature.     ? ibuprofen (ADVIL) 800 MG tablet Take by mouth.     ? Iron-Vitamin C 100-250 MG TABS iron     ? methylphenidate (RITALIN) 20 MG tablet Take 10 mg by mouth daily as needed.     ? [START ON 07/24/2021] methylphenidate (RITALIN) 20 MG tablet Take 1 tablet (20 mg total) by mouth daily. 30 tablet 0   ? methylphenidate (RITALIN) 20 MG tablet Take 1 tablet (20 mg total) by mouth daily. 30 tablet 0   ? Microlet Lancets MISC See admin instructions.     ? ondansetron (ZOFRAN) 4 MG tablet Take 1 tablet (4 mg total) by mouth every 6 (six) hours. 12 tablet 0 prn at prn  ? sodium chloride flush 0.9 % SOLN injection Inject 5 mL as directed as needed for line care. Flush using SASH (saline, administer  IV, saline, heparin) method as directed. Use syringe one time only then discard syringe. *Saline is another name for Sodium Chloride 0.9%* Storage: Room Temperature.     ? sulfamethoxazole-trimethoprim (BACTRIM DS) 800-160 MG tablet Take 1 tablet by mouth 2 (two) times daily.     ? traZODone (DESYREL) 50 MG tablet TAKE 1 TABLET BY MOUTH EVERYDAY AT BEDTIME 90 tablet 0 06/29/2021 at hs  ? triamcinolone ointment (KENALOG) 0.1 % APPLY TO AFFECTED AREA TWICE A DAY     ? ? ?Assessment: ?Pharmacy consulted to dose heparin in this 31 year old female with PE.  No prior anticoag noted.   ?CrCl = 109.4 ml/min ? ?Date Time HL Rate/comment ?4/30  0829 0.27  Subtherapeutic, 1200 u/hr  ?4/30 1624 0.23 Subtherapeutic, 1350 un/hr ? ?Goal of Therapy:  ?Heparin level 0.3-0.7 units/ml ?Monitor platelets by anticoagulation protocol: Yes ?  ?Plan:  ?Heparin level 0.23, subtherapeutic  ?Give 1000 unit bolus and increase drip to 1500 units/hr ?Check HL in 6 hours ?Monitor daily CBC, s/s of bleed ? ? ?Darnelle Bos, PharmD ?07/02/2021,4:56 PM ? ? ?

## 2021-07-02 NOTE — ED Notes (Signed)
Pt ambulated to toilet and back to bed. 

## 2021-07-02 NOTE — ED Provider Notes (Signed)
Just received a phone call from the radiologist that patient has bilateral subsegmental PEs.  This finding was communicated with ICU nurse practitioner Rust[place]Chester in person while she was in the ED seeing the patient.  She asked me to order heparin which I have done.  Patient and mother have been updated. ?  ?Nita Sickle, MD ?07/02/21 [phone] ? ?

## 2021-07-02 NOTE — ED Notes (Addendum)
RN to bedside. Mother now at bedside. Pt wet. Pt ambulated to toilet. Clean sheets and new brief placed,  ?

## 2021-07-02 NOTE — Progress Notes (Addendum)
ANTICOAGULATION CONSULT NOTE - Initial Consult ? ?Pharmacy Consult for Heparin  ?Indication: pulmonary embolus ? ?Allergies  ?Allergen Reactions  ? Other Hives  ?  ABD pad causes rash ?Pt can only tolerate cloth tape ?Pt can only tolerate cloth tape  ? Shellfish Allergy Hives  ? Tapentadol Hives and Other (See Comments)  ?  Pt can only tolerate cloth tape  ? Tape Hives and Other (See Comments)  ?  Pt can only tolerate cloth tape  ? Ginger Hives  ? Morphine And Related   ?  " I become very angry"  ? Vancomycin Other (See Comments)  ?  Burns her vein really bad and always causes them to blow.  ? ? ?Patient Measurements: ?  ?Heparin Dosing Weight: 70 kg  ? ?Vital Signs: ?Temp: 97.7 ?F (36.5 ?C) (04/29 2229) ?Temp Source: Oral (04/29 2229) ?BP: 114/72 (04/30 0215) ?Pulse Rate: 91 (04/30 0215) ? ?Labs: ?Recent Labs  ?  07/01/21 ?2247 07/02/21 ?0157  ?HGB 8.1*  --   ?HCT 26.9*  --   ?PLT 207  --   ?APTT 20*  --   ?LABPROT 14.5  --   ?INR 1.1  --   ?CREATININE 0.61 0.74  ? ? ?Estimated Creatinine Clearance: 109.4 mL/min (by C-G formula based on SCr of 0.74 mg/dL). ? ? ?Medical History: ?Past Medical History:  ?Diagnosis Date  ? ADHD (attention deficit hyperactivity disorder)   ? Bipolar disorder ([facility])   ? Chronic tonsillitis 08/2013  ? current strep, will finish antibiotic 09/04/2013; snores during sleep, mother denies apnea  ? Family history of adverse reaction to anesthesia   ? " My cousin had problems waking up. "  ? Heart murmur   ? "when I was born; outgrew it; I was a preemie"  ? Hidradenitis   ? Hidradenitis 2017  ? Migraine 12/19/2016  ? "I've just had this one for 3 days" (12/20/2016)  ? ? ?Medications:  ?(Not in a hospital admission)  ? ?Assessment: ?Pharmacy consulted to dose heparin in this 31 year old female with PE.  No prior anticoag noted.   ?CrCl = 109.4 ml/min ? ?Goal of Therapy:  ?Heparin level 0.3-0.7 units/ml ?Monitor platelets by anticoagulation protocol: Yes ?  ?Plan:  ?Give 5000 units bolus x 1 ?Start  heparin infusion at 1200 units/hr ?Check anti-Xa level in 6 hours and daily while on heparin ?Continue to monitor H&H and platelets ? ? ?Krisandra Bueno D ?07/02/2021,3:34 AM ? ? ?

## 2021-07-02 NOTE — Plan of Care (Signed)

## 2021-07-02 NOTE — Progress Notes (Signed)
Pharmacy Antibiotic Note ? [name] is a 31 y.o. female admitted on 07/01/2021 with sepsis.  Pharmacy has been consulted for Vancomycin, meropenem dosing.  Pt says she has intolerance to Vancomycin (burns veins).  Will double infusion time to avoid this reaction.  ? ?Plan: ?Meropenem 1 gm IV X 1 given in ED on 4/30 @ 0027. ?Meropenem 1 gm IV Q8H ordered to continue on 4/30 @ 0800. ? ?Vancomycin 2 gm IV X 1 given in ED on 4/29 @ 2319. ?Vancomycin 750 mg IV Q12H ordered to start on 4/30 @ 1100. ? ?AUC = 457.1  ?Vanc trough = 13.0  ? ?  ? ?Temp (24hrs), Avg:97.7 ?F (36.5 ?C), Min:97.7 ?F (36.5 ?C), Max:97.7 ?F (36.5 ?C) ? ?Recent Labs  ?Lab 07/01/21 ?2247 07/02/21 ?0157  ?WBC 18.3*  --   ?CREATININE 0.61 0.74  ?LATICACIDVEN >9.0* 3.0*  ?  ?Estimated Creatinine Clearance: 109.4 mL/min (by C-G formula based on SCr of 0.74 mg/dL).   ? ?Allergies  ?Allergen Reactions  ? Other Hives  ?  ABD pad causes rash ?Pt can only tolerate cloth tape ?Pt can only tolerate cloth tape  ? Shellfish Allergy Hives  ? Tapentadol Hives and Other (See Comments)  ?  Pt can only tolerate cloth tape  ? Tape Hives and Other (See Comments)  ?  Pt can only tolerate cloth tape  ? Ginger Hives  ? Morphine And Related   ?  " I become very angry"  ? Vancomycin Other (See Comments)  ?  Burns her vein really bad and always causes them to blow.  ? ? ?Antimicrobials this admission: ?  >>  ?  >>  ? ?Dose adjustments this admission: ? ? ?Microbiology results: ? BCx:  ? UCx:   ? Sputum:   ? MRSA PCR:  ? ?Thank you for allowing pharmacy to be a part of this patient?s care. ? ?[name] ?07/02/2021 4:25 AM ? ?

## 2021-07-02 NOTE — Progress Notes (Signed)
Anticoagulation monitoring(Lovenox): ? ?31 yo female ordered Lovenox 40 mg Q24h ?   ? ?BMI 39  ? ?Lab Results  ?Component Value Date  ? CREATININE 0.61 07/01/2021  ? CREATININE 0.77 11/17/2020  ? CREATININE 0.80 08/28/2020  ? ?Estimated Creatinine Clearance: 109.4 mL/min (by C-G formula based on SCr of 0.61 mg/dL). ?Hemoglobin & Hematocrit  ?   ?Component Value Date/Time  ? HGB 8.1 (L) 07/01/2021 2247  ? HCT 26.9 (L) 07/01/2021 2247  ? ? ? ?Per Protocol for Patient with estCrcl > 30 ml/min and BMI > 30, will transition to Lovenox 47.5 mg Q24h.  ?  ?\ ?

## 2021-07-02 NOTE — Progress Notes (Signed)
Pharmacy Electrolyte Monitoring Consult: ? ?Pharmacy consulted to assist in monitoring and replacing electrolytes in this 31 y.o. female admitted on 07/01/2021 with Hypotension ? history of hidradenitis suppurativa, currently on IV ertapenem, ADHD, depression, here with altered mental status ?PE+ ? ?Labs: ? ?Sodium (mmol/L)  ?Date Value  ?07/02/2021 136  ? ?Potassium (mmol/L)  ?Date Value  ?07/02/2021 4.1  ? ?Magnesium (mg/dL)  ?Date Value  ?07/02/2021 1.6 (L)  ? ?Phosphorus (mg/dL)  ?Date Value  ?07/02/2021 3.5  ? ?Calcium (mg/dL)  ?Date Value  ?07/02/2021 8.5 (L)  ? ?Albumin (g/dL)  ?Date Value  ?07/02/2021 2.5 (L)  ? ? ?Assessment/Plan: ?4/30 @0157   Mag 1.6-  Magnesium 4 gm IV x 1 ordered by NP ?-f/u electrolytes in am ? ?Courtney [name] A ?07/02/2021 ?8:18 AM ?                     ? ? ? ? ?  ?

## 2021-07-02 NOTE — H&P (Signed)
? ?NAME:  Courtney [name], MRN:  [id number], DOB:  1990-04-03, LOS: 0 ?ADMISSION DATE:  07/01/2021, CONSULTATION DATE:  07/02/21 ?REFERRING MD:  Dr. Erma HeritageIsaacs, CHIEF COMPLAINT:  lethargy  ? ?History of Present Illness:  ?31 yo F presenting to [facility] ED from home via EMS on 07/01/21 with complaints for AMS, drowsiness and possible syncope. Per mom, Courtney PihJackie, who is bedside the patient has been self-administering Ertapenem IV through a PICC line due to infected ulcers on her Left thigh and Right breast from her Hidradenitis. She was 3 days shy of completing a 12 week course. Mom also reported that because the patient is allergic to adhesive she has been scratching through her PICC dressing requiring 4 dressing changes in the last 12 weeks. However, this last week the patient has simply been reinforcing the torn dressing with tegaderms.  Mom reported patient has been experiencing times where she " felt hot" that was relieved with Tylenol over the last few weeks, otherwise no complaints.  She reported that her daughter administered her antibiotic treatment which usually makes her tired.  However Saturday evening she appeared more lethargic than usual and told her mom that she, " did not feel right".  The patient requested to go to the hospital and had difficulty standing up.  Her mom helped her to the door where she sat down and began vomiting.  At that time EMS was called.  Afterwards mom found high-dose THC Gummies with packaging showing that each gummy was 350 mg.  Mom asked the patient who reported she took 1 Saturday evening after purchasing 4, however mom has only been able to find 2 Gummies.  ?When asked mom is unsure of any other recreational drug use habits, and has been suspicious that patient might smoke cigarettes but is unsure.  Mom does admit patient had 1 margarita earlier in the evening. ?ED course: ?Upon arrival patient lethargic bordering on somnolent but protecting her airway.  Concern for sepsis protocol  initiated.  Labs revealing leukocytosis and severe lactic acidosis > 9.  CT angio and CT abdomen pelvis ordered. medications given: 3 L LR bolus, meropenem/vancomycin IV ?Initial Vitals: 97.7, 21, 104, 99/59, 100% on RA ?Significant labs: (Labs/ Imaging personally reviewed) ?I, Courtney [name], AGACNP-BC, personally viewed and interpreted this ECG. ?EKG Interpretation: Date: 07/01/2021, EKG Time: 22: 41, Rate: 106, Rhythm: Sinus tachycardia, QRS Axis: Normal, Intervals: Normal, ST/T Wave abnormalities: Nonspecific T wave inversion in lead III, Narrative Interpretation: Sinus tachycardia ?Chemistry: Na+:135, K+: 4.2, BUN/Cr.: 11/0.61, Serum CO2/ AG: 18/11 ?Hematology: WBC: 18.3, Hgb: 8.1,  ?BNP: pending, Lactic/ PCT: >9- 3/ <10 ?tylenol & salicylate: <10/ <7, ethanol: <10 ?Hcg pregnancy test: <1 ?UDS pending ?COVID-19 & Influenza A/B: negative ?VBG: 7.39/ 39/ 60/ 23.6 ? ?CXR 07/01/21: low lung volumes with possible mild interstitial edema. No definite pleural effusions ?CT angio chest 07/02/21: Lobar/segmental bilateral lower lobe pulmonary emboli. Overall clot burden is small to moderate. No evidence of right heart strain. ?CT abdomen/pelvis w contrast 07/02/21: Negative for acute abnormality ? ?PCCM consulted for admission due to severe sepsis and lethargy with risk for intubation. ? ?Pertinent  Medical History  ?Hidradenitis ?Bipolar disorder ?ADHD ?Depression ?Significant Hospital Events: ?Including procedures, antibiotic start and stop dates in addition to other pertinent events   ?07/02/2021: Admit to ICU due to severe sepsis and altered mental status at high risk for intubation ? ?Interim History / Subjective:  ?Patient rouses to name with very brief eye-opening, able to follow some intermittent commands but falls  right back to sleep.  Currently protecting airway, vital signs stable not requiring vasopressor support. ?Mom bedside updated on plan of care, all questions and concerns answered at this  time. ? ?Objective   ?Blood pressure 117/83, pulse 84, temperature 97.7 ?F (36.5 ?C), temperature source Oral, resp. rate 19, SpO2 100 %. ?   ?   ? ?Intake/Output Summary (Last 24 hours) at 07/02/2021 0143 ?Last data filed at 07/01/2021 2240 ?Gross per 24 hour  ?Intake 1000 ml  ?Output --  ?Net 1000 ml  ? ?There were no vitals filed for this visit. ? ?Examination: ?General: Adult female, acutely ill, lying in bed, NAD ?HEENT: MM pink/moist, anicteric, atraumatic, neck supple, sclera red ?Neuro: RASS -2, able to follow simple intermittent commands, PERRL +3 , MAE ?CV: s1s2 RRR, ST evidence on monitor, no r/m/g ?Pulm: Regular, non labored on RA , breath sounds clear-BUL & clear/diminished-BLL ?GI: soft, rounded, non tender, bs x 4 ?Skin: Current ulcerations left upper thigh, right breast, left axilla.  Right upper arm PICC site red. (Pics below PICC site & L axilla) ? ? ? ? ?Extremities: warm/dry, pulses + 2 R/P, no edema noted ? ?Resolved Hospital Problem list   ? ? ?Assessment & Plan:  ?Suspected sepsis without septic shock due to suspected CLABSI vs skin infection in the setting of chronic outpatient PICC placement and Hidradenitis ulcerations ?- Supplemental oxygen as needed, to maintain SpO2 > 90% ?- f/u cultures, trend lactic/ PCT ?- Daily CBC, monitor WBC/ fever curve ?- IV antibiotics: meropenem & vancomycin  ?- IVF hydration as needed: LR @ 100 mL/h post IV contrast ?- Consider vasopressors to maintain MAP< 65, norepinephrine PRN ?- Strict I/O's: alert provider if UOP < 0.5 mL/kg/hr ? ?Lobar segmental bilateral lower lobe Pulmonary embolism in the setting of OTC birth control with Yasmin  ?- Systemic Heparin drip per pharmacy protocol ?- Supplemental O2 to maintain SpO2 > 90% ?- Follow up BLE dopplers to assess for DVT ?- Consider hypercoagulable panel (Factor V leiden, lupus anticoagulant, homocysteine, antithrombin III, protein C, protein S) ?- Echocardiogram ordered  ?- consider Vascular consult if IVC  filter/ thrombectomy is warranted ? ?Acute Encephalopathy in the setting of Suspected ingestion of high concentration THC gummies (350-600 mg) ?- supportive care ?- frequent neuro checks ?- monitor for deterioration, risk for intubation ?- avoid sedating medications ? ?Best Practice (right click and "Reselect all SmartList Selections" daily)  ?Diet/type: NPO ?DVT prophylaxis: systemic heparin ?GI prophylaxis: H2B ?Lines: N/A ?Foley:  N/A ?Code Status:  full code ?Last date of multidisciplinary goals of care discussion [07/02/21] ? ?Labs   ?CBC: ?Recent Labs  ?Lab 07/01/21 ?2247  ?WBC 18.3*  ?NEUTROABS 13.3*  ?HGB 8.1*  ?HCT 26.9*  ?MCV 95.4  ?PLT 207  ? ? ?Basic Metabolic Panel: ?Recent Labs  ?Lab 07/01/21 ?2247  ?NA 135  ?K 4.2  ?CL 106  ?CO2 18*  ?GLUCOSE 76  ?BUN 11  ?CREATININE 0.61  ?CALCIUM 7.6*  ? ?GFR: ?Estimated Creatinine Clearance: 109.4 mL/min (by C-G formula based on SCr of 0.61 mg/dL). ?Recent Labs  ?Lab 07/01/21 ?2247  ?WBC 18.3*  ?LATICACIDVEN >9.0*  ? ? ?Liver Function Tests: ?Recent Labs  ?Lab 07/01/21 ?2247  ?AST 13*  ?ALT 11  ?ALKPHOS 37*  ?BILITOT 0.2*  ?PROT 4.9*  ?ALBUMIN 2.0*  ? ?No results for input(s): LIPASE, AMYLASE in the last 168 hours. ?No results for input(s): AMMONIA in the last 168 hours. ? ?ABG ?   ?Component Value Date/Time  ?  HCO3 23.6 07/01/2021 2250  ? ACIDBASEDEF 1.2 07/01/2021 2250  ? O2SAT 89.8 07/01/2021 2250  ?  ? ?Coagulation Profile: ?Recent Labs  ?Lab 07/01/21 ?2247  ?INR 1.1  ? ? ?Cardiac Enzymes: ?No results for input(s): CKTOTAL, CKMB, CKMBINDEX, TROPONINI in the last 168 hours. ? ?HbA1C: ?Hemoglobin A1C  ?Date/Time Value Ref Range Status  ?05/18/2021 02:34 PM 5.6 4.0 - 5.6 % Final  ?02/03/2021 09:02 AM 5.8 (A) 4.0 - 5.6 % Final  ? ?Hgb A1c MFr Bld  ?Date/Time Value Ref Range Status  ?07/11/2020 11:40 AM 5.6 4.6 - 6.5 % Final  ?  Comment:  ?  Glycemic Control Guidelines for People with Diabetes:Non Diabetic:  <6%Goal of Therapy: <7%Additional Action Suggested:  >8%    ?05/18/2019 10:18 AM 5.1 4.6 - 6.5 % Final  ?  Comment:  ?  Glycemic Control Guidelines for People with Diabetes:Non Diabetic:  <6%Goal of Therapy: <7%Additional Action Suggested:  >8%   ? ? ?CBG: ?No results for input(

## 2021-07-02 NOTE — ED Notes (Signed)
RN to bedside to introduce self to pt. Pt sleeping. IV heparin running.  ?

## 2021-07-02 NOTE — Progress Notes (Signed)
ANTICOAGULATION CONSULT NOTE  ? ?Pharmacy Consult for Heparin  ?Indication: pulmonary embolus ? ?Allergies  ?Allergen Reactions  ? Other Hives  ?  ABD pad causes rash ?Pt can only tolerate cloth tape ?Pt can only tolerate cloth tape  ? Shellfish Allergy Hives  ? Tapentadol Hives and Other (See Comments)  ?  Pt can only tolerate cloth tape  ? Tape Hives and Other (See Comments)  ?  Pt can only tolerate cloth tape  ? Ginger Hives  ? Morphine And Related   ?  " I become very angry"  ? Vancomycin Other (See Comments)  ?  Burns her vein really bad and always causes them to blow.  ? ? ?Patient Measurements: ?  ?Heparin Dosing Weight: 70 kg  ? ?Vital Signs: ?Temp: 97.7 ?F (36.5 ?C) (04/29 2229) ?Temp Source: Oral (04/29 2229) ?BP: 119/97 (04/30 0945) ?Pulse Rate: 89 (04/30 0945) ? ?Labs: ?Recent Labs  ?  07/01/21 ?2247 07/02/21 ?0157 07/02/21 ?9024  ?HGB 8.1*  --  11.3*  ?HCT 26.9*  --  34.5*  ?PLT 207  --  256  ?APTT 20*  --   --   ?LABPROT 14.5  --   --   ?INR 1.1  --   --   ?HEPARINUNFRC  --   --  0.27*  ?CREATININE 0.61 0.74 0.64  ? ? ? ?Estimated Creatinine Clearance: 109.4 mL/min (by C-G formula based on SCr of 0.64 mg/dL). ? ? ?Medical History: ?Past Medical History:  ?Diagnosis Date  ? ADHD (attention deficit hyperactivity disorder)   ? Bipolar disorder (HCC)   ? Chronic tonsillitis 08/2013  ? current strep, will finish antibiotic 09/04/2013; snores during sleep, mother denies apnea  ? Family history of adverse reaction to anesthesia   ? " My cousin had problems waking up. "  ? Heart murmur   ? "when I was born; outgrew it; I was a preemie"  ? Hidradenitis   ? Hidradenitis 2017  ? Migraine 12/19/2016  ? "I've just had this one for 3 days" (12/20/2016)  ? ? ?Medications:  ?(Not in a hospital admission) ? ?Assessment: ?Pharmacy consulted to dose heparin in this 31 year old female with PE.  No prior anticoag noted.   ?CrCl = 109.4 ml/min ? ?4/30 0829 HL=0.27  Subtherapeutic, inc rate from 1200 u/hr to 1350 u/hr ? ?Goal  of Therapy:  ?Heparin level 0.3-0.7 units/ml ?Monitor platelets by anticoagulation protocol: Yes ?  ?Plan:  ?4/30 0829 HL=0.27   subtherapeutic  ?Give 1000 unit bolus and increase drip to 1350 units/hr ?Check HL in 6 hours ?CBC daily ? ? ?Keylah Darwish A ?07/02/2021,10:07 AM ? ? ?

## 2021-07-03 ENCOUNTER — Inpatient Hospital Stay
Admit: 2021-07-03 | Discharge: 2021-07-03 | Disposition: A | Discharge status: Home / Self Care | Payer: BC Managed Care – PPO | Attending: Pulmonary Disease | Admitting: Pulmonary Disease

## 2021-07-03 DIAGNOSIS — L732 Hidradenitis suppurativa: Secondary | ICD-10-CM | POA: Diagnosis not present

## 2021-07-03 DIAGNOSIS — A419 Sepsis, unspecified organism: Secondary | ICD-10-CM | POA: Diagnosis not present

## 2021-07-03 LAB — GLUCOSE, CAPILLARY
Glucose-Capillary: 100 mg/dL — ABNORMAL HIGH (ref 70–99)
Glucose-Capillary: 100 mg/dL — ABNORMAL HIGH (ref 70–99)
Glucose-Capillary: 111 mg/dL — ABNORMAL HIGH (ref 70–99)
Glucose-Capillary: 119 mg/dL — ABNORMAL HIGH (ref 70–99)
Glucose-Capillary: 72 mg/dL (ref 70–99)
Glucose-Capillary: 79 mg/dL (ref 70–99)
Glucose-Capillary: 80 mg/dL (ref 70–99)
Glucose-Capillary: 84 mg/dL (ref 70–99)

## 2021-07-03 LAB — HEPARIN LEVEL (UNFRACTIONATED)
Heparin Unfractionated: 0.26 IU/mL — ABNORMAL LOW (ref 0.30–0.70)
Heparin Unfractionated: 0.28 IU/mL — ABNORMAL LOW (ref 0.30–0.70)
Heparin Unfractionated: 0.29 IU/mL — ABNORMAL LOW (ref 0.30–0.70)
Heparin Unfractionated: 0.38 IU/mL (ref 0.30–0.70)

## 2021-07-03 LAB — URINE CULTURE: Culture: NO GROWTH

## 2021-07-03 LAB — BASIC METABOLIC PANEL
Anion gap: 9 (ref 5–15)
BUN: 10 mg/dL (ref 6–20)
CO2: 26 mmol/L (ref 22–32)
Calcium: 8.8 mg/dL — ABNORMAL LOW (ref 8.9–10.3)
Chloride: 101 mmol/L (ref 98–111)
Creatinine, Ser: 0.66 mg/dL (ref 0.44–1.00)
GFR, Estimated: >60 mL/min (ref >60)
Glucose, Bld: 82 mg/dL (ref 70–99)
Potassium: 3.8 mmol/L (ref 3.5–5.1)
Sodium: 136 mmol/L (ref 135–145)

## 2021-07-03 LAB — BLOOD GAS, VENOUS
Acid-base deficit: 1.2 mmol/L (ref 0.0–2.0)
Bicarbonate: 23.6 mmol/L (ref 20.0–28.0)
O2 Saturation: 89.8 %
Patient temperature: 37
pCO2, Ven: 39 mmHg — ABNORMAL LOW (ref 44–60)
pH, Ven: 7.39 (ref 7.25–7.43)
pO2, Ven: 60 mmHg — ABNORMAL HIGH (ref 32–45)

## 2021-07-03 LAB — ECHOCARDIOGRAM COMPLETE
AR max vel: 3.28 cm2
AV Area VTI: 3.39 cm2
AV Area mean vel: 2.8 cm2
AV Mean grad: 3 mmHg
AV Peak grad: 5.3 mmHg
Ao pk vel: 1.15 m/s
Area-P 1/2: 4.19 cm2
Height: 61 in
MV VTI: 3.79 cm2
S' Lateral: 2.5 cm
Weight: 3287.5 oz

## 2021-07-03 LAB — CBC
HCT: 35.3 % — ABNORMAL LOW (ref 36.0–46.0)
Hemoglobin: 11.5 g/dL — ABNORMAL LOW (ref 12.0–15.0)
MCH: 29.1 pg (ref 26.0–34.0)
MCHC: 32.6 g/dL (ref 30.0–36.0)
MCV: 89.4 fL (ref 80.0–100.0)
Platelets: 257 10*3/uL (ref 150–400)
RBC: 3.95 MIL/uL (ref 3.87–5.11)
RDW: 15.6 % — ABNORMAL HIGH (ref 11.5–15.5)
WBC: 14.2 10*3/uL — ABNORMAL HIGH (ref 4.0–10.5)
nRBC: 0 % (ref 0.0–0.2)

## 2021-07-03 LAB — PROCALCITONIN: Procalcitonin: <0.1 ng/mL

## 2021-07-03 LAB — CREATININE, SERUM
Creatinine, Ser: 0.72 mg/dL (ref 0.44–1.00)
GFR, Estimated: >60 mL/min (ref >60)

## 2021-07-03 LAB — MAGNESIUM: Magnesium: 2.1 mg/dL (ref 1.7–2.4)

## 2021-07-03 MED ORDER — POTASSIUM CHLORIDE 20 MEQ PO PACK
20.0000 meq | PACK | Freq: Once | ORAL | Status: DC
  Filled 2021-07-03 (×2): qty 1

## 2021-07-03 MED ORDER — HEPARIN BOLUS VIA INFUSION
1050.0000 [IU] | Freq: Once | INTRAVENOUS | Status: AC
  Administered 2021-07-03: 1050 [IU] via INTRAVENOUS
  Filled 2021-07-03: qty 1050

## 2021-07-03 MED ORDER — ARIPIPRAZOLE 2 MG PO TABS
2.0000 mg | ORAL_TABLET | Freq: Every day | ORAL | Status: DC
  Administered 2021-07-03 – 2021-07-06 (×4): 2 mg via ORAL
  Filled 2021-07-03 (×4): qty 1

## 2021-07-03 MED ORDER — LAMOTRIGINE 100 MG PO TABS
50.0000 mg | ORAL_TABLET | Freq: Two times a day (BID) | ORAL | Status: DC
  Administered 2021-07-03 – 2021-07-06 (×6): 50 mg via ORAL
  Filled 2021-07-03: qty 2
  Filled 2021-07-03 (×3): qty 1
  Filled 2021-07-03: qty 2
  Filled 2021-07-03: qty 1

## 2021-07-03 MED ORDER — POTASSIUM CHLORIDE CRYS ER 20 MEQ PO TBCR
20.0000 meq | EXTENDED_RELEASE_TABLET | Freq: Once | ORAL | Status: AC
  Administered 2021-07-03: 20 meq via ORAL
  Filled 2021-07-03: qty 1

## 2021-07-03 MED ORDER — TRAZODONE HCL 50 MG PO TABS
50.0000 mg | ORAL_TABLET | Freq: Every day | ORAL | Status: DC
  Administered 2021-07-03 – 2021-07-05 (×3): 50 mg via ORAL
  Filled 2021-07-03 (×3): qty 1

## 2021-07-03 MED ORDER — DIPHENHYDRAMINE HCL 25 MG PO CAPS
25.0000 mg | ORAL_CAPSULE | Freq: Four times a day (QID) | ORAL | Status: DC | PRN
  Administered 2021-07-03 – 2021-07-05 (×3): 25 mg via ORAL
  Filled 2021-07-03 (×4): qty 1

## 2021-07-03 MED ORDER — FAMOTIDINE 20 MG PO TABS
20.0000 mg | ORAL_TABLET | Freq: Two times a day (BID) | ORAL | Status: DC
  Administered 2021-07-03 – 2021-07-06 (×6): 20 mg via ORAL
  Filled 2021-07-03 (×6): qty 1

## 2021-07-03 MED ORDER — HYDROXYZINE HCL 25 MG PO TABS
25.0000 mg | ORAL_TABLET | Freq: Once | ORAL | Status: AC
  Administered 2021-07-03: 25 mg via ORAL
  Filled 2021-07-03: qty 1

## 2021-07-03 MED ORDER — BUSPIRONE HCL 10 MG PO TABS
10.0000 mg | ORAL_TABLET | Freq: Two times a day (BID) | ORAL | Status: DC
  Administered 2021-07-03 – 2021-07-06 (×6): 10 mg via ORAL
  Filled 2021-07-03 (×6): qty 1

## 2021-07-03 MED ORDER — ACETAMINOPHEN 325 MG PO TABS
650.0000 mg | ORAL_TABLET | Freq: Four times a day (QID) | ORAL | Status: DC | PRN
  Administered 2021-07-03 – 2021-07-05 (×2): 650 mg via ORAL
  Filled 2021-07-03 (×2): qty 2

## 2021-07-03 NOTE — Progress Notes (Signed)
ANTICOAGULATION CONSULT NOTE  ? ?Pharmacy Consult for Heparin  ?Indication: pulmonary embolus ? ?Allergies  ?Allergen Reactions  ? Other Hives  ?  ABD pad causes rash ?Pt can only tolerate cloth tape ?Pt can only tolerate cloth tape  ? Shellfish Allergy Hives  ? Tapentadol Hives and Other (See Comments)  ?  Pt can only tolerate cloth tape  ? Tape Hives and Other (See Comments)  ?  Pt can only tolerate cloth tape  ? Ginger Hives  ? Morphine And Related   ?  " I become very angry"  ? Vancomycin Other (See Comments)  ?  Burns her vein really bad and always causes them to blow.  ? ? ?Patient Measurements: ?Height: 5\' 1"  (154.9 cm) ?Weight: 93.2 kg (205 lb 7.5 oz) ?IBW/kg (Calculated) : 47.8 ?Heparin Dosing Weight: 70 kg  ? ?Vital Signs: ?Temp: 98.3 ?F (36.8 ?C) (05/01 0800) ?Temp Source: Oral (05/01 0800) ?BP: 104/71 (05/01 1400) ?Pulse Rate: 91 (05/01 1300) ? ?Labs: ?Recent Labs  ?  07/01/21 ?2247 07/02/21 ?0157 07/02/21 ?[id number] 07/02/21 ?1624 07/03/21 ?0015 07/03/21 ?[id number] 07/03/21 ?1429  ?HGB 8.1*  --  11.3*  --  11.5*  --   --   ?HCT 26.9*  --  34.5*  --  35.3*  --   --   ?PLT 207  --  256  --  257  --   --   ?APTT 20*  --   --   --   --   --   --   ?LABPROT 14.5  --   --   --   --   --   --   ?INR 1.1  --   --   --   --   --   --   ?HEPARINUNFRC  --   --  0.27*   < > 0.29* 0.38 0.28*  ?CREATININE 0.61   < > 0.64  --  0.72 0.66  --   ? < > = values in this interval not displayed.  ? ? ? ?Estimated Creatinine Clearance: 109.1 mL/min (by C-G formula based on SCr of 0.66 mg/dL). ? ? ?Medical History: ?Past Medical History:  ?Diagnosis Date  ? ADHD (attention deficit hyperactivity disorder)   ? Bipolar disorder ([place])   ? Chronic tonsillitis 08/2013  ? current strep, will finish antibiotic 09/04/2013; snores during sleep, mother denies apnea  ? Family history of adverse reaction to anesthesia   ? " My cousin had problems waking up. "  ? Heart murmur   ? "when I was born; outgrew it; I was a preemie"  ? Hidradenitis   ?  Hidradenitis 2017  ? Migraine 12/19/2016  ? "I've just had this one for 3 days" (12/20/2016)  ? ? ?Medications:  ?Medications Prior to Admission  ?Medication Sig Dispense Refill Last Dose  ? ARIPiprazole (ABILIFY) 2 MG tablet Take 1 tablet (2 mg total) by mouth daily. 90 tablet 1 07/01/2021 at am  ? HYDROcodone-acetaminophen (NORCO/VICODIN) 5-325 MG tablet Take 1 to 2 tablets by mouth every 6 hours as needed for pain 15 tablet 0 07/01/2021 at unknown  ? hydrOXYzine (ATARAX) 25 MG tablet Take 1 tablet (25 mg total) by mouth every 8 (eight) hours as needed for itching. 90 tablet 1 07/01/2021 at unknown  ? lamoTRIgine (LAMICTAL) 100 MG tablet Take 0.5 tablets (50 mg total) by mouth 2 (two) times daily. 90 tablet 1 07/01/2021 at am  ? methylphenidate (RITALIN) 20 MG tablet Take 1 tablet (20 mg  total) by mouth daily. 30 tablet 0 07/01/2021 at am  ? montelukast (SINGULAIR) 10 MG tablet TAKE 1 TABLET BY MOUTH EVERYDAY AT BEDTIME 90 tablet 0 06/29/2021 at pm  ? predniSONE (DELTASONE) 20 MG tablet Take 20 mg by mouth daily.   07/01/2021 at am  ? busPIRone (BUSPAR) 10 MG tablet Take 1 tablet (10 mg total) by mouth 2 (two) times daily. 60 tablet 1   ? clindamycin (CLEOCIN) 300 MG capsule Take 300 mg by mouth 2 (two) times daily.     ? diazepam (VALIUM) 10 MG tablet Take 1 tablet 30 minutes prior to dermatology procedure   06/26/2021 at for procedure  ? diclofenac (VOLTAREN) 75 MG EC tablet Take by mouth.     ? diphenhydrAMINE (BENADRYL) 25 mg capsule Take by mouth.     ? drospirenone-ethinyl estradiol (YAZ) 3-0.02 MG tablet    06/24/2021 at am  ? EPINEPHrine (EPIPEN 2-PAK) 0.3 mg/0.3 mL IJ SOAJ injection Inject 0.3 mg into the muscle as needed for anaphylaxis. 1 each 0 prn at prn  ? ERTAPENEM SODIUM IV Inject into the vein.     ? fluconazole (DIFLUCAN) 150 MG tablet Take 150 mg by mouth once.     ? glucose blood (CONTOUR TEST) test strip And lancets #100 100 each 12   ? heparin lock flush 100 UNIT/ML SOLN injection Inject 2 mL (200  Units total) as directed as needed for line care. Flush using SASH (saline, administer  IV, saline, heparin) method as directed. Flush IV catheter with heparin after the last saline flush. ** Discard any unused excess in syringe as directed**.  Use syringe ONE TIME only then discard. ?Storage: Room Temperature.     ? ibuprofen (ADVIL) 800 MG tablet Take by mouth.     ? Iron-Vitamin C 100-250 MG TABS iron     ? methylphenidate (RITALIN) 20 MG tablet Take 10 mg by mouth daily as needed.     ? [START ON 07/24/2021] methylphenidate (RITALIN) 20 MG tablet Take 1 tablet (20 mg total) by mouth daily. 30 tablet 0   ? methylphenidate (RITALIN) 20 MG tablet Take 1 tablet (20 mg total) by mouth daily. 30 tablet 0   ? Microlet Lancets MISC See admin instructions.     ? ondansetron (ZOFRAN) 4 MG tablet Take 1 tablet (4 mg total) by mouth every 6 (six) hours. 12 tablet 0 prn at prn  ? sodium chloride flush 0.9 % SOLN injection Inject 5 mL as directed as needed for line care. Flush using SASH (saline, administer  IV, saline, heparin) method as directed. Use syringe one time only then discard syringe. *Saline is another name for Sodium Chloride 0.9%* Storage: Room Temperature.     ? sulfamethoxazole-trimethoprim (BACTRIM DS) 800-160 MG tablet Take 1 tablet by mouth 2 (two) times daily.     ? traZODone (DESYREL) 50 MG tablet TAKE 1 TABLET BY MOUTH EVERYDAY AT BEDTIME 90 tablet 0 06/29/2021 at hs  ? triamcinolone ointment (KENALOG) 0.1 % APPLY TO AFFECTED AREA TWICE A DAY     ? ? ?Assessment: 31 year old female found to have pulmonary embolism.  ? ?H&H stable. Baseline APTT 20. No prior anticoagulation noted. ? ?Date Time HL Rate/comment ?4/30  0829 0.27  Subtherapeutic, 1200 u/hr  ?4/30 1624 0.23 Subtherapeutic, 1350 un/hr ?5/01     0015    0.29    Subtherapeutic, 1500 units/hr ?5/01 0755 0.38 Therapeutic, 1650 units/hr ?5/01 1429 0.28 Subtherapeutic, 1650 units/hr ? ?Goal of Therapy:  ?  Heparin level 0.3-0.7 units/ml ?Monitor  platelets by anticoagulation protocol: Yes ?  ?Plan:  ?Give heparin bolus 1050 units and increase heparin infusion to 1800 units/hr ?Heparin level in 6 hours ?CBC daily ? ?Wynelle Cleveland, PharmD ?Pharmacy Resident  ?07/03/2021 ?3:18 PM ?

## 2021-07-03 NOTE — Consult Note (Addendum)
?[facility] ?Vascular Consult Note ? ?MRN : [id number] ? ?Courtney [name] is a 31 y.o. (1990/12/03) female who presents with chief complaint of  ?Chief Complaint  ?Patient presents with  ? Hypotension  ?. ? ? ?Consulting Physician: Cipriano BunkerPardeep Kumar, MD ?Reason for consult: Pulmonary embolism and consideration for IVC filter placement ?History of Present Illness: Courtney KosBrandy [name] is a 31 year old female that presented to [facility] due to altered mental state.  She has been receiving IV antibiotics due to hidradenitis suppurativa.  She noted some increased work of breathing as well.  There is noted redness to her PICC line and subsequently admitted following a leukocytosis and profound lactic acidosis.  In the midst of the patient's work-up it was found that she had a pulmonary embolism although not significant.  We were asked to consult for consideration of possible IVC filter.  Bilateral DVT studies done today show no evidence of DVT bilaterally.  Patient was previously using Yaz birth control and notes that she was vaping but not using actual cigarettes.  She also notes a family history of clotting with both her grandmother and mother having previous blood clots. ? ?Current Facility-Administered Medications  ?Medication Dose Route Frequency Provider Last Rate Last Admin  ? Chlorhexidine Gluconate Cloth 2 % PADS 6 each  6 each Topical Daily Erin FullingKasa, Kurian, MD   6 each at 07/02/21 1449  ? docusate sodium (COLACE) capsule 100 mg  100 mg Oral BID PRN Rust-Chester, Micheline RoughBritton L, NP      ? famotidine (PEPCID) tablet 20 mg  20 mg Oral BID Tressie EllisChappell, Alex B, RPH      ? heparin ADULT infusion 100 units/mL (25000 units/24250mL)  1,650 Units/hr Intravenous Continuous Erin FullingKasa, Kurian, MD 16.5 mL/hr at 07/03/21 1111 1,650 Units/hr at 07/03/21 1111  ? meropenem (MERREM) 1 g in sodium chloride 0.9 % 100 mL IVPB  1 g Intravenous Q8H Kasa, Wallis BambergKurian, MD 200 mL/hr at 07/03/21 1313 1 g at 07/03/21 1313  ?  polyethylene glycol (MIRALAX / GLYCOLAX) packet 17 g  17 g Oral Daily PRN Rust-Chester, Britton L, NP      ? vancomycin (VANCOREADY) IVPB 750 mg/150 mL  750 mg Intravenous Q12H Erin FullingKasa, Kurian, MD 75 mL/hr at 07/03/21 1108 750 mg at 07/03/21 1108  ? ? ?Past Medical History:  ?Diagnosis Date  ? ADHD (attention deficit hyperactivity disorder)   ? Bipolar disorder (HCC)   ? Chronic tonsillitis 08/2013  ? current strep, will finish antibiotic 09/04/2013; snores during sleep, mother denies apnea  ? Family history of adverse reaction to anesthesia   ? " My cousin had problems waking up. "  ? Heart murmur   ? "when I was born; outgrew it; I was a preemie"  ? Hidradenitis   ? Hidradenitis 2017  ? Migraine 12/19/2016  ? "I've just had this one for 3 days" (12/20/2016)  ? ? ?Past Surgical History:  ?Procedure Laterality Date  ? AXILLARY HIDRADENITIS EXCISION    ? AXILLARY LYMPH NODE BIOPSY Left 01/06/2019  ? CYSTOSCOPY W/ URETERAL STENT PLACEMENT Right 05/28/2017  ? Procedure: CYSTOSCOPY WITH RETROGRADE PYELOGRAM/ RIGHT URETERAL STENT PLACEMENT;  Surgeon: Crist FatHerrick, Benjamin W, MD;  Location: [facility];  Service: Urology;  Laterality: Right;  ? HYDRADENITIS EXCISION Bilateral 04/03/2017  ? Procedure: EXCISION AND DRAINAGE BILATERAL  HIDRADENITIS AXILLA;  Surgeon: Griselda Mineroth, Paul III, MD;  [facility];  Service: General;  Laterality: Bilateral;  ? INCISION AND DRAINAGE ABSCESS Right 07/20/2016  ? Procedure: INCISION  AND DRAINAGE ABSCESS right axilla;  Surgeon: Abigail Miyamoto, MD;  Location: [facility] OR;  Service: General;  Laterality: Right;  ? INTRAUTERINE DEVICE (IUD) INSERTION  11/2016  ? "had the one in my left arm removed"  ? IRRIGATION AND DEBRIDEMENT ABSCESS Right 12/21/2016  ? Procedure: IRRIGATION AND DEBRIDEMENT RIGHT AXILLARY HIDRADENITIS;  Surgeon: Andria Meuse, MD;  Location: [facility] OR;  Service: General;  Laterality: Right;  ? IRRIGATION AND DEBRIDEMENT ABSCESS Left 01/08/2017  ? Procedure: IRRIGATION AND DEBRIDEMENT AXILLARY  ABSCESS;  Surgeon: Griselda Miner, MD;  Location: [facility] OR;  Service: General;  Laterality: Left;  ? NASAL SEPTOPLASTY W/ TURBINOPLASTY Bilateral 02/25/2019  ? Procedure: NASAL SEPTOPLASTY WITH TURBINATE REDUCTION;  Surgeon: Serena Colonel, MD;  Location: [facility];  Service: ENT;  Laterality: Bilateral;  ? TONSILLECTOMY Bilateral 09/14/2013  ? Procedure: BILATERAL TONSILLECTOMY;  Surgeon: Serena Colonel, MD;  Location: [facility];  Service: ENT;  Laterality: Bilateral;  ? TYMPANOPLASTY Bilateral   ? "rebuilt eardrums"  ? WISDOM TOOTH EXTRACTION    ? ? ?Social History ?Social History  ? ?Tobacco Use  ? Smoking status: Never  ? Smokeless tobacco: Never  ?Vaping Use  ? Vaping Use: Former  ?Substance Use Topics  ? Alcohol use: Not Currently  ? Drug use: Yes  ?  Types: Marijuana  ?  Comment: in high school  ? ? ?Family History ?Family History  ?Problem Relation Age of Onset  ? Hypertension Mother   ? Hyperlipidemia Mother   ? Diabetes Mother   ? Heart attack Father 31  ? Parkinson's disease Maternal Grandmother   ? Dementia Maternal Grandmother   ? Diabetes Maternal Grandmother   ? Bell's palsy Maternal Grandmother   ? Alcohol abuse Paternal Uncle   ? ? ?Allergies  ?Allergen Reactions  ? Other Hives  ?  ABD pad causes rash ?Pt can only tolerate cloth tape ?Pt can only tolerate cloth tape  ? Shellfish Allergy Hives  ? Tapentadol Hives and Other (See Comments)  ?  Pt can only tolerate cloth tape  ? Tape Hives and Other (See Comments)  ?  Pt can only tolerate cloth tape  ? Ginger Hives  ? Morphine And Related   ?  " I become very angry"  ? Vancomycin Other (See Comments)  ?  Burns her vein really bad and always causes them to blow.  ? ? ? ?REVIEW OF SYSTEMS (Negative unless checked) ? ?Constitutional: [] Weight loss  [] Fever  [] Chills ?Cardiac: [] Chest pain   [] Chest pressure   [] Palpitations   [] Shortness of breath when laying flat   [] Shortness of breath at rest   [] Shortness of breath with  exertion. ?Vascular:  [] Pain in legs with walking   [] Pain in legs at rest   [] Pain in legs when laying flat   [] Claudication   [] Pain in feet when walking  [] Pain in feet at rest  [] Pain in feet when laying flat   [] History of DVT   [] Phlebitis   [] Swelling in legs   [] Varicose veins   [] Non-healing ulcers ?Pulmonary:   [] Uses home oxygen   [] Productive cough   [] Hemoptysis   [] Wheeze  [] COPD   [] Asthma ?Neurologic:  [] Dizziness  [] Blackouts   [] Seizures   [] History of stroke   [] History of TIA  [] Aphasia   [] Temporary blindness   [] Dysphagia   [] Weakness or numbness in arms   [] Weakness or numbness in legs ?Musculoskeletal:  [] Arthritis   [] Joint swelling   [] Joint pain   []   Low back pain ?Hematologic:  [] Easy bruising  [] Easy bleeding   [] Hypercoagulable state   [] Anemic  [] Hepatitis ?Gastrointestinal:  [] Blood in stool   [] Vomiting blood  [] Gastroesophageal reflux/heartburn   [] Difficulty swallowing. ?Genitourinary:  [] Chronic kidney disease   [] Difficult urination  [] Frequent urination  [] Burning with urination   [] Blood in urine ?Skin:  [] Rashes   [] Ulcers   [] Wounds ?Psychological:  [] History of anxiety   []  History of major depression. ? ?Physical Examination ? ?Vitals:  ? 07/03/21 0700 07/03/21 0800 07/03/21 0900 07/03/21 1000  ?BP: 90/63 96/71 (!) 90/58 101/63  ?Pulse: 79 77 84 (!) 102  ?Resp: 18 (!) 21 20 20   ?Temp:  98.3 ?F (36.8 ?C)    ?TempSrc:  Oral    ?SpO2: 97% 96% 97% 97%  ?Weight:      ?Height:      ? ?Body mass index is 38.82 kg/m?. ?Gen:  WD/WN, NAD ?Head: [place]/AT, No temporalis wasting. Prominent temp pulse not noted. ?Ear/Nose/Throat: Hearing grossly intact, nares w/o erythema or drainage, oropharynx w/o Erythema/Exudate ?Eyes: Sclera non-icteric, conjunctiva clear ?Neck: Trachea midline.  No JVD.  ?Pulmonary:  Good air movement, respirations not labored, equal bilaterally.  ?Cardiac: RRR, normal S1, S2. ?Vascular: No edema noted bilaterally ? ?Gastrointestinal: soft, non-tender/non-distended. No  guarding/reflex.  ?Musculoskeletal: M/S 5/5 throughout.  Extremities without ischemic changes.  No deformity or atrophy. No edema. ?Neurologic: Sensation grossly intact in extremities.  Symmetrical.  Speech is

## 2021-07-03 NOTE — Progress Notes (Signed)
Pharmacy Antibiotic Note ? ?Courtney [name] is a 31 y.o. female admitted on 07/01/2021 with sepsis and pulmonary embolism. Concern for CLABSI vs skin infection. Patient has history of hidradenitis supprativa, as well as chronic outpatient PICC line. Pharmacy has been consulted for Vancomycin, meropenem dosing.   ? ?Pt reported history of allergy to vancomycin (burns veins).  Will double infusion time to avoid this reaction.  ? ?Plan: ?Continue vancomycin 750 mg every 12 hours ?Goal AUC 400-600  ?Scr 0.8, IBW for CrCl/Ke, Vd 0.5 L/kg ?Estimated AUC 460, Cmin 13.1 ? ?Continue meropenem 1 gram every 8 hours ? ?Monitor renal function, clinical course, length of therapy.Follow up surgery assessment. ?Height: 5\' 1"  (154.9 cm) ?Weight: 93.2 kg (205 lb 7.5 oz) ?IBW/kg (Calculated) : 47.8 ? ?Temp (24hrs), Avg:98.5 ?F (36.9 ?C), Min:98.3 ?F (36.8 ?C), Max:98.9 ?F (37.2 ?C) ? ?Recent Labs  ?Lab 07/01/21 ?2247 07/02/21 ?0157 07/02/21 ?07/04/21 07/02/21 ?1153 07/03/21 ?0015 07/03/21 ?09/02/21  ?WBC 18.3*  --  19.5*  --  14.2*  --   ?CREATININE 0.61 0.74 0.64  --  0.72 0.66  ?LATICACIDVEN >9.0* 3.0*  --  2.1*  --   --   ? ?  ?Estimated Creatinine Clearance: 109.1 mL/min (by C-G formula based on SCr of 0.66 mg/dL).   ? ?Allergies  ?Allergen Reactions  ? Other Hives  ?  ABD pad causes rash ?Pt can only tolerate cloth tape ?Pt can only tolerate cloth tape  ? Shellfish Allergy Hives  ? Tapentadol Hives and Other (See Comments)  ?  Pt can only tolerate cloth tape  ? Tape Hives and Other (See Comments)  ?  Pt can only tolerate cloth tape  ? Ginger Hives  ? Morphine And Related   ?  " I become very angry"  ? Vancomycin Other (See Comments)  ?  Burns her vein really bad and always causes them to blow.  ? ? ?Antimicrobials this admission: ? 4/29 vancomycin >>  ?4/30 meropenem  >>  ? ?Dose adjustments this admission: ? ? ?Microbiology results: ? BCx:NG x 2 d  ? UCx:  NG ? MRSA PCR: not detected ? ?Thank you for allowing pharmacy to be a part of this  patient?s care. ? ?5/30, PharmD ?Pharmacy Resident  ?07/03/2021 ?12:27 PM ?

## 2021-07-03 NOTE — Progress Notes (Signed)
ANTICOAGULATION CONSULT NOTE  ? ?Pharmacy Consult for Heparin  ?Indication: pulmonary embolus ? ?Allergies  ?Allergen Reactions  ? Other Hives  ?  ABD pad causes rash ?Pt can only tolerate cloth tape ?Pt can only tolerate cloth tape  ? Shellfish Allergy Hives  ? Tapentadol Hives and Other (See Comments)  ?  Pt can only tolerate cloth tape  ? Tape Hives and Other (See Comments)  ?  Pt can only tolerate cloth tape  ? Ginger Hives  ? Morphine And Related   ?  " I become very angry"  ? Vancomycin Other (See Comments)  ?  Burns her vein really bad and always causes them to blow.  ? ? ?Patient Measurements: ?Height: 5\' 1"  (154.9 cm) ?Weight: 93.2 kg (205 lb 7.5 oz) ?IBW/kg (Calculated) : 47.8 ?Heparin Dosing Weight: 70 kg  ? ?Vital Signs: ?BP: 91/74 (05/01 0200) ?Pulse Rate: 85 (05/01 0200) ? ?Labs: ?Recent Labs  ?  07/01/21 ?2247 07/02/21 ?0157 07/02/21 ?07/04/21 07/02/21 ?1624 07/03/21 ?0015 07/03/21 ?09/02/21  ?HGB 8.1*  --  11.3*  --  11.5*  --   ?HCT 26.9*  --  34.5*  --  35.3*  --   ?PLT 207  --  256  --  257  --   ?APTT 20*  --   --   --   --   --   ?LABPROT 14.5  --   --   --   --   --   ?INR 1.1  --   --   --   --   --   ?HEPARINUNFRC  --    < > 0.27* 0.23* 0.29* 0.38  ?CREATININE 0.61   < > 0.64  --  0.72 0.66  ? < > = values in this interval not displayed.  ? ? ? ?Estimated Creatinine Clearance: 109.1 mL/min (by C-G formula based on SCr of 0.66 mg/dL). ? ? ?Medical History: ?Past Medical History:  ?Diagnosis Date  ? ADHD (attention deficit hyperactivity disorder)   ? Bipolar disorder (HCC)   ? Chronic tonsillitis 08/2013  ? current strep, will finish antibiotic 09/04/2013; snores during sleep, mother denies apnea  ? Family history of adverse reaction to anesthesia   ? " My cousin had problems waking up. "  ? Heart murmur   ? "when I was born; outgrew it; I was a preemie"  ? Hidradenitis   ? Hidradenitis 2017  ? Migraine 12/19/2016  ? "I've just had this one for 3 days" (12/20/2016)  ? ? ?Medications:  ?Medications Prior  to Admission  ?Medication Sig Dispense Refill Last Dose  ? ARIPiprazole (ABILIFY) 2 MG tablet Take 1 tablet (2 mg total) by mouth daily. 90 tablet 1 07/01/2021 at am  ? HYDROcodone-acetaminophen (NORCO/VICODIN) 5-325 MG tablet Take 1 to 2 tablets by mouth every 6 hours as needed for pain 15 tablet 0 07/01/2021 at unknown  ? hydrOXYzine (ATARAX) 25 MG tablet Take 1 tablet (25 mg total) by mouth every 8 (eight) hours as needed for itching. 90 tablet 1 07/01/2021 at unknown  ? lamoTRIgine (LAMICTAL) 100 MG tablet Take 0.5 tablets (50 mg total) by mouth 2 (two) times daily. 90 tablet 1 07/01/2021 at am  ? methylphenidate (RITALIN) 20 MG tablet Take 1 tablet (20 mg total) by mouth daily. 30 tablet 0 07/01/2021 at am  ? montelukast (SINGULAIR) 10 MG tablet TAKE 1 TABLET BY MOUTH EVERYDAY AT BEDTIME 90 tablet 0 06/29/2021 at pm  ? predniSONE (DELTASONE) 20 MG tablet  Take 20 mg by mouth daily.   07/01/2021 at am  ? busPIRone (BUSPAR) 10 MG tablet Take 1 tablet (10 mg total) by mouth 2 (two) times daily. 60 tablet 1   ? clindamycin (CLEOCIN) 300 MG capsule Take 300 mg by mouth 2 (two) times daily.     ? diazepam (VALIUM) 10 MG tablet Take 1 tablet 30 minutes prior to dermatology procedure   06/26/2021 at for procedure  ? diclofenac (VOLTAREN) 75 MG EC tablet Take by mouth.     ? diphenhydrAMINE (BENADRYL) 25 mg capsule Take by mouth.     ? drospirenone-ethinyl estradiol (YAZ) 3-0.02 MG tablet    06/24/2021 at am  ? EPINEPHrine (EPIPEN 2-PAK) 0.3 mg/0.3 mL IJ SOAJ injection Inject 0.3 mg into the muscle as needed for anaphylaxis. 1 each 0 prn at prn  ? ERTAPENEM SODIUM IV Inject into the vein.     ? fluconazole (DIFLUCAN) 150 MG tablet Take 150 mg by mouth once.     ? glucose blood (CONTOUR TEST) test strip And lancets #100 100 each 12   ? heparin lock flush 100 UNIT/ML SOLN injection Inject 2 mL (200 Units total) as directed as needed for line care. Flush using SASH (saline, administer  IV, saline, heparin) method as directed. Flush  IV catheter with heparin after the last saline flush. ** Discard any unused excess in syringe as directed**.  Use syringe ONE TIME only then discard. ?Storage: Room Temperature.     ? ibuprofen (ADVIL) 800 MG tablet Take by mouth.     ? Iron-Vitamin C 100-250 MG TABS iron     ? methylphenidate (RITALIN) 20 MG tablet Take 10 mg by mouth daily as needed.     ? [START ON 07/24/2021] methylphenidate (RITALIN) 20 MG tablet Take 1 tablet (20 mg total) by mouth daily. 30 tablet 0   ? methylphenidate (RITALIN) 20 MG tablet Take 1 tablet (20 mg total) by mouth daily. 30 tablet 0   ? Microlet Lancets MISC See admin instructions.     ? ondansetron (ZOFRAN) 4 MG tablet Take 1 tablet (4 mg total) by mouth every 6 (six) hours. 12 tablet 0 prn at prn  ? sodium chloride flush 0.9 % SOLN injection Inject 5 mL as directed as needed for line care. Flush using SASH (saline, administer  IV, saline, heparin) method as directed. Use syringe one time only then discard syringe. *Saline is another name for Sodium Chloride 0.9%* Storage: Room Temperature.     ? sulfamethoxazole-trimethoprim (BACTRIM DS) 800-160 MG tablet Take 1 tablet by mouth 2 (two) times daily.     ? traZODone (DESYREL) 50 MG tablet TAKE 1 TABLET BY MOUTH EVERYDAY AT BEDTIME 90 tablet 0 06/29/2021 at hs  ? triamcinolone ointment (KENALOG) 0.1 % APPLY TO AFFECTED AREA TWICE A DAY     ? ? ?Assessment: 31 year old female found to have pulmonary embolism.  ? ?H&H stable. Baseline APTT 20. No prior anticoagulation noted. ? ?Date Time HL Rate/comment ?4/30  0829 0.27  Subtherapeutic, 1200 u/hr  ?4/30 1624 0.23 Subtherapeutic, 1350 un/hr ?5/01     0015    0.29    Subtherapeutic, 1500 units/hr ?5/01 0755 0.38 Therapeutic, 1650 units/hr ? ?Goal of Therapy:  ?Heparin level 0.3-0.7 units/ml ?Monitor platelets by anticoagulation protocol: Yes ?  ?Plan:  ?Continue heparin infusion at 1650 units/hr.  ?Heparin level in 6 hours ?CBC daily ? ?Jaynie Bream, PharmD ?Pharmacy Resident   ?07/03/2021 ?8:02 AM ? ?

## 2021-07-03 NOTE — Progress Notes (Signed)
?PROGRESS NOTE ? ? ? ?Courtney [name]  [id number] DOB: 1990/04/26 DOA: 07/01/2021 ? ?PCP: Lesleigh Noe, MD  ? ? ?Brief Narrative: ?This 31 years old female presented in the ED with complaints of altered mentation drowsiness and possible syncope.  History is obtained from mother who reports patient has been self administering ertapenem IV through PICC line due to infected ulcers on her left thigh and right breast from hidradenitis.  She was found more lethargic than usual and told her mom that she is not feeling well.  EMS was called.  Afterwards mother has found high-dose THC Gummies with packaging showing that each gummy is 350 mg.  Mom is unsure about any other recreational drug use habits and has been suspicious that patient might smoke cigarettes and is unsure.  Patient was lethargic on arrival but protecting her airway.  Sepsis protocol was initiated.  Labs showed leukocytosis, lactic acidosis more than 9.  ?CTA chest shows lobar segmental bilateral lower lobe pulmonary emboli.  Overall clot burden is small to moderate no evidence of right heart strain.  CT abdomen pelvis negative for any acute abnormality. ?Patient was initially admitted under ICU for severe sepsis and altered mental status at high risk for intubation.  Patient has improved.  Started on heparin gtt, and IV antibiotics. ?PCCM pickup 07/03/2021 ? ?Assessment & Plan: ?  ?Principal Problem: ?  Sepsis ([place]) ? ?Suspected sepsis without septic shock: ?Skin infection in the setting of chronic outpatient PICC placement and hidradenitis suppuratica. ?Presented with leukocytosis, lactic acidosis, skin infections, tachycardia, tachypnea. ?Lactic acidosis improving with IV hydration. ?Initiated on empiric antibiotics ( meropenem and vancomycin) ?Continue supplemental oxygen and wean as tolerated. ?Continue IV hydration RL at 100 cc. ?Sepsis physiology improving. ?Follow-up cultures.  Procalcitonin 0.10 ?C. difficile antigen positive toxin PCR  negative ? ?Bilateral lower lobe pulmonary embolism in the setting of birth control pills. ?Continue heparin gtt. as per pharmacy protocol. ?Continue supplemental oxygen admitted saturation above 90% ?Lower extremity DVT negative for thrombosis. ?Consider hypercoagulable panel (factor V [place], lupus anticoagulant, homocystine, Antithrombin III, protein C and protein S) ?2D echocardiogram shows LVEF 60 to 65%. ?Vascular surgery consulted to determine need for IVC filter ? ?Acute encephalopathy  ?suspected ingestion of high concentration THC Gummies. ?Continue supportive care. ?There was a concern for deterioration,  risk of intubation ?Avoid sedating medications. ?Acute encephalopathy resolved. ? ? ?DVT prophylaxis: Heparin gtt ?Code Status: Full code. ?Family Communication:  Mother at bed side. ?Disposition Plan:  ?Status is: Inpatient ?Remains inpatient appropriate because:  ?Admitted for bilateral PE requiring IV heparin, also found to have suspected sepsis requiring IV antibiotics in the setting of PICC line. ?  ? ?Consultants:  ?Vascular surgery ? ?Procedures: CTA chest, CT abdomen pelvis, echocardiogram ?Antimicrobials: ?Vancomycin, meropenum ? ?Subjective: ?Patient was seen and examined at bedside.  Overnight events noted.   ?Patient reports feeling better. She is alert and oriented x 3,  following full commands. ? ?Objective: ?Vitals:  ? 07/03/21 0700 07/03/21 0800 07/03/21 0900 07/03/21 1000  ?BP: 90/63 96/71 (!) 90/58 101/63  ?Pulse: 79 77 84 (!) 102  ?Resp: 18 (!) 21 20 20   ?Temp:  98.3 ?F (36.8 ?C)    ?TempSrc:  Oral    ?SpO2: 97% 96% 97% 97%  ?Weight:      ?Height:      ? ? ?Intake/Output Summary (Last 24 hours) at 07/03/2021 1343 ?Last data filed at 07/03/2021 1111 ?Gross per 24 hour  ?Intake 2184.71 ml  ?Output 1350 ml  ?  Net 834.71 ml  ? ?Filed Weights  ? 07/02/21 0945 07/02/21 1415 07/03/21 0500  ?Weight: 93.8 kg 94.7 kg 93.2 kg  ? ? ?Examination: ? ?General exam: Appears comfortable, not in any acute  distress. ?Respiratory system: CTA bilaterally, no wheezing, no crackles, normal respiratory effort. ?Cardiovascular system: S1 & S2 heard, regular rate and rhythm, no murmur. ?Gastrointestinal system: Abdomen is soft, non tender, non distended, BS+ ?Central nervous system: Alert and oriented x 3 . No focal neurological deficits. ?Extremities: no Edema, no cyanosis, no clubbing ?Skin: Ulceration in the left upper thigh,  under right breast , left axilla, PICC line noted in the right arm ?Psychiatry: Judgement and insight appear normal. Mood & affect appropriate.  ? ? ? ?Data Reviewed: I have personally reviewed following labs and imaging studies ? ?CBC: ?Recent Labs  ?Lab 07/01/21 ?2247 07/02/21 ?[id number] 07/03/21 ?0015  ?WBC 18.3* 19.5* 14.2*  ?NEUTROABS 13.3*  --   --   ?HGB 8.1* 11.3* 11.5*  ?HCT 26.9* 34.5* 35.3*  ?MCV 95.4 89.4 89.4  ?PLT 207 256 257  ? ?Basic Metabolic Panel: ?Recent Labs  ?Lab 07/01/21 ?2247 07/02/21 ?0157 07/02/21 ?[id number] 07/03/21 ?0015 07/03/21 ?[id number]  ?NA 135 136 137  --  136  ?K 4.2 4.1 4.1  --  3.8  ?CL 106 104 104  --  101  ?CO2 18* 24 24  --  26  ?GLUCOSE 76 90 84  --  82  ?BUN 11 13 11   --  10  ?CREATININE 0.61 0.74 0.64 0.72 0.66  ?CALCIUM 7.6* 8.5* 8.8*  --  8.8*  ?MG  --  1.6*  --  2.1  --   ?PHOS  --  3.5  --   --   --   ? ?GFR: ?Estimated Creatinine Clearance: 109.1 mL/min (by C-G formula based on SCr of 0.66 mg/dL). ?Liver Function Tests: ?Recent Labs  ?Lab 07/01/21 ?2247 07/02/21 ?0157  ?AST 13* 18  ?ALT 11 13  ?ALKPHOS 37* 54  ?BILITOT 0.2* 0.4  ?PROT 4.9* 6.3*  ?ALBUMIN 2.0* 2.5*  ? ?No results for input(s): LIPASE, AMYLASE in the last 168 hours. ?No results for input(s): AMMONIA in the last 168 hours. ?Coagulation Profile: ?Recent Labs  ?Lab 07/01/21 ?2247  ?INR 1.1  ? ?Cardiac Enzymes: ?No results for input(s): CKTOTAL, CKMB, CKMBINDEX, TROPONINI in the last 168 hours. ?BNP (last 3 results) ?No results for input(s): PROBNP in the last 8760 hours. ?HbA1C: ?No results for input(s):  HGBA1C in the last 72 hours. ?CBG: ?Recent Labs  ?Lab 07/03/21 ?0028 07/03/21 ?[id number] 07/03/21 ?0809 07/03/21 ?[id number] 07/03/21 ?1130  ?GLUCAP 72 80 84 79 119*  ? ?Lipid Profile: ?No results for input(s): CHOL, HDL, LDLCALC, TRIG, CHOLHDL, LDLDIRECT in the last 72 hours. ?Thyroid Function Tests: ?No results for input(s): TSH, T4TOTAL, FREET4, T3FREE, THYROIDAB in the last 72 hours. ?Anemia Panel: ?No results for input(s): VITAMINB12, FOLATE, FERRITIN, TIBC, IRON, RETICCTPCT in the last 72 hours. ?Sepsis Labs: ?Recent Labs  ?Lab 07/01/21 ?2247 07/02/21 ?0157 07/02/21 ?1153 07/03/21 ?0015  ?PROCALCITON  --  <0.10  --  <0.10  ?LATICACIDVEN >9.0* 3.0* 2.1*  --   ? ? ?Recent Results (from the past 240 hour(s))  ?Resp Panel by RT-PCR (Flu A&B, Covid) Nasopharyngeal Swab     Status: None  ? Collection Time: 07/01/21 10:46 PM  ? Specimen: Nasopharyngeal Swab; Nasopharyngeal(NP) swabs in vial transport medium  ?Result Value Ref Range Status  ? SARS Coronavirus 2 by RT PCR NEGATIVE NEGATIVE Final  ?  Comment: (NOTE) ?SARS-CoV-2 target nucleic acids are NOT DETECTED. ? ?The SARS-CoV-2 RNA is generally detectable in upper respiratory ?specimens during the acute phase of infection. The lowest ?concentration of SARS-CoV-2 viral copies this assay can detect is ?138 copies/mL. A negative result does not preclude SARS-Cov-2 ?infection and should not be used as the sole basis for treatment or ?other patient management decisions. A negative result may occur with  ?improper specimen collection/handling, submission of specimen other ?than nasopharyngeal swab, presence of viral mutation(s) within the ?areas targeted by this assay, and inadequate number of viral ?copies(<138 copies/mL). A negative result must be combined with ?clinical observations, patient history, and epidemiological ?information. The expected result is Negative. ? ?Fact Sheet for Patients:  ?EntrepreneurPulse.com.au ? ?Fact Sheet for Healthcare Providers:   ?IncredibleEmployment.be ? ?This test is no t yet approved or cleared by the [place] FDA and  ?has been authorized for detection and/or diagnosis of SARS-CoV-2 by ?FDA under an Emergency U

## 2021-07-03 NOTE — Progress Notes (Signed)
ANTICOAGULATION CONSULT NOTE  ? ?Pharmacy Consult for Heparin  ?Indication: pulmonary embolus ? ?Allergies  ?Allergen Reactions  ? Other Hives  ?  ABD pad causes rash ?Pt can only tolerate cloth tape ?Pt can only tolerate cloth tape  ? Shellfish Allergy Hives  ? Tapentadol Hives and Other (See Comments)  ?  Pt can only tolerate cloth tape  ? Tape Hives and Other (See Comments)  ?  Pt can only tolerate cloth tape  ? Ginger Hives  ? Morphine And Related   ?  " I become very angry"  ? Vancomycin Other (See Comments)  ?  Burns her vein really bad and always causes them to blow.  ? ? ?Patient Measurements: ?Height: 5\' 1"  (154.9 cm) ?Weight: 94.7 kg (208 lb 12.4 oz) ?IBW/kg (Calculated) : 47.8 ?Heparin Dosing Weight: 70 kg  ? ?Vital Signs: ?Temp: 98.5 ?F (36.9 ?C) (04/30 2000) ?Temp Source: Oral (04/30 2000) ?BP: 98/69 (04/30 2000) ?Pulse Rate: 92 (04/30 1600) ? ?Labs: ?Recent Labs  ?  07/01/21 ?2247 07/02/21 ?0157 07/02/21 ?07/04/21 07/02/21 ?1624 07/03/21 ?0015  ?HGB 8.1*  --  11.3*  --  11.5*  ?HCT 26.9*  --  34.5*  --  35.3*  ?PLT 207  --  256  --  257  ?APTT 20*  --   --   --   --   ?LABPROT 14.5  --   --   --   --   ?INR 1.1  --   --   --   --   ?HEPARINUNFRC  --   --  0.27* 0.23* 0.29*  ?CREATININE 0.61 0.74 0.64  --   --   ? ? ? ?Estimated Creatinine Clearance: 110.1 mL/min (by C-G formula based on SCr of 0.64 mg/dL). ? ? ?Medical History: ?Past Medical History:  ?Diagnosis Date  ? ADHD (attention deficit hyperactivity disorder)   ? Bipolar disorder ([facility])   ? Chronic tonsillitis 08/2013  ? current strep, will finish antibiotic 09/04/2013; snores during sleep, mother denies apnea  ? Family history of adverse reaction to anesthesia   ? " My cousin had problems waking up. "  ? Heart murmur   ? "when I was born; outgrew it; I was a preemie"  ? Hidradenitis   ? Hidradenitis 2017  ? Migraine 12/19/2016  ? "I've just had this one for 3 days" (12/20/2016)  ? ? ?Medications:  ?Medications Prior to Admission  ?Medication Sig  Dispense Refill Last Dose  ? ARIPiprazole (ABILIFY) 2 MG tablet Take 1 tablet (2 mg total) by mouth daily. 90 tablet 1 07/01/2021 at am  ? HYDROcodone-acetaminophen (NORCO/VICODIN) 5-325 MG tablet Take 1 to 2 tablets by mouth every 6 hours as needed for pain 15 tablet 0 07/01/2021 at unknown  ? hydrOXYzine (ATARAX) 25 MG tablet Take 1 tablet (25 mg total) by mouth every 8 (eight) hours as needed for itching. 90 tablet 1 07/01/2021 at unknown  ? lamoTRIgine (LAMICTAL) 100 MG tablet Take 0.5 tablets (50 mg total) by mouth 2 (two) times daily. 90 tablet 1 07/01/2021 at am  ? methylphenidate (RITALIN) 20 MG tablet Take 1 tablet (20 mg total) by mouth daily. 30 tablet 0 07/01/2021 at am  ? montelukast (SINGULAIR) 10 MG tablet TAKE 1 TABLET BY MOUTH EVERYDAY AT BEDTIME 90 tablet 0 06/29/2021 at pm  ? predniSONE (DELTASONE) 20 MG tablet Take 20 mg by mouth daily.   07/01/2021 at am  ? busPIRone (BUSPAR) 10 MG tablet Take 1 tablet (10 mg  total) by mouth 2 (two) times daily. 60 tablet 1   ? clindamycin (CLEOCIN) 300 MG capsule Take 300 mg by mouth 2 (two) times daily.     ? diazepam (VALIUM) 10 MG tablet Take 1 tablet 30 minutes prior to dermatology procedure   06/26/2021 at for procedure  ? diclofenac (VOLTAREN) 75 MG EC tablet Take by mouth.     ? diphenhydrAMINE (BENADRYL) 25 mg capsule Take by mouth.     ? drospirenone-ethinyl estradiol (YAZ) 3-0.02 MG tablet    06/24/2021 at am  ? EPINEPHrine (EPIPEN 2-PAK) 0.3 mg/0.3 mL IJ SOAJ injection Inject 0.3 mg into the muscle as needed for anaphylaxis. 1 each 0 prn at prn  ? ERTAPENEM SODIUM IV Inject into the vein.     ? fluconazole (DIFLUCAN) 150 MG tablet Take 150 mg by mouth once.     ? glucose blood (CONTOUR TEST) test strip And lancets #100 100 each 12   ? heparin lock flush 100 UNIT/ML SOLN injection Inject 2 mL (200 Units total) as directed as needed for line care. Flush using SASH (saline, administer  IV, saline, heparin) method as directed. Flush IV catheter with heparin after  the last saline flush. ** Discard any unused excess in syringe as directed**.  Use syringe ONE TIME only then discard. ?Storage: Room Temperature.     ? ibuprofen (ADVIL) 800 MG tablet Take by mouth.     ? Iron-Vitamin C 100-250 MG TABS iron     ? methylphenidate (RITALIN) 20 MG tablet Take 10 mg by mouth daily as needed.     ? [START ON 07/24/2021] methylphenidate (RITALIN) 20 MG tablet Take 1 tablet (20 mg total) by mouth daily. 30 tablet 0   ? methylphenidate (RITALIN) 20 MG tablet Take 1 tablet (20 mg total) by mouth daily. 30 tablet 0   ? Microlet Lancets MISC See admin instructions.     ? ondansetron (ZOFRAN) 4 MG tablet Take 1 tablet (4 mg total) by mouth every 6 (six) hours. 12 tablet 0 prn at prn  ? sodium chloride flush 0.9 % SOLN injection Inject 5 mL as directed as needed for line care. Flush using SASH (saline, administer  IV, saline, heparin) method as directed. Use syringe one time only then discard syringe. *Saline is another name for Sodium Chloride 0.9%* Storage: Room Temperature.     ? sulfamethoxazole-trimethoprim (BACTRIM DS) 800-160 MG tablet Take 1 tablet by mouth 2 (two) times daily.     ? traZODone (DESYREL) 50 MG tablet TAKE 1 TABLET BY MOUTH EVERYDAY AT BEDTIME 90 tablet 0 06/29/2021 at hs  ? triamcinolone ointment (KENALOG) 0.1 % APPLY TO AFFECTED AREA TWICE A DAY     ? ? ?Assessment: ?Pharmacy consulted to dose heparin in this 31 year old female with PE.  No prior anticoag noted.   ?CrCl = 109.4 ml/min ? ?Date Time HL Rate/comment ?4/30  0829 0.27  Subtherapeutic, 1200 u/hr  ?4/30 1624 0.23 Subtherapeutic, 1350 un/hr ?5/01     0015    0.29    Subtherapeutic, 1500 units/hr ? ? ?Goal of Therapy:  ?Heparin level 0.3-0.7 units/ml ?Monitor platelets by anticoagulation protocol: Yes ?  ?Plan:  ?5/1: HL @ 0015 = 0.29, subtherapeutic  ?Will order heparin 1050 units IV X 1 and increase drip rate to 1650 units/hr.  ?Will recheck HL 6 hrs after rate change.  ? ?Evea Sheek D, PharmD ?07/03/2021,1:05  AM ? ? ?

## 2021-07-03 NOTE — Progress Notes (Signed)
ANTICOAGULATION CONSULT NOTE  ? ?Pharmacy Consult for Heparin  ?Indication: pulmonary embolus ? ?Allergies  ?Allergen Reactions  ? Other Hives  ?  ABD pad causes rash ?Pt can only tolerate cloth tape ?Pt can only tolerate cloth tape  ? Shellfish Allergy Hives  ? Tapentadol Hives and Other (See Comments)  ?  Pt can only tolerate cloth tape  ? Tape Hives and Other (See Comments)  ?  Pt can only tolerate cloth tape  ? Ginger Hives  ? Morphine And Related   ?  " I become very angry"  ? Vancomycin Other (See Comments)  ?  Burns her vein really bad and always causes them to blow.  ? ? ?Patient Measurements: ?Height: 5\' 1"  (154.9 cm) ?Weight: 93.2 kg (205 lb 7.5 oz) ?IBW/kg (Calculated) : 47.8 ?Heparin Dosing Weight: 70 kg  ? ?Vital Signs: ?Temp: 98.4 ?F (36.9 ?C) (05/01 1600) ?Temp Source: Oral (05/01 1600) ?BP: 113/75 (05/01 1800) ?Pulse Rate: 91 (05/01 1300) ? ?Labs: ?Recent Labs  ?  07/01/21 ?2247 07/02/21 ?0157 07/02/21 ?[id number] 07/02/21 ?1624 07/03/21 ?0015 07/03/21 ?[id number] 07/03/21 ?1429 07/03/21 ?2200  ?HGB 8.1*  --  11.3*  --  11.5*  --   --   --   ?HCT 26.9*  --  34.5*  --  35.3*  --   --   --   ?PLT 207  --  256  --  257  --   --   --   ?APTT 20*  --   --   --   --   --   --   --   ?LABPROT 14.5  --   --   --   --   --   --   --   ?INR 1.1  --   --   --   --   --   --   --   ?HEPARINUNFRC  --   --  0.27*   < > 0.29* 0.38 0.28* 0.26*  ?CREATININE 0.61   < > 0.64  --  0.72 0.66  --   --   ? < > = values in this interval not displayed.  ? ? ? ?Estimated Creatinine Clearance: 109.1 mL/min (by C-G formula based on SCr of 0.66 mg/dL). ? ? ?Medical History: ?Past Medical History:  ?Diagnosis Date  ? ADHD (attention deficit hyperactivity disorder)   ? Bipolar disorder (HCC)   ? Chronic tonsillitis 08/2013  ? current strep, will finish antibiotic 09/04/2013; snores during sleep, mother denies apnea  ? Family history of adverse reaction to anesthesia   ? " My cousin had problems waking up. "  ? Heart murmur   ? "when I was born;  outgrew it; I was a preemie"  ? Hidradenitis   ? Hidradenitis 2017  ? Migraine 12/19/2016  ? "I've just had this one for 3 days" (12/20/2016)  ? ? ?Medications:  ?Medications Prior to Admission  ?Medication Sig Dispense Refill Last Dose  ? ARIPiprazole (ABILIFY) 2 MG tablet Take 1 tablet (2 mg total) by mouth daily. 90 tablet 1 07/01/2021 at am  ? HYDROcodone-acetaminophen (NORCO/VICODIN) 5-325 MG tablet Take 1 to 2 tablets by mouth every 6 hours as needed for pain 15 tablet 0 07/01/2021 at unknown  ? hydrOXYzine (ATARAX) 25 MG tablet Take 1 tablet (25 mg total) by mouth every 8 (eight) hours as needed for itching. 90 tablet 1 07/01/2021 at unknown  ? lamoTRIgine (LAMICTAL) 100 MG tablet Take 0.5 tablets (50 mg total)  by mouth 2 (two) times daily. 90 tablet 1 07/01/2021 at am  ? methylphenidate (RITALIN) 20 MG tablet Take 1 tablet (20 mg total) by mouth daily. 30 tablet 0 07/01/2021 at am  ? montelukast (SINGULAIR) 10 MG tablet TAKE 1 TABLET BY MOUTH EVERYDAY AT BEDTIME 90 tablet 0 06/29/2021 at pm  ? predniSONE (DELTASONE) 20 MG tablet Take 20 mg by mouth daily.   07/01/2021 at am  ? busPIRone (BUSPAR) 10 MG tablet Take 1 tablet (10 mg total) by mouth 2 (two) times daily. 60 tablet 1   ? clindamycin (CLEOCIN) 300 MG capsule Take 300 mg by mouth 2 (two) times daily.     ? diazepam (VALIUM) 10 MG tablet Take 1 tablet 30 minutes prior to dermatology procedure   06/26/2021 at for procedure  ? diclofenac (VOLTAREN) 75 MG EC tablet Take by mouth.     ? diphenhydrAMINE (BENADRYL) 25 mg capsule Take by mouth.     ? drospirenone-ethinyl estradiol (YAZ) 3-0.02 MG tablet    06/24/2021 at am  ? EPINEPHrine (EPIPEN 2-PAK) 0.3 mg/0.3 mL IJ SOAJ injection Inject 0.3 mg into the muscle as needed for anaphylaxis. 1 each 0 prn at prn  ? ERTAPENEM SODIUM IV Inject into the vein.     ? fluconazole (DIFLUCAN) 150 MG tablet Take 150 mg by mouth once.     ? glucose blood (CONTOUR TEST) test strip And lancets #100 100 each 12   ? heparin lock  flush 100 UNIT/ML SOLN injection Inject 2 mL (200 Units total) as directed as needed for line care. Flush using SASH (saline, administer  IV, saline, heparin) method as directed. Flush IV catheter with heparin after the last saline flush. ** Discard any unused excess in syringe as directed**.  Use syringe ONE TIME only then discard. ?Storage: Room Temperature.     ? ibuprofen (ADVIL) 800 MG tablet Take by mouth.     ? Iron-Vitamin C 100-250 MG TABS iron     ? methylphenidate (RITALIN) 20 MG tablet Take 10 mg by mouth daily as needed.     ? [START ON 07/24/2021] methylphenidate (RITALIN) 20 MG tablet Take 1 tablet (20 mg total) by mouth daily. 30 tablet 0   ? methylphenidate (RITALIN) 20 MG tablet Take 1 tablet (20 mg total) by mouth daily. 30 tablet 0   ? Microlet Lancets MISC See admin instructions.     ? ondansetron (ZOFRAN) 4 MG tablet Take 1 tablet (4 mg total) by mouth every 6 (six) hours. 12 tablet 0 prn at prn  ? sodium chloride flush 0.9 % SOLN injection Inject 5 mL as directed as needed for line care. Flush using SASH (saline, administer  IV, saline, heparin) method as directed. Use syringe one time only then discard syringe. *Saline is another name for Sodium Chloride 0.9%* Storage: Room Temperature.     ? sulfamethoxazole-trimethoprim (BACTRIM DS) 800-160 MG tablet Take 1 tablet by mouth 2 (two) times daily.     ? traZODone (DESYREL) 50 MG tablet TAKE 1 TABLET BY MOUTH EVERYDAY AT BEDTIME 90 tablet 0 06/29/2021 at hs  ? triamcinolone ointment (KENALOG) 0.1 % APPLY TO AFFECTED AREA TWICE A DAY     ? ? ?Assessment: 31 year old female found to have pulmonary embolism.  ? ?H&H stable. Baseline APTT 20. No prior anticoagulation noted. ? ?Date Time HL Rate/comment ?4/30  0829 0.27  Subtherapeutic, 1200 u/hr  ?4/30 1624 0.23 Subtherapeutic, 1350 un/hr ?5/01     0015  0.29    Subtherapeutic, 1500 units/hr ?5/01 0755 0.38 Therapeutic, 1650 units/hr ?5/01 1429 0.28 Subtherapeutic, 1650  units/hr ?5/01 2200 0.26 Subtherapeutic ? ?Goal of Therapy:  ?Heparin level 0.3-0.7 units/ml ?Monitor platelets by anticoagulation protocol: Yes ?  ?Plan:  ?Insurance claims handler.  RN not aware of any stoppages or line issues. ?Give heparin bolus 1050 units and increase heparin infusion to 1950 units/hr ?Heparin level in 6 hours ?CBC daily ? ?Otelia Sergeant, PharmD, MBA ?07/03/2021 ?11:39 PM ? ?

## 2021-07-03 NOTE — Progress Notes (Signed)
*  PRELIMINARY RESULTS* ?Echocardiogram ?2D Echocardiogram has been performed. ? ?Keviana Guida, Sonia Side ?07/03/2021, 10:43 AM ?

## 2021-07-03 NOTE — Progress Notes (Signed)
PHARMACIST - PHYSICIAN COMMUNICATION ? ?CONCERNING: IV to Oral Route Change Policy ? ?RECOMMENDATION: ?This patient is receiving famotidine by the intravenous route.  Based on criteria approved by the Pharmacy and Therapeutics Committee, the intravenous medication(s) is/are being converted to the equivalent oral dose form(s). ? ? ?DESCRIPTION: ?These criteria include: ?The patient is eating (either orally or via tube) and/or has been taking other orally administered medications for a least 24 hours ?The patient has no evidence of active gastrointestinal bleeding or impaired GI absorption (gastrectomy, short bowel, patient on TNA or NPO). ? ?If you have questions about this conversion, please contact the Pharmacy Department  ? ?Benita Gutter, RPH ?07/03/2021 1:26 PM  ?

## 2021-07-03 NOTE — Progress Notes (Signed)
PHARMACY CONSULT NOTE - FOLLOW UP ? ?Pharmacy Consult for Electrolyte Monitoring and Replacement  ? ?Recent Labs: ?Potassium (mmol/L)  ?Date Value  ?07/03/2021 3.8  ? ?Magnesium (mg/dL)  ?Date Value  ?07/03/2021 2.1  ? ?Calcium (mg/dL)  ?Date Value  ?07/03/2021 8.8 (L)  ? ?Albumin (g/dL)  ?Date Value  ?07/02/2021 2.5 (L)  ? ?Phosphorus (mg/dL)  ?Date Value  ?07/02/2021 3.5  ? ?Sodium (mmol/L)  ?Date Value  ?07/03/2021 136  ? ?Assessment: ?31 year old female admitted with PE on heparin gtt.PMH includes hidradenitis suppurativa on IV ertapenem PTA, ADHD, depression. ? ?Antibiotics: vancomycin and meropenem ?Anticoagulation: heparin infusion ? ?Goal of Therapy:  ?K 4-5 ?Mag > 2 ? ?Plan:  ?Kcl 20 mEq x 1 ?Follow up electrolytes in AM ? ?Wynelle Cleveland, PharmD ?Pharmacy Resident  ?07/03/2021 ?3:16 PM ? ?

## 2021-07-04 ENCOUNTER — Other Ambulatory Visit (HOSPITAL_COMMUNITY): Payer: Self-pay

## 2021-07-04 ENCOUNTER — Inpatient Hospital Stay: Payer: BC Managed Care – PPO

## 2021-07-04 DIAGNOSIS — L732 Hidradenitis suppurativa: Secondary | ICD-10-CM | POA: Diagnosis not present

## 2021-07-04 DIAGNOSIS — A419 Sepsis, unspecified organism: Secondary | ICD-10-CM | POA: Diagnosis not present

## 2021-07-04 LAB — BASIC METABOLIC PANEL
Anion gap: 13 (ref 5–15)
BUN: 14 mg/dL (ref 6–20)
CO2: 22 mmol/L (ref 22–32)
Calcium: 8.7 mg/dL — ABNORMAL LOW (ref 8.9–10.3)
Chloride: 99 mmol/L (ref 98–111)
Creatinine, Ser: 0.75 mg/dL (ref 0.44–1.00)
GFR, Estimated: >60 mL/min (ref >60)
Glucose, Bld: 98 mg/dL (ref 70–99)
Potassium: 4 mmol/L (ref 3.5–5.1)
Sodium: 134 mmol/L — ABNORMAL LOW (ref 135–145)

## 2021-07-04 LAB — CBC
HCT: 39.8 % (ref 36.0–46.0)
Hemoglobin: 13 g/dL (ref 12.0–15.0)
MCH: 28.8 pg (ref 26.0–34.0)
MCHC: 32.7 g/dL (ref 30.0–36.0)
MCV: 88.2 fL (ref 80.0–100.0)
Platelets: 285 10*3/uL (ref 150–400)
RBC: 4.51 MIL/uL (ref 3.87–5.11)
RDW: 15 % (ref 11.5–15.5)
WBC: 15 10*3/uL — ABNORMAL HIGH (ref 4.0–10.5)
nRBC: 0 % (ref 0.0–0.2)

## 2021-07-04 LAB — HEPARIN LEVEL (UNFRACTIONATED): Heparin Unfractionated: 0.54 IU/mL (ref 0.30–0.70)

## 2021-07-04 LAB — GLUCOSE, CAPILLARY
Glucose-Capillary: 104 mg/dL — ABNORMAL HIGH (ref 70–99)
Glucose-Capillary: 95 mg/dL (ref 70–99)
Glucose-Capillary: 104 mg/dL — ABNORMAL HIGH (ref 70–99)
Glucose-Capillary: 137 mg/dL — ABNORMAL HIGH (ref 70–99)
Glucose-Capillary: 105 mg/dL — ABNORMAL HIGH (ref 70–99)

## 2021-07-04 LAB — LACTIC ACID, PLASMA: Lactic Acid, Venous: 3 mmol/L (ref 0.5–1.9)

## 2021-07-04 LAB — CATH TIP CULTURE: Culture: NO GROWTH

## 2021-07-04 LAB — PROCALCITONIN: Procalcitonin: <0.1 ng/mL

## 2021-07-04 LAB — MAGNESIUM: Magnesium: 2 mg/dL (ref 1.7–2.4)

## 2021-07-04 MED ORDER — SODIUM CHLORIDE 0.9 % IV BOLUS
500.0000 mL | Freq: Once | INTRAVENOUS | Status: AC
  Administered 2021-07-04: 500 mL via INTRAVENOUS

## 2021-07-04 MED ORDER — HEPARIN BOLUS VIA INFUSION
1050.0000 [IU] | Freq: Once | INTRAVENOUS | Status: AC
  Administered 2021-07-04: 1050 [IU] via INTRAVENOUS
  Filled 2021-07-04: qty 1050

## 2021-07-04 MED ORDER — APIXABAN 5 MG PO TABS
5.0000 mg | ORAL_TABLET | Freq: Two times a day (BID) | ORAL | Status: DC
Start: 2021-07-11 — End: 2021-07-06

## 2021-07-04 MED ORDER — APIXABAN 5 MG PO TABS
10.0000 mg | ORAL_TABLET | Freq: Two times a day (BID) | ORAL | Status: DC
  Administered 2021-07-04 – 2021-07-06 (×5): 10 mg via ORAL
  Filled 2021-07-04 (×5): qty 2

## 2021-07-04 MED ORDER — HYDROXYZINE HCL 25 MG PO TABS
25.0000 mg | ORAL_TABLET | Freq: Once | ORAL | Status: AC
  Administered 2021-07-04: 25 mg via ORAL
  Filled 2021-07-04: qty 1

## 2021-07-04 NOTE — Consult Note (Incomplete)
NAME: Courtney [name]  ?DOB: 06/11/1990  ?MRN: [id number]  ?Date/Time: 07/04/2021 2:53 PM ? ?REQUESTING PROVIDER ?Subjective:  ?REASON FOR CONSULT:  ?? ?Courtney [name] is a 31 y.o. with a history of hidradenitis suppurativa [facility] on ertapenem 1 g daily for 12 weeks since 04/12/2021  ?underwent left axilla excision on 06/26/21 by Dr. Kathi LudwigSyed ?She is also been getting infliximab infusions the last one was 06/05/2021. ?[facility]21 with altered mental status.  Patient apparently was feeling very weak the whole day.  She had taken multiple THC edibles.  Vitals 96/58, pulse rate 105, respiratory 25 and temperature 97.9.  Sats 95%.  She was drowsy on presentation but arousable. ?Labs revealed a lactate of more than 9, CO2 18, calcium 7.6 AST 13 alkaline phosphatase 37, WBC 18.3, Hb 8.1, platelet 207, creatinine 0.74, total protein 6.3, mag 1.6.  Procalcitonin was less than 0.1.  The PICC site had some redness.  CT chest showed ?Lobar/segmental bilateral lower lobe pulmonary emboli the clot burden was small to moderate but no evidence of right heart strain.  CT abdomen was normal.  Blood gases revealed a PO2 of 60 and PCO2 of 39 and pH of 7.39.  This was a venous sample.  Blood culture was sent and the PICC line was removed.  She was placed on IV meropenem and vancomycin. ? ? ? ? ? ? ?Animal bites ?Tick exposure ?Water sports ?Fishing/hunting/animal bird exposure ?Past Medical History:  ?Diagnosis Date  ? ADHD (attention deficit hyperactivity disorder)   ? Bipolar disorder (HCC)   ? Chronic tonsillitis 08/2013  ? current strep, will finish antibiotic 09/04/2013; snores during sleep, mother denies apnea  ? Family history of adverse reaction to anesthesia   ? " My cousin had problems waking up. "  ? Heart murmur   ? "when I was born; outgrew it; I was a preemie"  ? Hidradenitis   ? Hidradenitis 2017  ? Migraine 12/19/2016  ? "I've just had this one for 3 days" (12/20/2016)  ?  ?Past Surgical History:   ?Procedure Laterality Date  ? AXILLARY HIDRADENITIS EXCISION    ? AXILLARY LYMPH NODE BIOPSY Left 01/06/2019  ? CYSTOSCOPY W/ URETERAL STENT PLACEMENT Right 05/28/2017  ? Procedure: CYSTOSCOPY WITH RETROGRADE PYELOGRAM/ RIGHT URETERAL STENT PLACEMENT;  Surgeon: Crist FatHerrick, Benjamin W, MD;  Location: [facility];  Service: Urology;  Laterality: Right;  ? HYDRADENITIS EXCISION Bilateral 04/03/2017  ? Procedure: EXCISION AND DRAINAGE BILATERAL  HIDRADENITIS AXILLA;  Surgeon: Griselda Mineroth, Paul III, MD;  [facility];  Service: General;  Laterality: Bilateral;  ? INCISION AND DRAINAGE ABSCESS Right 07/20/2016  ? Procedure: INCISION AND DRAINAGE ABSCESS right axilla;  Surgeon: Abigail MiyamotoBlackman, Douglas, MD;  [facility];  Service: General;  Laterality: Right;  ? INTRAUTERINE DEVICE (IUD) INSERTION  11/2016  ? "had the one in my left arm removed"  ? IRRIGATION AND DEBRIDEMENT ABSCESS Right 12/21/2016  ? Procedure: IRRIGATION AND DEBRIDEMENT RIGHT AXILLARY HIDRADENITIS;  Surgeon: Andria MeuseWhite, Christopher M, MD;  Location: [facility] OR;  Service: General;  Laterality: Right;  ? IRRIGATION AND DEBRIDEMENT ABSCESS Left 01/08/2017  ? Procedure: IRRIGATION AND DEBRIDEMENT AXILLARY ABSCESS;  Surgeon: Griselda Mineroth, Paul III, MD;  [facility];  Service: General;  Laterality: Left;  ? NASAL SEPTOPLASTY W/ TURBINOPLASTY Bilateral 02/25/2019  ? Procedure: NASAL SEPTOPLASTY WITH TURBINATE REDUCTION;  Surgeon: Serena Colonelosen, Jefry, MD;  Location: [facility];  Service: ENT;  Laterality: Bilateral;  ? TONSILLECTOMY Bilateral 09/14/2013  ? Procedure:  BILATERAL TONSILLECTOMY;  Surgeon: Serena Colonel, MD;  Location: [facility];  Service: ENT;  Laterality: Bilateral;  ? TYMPANOPLASTY Bilateral   ? "rebuilt eardrums"  ? WISDOM TOOTH EXTRACTION    ?  ?Social History  ? ?Socioeconomic History  ? Marital status: Single  ?  Spouse name: Not on file  ? Number of children: Not on file  ? Years of education: EMS/Fire training  ? Highest education level: Not on file   ?Occupational History  ? Not on file  ?Tobacco Use  ? Smoking status: Never  ? Smokeless tobacco: Never  ?Vaping Use  ? Vaping Use: Former  ?Substance and Sexual Activity  ? Alcohol use: Not Currently  ? Drug use: Yes  ?  Types: Marijuana  ?  Comment: in high school  ? Sexual activity: Yes  ?  Birth control/protection: Pill, Condom  ?Other Topics Concern  ? Not on file  ?Social History Narrative  ? 02/11/19  ? From: the area  ? Living: with Mom currently, working to find her own place  ? Work: EMS/Fire  ?   ? Family: good relationship with Mom and Grandma  ?   ? Enjoys: spend time with friends/family, hunting, bomb fire  ?   ? Exercise: every shift does training - 2-3 times a week  ? Diet: well rounded, meat heavy  ?   ? Safety  ? Seat belts: Yes   ? Guns: Yes  and secure  ? Safe in relationships: Yes   ? Helmet: no   ? ?Social Determinants of Health  ? ?Financial Resource Strain: Not on file  ?Food Insecurity: Not on file  ?Transportation Needs: Not on file  ?Physical Activity: Not on file  ?Stress: Not on file  ?Social Connections: Not on file  ?Intimate Partner Violence: Not on file  ?  ?Family History  ?Problem Relation Age of Onset  ? Hypertension Mother   ? Hyperlipidemia Mother   ? Diabetes Mother   ? Heart attack Father 75  ? Parkinson's disease Maternal Grandmother   ? Dementia Maternal Grandmother   ? Diabetes Maternal Grandmother   ? Bell's palsy Maternal Grandmother   ? Alcohol abuse Paternal Uncle   ? ?Allergies  ?Allergen Reactions  ? Other Hives  ?  ABD pad causes rash ?Pt can only tolerate cloth tape ?Pt can only tolerate cloth tape  ? Shellfish Allergy Hives  ? Tapentadol Hives and Other (See Comments)  ?  Pt can only tolerate cloth tape  ? Tape Hives and Other (See Comments)  ?  Pt can only tolerate cloth tape  ? Ginger Hives  ? Morphine And Related   ?  " I become very angry"  ? Vancomycin Other (See Comments)  ?  Burns her vein really bad and always causes them to blow.  ? ?I? ?Current  Facility-Administered Medications  ?Medication Dose Route Frequency Provider Last Rate Last Admin  ? acetaminophen (TYLENOL) tablet 650 mg  650 mg Oral Q6H PRN Cipriano Bunker, MD   650 mg at 07/03/21 1422  ? apixaban (ELIQUIS) tablet 10 mg  10 mg Oral BID Cipriano Bunker, MD   10 mg at 07/04/21 1154  ? Followed by  ? [START ON 07/11/2021] apixaban (ELIQUIS) tablet 5 mg  5 mg Oral BID Cipriano Bunker, MD      ? ARIPiprazole (ABILIFY) tablet 2 mg  2 mg Oral Daily Cipriano Bunker, MD   2 mg at 07/04/21 0926  ? busPIRone (BUSPAR)  tablet 10 mg  10 mg Oral BID Cipriano Bunker, MD   10 mg at 07/04/21 0086  ? Chlorhexidine Gluconate Cloth 2 % PADS 6 each  6 each Topical Daily Erin Fulling, MD   6 each at 07/04/21 0926  ? diphenhydrAMINE (BENADRYL) capsule 25 mg  25 mg Oral Q6H PRN Cipriano Bunker, MD   25 mg at 07/03/21 1648  ? docusate sodium (COLACE) capsule 100 mg  100 mg Oral BID PRN Rust-Chester, Micheline Rough L, NP      ? famotidine (PEPCID) tablet 20 mg  20 mg Oral BID Tressie Ellis, RPH   20 mg at 07/04/21 7619  ? lamoTRIgine (LAMICTAL) tablet 50 mg  50 mg Oral BID Cipriano Bunker, MD   50 mg at 07/04/21 5093  ? meropenem (MERREM) 1 g in sodium chloride 0.9 % 100 mL IVPB  1 g Intravenous Q8H Erin Fulling, MD   Stopped at 07/04/21 0543  ? polyethylene glycol (MIRALAX / GLYCOLAX) packet 17 g  17 g Oral Daily PRN Rust-Chester, Britton L, NP      ? traZODone (DESYREL) tablet 50 mg  50 mg Oral QHS Cipriano Bunker, MD   50 mg at 07/03/21 2123  ? vancomycin (VANCOREADY) IVPB 750 mg/150 mL  750 mg Intravenous Q12H Erin Fulling, MD   Stopped at 07/04/21 1354  ?  ? ?Abtx:  ?Anti-infectives (From admission, onward)  ? ? Start     Dose/Rate Route Frequency Ordered Stop  ? 07/03/21 0000  vancomycin (VANCOREADY) IVPB 750 mg/150 mL       ? 750 mg ?75 mL/hr over 120 Minutes Intravenous Every 12 hours 07/02/21 2302    ? 07/02/21 1100  vancomycin (VANCOREADY) IVPB 750 mg/150 mL  Status:  Discontinued       ? 750 mg ?150 mL/hr over 60 Minutes  Intravenous Every 12 hours 07/02/21 0422 07/02/21 0423  ? 07/02/21 1100  vancomycin (VANCOREADY) IVPB 750 mg/150 mL  Status:  Discontinued       ? 750 mg ?150 mL/hr over 60 Minutes Intravenous Every 12 hours 07/02/21 0424

## 2021-07-04 NOTE — Progress Notes (Signed)
PHARMACY CONSULT NOTE - FOLLOW UP ? ?Pharmacy Consult for Electrolyte Monitoring and Replacement  ? ?Recent Labs: ?Potassium (mmol/L)  ?Date Value  ?07/04/2021 4.0  ? ?Magnesium (mg/dL)  ?Date Value  ?07/04/2021 2.0  ? ?Calcium (mg/dL)  ?Date Value  ?07/04/2021 8.7 (L)  ? ?Albumin (g/dL)  ?Date Value  ?07/02/2021 2.5 (L)  ? ?Phosphorus (mg/dL)  ?Date Value  ?07/02/2021 3.5  ? ?Sodium (mmol/L)  ?Date Value  ?07/04/2021 134 (L)  ? ?Assessment: ?31 year old female admitted with PE on heparin gtt.PMH includes hidradenitis suppurativa on IV ertapenem PTA, ADHD, depression. ? ?Antibiotics: vancomycin and meropenem ?Anticoagulation: heparin infusion ?I&O + 1.89 L ? ?Goal of Therapy:  ?K 4-5 ?Mag > 2 ?All other electrolytes within normal limits ? ?Plan:  ?No replacement warranted today, 5/2. ?Sodium slightly below goal. Continue to monitor. ?Follow up labs in AM ? ? ?Jaynie Bream, PharmD ?Pharmacy Resident  ?07/04/2021 ?8:35 AM ? ?

## 2021-07-04 NOTE — Progress Notes (Signed)
ANTICOAGULATION CONSULT NOTE  ? ?Pharmacy Consult for Heparin  ?Indication: pulmonary embolus ? ?Allergies  ?Allergen Reactions  ? Other Hives  ?  ABD pad causes rash ?Pt can only tolerate cloth tape ?Pt can only tolerate cloth tape  ? Shellfish Allergy Hives  ? Tapentadol Hives and Other (See Comments)  ?  Pt can only tolerate cloth tape  ? Tape Hives and Other (See Comments)  ?  Pt can only tolerate cloth tape  ? Ginger Hives  ? Morphine And Related   ?  " I become very angry"  ? Vancomycin Other (See Comments)  ?  Burns her vein really bad and always causes them to blow.  ? ? ?Patient Measurements: ?Height: 5\' 1"  (154.9 cm) ?Weight: 90.7 kg (199 lb 15.3 oz) ?IBW/kg (Calculated) : 47.8 ?Heparin Dosing Weight: 70 kg  ? ?Vital Signs: ?Temp: 98.5 ?F (36.9 ?C) (05/02 0400) ?Temp Source: Oral (05/02 0400) ?BP: 102/66 (05/02 0600) ?Pulse Rate: 86 (05/02 0600) ? ?Labs: ?Recent Labs  ?  07/01/21 ?2247 07/02/21 ?0157 07/02/21 ?07/04/21 07/02/21 ?1624 07/03/21 ?0015 07/03/21 ?09/02/21 07/03/21 ?1429 07/03/21 ?2200 07/04/21 ?0341 07/04/21 ?0735  ?HGB 8.1*  --  11.3*  --  11.5*  --   --   --  13.0  --   ?HCT 26.9*  --  34.5*  --  35.3*  --   --   --  39.8  --   ?PLT 207  --  256  --  257  --   --   --  285  --   ?APTT 20*  --   --   --   --   --   --   --   --   --   ?LABPROT 14.5  --   --   --   --   --   --   --   --   --   ?INR 1.1  --   --   --   --   --   --   --   --   --   ?HEPARINUNFRC  --   --  0.27*   < > 0.29* 0.38 0.28* 0.26*  --  0.54  ?CREATININE 0.61   < > 0.64  --  0.72 0.66  --   --  0.75  --   ? < > = values in this interval not displayed.  ? ? ? ?Estimated Creatinine Clearance: 107.4 mL/min (by C-G formula based on SCr of 0.75 mg/dL). ? ? ?Medical History: ?Past Medical History:  ?Diagnosis Date  ? ADHD (attention deficit hyperactivity disorder)   ? Bipolar disorder (HCC)   ? Chronic tonsillitis 08/2013  ? current strep, will finish antibiotic 09/04/2013; snores during sleep, mother denies apnea  ? Family history of  adverse reaction to anesthesia   ? " My cousin had problems waking up. "  ? Heart murmur   ? "when I was born; outgrew it; I was a preemie"  ? Hidradenitis   ? Hidradenitis 2017  ? Migraine 12/19/2016  ? "I've just had this one for 3 days" (12/20/2016)  ? ? ?Medications:  ?Medications Prior to Admission  ?Medication Sig Dispense Refill Last Dose  ? ARIPiprazole (ABILIFY) 2 MG tablet Take 1 tablet (2 mg total) by mouth daily. 90 tablet 1 07/01/2021 at am  ? HYDROcodone-acetaminophen (NORCO/VICODIN) 5-325 MG tablet Take 1 to 2 tablets by mouth every 6 hours as needed for pain 15 tablet 0 07/01/2021 at unknown  ?  hydrOXYzine (ATARAX) 25 MG tablet Take 1 tablet (25 mg total) by mouth every 8 (eight) hours as needed for itching. 90 tablet 1 07/01/2021 at unknown  ? lamoTRIgine (LAMICTAL) 100 MG tablet Take 0.5 tablets (50 mg total) by mouth 2 (two) times daily. 90 tablet 1 07/01/2021 at am  ? methylphenidate (RITALIN) 20 MG tablet Take 1 tablet (20 mg total) by mouth daily. 30 tablet 0 07/01/2021 at am  ? montelukast (SINGULAIR) 10 MG tablet TAKE 1 TABLET BY MOUTH EVERYDAY AT BEDTIME 90 tablet 0 06/29/2021 at pm  ? predniSONE (DELTASONE) 20 MG tablet Take 20 mg by mouth daily.   07/01/2021 at am  ? busPIRone (BUSPAR) 10 MG tablet Take 1 tablet (10 mg total) by mouth 2 (two) times daily. 60 tablet 1   ? clindamycin (CLEOCIN) 300 MG capsule Take 300 mg by mouth 2 (two) times daily.     ? diazepam (VALIUM) 10 MG tablet Take 1 tablet 30 minutes prior to dermatology procedure   06/26/2021 at for procedure  ? diclofenac (VOLTAREN) 75 MG EC tablet Take by mouth.     ? diphenhydrAMINE (BENADRYL) 25 mg capsule Take by mouth.     ? drospirenone-ethinyl estradiol (YAZ) 3-0.02 MG tablet    06/24/2021 at am  ? EPINEPHrine (EPIPEN 2-PAK) 0.3 mg/0.3 mL IJ SOAJ injection Inject 0.3 mg into the muscle as needed for anaphylaxis. 1 each 0 prn at prn  ? ERTAPENEM SODIUM IV Inject into the vein.     ? fluconazole (DIFLUCAN) 150 MG tablet Take 150 mg  by mouth once.     ? glucose blood (CONTOUR TEST) test strip And lancets #100 100 each 12   ? heparin lock flush 100 UNIT/ML SOLN injection Inject 2 mL (200 Units total) as directed as needed for line care. Flush using SASH (saline, administer  IV, saline, heparin) method as directed. Flush IV catheter with heparin after the last saline flush. ** Discard any unused excess in syringe as directed**.  Use syringe ONE TIME only then discard. ?Storage: Room Temperature.     ? ibuprofen (ADVIL) 800 MG tablet Take by mouth.     ? Iron-Vitamin C 100-250 MG TABS iron     ? methylphenidate (RITALIN) 20 MG tablet Take 10 mg by mouth daily as needed.     ? [START ON 07/24/2021] methylphenidate (RITALIN) 20 MG tablet Take 1 tablet (20 mg total) by mouth daily. 30 tablet 0   ? methylphenidate (RITALIN) 20 MG tablet Take 1 tablet (20 mg total) by mouth daily. 30 tablet 0   ? Microlet Lancets MISC See admin instructions.     ? ondansetron (ZOFRAN) 4 MG tablet Take 1 tablet (4 mg total) by mouth every 6 (six) hours. 12 tablet 0 prn at prn  ? sodium chloride flush 0.9 % SOLN injection Inject 5 mL as directed as needed for line care. Flush using SASH (saline, administer  IV, saline, heparin) method as directed. Use syringe one time only then discard syringe. *Saline is another name for Sodium Chloride 0.9%* Storage: Room Temperature.     ? sulfamethoxazole-trimethoprim (BACTRIM DS) 800-160 MG tablet Take 1 tablet by mouth 2 (two) times daily.     ? traZODone (DESYREL) 50 MG tablet TAKE 1 TABLET BY MOUTH EVERYDAY AT BEDTIME 90 tablet 0 06/29/2021 at hs  ? triamcinolone ointment (KENALOG) 0.1 % APPLY TO AFFECTED AREA TWICE A DAY     ? ? ?Assessment: 31 year old female found to have pulmonary  embolism.  ? ?H&H stable. Baseline APTT 20. No prior anticoagulation noted. ? ?Date Time HL Rate/comment ?4/30  0829 0.27  Subtherapeutic, 1200 u/hr  ?4/30 1624 0.23 Subtherapeutic, 1350 un/hr ?5/01     0015    0.29    Subtherapeutic, 1500  units/hr ?5/01 0755 0.38 Therapeutic, 1650 units/hr ?5/01 1429 0.28 Subtherapeutic, 1800 units/hr ?5/01 2200 0.26 Subtherapeutic, 1950 units/hr ?5/01 0735 0.54 Therapeutic, 1950 units/hr ? ?Goal of Therapy:  ?Heparin level 0.3-0.7 units/ml ?Monitor platelets by anticoagulation protocol: Yes ?  ?Plan: Heparin level therapeutic x 1 ?Continue heparin 1950 units/hr ?Heparin level in 6 hours ?CBC daily ? ?Jaynie Breamaroline A Bekka Qian, PharmD ?Pharmacy Resident  ?07/04/2021 ?8:28 AM ?

## 2021-07-04 NOTE — Progress Notes (Signed)
?PROGRESS NOTE ? ? ? ?Courtney ALOI  RJJ:[id number] DOB: August 18, 1990 DOA: 07/01/2021 ? ?PCP: Lynnda Child, MD  ? ? ?Brief Narrative: ?This 31 years old female presented in the ED with complaints of altered mentation drowsiness and possible syncope.  History is obtained from mother who reports patient has been self administering ertapenem IV through PICC line due to infected ulcers on her left thigh and right breast from hidradenitis.  She was found more lethargic than usual and told her mom that she is not feeling well.  EMS was called.  Afterwards mother has found high-dose THC Gummies with packaging showing that each gummy is 350 mg.  Mom is unsure about any other recreational drug use habits and has been suspicious that patient might smoke cigarettes and is unsure.  Patient was lethargic on arrival but protecting her airway.  Sepsis protocol was initiated.  Labs showed leukocytosis, lactic acidosis more than 9.  ?CTA chest shows lobar segmental bilateral lower lobe pulmonary emboli.  Overall clot burden is small to moderate no evidence of right heart strain.  CT abdomen pelvis negative for any acute abnormality. ?Patient was initially admitted under ICU for severe sepsis and altered mental status, at high risk for intubation.  Patient has improved.  Started on heparin gtt, and IV antibiotics.  Infectious disease consulted. ?PCCM pickup 07/03/2021 ? ?Assessment & Plan: ?  ?Principal Problem: ?  Sepsis (HCC) ? ?Severe sepsis without septic shock: ?Skin infection in the setting of chronic outpatient PICC placement and hidradenitis suppuratica. ?Presented with leukocytosis, lactic acidosis, skin infections, tachycardia, tachypnea. ?Lactic acidosis improving with IV hydration. Lactic acid 9.1> 2.1> 3.0 ?Initiated on empiric antibiotics ( meropenem and vancomycin) ?Continue supplemental oxygen and wean as tolerated. ?Continue IV hydration RL at 100 cc. ?Sepsis physiology improving. ?Follow-up cultures.  Procalcitonin  0.10 ?C. difficile antigen positive toxin PCR negative. ?Infectious disease consulted for antibiotics recommendation. ? ?Bilateral lower lobe pulmonary embolism in the setting of birth control pills. ?Continue heparin gtt. as per pharmacy protocol. ?Continue supplemental oxygen admitted saturation above 90% ?Lower extremity DVT negative for thrombosis. ?Consider hypercoagulable panel (factor V Leyden, lupus anticoagulant, homocystine, Antithrombin III, protein C and protein S) ?2D echocardiogram shows LVEF 60 to 65%. ?Vascular surgery consulted to determine need for IVC filter, since patient has no DVT does not require IVC filter. ? ?Acute encephalopathy  > Resolved. ?suspected ingestion of high concentration THC Gummies. ?Continue supportive care. ?There was a concern for deterioration,  risk of intubation ?Avoid sedating medications. ?Acute encephalopathy resolved. ? ? ?DVT prophylaxis: Heparin gtt ?Code Status: Full code. ?Family Communication:  Mother at bed side. ?Disposition Plan:  ?Status is: Inpatient ?Remains inpatient appropriate because:  ?Admitted for bilateral PE requiring IV heparin, also found to have suspected sepsis requiring IV antibiotics in the setting of PICC line. ?ID consulted for further recommendation. ?  ? ?Consultants:  ?Vascular surgery ? ?Procedures: CTA chest, CT abdomen pelvis, echocardiogram ?Antimicrobials: ?Vancomycin, meropenum ? ?Subjective: ?Patient was seen and examined at bedside.  Overnight events noted.   ?Patient reports feeling better.  She is alert and oriented x3, following full commands. ? ? ?Objective: ?Vitals:  ? 07/04/21 0500 07/04/21 0600 07/04/21 0800 07/04/21 0900  ?BP: 98/84 102/66  108/72  ?Pulse: 84 86  (!) 110  ?Resp: (!) 21 (!) 23  (!) 22  ?Temp:   98.6 ?F (37 ?C)   ?TempSrc:   Oral   ?SpO2: 97% 96%  97%  ?Weight: 90.7 kg     ?  Height:      ? ? ?Intake/Output Summary (Last 24 hours) at 07/04/2021 1109 ?Last data filed at 07/03/2021 1111 ?Gross per 24 hour  ?Intake  --  ?Output 750 ml  ?Net -750 ml  ? ?Filed Weights  ? 07/02/21 1415 07/03/21 0500 07/04/21 0500  ?Weight: 94.7 kg 93.2 kg 90.7 kg  ? ? ?Examination: ? ?General exam: Appears comfortable, not in any acute distress. ?Respiratory system: CTA bilaterally, no wheezing, no crackles, normal respiratory effort. ?Cardiovascular system: S1 & S2 heard, regular rate and rhythm, no murmur. ?Gastrointestinal system: Abdomen is soft, non tender, non distended, BS+ ?Central nervous system: Alert and oriented x 3 . No focal neurological deficits. ?Extremities: no Edema, no cyanosis, no clubbing ?Skin: Ulceration in the left upper thigh,  under right breast , left axilla, PICC line noted in the right arm ?Psychiatry: Judgement and insight appear normal. Mood & affect appropriate.  ? ? ? ?Data Reviewed: I have personally reviewed following labs and imaging studies ? ?CBC: ?Recent Labs  ?Lab 07/01/21 ?2247 07/02/21 ?6962 07/03/21 ?0015 07/04/21 ?0341  ?WBC 18.3* 19.5* 14.2* 15.0*  ?NEUTROABS 13.3*  --   --   --   ?HGB 8.1* 11.3* 11.5* 13.0  ?HCT 26.9* 34.5* 35.3* 39.8  ?MCV 95.4 89.4 89.4 88.2  ?PLT 207 256 257 285  ? ?Basic Metabolic Panel: ?Recent Labs  ?Lab 07/01/21 ?2247 07/02/21 ?0157 07/02/21 ?9528 07/03/21 ?0015 07/03/21 ?4132 07/04/21 ?0341  ?NA 135 136 137  --  136 134*  ?K 4.2 4.1 4.1  --  3.8 4.0  ?CL 106 104 104  --  101 99  ?CO2 18* 24 24  --  26 22  ?GLUCOSE 76 90 84  --  82 98  ?BUN 11 13 11   --  10 14  ?CREATININE 0.61 0.74 0.64 0.72 0.66 0.75  ?CALCIUM 7.6* 8.5* 8.8*  --  8.8* 8.7*  ?MG  --  1.6*  --  2.1  --  2.0  ?PHOS  --  3.5  --   --   --   --   ? ?GFR: ?Estimated Creatinine Clearance: 107.4 mL/min (by C-G formula based on SCr of 0.75 mg/dL). ?Liver Function Tests: ?Recent Labs  ?Lab 07/01/21 ?2247 07/02/21 ?0157  ?AST 13* 18  ?ALT 11 13  ?ALKPHOS 37* 54  ?BILITOT 0.2* 0.4  ?PROT 4.9* 6.3*  ?ALBUMIN 2.0* 2.5*  ? ?No results for input(s): LIPASE, AMYLASE in the last 168 hours. ?No results for input(s): AMMONIA in  the last 168 hours. ?Coagulation Profile: ?Recent Labs  ?Lab 07/01/21 ?2247  ?INR 1.1  ? ?Cardiac Enzymes: ?No results for input(s): CKTOTAL, CKMB, CKMBINDEX, TROPONINI in the last 168 hours. ?BNP (last 3 results) ?No results for input(s): PROBNP in the last 8760 hours. ?HbA1C: ?No results for input(s): HGBA1C in the last 72 hours. ?CBG: ?Recent Labs  ?Lab 07/03/21 ?1631 07/03/21 ?2118 07/03/21 ?2335 07/04/21 ?09/03/21 07/04/21 ?09/03/21  ?GLUCAP 100* 100* 111* 104* 95  ? ?Lipid Profile: ?No results for input(s): CHOL, HDL, LDLCALC, TRIG, CHOLHDL, LDLDIRECT in the last 72 hours. ?Thyroid Function Tests: ?No results for input(s): TSH, T4TOTAL, FREET4, T3FREE, THYROIDAB in the last 72 hours. ?Anemia Panel: ?No results for input(s): VITAMINB12, FOLATE, FERRITIN, TIBC, IRON, RETICCTPCT in the last 72 hours. ?Sepsis Labs: ?Recent Labs  ?Lab 07/01/21 ?2247 07/02/21 ?0157 07/02/21 ?1153 07/03/21 ?0015 07/04/21 ?0341 07/04/21 ?1017  ?PROCALCITON  --  <0.10  --  <0.10 <0.10  --   ?LATICACIDVEN >9.0* 3.0* 2.1*  --   --  3.0*  ? ? ?Recent Results (from the past 240 hour(s))  ?Resp Panel by RT-PCR (Flu A&B, Covid) Nasopharyngeal Swab     Status: None  ? Collection Time: 07/01/21 10:46 PM  ? Specimen: Nasopharyngeal Swab; Nasopharyngeal(NP) swabs in vial transport medium  ?Result Value Ref Range Status  ? SARS Coronavirus 2 by RT PCR NEGATIVE NEGATIVE Final  ?  Comment: (NOTE) ?SARS-CoV-2 target nucleic acids are NOT DETECTED. ? ?The SARS-CoV-2 RNA is generally detectable in upper respiratory ?specimens during the acute phase of infection. The lowest ?concentration of SARS-CoV-2 viral copies this assay can detect is ?138 copies/mL. A negative result does not preclude SARS-Cov-2 ?infection and should not be used as the sole basis for treatment or ?other patient management decisions. A negative result may occur with  ?improper specimen collection/handling, submission of specimen other ?than nasopharyngeal swab, presence of viral  mutation(s) within the ?areas targeted by this assay, and inadequate number of viral ?copies(<138 copies/mL). A negative result must be combined with ?clinical observations, patient history, and epidemiological

## 2021-07-04 NOTE — TOC Benefit Eligibility Note (Signed)
Patient Advocate Encounter ? ?Insurance verification completed.   ? ?The patient is currently admitted and upon discharge could be taking Eliquis 48m tablets. ? ?The current 30 day co-pay is, $417.52 because of a $450 deductible. Once deductible is met co-pay will be $40. ? ?The patient is insured through [employer]? ? ? ?CLady Deutscher CPhT-Adv ?Pharmacy Patient Advocate Specialist ?[facility] Advocate Team ?Direct Number: [phone] Fax: [phone] ? ? ? ? ? ?

## 2021-07-04 NOTE — Progress Notes (Signed)
Pharmacy Antibiotic Note ? ?Courtney [name] is a 31 y.o. female admitted on 07/01/2021 with sepsis and pulmonary embolism. Concern for CLABSI vs skin infection. Patient has history of hidradenitis supprativa, as well as chronic outpatient PICC line. Pharmacy has been consulted for vancomycin and meropenem dosing.   ? ?Pt reported history of allergy to vancomycin (burns veins).  Will double infusion time to avoid this reaction.  ? ?Per MD note, ID consulted as of 5/2 ? ?Plan: ? ?Meropenem 1 g IV q8h ? ?Vancomycin 750 mg IV q12h ?Goal AUC 400-600  ?Scr 0.8, IBW for CrCl/Ke, Vd 0.5 L/kg ?Estimated AUC 460, Cmin 13.1 ?Levels ordered around dose tonight ? ?Height: 5\' 1"  (154.9 cm) ?Weight: 90.7 kg (199 lb 15.3 oz) ?IBW/kg (Calculated) : 47.8 ? ?Temp (24hrs), Avg:98.4 ?F (36.9 ?C), Min:97.9 ?F (36.6 ?C), Max:98.6 ?F (37 ?C) ? ?Recent Labs  ?Lab 07/01/21 ?2247 07/02/21 ?0157 07/02/21 ?[id number] 07/02/21 ?1153 07/03/21 ?0015 07/03/21 ?[id number] 07/04/21 ?[id number] 07/04/21 ?1017  ?WBC 18.3*  --  19.5*  --  14.2*  --  15.0*  --   ?CREATININE 0.61 0.74 0.64  --  0.72 0.66 0.75  --   ?LATICACIDVEN >9.0* 3.0*  --  2.1*  --   --   --  3.0*  ? ?  ?Estimated Creatinine Clearance: 107.4 mL/min (by C-G formula based on SCr of 0.75 mg/dL).   ? ?Allergies  ?Allergen Reactions  ? Other Hives  ?  ABD pad causes rash ?Pt can only tolerate cloth tape ?Pt can only tolerate cloth tape  ? Shellfish Allergy Hives  ? Tapentadol Hives and Other (See Comments)  ?  Pt can only tolerate cloth tape  ? Tape Hives and Other (See Comments)  ?  Pt can only tolerate cloth tape  ? Ginger Hives  ? Morphine And Related   ?  " I become very angry"  ? Vancomycin Other (See Comments)  ?  Burns her vein really bad and always causes them to blow.  ? ? ?Antimicrobials this admission: ?4/29 Vancomycin >>  ?4/30 Meropenem  >>  ? ?Dose adjustments this admission: ?N/A ? ?Microbiology results: ?BCx: NGTD ?UCx: NG ?Cath tip culture: NG ?MRSA PCR: (-) ?C diff screen: Ag (+), toxin  (-), PCR (-) c/w colonization ? ?Thank you for allowing pharmacy to be a part of this patient?s care. ? ?Benita Gutter ?07/04/2021 ?3:06 PM ?

## 2021-07-05 DIAGNOSIS — I2699 Other pulmonary embolism without acute cor pulmonale: Secondary | ICD-10-CM | POA: Diagnosis not present

## 2021-07-05 DIAGNOSIS — R6510 Systemic inflammatory response syndrome (SIRS) of non-infectious origin without acute organ dysfunction: Secondary | ICD-10-CM

## 2021-07-05 DIAGNOSIS — A5901 Trichomonal vulvovaginitis: Secondary | ICD-10-CM

## 2021-07-05 DIAGNOSIS — G9341 Metabolic encephalopathy: Secondary | ICD-10-CM

## 2021-07-05 DIAGNOSIS — L732 Hidradenitis suppurativa: Secondary | ICD-10-CM | POA: Diagnosis not present

## 2021-07-05 DIAGNOSIS — A419 Sepsis, unspecified organism: Secondary | ICD-10-CM | POA: Diagnosis not present

## 2021-07-05 LAB — COMPREHENSIVE METABOLIC PANEL
ALT: 16 U/L (ref 0–44)
AST: 22 U/L (ref 15–41)
Albumin: 3.2 g/dL — ABNORMAL LOW (ref 3.5–5.0)
Alkaline Phosphatase: 67 U/L (ref 38–126)
Anion gap: 9 (ref 5–15)
BUN: 12 mg/dL (ref 6–20)
CO2: 23 mmol/L (ref 22–32)
Calcium: 9.4 mg/dL (ref 8.9–10.3)
Chloride: 103 mmol/L (ref 98–111)
Creatinine, Ser: 0.76 mg/dL (ref 0.44–1.00)
GFR, Estimated: >60 mL/min (ref >60)
Glucose, Bld: 100 mg/dL — ABNORMAL HIGH (ref 70–99)
Potassium: 4.3 mmol/L (ref 3.5–5.1)
Sodium: 135 mmol/L (ref 135–145)
Total Bilirubin: 0.7 mg/dL (ref 0.3–1.2)
Total Protein: 8.2 g/dL — ABNORMAL HIGH (ref 6.5–8.1)

## 2021-07-05 LAB — CBC
HCT: 41.7 % (ref 36.0–46.0)
Hemoglobin: 13.6 g/dL (ref 12.0–15.0)
MCH: 28.5 pg (ref 26.0–34.0)
MCHC: 32.6 g/dL (ref 30.0–36.0)
MCV: 87.2 fL (ref 80.0–100.0)
Platelets: 311 10*3/uL (ref 150–400)
RBC: 4.78 MIL/uL (ref 3.87–5.11)
RDW: 14.9 % (ref 11.5–15.5)
WBC: 14.7 10*3/uL — ABNORMAL HIGH (ref 4.0–10.5)
nRBC: 0 % (ref 0.0–0.2)

## 2021-07-05 LAB — VANCOMYCIN, PEAK: Vancomycin Pk: 18 ug/mL — ABNORMAL LOW (ref 30–40)

## 2021-07-05 LAB — WET PREP, GENITAL
Clue Cells Wet Prep HPF POC: NONE SEEN
Sperm: NONE SEEN
WBC, Wet Prep HPF POC: >=10 — AB (ref <10)
Yeast Wet Prep HPF POC: NONE SEEN

## 2021-07-05 LAB — GLUCOSE, CAPILLARY
Glucose-Capillary: 146 mg/dL — ABNORMAL HIGH (ref 70–99)
Glucose-Capillary: 144 mg/dL — ABNORMAL HIGH (ref 70–99)
Glucose-Capillary: 115 mg/dL — ABNORMAL HIGH (ref 70–99)

## 2021-07-05 LAB — PHOSPHORUS: Phosphorus: 3.6 mg/dL (ref 2.5–4.6)

## 2021-07-05 LAB — MAGNESIUM: Magnesium: 2.1 mg/dL (ref 1.7–2.4)

## 2021-07-05 LAB — LACTIC ACID, PLASMA: Lactic Acid, Venous: 2.9 mmol/L (ref 0.5–1.9)

## 2021-07-05 LAB — VANCOMYCIN, TROUGH: Vancomycin Tr: 7 ug/mL — ABNORMAL LOW (ref 15–20)

## 2021-07-05 MED ORDER — SODIUM CHLORIDE 0.9 % IV SOLN: INTRAVENOUS | Status: DC

## 2021-07-05 MED ORDER — AMOXICILLIN-POT CLAVULANATE 875-125 MG PO TABS
1.0000 | ORAL_TABLET | Freq: Two times a day (BID) | ORAL | Status: DC
  Administered 2021-07-05: 1 via ORAL
  Filled 2021-07-05: qty 1

## 2021-07-05 MED ORDER — VANCOMYCIN HCL 1250 MG/250ML IV SOLN: 1250.0000 mg | Freq: Two times a day (BID) | INTRAVENOUS | Status: DC

## 2021-07-05 MED ORDER — METRONIDAZOLE 500 MG PO TABS
500.0000 mg | ORAL_TABLET | Freq: Two times a day (BID) | ORAL | Status: DC
  Administered 2021-07-05: 500 mg via ORAL
  Filled 2021-07-05: qty 1

## 2021-07-05 MED ORDER — METRONIDAZOLE 500 MG PO TABS
500.0000 mg | ORAL_TABLET | Freq: Two times a day (BID) | ORAL | Status: DC
  Administered 2021-07-05 – 2021-07-06 (×2): 500 mg via ORAL
  Filled 2021-07-05 (×2): qty 1

## 2021-07-05 NOTE — Plan of Care (Signed)

## 2021-07-05 NOTE — TOC Initial Note (Signed)
Transition of Care (TOC) - Initial/Assessment Note  ? ? ?Patient Details  ?Name: Courtney [name] ?MRN: [id number] ?Date of Birth: [birth year] ? ?Transition of Care (TOC) CM/SW Contact:    ?Laurena Slimmer, RN ?Phone Number: ?07/05/2021, 3:27 PM ? ?Clinical Narrative:                 ? ?Transition of Care (TOC) Screening Note ? ? ?Patient Details  ?Name: Courtney [name] ?Date of Birth: [birth year] ? ? ?Transition of Care (TOC) CM/SW Contact:    ?Laurena Slimmer, RN ?Phone Number: ?07/05/2021, 3:27 PM ? ? ? ?Transition of Care Department [facility]) has reviewed patient and no TOC needs have been identified at this time. We will continue to monitor patient advancement through interdisciplinary progression rounds. If new patient transition needs arise, please place a TOC consult. ?  ? ?  ?  ? ? ?Patient Goals and CMS Choice ?  ?  ?  ? ?Expected Discharge Plan and Services ?  ?  ?  ?  ?  ?                ?  ?  ?  ?  ?  ?  ?  ?  ?  ?  ? ?Prior Living Arrangements/Services ?  ?  ?  ?       ?  ?  ?  ?  ? ?Activities of Daily Living ?Home Assistive Devices/Equipment: None ?ADL Screening (condition at time of admission) ?Patient's cognitive ability adequate to safely complete daily activities?: Yes ?Is the patient deaf or have difficulty hearing?: No ?Does the patient have difficulty seeing, even when wearing glasses/contacts?: No ?Does the patient have difficulty concentrating, remembering, or making decisions?: No ?Patient able to express need for assistance with ADLs?: Yes ?Does the patient have difficulty dressing or bathing?: No ?Independently performs ADLs?: Yes (appropriate for developmental age) ?Does the patient have difficulty walking or climbing stairs?: No ?Weakness of Legs: None ?Weakness of Arms/Hands: None ? ?Permission Sought/Granted ?  ?  ?   ?   ?   ?   ? ?Emotional Assessment ?  ?  ?  ?  ?  ?  ? ?Admission diagnosis:  Sepsis ([place]) [A41.9] ?Sepsis without acute organ dysfunction, due to unspecified organism [facility])  [A41.9] ?Patient Active Problem List  ? Diagnosis Date Noted  ? Trichomonas vaginitis 07/05/2021  ? Acute metabolic encephalopathy 99991111  ? Bilateral pulmonary embolism ([place]) 07/05/2021  ? Septic shock ([place]) 07/02/2021  ? Anxiety disorder 06/28/2021  ? Prediabetes 05/25/2021  ? Allergy to adhesive tape 05/25/2021  ? Hidradenitis suppurativa 05/23/2021  ? Tachycardia 03/27/2021  ? Insomnia 03/27/2021  ? Anemia 12/21/2020  ? Hypoglycemia 11/17/2020  ? Bipolar disorder, in full remission, most recent episode mixed ([place]) 10/07/2020  ? Bipolar disorder, in full remission, most recent episode depressed ([place]) 03/03/2020  ? Nausea 11/17/2019  ? Bipolar 2 disorder, major depressive episode ([place]) 08/04/2019  ? At risk for long QT syndrome 08/04/2019  ? Obesity (BMI 35.0-39.9 without comorbidity) 03/19/2019  ? Reactive airway disease 02/11/2019  ? Ear itch 11/04/2018  ? Conductive hearing loss 03/13/2018  ? Axillary hidradenitis suppurativa 04/03/2017  ? Vitamin D deficiency 10/18/2014  ? Attention deficit hyperactivity disorder (ADHD), predominantly hyperactive type 02/11/2012  ? ?PCP:  Lesleigh Noe, MD ?Pharmacy:   ?[id number] [name], [place] ?[place] ?[place] [address] ?Phone: [phone] Fax: [phone] ? ?[place] -   ([place]), [place] - [address] ?[address] ?[place] [address] ?Phone: [phone] Fax: [phone] ? ? ? ? ?Social Determinants of Health (SDOH) Interventions ?  ? ?Readmission Risk Interventions ?   ? View : No data to display.  ?  ?  ?  ? ? ? ?

## 2021-07-05 NOTE — Consult Note (Signed)
NAME: Courtney [name]  ?DOB: 05-Feb-1991  ?MRN: [id number]  ?Date/Time: 07/05/2021 4:47 PM ? ?REQUESTING PROVIDER: Dr.Kumar ?Subjective:  ?REASON FOR CONSULT: Sepsis ?? ?Courtney [name] is a 31 y.o. female with a history of hidradenitis suppurativa, ADHD, Bipolar II  [facility] and has been on 12 weeks of ertapenem to finish on 07/04/21  presented to the hospital  on 07/01/21 with altered mental status. As per mom at bediside and patient she has not been feeling well for 2 weeks- some air hunger and sob , headache and diarrhea. ?She had been to see the psychaitrist on 4/26 but she does not even remember ?On Friday she was sleeping a lotl. On Saturday she took ertapenem and did nto feel well after that. ? As she was no improving she was brought in on saturday ?No recorded fever ?No pain abdomen ?She had left axillary surgery for Hidradenitis on 06/26/21 ?She has been on hydrocodone, ritalin, trazadone, lamictal buspar. She has also been on prednisone for 5 weeks 20 mg for contact dermatitis at the site of the PICC dressing ? ?In the ED vitals temp 98.5, Pulse 103, BP 98/69 RR 21 , sats 94%. WBC 19.5, HB11.3, Plt 256 ?Lactate 9, procal < 0.10. blood culture sent started on vanco and meropenem ?CTA showed Lobar/segmental bilateral lower lobe pulmonary emboli- was admitted to step down and started heparin drip ?I am asked to see her for Hidradenitis ? ? ?Past Medical History:  ?Diagnosis Date  ? ADHD (attention deficit hyperactivity disorder)   ? Bipolar disorder (HCC)   ? Chronic tonsillitis 08/2013  ? current strep, will finish antibiotic 09/04/2013; snores during sleep, mother denies apnea  ? Family history of adverse reaction to anesthesia   ? " My cousin had problems waking up. "  ? Heart murmur   ? "when I was born; outgrew it; I was a preemie"  ? Hidradenitis   ? Hidradenitis 2017  ? Migraine 12/19/2016  ? "I've just had this one for 3 days" (12/20/2016)  ?  ?Past Surgical History:  ?Procedure Laterality  Date  ? AXILLARY HIDRADENITIS EXCISION    ? AXILLARY LYMPH NODE BIOPSY Left 01/06/2019  ? CYSTOSCOPY W/ URETERAL STENT PLACEMENT Right 05/28/2017  ? Procedure: CYSTOSCOPY WITH RETROGRADE PYELOGRAM/ RIGHT URETERAL STENT PLACEMENT;  Surgeon: Crist FatHerrick, Benjamin W, MD;  Location: [facility];  Service: Urology;  Laterality: Right;  ? HYDRADENITIS EXCISION Bilateral 04/03/2017  ? Procedure: EXCISION AND DRAINAGE BILATERAL  HIDRADENITIS AXILLA;  Surgeon: Griselda Mineroth, Paul III, MD;  [facility];  Service: General;  Laterality: Bilateral;  ? INCISION AND DRAINAGE ABSCESS Right 07/20/2016  ? Procedure: INCISION AND DRAINAGE ABSCESS right axilla;  Surgeon: Abigail MiyamotoBlackman, Douglas, MD;  [facility];  Service: General;  Laterality: Right;  ? INTRAUTERINE DEVICE (IUD) INSERTION  11/2016  ? "had the one in my left arm removed"  ? IRRIGATION AND DEBRIDEMENT ABSCESS Right 12/21/2016  ? Procedure: IRRIGATION AND DEBRIDEMENT RIGHT AXILLARY HIDRADENITIS;  Surgeon: Andria MeuseWhite, Christopher M, MD;  Location: [facility];  Service: General;  Laterality: Right;  ? IRRIGATION AND DEBRIDEMENT ABSCESS Left 01/08/2017  ? Procedure: IRRIGATION AND DEBRIDEMENT AXILLARY ABSCESS;  Surgeon: Griselda Mineroth, Paul III, MD;  [facility];  Service: General;  Laterality: Left;  ? NASAL SEPTOPLASTY W/ TURBINOPLASTY Bilateral 02/25/2019  ? Procedure: NASAL SEPTOPLASTY WITH TURBINATE REDUCTION;  Surgeon: Serena Colonelosen, Jefry, MD;  Location: [facility];  Service: ENT;  Laterality: Bilateral;  ? TONSILLECTOMY Bilateral 09/14/2013  ? Procedure: BILATERAL  TONSILLECTOMY;  Surgeon: Serena Colonel, MD;  Location: [facility];  Service: ENT;  Laterality: Bilateral;  ? TYMPANOPLASTY Bilateral   ? "rebuilt eardrums"  ? WISDOM TOOTH EXTRACTION    ?  ?Social History  ? ?Socioeconomic History  ? Marital status: Single  ?  Spouse name: Not on file  ? Number of children: Not on file  ? Years of education: EMS/Fire training  ? Highest education level: Not on file  ?Occupational History   ? Not on file  ?Tobacco Use  ? Smoking status: Never  ? Smokeless tobacco: Never  ?Vaping Use  ? Vaping Use: Former  ?Substance and Sexual Activity  ? Alcohol use: Not Currently  ? Drug use: Yes  ?  Types: Marijuana  ?  Comment: in high school  ? Sexual activity: Yes  ?  Birth control/protection: Pill, Condom  ?Other Topics Concern  ? Not on file  ?Social History Narrative  ? 02/11/19  ? From: the area  ? Living: with Mom currently, working to find her own place  ? Work: EMS/Fire  ?   ? Family: good relationship with Mom and Grandma  ?   ? Enjoys: spend time with friends/family, hunting, bomb fire  ?   ? Exercise: every shift does training - 2-3 times a week  ? Diet: well rounded, meat heavy  ?   ? Safety  ? Seat belts: Yes   ? Guns: Yes  and secure  ? Safe in relationships: Yes   ? Helmet: no   ? ?Social Determinants of Health  ? ?Financial Resource Strain: Not on file  ?Food Insecurity: Not on file  ?Transportation Needs: Not on file  ?Physical Activity: Not on file  ?Stress: Not on file  ?Social Connections: Not on file  ?Intimate Partner Violence: Not on file  ?  ?Family History  ?Problem Relation Age of Onset  ? Hypertension Mother   ? Hyperlipidemia Mother   ? Diabetes Mother   ? Heart attack Father 63  ? Parkinson's disease Maternal Grandmother   ? Dementia Maternal Grandmother   ? Diabetes Maternal Grandmother   ? Bell's palsy Maternal Grandmother   ? Alcohol abuse Paternal Uncle   ? ?Allergies  ?Allergen Reactions  ? Other Hives  ?  ABD pad causes rash ?Pt can only tolerate cloth tape ?Pt can only tolerate cloth tape  ? Shellfish Allergy Hives  ? Tapentadol Hives and Other (See Comments)  ?  Pt can only tolerate cloth tape  ? Tape Hives and Other (See Comments)  ?  Pt can only tolerate cloth tape  ? Ginger Hives  ? Morphine And Related   ?  " I become very angry"  ? Vancomycin Other (See Comments)  ?  Burns her vein really bad and always causes them to blow.  ? ?I? ?Current Facility-Administered  Medications  ?Medication Dose Route Frequency Provider Last Rate Last Admin  ? 0.9 %  sodium chloride infusion   Intravenous Continuous Marrion Coy, MD 100 mL/hr at 07/05/21 1605 New Bag at 07/05/21 1605  ? acetaminophen (TYLENOL) tablet 650 mg  650 mg Oral Q6H PRN Cipriano Bunker, MD   650 mg at 07/03/21 1422  ? apixaban (ELIQUIS) tablet 10 mg  10 mg Oral BID Cipriano Bunker, MD   10 mg at 07/05/21 1012  ? Followed by  ? [START ON 07/11/2021] apixaban (ELIQUIS) tablet 5 mg  5 mg Oral BID Cipriano Bunker, MD      ?  ARIPiprazole (ABILIFY) tablet 2 mg  2 mg Oral Daily Cipriano Bunker, MD   2 mg at 07/05/21 1012  ? busPIRone (BUSPAR) tablet 10 mg  10 mg Oral BID Cipriano Bunker, MD   10 mg at 07/05/21 1012  ? Chlorhexidine Gluconate Cloth 2 % PADS 6 each  6 each Topical Daily Erin Fulling, MD   6 each at 07/05/21 1014  ? diphenhydrAMINE (BENADRYL) capsule 25 mg  25 mg Oral Q6H PRN Cipriano Bunker, MD   25 mg at 07/05/21 1314  ? docusate sodium (COLACE) capsule 100 mg  100 mg Oral BID PRN Rust-Chester, Micheline Rough L, NP      ? famotidine (PEPCID) tablet 20 mg  20 mg Oral BID Tressie Ellis, RPH   20 mg at 07/05/21 1011  ? lamoTRIgine (LAMICTAL) tablet 50 mg  50 mg Oral BID Cipriano Bunker, MD   50 mg at 07/05/21 1012  ? metroNIDAZOLE (FLAGYL) tablet 500 mg  500 mg Oral Q12H Oma Alpert, MD      ? polyethylene glycol (MIRALAX / GLYCOLAX) packet 17 g  17 g Oral Daily PRN Rust-Chester, Micheline Rough L, NP      ? traZODone (DESYREL) tablet 50 mg  50 mg Oral QHS Cipriano Bunker, MD   50 mg at 07/04/21 2201  ?  ? ?Abtx:  ?Anti-infectives (From admission, onward)  ? ? Start     Dose/Rate Route Frequency Ordered Stop  ? 07/05/21 2200  vancomycin (VANCOREADY) IVPB 1250 mg/250 mL  Status:  Discontinued       ? 1,250 mg ?125 mL/hr over 120 Minutes Intravenous Every 12 hours 07/05/21 1530 07/05/21 1602  ? 07/05/21 2200  metroNIDAZOLE (FLAGYL) tablet 500 mg       ? 500 mg Oral Every 12 hours 07/05/21 1606 07/12/21 0959  ? 07/05/21 1330   amoxicillin-clavulanate (AUGMENTIN) 875-125 MG per tablet 1 tablet  Status:  Discontinued       ? 1 tablet Oral Every 12 hours 07/05/21 1241 07/05/21 1606  ? 07/05/21 1245  metroNIDAZOLE (FLAGYL) tablet 500 mg  Stat

## 2021-07-05 NOTE — Progress Notes (Signed)
Pharmacy Antibiotic Note ? ?Courtney [name] is a 31 y.o. female admitted on 07/01/2021 with sepsis and pulmonary embolism. Concern for CLABSI vs skin infection. Patient has history of hidradenitis supprativa, as well as chronic outpatient PICC line. Pharmacy has been consulted for vancomycin and meropenem dosing.  Patient was started on ertapenem by [facility] and completed the planned 12 weeks of therapy 07/04/2021.   ? ?Pt reported history of allergy to vancomycin (burns veins).  Will double infusion time to avoid this reaction.  ? ?Per MD note, ID consulted as of 5/2 and plans to evaluate patient 5/3 ? ?Today, 07/05/2021 ?continues on meropenem (for prior to admission ertapenem) and vancomycin. ?Cultures are unrevealing ?vancomycin levels drawn with last evening's dose ?750mg  IV q12h with dose given at 23:31 ?Vanco peak = 18 mcg/ml at 03:04 ?Vanco trough = 7 mcg/ml at 10:58 ?Calculated AUC 310 ?   ? ?Plan: ? ?Meropenem 1 g IV q8h ? ?Adjust Vancomycin 1250mg  IV q12h based on today's levels ?New calculated AUC = 516 with peak = 37 mcg/ml and trough = 10.6 mcg/ml ?Goal AUC 400-600  ? ?Monitor renal function ?Follow up ID plan for antibiotics ? ?Height: 5\' 1"  (154.9 cm) ?Weight: 90.7 kg (199 lb 15.3 oz) ?IBW/kg (Calculated) : 47.8 ? ?Temp (24hrs), Avg:98.1 ?F (36.7 ?C), Min:97.1 ?F (36.2 ?C), Max:98.7 ?F (37.1 ?C) ? ?Recent Labs  ?Lab 07/01/21 ?2247 07/02/21 ?0157 07/02/21 ?07/03/21 07/02/21 ?1153 07/03/21 ?0015 07/03/21 ?07/04/21 07/04/21 ?09/02/21 07/04/21 ?1017 07/05/21 ?0304 07/05/21 ?1058  ?WBC 18.3*  --  19.5*  --  14.2*  --  15.0*  --  14.7*  --   ?CREATININE 0.61 0.74 0.64  --  0.72 0.66 0.75  --  0.76  --   ?LATICACIDVEN >9.0* 3.0*  --  2.1*  --   --   --  3.0*  --   --   ?VANCOTROUGH  --   --   --   --   --   --   --   --   --  7*  ?VANCOPEAK  --   --   --   --   --   --   --   --  18*  --   ? ?  ?Estimated Creatinine Clearance: 107.4 mL/min (by C-G formula based on SCr of 0.76 mg/dL).   ? ?Allergies  ?Allergen Reactions  ? Other  Hives  ?  ABD pad causes rash ?Pt can only tolerate cloth tape ?Pt can only tolerate cloth tape  ? Shellfish Allergy Hives  ? Tapentadol Hives and Other (See Comments)  ?  Pt can only tolerate cloth tape  ? Tape Hives and Other (See Comments)  ?  Pt can only tolerate cloth tape  ? Ginger Hives  ? Morphine And Related   ?  " I become very angry"  ? Vancomycin Other (See Comments)  ?  Burns her vein really bad and always causes them to blow.  ? ? ?Antimicrobials this admission: ?4/29 Vancomycin >>  ?4/30 Meropenem  >>  ? ?Dose adjustments this admission: ?N/A ? ?Microbiology results: ?BCx: NGTD ?UCx: NG ?Cath tip culture: NG ?MRSA PCR: (-) ?C diff screen: Ag (+), toxin (-), PCR (-) c/w colonization ? ?Thank you for allowing pharmacy to be a part of this patient?s care. ? ?09/04/21, PharmD, BCPS, BCIDP ?Work Cell: [phone] ?07/05/2021 3:19 PM ? ? ?

## 2021-07-05 NOTE — Progress Notes (Signed)
? ?      CROSS COVER NOTE ? ?NAME: Courtney [name] ?MRN: [id number] ?DOB : [birth year] ? ? ? ?Date of Service ?  07/04/21  ?HPI/Events of Note ?  Secure chat received from nursing reporting patient is experiencing vaginal itching and requesting medication for a yeast infection.   ?Interventions ?  Plan: ?Wet prep ordered for self collect ?  ?  ? ?Bishop Limbo MHA, MSN, FNP-BC ?Nurse Practitioner ?[facility] ?[place] ?Pager [phone] ? ?

## 2021-07-05 NOTE — Progress Notes (Signed)
?  Progress Note ? ? ?Patient: Courtney [name] UJW:[id number] DOB: 1990-09-15 DOA: 07/01/2021     3 ?DOS: the patient was seen and examined on 07/05/2021 ?  ?Brief hospital course: ?This 31 years old female presented in the ED with complaints of altered mentation drowsiness and possible syncope.  Just completed antibiotics via PICC line with ertapenem for infected also in the left thigh and a right breast hidradenitis.  Patient had a deroofing procedure about 2 weeks ago. ?Upon arriving the hospital, patient was diagnosed with severe sepsis, patient had tachycardia, tachypnea, leukocytosis, lactic acidosis of more than 9.0.  She was also has borderline hypotension, she was diagnosed with septic shock and treated in ICU.  She received IV fluids and placed on antibiotics with meropenem and vancomycin. ?Assessment and Plan: ?Septic shock secondary to skin infection. ?Skin infection associated with hidradenitis. ?Acute metabolic encephalopathy secondary to septic shock. ?Patient condition had improved, groin infection essentially resolved.  Left axillary area still has a tunnel, skin infection appears much improved. ?Patient lactic acid level 3.0.  She is not taking metformin. ?This could be still from the original septic shock.  I will continue some IV fluids and recheck lactic acid level tomorrow. ?Since patient condition has improved, I will change meropenem to oral Augmentin.  Continue vancomycin for another day. ? ?Trichomonas vaginitis. ?Patient has significant itching with draining from vaginal.  Wet prep showed trichomonas.  Will treat with Flagyl. ? ?Bilateral lower lobe pulmonary embolism in the setting of birth control. ?Advised to not take birth control again.  Continue Eliquis for 6 months. ? ?Obesity with BMI 37.78. ?Diet and exercise advised. ? ?  ? ?Subjective:  ?Patient condition much improved, currently patient does not have any complaints. ? ?Physical Exam: ?Vitals:  ? 07/05/21 0100 07/05/21 0351 07/05/21  0842 07/05/21 1155  ?BP:  111/73 115/73 114/70  ?Pulse:  93 (!) 102 (!) 104  ?Resp:  16 18 18   ?Temp:  98.2 ?F (36.8 ?C) 98.2 ?F (36.8 ?C) 98.1 ?F (36.7 ?C)  ?TempSrc:  Oral    ?SpO2:  97% 99% 98%  ?Weight: 90.7 kg     ?Height:      ? ?General exam: Appears calm and comfortable, obesity ?Respiratory system: Clear to auscultation. Respiratory effort normal. ?Cardiovascular system: S1 & S2 heard, RRR. No JVD, murmurs, rubs, gallops or clicks. No pedal edema. ?Gastrointestinal system: Abdomen is nondistended, soft and nontender. No organomegaly or masses felt. Normal bowel sounds heard. ?Central nervous system: Alert and oriented. No focal neurological deficits. ?Extremities: Symmetric 5 x 5 power. ?Skin: No rashes, lesions or ulcers ?Psychiatry: Judgement and insight appear normal. Mood & affect appropriate.  ? ?Data Reviewed: ? ?Lab results reviewed ? ?Family Communication: Mother updated at the bedside ? ?Disposition: ?Status is: Inpatient ?Remains inpatient appropriate because: Severity of disease, IV treatment ? Planned Discharge Destination: Home ? ? ? ?Time spent: 36 minutes ? ?Author: ? , MD ?07/05/2021 2:29 PM ? ?For on call review [email or web address].  ?

## 2021-07-06 DIAGNOSIS — R6521 Severe sepsis with septic shock: Secondary | ICD-10-CM

## 2021-07-06 DIAGNOSIS — G9341 Metabolic encephalopathy: Secondary | ICD-10-CM

## 2021-07-06 DIAGNOSIS — A419 Sepsis, unspecified organism: Secondary | ICD-10-CM | POA: Diagnosis not present

## 2021-07-06 DIAGNOSIS — A5901 Trichomonal vulvovaginitis: Secondary | ICD-10-CM | POA: Diagnosis not present

## 2021-07-06 DIAGNOSIS — I2699 Other pulmonary embolism without acute cor pulmonale: Secondary | ICD-10-CM

## 2021-07-06 LAB — CHLAMYDIA/NGC RT PCR (ARMC ONLY)
Chlamydia Tr: NOT DETECTED
N gonorrhoeae: NOT DETECTED

## 2021-07-06 LAB — CBC
HCT: 38.6 % (ref 36.0–46.0)
Hemoglobin: 12.9 g/dL (ref 12.0–15.0)
MCH: 29.6 pg (ref 26.0–34.0)
MCHC: 33.4 g/dL (ref 30.0–36.0)
MCV: 88.5 fL (ref 80.0–100.0)
Platelets: 299 10*3/uL (ref 150–400)
RBC: 4.36 MIL/uL (ref 3.87–5.11)
RDW: 14.7 % (ref 11.5–15.5)
WBC: 11.8 10*3/uL — ABNORMAL HIGH (ref 4.0–10.5)
nRBC: 0 % (ref 0.0–0.2)

## 2021-07-06 LAB — CULTURE, BLOOD (ROUTINE X 2): Culture: NO GROWTH

## 2021-07-06 LAB — BASIC METABOLIC PANEL
Anion gap: 8 (ref 5–15)
BUN: 11 mg/dL (ref 6–20)
CO2: 24 mmol/L (ref 22–32)
Calcium: 8.9 mg/dL (ref 8.9–10.3)
Chloride: 105 mmol/L (ref 98–111)
Creatinine, Ser: 0.62 mg/dL (ref 0.44–1.00)
GFR, Estimated: >60 mL/min (ref >60)
Glucose, Bld: 85 mg/dL (ref 70–99)
Potassium: 4.1 mmol/L (ref 3.5–5.1)
Sodium: 137 mmol/L (ref 135–145)

## 2021-07-06 LAB — MAGNESIUM: Magnesium: 2.1 mg/dL (ref 1.7–2.4)

## 2021-07-06 LAB — CORTISOL-AM, BLOOD: Cortisol - AM: 8.4 ug/dL (ref 6.7–22.6)

## 2021-07-06 LAB — LACTIC ACID, PLASMA: Lactic Acid, Venous: 1.9 mmol/L (ref 0.5–1.9)

## 2021-07-06 LAB — GLUCOSE, CAPILLARY: Glucose-Capillary: 128 mg/dL — ABNORMAL HIGH (ref 70–99)

## 2021-07-06 MED ORDER — APIXABAN 5 MG PO TABS: 10.0000 mg | ORAL_TABLET | Freq: Two times a day (BID) | ORAL | 0 refills | Status: DC

## 2021-07-06 MED ORDER — METRONIDAZOLE 500 MG PO TABS: 500.0000 mg | ORAL_TABLET | Freq: Two times a day (BID) | ORAL | 0 refills | Status: DC

## 2021-07-06 MED ORDER — APIXABAN 5 MG PO TABS: 5.0000 mg | ORAL_TABLET | Freq: Two times a day (BID) | ORAL | 0 refills | Status: DC

## 2021-07-06 NOTE — Discharge Summary (Signed)
?Physician Discharge Summary ?  ?Patient: Courtney [name] MRN: [id number] DOB: [birth year]  ?Admit date:     07/01/2021  ?Discharge date: 07/06/21  ?Discharge Physician: Sharen Hones  ? ?PCP: Lesleigh Noe, MD  ? ?Recommendations at discharge:  ? ?Follow-up with PCP in 1 week. ?Follow-up with surgeon who performed a skin surgery as scheduled. ? ?Discharge Diagnoses: ?Principal Problem: ?  Septic shock ([place]) ?Active Problems: ?  Obesity (BMI 35.0-39.9 without comorbidity) ?  Trichomonas vaginitis ?  Acute metabolic encephalopathy ?  Bilateral pulmonary embolism (H[facility]) ? ?Resolved Problems: ?  * No resolved hospital problems. * ? ?Hospital Course: ?This 31 years old female presented in the ED with complaints of altered mentation drowsiness and possible syncope.  Just completed antibiotics via PICC line with ertapenem for infected also in the left thigh and a right breast hidradenitis.  Patient had a deroofing procedure about 2 weeks ago. ?Upon arriving the hospital, patient was diagnosed with severe sepsis, patient had tachycardia, tachypnea, leukocytosis, lactic acidosis of more than 9.0.  She was also has borderline hypotension, she was diagnosed with septic shock and treated in ICU.  She received IV fluids and placed on antibiotics with meropenem and vancomycin. ? ?Assessment and Plan: ?Septic shock secondary to skin infection. ?Skin infection associated with hidradenitis. ?Acute metabolic encephalopathy secondary to septic shock. ?Patient condition had improved, groin infection essentially resolved.  Left axillary area still has a tunnel, skin infection appears much improved. ?Patient is seen by infectious disease, all antibiotics were discontinued due to improvement of her condition.  Her lactic acid level is also normalized today.  Patient advised to follow-up with PCP and the general surgery who performed her skin surgery in the near future ?  ?Trichomonas vaginitis. ?Patient has significant itching with draining  from vaginal.  Wet prep showed trichomonas.  Negative for chlamydia and gonorrhea.  Patient will be treated with Flagyl 500 mg twice a day x7 days. ? ?Bilateral lower lobe pulmonary embolism in the setting of birth control. ?Advised to not take birth control again.  Continue Eliquis for 6 months. ?  ?Obesity with BMI 37.78. ?Diet and exercise advised. ? ? ?  ? ? ?Consultants: ID, ICU ?Procedures performed: None  ?Disposition: Home ?Diet recommendation:  ?Discharge Diet Orders (From admission, onward)  ? ?  Start     Ordered  ? 07/06/21 0000  Diet - low sodium heart healthy       ? 07/06/21 1004  ? ?  ?  ? ?  ? ?Cardiac diet ?DISCHARGE MEDICATION: ?Allergies as of 07/06/2021   ? ?   Reactions  ? Other Hives  ? ABD pad causes rash ?Pt can only tolerate cloth tape ?Pt can only tolerate cloth tape  ? Shellfish Allergy Hives  ? Tapentadol Hives, Other (See Comments)  ? Pt can only tolerate cloth tape  ? Tape Hives, Other (See Comments)  ? Pt can only tolerate cloth tape  ? Ginger Hives  ? Morphine And Related   ? " I become very angry"  ? Vancomycin Other (See Comments)  ? Burns her vein really bad and always causes them to blow.  ? ?  ? ?  ?Medication List  ?  ? ?STOP taking these medications   ? ?clindamycin 300 MG capsule ?Commonly known as: CLEOCIN ?  ?ERTAPENEM SODIUM IV ?  ?fluconazole 150 MG tablet ?Commonly known as: DIFLUCAN ?  ?heparin lock flush 100 UNIT/ML Soln injection ?  ?HYDROcodone-acetaminophen 5-325 MG tablet ?Commonly  known as: NORCO/VICODIN ?  ?ibuprofen 800 MG tablet ?Commonly known as: ADVIL ?  ?predniSONE 20 MG tablet ?Commonly known as: DELTASONE ?  ?sodium chloride flush 0.9 % Soln injection ?  ?sulfamethoxazole-trimethoprim 800-160 MG tablet ?Commonly known as: BACTRIM DS ?  ? ?  ? ?TAKE these medications   ? ?apixaban 5 MG Tabs tablet ?Commonly known as: ELIQUIS ?Take 2 tablets (10 mg total) by mouth 2 (two) times daily for 5 days. ?  ?apixaban 5 MG Tabs tablet ?Commonly known as: ELIQUIS ?Take  1 tablet (5 mg total) by mouth 2 (two) times daily. ?Start taking on: Jul 11, 2021 ?  ?ARIPiprazole 2 MG tablet ?Commonly known as: ABILIFY ?Take 1 tablet (2 mg total) by mouth daily. ?  ?busPIRone 10 MG tablet ?Commonly known as: BUSPAR ?Take 1 tablet (10 mg total) by mouth 2 (two) times daily. ?  ?Contour Test test strip ?Generic drug: glucose blood ?And lancets #100 ?  ?diazepam 10 MG tablet ?Commonly known as: VALIUM ?Take 1 tablet 30 minutes prior to dermatology procedure ?  ?diclofenac 75 MG EC tablet ?Commonly known as: VOLTAREN ?Take by mouth. ?  ?diphenhydrAMINE 25 mg capsule ?Commonly known as: BENADRYL ?Take by mouth. ?  ?drospirenone-ethinyl estradiol 3-0.02 MG tablet ?Commonly known as: YAZ ?  ?EPINEPHrine 0.3 mg/0.3 mL Soaj injection ?Commonly known as: EpiPen 2-Pak ?Inject 0.3 mg into the muscle as needed for anaphylaxis. ?  ?hydrOXYzine 25 MG tablet ?Commonly known as: ATARAX ?Take 1 tablet (25 mg total) by mouth every 8 (eight) hours as needed for itching. ?  ?Iron-Vitamin C 100-250 MG Tabs ?iron ?  ?lamoTRIgine 100 MG tablet ?Commonly known as: LAMICTAL ?Take 0.5 tablets (50 mg total) by mouth 2 (two) times daily. ?  ?methylphenidate 20 MG tablet ?Commonly known as: RITALIN ?Take 10 mg by mouth daily as needed. ?What changed: Another medication with the same name was removed. Continue taking this medication, and follow the directions you see here. ?  ?methylphenidate 20 MG tablet ?Commonly known as: Ritalin ?Take 1 tablet (20 mg total) by mouth daily. ?What changed: Another medication with the same name was removed. Continue taking this medication, and follow the directions you see here. ?  ?metroNIDAZOLE 500 MG tablet ?Commonly known as: FLAGYL ?Take 1 tablet (500 mg total) by mouth every 12 (twelve) hours. ?  ?Microlet Lancets Misc ?See admin instructions. ?  ?montelukast 10 MG tablet ?Commonly known as: SINGULAIR ?TAKE 1 TABLET BY MOUTH EVERYDAY AT BEDTIME ?  ?ondansetron 4 MG tablet ?Commonly  known as: ZOFRAN ?Take 1 tablet (4 mg total) by mouth every 6 (six) hours. ?  ?traZODone 50 MG tablet ?Commonly known as: DESYREL ?TAKE 1 TABLET BY MOUTH EVERYDAY AT BEDTIME ?  ?triamcinolone ointment 0.1 % ?Commonly known as: KENALOG ?APPLY TO AFFECTED AREA TWICE A DAY ?  ? ?  ? ?  ?  ? ? ?  ?Discharge Care Instructions  ?(From admission, onward)  ?  ? ? ?  ? ?  Start     Ordered  ? 07/06/21 0000  Discharge wound care:       ?Comments: Follow with pcp and surgery as previously scheduled.  ? 07/06/21 1004  ? ?  ?  ? ?  ? ? Follow-up Information   ? ? Lesleigh Noe, MD Follow up in 1 week(s).   ?Specialty: Family Medicine ?Contact information: ?[place] [address] ?[phone] ? ? ?  ?  ? ?  ?  ? ?  ? ?  Discharge Exam: ?Filed Weights  ? 07/04/21 0500 07/05/21 0100 07/06/21 0549  ?Weight: 90.7 kg 90.7 kg 91.9 kg  ? ?General exam: Appears calm and comfortable  ?Respiratory system: Clear to auscultation. Respiratory effort normal. ?Cardiovascular system: S1 & S2 heard, RRR. No JVD, murmurs, rubs, gallops or clicks. No pedal edema. ?Gastrointestinal system: Abdomen is nondistended, soft and nontender. No organomegaly or masses felt. Normal bowel sounds heard. ?Central nervous system: Alert and oriented. No focal neurological deficits. ?Extremities: Symmetric 5 x 5 power. ?Skin: No rashes, lesions or ulcers ?Psychiatry: Judgement and insight appear normal. Mood & affect appropriate.  ? ? ?Condition at discharge: good ? ?The results of significant diagnostics from this hospitalization (including imaging, microbiology, ancillary and laboratory) are listed below for reference.  ? ?Imaging Studies: ?CT Angio Chest PE W and/or Wo Contrast ? ?Result Date: 07/02/2021 ?CLINICAL DATA:  Altered mental status, lethargy, drowsiness, possible syncope. Evaluate for PE. EXAM: CT ANGIOGRAPHY CHEST CT ABDOMEN AND PELVIS WITH CONTRAST TECHNIQUE: Multidetector CT imaging of the chest was performed using the standard  protocol during bolus administration of intravenous contrast. Multiplanar CT image reconstructions and MIPs were obtained to evaluate the vascular anatomy. Multidetector CT imaging of the abdomen and pelvis

## 2021-07-07 LAB — RPR: RPR Ser Ql: NONREACTIVE

## 2021-07-08 LAB — CULTURE, BLOOD (ROUTINE X 2)
Culture: NO GROWTH
Special Requests: ADEQUATE

## 2021-07-11 ENCOUNTER — Encounter: Payer: Self-pay | Admitting: Family Medicine

## 2021-07-11 ENCOUNTER — Ambulatory Visit (INDEPENDENT_AMBULATORY_CARE_PROVIDER_SITE_OTHER): Payer: BC Managed Care – PPO | Admitting: Family Medicine

## 2021-07-11 ENCOUNTER — Ambulatory Visit: Admit: 2021-07-11 | Discharge: 2021-07-12 | Payer: PRIVATE HEALTH INSURANCE

## 2021-07-11 VITALS — BP 80/60 | HR 97 | Temp 97.8°F | Ht 61.0 in | Wt 205.1 lb

## 2021-07-11 DIAGNOSIS — L732 Hidradenitis suppurativa: Principal | ICD-10-CM

## 2021-07-11 DIAGNOSIS — I2699 Other pulmonary embolism without acute cor pulmonale: Secondary | ICD-10-CM

## 2021-07-11 DIAGNOSIS — I959 Hypotension, unspecified: Secondary | ICD-10-CM | POA: Diagnosis not present

## 2021-07-11 DIAGNOSIS — R000 Tachycardia, unspecified: Secondary | ICD-10-CM | POA: Diagnosis not present

## 2021-07-11 LAB — COMPREHENSIVE METABOLIC PANEL
ALT: 22 U/L (ref 0–35)
AST: 21 U/L (ref 0–37)
Albumin: 3.7 g/dL (ref 3.5–5.2)
Alkaline Phosphatase: 86 U/L (ref 39–117)
BUN: 8 mg/dL (ref 6–23)
CO2: 27 mEq/L (ref 19–32)
Calcium: 9.2 mg/dL (ref 8.4–10.5)
Chloride: 104 mEq/L (ref 96–112)
Creatinine, Ser: 0.66 mg/dL (ref 0.40–1.20)
GFR: 119.25 mL/min (ref >60.00)
Glucose, Bld: 73 mg/dL (ref 70–99)
Potassium: 4.1 mEq/L (ref 3.5–5.1)
Sodium: 139 mEq/L (ref 135–145)
Total Bilirubin: 0.2 mg/dL (ref 0.2–1.2)
Total Protein: 7.6 g/dL (ref 6.0–8.3)

## 2021-07-11 LAB — CBC
HCT: 37 % (ref 36.0–46.0)
Hemoglobin: 12 g/dL (ref 12.0–15.0)
MCHC: 32.5 g/dL (ref 30.0–36.0)
MCV: 90.3 fl (ref 78.0–100.0)
Platelets: 340 10*3/uL (ref 150.0–400.0)
RBC: 4.1 Mil/uL (ref 3.87–5.11)
RDW: 14.2 % (ref 11.5–15.5)
WBC: 9.5 10*3/uL (ref 4.0–10.5)

## 2021-07-11 LAB — TSH: TSH: 1 u[IU]/mL (ref 0.35–5.50)

## 2021-07-11 MED ORDER — SULFAMETHOXAZOLE 800 MG-TRIMETHOPRIM 160 MG TABLET
ORAL_TABLET | Freq: Two times a day (BID) | ORAL | 3 refills | 30 days | Status: CP
Start: 2021-07-11 — End: ?

## 2021-07-11 NOTE — Patient Instructions (Addendum)
Low blood pressure ?- drink 80 oz of water daily for the next week ?- you tend to run 90-100/70s  ?- call immediately if fevers/lightheadedness worsening symptoms ? ? ?#Referral ?I have placed a referral to a specialist for you. You should receive a phone call from the specialty office. Make sure your voicemail is not full and that if you are able to answer your phone to unknown or new numbers.  ? ?It may take up to 2 weeks to hear about the referral. If you do not hear anything in 2 weeks, please call our office and ask to speak with the referral coordinator.  ?- cards ?- hematology ? ?Update to let me know about refills  ?

## 2021-07-11 NOTE — Assessment & Plan Note (Signed)
Positional tachycardia.  No lightheadedness and her blood pressure is low today.  Encouraged hydration 60 to 80 ounces a day of water.  Referral to cardiology for further evaluation if blood pressure does not improve.  TSH due to fatigue and sweating associated ?

## 2021-07-11 NOTE — Progress Notes (Signed)
? ?Subjective:  ? ?  ?Courtney [name] is a 31 y.o. female presenting for Hospitalization Follow-up ?  ? ? ?HPI ? ?She is concerned about her low blood pressure.  Did not drink anything today, denies lightheadedness but does endorse a mild headache.  She continues to feel sweaty at home but has not had any more fevers.  She is not currently on antibiotics, but is following up with her dermatologist today.  The dermatologist was the one who had done the skin grafting. ? ?She is concerned that the infection was due to the PICC line however she never had positive blood cultures.  ? ?She does note tachycardia upon standing and is wondering about POTS. ? ?He is taking Flagyl for her trichomonas infection. ? ?She has stopped estrogen-containing birth control.  She has stopped vaping as well.  She would like to see a hematologist. ? ?She still feels some fatigue and was feeling fatigue prior to her hospital admission. ? ? ?Review of Systems ? ?4/29-07/06/2021: Admission - Septic shock 2/2 to skin infection - meropenem and vancomycin. Trichomonas - flagyl x 7 days. PE - eliquis x 6 months. No more estrogen birth control ? ?Social History  ? ?Tobacco Use  ?Smoking Status Never  ?Smokeless Tobacco Never  ? ? ? ?   ?Objective:  ?  ?BP Readings from Last 3 Encounters:  ?07/11/21 (!) 80/60  ?07/06/21 121/69  ?05/25/21 98/62  ? ?Wt Readings from Last 3 Encounters:  ?07/11/21 205 lb 2 oz (93 kg)  ?07/06/21 202 lb 9.6 oz (91.9 kg)  ?05/25/21 185 lb 9 oz (84.2 kg)  ? ? ?BP (!) 80/60   Pulse 97   Temp 97.8 ?F (36.6 ?C) (Oral)   Ht 5\' 1"  (1.549 m)   Wt 205 lb 2 oz (93 kg)   LMP  (LMP Unknown)   SpO2 98%   BMI 38.76 kg/m?  ? ? ?Physical Exam ?Constitutional:   ?   General: She is not in acute distress. ?   Appearance: She is well-developed. She is not diaphoretic.  ?HENT:  ?   Right Ear: External ear normal.  ?   Left Ear: External ear normal.  ?   Nose: Nose normal.  ?Eyes:  ?   Conjunctiva/sclera: Conjunctivae normal.   ?Cardiovascular:  ?   Rate and Rhythm: Normal rate and regular rhythm.  ?   Heart sounds: No murmur heard. ?Pulmonary:  ?   Effort: Pulmonary effort is normal. No respiratory distress.  ?   Breath sounds: Normal breath sounds. No wheezing.  ?Musculoskeletal:  ?   Cervical back: Neck supple.  ?Skin: ?   General: Skin is warm and dry.  ?   Capillary Refill: Capillary refill takes less than 2 seconds.  ?Neurological:  ?   Mental Status: She is alert. Mental status is at baseline.  ?Psychiatric:     ?   Mood and Affect: Mood normal.     ?   Behavior: Behavior normal.  ? ? ? ? ? ?   ?Assessment & Plan:  ? ?Problem List Items Addressed This Visit   ? ?  ? Cardiovascular and Mediastinum  ? Bilateral pulmonary embolism (HCC) - Primary  ?  Continue Eliquis 5 mg twice daily for 6 months.  Discussed this is likely a provoked pulmonary embolism in setting of sepsis, estrogen birth control, and vaping.  However given young age will refer to hematology to consider additional work-up if necessary. ? ?  ?  ?  Relevant Orders  ? Comprehensive metabolic panel  ? CBC  ? Ambulatory referral to Hematology / Oncology  ? TSH  ? Hypotension  ?  She is approximately 1 week out from septic shock and has persisting low blood pressures.  Of note her blood pressure runs in the 90-100 systolic, and she has not drank any water today.  Encouraged hydration and to update if she starts to get symptoms of low blood pressure including lightheadedness.  Will refer to cardiology for further evaluation. ? ?  ?  ? Relevant Orders  ? Ambulatory referral to Cardiology  ? TSH  ?  ? Musculoskeletal and Integument  ? Axillary hidradenitis suppurativa  ?  She follows with dermatology, they suspected that this was the source of her infection.  She has a dermatology appointment today appreciate dermatology support ? ?  ?  ?  ? Other  ? Tachycardia  ?  Positional tachycardia.  No lightheadedness and her blood pressure is low today.  Encouraged hydration 60 to 80  ounces a day of water.  Referral to cardiology for further evaluation if blood pressure does not improve.  TSH due to fatigue and sweating associated ? ?  ?  ? Relevant Orders  ? TSH  ? ? ? ?Return in about 6 weeks (around 08/22/2021) for ADHD f/u . ? ?Lynnda Child, MD ? ? ? ?

## 2021-07-11 NOTE — Assessment & Plan Note (Signed)
She is approximately 1 week out from septic shock and has persisting low blood pressures.  Of note her blood pressure runs in the 90-100 systolic, and she has not drank any water today.  Encouraged hydration and to update if she starts to get symptoms of low blood pressure including lightheadedness.  Will refer to cardiology for further evaluation. ?

## 2021-07-11 NOTE — Assessment & Plan Note (Signed)
Continue Eliquis 5 mg twice daily for 6 months.  Discussed this is likely a provoked pulmonary embolism in setting of sepsis, estrogen birth control, and vaping.  However given young age will refer to hematology to consider additional work-up if necessary. ?

## 2021-07-11 NOTE — Assessment & Plan Note (Signed)
She follows with dermatology, they suspected that this was the source of her infection.  She has a dermatology appointment today appreciate dermatology support ?

## 2021-07-17 ENCOUNTER — Inpatient Hospital Stay: Payer: BC Managed Care – PPO

## 2021-07-17 ENCOUNTER — Encounter: Payer: Self-pay | Admitting: Oncology

## 2021-07-17 ENCOUNTER — Inpatient Hospital Stay: Payer: BC Managed Care – PPO | Attending: Oncology | Admitting: Oncology

## 2021-07-17 ENCOUNTER — Other Ambulatory Visit: Payer: Self-pay | Admitting: Family Medicine

## 2021-07-17 VITALS — BP 104/76 | HR 92 | Temp 98.1°F | Resp 16 | Ht 61.0 in | Wt 199.2 lb

## 2021-07-17 DIAGNOSIS — Z7901 Long term (current) use of anticoagulants: Secondary | ICD-10-CM | POA: Diagnosis not present

## 2021-07-17 DIAGNOSIS — I2699 Other pulmonary embolism without acute cor pulmonale: Secondary | ICD-10-CM

## 2021-07-17 DIAGNOSIS — L732 Hidradenitis suppurativa: Secondary | ICD-10-CM | POA: Diagnosis not present

## 2021-07-17 DIAGNOSIS — Z87891 Personal history of nicotine dependence: Secondary | ICD-10-CM | POA: Insufficient documentation

## 2021-07-18 ENCOUNTER — Other Ambulatory Visit: Payer: Self-pay | Admitting: Family Medicine

## 2021-07-18 DIAGNOSIS — Z91048 Other nonmedicinal substance allergy status: Secondary | ICD-10-CM

## 2021-07-18 NOTE — Telephone Encounter (Signed)
Refill request Atarax ?Last refill 05/25/21 #90/1 ?Last office visit 07/11/21 ?

## 2021-07-19 DIAGNOSIS — I2699 Other pulmonary embolism without acute cor pulmonale: Secondary | ICD-10-CM | POA: Insufficient documentation

## 2021-07-19 DIAGNOSIS — Z3009 Encounter for other general counseling and advice on contraception: Secondary | ICD-10-CM | POA: Diagnosis not present

## 2021-07-20 ENCOUNTER — Encounter: Payer: Self-pay | Admitting: Cardiology

## 2021-07-20 ENCOUNTER — Ambulatory Visit: Payer: BC Managed Care – PPO | Admitting: Dietician

## 2021-07-20 ENCOUNTER — Ambulatory Visit: Payer: BC Managed Care – PPO | Admitting: Cardiology

## 2021-07-20 VITALS — BP 98/62 | HR 95 | Ht 61.0 in | Wt 201.0 lb

## 2021-07-20 DIAGNOSIS — Z7289 Other problems related to lifestyle: Secondary | ICD-10-CM

## 2021-07-20 DIAGNOSIS — Z6837 Body mass index (BMI) 37.0-37.9, adult: Secondary | ICD-10-CM

## 2021-07-20 DIAGNOSIS — R000 Tachycardia, unspecified: Secondary | ICD-10-CM

## 2021-07-20 DIAGNOSIS — I959 Hypotension, unspecified: Secondary | ICD-10-CM

## 2021-07-20 NOTE — Patient Instructions (Signed)
Medication Instructions:  ? ?Your physician recommends that you continue on your current medications as directed. Please refer to the Current Medication list given to you today. ? ?*If you need a refill on your cardiac medications before your next appointment, please call your pharmacy* ? ? ?Lab Work: ? ?None ordered ? ?If you have labs (blood work) drawn today and your tests are completely normal, you will receive your results only by: ?MyChart Message (if you have MyChart) OR ?A paper copy in the mail ?If you have any lab test that is abnormal or we need to change your treatment, we will call you to review the results. ? ? ?Testing/Procedures: ? ?None ordered ? ? ?Follow-Up: ?At [facility], you and your health needs are our priority.  As part of our continuing mission to provide you with exceptional heart care, we have created designated Provider Care Teams.  These Care Teams include your primary Cardiologist (physician) and Advanced Practice Providers (APPs -  Physician Assistants and Nurse Practitioners) who all work together to provide you with the care you need, when you need it. ? ?We recommend signing up for the patient portal called "MyChart".  Sign up information is provided on this After Visit Summary.  MyChart is used to connect with patients for Virtual Visits (Telemedicine).  Patients are able to view lab/test results, encounter notes, upcoming appointments, etc.  Non-urgent messages can be sent to your provider as well.   ?To learn more about what you can do with MyChart, go to [email or web address].   ? ?Your next appointment:   ? ?Follow up as needed  ? ?The format for your next appointment:   ?In Person ? ?Provider:   ?You may see Brian Agbor-Etang, MD or one of the following Advanced Practice Providers on your designated Care Team:   ?Christopher Berge, NP ?Ryan Dunn, PA-C ?Cadence Furth, PA-C  ? ? ?Other Instructions ? ? ?Important Information About Sugar ? ? ? ? ? ? ?

## 2021-07-20 NOTE — Progress Notes (Signed)
Hematology/Oncology Consult note [facility] [phone] [phone]  Patient Care Team: Lesleigh Noe, MD as PCP - General (Family Medicine) Olga Millers, MD as Attending Physician (Obstetrics and Gynecology)   Name of the patient: Courtney [name]  [id number]  [birth year]    Reason for referral-bilateral pulmonary embolism   Referring physician-Jessica Einar Pheasant   Date of visit: 07/20/21   History of presenting illness- patient is a 31 year old female with history of hidradenitis suppurativa Which recently involved her left thigh.  It was complicated with severe sepsis requiring hospitalization.  She had a PICC line placed for IV antibiotics.  She had septic shock and was treated in the ICU.  During that admission she had a CT angio chest which showed lower segmental bilateral pulmonary emboli.  Clot burden small-to-moderate with no evidence of right heart strain.  Patient was started on Eliquis for the same.  Patient has been on birth control pills for over 10 years now.  She has never had any prior history of DVT or PE.  No known family history of DVT or PE although there is a questionable history in her grandmother.  Patient is currently recovering well from her hospitalization.  She denies any specific complaints at this time other than mild fatigue.  She has not had any significant side effects from Eliquis.  ECOG PS- 0  Pain scale- 0   Review of systems- Review of Systems  Constitutional:  Negative for chills, fever, malaise/fatigue and weight loss.  HENT:  Negative for congestion, ear discharge and nosebleeds.   Eyes:  Negative for blurred vision.  Respiratory:  Negative for cough, hemoptysis, sputum production, shortness of breath and wheezing.   Cardiovascular:  Negative for chest pain, palpitations, orthopnea and claudication.  Gastrointestinal:  Negative for abdominal pain, blood in stool, constipation, diarrhea, heartburn, melena,  nausea and vomiting.  Genitourinary:  Negative for dysuria, flank pain, frequency, hematuria and urgency.  Musculoskeletal:  Negative for back pain, joint pain and myalgias.  Skin:  Negative for rash.  Neurological:  Negative for dizziness, tingling, focal weakness, seizures, weakness and headaches.  Endo/Heme/Allergies:  Does not bruise/bleed easily.  Psychiatric/Behavioral:  Negative for depression and suicidal ideas. The patient does not have insomnia.    Allergies  Allergen Reactions   Other Hives    ABD pad causes rash Pt can only tolerate cloth tape Pt can only tolerate cloth tape   Shellfish Allergy Hives   Tapentadol Hives and Other (See Comments)    Pt can only tolerate cloth tape   Tape Hives and Other (See Comments)    Pt can only tolerate cloth tape   Ginger Hives   Morphine And Related     " I become very angry"   Vancomycin Other (See Comments)    Burns her vein really bad and always causes them to blow.    Patient Active Problem List   Diagnosis Date Noted   Hypotension 07/11/2021   Trichomonas vaginitis 99991111   Acute metabolic encephalopathy 99991111   Bilateral pulmonary embolism ([place]) 07/05/2021   Anxiety disorder 06/28/2021   Prediabetes 05/25/2021   Allergy to adhesive tape 05/25/2021   Hidradenitis suppurativa 05/23/2021   Tachycardia 03/27/2021   Insomnia 03/27/2021   Anemia 12/21/2020   Hypoglycemia 11/17/2020   Bipolar disorder, in full remission, most recent episode mixed ([place]) 10/07/2020   Bipolar disorder, in full remission, most recent episode depressed ([place]) 03/03/2020   Nausea 11/17/2019   Bipolar 2 disorder, major depressive  episode ([place]) 08/04/2019   At risk for long QT syndrome 08/04/2019   Obesity (BMI 35.0-39.9 without comorbidity) 03/19/2019   Reactive airway disease 02/11/2019   Ear itch 11/04/2018   Conductive hearing loss 03/13/2018   Axillary hidradenitis suppurativa 04/03/2017   Vitamin D deficiency 10/18/2014    Attention deficit hyperactivity disorder (ADHD), predominantly hyperactive type 02/11/2012     Past Medical History:  Diagnosis Date   ADHD (attention deficit hyperactivity disorder)    Bipolar disorder ([place])    Chronic tonsillitis 08/2013   current strep, will finish antibiotic 09/04/2013; snores during sleep, mother denies apnea   Family history of adverse reaction to anesthesia    " My cousin had problems waking up. "   Heart murmur    "when I was born; outgrew it; I was a preemie"   Hidradenitis    Hidradenitis 2017   Migraine 12/19/2016   "I've just had this one for 3 days" (12/20/2016)   Septic shock ([place]) 07/02/2021     Past Surgical History:  Procedure Laterality Date   AXILLARY HIDRADENITIS EXCISION     AXILLARY LYMPH NODE BIOPSY Left 01/06/2019   CYSTOSCOPY W/ URETERAL STENT PLACEMENT Right 05/28/2017   Procedure: CYSTOSCOPY WITH RETROGRADE PYELOGRAM/ RIGHT URETERAL STENT PLACEMENT;  Surgeon: Ardis Hughs, MD;  Location: [facility];  Service: Urology;  Laterality: Right;   HYDRADENITIS EXCISION Bilateral 04/03/2017   Procedure: EXCISION AND DRAINAGE BILATERAL  HIDRADENITIS AXILLA;  Surgeon: Jovita Kussmaul, MD;  Location: [place];  Service: General;  Laterality: Bilateral;   INCISION AND DRAINAGE ABSCESS Right 07/20/2016   Procedure: INCISION AND DRAINAGE ABSCESS right axilla;  Surgeon: Coralie Keens, MD;  Location: [facility];  Service: General;  Laterality: Right;   INTRAUTERINE DEVICE (IUD) INSERTION  11/2016   "had the one in my left arm removed"   [place] Right 12/21/2016   Procedure: IRRIGATION AND DEBRIDEMENT RIGHT AXILLARY HIDRADENITIS;  Surgeon: Ileana Roup, MD;  Location: [place];  Service: General;  Laterality: Right;   IRRIGATION AND DEBRIDEMENT ABSCESS Left 01/08/2017   Procedure: IRRIGATION AND DEBRIDEMENT AXILLARY ABSCESS;  Surgeon: Jovita Kussmaul, MD;  Location: [place];  Service: General;  Laterality: Left;   NASAL SEPTOPLASTY  W/ TURBINOPLASTY Bilateral 02/25/2019   Procedure: NASAL SEPTOPLASTY WITH TURBINATE REDUCTION;  Surgeon: Izora Gala, MD;  Location: [place];  Service: ENT;  Laterality: Bilateral;   TONSILLECTOMY Bilateral 09/14/2013   Procedure: BILATERAL TONSILLECTOMY;  Surgeon: Izora Gala, MD;  Location: [place];  Service: ENT;  Laterality: Bilateral;   TYMPANOPLASTY Bilateral    "rebuilt eardrums"   WISDOM TOOTH EXTRACTION      Social History   Socioeconomic History   Marital status: Single    Spouse name: Not on file   Number of children: Not on file   Years of education: EMS/Fire training   Highest education level: Not on file  Occupational History   Not on file  Tobacco Use   Smoking status: Never   Smokeless tobacco: Never  Vaping Use   Vaping Use: Former   Quit date: 07/03/2021   Substances: CBD  Substance and Sexual Activity   Alcohol use: Not Currently   Drug use: Not Currently    Types: Marijuana   Sexual activity: Yes    Birth control/protection: Pill, Condom  Other Topics Concern   Not on file  Social History Narrative   02/11/19   From: the area   Living: with Mom currently,  working to find her own place   Work: EMS/Fire      Family: good relationship with Mom and Royann Shivers      Enjoys: spend time with friends/family, hunting, bomb fire      Exercise: every shift does training - 2-3 times a week   Diet: well rounded, meat heavy      Safety   Seat belts: Yes    Guns: Yes  and secure   Safe in relationships: Yes    Helmet: no    Social Determinants of Radio broadcast assistant Strain: Not on file  Food Insecurity: Not on file  Transportation Needs: Not on file  Physical Activity: Not on file  Stress: Not on file  Social Connections: Not on file  Intimate Partner Violence: Not on file     Family History  Problem Relation Age of Onset   Hypertension Mother    Hyperlipidemia Mother    Diabetes Mother    Hyperlipidemia  Father    Heart attack Father 57   Alcohol abuse Paternal Uncle    Parkinson's disease Maternal Grandmother    Dementia Maternal Grandmother    Diabetes Maternal Grandmother    Bell's palsy Maternal Grandmother      Current Outpatient Medications:    apixaban (ELIQUIS) 5 MG TABS tablet, Take 1 tablet (5 mg total) by mouth 2 (two) times daily., Disp: 60 tablet, Rfl: 0   ARIPiprazole (ABILIFY) 2 MG tablet, Take 1 tablet (2 mg total) by mouth daily., Disp: 90 tablet, Rfl: 1   bifidobacterium infantis (ALIGN) capsule, Take 1 capsule by mouth daily., Disp: , Rfl:    busPIRone (BUSPAR) 10 MG tablet, Take 1 tablet (10 mg total) by mouth 2 (two) times daily., Disp: 60 tablet, Rfl: 1   diazepam (VALIUM) 10 MG tablet, Take 1 tablet 30 minutes prior to dermatology procedure, Disp: , Rfl:    diphenhydrAMINE (BENADRYL) 25 mg capsule, 25 mg every 6 (six) hours as needed., Disp: , Rfl:    EPINEPHrine (EPIPEN 2-PAK) 0.3 mg/0.3 mL IJ SOAJ injection, Inject 0.3 mg into the muscle as needed for anaphylaxis., Disp: 1 each, Rfl: 0   glucose blood (CONTOUR TEST) test strip, And lancets #100, Disp: 100 each, Rfl: 12   inFLIXimab (REMICADE IV), Inject 1 Dose into the vein every 30 (thirty) days., Disp: , Rfl:    Iron-Vitamin C 100-250 MG TABS, Take 1 tablet by mouth daily., Disp: , Rfl:    lamoTRIgine (LAMICTAL) 100 MG tablet, Take 0.5 tablets (50 mg total) by mouth 2 (two) times daily., Disp: 90 tablet, Rfl: 1   methylphenidate (RITALIN) 20 MG tablet, Take 10 mg by mouth daily as needed., Disp: , Rfl:    methylphenidate (RITALIN) 20 MG tablet, Take 1 tablet (20 mg total) by mouth daily., Disp: 30 tablet, Rfl: 0   methylphenidate (RITALIN) 20 MG tablet, Take 1 tablet by mouth daily., Disp: , Rfl:    Microlet Lancets MISC, See admin instructions., Disp: , Rfl:    sulfamethoxazole-trimethoprim (BACTRIM DS) 800-160 MG tablet, Take 1 tablet by mouth 2 (two) times daily., Disp: , Rfl:    traZODone (DESYREL) 50 MG  tablet, TAKE 1 TABLET BY MOUTH EVERYDAY AT BEDTIME, Disp: 90 tablet, Rfl: 0   triamcinolone ointment (KENALOG) 0.1 %, APPLY TO AFFECTED AREA TWICE A DAY, Disp: , Rfl:    hydrOXYzine (ATARAX) 25 MG tablet, TAKE 1 TABLET BY MOUTH EVERY 8 HOURS AS NEEDED FOR ITCHING., Disp: 270 tablet, Rfl: 1  montelukast (SINGULAIR) 10 MG tablet, TAKE 1 TABLET BY MOUTH EVERYDAY AT BEDTIME, Disp: 90 tablet, Rfl: 0   ondansetron (ZOFRAN) 4 MG tablet, Take 1 tablet (4 mg total) by mouth every 6 (six) hours. (Patient not taking: Reported on 07/17/2021), Disp: 12 tablet, Rfl: 0   ondansetron (ZOFRAN-ODT) 4 MG disintegrating tablet, Take by mouth. (Patient not taking: Reported on 07/17/2021), Disp: , Rfl:    Physical exam:  Vitals:   07/17/21 1041  BP: 104/76  Pulse: 92  Resp: 16  Temp: 98.1 F (36.7 C)  TempSrc: Tympanic  Weight: 199 lb 3.2 oz (90.4 kg)  Height: 5\' 1"  (1.549 m)   Physical Exam Constitutional:      General: She is not in acute distress. Cardiovascular:     Rate and Rhythm: Normal rate and regular rhythm.     Heart sounds: Normal heart sounds.  Pulmonary:     Effort: Pulmonary effort is normal.     Breath sounds: Normal breath sounds.  Abdominal:     General: Bowel sounds are normal.     Palpations: Abdomen is soft.  Musculoskeletal:     Right lower leg: No edema.     Left lower leg: No edema.  Skin:    General: Skin is warm and dry.  Neurological:     Mental Status: She is alert and oriented to person, place, and time.          Latest Ref Rng & Units 07/11/2021   12:48 PM  CMP  Glucose 70 - 99 mg/dL 73    BUN 6 - 23 mg/dL 8    Creatinine 1.610.40 - 1.20 mg/dL 0.960.66    Sodium 045135 - 409145 mEq/L 139    Potassium 3.5 - 5.1 mEq/L 4.1    Chloride 96 - 112 mEq/L 104    CO2 19 - 32 mEq/L 27    Calcium 8.4 - 10.5 mg/dL 9.2    Total Protein 6.0 - 8.3 g/dL 7.6    Total Bilirubin 0.2 - 1.2 mg/dL 0.2    Alkaline Phos 39 - 117 U/L 86    AST 0 - 37 U/L 21    ALT 0 - 35 U/L 22         Latest Ref Rng & Units 07/11/2021   12:48 PM  CBC  WBC 4.0 - 10.5 K/uL 9.5    Hemoglobin 12.0 - 15.0 g/dL 81.112.0    Hematocrit 91.436.0 - 46.0 % 37.0    Platelets 150.0 - 400.0 K/uL 340.0      No images are attached to the encounter.  CT Angio Chest PE W and/or Wo Contrast  Result Date: 07/02/2021 CLINICAL DATA:  Altered mental status, lethargy, drowsiness, possible syncope. Evaluate for PE. EXAM: CT ANGIOGRAPHY CHEST CT ABDOMEN AND PELVIS WITH CONTRAST TECHNIQUE: Multidetector CT imaging of the chest was performed using the standard protocol during bolus administration of intravenous contrast. Multiplanar CT image reconstructions and MIPs were obtained to evaluate the vascular anatomy. Multidetector CT imaging of the abdomen and pelvis was performed using the standard protocol during bolus administration of intravenous contrast. RADIATION DOSE REDUCTION: This exam was performed according to the departmental dose-optimization program which includes automated exposure control, adjustment of the mA and/or kV according to patient size and/or use of iterative reconstruction technique. CONTRAST:  100mL OMNIPAQUE IOHEXOL 350 MG/ML SOLN COMPARISON:  CTA chest dated 08/28/2020. CT abdomen/pelvis dated 09/25/2017. FINDINGS: CTA CHEST FINDINGS Cardiovascular: Satisfactory opacification the bilateral pulmonary arteries to the segmental level. Lobar/segmental pulmonary  emboli in the bilateral lower lobes (series 5/images 98, 111, and 136). Overall clot burden is small to moderate. No evidence of right heart strain. No evidence of thoracic aortic aneurysm. The heart is normal in size.  No pericardial effusion. Mediastinum/Nodes: No suspicious mediastinal lymphadenopathy. Visualized thyroid is unremarkable. Lungs/Pleura: Scattered ground-glass opacity/mosaic attenuation in the lungs bilaterally. Mild bibasilar atelectasis. No focal consolidation. No suspicious pulmonary nodules, noting motion degradation. No pleural effusion  or pneumothorax. Musculoskeletal: Visualized osseous structures are within normal limits. Review of the MIP images confirms the above findings. CT ABDOMEN and PELVIS FINDINGS Hepatobiliary: Liver is within normal limits. Gallbladder is unremarkable. No intrahepatic or extrahepatic ductal dilatation. Pancreas: Within normal limits. Spleen: Within normal limits. Adrenals/Urinary Tract: Adrenal glands are within normal limits. Kidneys are within normal limits.  No hydronephrosis. Bladder is within normal limits. Stomach/Bowel: Stomach is within limits. No evidence of bowel obstruction. Normal appendix (series 2/image 31). No colonic wall thickening or inflammatory changes. Vascular/Lymphatic: No evidence of abdominal aortic aneurysm. No suspicious abdominopelvic lymphadenopathy. Reproductive: Uterus is within normal limits. Bilateral ovaries are within normal limits. Other: No abdominopelvic ascites. Musculoskeletal: Visualized osseous structures are within normal limits. Review of the MIP images confirms the above findings. IMPRESSION: Lobar/segmental bilateral lower lobe pulmonary emboli. Overall clot burden is small to moderate. No evidence of right heart strain. Negative CT abdomen/pelvis. Critical Value/emergent results were called by telephone at the time of interpretation on 07/02/2021 at 3:13 am to provider Dr Don Perking, who verbally acknowledged these results. Electronically Signed   By: Charline Bills M.D.   On: 07/02/2021 03:16   CT ABDOMEN PELVIS W CONTRAST  Result Date: 07/02/2021 CLINICAL DATA:  Altered mental status, lethargy, drowsiness, possible syncope. Evaluate for PE. EXAM: CT ANGIOGRAPHY CHEST CT ABDOMEN AND PELVIS WITH CONTRAST TECHNIQUE: Multidetector CT imaging of the chest was performed using the standard protocol during bolus administration of intravenous contrast. Multiplanar CT image reconstructions and MIPs were obtained to evaluate the vascular anatomy. Multidetector CT imaging of the  abdomen and pelvis was performed using the standard protocol during bolus administration of intravenous contrast. RADIATION DOSE REDUCTION: This exam was performed according to the departmental dose-optimization program which includes automated exposure control, adjustment of the mA and/or kV according to patient size and/or use of iterative reconstruction technique. CONTRAST:  OMNIPAQUE IOHEXOL 350 MG/ML SOLN COMPARISON:  CTA chest dated 08/28/2020. CT abdomen/pelvis dated 09/25/2017. FINDINGS: CTA CHEST FINDINGS Cardiovascular: Satisfactory opacification the bilateral pulmonary arteries to the segmental level. Lobar/segmental pulmonary emboli in the bilateral lower lobes (series 5/images 98, 111, and 136). Overall clot burden is small to moderate. No evidence of right heart strain. No evidence of thoracic aortic aneurysm. The heart is normal in size.  No pericardial effusion. Mediastinum/Nodes: No suspicious mediastinal lymphadenopathy. Visualized thyroid is unremarkable. Lungs/Pleura: Scattered ground-glass opacity/mosaic attenuation in the lungs bilaterally. Mild bibasilar atelectasis. No focal consolidation. No suspicious pulmonary nodules, noting motion degradation. No pleural effusion or pneumothorax. Musculoskeletal: Visualized osseous structures are within normal limits. Review of the MIP images confirms the above findings. CT ABDOMEN and PELVIS FINDINGS Hepatobiliary: Liver is within normal limits. Gallbladder is unremarkable. No intrahepatic or extrahepatic ductal dilatation. Pancreas: Within normal limits. Spleen: Within normal limits. Adrenals/Urinary Tract: Adrenal glands are within normal limits. Kidneys are within normal limits.  No hydronephrosis. Bladder is within normal limits. Stomach/Bowel: Stomach is within limits. No evidence of bowel obstruction. Normal appendix (series 2/image 31). No colonic wall thickening or inflammatory changes. Vascular/Lymphatic: No evidence of abdominal aortic  aneurysm. No suspicious abdominopelvic lymphadenopathy. Reproductive: Uterus is within normal limits. Bilateral ovaries are within normal limits. Other: No abdominopelvic ascites. Musculoskeletal: Visualized osseous structures are within normal limits. Review of the MIP images confirms the above findings. IMPRESSION: Lobar/segmental bilateral lower lobe pulmonary emboli. Overall clot burden is small to moderate. No evidence of right heart strain. Negative CT abdomen/pelvis. Critical Value/emergent results were called by telephone at the time of interpretation on 07/02/2021 at 3:13 am to provider Dr Alfred Levins, who verbally acknowledged these results. Electronically Signed   By: Julian Hy M.D.   On: 07/02/2021 03:16   US Venous Img Lower Bilateral (DVT)  Result Date: 07/02/2021 CLINICAL DATA:  Patient with positive pulmonary embolus. EXAM: BILATERAL LOWER EXTREMITY VENOUS DOPPLER ULTRASOUND TECHNIQUE: Gray-scale sonography with compression, as well as color and duplex ultrasound, were performed to evaluate the deep venous system(s) from the level of the common femoral vein through the popliteal and proximal calf veins. COMPARISON:  None. FINDINGS: VENOUS Normal compressibility of the common femoral, superficial femoral, and popliteal veins, as well as the visualized calf veins. Visualized portions of profunda femoral vein and great saphenous vein unremarkable. No filling defects to suggest DVT on grayscale or color Doppler imaging. Doppler waveforms show normal direction of venous flow, normal respiratory plasticity and response to augmentation. Limited views of the contralateral common femoral vein are unremarkable. OTHER None. Limitations: none IMPRESSION: Negative. Electronically Signed   By: Misty Stanley M.D.   On: 07/02/2021 06:36   US Venous Img Upper Uni Right(DVT)  Result Date: 07/04/2021 CLINICAL DATA:  [id number] EXAM: RIGHT UPPER EXTREMITY VENOUS DOPPLER ULTRASOUND TECHNIQUE: Gray-scale sonography  with graded compression, as well as color Doppler and duplex ultrasound were performed to evaluate the upper extremity deep venous system from the level of the subclavian vein and including the jugular, axillary, basilic, radial, ulnar and upper cephalic vein. Spectral Doppler was utilized to evaluate flow at rest and with distal augmentation maneuvers. COMPARISON:  None Available. FINDINGS: Contralateral Subclavian Vein: Respiratory phasicity is normal and symmetric with the symptomatic side. No evidence of thrombus. Normal compressibility. Internal Jugular Vein: No evidence of thrombus. Normal compressibility, respiratory phasicity and response to augmentation. Subclavian Vein: No evidence of thrombus. Normal compressibility, respiratory phasicity and response to augmentation. Axillary Vein: No evidence of thrombus. Normal compressibility, respiratory phasicity and response to augmentation. Cephalic Vein: No evidence of thrombus. Normal compressibility, respiratory phasicity and response to augmentation. Basilic Vein: There is linear echogenic area within the basilic vein at the site of prior PICC insertion, suggestive of chronic DVT. Limited compressibility in this region. Brachial Veins: No evidence of thrombus. Normal compressibility, respiratory phasicity and response to augmentation. Radial Veins: No evidence of thrombus. Normal compressibility, respiratory phasicity and response to augmentation. Ulnar Veins: No evidence of thrombus. Normal compressibility, respiratory phasicity and response to augmentation. IMPRESSION: Linear echogenic area within the basilic vein at the site of prior PICC insertion, suggestive of chronic superficial venous thrombosis and/or scarring. No evidence of deep venous thrombosis. Electronically Signed   By: Margaretha Sheffield M.D.   On: 07/04/2021 12:23   DG Chest Port 1 View  Result Date: 07/01/2021 CLINICAL DATA:  Lethargy EXAM: PORTABLE CHEST 1 VIEW COMPARISON:  08/28/2020  FINDINGS: Low lung volumes. Mild interstitial edema versus vascular crowding. No definite pleural effusions. Mild cardiomegaly, this appearance is aspirated by poor inspiration. Right arm PICC terminates at the cavoatrial junction. IMPRESSION: Low lung volumes. Cardiomegaly with possible mild interstitial edema. No definite pleural effusions. Electronically Signed  By: Charline Bills M.D.   On: 07/01/2021 23:25   ECHOCARDIOGRAM COMPLETE  Result Date: 07/03/2021    ECHOCARDIOGRAM REPORT   Patient Name:   Courtney [name] Date of Exam: 07/03/2021 Medical Rec #:  [id number]      Height:       61.0 in Accession #:    [id number]     Weight:       205.5 lb Date of Birth:  07/23/1990     BSA:          1.911 m Patient Age:    30 years       BP:           91/74 mmHg Patient Gender: F              HR:           85 bpm. Exam Location:  [facility] Procedure: 2D Echo, Cardiac Doppler and Color Doppler Indications:     Pulmonary embolus I26.09  History:         Patient has no prior history of Echocardiogram examinations.                  Signs/Symptoms:Murmur and Migraine.  Sonographer:     Cristela Blue Referring Phys:  [id number] BRITTON L R[place] Diagnosing Phys: Sena Slate  Sonographer Comments: Suboptimal apical window. IMPRESSIONS  1. Left ventricular ejection fraction, by estimation, is 60 to 65%. The left ventricle has normal function. The left ventricle has no regional wall motion abnormalities. Left ventricular diastolic parameters were normal.  2. Right ventricular systolic function is normal. The right ventricular size is mildly enlarged.  3. Possible pericardial vs pleural effusion located posteriorly. No evidence of tamponade. No effusion noted on CT scan from 07/02/21.  4. The mitral valve is normal in structure. No evidence of mitral valve regurgitation. No evidence of mitral stenosis.  5. The aortic valve is normal in structure. Aortic valve regurgitation is not visualized. No aortic stenosis is present.  6. The  inferior vena cava is normal in size with greater than 50% respiratory variability, suggesting right atrial pressure of 3 mmHg. Conclusion(s)/Recommendation(s): Technically difficult study. FINDINGS  Left Ventricle: Left ventricular ejection fraction, by estimation, is 60 to 65%. The left ventricle has normal function. The left ventricle has no regional wall motion abnormalities. The left ventricular internal cavity size was normal in size. There is  no left ventricular hypertrophy. Left ventricular diastolic parameters were normal. Right Ventricle: The right ventricular size is mildly enlarged. No increase in right ventricular wall thickness. Right ventricular systolic function is normal. Left Atrium: Left atrial size was normal in size. Right Atrium: Right atrial size was normal in size. Pericardium: Possible pericardial vs pleural effusion located posteriorly. No evidence of tamponade. No effusion noted on CT scan from 07/02/21. There is no evidence of pericardial effusion. Mitral Valve: The mitral valve is normal in structure. No evidence of mitral valve regurgitation. No evidence of mitral valve stenosis. MV peak gradient, 2.1 mmHg. The mean mitral valve gradient is 1.0 mmHg. Tricuspid Valve: The tricuspid valve is normal in structure. Tricuspid valve regurgitation is not demonstrated. No evidence of tricuspid stenosis. Aortic Valve: The aortic valve is normal in structure. Aortic valve regurgitation is not visualized. No aortic stenosis is present. Aortic valve mean gradient measures 3.0 mmHg. Aortic valve peak gradient measures 5.3 mmHg. Aortic valve area, by VTI measures 3.39 cm. Pulmonic Valve: The pulmonic valve was normal in structure. Pulmonic valve regurgitation is not  visualized. No evidence of pulmonic stenosis. Aorta: The aortic root is normal in size and structure. Venous: The inferior vena cava is normal in size with greater than 50% respiratory variability, suggesting right atrial pressure of 3  mmHg. IAS/Shunts: No atrial level shunt detected by color flow Doppler.  LEFT VENTRICLE PLAX 2D LVIDd:         4.00 cm   Diastology LVIDs:         2.50 cm   LV e' medial:    8.36 cm/s LV PW:         1.20 cm   LV E/e' medial:  9.8 LV IVS:        1.00 cm   LV e' lateral:   16.00 cm/s LVOT diam:     2.10 cm   LV E/e' lateral: 5.1 LV SV:         60 LV SV Index:   31 LVOT Area:     3.46 cm  RIGHT VENTRICLE RV Basal diam:  3.10 cm RV S prime:     17.80 cm/s TAPSE (M-mode): 2.4 cm LEFT ATRIUM           Index        RIGHT ATRIUM           Index LA diam:      2.90 cm 1.52 cm/m   RA Area:     17.80 cm LA Vol (A4C): 44.0 ml 23.03 ml/m  RA Volume:   52.70 ml  27.58 ml/m  AORTIC VALVE                    PULMONIC VALVE AV Area (Vmax):    3.28 cm     PV Vmax:        0.77 m/s AV Area (Vmean):   2.80 cm     PV Vmean:       47.500 cm/s AV Area (VTI):     3.39 cm     PV VTI:         0.098 m AV Vmax:           115.00 cm/s  PV Peak grad:   2.4 mmHg AV Vmean:          79.400 cm/s  PV Mean grad:   1.5 mmHg AV VTI:            0.177 m      RVOT Peak grad: 4 mmHg AV Peak Grad:      5.3 mmHg AV Mean Grad:      3.0 mmHg LVOT Vmax:         109.00 cm/s LVOT Vmean:        64.300 cm/s LVOT VTI:          0.173 m LVOT/AV VTI ratio: 0.98  AORTA Ao Root diam: 3.20 cm MITRAL VALVE               TRICUSPID VALVE MV Area (PHT): 4.19 cm    TR Peak grad:   10.5 mmHg MV Area VTI:   3.79 cm    TR Vmax:        162.00 cm/s MV Peak grad:  2.1 mmHg MV Mean grad:  1.0 mmHg    SHUNTS MV Vmax:       0.72 m/s    Systemic VTI:  0.17 m MV Vmean:      58.1 cm/s   Systemic Diam: 2.10 cm MV Decel Time: 181 msec    Pulmonic  VTI:  0.160 m MV E velocity: 82.30 cm/s MV A velocity: 62.10 cm/s MV E/A ratio:  1.33 Donnelly Angelica Electronically signed by Donnelly Angelica Signature Date/Time: 07/03/2021/12:45:15 PM    Final     Assessment and plan- Patient is a 31 y.o. female with history of bilateral segmental pulmonary emboli  Pulmonary embolus appears provoked in the setting  of septic shock resulting from hidradenitis suppurative requiring IV antibiotics in the background of oral contraceptive use.  I therefore do not recommend any anticoagulation beyond 6 months.  I also do not recommend any hypercoagulable work-up at this time.  Patient will take Eliquis up until end of October 2023 which would be 6 months from the diagnosis of her PE.  Her bilateral lower extremity Doppler as well as right upper extremity Doppler was negative for DVT and I therefore do not think that the PICC line precipitated the clot in any way but certainly the acute infection could have contributed.  Oral contraceptive pills also increase the risk of thromboembolic episodes.  Although the patient has been on birth control for 10 years now, I would still recommend that she should avoid estrogen progesterone based combined contraceptive pills.  Progesterone-based oral contraceptive pills have lesser risk of thrombosis as well as IUDs if that would be an option for her.  She will need to discuss this further with GYN but I would definitely recommend avoiding estrogen-based contraception.  She will have to see hematology in the future whenever she plans pregnancy as she may require anticoagulation given estrogen related blood clot.  She does not require any follow-up with me at this time.  If she were to have any recurrent episodes of DVT or PE she will need to remain on lifelong anticoagulation   Thank you for this kind referral and the opportunity to participate in the care of this  Patient   Visit Diagnosis 1. Bilateral pulmonary embolism (HCC)     Dr. Randa Evens, MD, MPH [facility] [id number] 07/20/2021

## 2021-07-20 NOTE — Progress Notes (Signed)
Cardiology Office Note:    Date:  07/20/2021   ID:  Courtney [name], DOB [birth year], MRN [id number]  PCP:  Courtney Child, MD   [facility] Providers Cardiologist:  None     Referring MD: Courtney Child, MD   Chief Complaint  Patient presents with   New Patient (Initial Visit)    Ref by Dr. Selena [name] for hypotension and positional tacycardia   Courtney [name] is a 31 y.o. female who is being seen today for the evaluation of hypotension at the request of Courtney Child, MD.   History of Present Illness:    Courtney [name] is a 31 y.o. female with a hx of ADHD, bipolar disorder, PE, right basilic vein DVT, who presents due to low blood pressures and tachycardia.  Patient admitted to the hospital last month due to sepsis.  Treated with IV antibiotics.  While in the hospital, she states her heart rates were elevated up to 140s.  Her heart rates are usually on the high normal side at baseline in the 90s.  States feeling okay since discharge, denies dizziness, presyncope or syncope.  Denies palpitations.  Followed up with primary care provider about 2 weeks ago when blood pressures were noted to be low during clinic visit, value 80/60.  She denies any history of heart disease, engages in vaping.  Echocardiogram 07/03/2021 shows normal systolic and diastolic function, EF 60 to 65%  Past Medical History:  Diagnosis Date   ADHD (attention deficit hyperactivity disorder)    Bipolar disorder (HCC)    Chronic tonsillitis 08/2013   current strep, will finish antibiotic 09/04/2013; snores during sleep, mother denies apnea   Family history of adverse reaction to anesthesia    " My cousin had problems waking up. "   Heart murmur    "when I was born; outgrew it; I was a preemie"   Hidradenitis    Hidradenitis 2017   Migraine 12/19/2016   "I've just had this one for 3 days" (12/20/2016)   Septic shock (HCC) 07/02/2021    Past Surgical History:  Procedure Laterality Date   AXILLARY  HIDRADENITIS EXCISION     AXILLARY LYMPH NODE BIOPSY Left 01/06/2019   CYSTOSCOPY W/ URETERAL STENT PLACEMENT Right 05/28/2017   Procedure: CYSTOSCOPY WITH RETROGRADE PYELOGRAM/ RIGHT URETERAL STENT PLACEMENT;  Surgeon: Crist Fat, MD;  Location: [facility];  Service: Urology;  Laterality: Right;   HYDRADENITIS EXCISION Bilateral 04/03/2017   Procedure: EXCISION AND DRAINAGE BILATERAL  HIDRADENITIS AXILLA;  Surgeon: Griselda Miner, MD;  Location: [facility] OR;  Service: General;  Laterality: Bilateral;   INCISION AND DRAINAGE ABSCESS Right 07/20/2016   Procedure: INCISION AND DRAINAGE ABSCESS right axilla;  Surgeon: Abigail Miyamoto, MD;  [facility];  Service: General;  Laterality: Right;   INTRAUTERINE DEVICE (IUD) INSERTION  11/2016   "had the one in my left arm removed"   IRRIGATION AND DEBRIDEMENT ABSCESS Right 12/21/2016   Procedure: IRRIGATION AND DEBRIDEMENT RIGHT AXILLARY HIDRADENITIS;  Surgeon: Andria Meuse, MD;  Location: [facility] OR;  Service: General;  Laterality: Right;   IRRIGATION AND DEBRIDEMENT ABSCESS Left 01/08/2017   Procedure: IRRIGATION AND DEBRIDEMENT AXILLARY ABSCESS;  Surgeon: Griselda Miner, MD;  Location: [facility] OR;  Service: General;  Laterality: Left;   NASAL SEPTOPLASTY W/ TURBINOPLASTY Bilateral 02/25/2019   Procedure: NASAL SEPTOPLASTY WITH TURBINATE REDUCTION;  Surgeon: Serena Colonel, MD;  Location: [facility];  Service: ENT;  Laterality: Bilateral;   TONSILLECTOMY Bilateral 09/14/2013  Procedure: BILATERAL TONSILLECTOMY;  Surgeon: Serena ColonelJefry Rosen, MD;  Location: [facility];  Service: ENT;  Laterality: Bilateral;   TYMPANOPLASTY Bilateral    "rebuilt eardrums"   WISDOM TOOTH EXTRACTION      Current Medications: Current Meds  Medication Sig   apixaban (ELIQUIS) 5 MG TABS tablet Take 1 tablet (5 mg total) by mouth 2 (two) times daily.   ARIPiprazole (ABILIFY) 2 MG tablet Take 1 tablet (2 mg total) by mouth daily.   busPIRone (BUSPAR)  10 MG tablet Take 1 tablet (10 mg total) by mouth 2 (two) times daily.   diazepam (VALIUM) 10 MG tablet Take 1 tablet 30 minutes prior to dermatology procedure   diphenhydrAMINE (BENADRYL) 25 mg capsule 25 mg every 6 (six) hours as needed.   EPINEPHrine (EPIPEN 2-PAK) 0.3 mg/0.3 mL IJ SOAJ injection Inject 0.3 mg into the muscle as needed for anaphylaxis.   glucose blood (CONTOUR TEST) test strip And lancets #100   hydrOXYzine (ATARAX) 25 MG tablet TAKE 1 TABLET BY MOUTH EVERY 8 HOURS AS NEEDED FOR ITCHING.   inFLIXimab (REMICADE IV) Inject 1 Dose into the vein every 30 (thirty) days.   Iron-Vitamin C 100-250 MG TABS Take 1 tablet by mouth daily.   lamoTRIgine (LAMICTAL) 100 MG tablet Take 0.5 tablets (50 mg total) by mouth 2 (two) times daily.   methylphenidate (RITALIN) 20 MG tablet Take 10 mg by mouth daily as needed.   methylphenidate (RITALIN) 20 MG tablet Take 1 tablet (20 mg total) by mouth daily.   methylphenidate (RITALIN) 20 MG tablet Take 1 tablet by mouth daily.   Microlet Lancets MISC See admin instructions.   montelukast (SINGULAIR) 10 MG tablet TAKE 1 TABLET BY MOUTH EVERYDAY AT BEDTIME   norethindrone (MICRONOR) 0.35 MG tablet Take 1 tablet by mouth daily.   ondansetron (ZOFRAN) 4 MG tablet Take 1 tablet (4 mg total) by mouth every 6 (six) hours.   ondansetron (ZOFRAN-ODT) 4 MG disintegrating tablet Take by mouth.   sulfamethoxazole-trimethoprim (BACTRIM DS) 800-160 MG tablet Take 1 tablet by mouth 2 (two) times daily.   traZODone (DESYREL) 50 MG tablet TAKE 1 TABLET BY MOUTH EVERYDAY AT BEDTIME     Allergies:   Other, Shellfish allergy, Tapentadol, Tape, Ginger, Morphine and related, and Vancomycin   Social History   Socioeconomic History   Marital status: Single    Spouse name: Not on file   Number of children: Not on file   Years of education: EMS/Fire training   Highest education level: Not on file  Occupational History   Not on file  Tobacco Use   Smoking  status: Never   Smokeless tobacco: Never  Vaping Use   Vaping Use: Former   Quit date: 07/03/2021   Substances: CBD  Substance and Sexual Activity   Alcohol use: Not Currently   Drug use: Not Currently    Types: Marijuana   Sexual activity: Yes    Birth control/protection: Pill, Condom  Other Topics Concern   Not on file  Social History Narrative   02/11/19   From: the area   Living: with Mom currently, working to find her own place   Work: EMS/Fire      Family: good relationship with Mom and Grandma      Enjoys: spend time with friends/family, hunting, bomb fire      Exercise: every shift does training - 2-3 times a week   Diet: well rounded, meat heavy      Safety  Seat belts: Yes    Guns: Yes  and secure   Safe in relationships: Yes    Helmet: no    Social Determinants of Corporate investment banker Strain: Not on file  Food Insecurity: Not on file  Transportation Needs: Not on file  Physical Activity: Not on file  Stress: Not on file  Social Connections: Not on file     Family History: The patient's family history includes Alcohol abuse in her paternal uncle; Bell's palsy in her maternal grandmother; Dementia in her maternal grandmother; Diabetes in her maternal grandmother and mother; Heart attack (age of onset: 34) in her father; Hyperlipidemia in her father and mother; Hypertension in her mother; Parkinson's disease in her maternal grandmother.  ROS:   Please see the history of present illness.     All other systems reviewed and are negative.  EKGs/Labs/Other Studies Reviewed:    The following studies were reviewed today:   EKG:  EKG is  ordered today.  The ekg ordered today demonstrates normal sinus rhythm, heart rate 95  Recent Labs: 07/02/2021: B Natriuretic Peptide 57.6 07/06/2021: Magnesium 2.1 07/11/2021: ALT 22; BUN 8; Creatinine, Ser 0.66; Hemoglobin 12.0; Platelets 340.0; Potassium 4.1; Sodium 139; TSH 1.00  Recent Lipid Panel    Component  Value Date/Time   CHOL 195 07/11/2020 1140   TRIG 224.0 (H) 07/11/2020 1140   HDL 41.80 07/11/2020 1140   CHOLHDL 5 07/11/2020 1140   VLDL 44.8 (H) 07/11/2020 1140   LDLCALC 119 (H) 05/18/2019 1018   LDLDIRECT 130.0 07/11/2020 1140     Risk Assessment/Calculations:          Physical Exam:    VS:  BP 98/62 (BP Location: Left Arm, Patient Position: Sitting, Cuff Size: Large)   Pulse 95   Ht  (1.549 m)   Wt 201 lb (91.2 kg)   LMP  (LMP Unknown)   SpO2 97%   BMI 37.98 kg/m     Wt Readings from Last 3 Encounters:  07/20/21 201 lb (91.2 kg)  07/17/21 199 lb 3.2 oz (90.4 kg)  07/11/21 205 lb 2 oz (93 kg)     GEN:  Well nourished, well developed in no acute distress HEENT: Normal NECK: No JVD; No carotid bruits CARDIAC: RRR, no murmurs, rubs, gallops RESPIRATORY:  Clear to auscultation without rales, wheezing or rhonchi  ABDOMEN: Soft, non-tender, non-distended MUSCULOSKELETAL:  No edema; No deformity  SKIN: Warm and dry NEUROLOGIC:  Alert and oriented x 3 PSYCHIATRIC:  Normal affect   ASSESSMENT:    1. Hypotension, unspecified hypotension type   2. Tachycardia   3. Engages in vaping   4. BMI 37.0-37.9, adult    PLAN:    In order of problems listed above:  Hypotension, blood pressure today much improved, although low normal.  Adequate hydration advised.  She is on several medications with side effects of hypertension including tramadol, Abilify, Valium.  Denies any symptoms of dizziness or syncope/presyncope.  No indication for medications at this time.   Tachycardia, in the context of sepsis.  Baseline heart rates high normal in the 90s.  ADHD medications such as methylphenidate likely contributing.  Patient clinically asymptomatic, no indication for AV nodal agents.  Patient reassured. Engages in vaping, cessation advised. Obesity, low-calorie diet, weight loss advised.  Follow-up as needed       Medication Adjustments/Labs and Tests Ordered: Current  medicines are reviewed at length with the patient today.  Concerns regarding medicines are outlined above.  Orders  Placed This Encounter  Procedures   EKG 12-Lead   No orders of the defined types were placed in this encounter.   Patient Instructions  Medication Instructions:  Your physician recommends that you continue on your current medications as directed. Please refer to the Current Medication list given to you today.  *If you need a refill on your cardiac medications before your next appointment, please call your pharmacy*   Lab Work: None ordered If you have labs (blood work) drawn today and your tests are completely normal, you will receive your results only by: MyChart Message (if you have MyChart) OR A paper copy in the mail If you have any lab test that is abnormal or we need to change your treatment, we will call you to review the results.   Testing/Procedures: None ordered   Follow-Up: At [facility], you and your health needs are our priority.  As part of our continuing mission to provide you with exceptional heart care, we have created designated Provider Care Teams.  These Care Teams include your primary Cardiologist (physician) and Advanced Practice Providers (APPs -  Physician Assistants and Nurse Practitioners) who all work together to provide you with the care you need, when you need it.  We recommend signing up for the patient portal called "MyChart".  Sign up information is provided on this After Visit Summary.  MyChart is used to connect with patients for Virtual Visits (Telemedicine).  Patients are able to view lab/test results, encounter notes, upcoming appointments, etc.  Non-urgent messages can be sent to your provider as well.   To learn more about what you can do with MyChart, go to [email or web address].    Your next appointment:   Follow up as needed   The format for your next appointment:   In Person  Provider:   You may see Courtney Odea, MD or one of the following Advanced Practice Providers on your designated Care Team:   Nicolasa Ducking, NP Eula Listen, PA-C Cadence Fransico Michael, [place]    Other Instructions   Important Information About Sugar         Signed, Courtney Odea, MD  07/20/2021 12:22 PM    [facility]

## 2021-07-26 ENCOUNTER — Telehealth: Payer: Self-pay | Admitting: Psychiatry

## 2021-07-26 NOTE — Telephone Encounter (Signed)
Faxed this morning.

## 2021-07-26 NOTE — Telephone Encounter (Signed)
Received carry concealed handgun permit medical information request from office of [facility]- Completed the form. Will have Shanda Bumps CMA fax it.

## 2021-07-27 ENCOUNTER — Ambulatory Visit: Payer: BC Managed Care – PPO | Admitting: Psychiatry

## 2021-08-02 ENCOUNTER — Telehealth: Payer: BC Managed Care – PPO | Admitting: Oncology

## 2021-08-02 ENCOUNTER — Other Ambulatory Visit: Payer: Self-pay

## 2021-08-02 ENCOUNTER — Ambulatory Visit (INDEPENDENT_AMBULATORY_CARE_PROVIDER_SITE_OTHER): Payer: BC Managed Care – PPO | Admitting: Nurse Practitioner

## 2021-08-02 VITALS — BP 90/64 | HR 105 | Temp 97.9°F | Resp 16 | Ht 61.0 in | Wt 201.4 lb

## 2021-08-02 DIAGNOSIS — J0110 Acute frontal sinusitis, unspecified: Secondary | ICD-10-CM | POA: Diagnosis not present

## 2021-08-02 MED ORDER — AMOXICILLIN-POT CLAVULANATE 875-125 MG PO TABS: 1.0000 | ORAL_TABLET | Freq: Two times a day (BID) | ORAL | 0 refills | Status: DC

## 2021-08-02 NOTE — Progress Notes (Signed)
Acute Office Visit  Subjective:     Patient ID: Courtney [name], female    DOB: [birth year], 31 y.o.   MRN: [id number]  Chief Complaint  Patient presents with   Nasal Congestion    Started several weeks ago and is getting worse now. Headache, sinus pressure/pain, sore throat, post nasal drip., cough. No fever. Has taking OTC Tylenol severe sinus. No covid test done.    HPI Patient is in today for sinus issues  Symptoms started approx 2 weeks ago States that it got worse over night last night. No sick contacts Pfizer vaccine x2 and one booster No covid test Has been taking tylenol severe sinus that made the symptoms worse.  Review of Systems  Constitutional:  Positive for malaise/fatigue. Negative for chills and fever.  HENT:  Negative for congestion, ear discharge, ear pain, sinus pain and sore throat.   Respiratory:  Positive for cough (non productive). Negative for shortness of breath.   Cardiovascular:  Negative for chest pain.  Gastrointestinal:  Negative for diarrhea, nausea and vomiting.  Musculoskeletal:  Negative for joint pain and myalgias.  Neurological:  Negative for dizziness and headaches.       Objective:    BP 90/64   Pulse (!) 105   Temp 97.9 F (36.6 C)   Resp 16   Ht 5\' 1"  (1.549 m)   Wt 201 lb 6 oz (91.3 kg)   SpO2 97%   BMI 38.05 kg/m    Physical Exam Vitals and nursing note reviewed.  Constitutional:      Appearance: Normal appearance. She is obese.  HENT:     Right Ear: Tympanic membrane, ear canal and external ear normal.     Left Ear: Tympanic membrane, ear canal and external ear normal.     Nose:     Right Sinus: Frontal sinus tenderness present. No maxillary sinus tenderness.     Left Sinus: Maxillary sinus tenderness and frontal sinus tenderness present.     Mouth/Throat:     Mouth: Mucous membranes are moist.     Pharynx: Oropharynx is clear.  Cardiovascular:     Rate and Rhythm: Normal rate and regular rhythm.     Heart  sounds: Normal heart sounds.  Pulmonary:     Effort: Pulmonary effort is normal.     Breath sounds: Normal breath sounds.  Neurological:     Mental Status: She is alert.    No results found for any visits on 08/02/21.      Assessment & Plan:   Problem List Items Addressed This Visit       Respiratory   Acute non-recurrent frontal sinusitis - Primary    Signs symptoms consistent with acute frontal sinusitis.  We will treat patient with Augmentin 875-125 mg twice daily for 7 days.  Patient was instructed by ENT not to use Flonase.  She is also taking 2 antihistamines currently Benadryl and hydroxyzine.  She can use Ocean Spray nasal saline as needed and do sinus rinses.       Relevant Medications   amoxicillin-clavulanate (AUGMENTIN) 875-125 MG tablet    Meds ordered this encounter  Medications   amoxicillin-clavulanate (AUGMENTIN) 875-125 MG tablet    Sig: Take 1 tablet by mouth 2 (two) times daily for 7 days.    Dispense:  14 tablet    Refill:  0    Order Specific Question:   Supervising Provider    Answer:   Loura Pardon A [[id number]]    Return  if symptoms worsen or fail to improve.  Romilda Garret, NP

## 2021-08-02 NOTE — Telephone Encounter (Signed)
Patient asking for refill on Eliquis. Patient stated Dr Selena Batten was going to take over refills. Last filled on 07/11/21 #60 by Marrion Coy, MD hospital provider

## 2021-08-02 NOTE — Assessment & Plan Note (Signed)
Signs symptoms consistent with acute frontal sinusitis.  We will treat patient with Augmentin 875-125 mg twice daily for 7 days.  Patient was instructed by ENT not to use Flonase.  She is also taking 2 antihistamines currently Benadryl and hydroxyzine.  She can use Ocean Spray nasal saline as needed and do sinus rinses.

## 2021-08-02 NOTE — Patient Instructions (Signed)
Nice to see you today I sent an antibiotic to your pharmacy of choice Follow up if you do not improve or symptoms get worse.

## 2021-08-03 MED ORDER — APIXABAN 5 MG PO TABS: 5.0000 mg | ORAL_TABLET | Freq: Two times a day (BID) | ORAL | 0 refills | Status: DC

## 2021-08-08 ENCOUNTER — Ambulatory Visit (INDEPENDENT_AMBULATORY_CARE_PROVIDER_SITE_OTHER): Payer: BC Managed Care – PPO | Admitting: Psychiatry

## 2021-08-08 ENCOUNTER — Other Ambulatory Visit (HOSPITAL_COMMUNITY): Payer: Self-pay

## 2021-08-08 ENCOUNTER — Encounter: Payer: Self-pay | Admitting: Psychiatry

## 2021-08-08 ENCOUNTER — Encounter: Payer: Self-pay | Admitting: Family Medicine

## 2021-08-08 VITALS — BP 118/87 | HR 112 | Temp 98.3°F | Wt 199.8 lb

## 2021-08-08 DIAGNOSIS — L732 Hidradenitis suppurativa: Principal | ICD-10-CM

## 2021-08-08 DIAGNOSIS — G8918 Other acute postprocedural pain: Principal | ICD-10-CM

## 2021-08-08 DIAGNOSIS — F3177 Bipolar disorder, in partial remission, most recent episode mixed: Secondary | ICD-10-CM

## 2021-08-08 DIAGNOSIS — F418 Other specified anxiety disorders: Secondary | ICD-10-CM | POA: Diagnosis not present

## 2021-08-08 DIAGNOSIS — G4701 Insomnia due to medical condition: Secondary | ICD-10-CM

## 2021-08-08 DIAGNOSIS — F901 Attention-deficit hyperactivity disorder, predominantly hyperactive type: Secondary | ICD-10-CM

## 2021-08-08 MED ORDER — TRAMADOL HCL 50 MG PO TABS
ORAL_TABLET | ORAL | 0 refills | Status: DC
  Filled 2021-08-08: qty 15, 3d supply, fill #0

## 2021-08-08 MED ORDER — BUSPIRONE HCL 10 MG PO TABS: 10.0000 mg | ORAL_TABLET | Freq: Two times a day (BID) | ORAL | 0 refills | Status: DC

## 2021-08-08 MED ORDER — TRAMADOL 50 MG TABLET
ORAL_TABLET | Freq: Four times a day (QID) | ORAL | 0 refills | 4 days | Status: CP | PRN
Start: 2021-08-08 — End: ?

## 2021-08-08 NOTE — Progress Notes (Signed)
BH MD OP Progress Note  08/08/2021 11:16 AM Courtney [name]  MRN:  [id number]  Chief Complaint:  Chief Complaint  Patient presents with   Follow-up: 31 year old female with history of bipolar disorder type II, ADHD, presented for medication management after recent admission to medical services for septic shock.   HPI: Courtney [name] is a 31 year old Caucasian female, single, lives in [place], has a history of bipolar disorder type II, ADHD, migraine headaches, hidradenitis was evaluated in office today.  Patient status post medical service admission-07/01/2021 - 07/06/2021-for septic shock.  Patient today reports since being released from the hospital she has been improving.  She however reports she continues to worry and feels sad due to her job loss.  Patient reports she was terminated from her job recently.  Patient reports she has applied for another job and has upcoming interview coming up.  She looks forward to that.  Patient reports sleep is overall okay.  She reports appetite is fair.  Does struggle with her energy level, since she is recovering from her recent health problems.  However it is getting better day-by-day.  Currently compliant on medications.  Denies side effects.  Denies any suicidality, homicidality or perceptual disturbances.  Patient denies any other concerns today.  Visit Diagnosis:    ICD-10-CM   1. Bipolar disorder, in partial remission, most recent episode mixed (HCC)  F31.77    mild mixed features, type 2    2. Insomnia due to medical condition  G47.01    mood    3. Other specified anxiety disorders  F41.8 busPIRone (BUSPAR) 10 MG tablet   generalized anxiety not occurring more days than not    4. Attention deficit hyperactivity disorder (ADHD), predominantly hyperactive type  F90.1       Past Psychiatric History: Reviewed past psychiatric history from progress note on 08/04/2019.  Past trials of Ritalin, Daytrana patch, Adderall, risperidone,  Concerta.  Past Medical History:  Past Medical History:  Diagnosis Date   ADHD (attention deficit hyperactivity disorder)    Bipolar disorder (HCC)    Chronic tonsillitis 08/2013   current strep, will finish antibiotic 09/04/2013; snores during sleep, mother denies apnea   Family history of adverse reaction to anesthesia    " My cousin had problems waking up. "   Heart murmur    "when I was born; outgrew it; I was a preemie"   Hidradenitis    Hidradenitis 2017   Migraine 12/19/2016   "I've just had this one for 3 days" (12/20/2016)   Septic shock (HCC) 07/02/2021    Past Surgical History:  Procedure Laterality Date   AXILLARY HIDRADENITIS EXCISION     AXILLARY LYMPH NODE BIOPSY Left 01/06/2019   CYSTOSCOPY W/ URETERAL STENT PLACEMENT Right 05/28/2017   Procedure: CYSTOSCOPY WITH RETROGRADE PYELOGRAM/ RIGHT URETERAL STENT PLACEMENT;  Surgeon: Crist Fat, MD;  Location: [facility];  Service: Urology;  Laterality: Right;   HYDRADENITIS EXCISION Bilateral 04/03/2017   Procedure: EXCISION AND DRAINAGE BILATERAL  HIDRADENITIS AXILLA;  Surgeon: Griselda Miner, MD;  Location: [facility] OR;  Service: General;  Laterality: Bilateral;   INCISION AND DRAINAGE ABSCESS Right 07/20/2016   Procedure: INCISION AND DRAINAGE ABSCESS right axilla;  Surgeon: Abigail Miyamoto, MD;  Location: [facility] OR;  Service: General;  Laterality: Right;   INTRAUTERINE DEVICE (IUD) INSERTION  11/2016   "had the one in my left arm removed"   IRRIGATION AND DEBRIDEMENT ABSCESS Right 12/21/2016   Procedure: IRRIGATION AND DEBRIDEMENT RIGHT AXILLARY HIDRADENITIS;  Surgeon: Andria MeuseWhite, Christopher M, MD;  [facility];  Service: General;  Laterality: Right;   IRRIGATION AND DEBRIDEMENT ABSCESS Left 01/08/2017   Procedure: IRRIGATION AND DEBRIDEMENT AXILLARY ABSCESS;  Surgeon: Griselda Mineroth, Paul III, MD;  [facility];  Service: General;  Laterality: Left;   NASAL SEPTOPLASTY W/ TURBINOPLASTY Bilateral 02/25/2019   Procedure: NASAL  SEPTOPLASTY WITH TURBINATE REDUCTION;  Surgeon: Serena Colonelosen, Jefry, MD;  Location: [facility];  Service: ENT;  Laterality: Bilateral;   TONSILLECTOMY Bilateral 09/14/2013   Procedure: BILATERAL TONSILLECTOMY;  Surgeon: Serena ColonelJefry Rosen, MD;  Location: [facility];  Service: ENT;  Laterality: Bilateral;   TYMPANOPLASTY Bilateral    "rebuilt eardrums"   WISDOM TOOTH EXTRACTION      Family Psychiatric History: Reviewed family psychiatric history from progress note on 08/04/2019.  Family History:  Family History  Problem Relation Age of Onset   Hypertension Mother    Hyperlipidemia Mother    Diabetes Mother    Hyperlipidemia Father    Heart attack Father 1650   Alcohol abuse Paternal Uncle    Parkinson's disease Maternal Grandmother    Dementia Maternal Grandmother    Diabetes Maternal Grandmother    Bell's palsy Maternal Grandmother     Social History: Reviewed social history from progress note on 08/04/2019. Social History   Socioeconomic History   Marital status: Single    Spouse name: Not on file   Number of children: Not on file   Years of education: EMS/Fire training   Highest education level: Not on file  Occupational History   Not on file  Tobacco Use   Smoking status: Never   Smokeless tobacco: Never  Vaping Use   Vaping Use: Former   Quit date: 07/03/2021   Substances: CBD  Substance and Sexual Activity   Alcohol use: Not Currently   Drug use: Not Currently    Types: Marijuana   Sexual activity: Yes    Birth control/protection: Pill, Condom  Other Topics Concern   Not on file  Social History Narrative   02/11/19   From: the area   Living: with Mom currently, working to find her own place   Work: EMS/Fire      Family: good relationship with Mom and Grandma      Enjoys: spend time with friends/family, hunting, bomb fire      Exercise: every shift does training - 2-3 times a week   Diet: well rounded, meat heavy      Safety   Seat belts:  Yes    Guns: Yes  and secure   Safe in relationships: Yes    Helmet: no    Social Determinants of Corporate investment bankerHealth   Financial Resource Strain: Not on file  Food Insecurity: Not on file  Transportation Needs: Not on file  Physical Activity: Not on file  Stress: Not on file  Social Connections: Not on file    Allergies:  Allergies  Allergen Reactions   Other Hives    ABD pad causes rash Pt can only tolerate cloth tape Pt can only tolerate cloth tape   Shellfish Allergy Hives   Tapentadol Hives and Other (See Comments)    Pt can only tolerate cloth tape   Tape Hives and Other (See Comments)    Pt can only tolerate cloth tape   Ginger Hives   Morphine And Related     " I become very angry"   Vancomycin Other (See Comments)    Burns her vein really bad and  always causes them to blow.    Metabolic Disorder Labs: Lab Results  Component Value Date   HGBA1C 5.6 05/18/2021   No results found for: PROLACTIN Lab Results  Component Value Date   CHOL 195 07/11/2020   TRIG 224.0 (H) 07/11/2020   HDL 41.80 07/11/2020   CHOLHDL 5 07/11/2020   VLDL 44.8 (H) 07/11/2020   LDLCALC 119 (H) 05/18/2019   LDLCALC 116 (H) 12/03/2016   Lab Results  Component Value Date   TSH 1.00 07/11/2021   TSH 0.62 02/14/2021    Therapeutic Level Labs: No results found for: LITHIUM No results found for: VALPROATE No components found for:  CBMZ  Current Medications: Current Outpatient Medications  Medication Sig Dispense Refill   apixaban (ELIQUIS) 5 MG TABS tablet Take 1 tablet (5 mg total) by mouth 2 (two) times daily. 180 tablet 0   ARIPiprazole (ABILIFY) 2 MG tablet Take 1 tablet (2 mg total) by mouth daily. 90 tablet 1   bifidobacterium infantis (ALIGN) capsule Take 1 capsule by mouth daily.     diazepam (VALIUM) 10 MG tablet Take 1 tablet 30 minutes prior to dermatology procedure     diphenhydrAMINE (BENADRYL) 25 mg capsule 25 mg every 6 (six) hours as needed.     EPINEPHrine (EPIPEN 2-PAK)  0.3 mg/0.3 mL IJ SOAJ injection Inject 0.3 mg into the muscle as needed for anaphylaxis. 1 each 0   glucose blood (CONTOUR TEST) test strip And lancets #100 100 each 12   hydrOXYzine (ATARAX) 25 MG tablet TAKE 1 TABLET BY MOUTH EVERY 8 HOURS AS NEEDED FOR ITCHING. 270 tablet 1   inFLIXimab (REMICADE IV) Inject 1 Dose into the vein every 30 (thirty) days.     Iron-Vitamin C 100-250 MG TABS Take 1 tablet by mouth daily.     lamoTRIgine (LAMICTAL) 100 MG tablet Take 0.5 tablets (50 mg total) by mouth 2 (two) times daily. 90 tablet 1   methylphenidate (RITALIN) 20 MG tablet Take 10 mg by mouth daily as needed.     Microlet Lancets MISC See admin instructions.     montelukast (SINGULAIR) 10 MG tablet TAKE 1 TABLET BY MOUTH EVERYDAY AT BEDTIME 90 tablet 0   norethindrone (MICRONOR) 0.35 MG tablet Take 1 tablet by mouth daily.     traZODone (DESYREL) 50 MG tablet TAKE 1 TABLET BY MOUTH EVERYDAY AT BEDTIME 90 tablet 0   busPIRone (BUSPAR) 10 MG tablet Take 1 tablet (10 mg total) by mouth 2 (two) times daily. 180 tablet 0   No current facility-administered medications for this visit.     Musculoskeletal: Strength & Muscle Tone: within normal limits Gait & Station: normal Patient leans: N/A  Psychiatric Specialty Exam: Review of Systems  Constitutional:  Positive for fatigue.  Psychiatric/Behavioral:  Positive for dysphoric mood and sleep disturbance. The patient is nervous/anxious.   All other systems reviewed and are negative.  Blood pressure 118/87, pulse (!) 112, temperature 98.3 F (36.8 C), temperature source Temporal, weight 199 lb 12.8 oz (90.6 kg).Body mass index is 37.75 kg/m.  General Appearance: Casual  Eye Contact:  Fair  Speech:  Clear and Coherent  Volume:  Normal  Mood:  Anxious and Depressed improving  Affect:  Appropriate  Thought Process:  Goal Directed and Descriptions of Associations: Intact  Orientation:  Full (Time, Place, and Person)  Thought Content: Logical    Suicidal Thoughts:  No  Homicidal Thoughts:  No  Memory:  Immediate;   Fair Recent;   Fair Remote;  Fair  Judgement:  Fair  Insight:  Fair  Psychomotor Activity:  Normal  Concentration:  Concentration: Fair and Attention Span: Fair  Recall:  [employer] of Knowledge: Fair  Language: Fair  Akathisia:  No  Handed:  Right  AIMS (if indicated): done  Assets:  Communication Skills Desire for Improvement Housing Social Support  ADL's:  Intact  Cognition: WNL  Sleep:   improving   Screenings: AIMS    Flowsheet Row Office Visit from 08/08/2021 in [facility] Office Visit from 06/28/2021 in [facility]  AIMS Total Score 0 0      GAD-7    Flowsheet Row Office Visit from 03/27/2021 in [facility] Video Visit from 10/26/2020 in [facility] Office Visit from 10/07/2020 in [facility] Video Visit from 06/22/2020 in [facility] Office Visit from 01/20/2019 in [facility]  Total GAD-7 Score 3 3 10 4 3       PHQ2-9    Flowsheet Row Office Visit from 08/08/2021 in [facility] Office Visit from 06/28/2021 in [facility] Office Visit from 03/27/2021 in [facility] Video Visit from 10/26/2020 in [facility] Office Visit from 10/07/2020 in [facility]  PHQ-2 Total Score 4 4 4 1 2   PHQ-9 Total Score 10 11 14 4 9       Flowsheet Row Office Visit from 08/08/2021 in [facility] ED to Hosp-Admission (Discharged) from 07/01/2021 in [facility] Office Visit from 06/28/2021 in [facility]  C-SSRS RISK CATEGORY No Risk No Risk No Risk        Assessment and Plan: Courtney [name] is a 31 year old  Caucasian female, unemployed, lives in [place], has a history of ADHD, migraine headaches, bipolar disorder type II was evaluated in office today.  Patient with recent internal medicine service admission for septic shock, currently recovering.  Patient with psychosocial stressors of current health problems, job loss, will benefit from following plan.  Plan Bipolar disorder type II-in partial remission Lamotrigine 100 mg p.o. daily Abilify 2 mg p.o. daily Discussed readjusting the dosage of Abilify-to address her residual symptoms however she is not interested.  She believes her current mood symptoms are likely due to her job loss and is currently awaiting an interview. Trazodone 50 mg p.o. nightly.  Insomnia-improving Continue trazodone 50 mg p.o. nightly  Other specified anxiety disorder-generalized anxiety not occurring more days than not-improving BuSpar 10 mg p.o. twice daily Patient also currently has hydroxyzine available as needed prescribed by dermatology for itching although it helps with anxiety as well Patient was referred for CBT-pending  ADHD-stable Patient to follow-up with her provider who manages it.  I have reviewed notes per Dr.Zhang dated 07/01/2021-hospitalist-patient was admitted for septic shock as noted above.  Reviewed EKG-dated 07/21/2021-normal sinus rhythm-[id number].  Follow-up in clinic in 5 to 6 weeks or sooner if needed.  This note was generated in part or whole with voice recognition software. Voice recognition is usually quite accurate but there are transcription errors that can and very often do occur. I apologize for any typographical errors that were not detected and corrected.    Hardin, MD 08/08/2021, 11:16 AM

## 2021-08-09 DIAGNOSIS — L732 Hidradenitis suppurativa: Principal | ICD-10-CM

## 2021-08-10 ENCOUNTER — Ambulatory Visit: Admit: 2021-08-10 | Discharge: 2021-08-11 | Payer: PRIVATE HEALTH INSURANCE

## 2021-08-10 DIAGNOSIS — L732 Hidradenitis suppurativa: Secondary | ICD-10-CM | POA: Diagnosis not present

## 2021-08-14 ENCOUNTER — Other Ambulatory Visit (HOSPITAL_COMMUNITY): Payer: Self-pay

## 2021-08-14 ENCOUNTER — Ambulatory Visit: Admit: 2021-08-14 | Discharge: 2021-08-15 | Payer: PRIVATE HEALTH INSURANCE

## 2021-08-14 DIAGNOSIS — L732 Hidradenitis suppurativa: Principal | ICD-10-CM

## 2021-08-14 DIAGNOSIS — L408 Other psoriasis: Principal | ICD-10-CM

## 2021-08-14 DIAGNOSIS — Z79899 Other long term (current) drug therapy: Secondary | ICD-10-CM | POA: Diagnosis not present

## 2021-08-14 MED ORDER — SULFAMETHOXAZOLE 800 MG-TRIMETHOPRIM 160 MG TABLET
ORAL_TABLET | Freq: Two times a day (BID) | ORAL | 3 refills | 30.00000 days | Status: CP
Start: 2021-08-14 — End: ?

## 2021-08-14 MED ORDER — COSENTYX PEN 300 MG/2 PENS (150 MG/ML) SUBCUTANEOUS
SUBCUTANEOUS | 11 refills | 0.00000 days | Status: CP
Start: 2021-08-14 — End: ?
  Filled 2021-10-04: qty 8, 28d supply, fill #0

## 2021-08-15 DIAGNOSIS — L408 Other psoriasis: Principal | ICD-10-CM

## 2021-08-25 ENCOUNTER — Ambulatory Visit: Payer: BC Managed Care – PPO | Admitting: Family Medicine

## 2021-08-30 DIAGNOSIS — L732 Hidradenitis suppurativa: Principal | ICD-10-CM

## 2021-08-31 ENCOUNTER — Ambulatory Visit: Payer: BC Managed Care – PPO | Admitting: Psychiatry

## 2021-09-02 DIAGNOSIS — L732 Hidradenitis suppurativa: Principal | ICD-10-CM

## 2021-09-04 ENCOUNTER — Ambulatory Visit: Payer: BC Managed Care – PPO | Admitting: Dietician

## 2021-09-04 ENCOUNTER — Other Ambulatory Visit: Payer: Self-pay | Admitting: Family Medicine

## 2021-09-04 DIAGNOSIS — F901 Attention-deficit hyperactivity disorder, predominantly hyperactive type: Secondary | ICD-10-CM

## 2021-09-04 MED ORDER — METHYLPHENIDATE HCL 20 MG PO TABS: 10.0000 mg | ORAL_TABLET | Freq: Every day | ORAL | 0 refills | Status: DC | PRN

## 2021-09-04 NOTE — Telephone Encounter (Signed)
No suspicious activity noted on PMP aware web site. Last office visit is up-to-date. Refill sent to pharmacy.   

## 2021-09-04 NOTE — Telephone Encounter (Signed)
Caller Name: Manie Bealer (mother) Call back phone #: [phone]  MEDICATION(S): methylphenidate (RITALIN) 20 MG tablet   Days of Med Remaining: 2 days left  Has the patient contacted their pharmacy (YES/NO)?  Yes, controlled med IF YES, when and what did the pharmacy advise?  IF NO, request that the patient contact the pharmacy for the refills in the future.             The pharmacy will send an electronic request (except for controlled medications).  [facility] [address] in [place]  ~~~Please advise patient/caregiver to allow 2-3 business days to process RX refills.

## 2021-09-04 NOTE — Telephone Encounter (Signed)
Last ADHD OV: 05/25/21 Next OV: 09/18/21 Mother states pt has 2 pills left.   Chart shows that rx was last filled by [facility] but Dr. Selena Batten has filled in the past.

## 2021-09-07 ENCOUNTER — Ambulatory Visit: Admit: 2021-09-07 | Discharge: 2021-09-08 | Payer: PRIVATE HEALTH INSURANCE

## 2021-09-07 DIAGNOSIS — L732 Hidradenitis suppurativa: Secondary | ICD-10-CM | POA: Diagnosis not present

## 2021-09-14 ENCOUNTER — Other Ambulatory Visit: Payer: Self-pay | Admitting: Family Medicine

## 2021-09-14 DIAGNOSIS — G4700 Insomnia, unspecified: Secondary | ICD-10-CM

## 2021-09-18 ENCOUNTER — Ambulatory Visit: Payer: BC Managed Care – PPO | Admitting: Family Medicine

## 2021-09-18 ENCOUNTER — Ambulatory Visit: Payer: BC Managed Care – PPO | Admitting: Psychiatry

## 2021-09-25 ENCOUNTER — Telehealth: Payer: Self-pay

## 2021-09-25 DIAGNOSIS — F3177 Bipolar disorder, in partial remission, most recent episode mixed: Secondary | ICD-10-CM

## 2021-09-25 MED ORDER — ARIPIPRAZOLE 2 MG PO TABS: 2.0000 mg | ORAL_TABLET | Freq: Every day | ORAL | 1 refills | Status: DC

## 2021-09-25 NOTE — Telephone Encounter (Signed)
Received fax requesting a refill on the aripiprazole 2mg .  Pt was last seen on 08-08-21 and next appt 8-23    ARIPiprazole (ABILIFY) 2 MG tablet Medication Date: 03/27/2021 Department: [facility] Ordering/Authorizing: [facility], MD   Order Providers  Prescribing Provider Encounter Provider  Jomarie Longs, MD Jomarie Longs, MD   Outpatient Medication Detail   Disp Refills Start End   ARIPiprazole (ABILIFY) 2 MG tablet 90 tablet 1 03/27/2021    Sig - Route: Take 1 tablet (2 mg total) by mouth daily. - Oral   Sent to pharmacy as: ARIPiprazole (ABILIFY) 2 MG tablet   E-Prescribing Status: Receipt confirmed by pharmacy (03/27/2021 11:23 AM EST)

## 2021-09-25 NOTE — Telephone Encounter (Signed)
I have sent Abilify 2 mg to [facility].

## 2021-09-25 NOTE — Unmapped (Signed)
[facility] Specialty Medication Onboarding    Specialty Medication: COSENTYX PEN (2 PENS) 150 mg/mL Pnij injection (secukinumab)  Prior Authorization: Approved   Financial Assistance: No - copay  <$25  Final Copay/Day Supply: $0 / 30 (loading)          $0 / 28 (maintenance)    Insurance Restrictions: Yes - max 1 month supply     Notes to Pharmacist: n/a    The triage team has completed the benefits investigation and has determined that the patient is able to fill this medication at [facility]. Please contact the patient to complete the onboarding or follow up with the prescribing physician as needed.

## 2021-09-28 ENCOUNTER — Ambulatory Visit (INDEPENDENT_AMBULATORY_CARE_PROVIDER_SITE_OTHER): Payer: BC Managed Care – PPO | Admitting: Family Medicine

## 2021-09-28 VITALS — BP 100/70 | HR 79 | Temp 97.4°F | Ht 61.0 in | Wt 195.1 lb

## 2021-09-28 DIAGNOSIS — F901 Attention-deficit hyperactivity disorder, predominantly hyperactive type: Secondary | ICD-10-CM | POA: Diagnosis not present

## 2021-09-28 MED ORDER — METHYLPHENIDATE HCL 20 MG PO TABS: 20.0000 mg | ORAL_TABLET | Freq: Every day | ORAL | 0 refills | Status: DC

## 2021-09-28 MED ORDER — METHYLPHENIDATE HCL 20 MG PO TABS
20.0000 mg | ORAL_TABLET | Freq: Every day | ORAL | 0 refills | Status: DC
Start: 2021-10-28 — End: 2022-02-12

## 2021-09-28 NOTE — Assessment & Plan Note (Signed)
Controlled. Cont Ritalin 20 mg with prn 10 mg tablet. Refill provided. Return in 3 months

## 2021-09-28 NOTE — Progress Notes (Signed)
   Subjective:     Courtney [name] is a 31 y.o. female presenting for Follow-up (3 mo ADHD)     HPI  #ADHD - doing well - no concerns   Prediabetes - low glucose - have symptomatic low sugars - improved with frequent snacking  Working to find a new psychiatrist - did not like buspar  Review of Systems   Social History   Tobacco Use  Smoking Status Never  Smokeless Tobacco Never        Objective:    BP Readings from Last 3 Encounters:  09/28/21 100/70  08/02/21 90/64  07/20/21 98/62   Wt Readings from Last 3 Encounters:  09/28/21 195 lb 2 oz (88.5 kg)  08/02/21 201 lb 6 oz (91.3 kg)  07/20/21 201 lb (91.2 kg)    BP 100/70   Pulse 79   Temp (!) 97.4 F (36.3 C) (Temporal)   Ht 5\' 1"  (1.549 m)   Wt 195 lb 2 oz (88.5 kg)   SpO2 96%   BMI 36.87 kg/m    Physical Exam Constitutional:      General: She is not in acute distress.    Appearance: She is well-developed. She is not diaphoretic.  HENT:     Right Ear: External ear normal.     Left Ear: External ear normal.     Nose: Nose normal.  Eyes:     Conjunctiva/sclera: Conjunctivae normal.  Cardiovascular:     Rate and Rhythm: Normal rate.  Pulmonary:     Effort: Pulmonary effort is normal.  Musculoskeletal:     Cervical back: Neck supple.  Skin:    General: Skin is warm and dry.     Capillary Refill: Capillary refill takes less than 2 seconds.  Neurological:     Mental Status: She is alert. Mental status is at baseline.  Psychiatric:        Mood and Affect: Mood normal.        Behavior: Behavior normal.           Assessment & Plan:   Problem List Items Addressed This Visit       Other   Attention deficit hyperactivity disorder (ADHD), predominantly hyperactive type - Primary    Controlled. Cont Ritalin 20 mg with prn 10 mg tablet. Refill provided. Return in 3 months        No follow-ups on file.  , MD

## 2021-09-28 NOTE — Patient Instructions (Addendum)
Courtney [name] is at [facility] in [place]  Good luck finding a new provider

## 2021-09-28 NOTE — Unmapped (Signed)
[facility]   Patient Onboarding/Medication Counseling    Melissa Pearson is a 31 y.o. female with inverse psoriasis who I am counseling today on initiation of therapy.  I am speaking to the patient.    Was a Nurse, learning disability used for this call? No    Verified patient's date of birth / HIPAA.    Specialty medication(s) to be sent: Inflammatory Disorders: Cosentyx      Non-specialty medications/supplies to be sent: sharps kit      Medications not needed at this time: n/a         Cosentyx (secukinumab)    Medication & Administration     Dosage: Plaque psoriasis: Inject 300mg  under the skin at weeks 0, 1, 2, 3, and 4 followed by 300mg  every 4 weeks      Lab tests required prior to treatment initiation:  Tuberculosis: Tuberculosis screening resulted in a non-reactive Quantiferon TB Gold assay.From 2020 - MD will recheck but OK's starting Cosentyx now.      Administration:     Prefilled Sensoready?? auto-injector pen  Gather all supplies needed for injection on a clean, flat working surface: medication pen removed from packaging, alcohol swab, sharps container, etc.  Look at the medication label - look for correct medication, correct dose, and check the expiration date  Look at the medication - the liquid visible in the window on the side of the pen device should appear clear and colorless to slightly yellow  Lay the auto-injector pen on a flat surface and allow it to warm up to room temperature for at least 15-30 minutes  Select injection site - you can use the front of your thigh or your belly (but not the area 2 inches around your belly button); if someone else is giving you the injection you can also use your upper arm in the skin covering your triceps muscle  Prepare injection site - wash your hands and clean the skin at the injection site with an alcohol swab and let it air dry, do not touch the injection site again before the injection  Twist off the purple safety cap in the direction of the arrow, do not remove until immediately prior to injection and do not touch the yellow needle cover  Put the white needle cover against your skin at the injection site at a 90 degree angle, hold the pen such that you can see the clear medication window  Press down and hold the pen firmly against your skin, there will be a click when the injection starts  Continue to hold the pen firmly against your skin for about 10-15 seconds - the window will start to turn solid green  There will be a second click sound when the injection is almost complete, verify the window is solid green to indicate the injection is complete and then pull the pen away from your skin  Dispose of the used auto-injector pen immediately in your sharps disposal container the needle will be covered automatically  If you see any blood at the injection site, press a cotton ball or gauze on the site and maintain pressure until the bleeding stops, do not rub the injection site      Adherence/Missed dose instructions:  If your injection is given more than 4 days after your scheduled injection date - consult your pharmacist for additional instructions on how to adjust your dosing schedule.        Goals of Therapy     Plaque Psoriasis  Minimize areas of skin involvement (% BSA)  Avoidance of long term glucocorticoid use  Maintenance of effective psychosocial functioning    Side Effects & Monitoring Parameters     Injection site reaction (redness, irritation, inflammation localized to the site of administration)  Signs of a common cold - minor sore throat, runny or stuffy nose, etc.  Diarrhea    The following side effects should be reported to the provider:  Signs of a hypersensitivity reaction - rash; hives; itching; red, swollen, blistered, or peeling skin; wheezing; tightness in the chest or throat; difficulty breathing, swallowing, or talking; swelling of the mouth, face, lips, tongue, or throat; etc.  Reduced immune function - report signs of infection such as fever; chills; body aches; very bad sore throat; ear or sinus pain; cough; more sputum or change in color of sputum; pain with passing urine; wound that will not heal, etc.  Also at a slightly higher risk of some malignancies (mainly skin and blood cancers) due to this reduced immune function.  In the case of signs of infection - the patient should hold the next dose of Cosentyx?? and call your primary care provider to ensure adequate medical care.  Treatment may be resumed when infection is treated and patient is asymptomatic.  Muscle pain or weakness  Shortness of breath      Warnings, Precautions, & Contraindications     Have your bloodwork checked as you have been told by your prescriber  Talk with your doctor if you are pregnant, planning to become pregnant, or breastfeeding  Discuss the possible need for holding your dose(s) of Cosentyx?? when a planned procedure is scheduled with the prescriber as it may delay healing/recovery timeline       Drug/Food Interactions     Medication list reviewed in Epic. The patient was instructed to inform the care team before taking any new medications or supplements. No drug interactions identified.   If you have a latex allergy use caution when handling, the needle cap of the Cosentyx?? prefilled syringe and the safety cap for the Cosentyx Sensoready?? pen contains a derivative of natural rubber latex  Talk with you prescriber or pharmacist before receiving any live vaccinations while taking this medication and after you stop taking it      Storage, Handling Precautions, & Disposal     Store this medication in the refrigerator.  Do not freeze  May store intact Sensoready pens and 150 mg/mL prefilled syringes at ?30??C (?86??F) for up to 4 days; may return to the refrigerator if unused  Store in original packaging, protected from light  Do not shake  Dispose of used syringes/pens in a sharps disposal container           Current Medications (including OTC/herbals), Comorbidities and Allergies     Current Outpatient Medications   Medication Sig Dispense Refill    albuterol HFA 90 mcg/actuation inhaler ProAir HFA 90 mcg/actuation aerosol inhaler      apixaban (ELIQUIS) 5 mg Tab Take 1 tablet (5 mg total) by mouth Two (2) times a day.      Bifidobacterium infantis (ALIGN ORAL) Take by mouth.      diazePAM (VALIUM) 10 MG tablet Take 1 tablet 30 minutes prior to dermatology procedure 1 tablet 0    diphenhydrAMINE (BENADRYL) 25 mg capsule/tablet Take 1 each (25 mg total) by mouth every six (6) hours as needed for itching.      empty container Misc Use as directed to dispose of Cosentyx pens.  1 each 2    EPINEPHrine (EPIPEN) 0.3 mg/0.3 mL injection INJECTAS DIRECTED AS NEEDED FOR SYSTEMIC REACTIONS  1    hydrOXYzine (ATARAX) 25 MG tablet Take 1 tablet (25 mg total) by mouth every eight (8) hours as needed for itching, allergies or anxiety. 20 tablet 0    infliximab (REMICADE IV) Remicade      lamoTRIgine (LAMICTAL) 100 MG tablet Take 0.5 tablets (50 mg total) by mouth.      methylphenidate HCl (RITALIN) 10 MG tablet TAKE 1 TABLET BY MOUTH THREE TIMES A DAY      methylphenidate HCl (RITALIN) 20 MG tablet Take 1 tablet (20 mg total) by mouth daily.      montelukast (SINGULAIR) 10 mg tablet TAKE 1 TABLET AT BEDTIME      norethindrone (MICRONOR) 0.35 mg tablet Take 1 tablet by mouth daily.      ondansetron (ZOFRAN-ODT) 4 MG disintegrating tablet Take 1 tablet (4 mg total) by mouth.      secukinumab (COSENTYX PEN, 2 PENS,) 150 mg/mL PnIj injection Inject the contents of 2 pens (300 mg total) once weekly at weeks 0, 1, 2, 3, and 4. As loading dose. 10 mL 0    secukinumab (COSENTYX PEN, 2 PENS,) 150 mg/mL PnIj injection Inject the contents of 2 pens (300 mg total) under the skin every twenty-eight (28) days. Maintenance dose. 2 mL 11    sulfamethoxazole-trimethoprim (BACTRIM DS) 800-160 mg per tablet Take 1 tablet (160 mg of trimethoprim total) by mouth Two (2) times a day. 60 tablet 3    traMADoL (ULTRAM) 50 mg tablet Take 1 tablet (50 mg total) by mouth every six (6) hours as needed for pain. 15 tablet 0    triamcinolone (KENALOG) 0.1 % ointment Apply topically Two (2) times a day. 454 g 5     Current Facility-Administered Medications   Medication Dose Route Frequency Provider Last Rate Last Admin    ertapenem Pincus Sanes) injection 1 g  1 g Intravenous Once Elsie Stain, MD         Facility-Administered Medications Ordered in Other Visits   Medication Dose Route Frequency Provider Last Rate Last Admin    acetaminophen (TYLENOL) 325 MG tablet             cetirizine (ZyrTEC) 10 MG tablet             diphenhydrAMINE (BENADRYL) 25 mg capsule/tablet             sodium chloride (NS) 0.9 % infusion  20 mL/hr Intravenous Once Elsie Stain, MD           Allergies   Allergen Reactions    Adhesive Tape-Silicones Hives     Pt can only tolerate cloth tape    Ginger Anaphylaxis    Other Hives     ABD pad causes rash  Pt can only tolerate cloth tape  Pt can only tolerate cloth tape    Shellfish Derived Anaphylaxis    Tapentadol Hives and Other (See Comments)     Pt can only tolerate cloth tape    Amoxicillin-Pot Clavulanate Headache    Opioids - Morphine Analogues       I become very angry    Iodine Nausea Only    Vancomycin Other (See Comments)     Burns her vein really bad and always causes them to blow.       Patient Active Problem List   Diagnosis    Hidradenitis suppurativa  Reviewed and up to date in Epic.    Appropriateness of Therapy     Acute infections noted within Epic:  No active infections  Patient reported infection: None    Is medication and dose appropriate based on diagnosis and infection status? Yes    Prescription has been clinically reviewed: Yes      Baseline Quality of Life Assessment      How many days over the past month did your psoriasis  keep you from your normal activities? For example, brushing your teeth or getting up in the morning. Patient declined to answer    Financial Information     Medication Assistance provided: Prior Authorization    Anticipated copay of $0 reviewed with patient. Verified delivery address.    Delivery Information     Scheduled delivery date: Thurs, 8/3    Expected start date: 8/3    Medication will be delivered via UPS to the prescription address in [facility].  This shipment will not require a signature.      Explained the services we provide at [facility] and that each month we would call to set up refills.  Stressed importance of returning phone calls so that we could ensure they receive their medications in time each month.  Informed patient that we should be setting up refills 7-10 days prior to when they will run out of medication.  A pharmacist will reach out to perform a clinical assessment periodically.  Informed patient that a welcome packet, containing information about our pharmacy and other support services, a Notice of Privacy Practices, and a drug information handout will be sent.      The patient or caregiver noted above participated in the development of this care plan and knows that they can request review of or adjustments to the care plan at any time.      Patient or caregiver verbalized understanding of the above information as well as how to contact the pharmacy at [phone] option 4 with any questions/concerns.  The pharmacy is open Monday through Friday 8:30am-4:30pm.  A pharmacist is available 24/7 via pager to answer any clinical questions they may have.    Patient Specific Needs     Does the patient have any physical, cognitive, or cultural barriers? No    Does the patient have adequate living arrangements? (i.e. the ability to store and take their medication appropriately) Yes    Did you identify any home environmental safety or security hazards? No    Patient prefers to have medications discussed with  Patient     Is the patient or caregiver able to read and understand education materials at a high school level or above? No    Patient's primary language is  English     Is the patient high risk? No    SOCIAL DETERMINANTS OF HEALTH     At the [facility], we have learned that life circumstances - like trouble affording food, housing, utilities, or transportation can affect the health of many of our patients.   That is why we wanted to ask: are you currently experiencing any life circumstances that are negatively impacting your health and/or quality of life? Patient declined to answer    Social Determinants of Health     Financial Resource Strain: Not on file   Internet Connectivity: Not on file   Food Insecurity: Not on file   Tobacco Use: Low Risk     Smoking Tobacco Use: Never  Smokeless Tobacco Use: Never    Passive Exposure: Not on file   Housing/Utilities: Not on file   Alcohol Use: Not on file   Transportation Needs: Not on file   Substance Use: Not on file   Health Literacy: Not on file   Physical Activity: Not on file   Interpersonal Safety: Not on file   Stress: Not on file   Intimate Partner Violence: Not on file   Depression: Not on file   Social Connections: Not on file       Would you be willing to receive help with any of the needs that you have identified today? Not applicable       Meghan A Desiree Lucy [facility] Specialty Pharmacist

## 2021-10-03 ENCOUNTER — Ambulatory Visit: Admit: 2021-10-03 | Discharge: 2021-10-03 | Payer: PRIVATE HEALTH INSURANCE

## 2021-10-03 DIAGNOSIS — L732 Hidradenitis suppurativa: Principal | ICD-10-CM

## 2021-10-03 MED ORDER — EMPTY CONTAINER
2 refills | 0 days
Start: 2021-10-03 — End: ?

## 2021-10-03 MED ADMIN — diphenhydrAMINE (BENADRYL) capsule/tablet 25 mg: 25 mg | ORAL | @ 13:00:00 | Stop: 2021-10-03

## 2021-10-03 MED ADMIN — cetirizine (ZyrTEC) tablet 10 mg: 10 mg | ORAL | @ 13:00:00 | Stop: 2021-10-03

## 2021-10-03 MED ADMIN — inFLIXimab-axxq (AVSOLA) 10 mg/kg = 900 mg in sodium chloride (NS) 250 mL IVPB: 10 mg/kg | INTRAVENOUS | @ 13:00:00 | Stop: 2021-10-03

## 2021-10-03 MED ADMIN — acetaminophen (TYLENOL) tablet 650 mg: 650 mg | ORAL | @ 13:00:00 | Stop: 2021-10-03

## 2021-10-03 NOTE — Unmapped (Signed)
0981 Patient presents for standard Avsola (infliximab-axxq) infusion.  In no acute distress. Vitals stable.  Reports no new medical issues or S/S of infection.  PIV placed 0845.  See MAR for premeds.pt has tried the accelerated rate and stated it makes her sick.     0901 Avsola (infliximab-axxq) 900 mg infusing as follows:     15ml/hr x 15 min  49ml/hr x 15 min  47ml/hr x 15 min  22ml/hr x 15 min  17ml/hr x 30 min  256ml/hr for the remainder of the infusion.    1106 Avsola (infliximab-axxq) infusion complete.  PIV flushed with NS.  Vitals stable.  Patient without any s/s of adverse reaction.    1107 IV d/c'd.  [facility].

## 2021-10-04 MED FILL — EMPTY CONTAINER: 120 days supply | Qty: 1 | Fill #0

## 2021-10-05 ENCOUNTER — Ambulatory Visit: Payer: BC Managed Care – PPO | Admitting: Dietician

## 2021-10-05 ENCOUNTER — Telehealth: Payer: Self-pay | Admitting: Family Medicine

## 2021-10-05 NOTE — Telephone Encounter (Signed)
Spoke to pharmacy and confirmed that Rx is ready for pick up. Spoke to pt's mom ( DPR) and notified her.

## 2021-10-05 NOTE — Telephone Encounter (Signed)
  Encourage patient to contact the pharmacy for refills or they can request refills through [facility]  Did the patient contact the pharmacy: Yes   LAST APPOINTMENT DATE: 09/28/21  NEXT APPOINTMENT DATE: N/A  MEDICATION: methylphenidate (RITALIN) 20 MG tablet  Is the patient out of medication?  No  If not, how much is left? 4 left  PHARMACY: [id number] [place], [place] - [address]  Comments: Patient reached out to pharmacy regarding a refills. Pharmacy stated they had refills put in but not available until August 25th that Dr. Selena Batten put in. While Dr. Selena Batten was on vacation another provider put a refill request in for Merrissa that was filled at the beginning of July. She need a refill request put in for now up until August 25th.   Let patient know to contact pharmacy at the end of the day to make sure medication is ready.  Please notify patient to allow 48-72 hours to process

## 2021-10-06 LAB — TB AG1: TB AG1 VALUE: 0.08

## 2021-10-06 LAB — QUANTIFERON TB GOLD PLUS
QUANTIFERON ANTIGEN 1 MINUS NIL: 0.03 [IU]/mL
QUANTIFERON ANTIGEN 2 MINUS NIL: 0.11 [IU]/mL
QUANTIFERON MITOGEN: 9.95 [IU]/mL
QUANTIFERON TB GOLD PLUS: NEGATIVE
QUANTIFERON TB NIL VALUE: 0.05 [IU]/mL

## 2021-10-06 LAB — TB MITOGEN: TB MITOGEN VALUE: 10

## 2021-10-06 LAB — TB NIL: TB NIL VALUE: 0.05

## 2021-10-06 LAB — TB AG2: TB AG2 VALUE: 0.16

## 2021-10-20 NOTE — Unmapped (Signed)
Melissa Pearson's mother reports her Cosentyx treatment is going well and her psoriasis is beginning to clear. Her 5th and final loading dose will be 8/31 and then first maintenance will be on 9/28.     [facility] Clinical Assessment & Refill Coordination Note    Melissa Pearson, DOB: [birth year]  Phone: [phone] (home) [phone] (work)    All above HIPAA information was verified with patient's family member, mother, [name].     Was a Nurse, learning disability used for this call? No    Specialty Medication(s):   Inflammatory Disorders: Cosentyx     Current Outpatient Medications   Medication Sig Dispense Refill    albuterol HFA 90 mcg/actuation inhaler ProAir HFA 90 mcg/actuation aerosol inhaler      apixaban (ELIQUIS) 5 mg Tab Take 1 tablet (5 mg total) by mouth Two (2) times a day.      Bifidobacterium infantis (ALIGN ORAL) Take by mouth.      diazePAM (VALIUM) 10 MG tablet Take 1 tablet 30 minutes prior to dermatology procedure 1 tablet 0    diphenhydrAMINE (BENADRYL) 25 mg capsule/tablet Take 1 each (25 mg total) by mouth every six (6) hours as needed for itching.      empty container Misc Use as directed to dispose of Cosentyx pens. 1 each 2    EPINEPHrine (EPIPEN) 0.3 mg/0.3 mL injection INJECTAS DIRECTED AS NEEDED FOR SYSTEMIC REACTIONS  1    hydrOXYzine (ATARAX) 25 MG tablet Take 1 tablet (25 mg total) by mouth every eight (8) hours as needed for itching, allergies or anxiety. 20 tablet 0    infliximab (REMICADE IV) Remicade      lamoTRIgine (LAMICTAL) 100 MG tablet Take 0.5 tablets (50 mg total) by mouth.      methylphenidate HCl (RITALIN) 10 MG tablet TAKE 1 TABLET BY MOUTH THREE TIMES A DAY      methylphenidate HCl (RITALIN) 20 MG tablet Take 1 tablet (20 mg total) by mouth daily.      montelukast (SINGULAIR) 10 mg tablet TAKE 1 TABLET AT BEDTIME      norethindrone (MICRONOR) 0.35 mg tablet Take 1 tablet by mouth daily.      ondansetron (ZOFRAN-ODT) 4 MG disintegrating tablet Take 1 tablet (4 mg total) by mouth.      secukinumab (COSENTYX PEN, 2 PENS,) 150 mg/mL PnIj injection Inject the contents of 2 pens (300 mg total) once weekly at weeks 0, 1, 2, 3, and 4. As loading dose. 10 mL 0    secukinumab (COSENTYX PEN, 2 PENS,) 150 mg/mL PnIj injection Inject the contents of 2 pens (300 mg total) under the skin every twenty-eight (28) days. Maintenance dose. 2 mL 11    sulfamethoxazole-trimethoprim (BACTRIM DS) 800-160 mg per tablet Take 1 tablet (160 mg of trimethoprim total) by mouth Two (2) times a day. 60 tablet 3    traMADoL (ULTRAM) 50 mg tablet Take 1 tablet (50 mg total) by mouth every six (6) hours as needed for pain. 15 tablet 0    triamcinolone (KENALOG) 0.1 % ointment Apply topically Two (2) times a day. 454 g 5     Current Facility-Administered Medications   Medication Dose Route Frequency Provider Last Rate Last Admin    ertapenem Pincus Sanes) injection 1 g  1 g Intravenous Once Elsie Stain, MD            Changes to medications: Bonnie reports no changes at this time.    Allergies   Allergen Reactions  Adhesive Tape-Silicones Hives     Pt can only tolerate cloth tape    Ginger Anaphylaxis    Other Hives     ABD pad causes rash  Pt can only tolerate cloth tape  Pt can only tolerate cloth tape    Shellfish Derived Anaphylaxis    Tapentadol Hives and Other (See Comments)     Pt can only tolerate cloth tape    Amoxicillin-Pot Clavulanate Headache    Opioids - Morphine Analogues       I become very angry    Iodine Nausea Only    Vancomycin Other (See Comments)     Burns her vein really bad and always causes them to blow.       Changes to allergies: No    SPECIALTY MEDICATION ADHERENCE     Cosentyx - 1 dose left  Medication Adherence    Patient reported X missed doses in the last month: 0  Specialty Medication: Cosentyx                            Specialty medication(s) dose(s) confirmed: Regimen is correct and unchanged.     Are there any concerns with adherence? No    Adherence counseling provided? Not needed    CLINICAL MANAGEMENT AND INTERVENTION      Clinical Benefit Assessment:    Do you feel the medicine is effective or helping your condition? Yes    Clinical Benefit counseling provided? Not needed    Adverse Effects Assessment:    Are you experiencing any side effects? No    Are you experiencing difficulty administering your medicine? No    Quality of Life Assessment:    Quality of Life    Rheumatology  Oncology  Dermatology  1. What impact has your specialty medication had on the symptoms of your skin condition (i.e. itchiness, soreness, stinging)?: Some  2. What impact has your specialty medication had on your comfort level with your skin?: Some  Cystic Fibrosis          Have you discussed this with your provider? Not needed    Acute Infection Status:    Acute infections noted within Epic:  No active infections  Patient reported infection: None    Therapy Appropriateness:    Is therapy appropriate and patient progressing towards therapeutic goals? Yes, therapy is appropriate and should be continued    DISEASE/MEDICATION-SPECIFIC INFORMATION      For patients on injectable medications: Patient currently has 1 doses left.  Next injection is scheduled for Thurs, 8/24 (5th load will be on 8/30 then move to every 4 weeks after that).    PATIENT SPECIFIC NEEDS     Does the patient have any physical, cognitive, or cultural barriers? No    Is the patient high risk? No    Does the patient require a Care Management Plan? No     SOCIAL DETERMINANTS OF HEALTH     At the [facility], we have learned that life circumstances - like trouble affording food, housing, utilities, or transportation can affect the health of many of our patients.   That is why we wanted to ask: are you currently experiencing any life circumstances that are negatively impacting your health and/or quality of life? Patient declined to answer    Social Determinants of Health     Financial Resource Strain: Not on file Internet Connectivity: Not on file   Food Insecurity: Not on file  Tobacco Use: Low Risk  (09/07/2021)    Patient History     Smoking Tobacco Use: Never     Smokeless Tobacco Use: Never     Passive Exposure: Not on file   Housing/Utilities: Not on file   Alcohol Use: Not on file   Transportation Needs: Not on file   Substance Use: Not on file   Health Literacy: Not on file   Physical Activity: Not on file   Interpersonal Safety: Not on file   Stress: Not on file   Intimate Partner Violence: Not on file   Depression: Not on file   Social Connections: Not on file       Would you be willing to receive help with any of the needs that you have identified today? Not applicable       SHIPPING     Specialty Medication(s) to be Shipped:   Inflammatory Disorders: Cosentyx    Other medication(s) to be shipped: No additional medications requested for fill at this time     Changes to insurance: No    Delivery Scheduled: Yes, Expected medication delivery date: 8/29.     Medication will be delivered via UPS to the confirmed prescription address in [place].    The patient will receive a drug information handout for each medication shipped and additional FDA Medication Guides as required.  Verified that patient has previously received a Conservation officer, historic buildings and a Surveyor, mining.    The patient or caregiver noted above participated in the development of this care plan and knows that they can request review of or adjustments to the care plan at any time.      All of the patient's questions and concerns have been addressed.    Lanney Gins   [facility] Specialty Pharmacist

## 2021-10-24 ENCOUNTER — Other Ambulatory Visit: Payer: Self-pay

## 2021-10-24 ENCOUNTER — Encounter: Payer: Self-pay | Admitting: Emergency Medicine

## 2021-10-24 ENCOUNTER — Ambulatory Visit
Admission: EM | Admit: 2021-10-24 | Discharge: 2021-10-24 | Disposition: A | Discharge status: Home / Self Care | Payer: BC Managed Care – PPO | Attending: Family Medicine | Admitting: Family Medicine

## 2021-10-24 DIAGNOSIS — Z20822 Contact with and (suspected) exposure to covid-19: Secondary | ICD-10-CM | POA: Diagnosis not present

## 2021-10-24 DIAGNOSIS — J069 Acute upper respiratory infection, unspecified: Secondary | ICD-10-CM | POA: Diagnosis not present

## 2021-10-24 DIAGNOSIS — R112 Nausea with vomiting, unspecified: Secondary | ICD-10-CM | POA: Insufficient documentation

## 2021-10-24 LAB — RESP PANEL BY RT-PCR (FLU A&B, COVID) ARPGX2
Influenza A by PCR: NEGATIVE
Influenza B by PCR: NEGATIVE
SARS Coronavirus 2 by RT PCR: NEGATIVE

## 2021-10-24 MED ORDER — ONDANSETRON 4 MG PO TBDP: 4.0000 mg | ORAL_TABLET | Freq: Three times a day (TID) | ORAL | 0 refills | Status: DC | PRN

## 2021-10-24 NOTE — ED Triage Notes (Signed)
Pt reports cough, fatigue, abdominal pain, headache, intermittent nausea and diarrhea. Pt reports has had several coworkers out with strep,covid,and flu. Denies any known fevers.  Covid/Flu Swab obtained in triage.

## 2021-10-24 NOTE — ED Provider Notes (Signed)
[facility]    CSN: [id number] Arrival date & time: 10/24/21  1415      History   Chief Complaint Chief Complaint  Patient presents with   Fatigue    HPI Courtney [name] is a 31 y.o. female.   Presenting today with 2-day history of cough, fatigue, upper abdominal pain, nausea, vomiting, headache, chills, diarrhea, congestion, ear pain and pressure.  Denies chest pain, shortness of breath, fever, body aches, rashes.  So far not trying anything over-the-counter for symptoms.  States multiple exposures to COVID and flu at work recently.    Past Medical History:  Diagnosis Date   ADHD (attention deficit hyperactivity disorder)    Bipolar disorder (HCC)    Chronic tonsillitis 08/2013   current strep, will finish antibiotic 09/04/2013; snores during sleep, mother denies apnea   Family history of adverse reaction to anesthesia    " My cousin had problems waking up. "   Heart murmur    "when I was born; outgrew it; I was a preemie"   Hidradenitis    Hidradenitis 2017   Migraine 12/19/2016   "I've just had this one for 3 days" (12/20/2016)   Septic shock (HCC) 07/02/2021    Patient Active Problem List   Diagnosis Date Noted   Acute non-recurrent frontal sinusitis 08/02/2021   Pulmonary embolism (HCC) 07/19/2021   Hypotension 07/11/2021   Trichomonas vaginitis 07/05/2021   Acute metabolic encephalopathy 07/05/2021   Bilateral pulmonary embolism (HCC) 07/05/2021   Anxiety disorder 06/28/2021   Prediabetes 05/25/2021   Allergy to adhesive tape 05/25/2021   Hidradenitis suppurativa 05/23/2021   Tachycardia 03/27/2021   Insomnia 03/27/2021   Anemia 12/21/2020   Hypoglycemia 11/17/2020   Bipolar disorder, in full remission, most recent episode mixed (HCC) 10/07/2020   Bipolar disorder, in full remission, most recent episode depressed (HCC) 03/03/2020   Nausea 11/17/2019   Bipolar 2 disorder, major depressive episode (HCC) 08/04/2019   At risk for long QT syndrome  08/04/2019   Obesity (BMI 35.0-39.9 without comorbidity) 03/19/2019   Reactive airway disease 02/11/2019   Ear itch 11/04/2018   Conductive hearing loss 03/13/2018   Axillary hidradenitis suppurativa 04/03/2017   Vitamin D deficiency 10/18/2014   Attention deficit hyperactivity disorder (ADHD), predominantly hyperactive type 02/11/2012    Past Surgical History:  Procedure Laterality Date   AXILLARY HIDRADENITIS EXCISION     AXILLARY LYMPH NODE BIOPSY Left 01/06/2019   CYSTOSCOPY W/ URETERAL STENT PLACEMENT Right 05/28/2017   Procedure: CYSTOSCOPY WITH RETROGRADE PYELOGRAM/ RIGHT URETERAL STENT PLACEMENT;  Surgeon: Crist Fat, MD;  Location: [facility];  Service: Urology;  Laterality: Right;   HYDRADENITIS EXCISION Bilateral 04/03/2017   Procedure: EXCISION AND DRAINAGE BILATERAL  HIDRADENITIS AXILLA;  Surgeon: Griselda Miner, MD;  Location: [facility] OR;  Service: General;  Laterality: Bilateral;   INCISION AND DRAINAGE ABSCESS Right 07/20/2016   Procedure: INCISION AND DRAINAGE ABSCESS right axilla;  Surgeon: Abigail Miyamoto, MD;  Location: [facility] OR;  Service: General;  Laterality: Right;   INTRAUTERINE DEVICE (IUD) INSERTION  11/2016   "had the one in my left arm removed"   IRRIGATION AND DEBRIDEMENT ABSCESS Right 12/21/2016   Procedure: IRRIGATION AND DEBRIDEMENT RIGHT AXILLARY HIDRADENITIS;  Surgeon: Andria Meuse, MD;  Location: [facility] OR;  Service: General;  Laterality: Right;   IRRIGATION AND DEBRIDEMENT ABSCESS Left 01/08/2017   Procedure: IRRIGATION AND DEBRIDEMENT AXILLARY ABSCESS;  Surgeon: Griselda Miner, MD;  Location: [facility] OR;  Service: General;  Laterality: Left;  NASAL SEPTOPLASTY W/ TURBINOPLASTY Bilateral 02/25/2019   Procedure: NASAL SEPTOPLASTY WITH TURBINATE REDUCTION;  Surgeon: Izora Gala, MD;  Location: [place];  Service: ENT;  Laterality: Bilateral;   TONSILLECTOMY Bilateral 09/14/2013   Procedure: BILATERAL TONSILLECTOMY;  Surgeon: Izora Gala, MD;   Location: [place];  Service: ENT;  Laterality: Bilateral;   TYMPANOPLASTY Bilateral    "rebuilt eardrums"   WISDOM TOOTH EXTRACTION      OB History   No obstetric history on file.      Home Medications    Prior to Admission medications   Medication Sig Start Date End Date Taking? Authorizing Provider  ondansetron (ZOFRAN-ODT) 4 MG disintegrating tablet Take 1 tablet (4 mg total) by mouth every 8 (eight) hours as needed for nausea or vomiting. 10/24/21  Yes Volney American, PA-C  apixaban (ELIQUIS) 5 MG TABS tablet Take 1 tablet (5 mg total) by mouth 2 (two) times daily. 08/03/21   Lesleigh Noe, MD  ARIPiprazole (ABILIFY) 2 MG tablet Take 1 tablet (2 mg total) by mouth daily. 09/25/21   Ursula Alert, MD  bifidobacterium infantis (ALIGN) capsule Take 1 capsule by mouth daily.    [provider]  diazepam (VALIUM) 10 MG tablet Take 1 tablet 30 minutes prior to dermatology procedure 06/26/21   [provider]  diphenhydrAMINE (BENADRYL) 25 mg capsule 25 mg every 6 (six) hours as needed.    [provider]  EPINEPHrine (EPIPEN 2-PAK) 0.3 mg/0.3 mL IJ SOAJ injection Inject 0.3 mg into the muscle as needed for anaphylaxis. 06/23/20   Lesleigh Noe, MD  glucose blood (CONTOUR TEST) test strip And lancets #100 05/18/21   Renato Shin, MD  hydrOXYzine (ATARAX) 25 MG tablet TAKE 1 TABLET BY MOUTH EVERY 8 HOURS AS NEEDED FOR ITCHING. 07/18/21   Lesleigh Noe, MD  inFLIXimab (REMICADE IV) Inject 1 Dose into the vein every 30 (thirty) days.    [provider]  Iron-Vitamin C 100-250 MG TABS Take 1 tablet by mouth daily.    [provider]  lamoTRIgine (LAMICTAL) 100 MG tablet Take 0.5 tablets (50 mg total) by mouth 2 (two) times daily. 03/27/21   Ursula Alert, MD  methylphenidate (RITALIN) 20 MG tablet Take 0.5 tablets (10 mg total) by mouth daily as needed. 09/04/21   Pleas Koch, NP  methylphenidate (RITALIN) 20 MG  tablet Take 1 tablet (20 mg total) by mouth daily before breakfast. 09/28/21 10/28/21  Lesleigh Noe, MD  methylphenidate (RITALIN) 20 MG tablet Take 1 tablet (20 mg total) by mouth daily before breakfast. 10/28/21 11/27/21  Lesleigh Noe, MD  methylphenidate (RITALIN) 20 MG tablet Take 1 tablet (20 mg total) by mouth daily before breakfast. 11/27/21 12/27/21  Lesleigh Noe, MD  Microlet Lancets MISC See admin instructions. 05/18/21   [provider]  montelukast (SINGULAIR) 10 MG tablet TAKE 1 TABLET BY MOUTH EVERYDAY AT BEDTIME 07/18/21   Lesleigh Noe, MD  norethindrone (MICRONOR) 0.35 MG tablet Take 1 tablet by mouth daily. 07/19/21   [provider]  traMADol (ULTRAM) 50 MG tablet Take 1 tablet (50 mg total) by mouth every six (6) hours as needed for pain. 08/08/21     traZODone (DESYREL) 50 MG tablet TAKE 1 TABLET BY MOUTH EVERYDAY AT BEDTIME 09/14/21   Lesleigh Noe, MD    Family History Family History  Problem Relation Age of Onset   Hypertension Mother    Hyperlipidemia Mother  Diabetes Mother    Hyperlipidemia Father    Heart attack Father 70   Alcohol abuse Paternal Uncle    Parkinson's disease Maternal Grandmother    Dementia Maternal Grandmother    Diabetes Maternal Grandmother    Bell's palsy Maternal Grandmother     Social History Social History   Tobacco Use   Smoking status: Never   Smokeless tobacco: Never  Vaping Use   Vaping Use: Former   Quit date: 07/03/2021   Substances: CBD  Substance Use Topics   Alcohol use: Not Currently   Drug use: Not Currently    Types: Marijuana     Allergies   Other, Shellfish allergy, Tapentadol, Tape, Ginger, Morphine and related, and Vancomycin   Review of Systems Review of Systems Per HPI  Physical Exam Triage Vital Signs ED Triage Vitals [10/24/21 1426]  Enc Vitals Group     BP 101/68     Pulse Rate 83     Resp 20     Temp 98.1 F (36.7 C)     Temp Source Oral     SpO2 98 %     Weight       Height      Head Circumference      Peak Flow      Pain Score 4     Pain Loc      Pain Edu?      Excl. in [place]?    No data found.  Updated Vital Signs BP 101/68 (BP Location: Right Arm)   Pulse 83   Temp 98.1 F (36.7 C) (Oral)   Resp 20   LMP 10/24/2021   SpO2 98%   Visual Acuity Right Eye Distance:   Left Eye Distance:   Bilateral Distance:    Right Eye Near:   Left Eye Near:    Bilateral Near:     Physical Exam Vitals and nursing note reviewed.  Constitutional:      Appearance: Normal appearance. She is not ill-appearing.  HENT:     Head: Atraumatic.     Right Ear: Tympanic membrane and external ear normal.     Left Ear: Tympanic membrane and external ear normal.     Nose: Rhinorrhea present.     Mouth/Throat:     Mouth: Mucous membranes are moist.     Pharynx: Posterior oropharyngeal erythema present.  Eyes:     Extraocular Movements: Extraocular movements intact.     Conjunctiva/sclera: Conjunctivae normal.  Cardiovascular:     Rate and Rhythm: Normal rate and regular rhythm.     Heart sounds: Normal heart sounds.  Pulmonary:     Effort: Pulmonary effort is normal.     Breath sounds: Normal breath sounds. No wheezing or rales.  Abdominal:     General: Bowel sounds are normal. There is no distension.     Palpations: Abdomen is soft.     Tenderness: There is abdominal tenderness. There is no right CVA tenderness, left CVA tenderness or guarding.     Comments: Mild epigastric tenderness to palpation without distention or guarding  Musculoskeletal:        General: Normal range of motion.     Cervical back: Normal range of motion and neck supple.  Skin:    General: Skin is warm and dry.  Neurological:     Mental Status: She is alert and oriented to person, place, and time.  Psychiatric:        Mood and Affect: Mood normal.  Thought Content: Thought content normal.        Judgment: Judgment normal.      UC Treatments / Results  Labs (all  labs ordered are listed, but only abnormal results are displayed) Labs Reviewed  RESP PANEL BY RT-PCR (FLU A&B, COVID) [id number]    EKG   Radiology No results found.  Procedures Procedures (including critical care time)  Medications Ordered in UC Medications - No data to display  Initial Impression / Assessment and Plan / UC Course  I have reviewed the triage vital signs and the nursing notes.  Pertinent labs & imaging results that were available during my care of the patient were reviewed by me and considered in my medical decision making (see chart for details).     Respiratory panel pending, vital signs benign and reassuring, exam suspicious for viral upper respiratory infection.  Discussed DayQuil, Mucinex and other supportive medications and will give Zofran for nausea, vomiting to help keep her hydrated.  Work note given, discussed return precautions  Final Clinical Impressions(s) / UC Diagnoses   Final diagnoses:  Viral URI  Nausea and vomiting, unspecified vomiting type   Discharge Instructions   None    ED Prescriptions     Medication Sig Dispense Auth. Provider   ondansetron (ZOFRAN-ODT) 4 MG disintegrating tablet Take 1 tablet (4 mg total) by mouth every 8 (eight) hours as needed for nausea or vomiting. 20 tablet Particia Nearing, [place]      PDMP not reviewed this encounter.   Particia Nearing, [place] 10/24/21 1501

## 2021-10-25 ENCOUNTER — Ambulatory Visit: Payer: BC Managed Care – PPO | Admitting: Psychiatry

## 2021-10-30 MED FILL — COSENTYX PEN 300 MG/2 PENS (150 MG/ML) SUBCUTANEOUS: 28 days supply | Qty: 2 | Fill #1

## 2021-10-31 ENCOUNTER — Ambulatory Visit: Admit: 2021-10-31 | Discharge: 2021-11-01 | Payer: PRIVATE HEALTH INSURANCE

## 2021-10-31 DIAGNOSIS — L732 Hidradenitis suppurativa: Secondary | ICD-10-CM | POA: Diagnosis not present

## 2021-10-31 MED ADMIN — acetaminophen (TYLENOL) tablet 650 mg: 650 mg | ORAL | @ 14:00:00 | Stop: 2021-10-31

## 2021-10-31 MED ADMIN — cetirizine (ZyrTEC) tablet 10 mg: 10 mg | ORAL | @ 14:00:00 | Stop: 2021-10-31

## 2021-10-31 MED ADMIN — inFLIXimab-axxq (AVSOLA) 10 mg/kg = 900 mg in sodium chloride (NS) 250 mL IVPB: 10 mg/kg | INTRAVENOUS | @ 15:00:00 | Stop: 2021-10-31

## 2021-10-31 MED ADMIN — diphenhydrAMINE (BENADRYL) capsule/tablet 25 mg: 25 mg | ORAL | @ 14:00:00 | Stop: 2021-10-31

## 2021-10-31 NOTE — Unmapped (Signed)
1045 Patient presents for standard Avsola (infliximab-axxq) infusion.  In no acute distress. Vitals stable.  Reports no new medical issues or S/S of infection.  PIV placed.  See MAR for premeds.    1115  Avsola (infliximab-axxq) 900 mg infusing as follows:     22ml/hr x 15 min  35ml/hr x 15 min  62ml/hr x 15 min  74ml/hr x 15 min  166ml/hr x 30 min  235ml/hr for the remainder of the infusion.    1324  Avsola (infliximab-axxq) infusion complete.  PIV flushed with NS.  Vitals stable.  Patient without any s/s of adverse reaction.    1330 IV d/c'd.  Patient discharged from Infusion Center.

## 2021-11-08 ENCOUNTER — Other Ambulatory Visit: Payer: Self-pay | Admitting: Psychiatry

## 2021-11-08 ENCOUNTER — Other Ambulatory Visit: Payer: Self-pay | Admitting: Family Medicine

## 2021-11-08 DIAGNOSIS — L732 Hidradenitis suppurativa: Principal | ICD-10-CM

## 2021-11-08 DIAGNOSIS — F418 Other specified anxiety disorders: Secondary | ICD-10-CM

## 2021-11-08 DIAGNOSIS — G4700 Insomnia, unspecified: Secondary | ICD-10-CM

## 2021-11-08 MED ORDER — CLINDAMYCIN HCL 300 MG CAPSULE
ORAL_CAPSULE | ORAL | 11 refills | 0.00000 days
Start: 2021-11-08 — End: ?

## 2021-11-09 MED ORDER — CLINDAMYCIN HCL 300 MG CAPSULE
ORAL_CAPSULE | ORAL | 11 refills | 0.00000 days | Status: CP
Start: 2021-11-09 — End: ?

## 2021-11-20 DIAGNOSIS — H7292 Unspecified perforation of tympanic membrane, left ear: Secondary | ICD-10-CM | POA: Diagnosis not present

## 2021-11-20 DIAGNOSIS — H7321 Unspecified myringitis, right ear: Secondary | ICD-10-CM | POA: Diagnosis not present

## 2021-11-20 NOTE — Unmapped (Signed)
[facility] Refill Coordination Note    Specialty Medication(s) to be Shipped:   Inflammatory Disorders: Cosentyx    Other medication(s) to be shipped: No additional medications requested for fill at this time     Melissa Pearson, DOB: 29-Dec-1990  Phone: [phone] (home) [phone] (work)      All above HIPAA information was verified with patient.     Was a Nurse, learning disability used for this call? No    Completed refill call assessment today to schedule patient's medication shipment from the [facility] [phone]).  All relevant notes have been reviewed.     Specialty medication(s) and dose(s) confirmed: Regimen is correct and unchanged.   Changes to medications: Melissa Pearson reports no changes at this time.  Changes to insurance: No  New side effects reported not previously addressed with a pharmacist or physician: None reported  Questions for the pharmacist: No    Confirmed patient received a Conservation officer, historic buildings and a Surveyor, mining with first shipment. The patient will receive a drug information handout for each medication shipped and additional FDA Medication Guides as required.       DISEASE/MEDICATION-SPECIFIC INFORMATION        For patients on injectable medications: Patient currently has 0 doses left.  Next injection is scheduled for 11/30/2021.    SPECIALTY MEDICATION ADHERENCE     Medication Adherence    Patient reported X missed doses in the last month: 0  Specialty Medication: Cosentyx  Patient is on additional specialty medications: No  Any gaps in refill history greater than 2 weeks in the last 3 months: no  Demonstrates understanding of importance of adherence: yes  Informant: patient  Reliability of informant: reliable              Confirmed plan for next specialty medication refill: delivery by pharmacy  Refills needed for supportive medications: not needed              Were doses missed due to medication being on hold? No    Cosentyx 150 mg/ml: 0 days of medicine on hand       REFERRAL TO PHARMACIST     Referral to the pharmacist: Not needed      [facility]     Shipping address confirmed in Epic.     Delivery Scheduled: Yes, Expected medication delivery date: 11/28/2021.     Medication will be delivered via UPS to the prescription address in Epic WAM.    [name]   [facility] Specialty Technician

## 2021-11-23 ENCOUNTER — Encounter: Payer: Self-pay | Admitting: Psychiatry

## 2021-11-23 ENCOUNTER — Ambulatory Visit (INDEPENDENT_AMBULATORY_CARE_PROVIDER_SITE_OTHER): Payer: BC Managed Care – PPO | Admitting: Psychiatry

## 2021-11-23 VITALS — BP 108/73 | Temp 98.1°F | Ht 61.0 in | Wt 196.0 lb

## 2021-11-23 DIAGNOSIS — F3181 Bipolar II disorder: Secondary | ICD-10-CM | POA: Diagnosis not present

## 2021-11-23 DIAGNOSIS — F901 Attention-deficit hyperactivity disorder, predominantly hyperactive type: Secondary | ICD-10-CM | POA: Diagnosis not present

## 2021-11-23 DIAGNOSIS — G4701 Insomnia due to medical condition: Secondary | ICD-10-CM

## 2021-11-23 DIAGNOSIS — F418 Other specified anxiety disorders: Secondary | ICD-10-CM

## 2021-11-23 NOTE — Progress Notes (Signed)
BH MD OP Progress Note  11/23/2021 1:33 PM Courtney [name]  MRN:  [id number]  Chief Complaint:  Chief Complaint  Patient presents with   Follow-up   Anxiety   HPI: Courtney [name] is a 31 year old female single, lives in [place], currently employed, with history of bipolar disorder type II, ADHD, migraine headaches, hidradenitis was evaluated in office today.   Patient reports she was able to get a job with [employer].  She currently works as an Museum/gallery exhibitions officer.  Patient reports she likes this job and has been doing it since the past 3 months.  She reports she has a good relationship with her supervisor and that helps.  Patient reports however she is anxious and sad about the fact that her friend who got her this job is currently in a medically induced, and having seizures, admitted to the ICU.  Patient reports she is currently trying to support the family.  She has reached out to [facility] and has upcoming therapy sessions scheduled.  Looks forward to that.  Patient reports sleep is restless mostly because of her work schedule.  She was working dayshift however since the past few days she has been working night shifts.  She works from 8 PM to 8 AM.  That does have an impact on her sleep.  She does have trazodone and hydroxyzine available which does help her to sleep.  Patient denies any significant depressive symptoms.  Denies any suicidality, homicidality or perceptual disturbances.  Currently struggling with sinus congestion, reports she has medications prescribed and agrees to follow up with her providers.  Denies any other concerns today. Visit Diagnosis:    ICD-10-CM   1. Bipolar II disorder in full remission (HCC)  F31.81    most recent depressed mixed, mild     2. Insomnia due to medical condition  G47.01    mood, sinus congestion    3. Other specified anxiety disorders  F41.8    generalized anxiety not occurring more days than not    4. Attention deficit  hyperactivity disorder (ADHD), predominantly hyperactive type  F90.1       Past Psychiatric History: Reviewed past psychiatric history from progress note on 08/04/2019.  Past trials of Ritalin, Daytrana patch, Adderall, risperidone, Concerta.  Past Medical History:  Past Medical History:  Diagnosis Date   ADHD (attention deficit hyperactivity disorder)    Bipolar disorder (HCC)    Chronic tonsillitis 08/2013   current strep, will finish antibiotic 09/04/2013; snores during sleep, mother denies apnea   Family history of adverse reaction to anesthesia    " My cousin had problems waking up. "   Heart murmur    "when I was born; outgrew it; I was a preemie"   Hidradenitis    Hidradenitis 2017   Migraine 12/19/2016   "I've just had this one for 3 days" (12/20/2016)   Septic shock (HCC) 07/02/2021    Past Surgical History:  Procedure Laterality Date   AXILLARY HIDRADENITIS EXCISION     AXILLARY LYMPH NODE BIOPSY Left 01/06/2019   CYSTOSCOPY W/ URETERAL STENT PLACEMENT Right 05/28/2017   Procedure: CYSTOSCOPY WITH RETROGRADE PYELOGRAM/ RIGHT URETERAL STENT PLACEMENT;  Surgeon: Crist Fat, MD;  Location: [facility];  Service: Urology;  Laterality: Right;   HYDRADENITIS EXCISION Bilateral 04/03/2017   Procedure: EXCISION AND DRAINAGE BILATERAL  HIDRADENITIS AXILLA;  Surgeon: Griselda Miner, MD;  Location: [facility] OR;  Service: General;  Laterality: Bilateral;   INCISION AND DRAINAGE ABSCESS Right  07/20/2016   Procedure: INCISION AND DRAINAGE ABSCESS right axilla;  Surgeon: Abigail Miyamoto, MD;  Location: [facility] OR;  Service: General;  Laterality: Right;   INTRAUTERINE DEVICE (IUD) INSERTION  11/2016   "had the one in my left arm removed"   IRRIGATION AND DEBRIDEMENT ABSCESS Right 12/21/2016   Procedure: IRRIGATION AND DEBRIDEMENT RIGHT AXILLARY HIDRADENITIS;  Surgeon: Andria Meuse, MD;  Location: [facility] OR;  Service: General;  Laterality: Right;   IRRIGATION AND DEBRIDEMENT ABSCESS Left  01/08/2017   Procedure: IRRIGATION AND DEBRIDEMENT AXILLARY ABSCESS;  Surgeon: Griselda Miner, MD;  Location: [facility] OR;  Service: General;  Laterality: Left;   NASAL SEPTOPLASTY W/ TURBINOPLASTY Bilateral 02/25/2019   Procedure: NASAL SEPTOPLASTY WITH TURBINATE REDUCTION;  Surgeon: Serena Colonel, MD;  Location: [facility];  Service: ENT;  Laterality: Bilateral;   TONSILLECTOMY Bilateral 09/14/2013   Procedure: BILATERAL TONSILLECTOMY;  Surgeon: Serena Colonel, MD;  Location: [facility];  Service: ENT;  Laterality: Bilateral;   TYMPANOPLASTY Bilateral    "rebuilt eardrums"   WISDOM TOOTH EXTRACTION      Family Psychiatric History: Reviewed family psychiatric history from progress note on 08/04/2019.  Family History:  Family History  Problem Relation Age of Onset   Hypertension Mother    Hyperlipidemia Mother    Diabetes Mother    Hyperlipidemia Father    Heart attack Father 36   Alcohol abuse Paternal Uncle    Parkinson's disease Maternal Grandmother    Dementia Maternal Grandmother    Diabetes Maternal Grandmother    Bell's palsy Maternal Grandmother     Social History: Reviewed social history from progress note on 08/04/2019. Social History   Socioeconomic History   Marital status: Single    Spouse name: Not on file   Number of children: Not on file   Years of education: EMS/Fire training   Highest education level: Not on file  Occupational History   Not on file  Tobacco Use   Smoking status: Never   Smokeless tobacco: Never  Vaping Use   Vaping Use: Former   Quit date: 07/03/2021   Substances: CBD  Substance and Sexual Activity   Alcohol use: Not Currently   Drug use: Not Currently    Types: Marijuana   Sexual activity: Yes    Birth control/protection: Pill, Condom  Other Topics Concern   Not on file  Social History Narrative   02/11/19   From: the area   Living: with Mom currently, working to find her own place   Work: EMS/Fire       Family: good relationship with Mom and Grandma      Enjoys: spend time with friends/family, hunting, bomb fire      Exercise: every shift does training - 2-3 times a week   Diet: well rounded, meat heavy      Safety   Seat belts: Yes    Guns: Yes  and secure   Safe in relationships: Yes    Helmet: no    Social Determinants of Health   Financial Resource Strain: Low Risk  (02/11/2019)   Overall Financial Resource Strain (CARDIA)    Difficulty of Paying Living Expenses: Not hard at all  Food Insecurity: Not on file  Transportation Needs: Not on file  Physical Activity: Not on file  Stress: Not on file  Social Connections: Not on file    Allergies:  Allergies  Allergen Reactions   Other Hives    ABD pad causes rash Pt can only  tolerate cloth tape Pt can only tolerate cloth tape   Shellfish Allergy Hives   Shellfish-Derived Products Shortness Of Breath   Tapentadol Hives and Other (See Comments)    Pt can only tolerate cloth tape   Tape Hives and Other (See Comments)    Pt can only tolerate cloth tape   Ginger Hives   Morphine And Related     " I become very angry"   Vancomycin Other (See Comments)    Burns her vein really bad and always causes them to blow.    Metabolic Disorder Labs: Lab Results  Component Value Date   HGBA1C 5.6 05/18/2021   No results found for: "PROLACTIN" Lab Results  Component Value Date   CHOL 195 07/11/2020   TRIG 224.0 (H) 07/11/2020   HDL 41.80 07/11/2020   CHOLHDL 5 07/11/2020   VLDL 44.8 (H) 07/11/2020   LDLCALC 119 (H) 05/18/2019   LDLCALC 116 (H) 12/03/2016   Lab Results  Component Value Date   TSH 1.00 07/11/2021   TSH 0.62 02/14/2021    Therapeutic Level Labs: No results found for: "LITHIUM" No results found for: "VALPROATE" No results found for: "CBMZ"  Current Medications: Current Outpatient Medications  Medication Sig Dispense Refill   ARIPiprazole (ABILIFY) 2 MG tablet Take 1 tablet (2 mg total) by mouth  daily. 90 tablet 1   bifidobacterium infantis (ALIGN) capsule Take 1 capsule by mouth daily.     busPIRone (BUSPAR) 10 MG tablet TAKE 1 TABLET BY MOUTH TWICE A DAY 180 tablet 0   ciprofloxacin-dexamethasone (CIPRODEX) OTIC suspension Place 4 drops into both ears 2 times daily for 10 days.     clindamycin (CLEOCIN) 300 MG capsule Take 300 mg by mouth 2 (two) times daily.     COSENTYX SENSOREADY, 300 MG, 150 MG/ML SOAJ Inject 2 Syringes into the skin every 28 (twenty-eight) days.     diazepam (VALIUM) 10 MG tablet Take 1 tablet 30 minutes prior to dermatology procedure     diphenhydrAMINE (BENADRYL) 25 mg capsule 25 mg every 6 (six) hours as needed.     ELIQUIS 5 MG TABS tablet TAKE 1 TABLET BY MOUTH TWICE A DAY 180 tablet 0   EPINEPHrine (EPIPEN 2-PAK) 0.3 mg/0.3 mL IJ SOAJ injection Inject 0.3 mg into the muscle as needed for anaphylaxis. 1 each 0   ertapenem (INVANZ) 1 g injection Inject into the vein.     glucose blood (CONTOUR TEST) test strip And lancets #100 100 each 12   HYDROcodone-acetaminophen (NORCO/VICODIN) 5-325 MG tablet hydrocodone 5 mg-acetaminophen 325 mg tablet  TAKE 1 TO 2 TABLETS BY MOUTH EVERY 6 HOURS AS NEEDED FOR PAIN     hydrOXYzine (ATARAX) 25 MG tablet TAKE 1 TABLET BY MOUTH EVERY 8 HOURS AS NEEDED FOR ITCHING. 270 tablet 1   inFLIXimab (REMICADE IV) Inject 1 Dose into the vein every 30 (thirty) days.     Iron-Vitamin C 100-250 MG TABS Take 1 tablet by mouth daily.     lamoTRIgine (LAMICTAL) 100 MG tablet Take 0.5 tablets (50 mg total) by mouth 2 (two) times daily. 90 tablet 1   methylphenidate (RITALIN) 20 MG tablet Take 0.5 tablets (10 mg total) by mouth daily as needed. 30 tablet 0   methylphenidate (RITALIN) 20 MG tablet Take 1 tablet (20 mg total) by mouth daily before breakfast. 30 tablet 0   [START ON 11/27/2021] methylphenidate (RITALIN) 20 MG tablet Take 1 tablet (20 mg total) by mouth daily before breakfast. 30 tablet 0  Microlet Lancets MISC See admin  instructions.     montelukast (SINGULAIR) 10 MG tablet TAKE 1 TABLET BY MOUTH EVERYDAY AT BEDTIME 90 tablet 0   norethindrone (MICRONOR) 0.35 MG tablet Take 1 tablet by mouth daily.     ondansetron (ZOFRAN-ODT) 4 MG disintegrating tablet Take 1 tablet (4 mg total) by mouth every 8 (eight) hours as needed for nausea or vomiting. 20 tablet 0   traMADol (ULTRAM) 50 MG tablet Take 1 tablet (50 mg total) by mouth every six (6) hours as needed for pain. 15 tablet 0   traZODone (DESYREL) 50 MG tablet TAKE 1 TABLET BY MOUTH EVERYDAY AT BEDTIME 90 tablet 0   methylphenidate (RITALIN) 20 MG tablet Take 1 tablet (20 mg total) by mouth daily before breakfast. 30 tablet 0   No current facility-administered medications for this visit.     Musculoskeletal: Strength & Muscle Tone: within normal limits Gait & Station: normal Patient leans: N/A  Psychiatric Specialty Exam: Review of Systems  HENT:  Positive for congestion and sinus pressure.   Psychiatric/Behavioral:  Positive for sleep disturbance. The patient is nervous/anxious.   All other systems reviewed and are negative.   Blood pressure 108/73, temperature 98.1 F (36.7 C), temperature source Temporal, height 5\' 1"  (1.549 m), weight 196 lb (88.9 kg), last menstrual period 10/24/2021, SpO2 98 %.Body mass index is 37.03 kg/m.  General Appearance: Casual  Eye Contact:  Good  Speech:  Normal Rate  Volume:  Normal  Mood:  Anxious  Affect:  Congruent  Thought Process:  Goal Directed and Descriptions of Associations: Intact  Orientation:  Full (Time, Place, and Person)  Thought Content: Logical   Suicidal Thoughts:  No  Homicidal Thoughts:  No  Memory:  Immediate;   Fair Recent;   Fair Remote;   Fair  Judgement:  Fair  Insight:  Fair  Psychomotor Activity:  Normal  Concentration:  Concentration: Fair and Attention Span: Fair  Recall:  [employer] of Knowledge: Fair  Language: Fair  Akathisia:  No  Handed:  Right  AIMS (if indicated):  done  Assets:  Communication Skills Desire for [place] Talents/Skills Transportation  ADL's:  Intact  Cognition: WNL  Sleep:  Poor   Screenings: [place] Office Visit from 11/23/2021 in [place] Office Visit from 08/08/2021 in [place] Office Visit from 06/28/2021 in [place] Total Score 0 0 0      [place] Visit from 11/23/2021 in [place] Visit from 03/27/2021 in [place] Video Visit from 10/26/2020 in [place] Visit from 10/07/2020 in [place] Video Visit from 06/22/2020 in [place]  Total GAD-7 Score 11 3 3 10 4       PHQ2-9    [place] Visit from 11/23/2021 in [place] Visit from 08/08/2021 in [place] Visit from 06/28/2021 in [place] Office Visit from 03/27/2021 in [place] Video Visit from 10/26/2020 in [place]  PHQ-2 Total Score 3 4 4 4 1   PHQ-9 Total Score 11 10 11 14 4       [place] Visit from 11/23/2021 in [place] ED from 10/24/2021 in [facility] [place] from 08/08/2021 in [place]  C-SSRS RISK CATEGORY No Risk No Risk No Risk        Assessment and Plan: Courtney [name] is a 31 year old Caucasian female, employed, lives in [place], has a history of ADHD, migraine headaches, bipolar disorder type II currently in remission, anxiety, sleep problems, sinusitis, was evaluated in office today.  Patient currently struggling with sleep problems mostly because of her  sinus congestion also does report anxiety due to situational stressors.  She will benefit from the following plan.  Plan Bipolar disorder type II in remission most recent episode depressed mixed Lamotrigine 100 mg p.o. daily Abilify 2 mg p.o. daily Trazodone 50 mg p.o. nightly  Insomnia-restless Continue trazodone 50 mg p.o. nightly Current sleep problems mostly due to her work shift as well as sinus congestion.   Other specified anxiety disorder-generalized anxiety not occurring more days than not-unstable Patient to start psychotherapy sessions with the [facility]. Continue BuSpar 10 mg p.o. twice daily She also has hydroxyzine 25 mg p.o. 3 times daily as needed which helps with anxiety.  ADHD per history-patient advised to get ADHD testing report, agrees to fax it to [place].   I have reviewed labs-hemoglobin A1c-5.6-within normal limits-dated 05/18/2021, lipid panel-07/11/2020-abnormal-patient will benefit from repeat-agrees to follow up with primary care provider.   Follow-up in clinic in 2 months or sooner if needed.   This note was generated in part or whole with voice recognition software. Voice recognition is usually quite accurate but there are transcription errors that can and very often do occur. I apologize for any typographical errors that were not detected and corrected.    Jomarie Longs, MD 11/24/2021, 8:15 AM

## 2021-11-24 MED ORDER — LAMOTRIGINE 100 MG PO TABS: 50.0000 mg | ORAL_TABLET | Freq: Two times a day (BID) | ORAL | 1 refills | Status: DC

## 2021-11-27 MED FILL — COSENTYX PEN 300 MG/2 PENS (150 MG/ML) SUBCUTANEOUS: SUBCUTANEOUS | 28 days supply | Qty: 2 | Fill #0

## 2021-11-28 ENCOUNTER — Ambulatory Visit: Admit: 2021-11-28 | Discharge: 2021-11-29 | Payer: PRIVATE HEALTH INSURANCE

## 2021-11-28 DIAGNOSIS — L732 Hidradenitis suppurativa: Secondary | ICD-10-CM | POA: Diagnosis not present

## 2021-11-28 LAB — CBC W/ AUTO DIFF
BASOPHILS ABSOLUTE COUNT: 0.1 10*9/L (ref 0.0–0.1)
BASOPHILS RELATIVE PERCENT: 0.8 %
EOSINOPHILS ABSOLUTE COUNT: 0.5 10*9/L (ref 0.0–0.5)
EOSINOPHILS RELATIVE PERCENT: 3.5 %
HEMATOCRIT: 35.2 % (ref 34.0–44.0)
HEMOGLOBIN: 11.6 g/dL (ref 11.3–14.9)
LYMPHOCYTES ABSOLUTE COUNT: 3.6 10*9/L (ref 1.1–3.6)
LYMPHOCYTES RELATIVE PERCENT: 26.5 %
MEAN CORPUSCULAR HEMOGLOBIN CONC: 32.9 g/dL (ref 32.0–36.0)
MEAN CORPUSCULAR HEMOGLOBIN: 28 pg (ref 25.9–32.4)
MEAN CORPUSCULAR VOLUME: 84.9 fL (ref 77.6–95.7)
MEAN PLATELET VOLUME: 7.5 fL (ref 6.8–10.7)
MONOCYTES ABSOLUTE COUNT: 0.7 10*9/L (ref 0.3–0.8)
MONOCYTES RELATIVE PERCENT: 5.1 %
NEUTROPHILS ABSOLUTE COUNT: 8.6 10*9/L — ABNORMAL HIGH (ref 1.8–7.8)
NEUTROPHILS RELATIVE PERCENT: 64.1 %
NUCLEATED RED BLOOD CELLS: 0 /100{WBCs} (ref <=4)
PLATELET COUNT: 336 10*9/L (ref 150–450)
RED BLOOD CELL COUNT: 4.15 10*12/L (ref 3.95–5.13)
RED CELL DISTRIBUTION WIDTH: 15 % (ref 12.2–15.2)
WBC ADJUSTED: 13.5 10*9/L — ABNORMAL HIGH (ref 3.6–11.2)

## 2021-11-28 LAB — SEDIMENTATION RATE: ERYTHROCYTE SEDIMENTATION RATE: 99 mm/h — ABNORMAL HIGH (ref 0–20)

## 2021-11-28 LAB — BUN: BLOOD UREA NITROGEN: <5 mg/dL — ABNORMAL LOW (ref 9–23)

## 2021-11-28 LAB — C-REACTIVE PROTEIN: C-REACTIVE PROTEIN: 34 mg/L — ABNORMAL HIGH (ref <=10.0)

## 2021-11-28 LAB — ALT: ALT (SGPT): 11 U/L (ref 10–49)

## 2021-11-28 LAB — AST: AST (SGOT): 16 U/L (ref <=34)

## 2021-11-28 LAB — CREATININE
CREATININE: 0.74 mg/dL
EGFR CKD-EPI (2021) FEMALE: >90 mL/min/{1.73_m2} (ref >=60)

## 2021-11-28 MED ADMIN — cetirizine (ZyrTEC) tablet 10 mg: 10 mg | ORAL | @ 15:00:00 | Stop: 2021-11-28

## 2021-11-28 MED ADMIN — inFLIXimab-axxq (AVSOLA) 10 mg/kg = 900 mg in sodium chloride (NS) 250 mL IVPB: 10 mg/kg | INTRAVENOUS | @ 15:00:00 | Stop: 2021-11-28

## 2021-11-28 MED ADMIN — acetaminophen (TYLENOL) tablet 650 mg: 650 mg | ORAL | @ 15:00:00 | Stop: 2021-11-28

## 2021-11-28 MED ADMIN — diphenhydrAMINE (BENADRYL) capsule/tablet 25 mg: 25 mg | ORAL | @ 15:00:00 | Stop: 2021-11-28

## 2021-11-28 NOTE — Unmapped (Signed)
Patient presents for [facility] infusion in no acute distress. Vital signs stable for infusion. Patient reports no new medical issues or recent infections requiring antibiotics. PIV started without difficulty. See MAR for premedications administered.     1122 [facility] Infusion mg started infusing per therapy plan order at the following rates:    69mL/hr X and then mL/hr for the remainder of the infusion.   22mL/hr X  53mL/hr X  52mL/hr X  11mL/hr X  240mL/hr for the remainder of the infusion.     1330 [facility] Infusion complete. Vital signs stable and patient does not complain of any signs or symptoms of adverse reaction.  PIV flushed and dc'd per protocol.Patient discharged from clinic in no acute distress.

## 2021-12-01 DIAGNOSIS — R0789 Other chest pain: Secondary | ICD-10-CM | POA: Diagnosis not present

## 2021-12-01 DIAGNOSIS — R0602 Shortness of breath: Secondary | ICD-10-CM | POA: Diagnosis not present

## 2021-12-01 DIAGNOSIS — F319 Bipolar disorder, unspecified: Secondary | ICD-10-CM | POA: Diagnosis not present

## 2021-12-01 DIAGNOSIS — Z881 Allergy status to other antibiotic agents status: Secondary | ICD-10-CM | POA: Diagnosis not present

## 2021-12-01 DIAGNOSIS — R42 Dizziness and giddiness: Secondary | ICD-10-CM | POA: Diagnosis not present

## 2021-12-01 DIAGNOSIS — H5789 Other specified disorders of eye and adnexa: Secondary | ICD-10-CM | POA: Diagnosis not present

## 2021-12-01 DIAGNOSIS — Z91018 Allergy to other foods: Secondary | ICD-10-CM | POA: Diagnosis not present

## 2021-12-01 DIAGNOSIS — Z20822 Contact with and (suspected) exposure to covid-19: Secondary | ICD-10-CM | POA: Diagnosis not present

## 2021-12-01 DIAGNOSIS — D72829 Elevated white blood cell count, unspecified: Secondary | ICD-10-CM | POA: Diagnosis not present

## 2021-12-01 DIAGNOSIS — F988 Other specified behavioral and emotional disorders with onset usually occurring in childhood and adolescence: Secondary | ICD-10-CM | POA: Diagnosis not present

## 2021-12-01 DIAGNOSIS — R110 Nausea: Secondary | ICD-10-CM | POA: Diagnosis not present

## 2021-12-01 DIAGNOSIS — R079 Chest pain, unspecified: Secondary | ICD-10-CM | POA: Diagnosis not present

## 2021-12-01 DIAGNOSIS — R457 State of emotional shock and stress, unspecified: Secondary | ICD-10-CM | POA: Diagnosis not present

## 2021-12-01 DIAGNOSIS — H1011 Acute atopic conjunctivitis, right eye: Secondary | ICD-10-CM | POA: Diagnosis not present

## 2021-12-01 DIAGNOSIS — Z885 Allergy status to narcotic agent status: Secondary | ICD-10-CM | POA: Diagnosis not present

## 2021-12-01 DIAGNOSIS — L299 Pruritus, unspecified: Secondary | ICD-10-CM | POA: Diagnosis not present

## 2021-12-01 DIAGNOSIS — Z79899 Other long term (current) drug therapy: Secondary | ICD-10-CM | POA: Diagnosis not present

## 2021-12-04 ENCOUNTER — Encounter: Payer: Self-pay | Admitting: Family Medicine

## 2021-12-04 ENCOUNTER — Ambulatory Visit (INDEPENDENT_AMBULATORY_CARE_PROVIDER_SITE_OTHER): Payer: BC Managed Care – PPO | Admitting: Family Medicine

## 2021-12-04 ENCOUNTER — Telehealth: Payer: Self-pay | Admitting: *Deleted

## 2021-12-04 VITALS — BP 95/70 | HR 109 | Temp 98.0°F | Ht 61.0 in | Wt 193.2 lb

## 2021-12-04 DIAGNOSIS — F5104 Psychophysiologic insomnia: Secondary | ICD-10-CM | POA: Diagnosis not present

## 2021-12-04 DIAGNOSIS — R5383 Other fatigue: Secondary | ICD-10-CM

## 2021-12-04 DIAGNOSIS — E559 Vitamin D deficiency, unspecified: Secondary | ICD-10-CM

## 2021-12-04 DIAGNOSIS — E538 Deficiency of other specified B group vitamins: Secondary | ICD-10-CM | POA: Diagnosis not present

## 2021-12-04 NOTE — Patient Instructions (Signed)
For sleep:  Melatonin 10 mg:  take the same time every night, 1 hour before bed  2.   Take the trazodone 50 mg tablets at night right now, 1-2 tablets before bed

## 2021-12-04 NOTE — Progress Notes (Signed)
Courtney Burack T. Alfreddie Consalvo, MD, [place] at [facility] [address] [place], [address]  Phone: [phone]  FAX: [phone]  Courtney [name] - 31 y.o. female  MRN [id number]  Date of Birth: 1990/04/13  Date: 12/04/2021  PCP: Lesleigh Noe, MD  Referral: Lesleigh Noe, MD  Chief Complaint  Patient presents with   Follow-up    ER visit on Friday in [place] Since then, she states she can't stay awake   Subjective:   Courtney [name] is a 31 y.o. very pleasant female patient with Body mass index is 36.51 kg/m. who presents with the following:  Pleasant young lady with a history of bipolar illness, and she also has a history of recent PE and is on Eliquis.  She comes in with a number of different complaints, but the primary reason she is then is that she is very tired and has been sleeping much of the time since she was released from the emergency room on Friday.  On secondary questioning, she has been tired and sleepy throughout the day, she actually has not been sleeping at night really much at all.  Friday, morning to then [facility] to Friday evening. 2 different visits.  Most recently, she was evaluated for chest pain at the emergency room in [facility], and they did a cardiac evaluation with negative troponins and negative EKG.  Friday night, feels very congested - nothing has changed with that until July.  This is not new, and has been present for months.  Remicade from hidradenitis.   On Eliquis for PE.  ESR and CRP elevated on 9/26 were elevated.   Cosyntix 5 weeks, then this is the next dose a month later.   Vit D B12  Worried about everything since Jul 17, 2022 - almost died.  She had a septic event, and since then she has been worried about everything in her life as well as her own personal health.  Lives with Mom.   She has missed multiple doses of her psychiatric medicines in the last month.  Review of  Systems is noted in the HPI, as appropriate  Patient Active Problem List   Diagnosis Date Noted   Pulmonary embolism ([name]) 07/19/2021   Hypotension 07/11/2021   Trichomonas vaginitis 42/70/6237   Acute metabolic encephalopathy 62/83/1517   Bilateral pulmonary embolism ([place]) 07/05/2021   Anxiety disorder 06/28/2021   Prediabetes 05/25/2021   Allergy to adhesive tape 05/25/2021   Hidradenitis suppurativa 05/23/2021   Insomnia 03/27/2021   Anemia 12/21/2020   Hypoglycemia 11/17/2020   Bipolar disorder, in full remission, most recent episode mixed ([place]) 10/07/2020   Bipolar disorder, in full remission, most recent episode depressed ([place]) 03/03/2020   Bipolar 2 disorder, major depressive episode ([place]) 08/04/2019   At risk for long QT syndrome 08/04/2019   Obesity (BMI 35.0-39.9 without comorbidity) 03/19/2019   Reactive airway disease 02/11/2019   Conductive hearing loss 03/13/2018   Axillary hidradenitis suppurativa 04/03/2017   Vitamin D deficiency 10/18/2014   Attention deficit hyperactivity disorder (ADHD), predominantly hyperactive type 02/11/2012    Past Medical History:  Diagnosis Date   ADHD (attention deficit hyperactivity disorder)    Bipolar disorder ([name])    Chronic tonsillitis 08/2013   current strep, will finish antibiotic 09/04/2013; snores during sleep, mother denies apnea   Family history of adverse reaction to anesthesia    " My cousin had problems waking up. "   Heart murmur    "when  I was born; outgrew it; I was a preemie"   Hidradenitis    Hidradenitis 2017   Migraine 12/19/2016   "I've just had this one for 3 days" (12/20/2016)   Septic shock ([place]) 07/02/2021    Past Surgical History:  Procedure Laterality Date   AXILLARY HIDRADENITIS EXCISION     AXILLARY LYMPH NODE BIOPSY Left 01/06/2019   CYSTOSCOPY W/ URETERAL STENT PLACEMENT Right 05/28/2017   Procedure: CYSTOSCOPY WITH RETROGRADE PYELOGRAM/ RIGHT URETERAL STENT PLACEMENT;  Surgeon: Ardis Hughs, MD;  Location: [facility];  Service: Urology;  Laterality: Right;   HYDRADENITIS EXCISION Bilateral 04/03/2017   Procedure: EXCISION AND DRAINAGE BILATERAL  HIDRADENITIS AXILLA;  Surgeon: Jovita Kussmaul, MD;  Location: [facility];  Service: General;  Laterality: Bilateral;   INCISION AND DRAINAGE ABSCESS Right 07/20/2016   Procedure: INCISION AND DRAINAGE ABSCESS right axilla;  Surgeon: Coralie Keens, MD;  Location: [facility] OR;  Service: General;  Laterality: Right;   INTRAUTERINE DEVICE (IUD) INSERTION  11/2016   "had the one in my left arm removed"   [place] Right 12/21/2016   Procedure: IRRIGATION AND DEBRIDEMENT RIGHT AXILLARY HIDRADENITIS;  Surgeon: Ileana Roup, MD;  Location: [facility];  Service: General;  Laterality: Right;   IRRIGATION AND DEBRIDEMENT ABSCESS Left 01/08/2017   Procedure: IRRIGATION AND DEBRIDEMENT AXILLARY ABSCESS;  Surgeon: Autumn Messing III, MD;  Location: [facility];  Service: General;  Laterality: Left;   NASAL SEPTOPLASTY W/ TURBINOPLASTY Bilateral 02/25/2019   Procedure: NASAL SEPTOPLASTY WITH TURBINATE REDUCTION;  Surgeon: Izora Gala, MD;  Location: [facility] [place];  Service: ENT;  Laterality: Bilateral;   TONSILLECTOMY Bilateral 09/14/2013   Procedure: BILATERAL TONSILLECTOMY;  Surgeon: Izora Gala, MD;  Location: [facility];  Service: ENT;  Laterality: Bilateral;   TYMPANOPLASTY Bilateral    "rebuilt eardrums"   WISDOM TOOTH EXTRACTION      Family History  Problem Relation Age of Onset   Hypertension Mother    Hyperlipidemia Mother    Diabetes Mother    Hyperlipidemia Father    Heart attack Father 56   Alcohol abuse Paternal Uncle    Parkinson's disease Maternal Grandmother    Dementia Maternal Grandmother    Diabetes Maternal Grandmother    Bell's palsy Maternal Grandmother     Social History   Social History Narrative   02/11/19   From: the area   Living: with Mom currently, working to find her  own place   Work: EMS/Fire      Family: good relationship with Mom and Grandma      Enjoys: spend time with friends/family, hunting, bomb fire      Exercise: every shift does training - 2-3 times a week   Diet: well rounded, meat heavy      Safety   Seat belts: Yes    Guns: Yes  and secure   Safe in relationships: Yes    Helmet: no      Objective:   BP 95/70   Pulse (!) 109   Temp 98 F (36.7 C) (Oral)   Ht _0  (1.549 m)   Wt 193 lb 4 oz (87.7 kg)   SpO2 98%   BMI 36.51 kg/m   GEN: No acute distress; alert,appropriate. PULM: Breathing comfortably in no respiratory distress PSYCH: Normally interactive.  Somewhat labile in appearance and occasionally tearful during examination CV: RRR, no m/g/r  PULM: Normal respiratory rate, no accessory muscle use. No wheezes, crackles or rhonchi  Laboratory and Imaging Data:  Assessment and Plan:     ICD-10-CM   1. Other fatigue  R53.83 Vitamin B12    VITAMIN D 25 Hydroxy (Vit-D Deficiency, Fractures)    TSH    T4, free    T3, free    2. B12 deficiency  E53.8 Vitamin B12    3. Vitamin D deficiency  E55.9 VITAMIN D 25 Hydroxy (Vit-D Deficiency, Fractures)    4. Psychophysiological insomnia  F51.04      Total encounter time: 30 minutes. This includes total time spent on the day of encounter.  Additional time spent on chart review with documentation review in care everywhere as well as discussion of general health, insomnia, as well as psychiatric medication.  Insomnia and fatigue, longstanding with exacerbation in the last few days.  Question if 24-hour shift followed by only 2 hours of sleep the following night contributes to current severe fatigue.  She also has not been sleeping, and certainly this will make someone also quite drowsy.  I think that breaking her insomnia cycle is key to this.  We will have her do some melatonin at nighttime as well as use some of her trazodone which she already has scheduled.  Low-level  B12 and vitamin D deficiency could contribute.  Thyroid normal.  No sign of cardiac pathology and recent chest pain rule out.  Mother is taking an active role in observing her medication compliance.  Medication Management during today's office visit: No orders of the defined types were placed in this encounter.  Medications Discontinued During This Encounter  Medication Reason   ertapenem (INVANZ) 1 g injection    diazepam (VALIUM) 10 MG tablet Completed Course   HYDROcodone-acetaminophen (NORCO/VICODIN) 5-325 MG tablet Completed Course   busPIRone (BUSPAR) 10 MG tablet Patient Preference    Orders placed today for conditions managed today: Orders Placed This Encounter  Procedures   Vitamin B12   VITAMIN D 25 Hydroxy (Vit-D Deficiency, Fractures)   TSH   T4, free   T3, free    Disposition: No follow-ups on file.  Dragon [employer] One speech-to-text software was used for transcription in this dictation.  Possible transcriptional errors can occur using Editor, commissioning.   Signed,  Maud Deed. Jaisa Defino, MD   Outpatient Encounter Medications as of 12/04/2021  Medication Sig   ARIPiprazole (ABILIFY) 2 MG tablet Take 1 tablet (2 mg total) by mouth daily.   bifidobacterium infantis (ALIGN) capsule Take 1 capsule by mouth daily.   clindamycin (CLEOCIN) 300 MG capsule Take 300 mg by mouth 2 (two) times daily.   COSENTYX SENSOREADY, 300 MG, 150 MG/ML SOAJ Inject 2 Syringes into the skin every 28 (twenty-eight) days.   diphenhydrAMINE (BENADRYL) 25 mg capsule 25 mg every 6 (six) hours as needed.   ELIQUIS 5 MG TABS tablet TAKE 1 TABLET BY MOUTH TWICE A DAY   EPINEPHrine (EPIPEN 2-PAK) 0.3 mg/0.3 mL IJ SOAJ injection Inject 0.3 mg into the muscle as needed for anaphylaxis.   glucose blood (CONTOUR TEST) test strip And lancets #100   hydrOXYzine (ATARAX) 25 MG tablet TAKE 1 TABLET BY MOUTH EVERY 8 HOURS AS NEEDED FOR ITCHING.   inFLIXimab (REMICADE IV) Inject 1 Dose into the vein every 30  (thirty) days.   Iron-Vitamin C 100-250 MG TABS Take 1 tablet by mouth daily.   lamoTRIgine (LAMICTAL) 100 MG tablet Take 0.5 tablets (50 mg total) by mouth 2 (two) times daily.   methylphenidate (RITALIN) 20 MG tablet Take 0.5 tablets (10  mg total) by mouth daily as needed.   methylphenidate (RITALIN) 20 MG tablet Take 1 tablet (20 mg total) by mouth daily before breakfast.   methylphenidate (RITALIN) 20 MG tablet Take 1 tablet (20 mg total) by mouth daily before breakfast.   methylphenidate (RITALIN) 20 MG tablet Take 1 tablet (20 mg total) by mouth daily before breakfast.   Microlet Lancets MISC See admin instructions.   montelukast (SINGULAIR) 10 MG tablet TAKE 1 TABLET BY MOUTH EVERYDAY AT BEDTIME   norethindrone (MICRONOR) 0.35 MG tablet Take 1 tablet by mouth daily.   ondansetron (ZOFRAN-ODT) 4 MG disintegrating tablet Take 1 tablet (4 mg total) by mouth every 8 (eight) hours as needed for nausea or vomiting.   traMADol (ULTRAM) 50 MG tablet Take 1 tablet (50 mg total) by mouth every six (6) hours as needed for pain.   traZODone (DESYREL) 50 MG tablet TAKE 1 TABLET BY MOUTH EVERYDAY AT BEDTIME   [DISCONTINUED] busPIRone (BUSPAR) 10 MG tablet TAKE 1 TABLET BY MOUTH TWICE A DAY (Patient not taking: Reported on 12/04/2021)   [DISCONTINUED] diazepam (VALIUM) 10 MG tablet Take 1 tablet 30 minutes prior to dermatology procedure   [DISCONTINUED] ertapenem (INVANZ) 1 g injection Inject into the vein.   [DISCONTINUED] HYDROcodone-acetaminophen (NORCO/VICODIN) 5-325 MG tablet hydrocodone 5 mg-acetaminophen 325 mg tablet  TAKE 1 TO 2 TABLETS BY MOUTH EVERY 6 HOURS AS NEEDED FOR PAIN   No facility-administered encounter medications on file as of 12/04/2021.

## 2021-12-04 NOTE — Telephone Encounter (Signed)
PLEASE NOTE: All timestamps contained within this report are represented as [place] Standard Time. CONFIDENTIALTY NOTICE: This fax transmission is intended only for the addressee. It contains information that is legally privileged, confidential or otherwise protected from use or disclosure. If you are not the intended recipient, you are strictly prohibited from reviewing, disclosing, copying using or disseminating any of this information or taking any action in reliance on or regarding this information. If you have received this fax in error, please notify us immediately by telephone so that we can arrange for its return to [place]. Phone: [phone], Toll-Free: [phone], Fax: [phone] Page: 1 of 2 Call Id: [id number] [facility] - Client TELEPHONE ADVICE RECORD Access[facility] Patient Name: Courtney [name] Gender: Female DOB: 13-May-1990 Age: 31 Y 11 M 21 D Return Phone Number: [phone] (Primary) Address: City/ State/ Zip: [place]  [address] Client [facility] - Client Client Site [place] - Day Provider Waunita Schooner- MD Contact Type Call Who Is Calling Patient / Member / Family / Caregiver Call Type Triage / Clinical Caller Name Kadasia Kassing Relationship To Patient Mother Return Phone Number [phone] (Primary) Chief Complaint CHEST PAIN - pain, pressure, heaviness or tightness Reason for Call Symptomatic / Request for Health Information Initial Comment Office Transferring Call* for Callback: patient was in the ER last week due to chest pains and now she cant seem to stay awake with chest pains, Caller disconnected from the office before they could transfer call . Translation No Nurse Assessment Nurse: Cherre Robins, RN, Ria Comment Date/Time (Eastern Time): 12/04/2021 10:24:41 AM Confirm and document reason for call. If symptomatic, describe symptoms. ---Caller states she is having  chest pain. States she was seen in ED on Friday for chest pain. Does the patient have any new or worsening symptoms? ---Yes Will a triage be completed? ---Yes Related visit to physician within the last 2 weeks? ---Yes Does the PT have any chronic conditions? (i.e. diabetes, asthma, this includes High risk factors for pregnancy, etc.) ---Yes List chronic conditions. ---preadiabetes Is the patient pregnant or possibly pregnant? (Ask all females between the ages of 33-55) ---No Is this a behavioral health or substance abuse call? ---No Guidelines Guideline Title Affirmed Question Affirmed Notes Nurse Date/Time (Eastern Time) Dizziness - Lightheadedness [1] Numbness (i.e., loss of sensation) of the face, arm / hand, or leg / foot on one side of the body AND Weiss-Hilton, RN, Ria Comment 12/04/2021 10:28:53 AM PLEASE NOTE: All timestamps contained within this report are represented as [place] Standard Time. CONFIDENTIALTY NOTICE: This fax transmission is intended only for the addressee. It contains information that is legally privileged, confidential or otherwise protected from use or disclosure. If you are not the intended recipient, you are strictly prohibited from reviewing, disclosing, copying using or disseminating any of this information or taking any action in reliance on or regarding this information. If you have received this fax in error, please notify us immediately by telephone so that we can arrange for its return to [place]. Phone: [phone], Toll-Free: [phone], Fax: [phone] Page: 2 of 2 Call Id: [id number] Guidelines Guideline Title Affirmed Question Affirmed Notes Nurse Date/Time Eilene Ghazi Time) [2] sudden onset AND [3] brief (now gone) Disp. Time Eilene Ghazi Time) Disposition Final User 12/04/2021 10:19:43 AM Send to Urgent Daron Offer, Lanette 12/04/2021 10:33:24 AM Go to ED Now (or PCP triage) Yes Weiss-Hilton, RN, Ria Comment Final Disposition 12/04/2021 10:33:24  AM Go to ED Now (or PCP triage) Yes Weiss-Hilton, RN,  Augustina Mood Disagree/Comply Comply Caller Understands Yes PreDisposition InappropriateToAsk Care Advice Given Per Guideline GO TO ED NOW (OR PCP TRIAGE): * IF NO PCP (PRIMARY CARE PROVIDER) SECOND-LEVEL TRIAGE: You need to be seen within the next hour. Go to the ED/UCC at _____________ Hospital. Leave as soon as you can. * It is better and safer if another adult drives instead of you. ANOTHER ADULT SHOULD DRIVE: BRING MEDICINES: * Bring a list of your current medicines when you go to the Emergency Department (ER). CARE ADVICE given per Dizziness (Adult) guideline. Comments User: Jovita Kussmaul, RN Date/Time Lamount Cohen Time): 12/04/2021 10:27:10 AM Caller now states she is not having chest at all right now. and feeling very tired. User: Jovita Kussmaul, RN Date/Time Lamount Cohen Time): 12/04/2021 10:28:45 AM States she last had chest pain on Friday. Has not had any chest pain since Friday. User: Jovita Kussmaul, RN Date/Time Lamount Cohen Time): 12/04/2021 10:28:58 AM No fever. User: Jovita Kussmaul, RN Date/Time Lamount Cohen Time): 12/04/2021 10:29:40 AM States she is also having lower back pain by her waist. User: Jovita Kussmaul, RN Date/Time Lamount Cohen Time): 12/04/2021 10:29:52 AM States also feeling light headed Referrals [facility] [place] [facility]

## 2021-12-04 NOTE — Telephone Encounter (Signed)
Patient scheduled for an appointment today with Dr. Lorelei Pont at 3:20 pm.

## 2021-12-05 LAB — T3, FREE: T3, Free: 3.7 pg/mL (ref 2.3–4.2)

## 2021-12-05 LAB — VITAMIN B12: Vitamin B-12: 248 pg/mL (ref 211–911)

## 2021-12-05 LAB — VITAMIN D 25 HYDROXY (VIT D DEFICIENCY, FRACTURES): VITD: 31.63 ng/mL (ref 30.00–100.00)

## 2021-12-05 LAB — T4, FREE: Free T4: 0.63 ng/dL (ref 0.60–1.60)

## 2021-12-05 LAB — TSH: TSH: 1.58 u[IU]/mL (ref 0.35–5.50)

## 2021-12-19 ENCOUNTER — Ambulatory Visit: Admit: 2021-12-19 | Discharge: 2021-12-20 | Payer: PRIVATE HEALTH INSURANCE

## 2021-12-19 DIAGNOSIS — L408 Other psoriasis: Principal | ICD-10-CM

## 2021-12-19 DIAGNOSIS — L732 Hidradenitis suppurativa: Principal | ICD-10-CM

## 2021-12-19 DIAGNOSIS — Z79899 Other long term (current) drug therapy: Principal | ICD-10-CM

## 2021-12-19 MED ORDER — HYDROCODONE 5 MG-ACETAMINOPHEN 325 MG TABLET
ORAL_TABLET | ORAL | 0 refills | 0.00000 days | Status: CP
Start: 2021-12-19 — End: ?

## 2021-12-19 MED ADMIN — triamcinolone acetonide (KENALOG-40) injection 40 mg: 40 mg | @ 18:00:00 | Stop: 2021-12-19

## 2021-12-19 NOTE — Unmapped (Signed)
Self-reported severity (0-5): 4  VAS pain today: 8  VAS average pain for the last month: 8  Requiring pain medication? Yes.  If so, what type/frequency?Tylenol  How often in pain?  continuously  Level of odor (0-5): 2  Level of itching (0-5): 5  Dressing changes needed for drainage:No drainage/less than once weekly  How much drainage: no drainage  Flare in the last month (Y/N)? No.  How long ago was the last flare? in last 6 months  Developing new lesions? less than monthly  Number of inflammatory lesions montly: 1-3  DLQI: 28  Current treatment: -     How helpful is the current treatment in managing the following aspects of your disease?  Not at all helpful Somewhat helpful Very helpful   Pain        x    Decreasing length of flares        x    Decreasing new lesions  x    Drainage  x    Decreasing frequency of flares  x    Decreasing severity of flares  x    Odor  x

## 2021-12-19 NOTE — Unmapped (Signed)
-----   Message from Freddy Finner sent at 12/19/2021 11:09 AM EDT -----  Regarding: HS follow up  Rosalita Chessman    Patient needs HS follow up for 1st of year with Janyth Contes      Thanks  Aram Beecham

## 2021-12-19 NOTE — Unmapped (Signed)
Dermatology Note     Assessment and Plan:      Hidradenitis Suppurativa [place] Stage III, currently flaring with severe progression leading to risk to physical function with disability  Chronic condition with exacerbation or progression   Locations: axillae (R>L), inframammary, infra-pannus, groin, inner thighs  Previously failed: Humira, Ertapenem X 12 week course completed 06/2021 (complication of PE requiring hospitalization - avoiding PICC lines in future due to this), Spironolactone (non-Derm provider discontinued due to risk of low blood pressure with other medication)  Previous surgeries: unroofing left axilla 06/2021  - Reviewed chronic, waxing/waning nature of condition and associated comorbid conditions (pilonidal cysts, IBD, psoriasis, acne, dissecting cellulitis) and potential for sinus tract formation and scarring. Discussed that combination of medical and surgical therapy is often required for treatment.   - Continue Infliximab 10mg /kg every 4 weeks.   - Continue Cosentyx as below for inverse psoriasis, discussed off-label use for HS  - Continue Clindamycin 300mg  BID. Has several refills available at her pharmacy.   - Start Norco 5-325 q4-6h prn for moderate to severe pain.   - Start triamcinolone ointment. Apply to inflamed areas of HS twice a day until improved, no longer than 2 weeks at a time. Then stop for two weeks. Restart as needed. Patient has this at home, does not need refill.   - Patient would like to pursue surgery under general anesthesia for L inguinal. Previously referred to Dr. Parks Ranger at [facility] for consultation in June 2023 - patient has not heard back. New referral sent to Dr Priscille Loveless [facility].  - Joint decision to treat most painful areas with intra-lesional kenalog, see below  Intralesional Kenalog Procedure Note: After the patient was informed of risks (including atrophy and dyspigmentation), benefits and side effects of intralesional steroid injection, the patient elected to undergo injection and verbal consent was obtained. Skin was cleaned with alcohol and injected intralesionally into the sites (below). The patient tolerated the procedure well without complications and was instructed on post-procedure care.  Location(s): Bilateral groin  Number of sites treated: 2  Kenalog (triamcinolone) Concentration: 40 mg/ml   Volume: 0.7 ml total    Inverse psoriasis: Improving, bilateral groin   - Continue Cosentyx 300mg  every 28 days. Side effects discussed.     High risk medication use, infliximab and starting cosentyx  Last Quant Gold 10/2021 negative.   - Monitoring labs every 4 weeks with infusions    The patient was advised to call for an appointment should any new, changing, or symptomatic lesions develop.     RTC: 3-4 months  _________________________________________________________________      Chief Complaint     Follow-up of HS    HPI     Melissa Pearson is a 31 y.o. female who presents as a returning patient (last seen by Dr. Janyth Contes on 08/14/2021 ) to [facility] for follow-up of hidradenitis suppurativa.      At [facility], started Cosentyx, continued infliximab 10mg /kg 14w. Referred to Dr Parks Ranger for L inguinal excision.     Today she reports:   - Continues on infliximab and started Cosentyx; most HS lesions are improved on this regimen, but bilateral groin flares have been persistent and worsening. Pain from these flares is significantly limiting her ability to function during the day and sleep - tramadol ineffective.   - Inverse psoriasis better with Cosentyx  - Switched from Bactrim to Clindamycin and noticed more benefit; continues on Clindamycin currently  - No new  areas of involvement    Self-reported severity (0-5): 4  VAS pain today: 8  VAS average pain for the last month: 8  Requiring pain medication? Yes.  If so, what type/frequency?Tylenol  How often in pain?  continuously  Level of odor (0-5): 2  Level of itching (0-5): 5  Dressing changes needed for drainage:No drainage/less than once weekly  How much drainage: no drainage  Flare in the last month (Y/N)? No.  How long ago was the last flare? in last 6 months  Developing new lesions? less than monthly  Number of inflammatory lesions montly: 1-3  DLQI: 28  Current treatment: See above  How helpful is the current treatment in managing the following aspects of your disease?  Not at all helpful Somewhat helpful Very helpful   Pain        x    Decreasing length of flares        x    Decreasing new lesions  x    Drainage  x    Decreasing frequency of flares  x    Decreasing severity of flares  x    Odor  x       The patient denies any other new or changing lesions or areas of concern.     Pertinent Past Medical History     No history of skin cancer    Family History:   Negative for melanoma    Past Medical History, Family History, Social History, Medication List, Allergies, and Problem List were reviewed in the rooming section of Epic.     ROS: Other than symptoms mentioned in the HPI, no fevers, chills, or other skin complaints    Physical Examination     Gen: Well-appearing but tearful patient, appropriate, interactive, in no acute distress  Skin: Examination of the scalp, face, neck, chest, back, abdomen, bilateral upper and lower extremities, hands, palms, soles, nails, buttocks, and external genitalia performed today and pertinent for:     location Abscess Inflamed nodule Non-inflamed nodule Draining tunnel Non-draining tunnel Hurley % scar   R axilla  1  1 2      L axilla       100   R inframammary          L inframammary          Intermammary  1        Pubic          R inguinal  1  1      R thigh          L inguinal  2  1      L thigh          Scrotum/Vulva          Perianal          R buttock          L buttock          Other (list)   Abdomen  1  1                  Inverse psoriasis: Erythema, maceration in bilateral inguinal folds with resolution of prior fissures     AN count (total sum of abscess and inflammatory nodule): 6  Pilonidal sinus? no

## 2021-12-19 NOTE — Unmapped (Signed)
[facility] Refill Coordination Note    Melissa Pearson, [place]: 25-Jul-1990  Phone: [phone] (home) [phone] (work)      All above HIPAA information was verified with patient.         12/19/2021     9:45 AM   Specialty Rx Medication Refill Questionnaire   Which Medications would you like refilled and shipped? Cosentyx   Please list all current allergies: Morphine   Have you missed any doses in the last 30 days? No   Have you had any changes to your medication(s) since your last refill? No   How many days remaining of each medication do you have at home? 0   If receiving an injectable medication, next injection date is 12/28/2021   Have you experienced any side effects in the last 30 days? No   Please enter the full address (street address, city, state, zip code) where you would like your medication(s) to be delivered to. [phone]   Please specify on which day you would like your medication(s) to arrive. Note: if you need your medication(s) within 3 days, please call the pharmacy to schedule your order at [phone]  12/25/2021   Has your insurance changed since your last refill? No   Would you like a pharmacist to call you to discuss your medication(s)? No   Do you require a signature for your package? (Note: if we are billing Medicare Part B or your order contains a controlled substance, we will require a signature) No         Completed refill call assessment today to schedule patient's medication shipment from the [facility] [phone]).  All relevant notes have been reviewed.       Confirmed patient received a Conservation officer, historic buildings and a Surveyor, mining with first shipment. The patient will receive a drug information handout for each medication shipped and additional FDA Medication Guides as required.         REFERRAL TO PHARMACIST     Referral to the pharmacist: Not needed      [facility]     Shipping address confirmed in Epic.     Delivery Scheduled: Yes, Expected medication delivery date: 10/24.     Medication will be delivered via UPS to the prescription address in Epic WAM.    Melissa Pearson   [facility] Specialty Technician

## 2021-12-20 NOTE — Unmapped (Signed)
I saw and evaluated the patient, participating in the key elements of the service.  I discussed the findings, assessment and plan with the resident and agree with resident’s findings and plan as documented in the resident's note.  I was immediately available for the entirety of the procedure(s) and present for the key and critical portions. Temisha Murley J Cannie Muckle, MD

## 2021-12-20 NOTE — Unmapped (Signed)
You are welcome to join the [facility] [place] Support group online at [email or web address] or in-person at our regular meetings.  You can textHS to [phone] for meeting reminders or join the group online for regular updates.  This can be a great opportunity to interact and learn from other patients and help work with the HS community.  We hope to see your there!    Hidradentis Suppurativa (pronounced ???high-drad-en-eye-tis/sup-your-uh-tee-vah???) is a chronic disease of hair follicles.  The lesions occur most commonly on areas of skin-to-skin contact: under the arms (axillary area), in the groin, around the buttocks, in the region around the anus and genitals, and on the skin between and under the breasts. In women, the underarms, groin, and breast areas are most commonly affected. Men most often have HS lesions on the buttocks and under the arms and may also have HS at the back of the neck and behind and around the ears.    What does HS look and feel like?   The first thing that someone with HS notices is a tender, raised, red bump that looks like an under-the-skin pimple or boil. Sometimes HS lesions have two or more ???heads.???  In mild disease only an occasional boil or abscess may occur, but in more active disease there can be many new lesions every month.  Some abscesses can become larger and may open and drain pus.  Bleeding and increased odor can also occur. In severe disease, deeper abscesses develop and may connect with each other under the skin to form tunnel-like tracts (sinuses, fistulas).  These may drain constantly, or may temporarily improve and then usually begin draining again over time.  In people who have had sinus tracts for some time, scars form that feel like ropes under the skin. In the very worst cases, networks of sinus tracts can form deeper in the body, including the muscle and other tissues. Many people with severe HS have scars that can limit their ability to freely move their arms or legs, though this is very unlikely for most patients.     Clinicians usually classify or ???grade??? HS using the Hurley staging system according to the severity of the disease for each body location:   Hurley stage I: one or more abscesses are present, but no sinus tracts have formed and no scars have developed   Hurley stage II: one or more abscesses are present that resolve and recur; on sinus tract can be present and scarring is seen   Hurley stage III: many abscesses and more than one sinus tract is present with extensive scars.    What causes HS?  The cause of HS is not completely understood.  It seems to be a disorder of hair follicles and often many family members are affected so genetics probably play a strong role.  Bacteria are often present and may make the disease worse, but infection does not seem to be the main cause. Hormones are also likely play a role since the condition typically starts around puberty when hair follicles under the arms and in the groin start to change.  It can sometimes flare with menstrual cycles in women as well.  In most cases it lasts for decades and starts to improve to some extent in the late 30s and 40s as long as many fistulas have not already formed.  Women are three times more likely than men to develop HS.    Other factors are known to contribute to HS   flaring or becoming worse, though they are likely not the main causes. The factors most commonly associated with HS include:   Cigarette smoking - Stopping smoking will likely not cure the disease, but likely is helpful in reducing how much and how often it flares and may prevent it from getting as bad over time.   Higher weight - HS may occur even in people that are not overweight, but it is much more common in patients that are.  There is some evidence that losing weight and eating a diet low in sugars and fats may be helpful in improving hidradenitis, though this is not helpful for everyone.  Working with a nutritionist may be an important way to help with this and is something your physician can help coordinate    Hidradenitis is not contagious.  It is not caused by a problem with personal hygiene or any other activity or behavior of those with the disease.    How can your doctor help you treat your hidradenitis?  Clinicians use both medication and surgery to treat HS. The choice of treatment--or combination of treatments--is made according to an individual patient???s needs. Clinicians consider several factors in determining the most appropriate plan for therapy:   Severity of disease - medications and some laser treatments are usually able to control disease best when fistulas are not present.  Fistulas typically require surgery.   Extent and location of disease   Chronicity (how often the lesions recur)    A number of different surgical methods have been developed that are useful for certain patients under particular circumstances. These can be done with local numbing and healing at home for some areas when disease is not too extensive with relatively brief recovery times.  In more extensive disease there may be a need for larger excisions under general anesthesia with healing time in the hospital and prolonged recovery periods for better disease control.      In addition, many medical treatments have been tried--some with more success than others. No medication is effective for all patients, and you and your doctor may have to try several different treatments or combinations of treatments before you find the treatment plan that works best for you.  The goals of therapy with medications that are either topical (used on the skin) or systemic (taken by mouth) are:  1. to clear the lesions or at least reduce their number and extent, and  2. to prevent new lesions from forming.  3. To reduce pain, drainage, and odor  Some of the types of medications commonly used are antibacterial skin washes and the topical antibiotics to prevent secondary infections and corticosteroid injections into the lesions to reduce inflammation.     Other medications that may be used include retinoids (similar to Accutane), drugs that effect how hormones and hair follicles interact, drugs that affect your immune system (such as methotrexate, adalimumab/Humira, and Remicaid/infliximab), steroids, and oral antibiotics.    Lasers that destroy hair follicles can also be helpful since they reduce the hair follicles that cause the problems.  Multiple treatments are typically required over time and there is some discomfort associated with treatment, but it is typically very fast and well-tolerated.    It is very important to realize that hidradenitis cannot usually be completely cured with any single medication or surgical procedure.  It is a disease that can be very stubborn and difficult to control, but with good treatment a lot of improvement and sometimes temporary remissions can be   obtained. Poorly controlled disease can cause more fistulas to form and make managing the disease much more difficult over time so it is important to seek care to reduce major flares.  Surgery can provide a long term cure in some areas, though the disease can start again or continue in nearby areas.  A dermatologist is often the best person to help coordinate disease treatment, and sometimes other surgeons, pain specialists, other specialists, and nutritionists may be part of the treatment team.    For severe disease, the first goal is often to reduce pain and symptoms with medicines so that the disease feels more stable. Once it's stable, we often start thinking about how to address areas that have completely gotten better with surgery if they are still causing problems.    What can you do to help your HS?  1. Stopping smoking is hard and may not fix everything, but it may be a step in the right direction.  We or your primary care physician can provide resources to help stop if you are interested.  2. Follow a healthy diet and try to achieve a healthy weight.  Some other self-help measures are:   Keep your skin cool and dry (becoming overheated and sweating can contribute to an HS flare)   To reduce the pain of cysts or nodules or to help them to drain, apply hot compresses or soak in hot water for 10 minutes at a time (use a clean washcloth or a teabag soaked in hot water)   For female patients, cotton underwear that does not have tight elastic in the groin can be helpful.  Boyshort, brief, or boxer style underwear may be a better option as friction on hair follicles in affected areas can be a major trigger in some patients.  These can be easily found on Amazon or with some retailers.  Fruit of the Loom and Underworks are two brands that are sometimes recommended.    Finally, know that you are not alone. Coping with the pain and other symptoms of HS can be very difficult, so it may be helpful to connect with others who live with HS. Patient groups and networks can be sources of important information and support. Some internet resources for information and connections are provided below.    Psychologytoday.com is a resource to find psychologists and therapists that can help support you in your are     AASECT.org can help connect with sexual health resources and counselors    Resources for Information    The Hidradenitis Suppurativa Foundation: A nonprofit organized by a group of physicians interested in treating and advancing research in hidradenitis suppurativa.  This group advocates for better care and research for hidradenitis and has educational materials put together specifically for patients that have been reviewed and produced by doctors and people with hidradenitis.    American Academy of Dermatology  http://www.aad.org/dermatology-a-to-z/diseases-and-treatments/e---h/hidradenitis-suppurativa/signs-and-symptoms    National Library of Medicine  http://www.nlm.nih.gov/medlineplus/hidradenitissuppurativa.html  NORD: National Organization for Rare Disorders, Inc  https://www.rarediseases.org/rare-disease-information/rare-diseases/byID/358/viewAbstract  Trials of new medications for HS  Https://www.clinicaltrials.gov

## 2021-12-25 MED FILL — COSENTYX PEN 300 MG/2 PENS (150 MG/ML) SUBCUTANEOUS: SUBCUTANEOUS | 28 days supply | Qty: 2 | Fill #1

## 2021-12-26 ENCOUNTER — Ambulatory Visit: Admit: 2021-12-26 | Discharge: 2021-12-27 | Payer: PRIVATE HEALTH INSURANCE

## 2021-12-26 DIAGNOSIS — L732 Hidradenitis suppurativa: Secondary | ICD-10-CM | POA: Diagnosis not present

## 2021-12-26 MED ADMIN — diphenhydrAMINE (BENADRYL) capsule/tablet 25 mg: 25 mg | ORAL | @ 16:00:00 | Stop: 2021-12-26

## 2021-12-26 MED ADMIN — inFLIXimab-axxq (AVSOLA) 10 mg/kg = 900 mg in sodium chloride (NS) 250 mL IVPB: 10 mg/kg | INTRAVENOUS | @ 16:00:00 | Stop: 2021-12-26

## 2021-12-26 MED ADMIN — cetirizine (ZyrTEC) tablet 10 mg: 10 mg | ORAL | @ 16:00:00 | Stop: 2021-12-26

## 2021-12-26 MED ADMIN — acetaminophen (TYLENOL) tablet 650 mg: 650 mg | ORAL | @ 16:00:00 | Stop: 2021-12-26

## 2021-12-26 NOTE — Unmapped (Signed)
Patient presents for standard Avsola (infliximab-axxq) infusion.  In no acute distress. Vitals stable.  Reports no new medical issues or S/S of infection.  PIV placed.  See MAR for premeds.    1143 Avsola (infliximab-axxq) 900 mg infusing as follows:     54ml/hr x 15 min  71ml/hr x 15 min  35ml/hr x 15 min  14ml/hr x 15 min  150ml/hr x 30 min  222ml/hr for the remainder of the infusion.    1345 Remicade (infliximab) infusion complete.  PIV flushed with NS.  Vitals stable.  Patient without any s/s of adverse reaction.    IV d/c'd.  Patient discharged from [facility]fusion [facility].

## 2022-01-11 ENCOUNTER — Telehealth: Payer: Self-pay | Admitting: Family Medicine

## 2022-01-11 MED ORDER — METHYLPHENIDATE HCL 20 MG PO TABS: 20.0000 mg | ORAL_TABLET | Freq: Every day | ORAL | 0 refills | Status: DC

## 2022-01-11 NOTE — Telephone Encounter (Signed)
Month supply sent to pharmacy

## 2022-01-11 NOTE — Telephone Encounter (Signed)
Patient mother called and asked if patient can get a refill on methylphenidate (RITALIN) 20 MG tablet , patient is out and appointment to see Susy Frizzle is not until 02/15/2022. Patient mother would like a call back.

## 2022-01-11 NOTE — Telephone Encounter (Signed)
Patient's mom advised 

## 2022-01-11 NOTE — Telephone Encounter (Signed)
Matt please review, you are booking up with TOC 40 mimute appointments and most likely the reason for the date been 02/15/22

## 2022-01-15 ENCOUNTER — Other Ambulatory Visit: Payer: Self-pay

## 2022-01-15 ENCOUNTER — Ambulatory Visit: Admit: 2022-01-15 | Discharge: 2022-01-16 | Payer: PRIVATE HEALTH INSURANCE

## 2022-01-15 DIAGNOSIS — L732 Hidradenitis suppurativa: Principal | ICD-10-CM

## 2022-01-15 DIAGNOSIS — Z91048 Other nonmedicinal substance allergy status: Secondary | ICD-10-CM

## 2022-01-15 NOTE — Telephone Encounter (Signed)
Patient has TOC with Matt in December

## 2022-01-15 NOTE — Unmapped (Signed)
Surgery Consult Note         Assessment:     Hidradenitis suppurativa, history of bilateral axillary excisions presents for bilateral groin, left greater than right disease, predominantly on the inner thigh with request from the patient in dermatology for excision/unroofing of these areas.          Plan:     We will plan for surgical unroofing and excision of any areas of grouped nodules in the operating room under general anesthesia.  We discussed the perioperative expectations and wound care expectations.  We discussed risks including but not limited to bleeding, infection, the likelihood of at least some type of recurrence, and damage to surrounding structures.  We discussed that we may close these partially but generally will not close the wound substantially and not completely.  She wishes to proceed.    We will schedule this after her 57-month Eliquis treatment for provoked DVT is complete.        History of Present Illness:    Reason for Consult:  Melissa Pearson is seen in consultation for left groin hidradenitis at the request of Selena Batten Howell Rucks, MD for evaluation of hidradenitis suppurativa.      HPI:   This is a pleasant 31 year old female who has a personal history of hidradenitis suppurativa on medications including including Eliquis for DVTs and infusional therapies as well as topical medication and oral antibiotics for hidradenitis suppurativa.  She is being managed by the dermatology team at [facility] and was referred for procedure under general anesthesia.  The patient has a history of wide excisions of areas in her right and left axilla.  She currently has active disease more present in her left and some in her right groin.  She would like to have these areas resected.    She has a history of sepsis with a PICC line previously and had a DVT secondary to this and is currently on Eliquis completing a 36-month course      Allergies    Adhesive tape-silicones, Ginger, Other, Shellfish derived, Tapentadol, Amoxicillin-pot clavulanate, Opioids - morphine analogues, Iodine, and Vancomycin      Medications      Current Outpatient Medications   Medication Sig Dispense Refill    albuterol HFA 90 mcg/actuation inhaler ProAir HFA 90 mcg/actuation aerosol inhaler      apixaban (ELIQUIS) 5 mg Tab Take 1 tablet (5 mg total) by mouth two (2) times a day.      ARIPiprazole (ABILIFY) 2 MG tablet Take 1 tablet (2 mg total) by mouth.      Bifidobacterium infantis (ALIGN ORAL) Take by mouth.      busPIRone (BUSPAR) 10 MG tablet Take 1 tablet (10 mg total) by mouth.      clindamycin (CLEOCIN) 300 MG capsule TAKE 1 CAPSULE BY MOUTH TWO TIMES A DAY. 63 capsule 11    diazePAM (VALIUM) 10 MG tablet Take 1 tablet 30 minutes prior to dermatology procedure 1 tablet 0    diphenhydrAMINE (BENADRYL) 25 mg capsule/tablet Take 1 each (25 mg total) by mouth every six (6) hours as needed for itching.      empty container Misc Use as directed to dispose of Cosentyx pens. 1 each 2    EPINEPHrine (EPIPEN) 0.3 mg/0.3 mL injection INJECTAS DIRECTED AS NEEDED FOR SYSTEMIC REACTIONS  1    erythromycin (ROMYCIN) 5 mg/gram (0.5 %) ophthalmic ointment PLEASE SEE ATTACHED FOR DETAILED DIRECTIONS      HYDROcodone-acetaminophen (NORCO) 5-325 mg per tablet Take  1 tablet by mouth every four to six hours as needed for moderate to severe pain 15 tablet 0    hydrOXYzine (ATARAX) 25 MG tablet Take 1 tablet (25 mg total) by mouth every eight (8) hours as needed for itching, allergies or anxiety. 20 tablet 0    infliximab (REMICADE IV) Remicade      iron,carbonyl-vitamin C 100-250 mg Tab Take 1 tablet by mouth daily.      lamoTRIgine (LAMICTAL) 100 MG tablet Take 0.5 tablets (50 mg total) by mouth.      methylphenidate HCl (RITALIN) 10 MG tablet TAKE 1 TABLET BY MOUTH THREE TIMES A DAY      methylphenidate HCl (RITALIN) 20 MG tablet Take 1 tablet (20 mg total) by mouth daily.      montelukast (SINGULAIR) 10 mg tablet TAKE 1 TABLET AT BEDTIME      norethindrone (MICRONOR) 0.35 mg tablet Take 1 tablet by mouth daily.      olopatadine (PATANOL) 0.1 % ophthalmic solution PLACE 1 DROP INTO THE RIGHT EYE TWICE A DAY (NOT COVERED OTC)      ondansetron (ZOFRAN-ODT) 4 MG disintegrating tablet Take 1 tablet (4 mg total) by mouth.      secukinumab (COSENTYX PEN, 2 PENS,) 150 mg/mL PnIj injection Inject the contents of 2 pens (300 mg total) once weekly at weeks 0, 1, 2, 3, and 4. As loading dose. 10 mL 0    secukinumab (COSENTYX PEN, 2 PENS,) 150 mg/mL PnIj injection Inject the contents of 2 pens (300 mg total) under the skin every twenty-eight (28) days. Maintenance dose. 2 mL 11    sulfamethoxazole-trimethoprim (BACTRIM DS) 800-160 mg per tablet Take 1 tablet (160 mg of trimethoprim total) by mouth Two (2) times a day. 60 tablet 3    traMADoL (ULTRAM) 50 mg tablet Take 1 tablet (50 mg total) by mouth every six (6) hours as needed for pain. 15 tablet 0    triamcinolone (KENALOG) 0.1 % ointment Apply topically Two (2) times a day. 454 g 5     Current Facility-Administered Medications   Medication Dose Route Frequency Provider Last Rate Last Admin    ertapenem Pincus Sanes) injection 1 g  1 g Intravenous Once Elsie Stain, MD             Past Medical History    Past Medical History:   Diagnosis Date    Acne     ADHD     Allergic     Disorder of skin or subcutaneous tissue 2018    HS    Keloid     Right ear         Past Surgical History    Past Surgical History:   Procedure Laterality Date    SKIN BIOPSY           Family History    Family History   Problem Relation Age of Onset    Allergy (severe) Mother     Diabetes Mother     Stroke Maternal Grandmother         Bell???s palsy    Melanoma Neg Hx     Basal cell carcinoma Neg Hx     Squamous cell carcinoma Neg Hx          Social History:    Social History     Socioeconomic History    Marital status: Single     Spouse name: None    Number of children: None    Years of education:  None    Highest education level: None   Tobacco Use Smoking status: Former     Types: e-Cigarettes    Smokeless tobacco: Never    Tobacco comments:     Hx of vape use   Vaping Use    Vaping Use: Former   Substance and Sexual Activity    Alcohol use: Yes     Comment: occassionally    Drug use: Never    Sexual activity: Not Currently     Partners: Male     Birth control/protection: Condom, Birth Control Pills   Other Topics Concern    Do you use sunscreen? Yes    Tanning bed use? Yes    Are you easily burned? Yes    Excessive sun exposure? Yes    Blistering sunburns? Yes         Review of Systems    Review of Systems   Constitutional:  Negative for chills and diaphoresis.   Respiratory:  Negative for choking and chest tightness.    Cardiovascular:  Negative for palpitations and leg swelling.   Gastrointestinal:  Negative for anal bleeding, blood in stool and rectal pain.   Endocrine: Negative for cold intolerance, heat intolerance and polydipsia.   Genitourinary:  Negative for dysuria and flank pain.   Musculoskeletal:  Negative for back pain and neck pain.   Skin:  Positive for wound. Negative for color change and rash.   Neurological:  Negative for dizziness, light-headedness and headaches.   Hematological:  Negative for adenopathy. Does not bruise/bleed easily.           Vital Signs    BP 98/62 (BP Site: R Arm, BP Position: Sitting, BP Cuff Size: X-Large)  - Pulse 103  - Resp 18  - Ht 154.9 cm (5' 1)  - Wt 88 kg (194 lb)  - SpO2 99%  - BMI 36.66 kg/m??   Body mass index is 36.66 kg/m??.      Physical Exam    GENERAL: This is a well-developed, well-nourished female, not in acute distress, appears comfortable  HEENT: Head is normocephalic and atraumatic. Extraocular muscles are intact. No scleral icterus. Oral mucosa moist and pink without erythema or exudate.  NECK: Supple, no JVD. No cervical lymphadenopathy.  THYROID: No palpable masses  LUNGS: Normal work of breathing with equal chest wall expansion.  BREASTS: Deferred  HEART: Regular rate and rhythm.  Good peripheral perfusion  ABDOMEN: Soft, nontender, nondistended.  No organomegaly.   No hernia appreciated.  RECTAL EXAM: Deferred  EXTREMITIES: No clubbing, cyanosis or edema noted.  Normal gait.  SKIN:  Normal color, tone and turgor.  Hidradenitis suppurativa noted in the bilateral groin, worse on the inner thighs with the left groin greater than the right.  There is tunneling and patchy areas of chronic erythema and skin thickening.  No current abscess.  NEUROLOGIC: No focal neurologic deficits. Cranial nerves II through XII are grossly intact. Sensation is intact throughout.  Steady gait.  PSYCH:  Normal affect, Alert and Oriented x 3.    Test Results    Imaging:     None      Problem List    Patient Active Problem List   Diagnosis    Hidradenitis suppurativa

## 2022-01-16 DIAGNOSIS — L732 Hidradenitis suppurativa: Principal | ICD-10-CM

## 2022-01-16 MED ORDER — HYDROXYZINE HCL 25 MG PO TABS: ORAL_TABLET | ORAL | 1 refills | Status: DC

## 2022-01-18 DIAGNOSIS — M542 Cervicalgia: Secondary | ICD-10-CM | POA: Diagnosis not present

## 2022-01-18 DIAGNOSIS — R202 Paresthesia of skin: Secondary | ICD-10-CM | POA: Diagnosis not present

## 2022-01-18 DIAGNOSIS — G43109 Migraine with aura, not intractable, without status migrainosus: Secondary | ICD-10-CM | POA: Diagnosis not present

## 2022-01-18 DIAGNOSIS — R200 Anesthesia of skin: Secondary | ICD-10-CM | POA: Diagnosis not present

## 2022-01-18 NOTE — Unmapped (Signed)
The [facility] has made a second and final attempt to reach this patient to refill the following medication:Cosentyx.      We have left voicemails on the following phone numbers: [phone] and [phone], have sent a text message to the following phone numbers: [phone] and [phone] and have sent a Mychart questionnaire..    Dates contacted: 11/10 and 11/16  Last scheduled delivery: 10/24    The patient may be at risk of non-compliance with this medication. The patient should call the [facility] at [phone]  Option 4, then Option 2 (all other specialty patients) to refill medication.    [name]   [facility] Specialty Technician

## 2022-01-18 NOTE — Unmapped (Signed)
[facility] Refill Coordination Note    Specialty Medication(s) to be Shipped:   Inflammatory Disorders: Cosentyx    Other medication(s) to be shipped: No additional medications requested for fill at this time     Melissa Pearson, DOB: [birth year]  Phone: [phone] (home) [phone] (work)      All above HIPAA information was verified with patient's family member, mom .     Was a Nurse, learning disability used for this call? No    Completed refill call assessment today to schedule patient's medication shipment from the [facility] [phone]).  All relevant notes have been reviewed.     Specialty medication(s) and dose(s) confirmed: Regimen is correct and unchanged.   Changes to medications: Melissa Pearson reports no changes at this time.  Changes to insurance: No  New side effects reported not previously addressed with a pharmacist or physician: None reported  Questions for the pharmacist: No    Confirmed patient received a Conservation officer, historic buildings and a Surveyor, mining with first shipment. The patient will receive a drug information handout for each medication shipped and additional FDA Medication Guides as required.       DISEASE/MEDICATION-SPECIFIC INFORMATION        For patients on injectable medications: Patient currently has 0 doses left.  Next injection is scheduled for 01/30/22.    SPECIALTY MEDICATION ADHERENCE     Medication Adherence    Patient reported X missed doses in the last month: 0  Specialty Medication: Cosentyx  Patient is on additional specialty medications: No                                Were doses missed due to medication being on hold? No      REFERRAL TO PHARMACIST     Referral to the pharmacist: Not needed      [facility]     Shipping address confirmed in Epic.     Delivery Scheduled: Yes, Expected medication delivery date: 01/24/22.     Medication will be delivered via UPS to the prescription address in Epic WAM.    Melissa Pearson   [facility] Specialty Technician

## 2022-01-19 ENCOUNTER — Other Ambulatory Visit: Payer: Self-pay | Admitting: Physician Assistant

## 2022-01-19 DIAGNOSIS — R519 Headache, unspecified: Secondary | ICD-10-CM

## 2022-01-19 DIAGNOSIS — G379 Demyelinating disease of central nervous system, unspecified: Secondary | ICD-10-CM

## 2022-01-19 DIAGNOSIS — G4459 Other complicated headache syndrome: Secondary | ICD-10-CM

## 2022-01-19 DIAGNOSIS — Z86718 Personal history of other venous thrombosis and embolism: Secondary | ICD-10-CM

## 2022-01-22 ENCOUNTER — Ambulatory Visit: Admit: 2022-01-22 | Discharge: 2022-01-23 | Payer: PRIVATE HEALTH INSURANCE

## 2022-01-22 DIAGNOSIS — L732 Hidradenitis suppurativa: Secondary | ICD-10-CM | POA: Diagnosis not present

## 2022-01-22 LAB — SEDIMENTATION RATE: ERYTHROCYTE SEDIMENTATION RATE: 114 mm/h — ABNORMAL HIGH (ref 0–20)

## 2022-01-22 LAB — CBC W/ AUTO DIFF
BASOPHILS ABSOLUTE COUNT: 0.1 10*9/L (ref 0.0–0.1)
BASOPHILS RELATIVE PERCENT: 0.9 %
EOSINOPHILS ABSOLUTE COUNT: 0.4 10*9/L (ref 0.0–0.5)
EOSINOPHILS RELATIVE PERCENT: 3 %
HEMATOCRIT: 40.3 % (ref 34.0–44.0)
HEMOGLOBIN: 13.3 g/dL (ref 11.3–14.9)
LYMPHOCYTES ABSOLUTE COUNT: 4.5 10*9/L — ABNORMAL HIGH (ref 1.1–3.6)
LYMPHOCYTES RELATIVE PERCENT: 30.6 %
MEAN CORPUSCULAR HEMOGLOBIN CONC: 32.9 g/dL (ref 32.0–36.0)
MEAN CORPUSCULAR HEMOGLOBIN: 28.5 pg (ref 25.9–32.4)
MEAN CORPUSCULAR VOLUME: 86.6 fL (ref 77.6–95.7)
MEAN PLATELET VOLUME: 7.6 fL (ref 6.8–10.7)
MONOCYTES ABSOLUTE COUNT: 0.8 10*9/L (ref 0.3–0.8)
MONOCYTES RELATIVE PERCENT: 5.5 %
NEUTROPHILS ABSOLUTE COUNT: 8.9 10*9/L — ABNORMAL HIGH (ref 1.8–7.8)
NEUTROPHILS RELATIVE PERCENT: 60 %
PLATELET COUNT: 395 10*9/L (ref 150–450)
RED BLOOD CELL COUNT: 4.66 10*12/L (ref 3.95–5.13)
RED CELL DISTRIBUTION WIDTH: 14.9 % (ref 12.2–15.2)
WBC ADJUSTED: 14.8 10*9/L — ABNORMAL HIGH (ref 3.6–11.2)

## 2022-01-22 LAB — BUN: BLOOD UREA NITROGEN: 7 mg/dL — ABNORMAL LOW (ref 9–23)

## 2022-01-22 LAB — CREATININE
CREATININE: 0.61 mg/dL
EGFR CKD-EPI (2021) FEMALE: >90 mL/min/{1.73_m2} (ref >=60)

## 2022-01-22 LAB — AST: AST (SGOT): 14 U/L (ref <=34)

## 2022-01-22 LAB — ALT: ALT (SGPT): 12 U/L (ref 10–49)

## 2022-01-22 LAB — C-REACTIVE PROTEIN: C-REACTIVE PROTEIN: 27 mg/L — ABNORMAL HIGH (ref <=10.0)

## 2022-01-22 MED ADMIN — acetaminophen (TYLENOL) tablet 650 mg: 650 mg | ORAL | @ 16:00:00 | Stop: 2022-01-22

## 2022-01-22 MED ADMIN — inFLIXimab-axxq (AVSOLA) 10 mg/kg = 900 mg in sodium chloride (NS) 250 mL IVPB: 10 mg/kg | INTRAVENOUS | @ 17:00:00 | Stop: 2022-01-22

## 2022-01-22 MED ADMIN — ondansetron (ZOFRAN) injection 4 mg: 4 mg | INTRAVENOUS | @ 17:00:00 | Stop: 2022-01-22

## 2022-01-22 MED ADMIN — sodium chloride (NS) 0.9 % infusion: 20 mL/h | INTRAVENOUS | @ 17:00:00 | Stop: 2022-01-22

## 2022-01-22 MED ADMIN — cetirizine (ZYRTEC) tablet 10 mg: 10 mg | ORAL | @ 16:00:00 | Stop: 2022-01-22

## 2022-01-22 MED ADMIN — diphenhydrAMINE (BENADRYL) capsule/tablet 25 mg: 25 mg | ORAL | @ 16:00:00 | Stop: 2022-01-22

## 2022-01-22 NOTE — Unmapped (Signed)
Patient presents for standard Avsola (infliximab-axxq) infusion.  In no acute distress. Vitals stable.  Reports no new medical issues or S/S of infection.  PIV placed.  See MAR for premeds.     1129a. Pt states she is anxious and doesn't know why. Also states she is very nauseous.  Paged ordering provider for Zofran per patient request. Said her anxiety is much worse. Patient states she didn't take her bipolar anxiety medication this morning. Mom is going to get it in the car. ADHD and Bipolar Rx taken 1150 a.  12N Zofran IV administered. Patient is more relaxed now. 1210 no nausea. Infusion started.    1143 Avsola (infliximab-axxq) 900 mg infusing as follows:      20ml/hr x 15 min  57ml/hr x 15 min  55ml/hr x 15 min  8ml/hr x 15 min  130ml/hr x 30 min  256ml/hr for the remainder of the infusion.     1345 Remicade (infliximab) infusion complete.  PIV flushed with NS.  Vitals stable.  Patient without any s/s of adverse reaction.     IV d/c'd.  Patient discharged from [facility].

## 2022-01-23 MED FILL — COSENTYX PEN 300 MG/2 PENS (150 MG/ML) SUBCUTANEOUS: SUBCUTANEOUS | 28 days supply | Qty: 2 | Fill #2

## 2022-02-06 ENCOUNTER — Other Ambulatory Visit: Payer: Self-pay | Admitting: Psychiatry

## 2022-02-06 DIAGNOSIS — F418 Other specified anxiety disorders: Secondary | ICD-10-CM

## 2022-02-09 ENCOUNTER — Other Ambulatory Visit: Payer: Self-pay

## 2022-02-09 MED ORDER — MONTELUKAST SODIUM 10 MG PO TABS: ORAL_TABLET | ORAL | 0 refills | Status: DC

## 2022-02-12 ENCOUNTER — Telehealth: Payer: Self-pay | Admitting: Family Medicine

## 2022-02-12 ENCOUNTER — Other Ambulatory Visit: Payer: Self-pay | Admitting: Psychiatry

## 2022-02-12 DIAGNOSIS — L732 Hidradenitis suppurativa: Principal | ICD-10-CM

## 2022-02-12 DIAGNOSIS — F3177 Bipolar disorder, in partial remission, most recent episode mixed: Secondary | ICD-10-CM

## 2022-02-12 MED ORDER — METHYLPHENIDATE HCL 20 MG PO TABS: 20.0000 mg | ORAL_TABLET | Freq: Every day | ORAL | 0 refills | Status: DC

## 2022-02-12 MED ORDER — SULFAMETHOXAZOLE 800 MG-TRIMETHOPRIM 160 MG TABLET
ORAL_TABLET | 3 refills | 0 days
Start: 2022-02-12 — End: ?

## 2022-02-12 NOTE — Telephone Encounter (Signed)
5 day supply written. No further refills until she is seen in office

## 2022-02-12 NOTE — Telephone Encounter (Signed)
Last refill given by [facility]. She has appointment on 12/14 with him as well.

## 2022-02-12 NOTE — Telephone Encounter (Signed)
  Encourage patient to contact the pharmacy for refills or they can request refills through [facility]  Did the patient contact the pharmacy: YES    LAST APPOINTMENT DATE:   NEXT APPOINTMENT DATE:02/15/22 TOC  MEDICATION:methylphenidate (RITALIN) 20 MG tablet   Is the patient out of medication? YES  If not, how much is left?  Is this a 90 day supply: 30  PHARMACY: [id number] [place], [place] - [address] Phone: [phone]  Fax: [phone]      Let patient know to contact pharmacy at the end of the day to make sure medication is ready.  Please notify patient to allow 48-72 hours to process

## 2022-02-12 NOTE — Addendum Note (Signed)
Addended by: Eden Emms on: 02/12/2022 05:07 PM   Modules accepted: Orders

## 2022-02-12 NOTE — Unmapped (Signed)
Refill for Bactrim, please advise

## 2022-02-13 DIAGNOSIS — L732 Hidradenitis suppurativa: Principal | ICD-10-CM

## 2022-02-13 MED ORDER — SULFAMETHOXAZOLE 800 MG-TRIMETHOPRIM 160 MG TABLET
ORAL_TABLET | 3 refills | 0 days
Start: 2022-02-13 — End: ?

## 2022-02-14 NOTE — Unmapped (Signed)
Patient no longer taking per last visit

## 2022-02-15 ENCOUNTER — Encounter: Payer: Self-pay | Admitting: Nurse Practitioner

## 2022-02-15 ENCOUNTER — Ambulatory Visit (INDEPENDENT_AMBULATORY_CARE_PROVIDER_SITE_OTHER): Payer: BC Managed Care – PPO | Admitting: Nurse Practitioner

## 2022-02-15 VITALS — BP 128/80 | HR 108 | Temp 98.7°F | Ht 61.0 in | Wt 191.0 lb

## 2022-02-15 DIAGNOSIS — J4520 Mild intermittent asthma, uncomplicated: Secondary | ICD-10-CM | POA: Diagnosis not present

## 2022-02-15 DIAGNOSIS — J310 Chronic rhinitis: Secondary | ICD-10-CM | POA: Insufficient documentation

## 2022-02-15 DIAGNOSIS — Z91013 Allergy to seafood: Secondary | ICD-10-CM

## 2022-02-15 DIAGNOSIS — L732 Hidradenitis suppurativa: Secondary | ICD-10-CM

## 2022-02-15 DIAGNOSIS — F3181 Bipolar II disorder: Secondary | ICD-10-CM

## 2022-02-15 DIAGNOSIS — Z7689 Persons encountering health services in other specified circumstances: Secondary | ICD-10-CM

## 2022-02-15 DIAGNOSIS — F419 Anxiety disorder, unspecified: Secondary | ICD-10-CM

## 2022-02-15 DIAGNOSIS — F901 Attention-deficit hyperactivity disorder, predominantly hyperactive type: Secondary | ICD-10-CM | POA: Diagnosis not present

## 2022-02-15 DIAGNOSIS — I2699 Other pulmonary embolism without acute cor pulmonale: Secondary | ICD-10-CM

## 2022-02-15 DIAGNOSIS — Z760 Encounter for issue of repeat prescription: Secondary | ICD-10-CM

## 2022-02-15 DIAGNOSIS — E669 Obesity, unspecified: Secondary | ICD-10-CM

## 2022-02-15 MED ORDER — LEVOCETIRIZINE DIHYDROCHLORIDE 5 MG PO TABS: 5.0000 mg | ORAL_TABLET | Freq: Every evening | ORAL | 0 refills | Status: DC

## 2022-02-15 MED ORDER — METHYLPHENIDATE HCL 20 MG PO TABS: 10.0000 mg | ORAL_TABLET | Freq: Every day | ORAL | 0 refills | Status: DC | PRN

## 2022-02-15 MED ORDER — EPINEPHRINE 0.3 MG/0.3ML IJ SOAJ: 0.3000 mg | INTRAMUSCULAR | 0 refills | Status: DC | PRN

## 2022-02-15 MED ORDER — METHYLPHENIDATE HCL 20 MG PO TABS: 20.0000 mg | ORAL_TABLET | Freq: Every day | ORAL | 0 refills | Status: DC

## 2022-02-15 MED ORDER — AZELASTINE HCL 0.1 % NA SOLN: 1.0000 | Freq: Two times a day (BID) | NASAL | 0 refills | Status: DC

## 2022-02-15 NOTE — Assessment & Plan Note (Signed)
Patient currently maintained on methylphenidate 20 mg daily.  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

## 2022-02-15 NOTE — Patient Instructions (Signed)
Nice to see you today I have sent in an allergy pill and nasal spray I want ot see you in 3 months, sooner if you need me

## 2022-02-15 NOTE — Assessment & Plan Note (Signed)
Patient is followed by Dr. Laurence Spates currently maintained on Lamictal and Abilify

## 2022-02-15 NOTE — Assessment & Plan Note (Signed)
States she can have lobster and crab does not track.  Has had anaphylaxis in the past that required EpiPen administration.  Patient requesting a refill today as her EpiPen is expired refill provided did instruct patient if she has used EpiPen she needs to be seen in the nearest emergency department

## 2022-02-15 NOTE — Assessment & Plan Note (Signed)
Has had the "sniffles" for approximately 6 months.  States she was told to stay away from fluticasone nasal spray from her ENT likely because of her septoplasty.  Patient has been using over-the-counter nasal saline without great relief.  States she will use Benadryl which helps some.  Will start patient on second-generation antihistamine levocetirizine and start Astelin nasal spray.

## 2022-02-15 NOTE — Assessment & Plan Note (Signed)
Patient currently followed by [facility] dermatology and is on Cosentyx and Remicade.

## 2022-02-15 NOTE — Progress Notes (Signed)
Established Patient Office Visit  Subjective   Patient ID: Courtney [name], female    DOB: [birth year]  Age: 31 y.o. MRN: [id number]  Chief Complaint  Patient presents with   Establish Care    HPI  PE: provoked earlier this year. States that she was elqius for 6 months and seen heme/onc and was told she did not follow-up any further.  Recently finished the full 6 months of anticoagulation.  Hidradentitis: Dr Janyth Contes [facility] dermatology.  He manages patient's hydrocodone, tramadol, Cosentyx, Remicade.  Vitamin D: Was found to be borderline low at most recent office visit with colleague.  She was instructed to pick up over-the-counter vitamin D supplementation.  Biploar: States that she sees Dr. Laurence Spates.  She manages patient's Lamictal, Abilify.  Patient denies HI/SI/AVH.  ADHD: States 20 a day with 10 prn with nightshifts. Some times she will work night and 24 hours  Epi pen: States iodine, shellfish, and ginger. States that has an epipen and has used it in the past  Migraine: Dr. Marlene Bast for headaches. Just started with [facility].  Patient states she is on the medication that does seem to help with the headaches.  Per patient report it seems to be venlafaxine.  Patient is unsure of the milligram she will reach back out once she figures that out  Pap smear: Dr [facility]. Thinks it was last year  Sniffles: States that she is still having congestion. States that she used simply saline. Cannot use flonase per her ENT. States that she taking benedryl and does help breifly      Review of Systems  Constitutional:  Negative for chills and fever.  Respiratory:  Negative for shortness of breath.   Cardiovascular:  Negative for chest pain.  Gastrointestinal:  Negative for abdominal pain, constipation, diarrhea, nausea and vomiting.       BM daily   Genitourinary:  Negative for dysuria and frequency.  Neurological:  Positive for headaches.  Psychiatric/Behavioral:  Negative  for hallucinations and suicidal ideas.       Objective:     BP 128/80   Pulse (!) 108   Temp 98.7 F (37.1 C) (Oral)   Ht 5\' 1"  (1.549 m)   Wt 191 lb (86.6 kg)   LMP  (LMP Unknown)   SpO2 96%   BMI 36.09 kg/m    Physical Exam Vitals and nursing note reviewed.  Constitutional:      Appearance: Normal appearance.  HENT:     Right Ear: Ear canal and external ear normal.     Left Ear: Ear canal and external ear normal.     Mouth/Throat:     Mouth: Mucous membranes are moist.     Pharynx: Oropharynx is clear.  Eyes:     Extraocular Movements: Extraocular movements intact.     Pupils: Pupils are equal, round, and reactive to light.     Comments: Wears contacts  Cardiovascular:     Rate and Rhythm: Normal rate and regular rhythm.     Pulses: Normal pulses.     Heart sounds: Normal heart sounds.  Pulmonary:     Effort: Pulmonary effort is normal.     Breath sounds: Normal breath sounds.  Musculoskeletal:     Right lower leg: No edema.     Left lower leg: No edema.  Lymphadenopathy:     Cervical: No cervical adenopathy.  Skin:    General: Skin is warm.  Neurological:     General: No focal  deficit present.     Mental Status: She is alert.     Deep Tendon Reflexes:     Reflex Scores:      Bicep reflexes are 2+ on the right side and 2+ on the left side.      Patellar reflexes are 2+ on the right side and 2+ on the left side.    Comments: Bilateral upper and lower extremity strength 5/5  Psychiatric:        Mood and Affect: Mood normal.        Behavior: Behavior normal.        Thought Content: Thought content normal.        Judgment: Judgment normal.      No results found for any visits on 02/15/22.    The ASCVD Risk score (Arnett DK, et al., 2019) failed to calculate for the following reasons:   The 2019 ASCVD risk score is only valid for ages 54 to 23    Assessment & Plan:   Problem List Items Addressed This Visit       Cardiovascular and Mediastinum    Bilateral pulmonary embolism [facility])    Patient has been evaluated by oncology and has completed Eliquis 5 mg twice daily for 6 months.      Relevant Medications   EPINEPHrine (EPIPEN 2-PAK) 0.3 mg/0.3 mL IJ SOAJ injection     Respiratory   Reactive airway disease    Patient states she will use an albuterol inhaler as needed.  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  States she was told to stay away from fluticasone nasal spray from her ENT likely because of her septoplasty.  Patient has been using over-the-counter nasal saline without great relief.  States she will use Benadryl which helps some.  Will start patient on second-generation antihistamine levocetirizine and start Astelin nasal spray.      Relevant Medications   levocetirizine (XYZAL) 5 MG tablet   azelastine (ASTELIN) 0.1 % nasal spray     Musculoskeletal and Integument   Axillary hidradenitis suppurativa   Hidradenitis suppurativa    Patient currently followed by [facility] dermatology and is on Cosentyx and Remicade.        Other   Attention deficit hyperactivity disorder (ADHD), predominantly hyperactive type    Patient currently maintained on methylphenidate 20 mg daily.  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      Relevant Medications   methylphenidate (RITALIN) 20 MG tablet   methylphenidate (RITALIN) 20 MG tablet   methylphenidate (RITALIN) 20 MG tablet   Obesity (BMI 35.0-39.9 without comorbidity)   Relevant Medications   methylphenidate (RITALIN) 20 MG tablet   methylphenidate (RITALIN) 20 MG tablet   methylphenidate (RITALIN) 20 MG tablet   Bipolar 2 disorder, major depressive episode (HCC)    Patient is followed by Dr. Laurence Spates currently maintained on Lamictal and Abilify      Anxiety disorder     Currently followed by psychiatry does have hydroxyzine and BuSpar.  Continue following with psychiatry as recommended and take medications as prescribed      Shellfish allergy    States she can have lobster and crab does not track.  Has had anaphylaxis in the past that required EpiPen administration.  Patient  requesting a refill today as her EpiPen is expired refill provided did instruct patient if she has used EpiPen she needs to be seen in the nearest emergency department      Relevant Medications   EPINEPHrine (EPIPEN 2-PAK) 0.3 mg/0.3 mL IJ SOAJ injection   Other Visit Diagnoses     Encounter to establish care    -  Primary   Medication refill           Return in about 3 months (around 05/17/2022) for ADD reheck .    Audria Nine, NP

## 2022-02-15 NOTE — Assessment & Plan Note (Signed)
Patient has been evaluated by oncology and has completed Eliquis 5 mg twice daily for 6 months.

## 2022-02-15 NOTE — Assessment & Plan Note (Signed)
Patient states she will use an albuterol inhaler as needed.  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

## 2022-02-15 NOTE — Assessment & Plan Note (Signed)
Currently followed by psychiatry does have hydroxyzine and BuSpar.  Continue following with psychiatry as recommended and take medications as prescribed

## 2022-02-19 ENCOUNTER — Ambulatory Visit: Admit: 2022-02-19 | Discharge: 2022-02-20 | Payer: PRIVATE HEALTH INSURANCE

## 2022-02-19 DIAGNOSIS — L732 Hidradenitis suppurativa: Secondary | ICD-10-CM | POA: Diagnosis not present

## 2022-02-19 MED ADMIN — inFLIXimab-axxq (AVSOLA) 10 mg/kg = 900 mg in sodium chloride (NS) 250 mL IVPB: 10 mg/kg | INTRAVENOUS | @ 17:00:00 | Stop: 2022-02-19

## 2022-02-19 MED ADMIN — cetirizine (ZYRTEC) tablet 10 mg: 10 mg | ORAL | @ 16:00:00 | Stop: 2022-02-19

## 2022-02-19 MED ADMIN — sodium chloride (NS) 0.9 % infusion: 20 mL/h | INTRAVENOUS | @ 17:00:00 | Stop: 2022-02-19

## 2022-02-19 MED ADMIN — acetaminophen (TYLENOL) tablet 650 mg: 650 mg | ORAL | @ 16:00:00 | Stop: 2022-02-19

## 2022-02-19 MED ADMIN — diphenhydrAMINE (BENADRYL) capsule/tablet 25 mg: 25 mg | ORAL | @ 16:00:00 | Stop: 2022-02-19

## 2022-02-19 NOTE — Unmapped (Signed)
Patient presents for standard Avsola (infliximab-axxq) infusion.  In no acute distress. Vitals stable.  Reports no new medical issues or S/S of infection.  PIV placed.  See MAR for premeds.      1141 Avsola (infliximab-axxq) 900 mg infusing as follows:      69ml/hr x 15 min  37ml/hr x 15 min  27ml/hr x 15 min  61ml/hr x 15 min  171ml/hr x 30 min  274ml/hr for the remainder of the infusion.      1245 Remicade (infliximab) infusion complete.  PIV flushed with NS.  Vitals stable.  Patient without any s/s of adverse reaction.

## 2022-02-20 NOTE — Unmapped (Signed)
[facility] Refill Coordination Note    Specialty Medication(s) to be Shipped:   Inflammatory Disorders: Cosentyx    Other medication(s) to be shipped: No additional medications requested for fill at this time     Melissa Pearson, DOB: 04/28/1990  Phone: [phone] (home) [phone] (work)      All above HIPAA information was verified with patient.     Was a Nurse, learning disability used for this call? No    Completed refill call assessment today to schedule patient's medication shipment from the [facility] [phone]).  All relevant notes have been reviewed.     Specialty medication(s) and dose(s) confirmed: Regimen is correct and unchanged.   Changes to medications: [name] reports no changes at this time.  Changes to insurance: No  New side effects reported not previously addressed with a pharmacist or physician: None reported  Questions for the pharmacist: No    Confirmed patient received a Conservation officer, historic buildings and a Surveyor, mining with first shipment. The patient will receive a drug information handout for each medication shipped and additional FDA Medication Guides as required.       DISEASE/MEDICATION-SPECIFIC INFORMATION        For patients on injectable medications: Patient currently has 0 doses left.  Next injection is scheduled for 12/26.    SPECIALTY MEDICATION ADHERENCE     Medication Adherence    Patient reported X missed doses in the last month: 0  Specialty Medication: COSENTYX PEN (2 PENS) 150 mg/mL  Patient is on additional specialty medications: No  Any gaps in refill history greater than 2 weeks in the last 3 months: no  Demonstrates understanding of importance of adherence: yes  Informant: patient  Reliability of informant: reliable              Confirmed plan for next specialty medication refill: delivery by pharmacy  Refills needed for supportive medications: not needed              Were doses missed due to medication being on hold? No    COSENTYX PEN (2 PENS) 150 mg/mL: 0 on hand    REFERRAL TO PHARMACIST     Referral to the pharmacist: Not needed      [facility]     Shipping address confirmed in Epic.     Delivery Scheduled: Yes, Expected medication delivery date: 12/21.     Medication will be delivered via UPS to the prescription address in Epic WAM.    [name]   [facility] Specialty Technician

## 2022-02-21 ENCOUNTER — Telehealth (INDEPENDENT_AMBULATORY_CARE_PROVIDER_SITE_OTHER): Payer: BC Managed Care – PPO | Admitting: Psychiatry

## 2022-02-21 ENCOUNTER — Encounter: Payer: Self-pay | Admitting: Psychiatry

## 2022-02-21 DIAGNOSIS — F418 Other specified anxiety disorders: Secondary | ICD-10-CM | POA: Diagnosis not present

## 2022-02-21 DIAGNOSIS — F901 Attention-deficit hyperactivity disorder, predominantly hyperactive type: Secondary | ICD-10-CM | POA: Diagnosis not present

## 2022-02-21 DIAGNOSIS — G4701 Insomnia due to medical condition: Secondary | ICD-10-CM

## 2022-02-21 DIAGNOSIS — F3178 Bipolar disorder, in full remission, most recent episode mixed: Secondary | ICD-10-CM

## 2022-02-21 MED FILL — COSENTYX PEN 300 MG/2 PENS (150 MG/ML) SUBCUTANEOUS: SUBCUTANEOUS | 28 days supply | Qty: 2 | Fill #3

## 2022-02-21 NOTE — Progress Notes (Signed)
Virtual Visit via Video Note  I connected with Courtney [name] on 02/21/22 at 11:30 AM EST by a video enabled telemedicine application and verified that I am speaking with the correct person using two identifiers.  Location Provider Location : [facility] Patient Location : Home  Participants: Patient , Provider   I discussed the limitations of evaluation and management by telemedicine and the availability of in person appointments. The patient expressed understanding and agreed to proceed.   I discussed the assessment and treatment plan with the patient. The patient was provided an opportunity to ask questions and all were answered. The patient agreed with the plan and demonstrated an understanding of the instructions.   The patient was advised to call back or seek an in-person evaluation if the symptoms worsen or if the condition fails to improve as anticipated.   BH MD OP Progress Note  02/21/2022 12:49 PM Courtney [name]  MRN:  [id number]  Chief Complaint:  Chief Complaint  Patient presents with   Follow-up   Depression   Anxiety   HPI: Courtney [name] is a 31 year old female, single, lives in [place], currently employed, has a history of bipolar disorder type II, ADHD, migraine headaches, hidradenitis was evaluated by telemedicine today.  Patient today reports overall she has been doing fairly well with regards to her mood.  Current medications are beneficial.  She has been compliant.  Denies side effects.  She however reports she is currently sick with upper respiratory tract infection symptoms, has a cough and feels tired.  Planning to contact her primary care provider today.  Patient reports otherwise she has been doing well.  Reports overall sleep has been better.  Denies any suicidality, homicidality or perceptual disturbances.  Has been unable to find a therapist however is currently trying to find a therapist with the [facility].  Patient reports work is going well.  Reports  her mother is supportive.  Patient denies any other concerns today.  Visit Diagnosis:    ICD-10-CM   1. Bipolar disorder, in full remission, most recent episode mixed (HCC)  F31.78    Type II    2. Insomnia due to medical condition  G47.01    Mood    3. Other specified anxiety disorders  F41.8    Generalized anxiety not occurring more days than not    4. Attention deficit hyperactivity disorder (ADHD), predominantly hyperactive type  F90.1       Past Psychiatric History: Reviewed past psychiatric history from progress note on 08/04/2019.  Past trials of Ritalin, Daytrana patch, Adderall, risperidone, Concerta.  Past Medical History:  Past Medical History:  Diagnosis Date   ADHD (attention deficit hyperactivity disorder)    Bipolar disorder (HCC)    Chronic tonsillitis 08/2013   current strep, will finish antibiotic 09/04/2013; snores during sleep, mother denies apnea   Family history of adverse reaction to anesthesia    " My cousin had problems waking up. "   Heart murmur    "when I was born; outgrew it; I was a preemie"   Hidradenitis    Hidradenitis 2017   Migraine 12/19/2016   "I've just had this one for 3 days" (12/20/2016)   Septic shock (HCC) 07/02/2021    Past Surgical History:  Procedure Laterality Date   AXILLARY HIDRADENITIS EXCISION     AXILLARY LYMPH NODE BIOPSY Left 01/06/2019   CYSTOSCOPY W/ URETERAL STENT PLACEMENT Right 05/28/2017   Procedure: CYSTOSCOPY WITH RETROGRADE PYELOGRAM/ RIGHT URETERAL STENT PLACEMENT;  Surgeon: Marlou Porch,  Earle Gell, MD;  Location: [facility];  Service: Urology;  Laterality: Right;   HYDRADENITIS EXCISION Bilateral 04/03/2017   Procedure: EXCISION AND DRAINAGE BILATERAL  HIDRADENITIS AXILLA;  Surgeon: Griselda Miner, MD;  Location: [facility] OR;  Service: General;  Laterality: Bilateral;   INCISION AND DRAINAGE ABSCESS Right 07/20/2016   Procedure: INCISION AND DRAINAGE ABSCESS right axilla;  Surgeon: Abigail Miyamoto, MD;  Location: [facility] OR;   Service: General;  Laterality: Right;   INTRAUTERINE DEVICE (IUD) INSERTION  11/2016   "had the one in my left arm removed"   IRRIGATION AND DEBRIDEMENT ABSCESS Right 12/21/2016   Procedure: IRRIGATION AND DEBRIDEMENT RIGHT AXILLARY HIDRADENITIS;  Surgeon: Andria Meuse, MD;  Location: [facility] OR;  Service: General;  Laterality: Right;   IRRIGATION AND DEBRIDEMENT ABSCESS Left 01/08/2017   Procedure: IRRIGATION AND DEBRIDEMENT AXILLARY ABSCESS;  Surgeon: Griselda Miner, MD;  Location: [facility] OR;  Service: General;  Laterality: Left;   NASAL SEPTOPLASTY W/ TURBINOPLASTY Bilateral 02/25/2019   Procedure: NASAL SEPTOPLASTY WITH TURBINATE REDUCTION;  Surgeon: Serena Colonel, MD;  Location: [facility];  Service: ENT;  Laterality: Bilateral;   TONSILLECTOMY Bilateral 09/14/2013   Procedure: BILATERAL TONSILLECTOMY;  Surgeon: Serena Colonel, MD;  Location: [facility];  Service: ENT;  Laterality: Bilateral;   TYMPANOPLASTY Bilateral    "rebuilt eardrums"   WISDOM TOOTH EXTRACTION      Family Psychiatric History: Reviewed family psych History from progress note on 08/04/2019.  Family History:  Family History  Problem Relation Age of Onset   Hypertension Mother    Hyperlipidemia Mother    Diabetes Mother    Hyperlipidemia Father    Heart attack Father 62   Alcohol abuse Paternal Uncle    Parkinson's disease Maternal Grandmother    Dementia Maternal Grandmother    Diabetes Maternal Grandmother    Bell's palsy Maternal Grandmother     Social History: Reviewed social history from progress note on 08/04/2019. Social History   Socioeconomic History   Marital status: Single    Spouse name: Not on file   Number of children: Not on file   Years of education: EMS/Fire training   Highest education level: Not on file  Occupational History   Not on file  Tobacco Use   Smoking status: Never   Smokeless tobacco: Never  Vaping Use   Vaping Use: Former   Quit date: 07/03/2021    Substances: CBD  Substance and Sexual Activity   Alcohol use: Not Currently    Comment: once a month   Drug use: Not Currently    Types: Marijuana   Sexual activity: Yes    Birth control/protection: Pill, Condom  Other Topics Concern   Not on file  Social History Narrative   02/11/19   From: the area   Living: with Mom currently, working to find her own place   Work: EMS/Fire      Family: good relationship with Mom and Grandma      Enjoys: spend time with friends/family, hunting, bomb fire      Exercise: every shift does training - 2-3 times a week   Diet: well rounded, meat heavy      Safety   Seat belts: Yes    Guns: Yes  and secure   Safe in relationships: Yes    Helmet: no    Social Determinants of Health   Financial Resource Strain: Low Risk  (02/11/2019)   Overall Financial Resource Strain (CARDIA)    Difficulty of  Paying Living Expenses: Not hard at all  Food Insecurity: Not on file  Transportation Needs: Not on file  Physical Activity: Not on file  Stress: Not on file  Social Connections: Not on file    Allergies:  Allergies  Allergen Reactions   Other Hives    ABD pad causes rash Pt can only tolerate cloth tape Pt can only tolerate cloth tape   Shellfish Allergy Hives   Shellfish-Derived Products Shortness Of Breath   Tapentadol Hives and Other (See Comments)    Pt can only tolerate cloth tape   Tape Hives and Other (See Comments)    Pt can only tolerate cloth tape   Ginger Hives   Morphine And Related     " I become very angry"   Vancomycin Other (See Comments)    Burns her vein really bad and always causes them to blow.    Metabolic Disorder Labs: Lab Results  Component Value Date   HGBA1C 5.6 05/18/2021   No results found for: "PROLACTIN" Lab Results  Component Value Date   CHOL 195 07/11/2020   TRIG 224.0 (H) 07/11/2020   HDL 41.80 07/11/2020   CHOLHDL 5 07/11/2020   VLDL 44.8 (H) 07/11/2020   LDLCALC 119 (H) 05/18/2019   LDLCALC  116 (H) 12/03/2016   Lab Results  Component Value Date   TSH 1.58 12/04/2021   TSH 1.00 07/11/2021    Therapeutic Level Labs: No results found for: "LITHIUM" No results found for: "VALPROATE" No results found for: "CBMZ"  Current Medications: Current Outpatient Medications  Medication Sig Dispense Refill   ARIPiprazole (ABILIFY) 2 MG tablet TAKE 1 TABLET BY MOUTH EVERY DAY 90 tablet 1   azelastine (ASTELIN) 0.1 % nasal spray Place 1 spray into both nostrils 2 (two) times daily. Use in each nostril as directed 30 mL 0   bifidobacterium infantis (ALIGN) capsule Take 1 capsule by mouth daily.     busPIRone (BUSPAR) 10 MG tablet TAKE 1 TABLET BY MOUTH TWICE A DAY 180 tablet 0   clindamycin (CLEOCIN) 300 MG capsule Take 300 mg by mouth 2 (two) times daily.     COSENTYX SENSOREADY, 300 MG, 150 MG/ML SOAJ Inject 2 Syringes into the skin every 28 (twenty-eight) days.     diphenhydrAMINE (BENADRYL) 25 mg capsule 25 mg every 6 (six) hours as needed.     EPINEPHrine (EPIPEN 2-PAK) 0.3 mg/0.3 mL IJ SOAJ injection Inject 0.3 mg into the muscle as needed for anaphylaxis. 1 each 0   glucose blood (CONTOUR TEST) test strip And lancets #100 100 each 12   HYDROcodone-acetaminophen (NORCO/VICODIN) 5-325 MG tablet Take 1 tablet by mouth every 6 (six) hours as needed.     hydrOXYzine (ATARAX) 25 MG tablet TAKE 1 TABLET BY MOUTH EVERY 8 HOURS AS NEEDED FOR ITCHING. 270 tablet 1   inFLIXimab (REMICADE IV) Inject 1 Dose into the vein every 30 (thirty) days.     Iron-Vitamin C 100-250 MG TABS Take 1 tablet by mouth daily.     lamoTRIgine (LAMICTAL) 100 MG tablet Take 0.5 tablets (50 mg total) by mouth 2 (two) times daily. 90 tablet 1   levocetirizine (XYZAL) 5 MG tablet Take 1 tablet (5 mg total) by mouth every evening. 30 tablet 0   methylphenidate (RITALIN) 20 MG tablet Take 1 tablet (20 mg total) by mouth daily before breakfast. 30 tablet 0   methylphenidate (RITALIN) 20 MG tablet Take 0.5 tablets (10 mg  total) by mouth daily as needed. 30 tablet  0   methylphenidate (RITALIN) 20 MG tablet Take 1 tablet (20 mg total) by mouth daily before breakfast. 30 tablet 0   Microlet Lancets MISC See admin instructions.     montelukast (SINGULAIR) 10 MG tablet TAKE 1 TABLET BY MOUTH EVERYDAY AT BEDTIME 90 tablet 0   norethindrone (MICRONOR) 0.35 MG tablet Take 1 tablet by mouth daily.     ofloxacin (FLOXIN) 0.3 % OTIC solution PLACE 5 DROPS IN EAR(S) 2 TIMES DAILY FOR 3 DAYS.     ondansetron (ZOFRAN-ODT) 4 MG disintegrating tablet Take 1 tablet (4 mg total) by mouth every 8 (eight) hours as needed for nausea or vomiting. 20 tablet 0   traZODone (DESYREL) 50 MG tablet TAKE 1 TABLET BY MOUTH EVERYDAY AT BEDTIME 90 tablet 0   methylphenidate (RITALIN) 20 MG tablet Take 1 tablet (20 mg total) by mouth daily before breakfast for 5 days. 5 tablet 0   traMADol (ULTRAM) 50 MG tablet Take 1 tablet (50 mg total) by mouth every six (6) hours as needed for pain. (Patient not taking: Reported on 02/21/2022) 15 tablet 0   No current facility-administered medications for this visit.     Musculoskeletal: Strength & Muscle Tone:  UTA Gait & Station:  Seated Patient leans: N/A  Psychiatric Specialty Exam: Review of Systems  Constitutional:  Positive for fatigue.  HENT:  Positive for congestion and rhinorrhea.   Respiratory:  Positive for cough.   Psychiatric/Behavioral:  The patient is nervous/anxious.   All other systems reviewed and are negative.   There were no vitals taken for this visit.There is no height or weight on file to calculate BMI.  General Appearance: Casual  Eye Contact:  Fair  Speech:  Normal Rate  Volume:  Normal  Mood:  Anxious  Affect:  Congruent  Thought Process:  Goal Directed and Descriptions of Associations: Intact  Orientation:  Full (Time, Place, and Person)  Thought Content: Logical   Suicidal Thoughts:  No  Homicidal Thoughts:  No  Memory:  Immediate;   Fair Recent;    Fair Remote;   Fair  Judgement:  Fair  Insight:  Fair  Psychomotor Activity:  Normal  Concentration:  Concentration: Fair and Attention Span: Fair  Recall:  F[employer]Fair  Fund of Knowledge: Fair  Language: Fair  Akathisia:  No  Handed:  Right  AIMS (if indicated): not done  Assets:  Communication Skills Desire for Improvement Social Support  ADL's:  Intact  Cognition: WNL  Sleep:  Fair   Screenings: AIMS    Flowsheet Row Office Visit from [facility]23[facility]20[facility] Office Visit from [facility]/08/2021 in [facility] Office Visit from [facility]/28/2021 [facility]  AIMS Total Score 0 0 0      GAD-7    Flowsheet Row [facility] Office Visit from 11/23/2021 in [facility] Office Visit from 03/27/2021 in [facility] Video Visit from 10[facility]26/2020 in [facility] Office Visit from 10/07/2020 in [facility]  Total GAD-7 Score 0 11 3 3 10       PHQ2-9    Flowsheet Row Video Visit from 02/21/2022 in [facility] Office Visit from [facility]20[facility] Visit from 11/23/2021 in [facility] Office Visit from 08/08/2021 in [facility] Office Visit from 06/28/2021 in [facility]  PHQ-2 Total Score 0 0 3  4 4  PHQ-9 Total Score -- 0 11 10 11       Flowsheet Row Video Visit from 02/21/2022 in [facility] Office Visit from 11/23/2021 in [facility] ED from 10/24/2021 in [facility] [place]  C-SSRS RISK CATEGORY No Risk No Risk No Risk        Assessment and Plan: Courtney [name] is a 31 year old Caucasian female, employed, lives in [place], has a  history of ADHD, migraine headaches, bipolar disorder type II, currently struggling with upper respiratory tract infection symptoms otherwise doing fairly well.  Plan as noted below.  Plan Bipolar disorder type II in remission most recent episode depressed, mixed Lamotrigine 100 mg p.o. daily Abilify 2 mg p.o. daily Trazodone 50 mg p.o. nightly  Insomnia-stable Trazodone 50 mg p.o. nightly   Other specified anxiety disorder-generalized anxiety not occurring more days than not-improving BuSpar 10 mg p.o. twice daily Hydroxyzine 25 mg p.o. 3 times daily as needed Patient was advised to establish care with therapist, she is motivated to do so.  ADHD per history-patient was advised to fax ADHD testing report.  Today however reports she would like to continue her care with her provider who is currently managing it.  Follow-up in clinic in 3 to 4 months or sooner if needed.  Collaboration of Care: Collaboration of Care: Other patient advised to go to the nearest urgent care or contact primary care provider for upper respiratory tract infection symptoms.  Patient/Guardian was advised Release of Information must be obtained prior to any record release in order to collaborate their care with an outside provider. Patient/Guardian was advised if they have not already done so to contact the registration department to sign all necessary forms in order for [facility] to release information regarding their care.   Consent: Patient/Guardian gives verbal consent for treatment and assignment of benefits for services provided during this visit. Patient/Guardian expressed understanding and agreed to proceed.   This note was generated in part or whole with voice recognition software. Voice recognition is usually quite accurate but there are transcription errors that can and very often do occur. I apologize for any typographical errors that were not detected and corrected.      [place], MD 02/21/2022,  12:49 PM

## 2022-02-28 ENCOUNTER — Ambulatory Visit
Admission: RE | Admit: 2022-02-28 | Discharge: 2022-02-28 | Disposition: A | Discharge status: Home / Self Care | Payer: BC Managed Care – PPO | Source: Ambulatory Visit

## 2022-02-28 VITALS — BP 91/69 | HR 125 | Temp 98.2°F | Resp 17

## 2022-02-28 DIAGNOSIS — R6889 Other general symptoms and signs: Secondary | ICD-10-CM

## 2022-02-28 MED ORDER — BENZONATATE 100 MG PO CAPS: ORAL_CAPSULE | ORAL | 0 refills | Status: DC

## 2022-02-28 MED ORDER — HYDROCOD POLI-CHLORPHE POLI ER 10-8 MG/5ML PO SUER: 5.0000 mL | Freq: Two times a day (BID) | ORAL | 0 refills | Status: AC | PRN

## 2022-02-28 MED ORDER — OSELTAMIVIR PHOSPHATE 75 MG PO CAPS: 75.0000 mg | ORAL_CAPSULE | Freq: Two times a day (BID) | ORAL | 0 refills | Status: DC

## 2022-02-28 NOTE — Discharge Instructions (Addendum)
Follow up here or with your primary care provider if your symptoms are worsening or not improving with treatment.     

## 2022-02-28 NOTE — ED Provider Notes (Signed)
[facility]    CSN: [id number] Arrival date & time: 02/28/22  1514      History   Chief Complaint Chief Complaint  Patient presents with   Cough    Runny nose, congestion, low grade fever, sore throat, Migraine, flu-like symptoms, entire body aches - Entered by patient   Generalized Body Aches   Chills    HPI Courtney [name] is a 31 y.o. female.    Cough   Presents to urgent care with complaint of cough, body aches, chills x 2 days.  She states the cough is the worst symptom and over-the-counter cough medicines have not been helping.  She has been using her mother's hydrocodone based cough syrup which has been effective in controlling the cough and helping her sleep.  Past Medical History:  Diagnosis Date   ADHD (attention deficit hyperactivity disorder)    Bipolar disorder (HCC)    Chronic tonsillitis 08/2013   current strep, will finish antibiotic 09/04/2013; snores during sleep, mother denies apnea   Family history of adverse reaction to anesthesia    " My cousin had problems waking up. "   Heart murmur    "when I was born; outgrew it; I was a preemie"   Hidradenitis    Hidradenitis 2017   Migraine 12/19/2016   "I've just had this one for 3 days" (12/20/2016)   Septic shock (HCC) 07/02/2021    Patient Active Problem List   Diagnosis Date Noted   Chronic rhinitis 02/15/2022   Shellfish allergy 02/15/2022   Pulmonary embolism (HCC) 07/19/2021   Hypotension 07/11/2021   Trichomonas vaginitis 07/05/2021   Acute metabolic encephalopathy 07/05/2021   Bilateral pulmonary embolism (HCC) 07/05/2021   Anxiety disorder 06/28/2021   Prediabetes 05/25/2021   Allergy to adhesive tape 05/25/2021   Hidradenitis suppurativa 05/23/2021   Insomnia 03/27/2021   Anemia 12/21/2020   Hypoglycemia 11/17/2020   Bipolar disorder, in full remission, most recent episode mixed (HCC) 10/07/2020   Bipolar disorder, in full remission, most recent episode depressed (HCC)  03/03/2020   Bipolar 2 disorder, major depressive episode (HCC) 08/04/2019   At risk for long QT syndrome 08/04/2019   Obesity (BMI 35.0-39.9 without comorbidity) 03/19/2019   Reactive airway disease 02/11/2019   Conductive hearing loss 03/13/2018   Axillary hidradenitis suppurativa 04/03/2017   Vitamin D deficiency 10/18/2014   Attention deficit hyperactivity disorder (ADHD), predominantly hyperactive type 02/11/2012    Past Surgical History:  Procedure Laterality Date   AXILLARY HIDRADENITIS EXCISION     AXILLARY LYMPH NODE BIOPSY Left 01/06/2019   CYSTOSCOPY W/ URETERAL STENT PLACEMENT Right 05/28/2017   Procedure: CYSTOSCOPY WITH RETROGRADE PYELOGRAM/ RIGHT URETERAL STENT PLACEMENT;  Surgeon: Crist FatHerrick, Benjamin W, MD;  Location: [facility];  Service: Urology;  Laterality: Right;   HYDRADENITIS EXCISION Bilateral 04/03/2017   Procedure: EXCISION AND DRAINAGE BILATERAL  HIDRADENITIS AXILLA;  Surgeon: Griselda Mineroth, Paul III, MD;  [facility];  Service: General;  Laterality: Bilateral;   INCISION AND DRAINAGE ABSCESS Right 07/20/2016   Procedure: INCISION AND DRAINAGE ABSCESS right axilla;  Surgeon: Abigail MiyamotoBlackman, Douglas, MD;  [facility];  Service: General;  Laterality: Right;   INTRAUTERINE DEVICE (IUD) INSERTION  11/2016   "had the one in my left arm removed"   IRRIGATION AND DEBRIDEMENT ABSCESS Right 12/21/2016   Procedure: IRRIGATION AND DEBRIDEMENT RIGHT AXILLARY HIDRADENITIS;  Surgeon: Andria MeuseWhite, Christopher M, MD;  [facility];  Service: General;  Laterality: Right;   IRRIGATION AND DEBRIDEMENT ABSCESS Left 01/08/2017   Procedure:  IRRIGATION AND DEBRIDEMENT AXILLARY ABSCESS;  Surgeon: Griselda Miner, MD;  Location: [facility] OR;  Service: General;  Laterality: Left;   NASAL SEPTOPLASTY W/ TURBINOPLASTY Bilateral 02/25/2019   Procedure: NASAL SEPTOPLASTY WITH TURBINATE REDUCTION;  Surgeon: Serena Colonel, MD;  Location: [facility];  Service: ENT;  Laterality: Bilateral;   TONSILLECTOMY  Bilateral 09/14/2013   Procedure: BILATERAL TONSILLECTOMY;  Surgeon: Serena Colonel, MD;  Location: [facility];  Service: ENT;  Laterality: Bilateral;   TYMPANOPLASTY Bilateral    "rebuilt eardrums"   WISDOM TOOTH EXTRACTION      OB History   No obstetric history on file.      Home Medications    Prior to Admission medications   Medication Sig Start Date End Date Taking? Authorizing Provider  ARIPiprazole (ABILIFY) 2 MG tablet TAKE 1 TABLET BY MOUTH EVERY DAY 02/12/22   Jomarie Longs, MD  azelastine (ASTELIN) 0.1 % nasal spray Place 1 spray into both nostrils 2 (two) times daily. Use in each nostril as directed 02/15/22   Eden Emms, NP  bifidobacterium infantis (ALIGN) capsule Take 1 capsule by mouth daily.    [provider]  busPIRone (BUSPAR) 10 MG tablet TAKE 1 TABLET BY MOUTH TWICE A DAY 02/06/22   Jomarie Longs, MD  ciprofloxacin-dexamethasone (CIPRODEX) OTIC suspension PLACE 4 DROPS INTO BOTH EARS 2 TIMES DAILY FOR 10 DAYS.    [provider]  clindamycin (CLEOCIN) 300 MG capsule Take 300 mg by mouth 2 (two) times daily. 11/09/21   [provider]  COSENTYX SENSOREADY, 300 MG, 150 MG/ML SOAJ Inject 2 Syringes into the skin every 28 (twenty-eight) days. 10/30/21   [provider]  diphenhydrAMINE (BENADRYL) 25 mg capsule 25 mg every 6 (six) hours as needed.    [provider]  EPINEPHrine (EPIPEN 2-PAK) 0.3 mg/0.3 mL IJ SOAJ injection Inject 0.3 mg into the muscle as needed for anaphylaxis. 02/15/22   Eden Emms, NP  glucose blood (CONTOUR TEST) test strip And lancets #100 05/18/21   Romero Belling, MD  HYDROcodone-acetaminophen (NORCO/VICODIN) 5-325 MG tablet Take 1 tablet by mouth every 6 (six) hours as needed.    [provider]  hydrOXYzine (ATARAX) 25 MG tablet TAKE 1 TABLET BY MOUTH EVERY 8 HOURS AS NEEDED FOR ITCHING. 01/16/22   Worthy Rancher B, FNP  inFLIXimab (REMICADE IV) Inject 1 Dose into the vein  every 30 (thirty) days.    [provider]  Iron-Vitamin C 100-250 MG TABS Take 1 tablet by mouth daily.    [provider]  lamoTRIgine (LAMICTAL) 100 MG tablet Take 0.5 tablets (50 mg total) by mouth 2 (two) times daily. 11/24/21   Jomarie Longs, MD  levocetirizine (XYZAL) 5 MG tablet Take 1 tablet (5 mg total) by mouth every evening. 02/15/22   Eden Emms, NP  methylphenidate (RITALIN) 20 MG tablet Take 1 tablet (20 mg total) by mouth daily before breakfast. 01/11/22   Eden Emms, NP  methylphenidate (RITALIN) 20 MG tablet Take 0.5 tablets (10 mg total) by mouth daily as needed. 02/15/22   Eden Emms, NP  methylphenidate (RITALIN) 20 MG tablet Take 1 tablet (20 mg total) by mouth daily before breakfast. 02/15/22 03/17/22  Eden Emms, NP  methylphenidate (RITALIN) 20 MG tablet Take 1 tablet (20 mg total) by mouth daily before breakfast for 5 days. 02/15/22 02/20/22  Eden Emms, NP  Microlet Lancets MISC See admin instructions. 05/18/21   [provider]  montelukast (  SINGULAIR) 10 MG tablet TAKE 1 TABLET BY MOUTH EVERYDAY AT BEDTIME 02/09/22   Worthy Rancher B, FNP  norethindrone (MICRONOR) 0.35 MG tablet Take 1 tablet by mouth daily. 07/19/21   [provider]  ofloxacin (FLOXIN) 0.3 % OTIC solution PLACE 5 DROPS IN EAR(S) 2 TIMES DAILY FOR 3 DAYS.    [provider]  ondansetron (ZOFRAN-ODT) 4 MG disintegrating tablet Take 1 tablet (4 mg total) by mouth every 8 (eight) hours as needed for nausea or vomiting. 10/24/21   Particia Nearing, PA-C  traMADol (ULTRAM) 50 MG tablet Take 1 tablet (50 mg total) by mouth every six (6) hours as needed for pain. Patient not taking: Reported on 02/21/2022 08/08/21     traZODone (DESYREL) 50 MG tablet TAKE 1 TABLET BY MOUTH EVERYDAY AT BEDTIME 11/10/21   Gweneth Dimitri, MD    Family History Family History  Problem Relation Age of Onset   Hypertension Mother    Hyperlipidemia Mother    Diabetes  Mother    Hyperlipidemia Father    Heart attack Father 51   Alcohol abuse Paternal Uncle    Parkinson's disease Maternal Grandmother    Dementia Maternal Grandmother    Diabetes Maternal Grandmother    Bell's palsy Maternal Grandmother     Social History Social History   Tobacco Use   Smoking status: Never   Smokeless tobacco: Never  Vaping Use   Vaping Use: Former   Quit date: 07/03/2021   Substances: CBD  Substance Use Topics   Alcohol use: Not Currently    Comment: once a month   Drug use: Not Currently    Types: Marijuana     Allergies   Other, Shellfish allergy, Shellfish-derived products, Tapentadol, Tape, Ginger, Morphine and related, and Vancomycin   Review of Systems Review of Systems  Respiratory:  Positive for cough.      Physical Exam Triage Vital Signs ED Triage Vitals [02/28/22 1539]  Enc Vitals Group     BP 91/69     Pulse Rate (!) 125     Resp 17     Temp 98.2 F (36.8 C)     Temp src      SpO2 97 %     Weight      Height      Head Circumference      Peak Flow      Pain Score 0     Pain Loc      Pain Edu?      Excl. in GC?    No data found.  Updated Vital Signs BP 91/69   Pulse (!) 125   Temp 98.2 F (36.8 C)   Resp 17   LMP  (LMP Unknown)   SpO2 97%   Visual Acuity Right Eye Distance:   Left Eye Distance:   Bilateral Distance:    Right Eye Near:   Left Eye Near:    Bilateral Near:     Physical Exam Vitals reviewed.  Constitutional:      Appearance: She is ill-appearing.  Cardiovascular:     Rate and Rhythm: Normal rate and regular rhythm.     Pulses: Normal pulses.     Heart sounds: Normal heart sounds.  Pulmonary:     Effort: Pulmonary effort is normal.     Breath sounds: Normal breath sounds.  Skin:    General: Skin is warm and dry.  Neurological:     General: No focal deficit present.     Mental  Status: She is alert and oriented to person, place, and time.  Psychiatric:        Mood and Affect: Mood  normal.        Behavior: Behavior normal.      UC Treatments / Results  Labs (all labs ordered are listed, but only abnormal results are displayed) Labs Reviewed - No data to display  EKG   Radiology No results found.  Procedures Procedures (including critical care time)  Medications Ordered in UC Medications - No data to display  Initial Impression / Assessment and Plan / UC Course  I have reviewed the triage vital signs and the nursing notes.  Pertinent labs & imaging results that were available during my care of the patient were reviewed by me and considered in my medical decision making (see chart for details).   Patient is afebrile here without recent antipyretics. Satting well on room air. Overall is very ill appearing, well hydrated, without respiratory distress. Pulmonary exam is unremarkable.  Lungs CTAB without wheezing, rhonchi, rales.  Symptoms are consistent with viral process including influenza.  Will treat presumptively for influenza with Tamiflu in the context of limited lab resources.  Will also prescribe benzonatate for daytime use and cough control as well as Tussionex for nighttime use.  Final Clinical Impressions(s) / UC Diagnoses   Final diagnoses:  None   Discharge Instructions   None    ED Prescriptions   None    PDMP not reviewed this encounter.   Charma Igo, [place] 02/28/22 1547

## 2022-02-28 NOTE — ED Triage Notes (Signed)
Pt. Presents to UC w/ c/o a cough, body aches and chills for the past 2 days.  

## 2022-03-01 ENCOUNTER — Other Ambulatory Visit: Payer: BC Managed Care – PPO

## 2022-03-01 ENCOUNTER — Inpatient Hospital Stay: Admission: RE | Admit: 2022-03-01 | Payer: BC Managed Care – PPO | Source: Ambulatory Visit

## 2022-03-06 NOTE — Unmapped (Signed)
Pt instructed on use of [facility] night before surgery. Instructed  NPO after 10pm night before surgery and meds per instructions and driver needed .Pt also informed that we will call the day before surgery to give arrival time for day of surgery/procedure. Pt/family allowed to ask questions.

## 2022-03-08 NOTE — Unmapped (Signed)
This encounter was created in error - please disregard.

## 2022-03-09 ENCOUNTER — Other Ambulatory Visit: Payer: Self-pay | Admitting: Nurse Practitioner

## 2022-03-09 ENCOUNTER — Encounter
Admit: 2022-03-09 | Discharge: 2022-03-09 | Payer: PRIVATE HEALTH INSURANCE | Attending: Registered Nurse | Primary: Registered Nurse

## 2022-03-09 ENCOUNTER — Ambulatory Visit: Admit: 2022-03-09 | Discharge: 2022-03-09 | Payer: PRIVATE HEALTH INSURANCE

## 2022-03-09 DIAGNOSIS — Z7901 Long term (current) use of anticoagulants: Secondary | ICD-10-CM | POA: Diagnosis not present

## 2022-03-09 DIAGNOSIS — J310 Chronic rhinitis: Secondary | ICD-10-CM

## 2022-03-09 DIAGNOSIS — L732 Hidradenitis suppurativa: Secondary | ICD-10-CM | POA: Diagnosis not present

## 2022-03-09 DIAGNOSIS — L0231 Cutaneous abscess of buttock: Secondary | ICD-10-CM | POA: Diagnosis not present

## 2022-03-09 DIAGNOSIS — F319 Bipolar disorder, unspecified: Secondary | ICD-10-CM | POA: Diagnosis not present

## 2022-03-09 DIAGNOSIS — Z888 Allergy status to other drugs, medicaments and biological substances status: Secondary | ICD-10-CM | POA: Diagnosis not present

## 2022-03-09 DIAGNOSIS — Z87891 Personal history of nicotine dependence: Secondary | ICD-10-CM | POA: Diagnosis not present

## 2022-03-09 DIAGNOSIS — I96 Gangrene, not elsewhere classified: Secondary | ICD-10-CM | POA: Diagnosis not present

## 2022-03-09 DIAGNOSIS — Z8673 Personal history of transient ischemic attack (TIA), and cerebral infarction without residual deficits: Secondary | ICD-10-CM | POA: Diagnosis not present

## 2022-03-09 DIAGNOSIS — Z881 Allergy status to other antibiotic agents status: Secondary | ICD-10-CM | POA: Diagnosis not present

## 2022-03-09 DIAGNOSIS — Z885 Allergy status to narcotic agent status: Secondary | ICD-10-CM | POA: Diagnosis not present

## 2022-03-09 DIAGNOSIS — Z883 Allergy status to other anti-infective agents status: Secondary | ICD-10-CM | POA: Diagnosis not present

## 2022-03-09 LAB — PREGNANCY, URINE: PREGNANCY TEST URINE: NEGATIVE

## 2022-03-09 MED ORDER — GABAPENTIN 300 MG CAPSULE
ORAL_CAPSULE | Freq: Three times a day (TID) | ORAL | 0 refills | 7 days | Status: CP
Start: 2022-03-09 — End: 2022-03-16

## 2022-03-09 MED ORDER — DOCUSATE SODIUM 100 MG CAPSULE
ORAL_CAPSULE | Freq: Two times a day (BID) | ORAL | 0 refills | 14 days | Status: CP
Start: 2022-03-09 — End: 2022-03-23

## 2022-03-09 MED ORDER — ONDANSETRON 4 MG DISINTEGRATING TABLET
ORAL_TABLET | Freq: Three times a day (TID) | ORAL | 0 refills | 5 days | Status: CP | PRN
Start: 2022-03-09 — End: 2022-03-14

## 2022-03-09 MED ADMIN — lactated Ringers infusion: 10 mL/h | INTRAVENOUS | @ 16:00:00 | Stop: 2022-03-09

## 2022-03-09 MED ADMIN — ondansetron (ZOFRAN) injection: INTRAVENOUS | @ 16:00:00 | Stop: 2022-03-09

## 2022-03-09 MED ADMIN — phenylephrine (NEO-SYNEPHRINE) injection: INTRAVENOUS | @ 16:00:00 | Stop: 2022-03-09

## 2022-03-09 MED ADMIN — phenylephrine (NEO-SYNEPHRINE) injection: INTRAVENOUS | @ 17:00:00 | Stop: 2022-03-09

## 2022-03-09 MED ADMIN — diphenhydrAMINE (BENADRYL) injection: INTRAVENOUS | @ 16:00:00 | Stop: 2022-03-09

## 2022-03-09 MED ADMIN — fentaNYL (PF) (SUBLIMAZE) injection: INTRAVENOUS | @ 16:00:00 | Stop: 2022-03-09

## 2022-03-09 MED ADMIN — bupivacaine-EPINEPHrine (PF) (MARCAINE-PF w/EPI) 0.5 %-1:200,000 injection (PF): @ 16:00:00 | Stop: 2022-03-09

## 2022-03-09 MED ADMIN — Propofol (DIPRIVAN) injection: INTRAVENOUS | @ 16:00:00 | Stop: 2022-03-09

## 2022-03-09 MED ADMIN — midazolam (VERSED) injection: INTRAVENOUS | @ 16:00:00 | Stop: 2022-03-09

## 2022-03-09 MED ADMIN — dexmedeTOMIDine 400 mcg in sodium chloride 0.9 % 100 mL (4 mcg/mL) infusion: INTRAVENOUS | @ 16:00:00 | Stop: 2022-03-09

## 2022-03-09 MED ADMIN — lidocaine (XYLOCAINE) 20 mg/mL (2 %) injection: INTRAVENOUS | @ 16:00:00 | Stop: 2022-03-09

## 2022-03-09 MED ADMIN — dexAMETHasone (DECADRON) 4 mg/mL injection: INTRAVENOUS | @ 16:00:00 | Stop: 2022-03-09

## 2022-03-09 MED ADMIN — ondansetron (ZOFRAN) injection 4 mg: 4 mg | INTRAVENOUS | @ 17:00:00 | Stop: 2022-03-09

## 2022-03-09 MED ADMIN — gentamicin (GARAMYCIN) 100 mg in sodium chloride (NS) 0.9 % 50 mL IVPB: 1.5 mg/kg | INTRAVENOUS | @ 16:00:00 | Stop: 2022-03-09

## 2022-03-09 NOTE — Unmapped (Signed)
Surgery Consult Note         Assessment:     Hidradenitis suppurativa, history of bilateral axillary excisions presents for bilateral groin, left greater than right disease, predominantly on the inner thigh with request from the patient in dermatology for excision/unroofing of these areas.          Plan:     We will plan for surgical unroofing and excision of any areas of grouped nodules in the operating room under general anesthesia.  We discussed the perioperative expectations and wound care expectations.  We discussed risks including but not limited to bleeding, infection, the likelihood of at least some type of recurrence, and damage to surrounding structures.  We discussed that we may close these partially but generally will not close the wound substantially and not completely.  She wishes to proceed.    We will schedule this after her 45-month Eliquis treatment for provoked DVT is complete.        History of Present Illness:    Reason for Consult:  Melissa Pearson is seen in consultation for left groin hidradenitis at the request of Harvin Hazel, MD for evaluation of hidradenitis suppurativa.      HPI:   This is a pleasant 32 year old female who has a personal history of hidradenitis suppurativa on medications including including Eliquis for DVTs and infusional therapies as well as topical medication and oral antibiotics for hidradenitis suppurativa.  She is being managed by the dermatology team at [facility] and was referred for procedure under general anesthesia.  The patient has a history of wide excisions of areas in her right and left axilla.  She currently has active disease more present in her left and some in her right groin.  She would like to have these areas resected.    She has a history of sepsis with a PICC line previously and had a DVT secondary to this and is currently on Eliquis completing a 44-month course      Allergies    Adhesive tape-silicones, Ginger, Other, Amoxicillin-pot clavulanate, Opioids - morphine analogues, Iodine, and Vancomycin      Medications      Current Facility-Administered Medications   Medication Dose Route Frequency Provider Last Rate Last Admin    gentamicin (GARAMYCIN) 100 mg in sodium chloride (NS) 0.9 % 50 mL IVPB  1.5 mg/kg (Adjusted) Intravenous Once Harvin Hazel, MD             Past Medical History    Past Medical History:   Diagnosis Date    Acne     ADHD     Allergic     Bipolar 1 disorder (CMS-HCC)     Disorder of skin or subcutaneous tissue 2018    HS    Flu     had tamiflu    Hydradenitis     Keloid     Right ear         Past Surgical History    Past Surgical History:   Procedure Laterality Date    INNER EAR SURGERY Bilateral     reconstruction of ear drum as child    SKIN BIOPSY      TONSILLECTOMY           Family History    Family History   Problem Relation Age of Onset    Allergy (severe) Mother     Diabetes Mother     Stroke Maternal Grandmother         Bell???s  palsy    Melanoma Neg Hx     Basal cell carcinoma Neg Hx     Squamous cell carcinoma Neg Hx          Social History:    Social History     Socioeconomic History    Marital status: Single     Spouse name: None    Number of children: None    Years of education: None    Highest education level: None   Tobacco Use    Smoking status: Former     Types: e-Cigarettes    Smokeless tobacco: Never    Tobacco comments:     Hx of vape use   Vaping Use    Vaping Use: Former   Substance and Sexual Activity    Alcohol use: Yes     Comment: 1 per month or less    Drug use: Never    Sexual activity: Not Currently     Partners: Male     Birth control/protection: Condom, Birth Control Pills   Other Topics Concern    Do you use sunscreen? Yes    Tanning bed use? Yes    Are you easily burned? Yes    Excessive sun exposure? Yes    Blistering sunburns? Yes         Review of Systems    Review of Systems   Constitutional:  Negative for chills and diaphoresis.   Respiratory:  Negative for choking and chest tightness. Cardiovascular:  Negative for palpitations and leg swelling.   Gastrointestinal:  Negative for anal bleeding, blood in stool and rectal pain.   Endocrine: Negative for cold intolerance, heat intolerance and polydipsia.   Genitourinary:  Negative for dysuria and flank pain.   Musculoskeletal:  Negative for back pain and neck pain.   Skin:  Positive for wound. Negative for color change and rash.   Neurological:  Negative for dizziness, light-headedness and headaches.   Hematological:  Negative for adenopathy. Does not bruise/bleed easily.           Vital Signs    LMP 02/08/2022   There is no height or weight on file to calculate BMI.      Physical Exam    GENERAL: This is a well-developed, well-nourished female, not in acute distress, appears comfortable  HEENT: Head is normocephalic and atraumatic. Extraocular muscles are intact. No scleral icterus. Oral mucosa moist and pink without erythema or exudate.  NECK: Supple, no JVD. No cervical lymphadenopathy.  THYROID: No palpable masses  LUNGS: Normal work of breathing with equal chest wall expansion.  BREASTS: Deferred  HEART: Regular rate and rhythm.  Good peripheral perfusion  ABDOMEN: Soft, nontender, nondistended.  No organomegaly.   No hernia appreciated.  RECTAL EXAM: Deferred  EXTREMITIES: No clubbing, cyanosis or edema noted.  Normal gait.  SKIN:  Normal color, tone and turgor.  Hidradenitis suppurativa noted in the bilateral groin, worse on the inner thighs with the left groin greater than the right.  There is tunneling and patchy areas of chronic erythema and skin thickening.  No current abscess.  NEUROLOGIC: No focal neurologic deficits. Cranial nerves II through XII are grossly intact. Sensation is intact throughout.  Steady gait.  PSYCH:  Normal affect, Alert and Oriented x 3.    Test Results    Imaging:     None      Problem List    Patient Active Problem List   Diagnosis    Hidradenitis suppurativa

## 2022-03-09 NOTE — Unmapped (Signed)
Operative Note (CSN: [id number])    Service    Date of Surgery: 03/09/2022    Surgeon(s) and Role:     Harvin Hazel, MD - Primary    Procedure(s):  1.  Excision of right groin hidradenitis, sharp excision including skin and subcutaneous tissue, postoperative wound size 2 x 4 cm  2.  Excision of left groin hidradenitis, sharp excision including skin and subcutaneous tissues, postoperative wound size 4 x 7 cm  3.  Incision and drainage of right proximal lateral thigh/distal buttock abscess with necrotic skin and soft tissue excision/debridement sharply, 3 cm??.    Op Note    Pre-op Diagnosis: Hidradenitis bilateral groin, right thin cellulitis    Post-op Diagnosis: Hidradenitis bilateral groins, right lateral thigh abscess    Indications for Surgery: Hidradenitis    Findings: Same, right thigh abscess    Anesthesia: General    Estimated Blood Loss: 5 mL    Complications: None    Specimens:   ID Type Source Tests Collected by Time Destination   1 : sweat glands from groin/perineum area and right buttock in formalin per MD Tissue Perineum SURGICAL PATHOLOGY Hulan Fray, MD 03/09/2022 1117        Implants:  * No implants in log *    Procedure: The patient is brought to the operating room placed on the operative table in the frog-leg position.  The groin areas were visible, widely prepped and draped.  Antibiotics were confirmed.  A timeout was performed by the circulating nurse and all present were in agreement.    I turned my attention the left groin and debrided the area after field block with Marcaine with epinephrine was performed.  This was performed sharply and debrided down to healthy tissue, unroofing all tunnels and debriding the area of clustered inflammatory nodules.  The debridement included skin and subcutaneous tissues.  The wound size after sharp excisional debridement was 4 x 7 cm.    In an identical fashion in the mirror image location on the contralateral side I performed an excisional debridement of skin and subcutaneous tissues for tunneling nodular hidradenitis excising the glands, measuring 2 x 4 cm.    Next we reposition the patient and prepped the lateral thigh that was marked in the preoperative holding area.  The erythematous area was fluctuant centrally.  This was incised and drained.  Approximately 3 cm?? of subcutaneous tissues and skin were sharply excised, this was necrotic.  Given this debridement this complex deep incision and drainage was performed successfully.  The wound was cauterized for hemostasis as well as the other 2.  This lateral wound was packed and a dry dry gauze dressing was applied.     Iodinated Vaseline gauze was applied to the medial thigh/groin wounds, Telfa and dry dressings were applied.    All counts were reported to me as correct.    Surgeon Notes: I performed the procedure    Karmen Bongo, MD, M.D.  Date: 03/09/2022  Time: 12:00 PM

## 2022-03-20 ENCOUNTER — Ambulatory Visit: Admit: 2022-03-20 | Discharge: 2022-03-21 | Payer: PRIVATE HEALTH INSURANCE

## 2022-03-20 DIAGNOSIS — L732 Hidradenitis suppurativa: Secondary | ICD-10-CM | POA: Diagnosis not present

## 2022-03-20 LAB — CBC W/ AUTO DIFF
BASOPHILS ABSOLUTE COUNT: 0.1 10*9/L (ref 0.0–0.1)
BASOPHILS RELATIVE PERCENT: 0.8 %
EOSINOPHILS ABSOLUTE COUNT: 0.2 10*9/L (ref 0.0–0.5)
EOSINOPHILS RELATIVE PERCENT: 1.8 %
HEMATOCRIT: 32 % — ABNORMAL LOW (ref 34.0–44.0)
HEMOGLOBIN: 10.7 g/dL — ABNORMAL LOW (ref 11.3–14.9)
LYMPHOCYTES ABSOLUTE COUNT: 3.3 10*9/L (ref 1.1–3.6)
LYMPHOCYTES RELATIVE PERCENT: 25.3 %
MEAN CORPUSCULAR HEMOGLOBIN CONC: 33.5 g/dL (ref 32.0–36.0)
MEAN CORPUSCULAR HEMOGLOBIN: 28.9 pg (ref 25.9–32.4)
MEAN CORPUSCULAR VOLUME: 86.2 fL (ref 77.6–95.7)
MEAN PLATELET VOLUME: 7.3 fL (ref 6.8–10.7)
MONOCYTES ABSOLUTE COUNT: 0.9 10*9/L — ABNORMAL HIGH (ref 0.3–0.8)
MONOCYTES RELATIVE PERCENT: 7 %
NEUTROPHILS ABSOLUTE COUNT: 8.6 10*9/L — ABNORMAL HIGH (ref 1.8–7.8)
NEUTROPHILS RELATIVE PERCENT: 65.1 %
NUCLEATED RED BLOOD CELLS: 0 /100{WBCs} (ref <=4)
PLATELET COUNT: 348 10*9/L (ref 150–450)
RED BLOOD CELL COUNT: 3.71 10*12/L — ABNORMAL LOW (ref 3.95–5.13)
RED CELL DISTRIBUTION WIDTH: 14.2 % (ref 12.2–15.2)
WBC ADJUSTED: 13.2 10*9/L — ABNORMAL HIGH (ref 3.6–11.2)

## 2022-03-20 LAB — SEDIMENTATION RATE: ERYTHROCYTE SEDIMENTATION RATE: 130 mm/h — ABNORMAL HIGH (ref 0–20)

## 2022-03-20 LAB — CREATININE
CREATININE: 0.77 mg/dL
EGFR CKD-EPI (2021) FEMALE: >90 mL/min/{1.73_m2} (ref >=60)

## 2022-03-20 LAB — C-REACTIVE PROTEIN: C-REACTIVE PROTEIN: 56 mg/L — ABNORMAL HIGH (ref <=10.0)

## 2022-03-20 LAB — AST: AST (SGOT): 14 U/L (ref <=34)

## 2022-03-20 LAB — ALT: ALT (SGPT): 11 U/L (ref 10–49)

## 2022-03-20 LAB — BUN: BLOOD UREA NITROGEN: 9 mg/dL (ref 9–23)

## 2022-03-20 MED ADMIN — acetaminophen (TYLENOL) tablet 650 mg: 650 mg | ORAL | @ 15:00:00 | Stop: 2022-03-20

## 2022-03-20 MED ADMIN — inFLIXimab-axxq (AVSOLA) 10 mg/kg = 800 mg in sodium chloride (NS) 250 mL IVPB: 10 mg/kg | INTRAVENOUS | @ 17:00:00 | Stop: 2022-03-20

## 2022-03-20 MED ADMIN — cetirizine (ZYRTEC) tablet 10 mg: 10 mg | ORAL | @ 15:00:00 | Stop: 2022-03-20

## 2022-03-20 MED ADMIN — diphenhydrAMINE (BENADRYL) capsule/tablet 25 mg: 25 mg | ORAL | @ 15:00:00 | Stop: 2022-03-20

## 2022-03-20 MED ADMIN — sodium chloride (NS) 0.9 % infusion: 20 mL/h | INTRAVENOUS | @ 16:00:00 | Stop: 2022-03-20

## 2022-03-20 NOTE — Unmapped (Signed)
Cloth tape only    Patient presents for standard Avsola (infliximab-axxq) infusion.  In no acute distress. Vitals stable.  Reports no new medical issues or S/S of infection.  PIV placed.  See MAR for premeds. Two return appts made.      1139 Avsola (infliximab-axxq) 900 mg infusing as follows:      83ml/hr x 15 min  36ml/hr x 15 min  8ml/hr x 15 min  76ml/hr x 15 min  151ml/hr x 30 min  233ml/hr for the remainder of the infusion.      1350 Avsola  (infliximab) infusion complete.  PIV flushed with NS.  Vitals stable.  Patient without any s/s of adverse reaction.

## 2022-03-21 ENCOUNTER — Other Ambulatory Visit: Payer: Self-pay | Admitting: Nurse Practitioner

## 2022-03-21 ENCOUNTER — Other Ambulatory Visit: Payer: Self-pay | Admitting: Psychiatry

## 2022-03-21 ENCOUNTER — Ambulatory Visit: Admit: 2022-03-21 | Discharge: 2022-03-22 | Payer: PRIVATE HEALTH INSURANCE

## 2022-03-21 DIAGNOSIS — J310 Chronic rhinitis: Secondary | ICD-10-CM

## 2022-03-21 DIAGNOSIS — F3181 Bipolar II disorder: Secondary | ICD-10-CM

## 2022-03-21 DIAGNOSIS — F418 Other specified anxiety disorders: Secondary | ICD-10-CM

## 2022-03-21 MED ORDER — TRIAMCINOLONE ACETONIDE 0.1 % TOPICAL OINTMENT
5 refills | 0 days
Start: 2022-03-21 — End: ?

## 2022-03-21 NOTE — Unmapped (Signed)
POST-OP NOTE         Assessment:     The patient is doing well after excision of hidradenitis from bilateral groins with right posterior lateral thigh abscess drainage.  All sites are healing nicely and granulating well.       Plan:     Follow-up in 3 to 4 weeks for repeat evaluation.  Continue daily nonadherent dressing changes.       Subjective:     The patient denies any major concerns.  Her wounds are sore particularly in the left groin wound but she denies any fevers or infectious symptoms.  Drainage on the groin dressings persists daily, wounds are improving.       Objective:    Pulse 100  - Resp 18  - Ht 154.9 cm (5' 1)  - Wt 83.9 kg (185 lb)  - LMP 02/08/2022  - SpO2 98%  - BMI 34.96 kg/m??       Physical Exam:     On exam the patient is in no distress.  Her right lateral buttock wound is deep to the subcutaneous tissue and measures approximately 2 x 1-1/2 cm with healthy granulation tissue no abscess or cellulitis.  The bilateral groin areas were unroofed and only the epidermal tissue is missing, help the skin level granulation tissue is present with beginning epithelialization without abscess or cellulitis.      Problem List:    Patient Active Problem List   Diagnosis    Hidradenitis suppurativa

## 2022-03-22 MED ORDER — TRIAMCINOLONE ACETONIDE 0.1 % TOPICAL OINTMENT
5 refills | 0 days
Start: 2022-03-22 — End: ?

## 2022-03-24 NOTE — Unmapped (Signed)
[facility] Refill Coordination Note    Specialty Medication(s) to be Shipped:   Inflammatory Disorders: Cosentyx    Other medication(s) to be shipped: No additional medications requested for fill at this time     Melissa Pearson, DOB: 1991/01/12  Phone: [phone] (home) [phone] (work)      All above HIPAA information was verified with patient's family member, Mother.     Was a Nurse, learning disability used for this call? No    Completed refill call assessment today to schedule patient's medication shipment from the [facility] [phone]).  All relevant notes have been reviewed.     Specialty medication(s) and dose(s) confirmed: Regimen is correct and unchanged.   Changes to medications: Ashlen reports no changes at this time.  Changes to insurance: No  New side effects reported not previously addressed with a pharmacist or physician: None reported  Questions for the pharmacist: No    Confirmed patient received a Conservation officer, historic buildings and a Surveyor, mining with first shipment. The patient will receive a drug information handout for each medication shipped and additional FDA Medication Guides as required.       DISEASE/MEDICATION-SPECIFIC INFORMATION        For patients on injectable medications: Patient currently has 0 doses left.  Next injection is scheduled for 03/29/2022.    SPECIALTY MEDICATION ADHERENCE     Medication Adherence    Specialty Medication: COSENTYX PEN (2 PENS) 150 mg/mL  Patient is on additional specialty medications: No  Informant: mother                                Were doses missed due to medication being on hold? No    Cosentyx 150 mg/ml: 0 days of medicine on hand       REFERRAL TO PHARMACIST     Referral to the pharmacist: Not needed      [facility]     Shipping address confirmed in Epic.     Delivery Scheduled: Yes, Expected medication delivery date: 03/27/2022.     Medication will be delivered via UPS to the prescription address in Epic WAM.    [name]   [facility] Specialty Technician

## 2022-03-26 DIAGNOSIS — L408 Other psoriasis: Principal | ICD-10-CM

## 2022-03-26 NOTE — Unmapped (Signed)
Melissa Pearson 's COSENTYX PEN (2 PENS) 150 mg/mL Pnij injection (secukinumab) shipment will be delayed as a result of prior authorization now approved.     I have reached out to the patient  at [phone] and communicated the delay. We will wait for a call back from the patient to reschedule the delivery.  We have not confirmed the new delivery date.

## 2022-03-29 NOTE — Unmapped (Signed)
Spoke to mom of Melissa Pearson to reschedule delivery. Delivery date January 30th.

## 2022-04-02 MED FILL — COSENTYX PEN 300 MG/2 PENS (150 MG/ML) SUBCUTANEOUS: SUBCUTANEOUS | 28 days supply | Qty: 2 | Fill #4

## 2022-04-13 DIAGNOSIS — L732 Hidradenitis suppurativa: Principal | ICD-10-CM

## 2022-04-16 ENCOUNTER — Ambulatory Visit: Admit: 2022-04-16 | Discharge: 2022-04-17 | Payer: PRIVATE HEALTH INSURANCE

## 2022-04-16 DIAGNOSIS — L732 Hidradenitis suppurativa: Secondary | ICD-10-CM | POA: Diagnosis not present

## 2022-04-16 DIAGNOSIS — Z79899 Other long term (current) drug therapy: Secondary | ICD-10-CM | POA: Diagnosis not present

## 2022-04-16 MED ADMIN — cetirizine (ZYRTEC) tablet 10 mg: 10 mg | ORAL | @ 19:00:00 | Stop: 2022-04-16

## 2022-04-16 MED ADMIN — inFLIXimab-axxq (AVSOLA) 10 mg/kg = 800 mg in sodium chloride (NS) 250 mL IVPB: 10 mg/kg | INTRAVENOUS | @ 19:00:00 | Stop: 2022-04-16

## 2022-04-16 MED ADMIN — acetaminophen (TYLENOL) tablet 650 mg: 650 mg | ORAL | @ 19:00:00 | Stop: 2022-04-16

## 2022-04-16 MED ADMIN — diphenhydrAMINE (BENADRYL) capsule/tablet 25 mg: 25 mg | ORAL | @ 19:00:00 | Stop: 2022-04-16

## 2022-04-16 NOTE — Unmapped (Signed)
Patient presents for standard Avsola (infliximab-axxq) infusion.  In no acute distress. Vitals stable.  Reports no new medical issues or S/S of infection.  PIV placed.  See MAR for premeds.    1419 Avsola (infliximab-axxq) 800 mg infusing as follows:     56ml/hr x 15 min  10ml/hr x 15 min  65ml/hr x 15 min  69ml/hr x 15 min  167ml/hr x 30 min  297ml/hr for the remainder of the infusion.    1627 Avsola (infliximab-axxq) infusion complete.  PIV flushed with NS.  Vitals stable.  Patient without any s/s of adverse reaction.  IV d/c'd.  Patient discharged from Infusion Center.

## 2022-04-23 DIAGNOSIS — B3731 Acute candidiasis of vulva and vagina: Secondary | ICD-10-CM | POA: Diagnosis not present

## 2022-04-23 DIAGNOSIS — N760 Acute vaginitis: Secondary | ICD-10-CM | POA: Diagnosis not present

## 2022-04-23 DIAGNOSIS — Z7251 High risk heterosexual behavior: Secondary | ICD-10-CM | POA: Diagnosis not present

## 2022-04-23 DIAGNOSIS — Z01419 Encounter for gynecological examination (general) (routine) without abnormal findings: Secondary | ICD-10-CM | POA: Diagnosis not present

## 2022-04-23 DIAGNOSIS — Z124 Encounter for screening for malignant neoplasm of cervix: Secondary | ICD-10-CM | POA: Diagnosis not present

## 2022-04-23 DIAGNOSIS — Z6836 Body mass index (BMI) 36.0-36.9, adult: Secondary | ICD-10-CM | POA: Diagnosis not present

## 2022-04-26 ENCOUNTER — Ambulatory Visit: Admit: 2022-04-26 | Discharge: 2022-04-27 | Payer: PRIVATE HEALTH INSURANCE

## 2022-04-26 NOTE — Unmapped (Signed)
POST-OP NOTE         Assessment:     The patient is doing well after excision of hidradenitis from bilateral groins with right posterior lateral thigh abscess drainage.  All sites are healing nicely and granulating well, almost completely epithelialized       Plan:     Continue daily nonadherent dressing changes for focal open areas, follow-up as needed or with lack of complete epithelialization in short order.       Subjective:     The patient denies any major concerns.  Her wounds are notably improved and she denies any fevers or infectious symptoms.  D     Objective:    BP 94/62 (BP Site: L Wrist, BP Position: Sitting, BP Cuff Size: Medium)  - Pulse 108  - Ht 154.9 cm (5' 1)  - Wt 83.9 kg (185 lb)  - BMI 34.96 kg/m??       Physical Exam:     On exam the patient is in no distress.  Her right lateral buttock wound i completely epithelialized the bilateral groin areas were unroofed and only the epidermal tissue is missing, help the skin level granulation tissue is present with near complete epithelialization without abscess or cellulitis.      Problem List:    Patient Active Problem List   Diagnosis    Hidradenitis suppurativa

## 2022-05-01 NOTE — Unmapped (Signed)
[facility] Refill Coordination Note    Melissa Pearson, [place]: [birth year]  Phone: [phone] (home) [phone] (work)      All above HIPAA information was verified with patient.         04/27/2022     6:54 PM   Specialty Rx Medication Refill Questionnaire   Which Medications would you like refilled and shipped? Cosentyx 0 days   Please list all current allergies: Morphine   Have you missed any doses in the last 30 days? No   Have you had any changes to your medication(s) since your last refill? No   How many days remaining of each medication do you have at home? 0   If receiving an injectable medication, next injection date is 05/04/2022   Have you experienced any side effects in the last 30 days? No   Please enter the full address (street address, city, state, zip code) where you would like your medication(s) to be delivered to. [address], [place], [place] [address]   Please specify on which day you would like your medication(s) to arrive. Note: if you need your medication(s) within 3 days, please call the pharmacy to schedule your order at [phone]  05/03/2022   Has your insurance changed since your last refill? No   Would you like a pharmacist to call you to discuss your medication(s)? No   Do you require a signature for your package? (Note: if we are billing Medicare Part B or your order contains a controlled substance, we will require a signature) No         Completed refill call assessment today to schedule patient's medication shipment from the [facility] [phone]).  All relevant notes have been reviewed.       Confirmed patient received a Conservation officer, historic buildings and a Surveyor, mining with first shipment. The patient will receive a drug information handout for each medication shipped and additional FDA Medication Guides as required.         REFERRAL TO PHARMACIST     Referral to the pharmacist: Not needed      [facility]     Shipping address confirmed in Epic.     Delivery Scheduled: Yes, Expected medication delivery date: 3/5.     Medication will be delivered via UPS to the prescription address in Epic WAM.    Melissa Pearson   [facility] Specialty Technician

## 2022-05-07 MED FILL — COSENTYX PEN 300 MG/2 PENS (150 MG/ML) SUBCUTANEOUS: SUBCUTANEOUS | 28 days supply | Qty: 2 | Fill #5

## 2022-05-09 DIAGNOSIS — L732 Hidradenitis suppurativa: Principal | ICD-10-CM

## 2022-05-10 ENCOUNTER — Telehealth: Payer: Self-pay | Admitting: Nurse Practitioner

## 2022-05-10 ENCOUNTER — Other Ambulatory Visit: Payer: Self-pay

## 2022-05-10 MED ORDER — METHYLPHENIDATE HCL 20 MG PO TABS: 20.0000 mg | ORAL_TABLET | Freq: Every day | ORAL | 0 refills | Status: DC

## 2022-05-10 NOTE — Telephone Encounter (Signed)
Spoke to pt's mom. Scheduled pt for 3/13. Pt's mom asked could meds be refilled before visit due to her having five pills remaining? Call back # [phone]

## 2022-05-10 NOTE — Telephone Encounter (Signed)
RX sent in per provider.

## 2022-05-10 NOTE — Telephone Encounter (Signed)
LVM for patient to call back and schedule

## 2022-05-10 NOTE — Telephone Encounter (Signed)
Last visit 02/15/2022 Next Per Matt come back around 05/17/2022 Sending to support pool to get pt to schedule appointment.

## 2022-05-10 NOTE — Telephone Encounter (Signed)
Prescription Request  05/10/2022  LOV: 02/15/2022  What is the name of the medication or equipment? methylphenidate (RITALIN) 20 MG tablet    Have you contacted your pharmacy to request a refill? Yes  Pharmacy told pt to reach out to pcp for refill  Which pharmacy would you like this sent to?  [name] [id number]: [phone]: [phone]    Patient notified that their request is being sent to the clinical staff for review and that they should receive a response within 2 business days.   Please advise at Mobile [phone](mobile)

## 2022-05-14 ENCOUNTER — Ambulatory Visit: Admit: 2022-05-14 | Discharge: 2022-05-15 | Payer: PRIVATE HEALTH INSURANCE

## 2022-05-14 DIAGNOSIS — L732 Hidradenitis suppurativa: Secondary | ICD-10-CM | POA: Diagnosis not present

## 2022-05-14 LAB — CBC W/ AUTO DIFF
BASOPHILS ABSOLUTE COUNT: 0 10*9/L (ref 0.0–0.1)
BASOPHILS RELATIVE PERCENT: 0.2 %
EOSINOPHILS ABSOLUTE COUNT: 0.2 10*9/L (ref 0.0–0.5)
EOSINOPHILS RELATIVE PERCENT: 1.6 %
HEMATOCRIT: 31.6 % — ABNORMAL LOW (ref 34.0–44.0)
HEMOGLOBIN: 10.6 g/dL — ABNORMAL LOW (ref 11.3–14.9)
LYMPHOCYTES ABSOLUTE COUNT: 3.6 10*9/L (ref 1.1–3.6)
LYMPHOCYTES RELATIVE PERCENT: 24.6 %
MEAN CORPUSCULAR HEMOGLOBIN CONC: 33.5 g/dL (ref 32.0–36.0)
MEAN CORPUSCULAR HEMOGLOBIN: 28.8 pg (ref 25.9–32.4)
MEAN CORPUSCULAR VOLUME: 85.8 fL (ref 77.6–95.7)
MEAN PLATELET VOLUME: 7.3 fL (ref 6.8–10.7)
MONOCYTES ABSOLUTE COUNT: 1 10*9/L — ABNORMAL HIGH (ref 0.3–0.8)
MONOCYTES RELATIVE PERCENT: 6.6 %
NEUTROPHILS ABSOLUTE COUNT: 9.9 10*9/L — ABNORMAL HIGH (ref 1.8–7.8)
NEUTROPHILS RELATIVE PERCENT: 67 %
PLATELET COUNT: 362 10*9/L (ref 150–450)
RED BLOOD CELL COUNT: 3.69 10*12/L — ABNORMAL LOW (ref 3.95–5.13)
RED CELL DISTRIBUTION WIDTH: 14.6 % (ref 12.2–15.2)
WBC ADJUSTED: 14.8 10*9/L — ABNORMAL HIGH (ref 3.6–11.2)

## 2022-05-14 LAB — CREATININE
CREATININE: 0.64 mg/dL
EGFR CKD-EPI (2021) FEMALE: >90 mL/min/{1.73_m2} (ref >=60)

## 2022-05-14 LAB — C-REACTIVE PROTEIN: C-REACTIVE PROTEIN: 164 mg/L — ABNORMAL HIGH (ref <=10.0)

## 2022-05-14 LAB — ALT: ALT (SGPT): 9 U/L — ABNORMAL LOW (ref 10–49)

## 2022-05-14 LAB — SEDIMENTATION RATE: ERYTHROCYTE SEDIMENTATION RATE: 101 mm/h — ABNORMAL HIGH (ref 0–20)

## 2022-05-14 LAB — BUN: BLOOD UREA NITROGEN: 6 mg/dL — ABNORMAL LOW (ref 9–23)

## 2022-05-14 MED ADMIN — cetirizine (ZYRTEC) tablet 10 mg: 10 mg | ORAL | @ 15:00:00 | Stop: 2022-05-14

## 2022-05-14 MED ADMIN — inFLIXimab-axxq (AVSOLA) 10 mg/kg = 900 mg in sodium chloride (NS) 250 mL IVPB: 10 mg/kg | INTRAVENOUS | @ 15:00:00 | Stop: 2022-05-14

## 2022-05-14 MED ADMIN — acetaminophen (TYLENOL) tablet 650 mg: 650 mg | ORAL | @ 15:00:00 | Stop: 2022-05-14

## 2022-05-14 MED ADMIN — diphenhydrAMINE (BENADRYL) capsule/tablet 25 mg: 25 mg | ORAL | @ 15:00:00 | Stop: 2022-05-14

## 2022-05-14 NOTE — Unmapped (Signed)
Patient presents for standard Avsola (infliximab-axxq) infusion.  In no acute distress. Vitals stable.  Reports no new medical issues or S/S of infection.  PIV placed.  See MAR for premeds.    1056 Avsola (infliximab-axxq) 900 mg infusing as follows:     62ml/hr x 15 min  5ml/hr x 15 min  13ml/hr x 15 min  5ml/hr x 15 min  131ml/hr x 30 min  242ml/hr for the remainder of the infusion.    1256 Avsola (infliximab-axxq) infusion complete.  PIV flushed with NS.  Vitals stable.  Patient without any s/s of adverse reaction.  IV d/c'd.  Patient discharged from Infusion Center.

## 2022-05-15 ENCOUNTER — Ambulatory Visit: Admit: 2022-05-15 | Discharge: 2022-05-16 | Payer: PRIVATE HEALTH INSURANCE

## 2022-05-15 DIAGNOSIS — L732 Hidradenitis suppurativa: Principal | ICD-10-CM

## 2022-05-15 DIAGNOSIS — Z79899 Other long term (current) drug therapy: Principal | ICD-10-CM

## 2022-05-15 DIAGNOSIS — L408 Other psoriasis: Principal | ICD-10-CM

## 2022-05-15 DIAGNOSIS — B379 Candidiasis, unspecified: Principal | ICD-10-CM

## 2022-05-15 DIAGNOSIS — L400 Psoriasis vulgaris: Secondary | ICD-10-CM | POA: Diagnosis not present

## 2022-05-15 DIAGNOSIS — L02213 Cutaneous abscess of chest wall: Secondary | ICD-10-CM | POA: Diagnosis not present

## 2022-05-15 MED ORDER — FLUCONAZOLE 150 MG TABLET
ORAL_TABLET | Freq: Once | ORAL | 0 refills | 3.00000 days | Status: CP
Start: 2022-05-15 — End: 2022-05-15

## 2022-05-15 MED ORDER — HYDROCODONE 5 MG-ACETAMINOPHEN 325 MG TABLET
ORAL_TABLET | ORAL | 0 refills | 0.00000 days | Status: CP
Start: 2022-05-15 — End: ?

## 2022-05-15 MED ORDER — AMOXICILLIN 875 MG-POTASSIUM CLAVULANATE 125 MG TABLET
ORAL_TABLET | Freq: Two times a day (BID) | ORAL | 2 refills | 14.00000 days | Status: CP
Start: 2022-05-15 — End: 2022-06-14

## 2022-05-15 MED ORDER — BIMEKIZUMAB-BKZX 160 MG/ML SUBCUTANEOUS AUTO-INJECTOR
SUBCUTANEOUS | 0 refills | 0.00000 days | Status: CP
Start: 2022-05-15 — End: ?

## 2022-05-15 NOTE — Unmapped (Signed)
Dermatology Note     Assessment and Plan:      Hidradenitis Suppurativa Hurley Stage III, currently flaring with severe progression leading to risk to physical function with disability  Chronic condition with exacerbation or progression   Locations: axillae (R>L), inframammary, infra-pannus, groin, inner thighs, chest  Previously failed: Humira, Ertapenem X 12 week course completed 06/2021 (complication of PE requiring hospitalization - avoiding PICC lines in future due to this), Spironolactone (non-Derm provider discontinued due to risk of low blood pressure with other medication)  Previous surgeries: unroofing left axilla 06/2021, L & R inguinal crease with Dr. Orvan Falconer 2024  - Reviewed chronic, waxing/waning nature of condition and associated comorbid conditions (pilonidal cysts, IBD, psoriasis, acne, dissecting cellulitis) and potential for sinus tract formation and scarring.   - Continue Infliximab 10mg /kg every 4 weeks.   - Will plan to switch from cosentyx to bimekizumab, inject 320 mg subcutaneously every 4 weeks for first 16 weeks, then every 8 weeks thereafter  - Obtaining infliximab level and antibody screen with next lab draw at infusion  - Start amoxicillin-clavulanate 875-125 mg BID  - Refilled norco 5-325 mg q 6 hrs PRN, will refer to [place] [facility] for ongoing chronic pain management  - Continue triamcinolone ointment. Apply to inflamed areas of HS twice a day until improved, no longer than 2 weeks at a time. Then stop for two weeks. Restart as needed. Patient has this at home, does not need refill.   - Will plan to return to Dr. Orvan Falconer for consideration of unroofing of bilateral axillae    Abscess complex I&D, Central Chest  After the patient was informed of risks, benefits and side effects of incision and drainage, the patient elected to undergo the procedure. Informed verbal consent was obtained. Risk of scarring, etc with incision was explained. Incision was performed with a 6 mm punch after anesthesia with 2% lidocaine with epinephrine .  Scissor was used to break loculations.  Purulent material was expressed. Wound care was explained to the patient.      Inverse psoriasis: Improving, bilateral groin   - Switching from cosentyx to bimekizumab as above    High risk medication use, infliximab and starting cosentyx  Last Quant Gold 10/2021 negative.   - Monitoring labs every 4 weeks with infusions  - Obtaining infliximab level/ antibody with next lab draw    The patient was advised to call for an appointment should any new, changing, or symptomatic lesions develop.     RTC: 3-4 months  _________________________________________________________________      Chief Complaint     Follow-up of HS    HPI     Melissa Pearson is a 32 y.o. female who presents as a returning patient (last seen by Dr. Janyth Contes on 12/19/2021 ) to [facility] for follow-up of hidradenitis suppurativa.      At [facility], patient continued on infliximab 10mg /kg every 4 weeks, continued cosentyx for inverse psoriasis, continued clindamycin 300 mg BID, referral sent to Dr. Orvan Falconer for consideration of unroofing of L inguinal under anesthesia.       Today patient reports the following:   - Having flares today, chest, buttocks, vulva  - Had infliximab infusion yesterday  - Had unroofing L inguinal fold with Dr. Orvan Falconer, has improved but still having some flares there  - Tolerating cosentyx and infliximab well without side effects       The patient denies any other new or changing lesions or areas of concern.  Pertinent Past Medical History     No history of skin cancer    Family History:   Negative for melanoma    Past Medical History, Family History, Social History, Medication List, Allergies, and Problem List were reviewed in the rooming section of Epic.     ROS: Other than symptoms mentioned in the HPI, no fevers, chills, or other skin complaints    Physical Examination     Gen: Well-appearing but tearful patient, appropriate, interactive, in no acute distress  Skin: Examination of the scalp, face, neck, chest, back, abdomen, bilateral upper and lower extremities, hands, palms, soles, nails, buttocks, and external genitalia performed today and pertinent for:     location Abscess Inflamed nodule Non-inflamed nodule Draining tunnel Non-draining tunnel Hurley % scar   R axilla  1  2 3   80   L axilla       100   R Breast  2 1 2 1      L Breast  1        Intermammary 1 1        Pubic          R inguinal  1  1      R thigh          L inguinal  2  1      L thigh          Scrotum/Vulva          Perianal          R buttock          L buttock          Other (list)   Chest  1                    Inverse psoriasis: Erythema, maceration in bilateral inguinal folds with resolution of prior fissures     Pilonidal sinus? no

## 2022-05-15 NOTE — Unmapped (Addendum)
You are welcome to join the [facility]  [place] Support group online at [email or web address] or in-person at our regular meetings.  You can textHS to [id number] for meeting reminders or join the group online for regular updates.  This can be a great opportunity to interact and learn from other patients and help work with the HS community.  We hope to see your there!    Hidradentis Suppurativa (pronounced ???high-drad-en-eye-tis/sup-your-uh-tee-vah???) is a chronic disease of hair follicles.  The lesions occur most commonly on areas of skin-to-skin contact: under the arms (axillary area), in the groin, around the buttocks, in the region around the anus and genitals, and on the skin between and under the breasts. In women, the underarms, groin, and breast areas are most commonly affected. Men most often have HS lesions on the buttocks and under the arms and may also have HS at the back of the neck and behind and around the ears.    What does HS look and feel like?   The first thing that someone with HS notices is a tender, raised, red bump that looks like an under-the-skin pimple or boil. Sometimes HS lesions have two or more ???heads.???  In mild disease only an occasional boil or abscess may occur, but in more active disease there can be many new lesions every month.  Some abscesses can become larger and may open and drain pus.  Bleeding and increased odor can also occur. In severe disease, deeper abscesses develop and may connect with each other under the skin to form tunnel-like tracts (sinuses, fistulas).  These may drain constantly, or may temporarily improve and then usually begin draining again over time.  In people who have had sinus tracts for some time, scars form that feel like ropes under the skin. In the very worst cases, networks of sinus tracts can form deeper in the body, including the muscle and other tissues. Many people with severe HS have scars that can limit their ability to freely move their arms or legs, though this is very unlikely for most patients.     Clinicians usually classify or ???grade??? HS using the Hurley staging system according to the severity of the disease for each body location:   Hurley stage I: one or more abscesses are present, but no sinus tracts have formed and no scars have developed   Hurley stage II: one or more abscesses are present that resolve and recur; on sinus tract can be present and scarring is seen   Hurley stage III: many abscesses and more than one sinus tract is present with extensive scars.    What causes HS?  The cause of HS is not completely understood.  It seems to be a disorder of hair follicles and often many family members are affected so genetics probably play a strong role.  Bacteria are often present and may make the disease worse, but infection does not seem to be the main cause. Hormones are also likely play a role since the condition typically starts around puberty when hair follicles under the arms and in the groin start to change.  It can sometimes flare with menstrual cycles in women as well.  In most cases it lasts for decades and starts to improve to some extent in the late 30s and 40s as long as many fistulas have not already formed.  Women are three times more likely than men to develop HS.    Other factors are known to contribute to HS   flaring or becoming worse, though they are likely not the main causes. The factors most commonly associated with HS include:   Cigarette smoking - Stopping smoking will likely not cure the disease, but likely is helpful in reducing how much and how often it flares and may prevent it from getting as bad over time.   Higher weight - HS may occur even in people that are not overweight, but it is much more common in patients that are.  There is some evidence that losing weight and eating a diet low in sugars and fats may be helpful in improving hidradenitis, though this is not helpful for everyone.  Working with a nutritionist may be an important way to help with this and is something your physician can help coordinate    Hidradenitis is not contagious.  It is not caused by a problem with personal hygiene or any other activity or behavior of those with the disease.    How can your doctor help you treat your hidradenitis?  Clinicians use both medication and surgery to treat HS. The choice of treatment--or combination of treatments--is made according to an individual patient???s needs. Clinicians consider several factors in determining the most appropriate plan for therapy:   Severity of disease - medications and some laser treatments are usually able to control disease best when fistulas are not present.  Fistulas typically require surgery.   Extent and location of disease   Chronicity (how often the lesions recur)    A number of different surgical methods have been developed that are useful for certain patients under particular circumstances. These can be done with local numbing and healing at home for some areas when disease is not too extensive with relatively brief recovery times.  In more extensive disease there may be a need for larger excisions under general anesthesia with healing time in the hospital and prolonged recovery periods for better disease control.      In addition, many medical treatments have been tried--some with more success than others. No medication is effective for all patients, and you and your doctor may have to try several different treatments or combinations of treatments before you find the treatment plan that works best for you.  The goals of therapy with medications that are either topical (used on the skin) or systemic (taken by mouth) are:  1. to clear the lesions or at least reduce their number and extent, and  2. to prevent new lesions from forming.  3. To reduce pain, drainage, and odor  Some of the types of medications commonly used are antibacterial skin washes and the topical antibiotics to prevent secondary infections and corticosteroid injections into the lesions to reduce inflammation.     Other medications that may be used include retinoids (similar to Accutane), drugs that effect how hormones and hair follicles interact, drugs that affect your immune system (such as methotrexate, adalimumab/Humira, and Remicaid/infliximab), steroids, and oral antibiotics.    Lasers that destroy hair follicles can also be helpful since they reduce the hair follicles that cause the problems.  Multiple treatments are typically required over time and there is some discomfort associated with treatment, but it is typically very fast and well-tolerated.    It is very important to realize that hidradenitis cannot usually be completely cured with any single medication or surgical procedure.  It is a disease that can be very stubborn and difficult to control, but with good treatment a lot of improvement and sometimes temporary remissions can be   obtained. Poorly controlled disease can cause more fistulas to form and make managing the disease much more difficult over time so it is important to seek care to reduce major flares.  Surgery can provide a long term cure in some areas, though the disease can start again or continue in nearby areas.  A dermatologist is often the best person to help coordinate disease treatment, and sometimes other surgeons, pain specialists, other specialists, and nutritionists may be part of the treatment team.    For severe disease, the first goal is often to reduce pain and symptoms with medicines so that the disease feels more stable. Once it's stable, we often start thinking about how to address areas that have completely gotten better with surgery if they are still causing problems.    What can you do to help your HS?  1. Stopping smoking is hard and may not fix everything, but it may be a step in the right direction.  We or your primary care physician can provide resources to help stop if you are interested.  2. Follow a healthy diet and try to achieve a healthy weight.  Some other self-help measures are:   Keep your skin cool and dry (becoming overheated and sweating can contribute to an HS flare)   To reduce the pain of cysts or nodules or to help them to drain, apply hot compresses or soak in hot water for 10 minutes at a time (use a clean washcloth or a teabag soaked in hot water)   For female patients, cotton underwear that does not have tight elastic in the groin can be helpful.  Boyshort, brief, or boxer style underwear may be a better option as friction on hair follicles in affected areas can be a major trigger in some patients.  These can be easily found on Amazon or with some retailers.  Fruit of the Loom and Underworks are two brands that are sometimes recommended.    Finally, know that you are not alone. Coping with the pain and other symptoms of HS can be very difficult, so it may be helpful to connect with others who live with HS. Patient groups and networks can be sources of important information and support. Some internet resources for information and connections are provided below.    Psychologytoday.com is a resource to find psychologists and therapists that can help support you in your are     AASECT.org can help connect with sexual health resources and counselors    Resources for Information    The Hidradenitis Suppurativa Foundation: A nonprofit organized by a group of physicians interested in treating and advancing research in hidradenitis suppurativa.  This group advocates for better care and research for hidradenitis and has educational materials put together specifically for patients that have been reviewed and produced by doctors and people with hidradenitis.    American Academy of Dermatology  http://www.aad.org/dermatology-a-to-z/diseases-and-treatments/e---h/hidradenitis-suppurativa/signs-and-symptoms    National Library of Medicine  http://www.nlm.nih.gov/medlineplus/hidradenitissuppurativa.html  NORD: National Organization for Rare Disorders, Inc  https://www.rarediseases.org/rare-disease-information/rare-diseases/byID/358/viewAbstract  Trials of new medications for HS  Https://www.clinicaltrials.gov

## 2022-05-15 NOTE — Unmapped (Signed)
Self-reported severity (0-5): 5  VAS pain today: 10  VAS average pain for the last month:  -  Requiring pain medication? Yes.  If so, what type/frequency? Remicade  How often in pain?  continuously  Level of odor (0-5): 4  Level of itching (0-5): 5  Dressing changes needed for drainage:Several times a day  How much drainage: a lot of drainage  Flare in the last month (Y/N)? Yes.  How long ago was the last flare? in last 6 months  Developing new lesions? once a month  Number of inflammatory lesions montly: 3-5  DLQI: 30  Current treatment:      Remicade                                 How helpful is the current treatment in managing the following aspects of your disease?  Not at all helpful Somewhat helpful Very helpful   Pain             x    Decreasing length of flares            x    Decreasing new lesions  x    Drainage  x    Decreasing frequency of flares  x    Decreasing severity of flares  x    Odor  x

## 2022-05-16 ENCOUNTER — Ambulatory Visit (INDEPENDENT_AMBULATORY_CARE_PROVIDER_SITE_OTHER): Payer: BC Managed Care – PPO | Admitting: Nurse Practitioner

## 2022-05-16 ENCOUNTER — Encounter: Payer: Self-pay | Admitting: Nurse Practitioner

## 2022-05-16 VITALS — BP 96/62 | HR 105 | Temp 98.2°F | Resp 16 | Ht 61.0 in | Wt 195.5 lb

## 2022-05-16 DIAGNOSIS — H6093 Unspecified otitis externa, bilateral: Secondary | ICD-10-CM | POA: Diagnosis not present

## 2022-05-16 DIAGNOSIS — J4520 Mild intermittent asthma, uncomplicated: Secondary | ICD-10-CM

## 2022-05-16 DIAGNOSIS — F901 Attention-deficit hyperactivity disorder, predominantly hyperactive type: Secondary | ICD-10-CM | POA: Diagnosis not present

## 2022-05-16 MED ORDER — METHYLPHENIDATE HCL 20 MG PO TABS: 20.0000 mg | ORAL_TABLET | Freq: Every day | ORAL | 0 refills | Status: DC

## 2022-05-16 MED ORDER — MONTELUKAST SODIUM 10 MG PO TABS: ORAL_TABLET | ORAL | 0 refills | Status: AC

## 2022-05-16 MED ORDER — OFLOXACIN 0.3 % OT SOLN: 10.0000 [drp] | Freq: Every day | OTIC | 0 refills | Status: DC

## 2022-05-16 MED ORDER — ALBUTEROL SULFATE HFA 108 (90 BASE) MCG/ACT IN AERS: 2.0000 | INHALATION_SPRAY | Freq: Four times a day (QID) | RESPIRATORY_TRACT | 0 refills | Status: DC | PRN

## 2022-05-16 NOTE — Assessment & Plan Note (Signed)
Long history with ears.  Does have ENT but not until May.  Will treat with ofloxacin 10 drops daily bilateral ears.

## 2022-05-16 NOTE — Assessment & Plan Note (Signed)
Patient maintained and doing well on methylphenidate 20 mg daily.  Will use 10 mg IR as needed states she just wants to see me last time in office.  No red flags today PDMP reviewed refills provided.

## 2022-05-16 NOTE — Progress Notes (Signed)
Established Patient Office Visit  Subjective   Patient ID: Courtney [name], female    DOB: 10/13/1990  Age: 32 y.o. MRN: [id number]  Chief Complaint  Patient presents with   Medication Refill     ADD/ADHD: Patient was seen by me on 02/15/2022 to establish care.  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 Sometimes she will do 12 hours Patient is here for follow-up on medication States that the medication is doing well.   Ear: states she feels that they are soupy. States that she is getitn discharge out of them. States that she can feel it running at night. States that she can get yellow out with a q tip. States the entrance is sore. States that it has been over the past mont or longer. States ENT in may    Review of Systems  Constitutional:  Negative for chills and fever.  HENT:  Positive for ear discharge and ear pain.   Respiratory:  Negative for shortness of breath.   Cardiovascular:  Negative for chest pain.  Neurological:  Negative for headaches.  Psychiatric/Behavioral:  Negative for hallucinations and suicidal ideas. The patient does not have insomnia.       Objective:     BP 96/62   Pulse (!) 105   Temp 98.2 F (36.8 C)   Resp 16   Ht '5\' 1"'$  (1.549 m)   Wt 195 lb 8 oz (88.7 kg)   SpO2 99%   BMI 36.94 kg/m  BP Readings from Last 3 Encounters:  05/16/22 96/62  02/28/22 91/69  02/15/22 128/80   Wt Readings from Last 3 Encounters:  05/16/22 195 lb 8 oz (88.7 kg)  02/15/22 191 lb (86.6 kg)  12/04/21 193 lb 4 oz (87.7 kg)      Physical Exam Vitals and nursing note reviewed.  Constitutional:      Appearance: Normal appearance.  HENT:     Right Ear: Tympanic membrane and external ear normal. Drainage and tenderness present.     Left Ear: Tympanic membrane and external ear normal. Drainage and  tenderness present.  Cardiovascular:     Rate and Rhythm: Normal rate and regular rhythm.     Heart sounds: Normal heart sounds.  Pulmonary:     Effort: Pulmonary effort is normal.     Breath sounds: Normal breath sounds.  Neurological:     Mental Status: She is alert.      No results found for any visits on 05/16/22.    The ASCVD Risk score (Arnett DK, et al., 2019) failed to calculate for the following reasons:   The 2019 ASCVD risk score is only valid for ages 14 to 82    Assessment & Plan:   Problem List Items Addressed This Visit       Respiratory   Reactive airway disease    Patient request albuterol inhaler refill.  Refill provided.      Relevant Medications   montelukast (SINGULAIR) 10 MG tablet   albuterol (VENTOLIN HFA) 108 (90 Base) MCG/ACT inhaler     Nervous and Auditory   Otitis externa of both ears - Primary    Long history with ears.  Does have ENT but not until May.  Will treat with ofloxacin 10 drops daily bilateral ears.      Relevant Medications   ofloxacin (FLOXIN)  0.3 % OTIC solution     Other   Attention deficit hyperactivity disorder (ADHD), predominantly hyperactive type    Patient maintained and doing well on methylphenidate 20 mg daily.  Will use 10 mg IR as needed states she just wants to see me last time in office.  No red flags today PDMP reviewed refills provided.      Relevant Medications   methylphenidate (RITALIN) 20 MG tablet   methylphenidate (RITALIN) 20 MG tablet   methylphenidate (RITALIN) 20 MG tablet    Return in about 3 months (around 08/16/2022) for CPE and Labs.    Romilda Garret, NP

## 2022-05-16 NOTE — Assessment & Plan Note (Signed)
Patient request albuterol inhaler refill.  Refill provided.

## 2022-05-16 NOTE — Patient Instructions (Signed)
Nice to see you today I want to do your physical at the next office visit and labs Follow up in 3 months

## 2022-05-16 NOTE — Unmapped (Signed)
I saw and evaluated the patient, participating in the key portions of the service.  I reviewed the resident’s note.  I agree with the resident’s findings and plan. Ryheem Jay J Azelyn Batie, MD

## 2022-05-19 DIAGNOSIS — L732 Hidradenitis suppurativa: Principal | ICD-10-CM

## 2022-05-19 MED ORDER — SULFAMETHOXAZOLE 800 MG-TRIMETHOPRIM 160 MG TABLET
ORAL_TABLET | 3 refills | 0.00000 days
Start: 2022-05-19 — End: ?

## 2022-05-21 DIAGNOSIS — L732 Hidradenitis suppurativa: Principal | ICD-10-CM

## 2022-05-21 DIAGNOSIS — L408 Other psoriasis: Principal | ICD-10-CM

## 2022-05-21 MED ORDER — SULFAMETHOXAZOLE 800 MG-TRIMETHOPRIM 160 MG TABLET
ORAL_TABLET | ORAL | 3 refills | 0.00000 days | Status: CP
Start: 2022-05-21 — End: ?

## 2022-05-23 ENCOUNTER — Other Ambulatory Visit: Payer: Self-pay | Admitting: Psychiatry

## 2022-05-23 DIAGNOSIS — F3181 Bipolar II disorder: Secondary | ICD-10-CM

## 2022-05-27 ENCOUNTER — Emergency Department (HOSPITAL_COMMUNITY)
Admission: EM | Admit: 2022-05-27 | Discharge: 2022-05-27 | Disposition: A | Discharge status: Home / Self Care | Payer: BC Managed Care – PPO | Attending: Emergency Medicine | Admitting: Emergency Medicine

## 2022-05-27 ENCOUNTER — Encounter (HOSPITAL_COMMUNITY): Payer: Self-pay

## 2022-05-27 ENCOUNTER — Other Ambulatory Visit: Payer: Self-pay

## 2022-05-27 ENCOUNTER — Emergency Department (HOSPITAL_COMMUNITY): Payer: BC Managed Care – PPO

## 2022-05-27 DIAGNOSIS — R109 Unspecified abdominal pain: Secondary | ICD-10-CM | POA: Insufficient documentation

## 2022-05-27 DIAGNOSIS — R079 Chest pain, unspecified: Secondary | ICD-10-CM | POA: Insufficient documentation

## 2022-05-27 DIAGNOSIS — D72829 Elevated white blood cell count, unspecified: Secondary | ICD-10-CM | POA: Diagnosis not present

## 2022-05-27 DIAGNOSIS — Z1152 Encounter for screening for COVID-19: Secondary | ICD-10-CM | POA: Diagnosis not present

## 2022-05-27 DIAGNOSIS — R509 Fever, unspecified: Secondary | ICD-10-CM | POA: Diagnosis not present

## 2022-05-27 DIAGNOSIS — R112 Nausea with vomiting, unspecified: Secondary | ICD-10-CM | POA: Diagnosis not present

## 2022-05-27 DIAGNOSIS — L732 Hidradenitis suppurativa: Secondary | ICD-10-CM | POA: Diagnosis not present

## 2022-05-27 DIAGNOSIS — R0789 Other chest pain: Secondary | ICD-10-CM | POA: Diagnosis not present

## 2022-05-27 DIAGNOSIS — R1031 Right lower quadrant pain: Secondary | ICD-10-CM | POA: Diagnosis not present

## 2022-05-27 DIAGNOSIS — R059 Cough, unspecified: Secondary | ICD-10-CM | POA: Diagnosis not present

## 2022-05-27 LAB — URINALYSIS, ROUTINE W REFLEX MICROSCOPIC
Bilirubin Urine: NEGATIVE
Glucose, UA: NEGATIVE mg/dL
Ketones, ur: NEGATIVE mg/dL
Leukocytes,Ua: NEGATIVE
Nitrite: NEGATIVE
Protein, ur: NEGATIVE mg/dL
Specific Gravity, Urine: 1.006 (ref 1.005–1.030)
pH: 5 (ref 5.0–8.0)

## 2022-05-27 LAB — COMPREHENSIVE METABOLIC PANEL
ALT: 15 U/L (ref 0–44)
AST: 22 U/L (ref 15–41)
Albumin: 2.6 g/dL — ABNORMAL LOW (ref 3.5–5.0)
Alkaline Phosphatase: 101 U/L (ref 38–126)
Anion gap: 13 (ref 5–15)
BUN: 5 mg/dL — ABNORMAL LOW (ref 6–20)
CO2: 22 mmol/L (ref 22–32)
Calcium: 8.9 mg/dL (ref 8.9–10.3)
Chloride: 101 mmol/L (ref 98–111)
Creatinine, Ser: 0.68 mg/dL (ref 0.44–1.00)
GFR, Estimated: >60 mL/min (ref >60)
Glucose, Bld: 126 mg/dL — ABNORMAL HIGH (ref 70–99)
Potassium: 3.7 mmol/L (ref 3.5–5.1)
Sodium: 136 mmol/L (ref 135–145)
Total Bilirubin: 0.4 mg/dL (ref 0.3–1.2)
Total Protein: 8.8 g/dL — ABNORMAL HIGH (ref 6.5–8.1)

## 2022-05-27 LAB — CBC WITH DIFFERENTIAL/PLATELET
Abs Immature Granulocytes: 0.05 10*3/uL (ref 0.00–0.07)
Basophils Absolute: 0.1 10*3/uL (ref 0.0–0.1)
Basophils Relative: 1 %
Eosinophils Absolute: 0.3 10*3/uL (ref 0.0–0.5)
Eosinophils Relative: 2 %
HCT: 35.9 % — ABNORMAL LOW (ref 36.0–46.0)
Hemoglobin: 11.4 g/dL — ABNORMAL LOW (ref 12.0–15.0)
Immature Granulocytes: 0 %
Lymphocytes Relative: 35 %
Lymphs Abs: 4.9 10*3/uL — ABNORMAL HIGH (ref 0.7–4.0)
MCH: 28.4 pg (ref 26.0–34.0)
MCHC: 31.8 g/dL (ref 30.0–36.0)
MCV: 89.3 fL (ref 80.0–100.0)
Monocytes Absolute: 0.9 10*3/uL (ref 0.1–1.0)
Monocytes Relative: 6 %
Neutro Abs: 7.8 10*3/uL — ABNORMAL HIGH (ref 1.7–7.7)
Neutrophils Relative %: 56 %
Platelets: 389 10*3/uL (ref 150–400)
RBC: 4.02 MIL/uL (ref 3.87–5.11)
RDW: 14 % (ref 11.5–15.5)
WBC: 14.1 10*3/uL — ABNORMAL HIGH (ref 4.0–10.5)
nRBC: 0 % (ref 0.0–0.2)

## 2022-05-27 LAB — RESP PANEL BY RT-PCR (RSV, FLU A&B, COVID)  RVPGX2
Influenza A by PCR: NEGATIVE
Influenza B by PCR: NEGATIVE
Resp Syncytial Virus by PCR: NEGATIVE
SARS Coronavirus 2 by RT PCR: NEGATIVE

## 2022-05-27 LAB — I-STAT BETA HCG BLOOD, ED (MC, WL, AP ONLY): I-stat hCG, quantitative: <5 m[IU]/mL (ref <5)

## 2022-05-27 LAB — LIPASE, BLOOD: Lipase: 57 U/L — ABNORMAL HIGH (ref 11–51)

## 2022-05-27 LAB — D-DIMER, QUANTITATIVE: D-Dimer, Quant: 0.64 ug/mL-FEU — ABNORMAL HIGH (ref 0.00–0.50)

## 2022-05-27 LAB — TROPONIN I (HIGH SENSITIVITY): Troponin I (High Sensitivity): 3 ng/L (ref <18)

## 2022-05-27 MED ORDER — ONDANSETRON 4 MG PO TBDP: 4.0000 mg | ORAL_TABLET | Freq: Three times a day (TID) | ORAL | 0 refills | Status: DC | PRN

## 2022-05-27 MED ORDER — IOHEXOL 350 MG/ML SOLN
75.0000 mL | Freq: Once | INTRAVENOUS | Status: AC | PRN
  Administered 2022-05-27: 75 mL via INTRAVENOUS

## 2022-05-27 MED ORDER — SODIUM CHLORIDE 0.9 % IV BOLUS
1000.0000 mL | Freq: Once | INTRAVENOUS | Status: AC
  Administered 2022-05-27: 1000 mL via INTRAVENOUS

## 2022-05-27 MED ORDER — ONDANSETRON HCL 4 MG/2ML IJ SOLN
4.0000 mg | Freq: Once | INTRAMUSCULAR | Status: AC
  Administered 2022-05-27: 4 mg via INTRAVENOUS
  Filled 2022-05-27: qty 2

## 2022-05-27 NOTE — ED Triage Notes (Signed)
Pt came to ED for SOB that started today. Pt has been vomiting since yesterday. Pt denies diarrhea. Axox4. VSS.

## 2022-05-27 NOTE — ED Provider Notes (Signed)
[name] Provider Note   CSN: [id number] Arrival date & time: 05/27/22  1138     History  Chief Complaint  Patient presents with   Shortness of Breath   Emesis    Courtney [name] is a 32 y.o. female with a past medical history significant for bipolar disorder, ADHD, history of PE not currently on any anticoagulants who presents to the ED due to shortness of breath, chest pain, abdominal pain, and 3 episodes of nonbloody, nonbilious emesis.  Patient admits to a cough for the past few days.  She notes she developed a fever yesterday.  Also endorses chest pain and shortness of breath which she says feels similar to her previous PE.  Patient had a PE in 2023 which was thought to be secondary to sepsis, estrogen OCP, and vaping.  Patient is not currently on any estrogen hormones. Denies lower extremity edema.  No recent long travel or surgeries.  Denies history of asthma.  Also admits to upper and RLQ abdominal pain. NO diarrhea.   History obtained from patient and past medical records. No interpreter used during encounter.       Home Medications Prior to Admission medications   Medication Sig Start Date End Date Taking? Authorizing Provider  ondansetron (ZOFRAN-ODT) 4 MG disintegrating tablet Take 1 tablet (4 mg total) by mouth every 8 (eight) hours as needed for nausea or vomiting. 05/27/22  Yes Amarea Macdowell, Druscilla Brownie, PA-C  albuterol (VENTOLIN HFA) 108 (90 Base) MCG/ACT inhaler Inhale 2 puffs into the lungs every 6 (six) hours as needed for wheezing or shortness of breath. 05/16/22   Michela Pitcher, NP  ARIPiprazole (ABILIFY) 2 MG tablet TAKE 1 TABLET BY MOUTH EVERY DAY Patient not taking: Reported on 05/16/2022 02/12/22   Ursula Alert, MD  azelastine (ASTELIN) 0.1 % nasal spray Place 1 spray into both nostrils 2 (two) times daily. Use in each nostril as directed Patient not taking: Reported on 05/16/2022 02/15/22   Michela Pitcher, NP   benzonatate (TESSALON) 100 MG capsule Take 1-2 tablets 3 times a day as needed for cough 02/28/22   Immordino, Annie Main, FNP  bifidobacterium infantis (ALIGN) capsule Take 1 capsule by mouth daily.    [provider]  busPIRone (BUSPAR) 10 MG tablet TAKE 1 TABLET BY MOUTH TWICE A DAY 02/06/22   Ursula Alert, MD  ciprofloxacin-dexamethasone (CIPRODEX) OTIC suspension PLACE 4 DROPS INTO BOTH EARS 2 TIMES DAILY FOR 10 DAYS. Patient not taking: Reported on 05/16/2022    [provider]  clindamycin (CLEOCIN) 300 MG capsule Take 300 mg by mouth 2 (two) times daily. 11/09/21   [provider]  COSENTYX SENSOREADY, 300 MG, 150 MG/ML SOAJ Inject 2 Syringes into the skin every 28 (twenty-eight) days. 10/30/21   [provider]  diphenhydrAMINE (BENADRYL) 25 mg capsule 25 mg every 6 (six) hours as needed.    [provider]  EPINEPHrine (EPIPEN 2-PAK) 0.3 mg/0.3 mL IJ SOAJ injection Inject 0.3 mg into the muscle as needed for anaphylaxis. 02/15/22   Michela Pitcher, NP  glucose blood (CONTOUR TEST) test strip And lancets #100 05/18/21   Renato Shin, MD  HYDROcodone-acetaminophen (NORCO/VICODIN) 5-325 MG tablet Take 1 tablet by mouth every 6 (six) hours as needed.    [provider]  hydrOXYzine (ATARAX) 25 MG tablet TAKE 1 TABLET BY MOUTH EVERY 8 HOURS AS NEEDED FOR ITCHING. 01/16/22   Dutch Quint B, FNP  inFLIXimab (REMICADE IV) Inject 1  Dose into the vein every 30 (thirty) days.    [provider]  Iron-Vitamin C 100-250 MG TABS Take 1 tablet by mouth daily.    [provider]  lamoTRIgine (LAMICTAL) 100 MG tablet TAKE 0.5 TABLETS BY MOUTH 2 TIMES DAILY. 05/24/22   Ursula Alert, MD  levocetirizine (XYZAL) 5 MG tablet Take 1 tablet (5 mg total) by mouth every evening. Patient not taking: Reported on 05/16/2022 02/15/22   Michela Pitcher, NP  methylphenidate (RITALIN) 20 MG tablet Take 0.5 tablets (10 mg total) by mouth daily as needed.  02/15/22   Michela Pitcher, NP  methylphenidate (RITALIN) 20 MG tablet Take 1 tablet (20 mg total) by mouth daily before breakfast. 05/16/22 06/15/22  Michela Pitcher, NP  methylphenidate (RITALIN) 20 MG tablet Take 1 tablet (20 mg total) by mouth daily before breakfast. NEED OFFICE VISIT FOR FURTHER REFILLS 05/16/22 06/15/22  Michela Pitcher, NP  methylphenidate (RITALIN) 20 MG tablet Take 1 tablet (20 mg total) by mouth daily before breakfast. 05/16/22   Michela Pitcher, NP  Microlet Lancets MISC See admin instructions. 05/18/21   [provider]  montelukast (SINGULAIR) 10 MG tablet TAKE 1 TABLET BY MOUTH EVERYDAY AT BEDTIME 05/16/22   Michela Pitcher, NP  norethindrone (MICRONOR) 0.35 MG tablet Take 1 tablet by mouth daily. 07/19/21   [provider]  ofloxacin (FLOXIN) 0.3 % OTIC solution Place 10 drops into both ears daily. 05/16/22   Michela Pitcher, NP  ondansetron (ZOFRAN-ODT) 4 MG disintegrating tablet Take 1 tablet (4 mg total) by mouth every 8 (eight) hours as needed for nausea or vomiting. 10/24/21   Volney American, PA-C  oseltamivir (TAMIFLU) 75 MG capsule Take 1 capsule (75 mg total) by mouth every 12 (twelve) hours. Patient not taking: Reported on 05/16/2022 02/28/22   Immordino, Annie Main, FNP  traMADol (ULTRAM) 50 MG tablet Take 1 tablet (50 mg total) by mouth every six (6) hours as needed for pain. 08/08/21     traZODone (DESYREL) 50 MG tablet TAKE 1 TABLET BY MOUTH EVERYDAY AT BEDTIME 11/10/21   Waunita Schooner, MD      Allergies    Other, Shellfish allergy, Shellfish-derived products, Tapentadol, Tape, Ginger, Morphine and related, and Vancomycin    Review of Systems   Review of Systems  Constitutional:  Positive for fever.  Respiratory:  Positive for cough and shortness of breath.   Cardiovascular:  Positive for chest pain. Negative for leg swelling.  Gastrointestinal:  Positive for abdominal pain, nausea and vomiting. Negative for diarrhea.    Physical Exam Updated  Vital Signs BP 107/75   Pulse 84   Temp 97.9 F (36.6 C) (Oral)   Resp (!) 24   Ht 5\' 1"  (1.549 m)   Wt 88.5 kg   LMP  (LMP Unknown) Comment: Pt cannot recall last LMP and states periods are irregular  SpO2 100%   BMI 36.84 kg/m  Physical Exam Vitals and nursing note reviewed.  Constitutional:      General: She is not in acute distress.    Appearance: She is not ill-appearing.  HENT:     Head: Normocephalic.  Eyes:     Pupils: Pupils are equal, round, and reactive to light.  Cardiovascular:     Rate and Rhythm: Normal rate and regular rhythm.     Pulses: Normal pulses.     Heart sounds: Normal heart sounds. No murmur heard.    No friction rub. No gallop.  Pulmonary:  Effort: Pulmonary effort is normal.     Breath sounds: Normal breath sounds.  Abdominal:     General: Abdomen is flat. There is no distension.     Palpations: Abdomen is soft.     Tenderness: There is abdominal tenderness. There is no guarding or rebound.     Comments: Epigastric, RUQ and RLQ tenderness without rebound or guarding.  Musculoskeletal:        General: Normal range of motion.     Cervical back: Neck supple.  Skin:    General: Skin is warm and dry.  Neurological:     General: No focal deficit present.     Mental Status: She is alert.  Psychiatric:        Mood and Affect: Mood normal.        Behavior: Behavior normal.     ED Results / Procedures / Treatments   Labs (all labs ordered are listed, but only abnormal results are displayed) Labs Reviewed  CBC WITH DIFFERENTIAL/PLATELET - Abnormal; Notable for the following components:      Result Value   WBC 14.1 (*)    Hemoglobin 11.4 (*)    HCT 35.9 (*)    Neutro Abs 7.8 (*)    Lymphs Abs 4.9 (*)    All other components within normal limits  COMPREHENSIVE METABOLIC PANEL - Abnormal; Notable for the following components:   Glucose, Bld 126 (*)    BUN 5 (*)    Total Protein 8.8 (*)    Albumin 2.6 (*)    All other components within  normal limits  LIPASE, BLOOD - Abnormal; Notable for the following components:   Lipase 57 (*)    All other components within normal limits  D-DIMER, QUANTITATIVE (NOT AT [facility]) - Abnormal; Notable for the following components:   D-Dimer, Quant 0.64 (*)    All other components within normal limits  RESP PANEL BY RT-PCR (RSV, FLU A&B, COVID)  [id number]  URINALYSIS, ROUTINE W REFLEX MICROSCOPIC  I-STAT BETA HCG BLOOD, ED ([facility], [facility], [facility] ONLY)  TROPONIN I (HIGH SENSITIVITY)    EKG EKG Interpretation  Date/Time:  Sunday May 27 2022 12:17:55 EDT Ventricular Rate:  81 PR Interval:  129 QRS Duration: 97 QT Interval:  377 QTC Calculation: 438 R Axis:   60 Text Interpretation: Sinus rhythm Low voltage, precordial leads No significant change since last tracing Confirmed by Blanchie Dessert [phone]) on 05/27/2022 1:54:32 PM  Radiology CT Angio Chest PE W and/or Wo Contrast  Result Date: 05/27/2022 CLINICAL DATA:  Pulmonary embolism (PE) suspected, high prob; RLQ abdominal pain EXAM: CT ANGIOGRAPHY CHEST CT ABDOMEN AND PELVIS WITH CONTRAST TECHNIQUE: Multidetector CT imaging of the chest was performed using the standard protocol during bolus administration of intravenous contrast. Multiplanar CT image reconstructions and MIPs were obtained to evaluate the vascular anatomy. Multidetector CT imaging of the abdomen and pelvis was performed using the standard protocol during bolus administration of intravenous contrast. RADIATION DOSE REDUCTION: This exam was performed according to the departmental dose-optimization program which includes automated exposure control, adjustment of the mA and/or kV according to patient size and/or use of iterative reconstruction technique. CONTRAST:  82mL OMNIPAQUE IOHEXOL 350 MG/ML SOLN COMPARISON:  07/02/2021 FINDINGS: CTA CHEST FINDINGS Cardiovascular: Satisfactory opacification of the pulmonary arteries to the segmental level. No evidence of acute pulmonary embolism. The  previously seen bilateral lower lobe pulmonary emboli have resolved. Thoracic aorta is normal in course and caliber. Normal heart size. No pericardial effusion. Mediastinum/Nodes: Mildly enlarged bilateral  axillary lymph nodes measuring up to 1.1 cm in diameter on each side. No mediastinal or hilar lymphadenopathy. Thyroid gland, trachea, and esophagus demonstrate no significant findings. Lungs/Pleura: Lungs are clear. No pleural effusion or pneumothorax. Musculoskeletal: There is a new area abnormal skin thickening overlying the sternum with fluid in the subcutaneous soft tissues. This area measures approximately 7.0 x 1.7 x 4.0 cm (series 3, image 27; series 7, image 95). No soft tissue gas. No acute osseous abnormality. Review of the MIP images confirms the above findings. CT ABDOMEN and PELVIS FINDINGS Hepatobiliary: 9 mm low-density lesion within the inferior right hepatic lobe medially (series 5, image 32), too small to definitively characterize. This was faintly visible on the previous exam. Liver appears otherwise unremarkable. Partially contracted gallbladder. No hyperdense gallstone. No biliary dilatation. Pancreas: Unremarkable. No pancreatic ductal dilatation or surrounding inflammatory changes. Spleen: Normal in size without focal abnormality. Adrenals/Urinary Tract: Unremarkable adrenal glands. Kidneys enhance symmetrically without focal lesion, stone, or hydronephrosis. Ureters are nondilated. Urinary bladder appears unremarkable for the degree of distention. Stomach/Bowel: Stomach is within normal limits. Appendix appears normal (series 5, image 72). No evidence of bowel wall thickening, distention, or inflammatory changes. Vascular/Lymphatic: No significant vascular abnormality. No pathologically enlarged intra-or pelvic lymph nodes. Enlarged bilateral inguinal lymph nodes measuring up to 1.8 cm on the left and 1.7 cm on the right. These have slightly increased in size compared to the previous exam.  There is focal skin thickening along the right inguinal crease. Reproductive: Uterus and bilateral adnexa are unremarkable. Other: No free fluid. No abdominopelvic fluid collection. No pneumoperitoneum. No abdominal wall hernia. Musculoskeletal: No acute or significant osseous findings. Review of the MIP images confirms the above findings. IMPRESSION: 1. No evidence of acute pulmonary embolism. The previously seen bilateral lower lobe pulmonary emboli have resolved. 2. New area of abnormal skin thickening overlying the sternum with fluid in the subcutaneous soft tissues. This area measures approximately 7.0 x 1.7 x 4.0 cm. Appearance suspicious for cellulitis and superficial abscess. Correlate with physical exam. 3. Mildly enlarged bilateral axillary and inguinal lymph nodes, likely reactive. 4. No acute findings within the abdomen or pelvis. Normal appendix. 5. Mildly enlarged bilateral inguinal lymph nodes, slightly increased in size compared to the previous CT. New area of skin thickening along the right inguinal crease. Correlate for cellulitis at this location. Electronically Signed   By: Davina Poke D.O.   On: 05/27/2022 14:36   CT ABDOMEN PELVIS W CONTRAST  Result Date: 05/27/2022 CLINICAL DATA:  Pulmonary embolism (PE) suspected, high prob; RLQ abdominal pain EXAM: CT ANGIOGRAPHY CHEST CT ABDOMEN AND PELVIS WITH CONTRAST TECHNIQUE: Multidetector CT imaging of the chest was performed using the standard protocol during bolus administration of intravenous contrast. Multiplanar CT image reconstructions and MIPs were obtained to evaluate the vascular anatomy. Multidetector CT imaging of the abdomen and pelvis was performed using the standard protocol during bolus administration of intravenous contrast. RADIATION DOSE REDUCTION: This exam was performed according to the departmental dose-optimization program which includes automated exposure control, adjustment of the mA and/or kV according to patient size  and/or use of iterative reconstruction technique. CONTRAST:  65mL OMNIPAQUE IOHEXOL 350 MG/ML SOLN COMPARISON:  07/02/2021 FINDINGS: CTA CHEST FINDINGS Cardiovascular: Satisfactory opacification of the pulmonary arteries to the segmental level. No evidence of acute pulmonary embolism. The previously seen bilateral lower lobe pulmonary emboli have resolved. Thoracic aorta is normal in course and caliber. Normal heart size. No pericardial effusion. Mediastinum/Nodes: Mildly enlarged bilateral axillary lymph nodes  measuring up to 1.1 cm in diameter on each side. No mediastinal or hilar lymphadenopathy. Thyroid gland, trachea, and esophagus demonstrate no significant findings. Lungs/Pleura: Lungs are clear. No pleural effusion or pneumothorax. Musculoskeletal: There is a new area abnormal skin thickening overlying the sternum with fluid in the subcutaneous soft tissues. This area measures approximately 7.0 x 1.7 x 4.0 cm (series 3, image 27; series 7, image 95). No soft tissue gas. No acute osseous abnormality. Review of the MIP images confirms the above findings. CT ABDOMEN and PELVIS FINDINGS Hepatobiliary: 9 mm low-density lesion within the inferior right hepatic lobe medially (series 5, image 32), too small to definitively characterize. This was faintly visible on the previous exam. Liver appears otherwise unremarkable. Partially contracted gallbladder. No hyperdense gallstone. No biliary dilatation. Pancreas: Unremarkable. No pancreatic ductal dilatation or surrounding inflammatory changes. Spleen: Normal in size without focal abnormality. Adrenals/Urinary Tract: Unremarkable adrenal glands. Kidneys enhance symmetrically without focal lesion, stone, or hydronephrosis. Ureters are nondilated. Urinary bladder appears unremarkable for the degree of distention. Stomach/Bowel: Stomach is within normal limits. Appendix appears normal (series 5, image 72). No evidence of bowel wall thickening, distention, or inflammatory  changes. Vascular/Lymphatic: No significant vascular abnormality. No pathologically enlarged intra-or pelvic lymph nodes. Enlarged bilateral inguinal lymph nodes measuring up to 1.8 cm on the left and 1.7 cm on the right. These have slightly increased in size compared to the previous exam. There is focal skin thickening along the right inguinal crease. Reproductive: Uterus and bilateral adnexa are unremarkable. Other: No free fluid. No abdominopelvic fluid collection. No pneumoperitoneum. No abdominal wall hernia. Musculoskeletal: No acute or significant osseous findings. Review of the MIP images confirms the above findings. IMPRESSION: 1. No evidence of acute pulmonary embolism. The previously seen bilateral lower lobe pulmonary emboli have resolved. 2. New area of abnormal skin thickening overlying the sternum with fluid in the subcutaneous soft tissues. This area measures approximately 7.0 x 1.7 x 4.0 cm. Appearance suspicious for cellulitis and superficial abscess. Correlate with physical exam. 3. Mildly enlarged bilateral axillary and inguinal lymph nodes, likely reactive. 4. No acute findings within the abdomen or pelvis. Normal appendix. 5. Mildly enlarged bilateral inguinal lymph nodes, slightly increased in size compared to the previous CT. New area of skin thickening along the right inguinal crease. Correlate for cellulitis at this location. Electronically Signed   By: Davina Poke D.O.   On: 05/27/2022 14:36   DG Chest Portable 1 View  Result Date: 05/27/2022 CLINICAL DATA:  CP EXAM: PORTABLE CHEST - 1 VIEW COMPARISON:  07/01/2021 FINDINGS: Cardiac silhouette is unremarkable. No pneumothorax or pleural effusion. The lungs are clear. The visualized skeletal structures are unremarkable. IMPRESSION: No acute cardiopulmonary process. Electronically Signed   By: Sammie Bench M.D.   On: 05/27/2022 12:51    Procedures Procedures    Medications Ordered in ED Medications  sodium chloride 0.9 %  bolus 1,000 mL (0 mLs Intravenous Stopped 05/27/22 1329)  ondansetron (ZOFRAN) injection 4 mg (4 mg Intravenous Given 05/27/22 1226)  iohexol (OMNIPAQUE) 350 MG/ML injection 75 mL (75 mLs Intravenous Contrast Given 05/27/22 1401)    ED Course/ Medical Decision Making/ A&P Clinical Course as of 05/27/22 1509  Sun May 27, 2022  1254 WBC(!): 14.1 [CA]  1254 Hemoglobin(!): 11.4 [CA]  1328 Lipase(!): 57 [CA]  1329 D-Dimer, Quant(!): 0.64 [CA]  1508 Pleuritic cp, n/v, abd pain Pmh PE no AC CTA neg Abscess on chest wall s/p ID 2 weeks ago, on chronic augmentin for HS  Wants to go to derm tomorrow for it Ct scan neg for pancreatitis []  [place] and po challenge and then DC [CO]    Clinical Course User Index [CA] Suzy Bouchard, PA-C [CO] Bradd Canary, MD                             Medical Decision Making Amount and/or Complexity of Data Reviewed External Data Reviewed: notes. Labs: ordered. Decision-making details documented in ED Course. Radiology: ordered and independent interpretation performed. Decision-making details documented in ED Course. ECG/medicine tests: ordered and independent interpretation performed. Decision-making details documented in ED Course.  Risk Prescription drug management.   This patient presents to the ED for concern of CP, SOB, abdominal pain, this involves an extensive number of treatment options, and is a complaint that carries with it a high risk of complications and morbidity.  The differential diagnosis includes ACS, PE, viral process, acute cholecystitis, appendicitis, etc  32 year old female presents to the ED due to chest pain, shortness of breath, nausea, vomiting, and abdominal pain that started yesterday.  She admits to a cough over the past few days.  Developed a fever yesterday.  Previous history of PE in 2023 which was thought to be related to sepsis, estrogen OCPs, and vaping.  Patient is not currently on any anticoagulants.  No longer on  estrogen OCPs. Upon arrival, patient afebrile, not tachycardic or hypoxic.  Patient in no acute distress.  Lungs clear to auscultation bilaterally.  Abdomen soft, nondistended with tenderness in the epigastric, right upper quadrant and right lower quadrant.  Abdominal labs ordered.  D-dimer to rule out PE.  RVP to rule out infection.  Once D-dimer has resulted we will obtain imaging, CT abdomen +/- CTA chest pending d-dimer results.  CBC significant for leukocytosis of 14.1 and anemia 11.4.  CMP with normal renal function.  Elevated total protein at 8.8.  Hyperglycemia 126.  No anion gap.  No major electrolyte derangements.  Lipase mildly elevated at 57.  D-dimer elevated 0.67.  CTA chest ordered given pleuritic chest pain and history of PE.  Added CT abdomen given right lower quadrant tenderness and upper abdominal tenderness to rule out evidence of pancreatitis versus appendicitis versus acute cholecystitis.  Pregnancy test negative.  Doubt ectopic pregnancy.  COVID/influenza test negative.  Troponin normal.  EKG demonstrates normal sinus rhythm.  No signs of acute ischemia.  Low suspicion for ACS.  Chest x-ray personally reviewed and interpreted which are negative for signs of pneumonia, pneumothorax or widened mediastinum.  CTA chest negative for PE.  Does demonstrate possible abscess overlying sternum.  And bilateral axillary and inguinal lymph nodes.  No evidence of appendicitis.  No gallbladder dilation or gallstones.  Low suspicion for acute cholecystitis.  Reassessed patient.  Patient has an area of induration and fluctuance to center of chest.  Patient notes she has a history of hidradenitis.  Had an I&D to abscess 2 weeks ago.  She notes she is on Augmentin chronically for at bedtime.  Offered I&D today in the ED however, patient notes she would prefer to follow-up with her dermatologist tomorrow for further evaluation who performed I&D 2 weeks ago.  No evidence of sepsis. She notes area is always  that large and has not grown in size.  Patient handed off to [facility] pending UA and po challenge. If UA is negative, patient may be discharged home with dermatology follow-up.   Has PCP Hx PE, Hydradenitis  Final Clinical Impression(s) / ED Diagnoses Final diagnoses:  Nonspecific chest pain  Hidradenitis    Rx / DC Orders ED Discharge Orders          Ordered    ondansetron (ZOFRAN-ODT) 4 MG disintegrating tablet  Every 8 hours PRN        05/27/22 1502              Karie Kirks 05/27/22 1512    Blanchie Dessert, MD 05/27/22 1539

## 2022-05-27 NOTE — ED Notes (Signed)
Pt ambulatory to bathroom

## 2022-05-27 NOTE — ED Provider Notes (Signed)
Patient initially presented with pleuritic chest pain, nausea, vomiting, abdominal pain.  She does have past medical history of PE but is not on anticoagulation.  So far workup is shown CTA is negative for PE.  However, she does have an abscess on the chest wall s/p I&D 2 weeks ago.  Patient prefers on having this drained at her dermatologist that she sees for a chest.  For further details of patient's initial workup, see previous providers note.   Physical Exam  BP 107/75   Pulse 84   Temp 97.9 F (36.6 C) (Oral)   Resp (!) 24   Ht 5\' 1"  (1.549 m)   Wt 88.5 kg   LMP  (LMP Unknown) Comment: Pt cannot recall last LMP and states periods are irregular  SpO2 100%   BMI 36.84 kg/m   ED Course / MDM   Clinical Course as of 05/27/22 1509  Sun May 27, 2022  1254 WBC(!): 14.1 [CA]  1254 Hemoglobin(!): 11.4 [CA]  1328 Lipase(!): 57 [CA]  1329 D-Dimer, Quant(!): 0.64 [CA]  1508 Pleuritic cp, n/v, abd pain Pmh PE no AC CTA neg Abscess on chest wall s/p ID 2 weeks ago, on chronic augmentin for HS Wants to go to derm tomorrow for it Ct scan neg for pancreatitis []  [place] and po challenge and then DC [CO]    Clinical Course User Index [CA] Suzy Bouchard, PA-C [CO] Bradd Canary, MD   Medical Decision Making Amount and/or Complexity of Data Reviewed Labs: ordered. Decision-making details documented in ED Course. Radiology: ordered.  Risk Prescription drug management.   Following up urine analysis, overall unremarkable.  Patient was cleared for discharge with instructions to see her dermatologist and possible to address the abscess on her chest wall.         Bradd Canary, MD 05/27/22 1555    Isla Pence, MD 05/27/22 [phone]

## 2022-05-27 NOTE — Discharge Instructions (Addendum)
It was a pleasure taking care of you today.  As discussed, your CT scan did not show a blood clot.  It did show an abscess likely related to your hidradenitis.  You were offered to drain the abscess today however, you prefer to follow-up with your dermatologist tomorrow.  Please call dermatology tomorrow for further evaluation of your abscess.  Keep taking your antibiotics as prescribed.  Your CT scans also showed enlarged lymph nodes.  Please follow-up with PCP within 1 week for further evaluation.

## 2022-05-28 ENCOUNTER — Emergency Department: Payer: BC Managed Care – PPO

## 2022-05-28 ENCOUNTER — Encounter: Payer: Self-pay | Admitting: Emergency Medicine

## 2022-05-28 ENCOUNTER — Emergency Department
Admission: EM | Admit: 2022-05-28 | Discharge: 2022-05-28 | Disposition: A | Discharge status: Home / Self Care | Payer: BC Managed Care – PPO | Attending: Emergency Medicine | Admitting: Emergency Medicine

## 2022-05-28 DIAGNOSIS — R531 Weakness: Secondary | ICD-10-CM

## 2022-05-28 DIAGNOSIS — R000 Tachycardia, unspecified: Secondary | ICD-10-CM | POA: Diagnosis not present

## 2022-05-28 DIAGNOSIS — J45909 Unspecified asthma, uncomplicated: Secondary | ICD-10-CM | POA: Insufficient documentation

## 2022-05-28 DIAGNOSIS — R1031 Right lower quadrant pain: Secondary | ICD-10-CM

## 2022-05-28 DIAGNOSIS — L732 Hidradenitis suppurativa: Secondary | ICD-10-CM | POA: Diagnosis not present

## 2022-05-28 DIAGNOSIS — R0602 Shortness of breath: Secondary | ICD-10-CM | POA: Diagnosis not present

## 2022-05-28 DIAGNOSIS — R079 Chest pain, unspecified: Secondary | ICD-10-CM | POA: Diagnosis not present

## 2022-05-28 LAB — CBC
HCT: 39 % (ref 36.0–46.0)
Hemoglobin: 12 g/dL (ref 12.0–15.0)
MCH: 27.7 pg (ref 26.0–34.0)
MCHC: 30.8 g/dL (ref 30.0–36.0)
MCV: 90.1 fL (ref 80.0–100.0)
Platelets: 390 10*3/uL (ref 150–400)
RBC: 4.33 MIL/uL (ref 3.87–5.11)
RDW: 14.3 % (ref 11.5–15.5)
WBC: 13.3 10*3/uL — ABNORMAL HIGH (ref 4.0–10.5)
nRBC: 0 % (ref 0.0–0.2)

## 2022-05-28 LAB — BASIC METABOLIC PANEL
Anion gap: 5 (ref 5–15)
BUN: 7 mg/dL (ref 6–20)
CO2: 27 mmol/L (ref 22–32)
Calcium: 8.3 mg/dL — ABNORMAL LOW (ref 8.9–10.3)
Chloride: 99 mmol/L (ref 98–111)
Creatinine, Ser: 0.69 mg/dL (ref 0.44–1.00)
GFR, Estimated: >60 mL/min (ref >60)
Glucose, Bld: 109 mg/dL — ABNORMAL HIGH (ref 70–99)
Potassium: 3.1 mmol/L — ABNORMAL LOW (ref 3.5–5.1)
Sodium: 131 mmol/L — ABNORMAL LOW (ref 135–145)

## 2022-05-28 LAB — TROPONIN I (HIGH SENSITIVITY): Troponin I (High Sensitivity): 4 ng/L (ref <18)

## 2022-05-28 LAB — LIPASE, BLOOD: Lipase: 45 U/L (ref 11–51)

## 2022-05-28 MED ORDER — DOXYCYCLINE MONOHYDRATE 100 MG PO TABS: 100.0000 mg | ORAL_TABLET | Freq: Two times a day (BID) | ORAL | 0 refills | Status: AC

## 2022-05-28 MED ORDER — LACTATED RINGERS IV BOLUS
1000.0000 mL | Freq: Once | INTRAVENOUS | Status: AC
  Administered 2022-05-28: 1000 mL via INTRAVENOUS

## 2022-05-28 MED ORDER — ONDANSETRON HCL 4 MG/2ML IJ SOLN
4.0000 mg | Freq: Once | INTRAMUSCULAR | Status: AC
  Administered 2022-05-28: 4 mg via INTRAVENOUS
  Filled 2022-05-28: qty 2

## 2022-05-28 MED ORDER — HYDROMORPHONE HCL 1 MG/ML IJ SOLN
1.0000 mg | Freq: Once | INTRAMUSCULAR | Status: AC
  Administered 2022-05-28: 1 mg via INTRAVENOUS
  Filled 2022-05-28: qty 1

## 2022-05-28 MED ORDER — POTASSIUM CHLORIDE CRYS ER 20 MEQ PO TBCR
40.0000 meq | EXTENDED_RELEASE_TABLET | Freq: Once | ORAL | Status: AC
  Administered 2022-05-28: 40 meq via ORAL
  Filled 2022-05-28: qty 2

## 2022-05-28 NOTE — ED Notes (Addendum)
Pt ambulated to the room with EDT and attached to the monitor; primary RN made aware of the patients arrival.

## 2022-05-28 NOTE — ED Triage Notes (Signed)
Pt presents ambulatory to triage via POV with complaints of weakness with associated CP & abdominal pain and SOB with exertion. No meds take PTA.  Pt had COVID test, Scans, and labs yesterday which were unremarkable. A&Ox4 at this time. Denies cough, fevers, N/V/D.

## 2022-05-28 NOTE — Discharge Instructions (Addendum)
Your blood work EKG and chest x-ray are all reassuring.  Please start taking the doxycycline twice a day for the next month.  Please follow-up with your dermatologist.  If you continue to feel overly fatigued please follow-up with your primary doctor.

## 2022-05-28 NOTE — ED Provider Notes (Signed)
[facility] Provider Note    Event Date/Time   First MD Initiated Contact with Patient 05/28/22 1948     (approximate)   History   Weakness   HPI  Courtney [name] is a 32 y.o. female past medical history of at bedtime, bipolar disorder, ADHD, PE not on anticoagulation who presents with abdominal pain and chest pain feeling generally well.  Patient tells me she feels like she is been in a daze for several days.  She has had sharp chest pain as well as right lower quadrant abdominal pain during this time.  Did have vomiting today and ongoing nausea no diarrhea has had normal bowel movements denies urinary symptoms.  Has had several days of cough.  Patient was seen in [facility] ED yesterday CT abdomen pelvis with contrast was negative for any acute intra-abdominal pathology.  Did demonstrate an abscess over her sternum as well as enlarged axillary and inguinal lymph nodes likely reactive from the chest.  Patient was offered I&D of her sternal abscess but declined stating she would prefer her dermatologist do this.  Patient also has CT angio of her chest after having a positive D-dimer this is negative for PE.  Patient says she felt improved after leaving the ER yesterday but then today her symptoms were worse.  Takes hydrocodone and tramadol chronically for pain.  She is going to see a pain specialist.     Past Medical History:  Diagnosis Date   ADHD (attention deficit hyperactivity disorder)    Bipolar disorder ([place])    Chronic tonsillitis 08/2013   current strep, will finish antibiotic 09/04/2013; snores during sleep, mother denies apnea   Family history of adverse reaction to anesthesia    " My cousin had problems waking up. "   Heart murmur    "when I was born; outgrew it; I was a preemie"   Hidradenitis    Hidradenitis 2017   Migraine 12/19/2016   "I've just had this one for 3 days" (12/20/2016)   Septic shock ([place]) 07/02/2021    Patient Active Problem List    Diagnosis Date Noted   Otitis externa of both ears 05/16/2022   Chronic rhinitis 02/15/2022   Shellfish allergy 02/15/2022   Pulmonary embolism ([place]) 07/19/2021   Hypotension 07/11/2021   Trichomonas vaginitis 99991111   Acute metabolic encephalopathy 99991111   Bilateral pulmonary embolism ([place]) 07/05/2021   Anxiety disorder 06/28/2021   Prediabetes 05/25/2021   Allergy to adhesive tape 05/25/2021   Hidradenitis suppurativa 05/23/2021   Insomnia 03/27/2021   Anemia 12/21/2020   Hypoglycemia 11/17/2020   Bipolar disorder, in full remission, most recent episode mixed ([place]) 10/07/2020   Bipolar disorder, in full remission, most recent episode depressed ([place]) 03/03/2020   Bipolar 2 disorder, major depressive episode ([place]) 08/04/2019   At risk for long QT syndrome 08/04/2019   Obesity (BMI 35.0-39.9 without comorbidity) 03/19/2019   Reactive airway disease 02/11/2019   Conductive hearing loss 03/13/2018   Axillary hidradenitis suppurativa 04/03/2017   Vitamin D deficiency 10/18/2014   Attention deficit hyperactivity disorder (ADHD), predominantly hyperactive type 02/11/2012     Physical Exam  Triage Vital Signs: ED Triage Vitals  Enc Vitals Group     BP 05/28/22 1939 104/81     Pulse Rate 05/28/22 1939 (!) 106     Resp 05/28/22 1939 20     Temp 05/28/22 1939 98.4 F (36.9 C)     Temp Source 05/28/22 1939 Oral     SpO2  05/28/22 1939 100 %     Weight 05/28/22 1936 195 lb (88.5 kg)     Height 05/28/22 1936 5\' 1"  (1.549 m)     Head Circumference --      Peak Flow --      Pain Score 05/28/22 1935 9     Pain Loc --      Pain Edu? --      Excl. in [place]? --     Most recent vital signs: Vitals:   05/28/22 1945 05/28/22 2200  BP: 106/72 108/74  Pulse: (!) 103 88  Resp: 16 16  Temp:    SpO2: 100% 100%     General: Awake, no distress.  CV:  Good peripheral perfusion.  Resp:  Normal effort.  Abd:  No distention.  Neuro:             Awake, Alert, Oriented x 3   Other:  Sequela of severe at bedtime in the bilateral axilla and inguinal region and vulva, no particularly fluctuant areas that would need drainage  There is tenderness and swelling over the period part of the sternum, no overlying skin change   ED Results / Procedures / Treatments  Labs (all labs ordered are listed, but only abnormal results are displayed) Labs Reviewed  BASIC METABOLIC PANEL - Abnormal; Notable for the following components:      Result Value   Sodium 131 (*)    Potassium 3.1 (*)    Glucose, Bld 109 (*)    Calcium 8.3 (*)    All other components within normal limits  CBC - Abnormal; Notable for the following components:   WBC 13.3 (*)    All other components within normal limits  LIPASE, BLOOD  POC URINE PREG, ED  TROPONIN I (HIGH SENSITIVITY)     EKG  EKG reviewed interpreted myself shows sinus tachycardia normal axis normal intervals low voltage no acute ischemic changes   RADIOLOGY I reviewed and interpreted the CXR which does not show any acute cardiopulmonary process    PROCEDURES:  Critical Care performed: No  .1-3 Lead EKG Interpretation  Performed by: Courtney Hay, MD Authorized by: Courtney Hay, MD     Interpretation: normal     ECG rate assessment: normal     Rhythm: sinus rhythm     Ectopy: none     Conduction: normal     The patient is on the cardiac monitor to evaluate for evidence of arrhythmia and/or significant heart rate changes.   MEDICATIONS ORDERED IN ED: Medications  lactated ringers bolus 1,000 mL (0 mLs Intravenous Stopped 05/28/22 2202)  HYDROmorphone (DILAUDID) injection 1 mg (1 mg Intravenous Given 05/28/22 2049)  ondansetron (ZOFRAN) injection 4 mg (4 mg Intravenous Given 05/28/22 2048)  potassium chloride SA (KLOR-CON M) CR tablet 40 mEq (40 mEq Oral Given 05/28/22 2200)     IMPRESSION / MDM / ASSESSMENT AND PLAN / ED COURSE  I reviewed the triage vital signs and the nursing notes.                               Patient's presentation is most consistent with acute complicated illness / injury requiring diagnostic workup.  Differential diagnosis includes, but is not limited to, viral illness, dehydration, electrolyte abnormality, medication side effect, less likely appendicitis, ovarian torsion, less likely PE, pneumonia, ACS  The patient is a 32 year old female presents with several complaints.  She feels like if  she is in a fog and generally weak also complains of chest pain and right lower quadrant abdominal pain.  The symptoms are going on for several days.  Her primary complaint currently is acute abdominal pain.  Has had some vomiting today no urinary symptoms or discharge no fevers or chills.  Patient at [facility] ED yesterday and had a fairly workup including negative CT angio for PE CT of the abdomen pelvis that was negative for appendicitis or other acute abdominal process including normal-appearing adnexa.  Labs urinalysis EKG all reassuring.  Did note on CT likely superficial abscess on the sternum related to hidradenitis which patient did not want I&D of in the ED.  Also inguinal and axillary reactive adenopathy related to the at bedtime.  She tells me she felt better when she left the ED but then pain and her generalized malaise returned.  She is concerned because last time she fell like she was in a fog like this she had sepsis.  Patient's vitals are reassuring.  She looks nontoxic.  She has mild tenderness in the right lower quadrant but overall benign exam.  Patient does have sequela of severe at bedtime on her chest wall axilla and inguinal region.  She has an area of fluctuance over the line of the sternum which is visualized on CT but patient prefers not to have I&D.  Was previously taking Augmentin but stopped yesterday because she thought her symptoms of shortness of breath and fatigue could be an allergic reaction.  Has tolerated doxycycline in the past.  In terms of her chest pain  abdominal pain reassured by negative workup yesterday.  Feel that utility of repeating imaging or obtaining ultrasound of the pelvis will be low given normal adnexa normal appendix.  I did get cardiac enzymes basic labs and chest x-ray given ongoing chest pain knees are reassuring.  Suspect the patient could have a viral syndrome causing her symptoms of vomiting pain and malaise.  Patient was given bolus of fluid Dilaudid and Zofran after which she felt improved and was tolerating p.o.  Will prescribe doxycycline for at bedtime.  Encouraged dermatology follow-up.     FINAL CLINICAL IMPRESSION(S) / ED DIAGNOSES   Final diagnoses:  RLQ abdominal pain  Weakness  Hydradenitis     Rx / DC Orders   ED Discharge Orders          Ordered    doxycycline (ADOXA) 100 MG tablet  2 times daily        05/28/22 2159             Note:  This document was prepared using Dragon voice recognition software and may include unintentional dictation errors.   Courtney Hay, MD 05/28/22 2203

## 2022-05-29 ENCOUNTER — Other Ambulatory Visit: Payer: Self-pay | Admitting: Physician Assistant

## 2022-05-29 DIAGNOSIS — G4459 Other complicated headache syndrome: Secondary | ICD-10-CM

## 2022-05-29 DIAGNOSIS — Z86718 Personal history of other venous thrombosis and embolism: Secondary | ICD-10-CM

## 2022-05-29 DIAGNOSIS — G379 Demyelinating disease of central nervous system, unspecified: Secondary | ICD-10-CM

## 2022-05-30 NOTE — Unmapped (Signed)
[facility] Refill Coordination Note    Melissa Pearson, Mio: 1990/04/17  Phone: [phone] (home) [phone] (work)      All above HIPAA information was verified with patient.         05/29/2022     3:56 PM   Specialty Rx Medication Refill Questionnaire   Which Medications would you like refilled and shipped? Cosentyx   Please list all current allergies: Morphine   Have you missed any doses in the last 30 days? No   Have you had any changes to your medication(s) since your last refill? No   How many days remaining of each medication do you have at home? 0   If receiving an injectable medication, next injection date is 05/31/2022   Have you experienced any side effects in the last 30 days? No   Please enter the full address (street address, city, state, zip code) where you would like your medication(s) to be delivered to. [address] [place] [address]   Please specify on which day you would like your medication(s) to arrive. Note: if you need your medication(s) within 3 days, please call the pharmacy to schedule your order at [phone]  06/05/2022   Has your insurance changed since your last refill? No   Would you like a pharmacist to call you to discuss your medication(s)? No   Do you require a signature for your package? (Note: if we are billing Medicare Part B or your order contains a controlled substance, we will require a signature) No         Completed refill call assessment today to schedule patient's medication shipment from the [facility] [phone]).  All relevant notes have been reviewed.       Confirmed patient received a Conservation officer, historic buildings and a Surveyor, mining with first shipment. The patient will receive a drug information handout for each medication shipped and additional FDA Medication Guides as required.         REFERRAL TO PHARMACIST     Referral to the pharmacist: Not needed      [facility]     Shipping address confirmed in Epic. Delivery Scheduled: Yes, Expected medication delivery date: 3/29. Confirmed date with call to Patient*    Medication will be delivered via UPS to the prescription address in Epic WAM.    [name]   [facility] Specialty Technician

## 2022-05-31 MED FILL — COSENTYX PEN 300 MG/2 PENS (150 MG/ML) SUBCUTANEOUS: SUBCUTANEOUS | 28 days supply | Qty: 2 | Fill #6

## 2022-06-06 DIAGNOSIS — L732 Hidradenitis suppurativa: Principal | ICD-10-CM

## 2022-06-06 NOTE — Unmapped (Signed)
Date Of Visit: 06/07/2022     CHIEF COMPLAINT: multiple body pain area including groin, bilateral axilla, under breasts, buttocks pain     PAIN DESCRIPTION: This is a 32 y.o. year old female with pain in the aforementioned areas here for a new patient consultation. The pain is currently rated as a 10/10. It has gone on for >1 year and is described as occurring constant with intermittent episodes of worsening pain. Overall, it has increased since the pain initially began. It is described as throbbing, shooting, sharp, hot/burning, and aching. Factors that make the pain better include: medications. Factors that make the pain worse include: heat, cold, lying down, sitting, standing, walking, fatigue, and coughing. Activities affected by the pain include falling asleep, staying asleep, sexual activity, and work / school.      ----  Referring Provider: Kerby Nora Sayed,MD  PCP: Dayton Bailiff, MD  Date of Initial consult: 06/07/2022  PSYCH: [facility]     PAIN HISTORY: ( SB /   ) Very pleasant patient referred for evaluation of chronic pain associated with wounds from Stage 3 hidradentitis suppurativa. Has wounds throughout her axilla, under breasts, groins and buttocks. Follows with dermatology who manage her cosentyx and remicade infusions. Works full time as an Museum/gallery exhibitions officer and tries to not let the pain limit her. She follows with psychiatry for her Bipolar and A&D. She has had a few small rxs for norco 5mg  to use sparingly for pain and ideally hopes to use as last resort as she has had friends who have overdosed and concerns for that.     SOC Hx: single, lives with mother and grandmother, works full time as EMT, denies etoh or drug use , has friends that have had hx of substance abuse and overdose on opioids so hopes to avoid as much as possible   PMHx: Bipolar, Hidradenitis Suprrativa, hx of PE due to [facility] but has come off eliquis  CPS Plan: UDS (can consider Norco or percocet 5mg  #20 a month angry)      The following measures were taken to ensure proper prescribing of opioid medications:  - Opioid agreement was signed on:  {Pain Contract:72558}     - Baseline PEG (06/06/2022) IF off opioids: ***    - Last Psych screening test done:       Result:     - [place] reviewed and any notable findings:   06/06/2022 -     - Last urine drug screen:     - NCO Database accessed:     - Current MME if prescribed opioids:     - H/O Aberrancies:         NO SHOWS AND CANCELLATIONS: None on file  ----  ALLERGIES:   Allergies   Allergen Reactions    Adhesive Tape-Silicones Hives     Pt can only tolerate cloth tape    Ginger Anaphylaxis    Other Hives     ABD pad causes rash  Pt can only tolerate cloth tape  Pt can only tolerate cloth tape    Amoxicillin-Pot Clavulanate Headache    Opioids - Morphine Analogues       I become very angry    Iodine Nausea Only    Vancomycin Other (See Comments)     Burns her vein really bad and always causes them to blow.     MEDICATIONS:   Current Outpatient Medications on File Prior to Visit   Medication Sig Dispense  Refill    albuterol HFA 90 mcg/actuation inhaler ProAir HFA 90 mcg/actuation aerosol inhaler      amoxicillin-clavulanate (AUGMENTIN) 875-125 mg per tablet Take 1 tablet by mouth two (2) times a day. 28 tablet 2    apixaban (ELIQUIS) 5 mg Tab Take 1 tablet (5 mg total) by mouth every twelve (12) hours.      ARIPiprazole (ABILIFY) 2 MG tablet Take 1 tablet (2 mg total) by mouth.      azelastine (ASTELIN) 137 mcg (0.1 %) nasal spray PLACE 1 SPRAY INTO BOTH NOSTRILS 2 (TWO) TIMES DAILY. USE IN EACH NOSTRIL AS DIRECTED      Bifidobacterium infantis (ALIGN ORAL) Take by mouth.      bimekizumab-bkzx 160 mg/mL AtIn Inject the contents of 2 syringes (320 mg total)  every 8 weeks. 4 mL 2    bimekizumab-bkzx 160 mg/mL AtIn Inject the contents of 2 syringes (320 mg total)  under the skin every twenty-eight (28) days. 4 mL 0    busPIRone (BUSPAR) 10 MG tablet Take 1 tablet and does not qualify for this measure      134 Preventive Care & Screening for Depression & F/U Plan  % pts  > 73 yo using depression screening tool (PRIME MD-PHQ-2) and if positive, a follow up plan is documented on the date or 2 days after the date.  - Patient has been diagnosed with bipolar disorder. Not eligible for this measure. [Z6109]   178 Rheumatoid Arthritis: Functional Status Assessment  % of patients aged  > 52 yo with a dx of RA for whom a functional status assessment was performed at least once within 12 months.  - Patient does NOT have a diagnosis of Rheumatoid arthritis and does not qualify for this measure.   226 Tobacco Use: Screening & Cessation Intervention  Patient use any type of tobacco?  - Patient screened for tobacco use & identified as a tobacco NON-USER. [Performed met (G9903)]   318 Falls Risk  % of patients  > 38 yo who were screened for future fall risk during the measurement period  - Patient is < 75 yo on date of encounter and does NOT qualify for this measure    155 Falls:   Plan of Care % of patients  > 65 yo with 2 or more falls per yr with a falls plan of care documented  - Patient is < 24 years old on date of encounter and does not qualify for this measure      NO SHOWS AND CANCELLATIONS: None on file  ----  ALLERGIES:   Allergies   Allergen Reactions    Adhesive Tape-Silicones Hives     Pt can only tolerate cloth tape    Ginger Anaphylaxis    Other Hives     ABD pad causes rash  Pt can only tolerate cloth tape  Pt can only tolerate cloth tape    Amoxicillin-Pot Clavulanate Headache    Opioids - Morphine Analogues       I become very angry    Iodine Nausea Only    Vancomycin Other (See Comments)     Burns her vein really bad and always causes them to blow.     MEDICATIONS:   Current Outpatient Medications on File Prior to Visit   Medication Sig Dispense Refill    doxycycline (ADOXA) 100 MG tablet Take 1 tablet (100 mg total) by mouth two (2) times a day.      albuterol HFA 90 (MAXALT)  10 MG tablet PLEASE SEE ATTACHED FOR DETAILED DIRECTIONS      secukinumab (COSENTYX PEN, 2 PENS,) 150 mg/mL PnIj injection Inject the contents of 2 pens (300 mg total) once weekly at weeks 0, 1, 2, 3, and 4. As loading dose. 10 mL 0    secukinumab (COSENTYX PEN, 2 PENS,) 150 mg/mL PnIj injection Inject the contents of 2 pens (300 mg total) under the skin every twenty-eight (28) days. Maintenance dose. 2 mL 11    sulfamethoxazole-trimethoprim (BACTRIM DS) 800-160 mg per tablet TAKE 1 TABLET (160 MG OF TRIMETHROPRIM TOTAL) BY MOUTH TWICE A DAY 60 tablet 3    traMADoL (ULTRAM) 50 mg tablet Take 1 tablet (50 mg total) by mouth every six (6) hours as needed for pain. 15 tablet 0    triamcinolone (KENALOG) 0.1 % ointment Apply topically Two (2) times a day. 454 g 5    venlafaxine (EFFEXOR) 37.5 MG tablet Take 1 tablet (37.5 mg total) by mouth two (2) times a day.       Current Facility-Administered Medications on File Prior to Visit   Medication Dose Route Frequency Provider Last Rate Last Admin    ertapenem Pincus Sanes) injection 1 g  1 g Intravenous Once Elsie Stain, MD        povidone-iodine 10 % swab 1 Application  1 application. Each Nare Once Harvin Hazel, MD         PAST MEDICAL HISTORY:   Past Medical History:   Diagnosis Date    Acne     ADHD     Allergic     Bipolar 1 disorder (CMS-HCC)     Disorder of skin or subcutaneous tissue 2018    HS    Flu     had tamiflu    Hydradenitis     Keloid     Right ear     PAST SURGICAL HISTORY:   Past Surgical History:   Procedure Laterality Date    INNER EAR SURGERY Bilateral     reconstruction of ear drum as child    PR EXCISION HIDRADENITIS INGUINAL COMPLEX REPAIR Bilateral 03/09/2022    Procedure: EXCISION OF GROIN SWEAT GLANDS;  Surgeon: Harvin Hazel, MD;  Location: [facility];  Service: General Surgery    SKIN BIOPSY      TONSILLECTOMY       FAMILY MEDICAL HISTORY:   Family History   Problem Relation Age of Onset    Allergy (severe) Mother     Diabetes Mother     Stroke Maternal Grandmother         Bell???s palsy    Melanoma Neg Hx     Basal cell carcinoma Neg Hx     Squamous cell carcinoma Neg Hx      SOCIAL HISTORY:   Social History     Socioeconomic History    Marital status: Single   Tobacco Use    Smoking status: Former     Types: e-Cigarettes    Smokeless tobacco: Never    Tobacco comments:     Hx of vape use   Vaping Use    Vaping status: Former   Substance and Sexual Activity    Alcohol use: Yes     Comment: 1 per month or less    Drug use: Never    Sexual activity: Not Currently     Partners: Male     Birth control/protection: Condom, Birth Control Pills   Other Topics Concern  Do you use sunscreen? Yes    Tanning bed use? Yes    Are you easily burned? Yes    Excessive sun exposure? Yes    Blistering sunburns? Yes     Social Determinants of Health     Financial Resource Strain: Low Risk  (02/11/2019)    Received from [facility]    Overall Financial Resource Strain (CARDIA)     Difficulty of Paying Living Expenses: Not hard at all    Received from [facility]    Social Network     REVIEW OF SYSTEMS:  Constitutional: {ROSGEN:73216}  Musculoskeletal:{ROSMSK:73220}  Neurological: {ROS Neurology:27160}   Psychiatric:{ROS Psychiatry:27161}  Cardiovascular: {ROS Cardiovascular:27162}  Pulmonary: {ROS Respiratory:27163}  GI: {ROS Gastrointestinal:27164}  GU: {ROS Genitourinary:27165}  Integumentary: {ROS Integumentary:27166}  Hematologic/Lymphatic: {ROS Hematology:27168}  Head/Ears/Eyes/Nose/Throat: {ROS ZOXWR:60454}  Rheumatology/Endo: {ROSRHEUM:73232}  --Any additional pertinent positives and negatives are noted in HPI above    PHYSICAL EXAM:  VITALS:   There were no vitals filed for this visit.  GENERAL: Appears stated age. No overt pain behaviors. Well-developed, well-nourished, in no acute distress   MENTAL STATUS:  Alert, oriented x 3 with congruent mood and normal affect. Thought process rational, linear and goal direct  HEENT: Reveals Normocephalic, atraumatic scalp. Extraocular movement appear full and intact.   CARDIOVASCULAR: Regular S1, S2. Peripheral pulses palpable at the radial artery. Appears well perfused, no lower extremity edema bilaterally. No cyanosis or clubbing noted.   PULMONARY: Respirations unlabored without evidence of stridor, wheezing. Good air exchange. Bilateral chest excursions. Speaking in complete sentences.   ABDOMEN: Convex abdomen, soft, non-tender, non-distended. No masses palpable.  MUSCULOSKELETAL: The motor exam reveals the RIGHT lower extremity to be 5/5 with hip flexion, 5/5 knee flexion and extention, 5/5 at the dorsi and plantar flexion of the ankle. The motor exam reveals the LEFT lower extremity to be 5/5 with hip flexion, 5/5 knee flexion and extension, 5/5 with dorsi and plantar flexion of the ankle. {Exam; extremity joint:17206}. {Gait:73235::Smooth, heel-toe, non-antalgic gait,Not using any assistive devices for ambulation}.   NEURO: CN II-XII grossly intact. Recent and remote history are intact. Speech is fluent. The sensory exam revealed {no/mild/moderate/severe:19664} deficits to light touch in the {extremities:50937::bilateral upper and lower extremities}. {NeuroExam:75778::Motor and sensory grossly normal bilaterally,Normal muscle bulk and tone without spasticity,No focal neurological deficits noted}  SPINE: {SPINEEXAM:31965::Spine straight w/o obvious deformities. ,No scoliosis noted on back exam. } Palpation of the {SD DESC; VERTEBRAL SPINE:38038} musculature {Desc; did/not:14019:s} elicit pain. There was {normal/increased/decreased:30423414:s} flexion and extension at the waist that {Desc; did/not:14019:s} cause pain. {NEGATIVE / POSITIVE /:115000003:s} pain on facet loading procedures.   Straight leg raise was {POSITIVE NEGATIVE:25405:s} {Desc; on the right/left/midline/bilat:19137}.  Patrick's maneuver was {pgi positive/negative:4357879583:s} {Desc; on the right/left/midline/bilat:19137}.   Palpation over the SI Joints was {pgi positive/negative:4357879583:s} {Desc; on the right/left/midline/bilat:19137}.   Palpation over the GTB was {pgi positive/negative:4357879583:s} {Desc; on the right/left/midline/bilat:19137}.     JOINTS: With maneuvers of the RIGHT hip there {was/was not:19892} pain. With manuvers of the LEFT hip there {was/was not:19892} pain. Crepitus {was/was not:19892} felt in {bilateral, left, right :312786} knee. {Exam; extremity joint:17206}  INTEGUMENTARY: Normal coloration, turgor and no rashes, suspicious skin lesions noted. No evidence of edema, rashes, or infection    ASSESSMENT:   No diagnosis found.    PLAN:   At this visit, the following medications were prescribed with appropriate education:  Requested Prescriptions      No prescriptions requested or ordered in this encounter  Additional orders given at today's visit:  No orders of the defined types were placed in this encounter.      -       - I have personally reviewed and updated the pain description, ROS, Physical Exam, and plan today.  - I reviewed and summarized old records regarding the patient's case including from referral notes and Epic EMR & Care Everywhere.  - I personally reviewed the [place] today and there is no evidence the patient is receiving controlled substances in an inappropriate manner.  - Personally reviewed the reports from the patient's *** radiographic study, explained the findings, the clinical significance and implications to the patient today in clinic.    {ZOX0960 AVWU:98119::-         I have personally reviewed and updated the pain description, ROS, Physical Exam, and plan today.}    FOLLOW UP:  No follow-ups on file.    This note was generated in part with voice recognition software and I apologize for any typographical errors that were not detected and corrected.    Future Appointments   Date Time Provider Department Center   06/07/2022 1:00 PM Evelina Bucy, NP [facility]   06/11/2022 10:45 AM UNCTIF RN 5 [facility]   06/12/2022  2:30 PM Reva Bores, PA [facility]   07/09/2022 11:30 AM UNCTIF RN 6 [facility]   08/06/2022 11:30 AM UNCTIF 19 [facility]   09/03/2022 11:30 AM UNCTIF RN 6 [facility]   10/01/2022 10:45 AM UNCTIF 21 [facility]   10/29/2022 10:45 AM UNCTIF 11 [facility]   11/26/2022 10:45 AM UNCTIF 10 [facility]       Raynelle Fanning, NP   Electronically signed 06/06/2022  11:30 AM defined types were placed in this encounter.      -Patient seen for new consult of chronic pain in multiple areas secondary to wounds from stage III hidradenitis suppurativa.  She unfortunately has frequent flareups despite the ongoing treatment.  Discussed multimodal approach to managing her pain.  Most of her treatment will be geared towards medication therapy as there is no interventional therapy to necessarily offer.    -Discussed trying to optimize nonopioid related medications and patient is in agreement with this as she would like to try to avoid opioids and use them just very sparingly as possible as she does have some friends that have had overdoses and she also works full-time as an Museum/gallery exhibitions officer and needs to be able to function.  The patient states they do not currently have access to a prescribing provider; therefore, she will be initiated in our opioid screening process.   - A baseline Urine drug screen ordered today as part of routine opioid monitoring and to evaluate presence of prescribed/non-prescribed medications or any illicit drugs - should expect ritalin, norco and gabapentin   - If need in the future, may consider Norco or percocet #15-20 to last a month     -Begin a trial of gabapentin 300 to 600 mg 3 times daily.  Titration schedule was reviewed in detail.  Can always consider increasing in the future.    -Begin a trial of meloxicam 7.5 mg 1-2 times a day as needed.    -She will continue to follow-up with psychiatry in regards to her psychiatric medications.      - I have personally reviewed and updated the pain description, ROS, Physical Exam, and plan today.  -  I reviewed and summarized old records regarding the patient's case including from referral notes and Epic EMR & Care Everywhere.  - I personally reviewed [place] today and there is no evidence the patient is receiving controlled substances in an inappropriate manner.  -         I wrote prescriptions for the medications listed above and informed the patient about potential side effects.  -    2024 MIPS was completed today during the visit    FOLLOW UP:  Return for 6-8 weeks with SB .    This note was generated in part with voice recognition software and I apologize for any typographical errors that were not detected and corrected.    Future Appointments   Date Time Provider Department Center   06/11/2022 10:45 AM UNCTIF RN 5 [facility]   07/05/2022 10:30 AM Reva Bores, PA [facility]   07/09/2022 11:30 AM UNCTIF RN 6 [facility]   07/19/2022  9:20 AM Evelina Bucy, NP [facility]   08/06/2022 11:30 AM UNCTIF 19 [facility]   09/03/2022 11:30 AM UNCTIF RN 6 [facility]   10/01/2022 10:45 AM UNCTIF 21 [facility]   10/29/2022 10:45 AM UNCT[facility] 11 [facility]   11/26/2022 10:45 AM UNCT[facility] 10 [facility]       Raynelle Fanning, NP   Electronically signed 06/07/2022  1:54 PM

## 2022-06-07 ENCOUNTER — Other Ambulatory Visit: Payer: Self-pay | Admitting: Nurse Practitioner

## 2022-06-07 ENCOUNTER — Ambulatory Visit
Admit: 2022-06-07 | Discharge: 2022-06-08 | Payer: PRIVATE HEALTH INSURANCE | Attending: Nurse Practitioner | Primary: Nurse Practitioner

## 2022-06-07 DIAGNOSIS — M792 Neuralgia and neuritis, unspecified: Principal | ICD-10-CM

## 2022-06-07 DIAGNOSIS — G8929 Other chronic pain: Principal | ICD-10-CM

## 2022-06-07 DIAGNOSIS — Z5181 Encounter for therapeutic drug level monitoring: Principal | ICD-10-CM

## 2022-06-07 DIAGNOSIS — L732 Hidradenitis suppurativa: Principal | ICD-10-CM

## 2022-06-07 DIAGNOSIS — Z79891 Long term (current) use of opiate analgesic: Principal | ICD-10-CM

## 2022-06-07 DIAGNOSIS — R52 Pain, unspecified: Principal | ICD-10-CM

## 2022-06-07 DIAGNOSIS — T148XXA Other injury of unspecified body region, initial encounter: Principal | ICD-10-CM

## 2022-06-07 DIAGNOSIS — J4520 Mild intermittent asthma, uncomplicated: Secondary | ICD-10-CM

## 2022-06-07 MED ORDER — MELOXICAM 7.5 MG TABLET
ORAL_TABLET | Freq: Two times a day (BID) | ORAL | 5 refills | 30 days | Status: CP | PRN
Start: 2022-06-07 — End: ?

## 2022-06-07 MED ORDER — GABAPENTIN 300 MG CAPSULE
ORAL_CAPSULE | Freq: Three times a day (TID) | ORAL | 2 refills | 30 days | Status: CP
Start: 2022-06-07 — End: 2022-09-05

## 2022-06-07 NOTE — Telephone Encounter (Signed)
Called pt and she stated that she does need the medication.

## 2022-06-07 NOTE — Unmapped (Addendum)
1.) Try starting meloxicam 7.5mg  1-2 x a day as needed. Take with some food just to be easier on your stomach.   Don't take any additional ibuprofen (advil) or naproxen (aleve)     2.) You can also take Tylenol Extra Strength (500mg  tablets). You can take two of these at a time (1,000mg  total) up to three times a day. (MAX 6 tablets in a day). These are more immediate acting tablets.   If you would prefer, you could try Tylenol Arthritis (650mg  tablets) and these are more of the long acting and slow release version that last about 6-8 hours. You can take 1 tablet up to 4 times a day.   Be careful not to exceed 3,000 mg total of Tylenol (acetaminophen) in your 24 hour dose whether you use the short acting or you use the long acting tablets.   You can get the generic version of either of these at a local pharmacy.     3.) We will try starting some gabapentin - follow the titration schedule below    GABAPENTIN (NEURONTIN) INITIAL DOSING   Days Morning Afternoon Bedtime   1-3 0 0 1 pill   4-6 1 pill 0 1 pill   7 & 10 1 pill 1 pill 1 pill   11-14 1 pill 1 pill 2 pills   15 + 2 pill  2 pills 2 pills     *If you feel like the midday dose is making you sleepy -- you can switch to taking 2 pill in the AM and 4 pills in the PM.     Instructions: Gabapentin (Brand Name: Neurontin) is a pain medication that is used to help decrease activity in over-stimulated nerves, thus decreasing pain sensation. This medication needs to be increased slowly to higher doses to effectively treat your pain. Please increase the number of pills you take each day according to the table above. You need to take Gabapentin (Neurontin) every day as scheduled in order to have it work on your pain. It may take weeks or months before you start to notice any improvement, so please be patient!  We may have to make some adjustments to it in the future but we have to start slowly with this to see how you respond. Please do not take more of this medicine than has been prescribed as this could be harmful.   - The biggest side effect that people may experience is some fatigue or drowsiness when they first start which is why we typically have you start taking the medication in the evening and gradually work your way up. You can take it about 30-60 minutes before your bedtime.   -If you cannot tolerate the medicine during the daytime because it makes you sleepy, then you can just take it in the evening. If the medicine makes you too sleepy the following day, then take the medicine a little bit earlier before bedtime.   -There are a lot of dosage options with this medicine and so we may need to make some adjustments to figure out what works best for you.     Precautions:  If you miss a dose, take it as soon as you can. However, if it is less than one hour before your next dose, skip the missed dose and follow your regular schedule.   Until you know how alert you are when you use this medication, do not perform activities that could injure you or others, such as driving or using  machinery.   Occasionally there is swelling in the feet and hands.   Do not drink alcohol while taking this medication.   Keep this and all medications out of the reach of children.   If you experience any side effects, dizziness or sleepiness, you can increase the number of days you are at each dose before moving up to the next higher dose. These symptoms are generally short lived if you do even have them and they tend to improve in a few days. Otherwise, you can decrease back down to the previous dose level you were at.   Do not abruptly discontinue this medication as you will need to be weaned off

## 2022-06-11 ENCOUNTER — Ambulatory Visit: Admit: 2022-06-11 | Discharge: 2022-06-12 | Payer: PRIVATE HEALTH INSURANCE

## 2022-06-11 DIAGNOSIS — L732 Hidradenitis suppurativa: Secondary | ICD-10-CM | POA: Diagnosis not present

## 2022-06-11 MED ADMIN — diphenhydrAMINE (BENADRYL) capsule/tablet 25 mg: 25 mg | ORAL | @ 15:00:00 | Stop: 2022-06-11

## 2022-06-11 MED ADMIN — acetaminophen (TYLENOL) tablet 650 mg: 650 mg | ORAL | @ 15:00:00 | Stop: 2022-06-11

## 2022-06-11 MED ADMIN — inFLIXimab-axxq (AVSOLA) 10 mg/kg = 900 mg in sodium chloride (NS) 250 mL IVPB: 10 mg/kg | INTRAVENOUS | @ 15:00:00 | Stop: 2022-06-11

## 2022-06-11 MED ADMIN — cetirizine (ZYRTEC) tablet 10 mg: 10 mg | ORAL | @ 15:00:00 | Stop: 2022-06-11

## 2022-06-11 NOTE — Unmapped (Signed)
Patient presents for standard Avsola (infliximab-axxq) infusion.  In no acute distress, VSS.  Reports no new medical issues or S/S of infection.  PIV placed in left hand and secured with Tegaderm, blood return visualized. Patient aware of potential reactions/side effects, call bell within reach. See MAR for premeds.    1114 Avsola (infliximab-axxq) 900 mg infusing as follows:     2ml/hr x 15 min  74ml/hr x 15 min  41ml/hr x 15 min  66ml/hr x 15 min  164ml/hr x 30 min  219ml/hr for the remainder of the infusion.    1314 Avsola (infliximab-axxq) infusion complete.      Infusion tolerated well, no s/s of adverse reaction, VSS. PIV flushed per protocol then d/c'd, no bleeding, bruising or swelling noted, gauze and coban applied. Patient discharged from Infusion Center in stable condition.

## 2022-06-13 ENCOUNTER — Encounter: Payer: Self-pay | Admitting: Physician Assistant

## 2022-06-19 ENCOUNTER — Other Ambulatory Visit: Payer: Self-pay

## 2022-06-19 ENCOUNTER — Emergency Department
Admission: EM | Admit: 2022-06-19 | Discharge: 2022-06-19 | Discharge status: Against Medical Advice | Payer: BC Managed Care – PPO | Attending: Emergency Medicine | Admitting: Emergency Medicine

## 2022-06-19 DIAGNOSIS — R110 Nausea: Secondary | ICD-10-CM | POA: Diagnosis not present

## 2022-06-19 DIAGNOSIS — Z5321 Procedure and treatment not carried out due to patient leaving prior to being seen by health care provider: Secondary | ICD-10-CM | POA: Insufficient documentation

## 2022-06-19 DIAGNOSIS — R1084 Generalized abdominal pain: Secondary | ICD-10-CM | POA: Diagnosis not present

## 2022-06-19 DIAGNOSIS — R739 Hyperglycemia, unspecified: Secondary | ICD-10-CM | POA: Diagnosis not present

## 2022-06-19 DIAGNOSIS — R1031 Right lower quadrant pain: Secondary | ICD-10-CM | POA: Diagnosis not present

## 2022-06-19 DIAGNOSIS — R000 Tachycardia, unspecified: Secondary | ICD-10-CM | POA: Diagnosis not present

## 2022-06-19 LAB — CBC
HCT: 32 % — ABNORMAL LOW (ref 36.0–46.0)
Hemoglobin: 10 g/dL — ABNORMAL LOW (ref 12.0–15.0)
MCH: 27.9 pg (ref 26.0–34.0)
MCHC: 31.3 g/dL (ref 30.0–36.0)
MCV: 89.4 fL (ref 80.0–100.0)
Platelets: 342 10*3/uL (ref 150–400)
RBC: 3.58 MIL/uL — ABNORMAL LOW (ref 3.87–5.11)
RDW: 14.1 % (ref 11.5–15.5)
WBC: 15.2 10*3/uL — ABNORMAL HIGH (ref 4.0–10.5)
nRBC: 0 % (ref 0.0–0.2)

## 2022-06-19 LAB — COMPREHENSIVE METABOLIC PANEL
ALT: 11 U/L (ref 0–44)
AST: 17 U/L (ref 15–41)
Albumin: 2.4 g/dL — ABNORMAL LOW (ref 3.5–5.0)
Alkaline Phosphatase: 90 U/L (ref 38–126)
Anion gap: 7 (ref 5–15)
BUN: 7 mg/dL (ref 6–20)
CO2: 25 mmol/L (ref 22–32)
Calcium: 8.4 mg/dL — ABNORMAL LOW (ref 8.9–10.3)
Chloride: 102 mmol/L (ref 98–111)
Creatinine, Ser: 0.7 mg/dL (ref 0.44–1.00)
GFR, Estimated: >60 mL/min (ref >60)
Glucose, Bld: 123 mg/dL — ABNORMAL HIGH (ref 70–99)
Potassium: 3.7 mmol/L (ref 3.5–5.1)
Sodium: 134 mmol/L — ABNORMAL LOW (ref 135–145)
Total Bilirubin: 0.3 mg/dL (ref 0.3–1.2)
Total Protein: 8.2 g/dL — ABNORMAL HIGH (ref 6.5–8.1)

## 2022-06-19 LAB — CBG MONITORING, ED: Glucose-Capillary: 116 mg/dL — ABNORMAL HIGH (ref 70–99)

## 2022-06-19 LAB — LIPASE, BLOOD: Lipase: 32 U/L (ref 11–51)
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
## 2022-06-21 ENCOUNTER — Ambulatory Visit
Admission: RE | Admit: 2022-06-21 | Discharge: 2022-06-21 | Disposition: A | Discharge status: Home / Self Care | Payer: BC Managed Care – PPO | Source: Ambulatory Visit | Attending: Physician Assistant | Admitting: Physician Assistant

## 2022-06-21 ENCOUNTER — Telehealth: Payer: BC Managed Care – PPO | Admitting: Psychiatry

## 2022-06-21 DIAGNOSIS — G379 Demyelinating disease of central nervous system, unspecified: Secondary | ICD-10-CM

## 2022-06-21 DIAGNOSIS — M50223 Other cervical disc displacement at C6-C7 level: Secondary | ICD-10-CM | POA: Diagnosis not present

## 2022-06-21 DIAGNOSIS — Z86718 Personal history of other venous thrombosis and embolism: Secondary | ICD-10-CM

## 2022-06-21 DIAGNOSIS — G4459 Other complicated headache syndrome: Secondary | ICD-10-CM

## 2022-06-21 DIAGNOSIS — M47812 Spondylosis without myelopathy or radiculopathy, cervical region: Secondary | ICD-10-CM | POA: Diagnosis not present

## 2022-06-21 DIAGNOSIS — G43909 Migraine, unspecified, not intractable, without status migrainosus: Secondary | ICD-10-CM | POA: Diagnosis not present

## 2022-06-21 MED ORDER — GADOPICLENOL 0.5 MMOL/ML IV SOLN
9.0000 mL | Freq: Once | INTRAVENOUS | Status: AC | PRN
  Administered 2022-06-21: 9 mL via INTRAVENOUS

## 2022-06-26 ENCOUNTER — Telehealth: Payer: BC Managed Care – PPO | Admitting: Psychiatry

## 2022-06-26 ENCOUNTER — Emergency Department (HOSPITAL_BASED_OUTPATIENT_CLINIC_OR_DEPARTMENT_OTHER)
Admission: EM | Admit: 2022-06-26 | Discharge: 2022-06-27 | Disposition: A | Discharge status: Home / Self Care | Payer: BC Managed Care – PPO | Attending: Emergency Medicine | Admitting: Emergency Medicine

## 2022-06-26 ENCOUNTER — Encounter (HOSPITAL_BASED_OUTPATIENT_CLINIC_OR_DEPARTMENT_OTHER): Payer: Self-pay

## 2022-06-26 ENCOUNTER — Other Ambulatory Visit: Payer: Self-pay

## 2022-06-26 DIAGNOSIS — R109 Unspecified abdominal pain: Secondary | ICD-10-CM | POA: Diagnosis not present

## 2022-06-26 DIAGNOSIS — R509 Fever, unspecified: Secondary | ICD-10-CM | POA: Insufficient documentation

## 2022-06-26 DIAGNOSIS — R1011 Right upper quadrant pain: Secondary | ICD-10-CM | POA: Diagnosis not present

## 2022-06-26 DIAGNOSIS — Z87891 Personal history of nicotine dependence: Secondary | ICD-10-CM | POA: Insufficient documentation

## 2022-06-26 DIAGNOSIS — R112 Nausea with vomiting, unspecified: Secondary | ICD-10-CM | POA: Insufficient documentation

## 2022-06-26 DIAGNOSIS — R799 Abnormal finding of blood chemistry, unspecified: Secondary | ICD-10-CM | POA: Diagnosis not present

## 2022-06-26 DIAGNOSIS — K760 Fatty (change of) liver, not elsewhere classified: Secondary | ICD-10-CM | POA: Diagnosis not present

## 2022-06-26 LAB — URINALYSIS, ROUTINE W REFLEX MICROSCOPIC
Bilirubin Urine: NEGATIVE
Glucose, UA: NEGATIVE mg/dL
Ketones, ur: NEGATIVE mg/dL
Nitrite: NEGATIVE
Protein, ur: NEGATIVE mg/dL
Specific Gravity, Urine: 1.01 (ref 1.005–1.030)
pH: 5.5 (ref 5.0–8.0)

## 2022-06-26 LAB — COMPREHENSIVE METABOLIC PANEL
ALT: 14 U/L (ref 0–44)
AST: 14 U/L — ABNORMAL LOW (ref 15–41)
Albumin: 2.8 g/dL — ABNORMAL LOW (ref 3.5–5.0)
Alkaline Phosphatase: 116 U/L (ref 38–126)
Anion gap: 7 (ref 5–15)
BUN: 8 mg/dL (ref 6–20)
CO2: 27 mmol/L (ref 22–32)
Calcium: 8.9 mg/dL (ref 8.9–10.3)
Chloride: 99 mmol/L (ref 98–111)
Creatinine, Ser: 0.86 mg/dL (ref 0.44–1.00)
GFR, Estimated: >60 mL/min (ref >60)
Glucose, Bld: 110 mg/dL — ABNORMAL HIGH (ref 70–99)
Potassium: 3.8 mmol/L (ref 3.5–5.1)
Sodium: 133 mmol/L — ABNORMAL LOW (ref 135–145)
Total Bilirubin: 0.2 mg/dL — ABNORMAL LOW (ref 0.3–1.2)
Total Protein: 9.7 g/dL — ABNORMAL HIGH (ref 6.5–8.1)

## 2022-06-26 LAB — CBC WITH DIFFERENTIAL/PLATELET
Abs Immature Granulocytes: 0.06 10*3/uL (ref 0.00–0.07)
Basophils Absolute: 0.1 10*3/uL (ref 0.0–0.1)
Basophils Relative: 1 %
Eosinophils Absolute: 0.5 10*3/uL (ref 0.0–0.5)
Eosinophils Relative: 3 %
HCT: 33.5 % — ABNORMAL LOW (ref 36.0–46.0)
Hemoglobin: 10.8 g/dL — ABNORMAL LOW (ref 12.0–15.0)
Immature Granulocytes: 0 %
Lymphocytes Relative: 35 %
Lymphs Abs: 5.7 10*3/uL — ABNORMAL HIGH (ref 0.7–4.0)
MCH: 27.8 pg (ref 26.0–34.0)
MCHC: 32.2 g/dL (ref 30.0–36.0)
MCV: 86.3 fL (ref 80.0–100.0)
Monocytes Absolute: 0.8 10*3/uL (ref 0.1–1.0)
Monocytes Relative: 5 %
Neutro Abs: 9 10*3/uL — ABNORMAL HIGH (ref 1.7–7.7)
Neutrophils Relative %: 56 %
Platelets: 417 10*3/uL — ABNORMAL HIGH (ref 150–400)
RBC: 3.88 MIL/uL (ref 3.87–5.11)
RDW: 14.4 % (ref 11.5–15.5)
Smear Review: NORMAL
WBC: 16.2 10*3/uL — ABNORMAL HIGH (ref 4.0–10.5)
nRBC: 0 % (ref 0.0–0.2)

## 2022-06-26 LAB — CBG MONITORING, ED: Glucose-Capillary: 94 mg/dL (ref 70–99)

## 2022-06-26 LAB — URINALYSIS, MICROSCOPIC (REFLEX)

## 2022-06-26 LAB — LIPASE, BLOOD: Lipase: 41 U/L (ref 11–51)

## 2022-06-26 LAB — PREGNANCY, URINE: Preg Test, Ur: NEGATIVE

## 2022-06-26 NOTE — ED Triage Notes (Signed)
Pt arrives with c/o right sided ABD pain that started a few weeks ago. Pt endorses n/v.

## 2022-06-27 ENCOUNTER — Emergency Department (HOSPITAL_BASED_OUTPATIENT_CLINIC_OR_DEPARTMENT_OTHER): Payer: BC Managed Care – PPO

## 2022-06-27 ENCOUNTER — Encounter (HOSPITAL_BASED_OUTPATIENT_CLINIC_OR_DEPARTMENT_OTHER): Payer: Self-pay | Admitting: Emergency Medicine

## 2022-06-27 DIAGNOSIS — R1011 Right upper quadrant pain: Secondary | ICD-10-CM | POA: Diagnosis not present

## 2022-06-27 DIAGNOSIS — R109 Unspecified abdominal pain: Secondary | ICD-10-CM | POA: Diagnosis not present

## 2022-06-27 DIAGNOSIS — R200 Anesthesia of skin: Secondary | ICD-10-CM | POA: Diagnosis not present

## 2022-06-27 DIAGNOSIS — Z86718 Personal history of other venous thrombosis and embolism: Secondary | ICD-10-CM | POA: Diagnosis not present

## 2022-06-27 DIAGNOSIS — M542 Cervicalgia: Secondary | ICD-10-CM | POA: Diagnosis not present

## 2022-06-27 DIAGNOSIS — K760 Fatty (change of) liver, not elsewhere classified: Secondary | ICD-10-CM | POA: Diagnosis not present

## 2022-06-27 DIAGNOSIS — G43109 Migraine with aura, not intractable, without status migrainosus: Secondary | ICD-10-CM | POA: Diagnosis not present

## 2022-06-27 MED ORDER — SODIUM CHLORIDE 0.9 % IV BOLUS
1000.0000 mL | Freq: Once | INTRAVENOUS | Status: AC
  Administered 2022-06-27: 1000 mL via INTRAVENOUS

## 2022-06-27 MED ORDER — HYDROCODONE-ACETAMINOPHEN 5-325 MG PO TABS: 2.0000 | ORAL_TABLET | Freq: Four times a day (QID) | ORAL | 0 refills | Status: DC | PRN

## 2022-06-27 MED ORDER — HYDROMORPHONE HCL 1 MG/ML IJ SOLN
1.0000 mg | Freq: Once | INTRAMUSCULAR | Status: AC
  Administered 2022-06-27: 1 mg via INTRAVENOUS
  Filled 2022-06-27: qty 1

## 2022-06-27 MED ORDER — FOSFOMYCIN TROMETHAMINE 3 G PO PACK
3.0000 g | PACK | Freq: Once | ORAL | Status: AC
  Administered 2022-06-27: 3 g via ORAL
  Filled 2022-06-27: qty 3

## 2022-06-27 MED ORDER — ONDANSETRON HCL 4 MG/2ML IJ SOLN
4.0000 mg | Freq: Once | INTRAMUSCULAR | Status: AC
  Administered 2022-06-27: 4 mg via INTRAVENOUS
  Filled 2022-06-27: qty 2

## 2022-06-27 MED ORDER — ONDANSETRON 8 MG PO TBDP: 8.0000 mg | ORAL_TABLET | Freq: Three times a day (TID) | ORAL | 0 refills | Status: DC | PRN

## 2022-06-27 NOTE — ED Provider Notes (Signed)
[facility] Provider Note: Lowella Dell, MD, FACEP  CSN: [id number] MRN: [id number] ARRIVAL: 06/26/22 at 2203 ROOM: MH07/MH07   CHIEF COMPLAINT  Flank Pain   HISTORY OF PRESENT ILLNESS  06/27/22 12:17 AM JA PISTOLE is a 32 y.o. female who has had intermittent right flank pain for about the past several weeks.  It became severe this morning and she currently rates it as a 10 out of 10.  It is worse with movement or palpation.  It has been associated with nausea and vomiting but no diarrhea.  She has had a temperature as high as 99.5.  She denies any urinary complaints.  She denies vaginal bleeding or discharge.   Past Medical History:  Diagnosis Date   ADHD (attention deficit hyperactivity disorder)    Bipolar disorder    Chronic tonsillitis 08/2013   current strep, will finish antibiotic 09/04/2013; snores during sleep, mother denies apnea   Heart murmur    "when I was born; outgrew it; I was a preemie"   Hidradenitis    Hidradenitis 2017   Migraine 12/19/2016   "I've just had this one for 3 days" (12/20/2016)   Septic shock 07/02/2021    Past Surgical History:  Procedure Laterality Date   AXILLARY HIDRADENITIS EXCISION     AXILLARY LYMPH NODE BIOPSY Left 01/06/2019   CYSTOSCOPY W/ URETERAL STENT PLACEMENT Right 05/28/2017   Procedure: CYSTOSCOPY WITH RETROGRADE PYELOGRAM/ RIGHT URETERAL STENT PLACEMENT;  Surgeon: Crist Fat, MD;  Location: [facility];  Service: Urology;  Laterality: Right;   HYDRADENITIS EXCISION Bilateral 04/03/2017   Procedure: EXCISION AND DRAINAGE BILATERAL  HIDRADENITIS AXILLA;  Surgeon: Griselda Miner, MD;  Location: [facility] OR;  Service: General;  Laterality: Bilateral;   INCISION AND DRAINAGE ABSCESS Right 07/20/2016   Procedure: INCISION AND DRAINAGE ABSCESS right axilla;  Surgeon: Abigail Miyamoto, MD;  Location: [facility] OR;  Service: General;  Laterality: Right;   INTRAUTERINE DEVICE (IUD) INSERTION  11/2016   "had the one in my left  arm removed"   IRRIGATION AND DEBRIDEMENT ABSCESS Right 12/21/2016   Procedure: IRRIGATION AND DEBRIDEMENT RIGHT AXILLARY HIDRADENITIS;  Surgeon: Andria Meuse, MD;  Location: [facility] OR;  Service: General;  Laterality: Right;   IRRIGATION AND DEBRIDEMENT ABSCESS Left 01/08/2017   Procedure: IRRIGATION AND DEBRIDEMENT AXILLARY ABSCESS;  Surgeon: Griselda Miner, MD;  Location: [facility] OR;  Service: General;  Laterality: Left;   NASAL SEPTOPLASTY W/ TURBINOPLASTY Bilateral 02/25/2019   Procedure: NASAL SEPTOPLASTY WITH TURBINATE REDUCTION;  Surgeon: Serena Colonel, MD;  Location: [facility];  Service: ENT;  Laterality: Bilateral;   TONSILLECTOMY Bilateral 09/14/2013   Procedure: BILATERAL TONSILLECTOMY;  Surgeon: Serena Colonel, MD;  Location: [facility];  Service: ENT;  Laterality: Bilateral;   TYMPANOPLASTY Bilateral    "rebuilt eardrums"   WISDOM TOOTH EXTRACTION      Family History  Problem Relation Age of Onset   Hypertension Mother    Hyperlipidemia Mother    Diabetes Mother    Hyperlipidemia Father    Heart attack Father 51   Alcohol abuse Paternal Uncle    Parkinson's disease Maternal Grandmother    Dementia Maternal Grandmother    Diabetes Maternal Grandmother    Bell's palsy Maternal Grandmother     Social History   Tobacco Use   Smoking status: Never   Smokeless tobacco: Never  Vaping Use   Vaping Use: Former   Quit date: 07/03/2021   Substances: CBD  Substance Use Topics  Alcohol use: Not Currently    Comment: once a month   Drug use: Not Currently    Types: Marijuana    Prior to Admission medications   Medication Sig Start Date End Date Taking? Authorizing Provider  HYDROcodone-acetaminophen (NORCO) 5-325 MG tablet Take 2 tablets by mouth every 6 (six) hours as needed for severe pain. 06/27/22  Yes Yu Cragun, MD  ondansetron (ZOFRAN-ODT) 8 MG disintegrating tablet Take 1 tablet (8 mg total) by mouth every 8 (eight) hours as needed for  vomiting or nausea. 06/27/22  Yes Luccas Towell, MD  albuterol (VENTOLIN HFA) 108 (90 Base) MCG/ACT inhaler TAKE 2 PUFFS BY MOUTH EVERY 6 HOURS AS NEEDED FOR WHEEZE OR SHORTNESS OF BREATH 06/07/22   Eden Emms, NP  benzonatate (TESSALON) 100 MG capsule Take 1-2 tablets 3 times a day as needed for cough 02/28/22   Immordino, Stephen, FNP  bifidobacterium infantis (ALIGN) capsule Take 1 capsule by mouth daily.    [provider]  busPIRone (BUSPAR) 10 MG tablet TAKE 1 TABLET BY MOUTH TWICE A DAY 02/06/22   Jomarie Longs, MD  clindamycin (CLEOCIN) 300 MG capsule Take 300 mg by mouth 2 (two) times daily. 11/09/21   [provider]  COSENTYX SENSOREADY, 300 MG, 150 MG/ML SOAJ Inject 2 Syringes into the skin every 28 (twenty-eight) days. 10/30/21   [provider]  diphenhydrAMINE (BENADRYL) 25 mg capsule 25 mg every 6 (six) hours as needed.    [provider]  doxycycline (ADOXA) 100 MG tablet Take 1 tablet (100 mg total) by mouth 2 (two) times daily. 05/28/22 06/27/22  Georga Hacking, MD  EPINEPHrine (EPIPEN 2-PAK) 0.3 mg/0.3 mL IJ SOAJ injection Inject 0.3 mg into the muscle as needed for anaphylaxis. 02/15/22   Eden Emms, NP  glucose blood (CONTOUR TEST) test strip And lancets #100 05/18/21   Romero Belling, MD  hydrOXYzine (ATARAX) 25 MG tablet TAKE 1 TABLET BY MOUTH EVERY 8 HOURS AS NEEDED FOR ITCHING. 01/16/22   Worthy Rancher B, FNP  inFLIXimab (REMICADE IV) Inject 1 Dose into the vein every 30 (thirty) days.    [provider]  Iron-Vitamin C 100-250 MG TABS Take 1 tablet by mouth daily.    [provider]  lamoTRIgine (LAMICTAL) 100 MG tablet TAKE 0.5 TABLETS BY MOUTH 2 TIMES DAILY. 05/24/22   Jomarie Longs, MD  methylphenidate (RITALIN) 20 MG tablet Take 0.5 tablets (10 mg total) by mouth daily as needed. 02/15/22   Eden Emms, NP  methylphenidate (RITALIN) 20 MG tablet Take 1 tablet (20 mg total) by mouth daily before breakfast.  05/16/22 06/15/22  Eden Emms, NP  methylphenidate (RITALIN) 20 MG tablet Take 1 tablet (20 mg total) by mouth daily before breakfast. NEED OFFICE VISIT FOR FURTHER REFILLS 05/16/22 06/15/22  Eden Emms, NP  methylphenidate (RITALIN) 20 MG tablet Take 1 tablet (20 mg total) by mouth daily before breakfast. 05/16/22   Eden Emms, NP  Microlet Lancets MISC See admin instructions. 05/18/21   [provider]  montelukast (SINGULAIR) 10 MG tablet TAKE 1 TABLET BY MOUTH EVERYDAY AT BEDTIME 05/16/22   Eden Emms, NP  norethindrone (MICRONOR) 0.35 MG tablet Take 1 tablet by mouth daily. 07/19/21   [provider]  traZODone (DESYREL) 50 MG tablet TAKE 1 TABLET BY MOUTH EVERYDAY AT BEDTIME 11/10/21   Gweneth Dimitri, MD    Allergies Other, Shellfish allergy, Shellfish-derived products, Tapentadol, Tape, Ginger, Morphine and related, and Vancomycin  REVIEW OF SYSTEMS  Negative except as noted here or in the History of Present Illness.   PHYSICAL EXAMINATION  Initial Vital Signs Blood pressure 105/71, pulse (!) 107, temperature 98.5 F (36.9 C), temperature source Oral, resp. rate 18, weight 88.5 kg, SpO2 97 %.  Examination General: Well-developed, well-nourished female in no acute distress; appearance consistent with age of record HENT: normocephalic; atraumatic Eyes: Normal appearance Neck: supple Heart: regular rate and rhythm Lungs: clear to auscultation bilaterally Abdomen: soft; nondistended; mild right upper quadrant tenderness; bowel sounds present GU: Right CVA tenderness Extremities: No deformity; full range of motion; pulses normal Neurologic: Awake, alert and oriented; motor function intact in all extremities and symmetric; no facial droop Skin: Warm and dry Psychiatric: Grimacing   RESULTS  Summary of this visit's results, reviewed and interpreted by myself:   EKG Interpretation  Date/Time:    Ventricular Rate:    PR Interval:    QRS Duration:    QT Interval:    QTC Calculation:   R Axis:     Text Interpretation:         Laboratory Studies: Results for orders placed or performed during the hospital encounter of 06/26/22 (from the past 24 hour(s))  CBG monitoring, ED     Status: None   Collection Time: 06/26/22 10:14 PM  Result Value Ref Range   Glucose-Capillary 94 70 - 99 mg/dL   Comment 1 Notify RN   Urinalysis, Routine w reflex microscopic -Urine, Clean Catch     Status: Abnormal   Collection Time: 06/26/22 10:19 PM  Result Value Ref Range   Color, Urine YELLOW YELLOW   APPearance CLEAR CLEAR   Specific Gravity, Urine 1.010 1.005 - 1.030   pH 5.5 5.0 - 8.0   Glucose, UA NEGATIVE NEGATIVE mg/dL   Hgb urine dipstick TRACE (A) NEGATIVE   Bilirubin Urine NEGATIVE NEGATIVE   Ketones, ur NEGATIVE NEGATIVE mg/dL   Protein, ur NEGATIVE NEGATIVE mg/dL   Nitrite NEGATIVE NEGATIVE   Leukocytes,Ua SMALL (A) NEGATIVE  Urinalysis, Microscopic (reflex)     Status: Abnormal   Collection Time: 06/26/22 10:19 PM  Result Value Ref Range   RBC / HPF 0-5 0 - 5 RBC/hpf   WBC, UA 11-20 0 - 5 WBC/hpf   Bacteria, UA RARE (A) NONE SEEN   Squamous Epithelial / HPF 0-5 0 - 5 /HPF  Pregnancy, urine     Status: None   Collection Time: 06/26/22 10:20 PM  Result Value Ref Range   Preg Test, Ur NEGATIVE NEGATIVE  Lipase, blood     Status: None   Collection Time: 06/26/22 10:57 PM  Result Value Ref Range   Lipase 41 11 - 51 U/L  Comprehensive metabolic panel     Status: Abnormal   Collection Time: 06/26/22 10:57 PM  Result Value Ref Range   Sodium 133 (L) 135 - 145 mmol/L   Potassium 3.8 3.5 - 5.1 mmol/L   Chloride 99 98 - 111 mmol/L   CO2 27 22 - 32 mmol/L   Glucose, Bld 110 (H) 70 - 99 mg/dL   BUN 8 6 - 20 mg/dL   Creatinine, Ser 3.82 0.44 - 1.00 mg/dL   Calcium 8.9 8.9 - 50.5 mg/dL   Total Protein 9.7 (H) 6.5 - 8.1 g/dL   Albumin 2.8 (L) 3.5 - 5.0 g/dL   AST 14 (L) 15 - 41 U/L   ALT 14 0 - 44 U/L   Alkaline Phosphatase 116 38  - 126 U/L  Total Bilirubin 0.2 (L) 0.3 - 1.2 mg/dL   GFR, Estimated >16 >10 mL/min   Anion gap 7 5 - 15  CBC with Differential/Platelet     Status: Abnormal   Collection Time: 06/26/22 10:57 PM  Result Value Ref Range   WBC 16.2 (H) 4.0 - 10.5 K/uL   RBC 3.88 3.87 - 5.11 MIL/uL   Hemoglobin 10.8 (L) 12.0 - 15.0 g/dL   HCT 96.0 (L) 45.4 - 09.8 %   MCV 86.3 80.0 - 100.0 fL   MCH 27.8 26.0 - 34.0 pg   MCHC 32.2 30.0 - 36.0 g/dL   RDW 11.9 14.7 - 82.9 %   Platelets 417 (H) 150 - 400 K/uL   nRBC 0.0 0.0 - 0.2 %   Neutrophils Relative % 56 %   Neutro Abs 9.0 (H) 1.7 - 7.7 K/uL   Lymphocytes Relative 35 %   Lymphs Abs 5.7 (H) 0.7 - 4.0 K/uL   Monocytes Relative 5 %   Monocytes Absolute 0.8 0.1 - 1.0 K/uL   Eosinophils Relative 3 %   Eosinophils Absolute 0.5 0.0 - 0.5 K/uL   Basophils Relative 1 %   Basophils Absolute 0.1 0.0 - 0.1 K/uL   WBC Morphology MORPHOLOGY UNREMARKABLE    RBC Morphology MORPHOLOGY UNREMARKABLE    Smear Review Normal platelet morphology    Immature Granulocytes 0 %   Abs Immature Granulocytes 0.06 0.00 - 0.07 K/uL   Imaging Studies: US Abdomen Limited RUQ (LIVER/GB)  Result Date: 06/27/2022 CLINICAL DATA:  Right upper quadrant abdominal pain EXAM: ULTRASOUND ABDOMEN LIMITED RIGHT UPPER QUADRANT COMPARISON:  CT abdomen pelvis 12:31 a.m. FINDINGS: Gallbladder: No gallstones or wall thickening visualized. No sonographic Murphy sign noted by sonographer. Common bile duct: Diameter: 2-3 mm in proximal diameter Liver: Hepatic parenchymal echogenicity is diffusely increased and there is mild coarsening of the hepatic echotexture in keeping with mild hepatic steatosis. No focal intrahepatic masses are seen and there is no intrahepatic biliary ductal dilation. Portal vein is patent on color Doppler imaging with normal direction of blood flow towards the liver. Other: None. IMPRESSION: 1. No acute abnormality. 2. Mild hepatic steatosis. Electronically Signed   By: Helyn Numbers M.D.   On: 06/27/2022 01:40   CT Renal Stone Study  Result Date: 06/27/2022 CLINICAL DATA:  Abdominal/flank pain, stone suspected. Right abdominal pain, nausea, vomiting EXAM: CT ABDOMEN AND PELVIS WITHOUT CONTRAST TECHNIQUE: Multidetector CT imaging of the abdomen and pelvis was performed following the standard protocol without IV contrast. RADIATION DOSE REDUCTION: This exam was performed according to the departmental dose-optimization program which includes automated exposure control, adjustment of the mA and/or kV according to patient size and/or use of iterative reconstruction technique. COMPARISON:  05/27/2022 FINDINGS: Lower chest: No acute abnormality. Hepatobiliary: No focal liver abnormality is seen. No gallstones, gallbladder wall thickening, or biliary dilatation. Pancreas: Unremarkable Spleen: Unremarkable Adrenals/Urinary Tract: Adrenal glands are unremarkable. Kidneys are normal, without renal calculi, focal lesion, or hydronephrosis. Bladder is unremarkable. Stomach/Bowel: Stomach is within normal limits. Appendix appears normal. No evidence of bowel wall thickening, distention, or inflammatory changes. Vascular/Lymphatic: No significant vascular findings are present. No enlarged abdominal or pelvic lymph nodes. Reproductive: There is marked dermal thickening involving the labia bilaterally which may be infectious or related to a superficial infiltrative process such as a cutaneous neoplasm. There is associated shotty bilateral inguinal adenopathy, nonspecific, possibly reactive in nature. Index lymph nodes measure 17-18 mm in short axis diameter. The pelvic organs are unremarkable. Other: No abdominal  wall hernia. Musculoskeletal: No acute bone abnormality. No lytic or blastic bone lesion. IMPRESSION: 1. No acute intra-abdominal pathology identified. 2. Marked dermal thickening involving the labia bilaterally which may be infectious or related to a superficial infiltrative process such as  a cutaneous neoplasm. Associated shotty bilateral inguinal adenopathy, nonspecific, possibly reactive in nature. Correlation with clinical examination is recommended. Electronically Signed   By: Helyn Numbers M.D.   On: 06/27/2022 00:57    ED COURSE and MDM  Nursing notes, initial and subsequent vitals signs, including pulse oximetry, reviewed and interpreted by myself.  Vitals:   06/26/22 2209 06/26/22 2209 06/27/22 0145  BP:  105/71 110/75  Pulse:  (!) 107 94  Resp:  18 17  Temp:  98.5 F (36.9 C) 98.5 F (36.9 C)  TempSrc:  Oral Oral  SpO2:  97% 97%  Weight: 88.5 kg     Medications  fosfomycin (MONUROL) packet 3 g (has no administration in time range)  sodium chloride 0.9 % bolus 1,000 mL ( Intravenous Stopped 06/27/22 0201)  ondansetron (ZOFRAN) injection 4 mg (4 mg Intravenous Given 06/27/22 0101)  HYDROmorphone (DILAUDID) injection 1 mg (1 mg Intravenous Given 06/27/22 0101)   1:14 AM The patient states her labial swelling is associated with hidradenitis suppurativa for which she sees a specialist.  This would explain the findings on the CT scan.  Since she is having some discomfort in the right upper quadrant as well as the right flank we will go ahead and get a gallbladder ultrasound to evaluate for gallbladder pathology that may have been missed by the CT scan.  2:12 AM No evidence of gallbladder disease.  The cause of the patient's pain is unclear but it could be musculoskeletal or radicular.  Her urinalysis suggest an early UTI and will treat with a dose of fosfomycin.   PROCEDURES  Procedures   ED DIAGNOSES     ICD-10-CM   1. Right flank pain  R10.9     2. Nausea and vomiting in adult  R11.2          Paula Libra, MD 06/27/22 [phone]

## 2022-06-28 LAB — URINE CULTURE: Culture: 20000 — AB

## 2022-06-29 ENCOUNTER — Telehealth (HOSPITAL_BASED_OUTPATIENT_CLINIC_OR_DEPARTMENT_OTHER): Payer: Self-pay | Admitting: *Deleted

## 2022-06-29 NOTE — Telephone Encounter (Signed)
Post ED Visit - Positive Culture Follow-up  Culture report reviewed by antimicrobial stewardship pharmacist: [facility] []  Enzo Bi, Pharm.D. []  Celedonio Miyamoto, Pharm.D., BCPS AQ-ID []  Garvin Fila, Pharm.D., BCPS []  Georgina Pillion, [address].D., BCPS []  [place], [place].D., BCPS, AAHIVP []  Estella Husk, Pharm.D., BCPS, AAHIVP []  Lysle Pearl, PharmD, BCPS []  Phillips Climes, PharmD, BCPS []  Agapito Games, PharmD, BCPS []  Verlan Friends, PharmD []  Mervyn Gay, PharmD, BCPS []  Vinnie Level, PharmD  [facility] []  Len Childs, PharmD []  Greer Pickerel, PharmD []  Adalberto Cole, PharmD []  Perlie Gold, Rph []  Lonell Face) Jean Rosenthal, PharmD []  Earl Many, PharmD []  Junita Push, PharmD []  Dorna Leitz, PharmD []  Terrilee Files, PharmD []  Lynann Beaver, PharmD []  Keturah Barre, PharmD []  Loralee Pacas, PharmD []  Bernadene Person, PharmD   Positive urine culture Treated with Fosfomycin , organism sensitive to the same and no further patient follow-up is required at this time.  Virl Axe [facility] 06/29/2022, 10:16 AM

## 2022-07-02 NOTE — Unmapped (Signed)
[facility] Refill Coordination Note    Melissa Pearson, [name]: [birth year]  Phone: [phone] (home) [phone] (work)      All above HIPAA information was verified with patient.         06/29/2022     9:10 AM   Specialty Rx Medication Refill Questionnaire   Which Medications would you like refilled and shipped? Cosentyx   Please list all current allergies: Morphine   Have you missed any doses in the last 30 days? No   Have you had any changes to your medication(s) since your last refill? Yes   Please list your medication(s) changes below. Gabapentin   How many days remaining of each medication do you have at home? 0   If receiving an injectable medication, next injection date is 07/06/2022   Have you experienced any side effects in the last 30 days? No   Please enter the full address (street address, city, state, zip code) where you would like your medication(s) to be delivered to. [address], [place] [address]   Please specify on which day you would like your medication(s) to arrive. Note: if you need your medication(s) within 3 days, please call the pharmacy to schedule your order at [phone]  07/04/2022   Has your insurance changed since your last refill? No   Would you like a pharmacist to call you to discuss your medication(s)? No   Do you require a signature for your package? (Note: if we are billing Medicare Part B or your order contains a controlled substance, we will require a signature) No         Completed refill call assessment today to schedule patient's medication shipment from the [facility] [phone]).  All relevant notes have been reviewed.       Confirmed patient received a Conservation officer, historic buildings and a Surveyor, mining with first shipment. The patient will receive a drug information handout for each medication shipped and additional FDA Medication Guides as required.         REFERRAL TO PHARMACIST     Referral to the pharmacist: Not needed      [facility]     Shipping address confirmed in Epic.     Delivery Scheduled: Yes, Expected medication delivery date: 07/04/2022.     Medication will be delivered via UPS to the prescription address in Epic WAM.    Melissa Pearson   [facility] Specialty Technician

## 2022-07-03 MED FILL — COSENTYX PEN 300 MG/2 PENS (150 MG/ML) SUBCUTANEOUS: SUBCUTANEOUS | 28 days supply | Qty: 2 | Fill #7

## 2022-07-04 DIAGNOSIS — L732 Hidradenitis suppurativa: Principal | ICD-10-CM

## 2022-07-05 ENCOUNTER — Other Ambulatory Visit: Payer: Self-pay | Admitting: Nurse Practitioner

## 2022-07-05 DIAGNOSIS — H7292 Unspecified perforation of tympanic membrane, left ear: Secondary | ICD-10-CM | POA: Diagnosis not present

## 2022-07-05 DIAGNOSIS — H9213 Otorrhea, bilateral: Secondary | ICD-10-CM | POA: Diagnosis not present

## 2022-07-05 DIAGNOSIS — J4520 Mild intermittent asthma, uncomplicated: Secondary | ICD-10-CM

## 2022-07-05 DIAGNOSIS — J352 Hypertrophy of adenoids: Secondary | ICD-10-CM | POA: Diagnosis not present

## 2022-07-09 ENCOUNTER — Ambulatory Visit: Admit: 2022-07-09 | Discharge: 2022-07-10 | Payer: PRIVATE HEALTH INSURANCE

## 2022-07-09 DIAGNOSIS — L732 Hidradenitis suppurativa: Secondary | ICD-10-CM | POA: Diagnosis not present

## 2022-07-09 LAB — CBC W/ AUTO DIFF
BASOPHILS ABSOLUTE COUNT: 0.1 10*9/L (ref 0.0–0.1)
BASOPHILS RELATIVE PERCENT: 0.8 %
EOSINOPHILS ABSOLUTE COUNT: 0.7 10*9/L — ABNORMAL HIGH (ref 0.0–0.5)
EOSINOPHILS RELATIVE PERCENT: 5.6 %
HEMATOCRIT: 32.5 % — ABNORMAL LOW (ref 34.0–44.0)
HEMOGLOBIN: 10.8 g/dL — ABNORMAL LOW (ref 11.3–14.9)
LYMPHOCYTES ABSOLUTE COUNT: 3.9 10*9/L — ABNORMAL HIGH (ref 1.1–3.6)
LYMPHOCYTES RELATIVE PERCENT: 31.9 %
MEAN CORPUSCULAR HEMOGLOBIN CONC: 33.1 g/dL (ref 32.0–36.0)
MEAN CORPUSCULAR HEMOGLOBIN: 27.8 pg (ref 25.9–32.4)
MEAN CORPUSCULAR VOLUME: 84.1 fL (ref 77.6–95.7)
MEAN PLATELET VOLUME: 7.4 fL (ref 6.8–10.7)
MONOCYTES ABSOLUTE COUNT: 0.6 10*9/L (ref 0.3–0.8)
MONOCYTES RELATIVE PERCENT: 4.8 %
NEUTROPHILS ABSOLUTE COUNT: 6.9 10*9/L (ref 1.8–7.8)
NEUTROPHILS RELATIVE PERCENT: 56.9 %
PLATELET COUNT: 383 10*9/L (ref 150–450)
RED BLOOD CELL COUNT: 3.87 10*12/L — ABNORMAL LOW (ref 3.95–5.13)
RED CELL DISTRIBUTION WIDTH: 15.1 % (ref 12.2–15.2)
WBC ADJUSTED: 12.2 10*9/L — ABNORMAL HIGH (ref 3.6–11.2)

## 2022-07-09 LAB — AST: AST (SGOT): 14 U/L (ref <=34)

## 2022-07-09 LAB — C-REACTIVE PROTEIN: C-REACTIVE PROTEIN: 62 mg/L — ABNORMAL HIGH (ref <=10.0)

## 2022-07-09 LAB — CREATININE
CREATININE: 0.76 mg/dL
EGFR CKD-EPI (2021) FEMALE: >90 mL/min/{1.73_m2} (ref >=60)

## 2022-07-09 LAB — SEDIMENTATION RATE: ERYTHROCYTE SEDIMENTATION RATE: >130 mm/h — ABNORMAL HIGH (ref 0–20)

## 2022-07-09 LAB — ALT: ALT (SGPT): 10 U/L (ref 10–49)

## 2022-07-09 LAB — BUN: BLOOD UREA NITROGEN: 9 mg/dL (ref 9–23)

## 2022-07-09 MED ADMIN — acetaminophen (TYLENOL) tablet 650 mg: 650 mg | ORAL | @ 16:00:00 | Stop: 2022-07-09

## 2022-07-09 MED ADMIN — inFLIXimab-axxq (AVSOLA) 10 mg/kg = 900 mg in sodium chloride (NS) 250 mL IVPB: 10 mg/kg | INTRAVENOUS | @ 17:00:00 | Stop: 2022-07-09

## 2022-07-09 MED ADMIN — diphenhydrAMINE (BENADRYL) capsule/tablet 25 mg: 25 mg | ORAL | @ 16:00:00 | Stop: 2022-07-09

## 2022-07-09 MED ADMIN — cetirizine (ZYRTEC) tablet 10 mg: 10 mg | ORAL | @ 16:00:00 | Stop: 2022-07-09

## 2022-07-09 NOTE — Unmapped (Signed)
Patient presents for standard Avsola (infliximab-axxq) infusion.  In no acute distress, VSS.  Reports no new medical issues or S/S of infection.  PIV placed in left forearm and secured with Tegaderm, blood return visualized. Patient aware of potential reactions/side effects, call bell within reach. See MAR for premeds.     1233 Avsola (infliximab-axxq) 900 mg infusing as follows:      16ml/hr x 15 min  40ml/hr x 15 min  14ml/hr x 15 min  1ml/hr x 15 min  1103ml/hr x 30 min  275ml/hr for the remainder of the infusion.     1433 Avsola (infliximab-axxq) infusion complete.       Infusion tolerated well, no s/s of adverse reaction, VSS. PIV flushed per protocol then d/c'd, no bleeding, bruising or swelling noted, gauze and coban applied. Patient discharged from Infusion Center in stable condition.

## 2022-07-10 ENCOUNTER — Other Ambulatory Visit: Payer: Self-pay | Admitting: Psychiatry

## 2022-07-10 DIAGNOSIS — F3177 Bipolar disorder, in partial remission, most recent episode mixed: Secondary | ICD-10-CM

## 2022-07-17 LAB — INFLIXIMAB/INFLIX ANTIBODY: INFLIXIMAB: 8.3 ug/mL

## 2022-07-18 ENCOUNTER — Other Ambulatory Visit: Payer: Self-pay | Admitting: Family

## 2022-07-18 DIAGNOSIS — Z91048 Other nonmedicinal substance allergy status: Secondary | ICD-10-CM

## 2022-07-18 NOTE — Unmapped (Signed)
Date of Visit:  07/18/2022    CHIEF COMPLAINT: Pain at {Painful body parts:27088}    PAIN DESCRIPTION: Melissa Pearson is a 32 y.o. female who is here today for a follow-up appointment.     The pain is located at the aforementioned areas. During the past week, the pain has been on average a {Pain Score:[phone]} in intensity. Pain has interfered with enjoyment of life {Pain Score:[phone]}, with 0 meaning not at all and 10/10 being complete interference. The pain has interfered with general activity {Pain Score:[phone]}, with 0/10 meaning not at all and 10/10 meaning complete interference. The PEG Score at today's visit is: ***.      The pain is described as {PainDescription:75779}. Things that make it better are {PainFactors:75780}, and things that make it worse are {PainFactors:75780}. The pain treatments we are providing allow the patient to function better in the following aspects: {Function:75781}.         ----  Referring Provider: Kerby Nora Sayed,MD  PCP: Dayton Bailiff, MD  Date of Initial consult: 06/07/2022  PSYCH: [facility]     PAIN HISTORY: ( SB /   ) Very pleasant patient referred for evaluation of chronic pain associated with wounds from Stage 3 hidradentitis suppurativa. Has wounds throughout her axilla, under breasts, groins and buttocks. Follows with dermatology who manage her cosentyx and remicade infusions. Works full time as an Museum/gallery exhibitions officer and tries to not let the pain limit her. She follows with psychiatry for her Bipolar and A&D. She has had a few small rxs for norco 5mg  to use sparingly for pain and ideally hopes to use as last resort as she has had friends who have overdosed and concerns for that.     SOC Hx: single, lives with mother and grandmother, works full time as EMT, denies etoh or drug use , has friends that have had hx of substance abuse and overdose on opioids so hopes to avoid as much as possible   PMHx: Bipolar, Hidradenitis Suprrativa, hx of PE due to [facility] but has come off eliquis  CPS Plan: UDS (can consider Norco or percocet 5mg  #20 a month PRN), gabapentin trial, meloxicam,     PAST PAIN RELEVANT SURGERIES:  03/2017 - excision of axilla sweat glands  03/2022 - excision of groin sweat glands      PAST PAIN TREATMENTS TRIED:  ice/heat therapy  pain medications  physical therapy ([facility])   surgery    PAST INJECTIONS DONE:      PERTINENT RADIOGRAPHIC STUDIES:      PAST MEDICATIONS TRIED AND EFFECT:  Class Med Tried-Response   OTC  Acetaminophen   NSAIDs Ibuprofen   Meloxicam 7.5mg  bid prn Trial 06/07/2022   Weak mu agonist  Tramadol (not much help)    Anti-epileptic  Gabapentin 300-600mg  tid trial 06/07/2022   Tricicylics, other antidepr.  Abilify 5mg  (prescribed by psych)   Venlafaxine (prescribed by psych)   Lamictal 100mg  bid   Buspar 10mg     Muscle relaxant     Topicals     Miscellaneous  Invanz injection    Remicade infusions  Ritalin (prescribed by psych)   Cosentx  Hydroxyzine    Opioids  Tapentaol (allergy)   Norco 5mg  (minor relief)   Morphine (allergy- became angry)      The following measures were taken to ensure proper prescribing of opioid medications:  - Opioid agreement was signed on:  N/A - Patient is NOT under a current pain contract with [facility]  [facility]. We do NOT manage this patient's opioid medications.     - Baseline PEG (06/07/2022) IF off opioids: 10    - Last Psych screening test done: 06/07/2022      Result: ORT = 4 (moderate risk)     - [place]ware [place] reviewed and any notable findings:   06/07/2022 - ok    - Last urine drug screen:   06/07/2022 - baseline UDS expect ritalin, gabapentin and hydrocodone -unexpected for some methamphetamine?? + tramadol BUT no hydrocodone ??    - NCO Database accessed:     - Current MME if prescribed opioids:     - H/O Aberrancies:     2024 ANNUAL MIPS REPORTING MEASURES (Last done: 06/07/2022)    # Description Screening Result Plan   47 Advance Care Plan (ACP)  % of patients  > 35 yo who have an advance care plan or surrogate decision maker documented in the med record or documentation an ACP was discussed  - Patient is < 65 on date of encounter and does not qualify for this measure      134 Preventive Care & Screening for Depression & F/U Plan  % pts  > 68 yo using depression screening tool (PRIME MD-PHQ-2) and if positive, a follow up plan is documented on the date or 2 days after the date.  - Patient has been diagnosed with bipolar disorder. Not eligible for this measure. [Z6109]   178 Rheumatoid Arthritis: Functional Status Assessment  % of patients aged  > 6 yo with a dx of RA for whom a functional status assessment was performed at least once within 12 months.  - Patient does NOT have a diagnosis of Rheumatoid arthritis and does not qualify for this measure.   226 Tobacco Use: Screening & Cessation Intervention  Patient use any type of tobacco?  - Patient screened for tobacco use & identified as a tobacco NON-USER. [Performed met (G9903)]   318 Falls Risk  % of patients  > 53 yo who were screened for future fall risk during the measurement period  - Patient is < 57 yo on date of encounter and does NOT qualify for this measure    155 Falls:   Plan of Care % of patients  > 6 yo with 2 or more falls per yr with a falls plan of care documented  - Patient is < 16 years old on date of encounter and does not qualify for this measure      317 Preventive Care & Screening for High BP & Follow-Up Documented  (Date: 07/18/2022)  % of patients aged  > 89 yo who were screened for high blood pressure AND a recommended follow-up plan    {MIPS 317 UEA:540981::- Patient has a pre-existing DIAGNOSIS of HYPERTENSION and is excluded from this measure (G9744)}       NO SHOWS AND CANCELLATIONS: None on file    ----    ALLERGIES:   Allergies   Allergen Reactions    Adhesive Tape-Silicones Hives     Pt can only tolerate cloth tape    Ginger Anaphylaxis    Other Hives     ABD pad causes rash  Pt can only tolerate cloth tape  Pt can only tolerate cloth tape    Amoxicillin-Pot Clavulanate Headache    Opioids - Morphine Analogues       I become very angry    Iodine Nausea Only    Vancomycin Other (See Comments)  Burns her vein really bad and always causes them to blow.       MEDICATIONS:   Current Outpatient Medications on File Prior to Visit   Medication Sig Dispense Refill    albuterol HFA 90 mcg/actuation inhaler ProAir HFA 90 mcg/actuation aerosol inhaler      ARIPiprazole (ABILIFY) 2 MG tablet Take 1 tablet (2 mg total) by mouth.      azelastine (ASTELIN) 137 mcg (0.1 %) nasal spray PLACE 1 SPRAY INTO BOTH NOSTRILS 2 (TWO) TIMES DAILY. USE IN EACH NOSTRIL AS DIRECTED      Bifidobacterium infantis (ALIGN ORAL) Take by mouth.      bimekizumab-bkzx 160 mg/mL AtIn Inject the contents of 2 syringes (320 mg total)  every 8 weeks. 4 mL 2    bimekizumab-bkzx 160 mg/mL AtIn Inject the contents of 2 syringes (320 mg total)  under the skin every twenty-eight (28) days. 4 mL 0    busPIRone (BUSPAR) 10 MG tablet Take 1 tablet (10 mg total) by mouth.      clindamycin (CLEOCIN) 300 MG capsule TAKE 1 CAPSULE BY MOUTH TWO TIMES A DAY. 63 capsule 11    diphenhydrAMINE (BENADRYL) 25 mg capsule/tablet Take 1 each (25 mg total) by mouth every six (6) hours as needed for itching.      [EXPIRED] doxycycline (ADOXA) 100 MG tablet Take 1 tablet (100 mg total) by mouth two (2) times a day.      empty container Misc Use as directed to dispose of Cosentyx pens. 1 each 2    EPINEPHrine (EPIPEN) 0.3 mg/0.3 mL injection INJECTAS DIRECTED AS NEEDED FOR SYSTEMIC REACTIONS  1    erythromycin (ROMYCIN) 5 mg/gram (0.5 %) ophthalmic ointment       gabapentin (NEURONTIN) 300 MG capsule Take 1-2 capsules (300-600 mg total) by mouth Three (3) times a day. (PLEASE FOLLOW TITRATION SCHEDULE IN PAPERWORK) 180 capsule 2    HYDROcodone-acetaminophen (NORCO) 5-325 mg per tablet Take 1 tablet by mouth every four to six hours as needed for moderate to severe pain 15 tablet 0    hydrOXYzine (ATARAX) 25 MG tablet Take 1 tablet (25 mg total) by mouth every eight (8) hours as needed for itching, allergies or anxiety. 20 tablet 0    infliximab (REMICADE IV) Remicade      iron,carbonyl-vitamin C 100-250 mg Tab Take 1 tablet by mouth daily.      lamoTRIgine (LAMICTAL) 100 MG tablet Take 0.5 tablets (50 mg total) by mouth two (2) times a day.      meloxicam (MOBIC) 7.5 MG tablet Take 1 tablet (7.5 mg total) by mouth two (2) times a day as needed for pain. Take with food. 60 tablet 5    methylphenidate HCl (RITALIN) 20 MG tablet Take 1 tablet (20 mg total) by mouth daily.      montelukast (SINGULAIR) 10 mg tablet TAKE 1 TABLET AT BEDTIME      norethindrone (MICRONOR) 0.35 mg tablet Take 1 tablet by mouth daily.      olopatadine (PATANOL) 0.1 % ophthalmic solution       ondansetron (ZOFRAN-ODT) 4 MG disintegrating tablet Take 1 tablet (4 mg total) by mouth.      rizatriptan (MAXALT) 10 MG tablet PLEASE SEE ATTACHED FOR DETAILED DIRECTIONS      secukinumab (COSENTYX PEN, 2 PENS,) 150 mg/mL PnIj injection Inject the contents of 2 pens (300 mg total) once weekly at weeks 0, 1, 2, 3, and 4. As loading dose. 10 mL 0  secukinumab (COSENTYX PEN, 2 PENS,) 150 mg/mL PnIj injection Inject the contents of 2 pens (300 mg total) under the skin every twenty-eight (28) days. Maintenance dose. 2 mL 11    sulfamethoxazole-trimethoprim (BACTRIM DS) 800-160 mg per tablet TAKE 1 TABLET (160 MG OF TRIMETHROPRIM TOTAL) BY MOUTH TWICE A DAY 60 tablet 3    traMADoL (ULTRAM) 50 mg tablet Take 1 tablet (50 mg total) by mouth every six (6) hours as needed for pain. 15 tablet 0    triamcinolone (KENALOG) 0.1 % ointment Apply topically Two (2) times a day. 454 g 5    venlafaxine (EFFEXOR) 37.5 MG tablet Take 1 tablet (37.5 mg total) by mouth two (2) times a day.       Current Facility-Administered Medications on File Prior to Visit   Medication Dose Route Frequency Provider Last Rate Last Admin ertapenem Pincus Sanes) injection 1 g  1 g Intravenous Once Elsie Stain, MD        povidone-iodine 10 % swab 1 Application  1 application. Each Nare Once Harvin Hazel, MD            PAST MEDICAL HISTORY:  Past Medical History:   Diagnosis Date    Acne     ADHD     Allergic     Anxiety disorder 06/28/2021    Last Assessment & Plan:    Formatting of this note might be different from the original.   Currently followed by psychiatry does have hydroxyzine and BuSpar.  Continue following with psychiatry as recommended and take medications as prescribed    Attention deficit hyperactivity disorder (ADHD), predominantly hyperactive type 02/11/2012    Last Assessment & Plan:    Formatting of this note might be different from the original.   Patient currently maintained on methylphenidate 20 mg daily.  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    Bipolar 1 disorder (CMS-HCC)     Bipolar 2 disorder, major depressive episode (CMS-HCC) 08/04/2019    Last Assessment & Plan:    Formatting of this note might be different from the original.   Patient is followed by Dr. Laurence Spates currently maintained on Lamictal and Abilify    Depression     Difficulty sleeping     Disorder of skin or subcutaneous tissue 2018    HS    Flu     had tamiflu    Headache, tension-type     Hidradenitis suppurativa 12/18/2017    Hydradenitis     Keloid     Right ear    Kidney stones     Pulmonary embolism (CMS-HCC) 07/05/2021    Last Assessment & Plan:    Formatting of this note might be different from the original.   Patient has been evaluated by oncology and has completed Eliquis 5 mg twice daily for 6 months.  07/2021 [facility]       REVIEW OF SYSTEMS:  GI: {ROS Gastrointestinal:27164::denies abdominal pain, bloating, constipation, diarrhea, heartburn, or nausea.}  Musculoskeletal: {ROSMSK:73220}  Neurological: {ROS Neurology:27160::denies confusion, dizziness, light sensitivity, loss of consciousness}  Psychiatric: {ROS Psychiatry:27161::denies anxiety, depression, difficulty sleeping, suicidal thoughts}  --Any additional pertinent positives and negatives are noted in HPI above.     PHYSICAL EXAM:  Vitals:   There were no vitals filed for this visit.  Constitutional: Well-developed, well-nourished, in no acute distress.  Psychiatric: A&O x 3 with congruent mood &  normal affect.   CV: Appears well perfused. No clubbing or cyanosis  Pulmonary: Respiratory effort adequate. Bilateral chest excursions. Speaking in full sentences.   Musculoskeletal: {CPC Musculoskeletal exam:31174} All extremity movements appear symmetric and appropriate. {Gait:73235::Smooth, heel-toe, non-antalgic gait,Not using any assistive devices for ambulation}  Neurological: CN II-XII grossly intact. Recent & remote history are intact. {NeuroExam:75778::Motor and sensory grossly normal bilaterally,Normal muscle bulk and tone without spasticity,No focal neurological deficits noted}  Integumentary: Normal coloration and turgor.     ASSESSMENT:   No diagnosis found.    PLAN:   At this visit, the following medications were prescribed with appropriate education:  Requested Prescriptions      No prescriptions requested or ordered in this encounter       Additional orders given at today's visit:  No orders of the defined types were placed in this encounter.        {[id number] WGNF:62130::-         I have personally reviewed and updated the pain description, ROS, Physical Exam, and plan today.}      FOLLOW UP:  No follow-ups on file.    ***The patient was prescribed opioid medications at this visit and has a signed opioid agreement with [facility] and the [facility]. She was informed of the policies and procedures regarding appropriate behavior in the clinic and the use of random urine drug screens to assess medication compliance. She is reminded to not drive while utilizing her medications. The patient is aware that the prescriptions are for her use only and any lost or stolen prescriptions will not be replaced. At least once every 90 days, midlevel prescribers of opioid medications consult with and have meaningful communication with an attending physician to verify that the prescription remains medically appropriate. The patient was interviewed and evaluated for risk of misuse of opioids. The prescription Drug Monitoring Program was checked for 24 months of data prior to prescribing this medication, and any discrepancies or violations with the opioid agreement were addressed with the patient and noted in progress note above. The registry was last accessed today 07/18/2022 .    This note was generated in part with voice recognition software and I apologize for any typographical errors that were not detected and corrected.    Future Appointments   Date Time Provider Department Center   07/19/2022  9:20 AM Evelina Bucy, NP [facility]   08/02/2022  9:30 AM Reva Bores, PA [facility]   08/06/2022 11:30 AM UNCTIF 19 [facility]   09/03/2022 11:30 AM UNCTIF RN 6 [facility]   10/01/2022 10:45 AM UNCTIF 08 [facility]   10/29/2022 10:45 AM UNCTIF 11 [facility]   11/26/2022 10:45 AM UNCTIF 10 [facility]         Raynelle Fanning, NP   Electronically signed 07/18/2022  3:30 PM [facility]         Raynelle Fanning, NP   Electronically signed 07/19/2022  3:59 PM

## 2022-07-19 ENCOUNTER — Ambulatory Visit
Admit: 2022-07-19 | Discharge: 2022-07-20 | Payer: PRIVATE HEALTH INSURANCE | Attending: Nurse Practitioner | Primary: Nurse Practitioner

## 2022-07-19 DIAGNOSIS — L732 Hidradenitis suppurativa: Principal | ICD-10-CM

## 2022-07-19 DIAGNOSIS — M792 Neuralgia and neuritis, unspecified: Principal | ICD-10-CM

## 2022-07-19 DIAGNOSIS — T148XXA Other injury of unspecified body region, initial encounter: Principal | ICD-10-CM

## 2022-07-19 DIAGNOSIS — R52 Pain, unspecified: Principal | ICD-10-CM

## 2022-07-19 DIAGNOSIS — G8929 Other chronic pain: Secondary | ICD-10-CM | POA: Diagnosis not present

## 2022-07-19 MED ORDER — GABAPENTIN 600 MG TABLET
ORAL_TABLET | ORAL | 5 refills | 30 days | Status: CP
Start: 2022-07-19 — End: 2023-01-15

## 2022-07-19 NOTE — Unmapped (Addendum)
Try taking gabapentin 600mg  pills now 4 times a day

## 2022-07-31 NOTE — Unmapped (Signed)
[facility] Refill Coordination Note    Delmi Ben, [place]: 11-18-1990  Phone: [phone] (home) [phone] (work)      All above HIPAA information was verified with patient.         07/27/2022    10:21 AM   Specialty Rx Medication Refill Questionnaire   Which Medications would you like refilled and shipped? Cosenyx   Please list all current allergies: Morphine   Have you missed any doses in the last 30 days? No   Have you had any changes to your medication(s) since your last refill? No   How many days remaining of each medication do you have at home? 0   If receiving an injectable medication, next injection date is 08/03/2022   Have you experienced any side effects in the last 30 days? No   Please enter the full address (street address, city, state, zip code) where you would like your medication(s) to be delivered to. [address] , [place] [address]   Please specify on which day you would like your medication(s) to arrive. Note: if you need your medication(s) within 3 days, please call the pharmacy to schedule your order at [phone]  07/27/2022   Has your insurance changed since your last refill? No   Would you like a pharmacist to call you to discuss your medication(s)? No   Do you require a signature for your package? (Note: if we are billing Medicare Part B or your order contains a controlled substance, we will require a signature) No         Completed refill call assessment today to schedule patient's medication shipment from the [facility] [phone]).  All relevant notes have been reviewed.       Confirmed patient received a Conservation officer, historic buildings and a Surveyor, mining with first shipment. The patient will receive a drug information handout for each medication shipped and additional FDA Medication Guides as required.         REFERRAL TO PHARMACIST     Referral to the pharmacist: Not needed      [facility]     Shipping address confirmed in Epic.     Delivery Scheduled: Yes, Expected medication delivery date: 5/30.     Medication will be delivered via UPS to the prescription address in Epic WAM.    [name]   [facility] Specialty Technician

## 2022-08-01 MED FILL — COSENTYX PEN 300 MG/2 PENS (150 MG/ML) SUBCUTANEOUS: SUBCUTANEOUS | 28 days supply | Qty: 2 | Fill #8

## 2022-08-02 ENCOUNTER — Ambulatory Visit
Admit: 2022-08-02 | Discharge: 2022-08-03 | Payer: PRIVATE HEALTH INSURANCE | Attending: Student in an Organized Health Care Education/Training Program | Primary: Student in an Organized Health Care Education/Training Program

## 2022-08-02 DIAGNOSIS — L408 Other psoriasis: Principal | ICD-10-CM

## 2022-08-02 DIAGNOSIS — Z79899 Other long term (current) drug therapy: Principal | ICD-10-CM

## 2022-08-02 DIAGNOSIS — L304 Erythema intertrigo: Principal | ICD-10-CM

## 2022-08-02 DIAGNOSIS — L732 Hidradenitis suppurativa: Principal | ICD-10-CM

## 2022-08-02 MED ORDER — TRIAMCINOLONE ACETONIDE 0.1 % TOPICAL OINTMENT
Freq: Two times a day (BID) | TOPICAL | 5 refills | 0.00000 days | Status: CP
Start: 2022-08-02 — End: ?

## 2022-08-02 MED ORDER — NYSTATIN 100,000 UNIT/GRAM TOPICAL OINTMENT
Freq: Two times a day (BID) | TOPICAL | 5 refills | 0.00000 days | Status: CP
Start: 2022-08-02 — End: ?

## 2022-08-02 MED ORDER — FLUCONAZOLE 100 MG TABLET
ORAL_TABLET | Freq: Every day | ORAL | 0 refills | 14.00000 days | Status: CN
Start: 2022-08-02 — End: 2022-08-16

## 2022-08-02 MED ORDER — CEFDINIR 300 MG CAPSULE
ORAL_CAPSULE | Freq: Two times a day (BID) | ORAL | 5 refills | 30.00000 days | Status: CP
Start: 2022-08-02 — End: ?

## 2022-08-02 MED ADMIN — triamcinolone acetonide (KENALOG-40) injection 40 mg: 40 mg | @ 14:00:00 | Stop: 2022-08-02

## 2022-08-02 NOTE — Unmapped (Addendum)
Take 1 capsule of cefdinir every 12 hours    Apply nystatin ointment to affected areas of rash on groin twice daily as needed for flares and itchiness.    Start bimekizumab injections (2 pens) every 4 weeks,     Continue infliximab infusions every 4 weeks.    Apply triamcinolone 0.1% twice daily to inflamed areas of HS for 2 weeks until improved then as needed for flares after that. Do not use for more than 2 weeks at a time. Use cautiously on groin because topical steroids cause skin thinning and stretch marks.    Today we did a steroid injection (triamcinolone injection). You can remove the bandaid at any time.     What is triamcinolone injection?  Triamcinolone is a steroid that prevents the release of substances in the body that cause inflammation.  Triamcinolone injection is used to treat many different types of inflammatory conditions, including severe allergic reactions, skin disorders, severe colitis, inflammation of the joints or tendons, blood cell disorders, inflammatory eye disorders, lung disorders, and problems caused by low adrenal gland hormones.  Triamcinolone is also used to treat certain skin disorders caused by autoimmune conditions such as lupus, psoriasis, lichen planus, and others.  How is triamcinolone injection given?  Triamcinolone injection is given through a needle and can be injected into different areas of the body: into a muscle, into the space around a joint or tendon, or into a lesion on the skin. A healthcare provider will give you this injection.  What are the possible side effects of triamcinolone injection?  Get emergency medical help if you have signs of an allergic reaction: hives; difficult breathing; swelling of your face, lips, tongue, or throat.  Call your doctor at once if you have:  blurred vision, tunnel vision, eye pain, or seeing halos around lights;  unusual changes in mood or behavior;    Common side effects of triamcinolone injections into skin lesions may include:  Skin changes (acne, redness, bruising, discoloration of the skin);  Local thinning of the skin and underlying fat (atrophy), this tends to improve with time  increased local hair growth, or thinning hair;  a wound that is slow to heal    This is not a complete list of side effects and others may occur. Call your doctor for medical advice about side effects. You may report side effects to FDA at 1-800-FDA-1088.     You are welcome to join the [facility] for HS of the [employer] group online at [email or web address] or in-person at our regular meetings.  You can textHS to [phone] for meeting reminders or join the group online for regular updates.  This can be a great opportunity to interact and learn from other patients and help work with the HS community.  We hope to see your there!    Hidradentis Suppurativa (pronounced ???high-drad-en-eye-tis/sup-your-uh-tee-vah???) is a chronic disease of hair follicles.  The lesions occur most commonly on areas of skin-to-skin contact: under the arms (axillary area), in the groin, around the buttocks, in the region around the anus and genitals, and on the skin between and under the breasts. In women, the underarms, groin, and breast areas are most commonly affected. Men most often have HS lesions on the buttocks and under the arms and may also have HS at the back of the neck and behind and around the ears.    What does HS look and feel like?   The first thing that someone with HS notices is a  tender, raised, red bump that looks like an under-the-skin pimple or boil. Sometimes HS lesions have two or more ???heads.???  In mild disease only an occasional boil or abscess may occur, but in more active disease there can be many new lesions every month.  Some abscesses can become larger and may open and drain pus.  Bleeding and increased odor can also occur. In severe disease, deeper abscesses develop and may connect with each other under the skin to form tunnel-like tracts (sinuses, fistulas).  These may drain constantly, or may temporarily improve and then usually begin draining again over time.  In people who have had sinus tracts for some time, scars form that feel like ropes under the skin. In the very worst cases, networks of sinus tracts can form deeper in the body, including the muscle and other tissues. Many people with severe HS have scars that can limit their ability to freely move their arms or legs, though this is very unlikely for most patients.     Clinicians usually classify or ???grade??? HS using the [facility] staging system according to the severity of the disease for each body location:   [place] stage I: one or more abscesses are present, but no sinus tracts have formed and no scars have developed   Doreene Adas stage II: one or more abscesses are present that resolve and recur; on sinus tract can be present and scarring is seen   Doreene Adas stage III: many abscesses and more than one sinus tract is present with extensive scars.    What causes HS?  The cause of HS is not completely understood.  It seems to be a disorder of hair follicles and often many family members are affected so genetics probably play a strong role.  Bacteria are often present and may make the disease worse, but infection does not seem to be the main cause. Hormones are also likely play a role since the condition typically starts around puberty when hair follicles under the arms and in the groin start to change.  It can sometimes flare with menstrual cycles in women as well.  In most cases it lasts for decades and starts to improve to some extent in the late 30s and 40s as long as many fistulas have not already formed.  Women are three times more likely than men to develop HS.    Other factors are known to contribute to HS flaring or becoming worse, though they are likely not the main causes. The factors most commonly associated with HS include:   Cigarette smoking - Stopping smoking will likely not cure the disease, but likely is helpful in reducing how much and how often it flares and may prevent it from getting as bad over time.   Higher weight - HS may occur even in people that are not overweight, but it is much more common in patients that are.  There is some evidence that losing weight and eating a diet low in sugars and fats may be helpful in improving hidradenitis, though this is not helpful for everyone.  Working with a nutritionist may be an important way to help with this and is something your physician can help coordinate    Hidradenitis is not contagious.  It is not caused by a problem with personal hygiene or any other activity or behavior of those with the disease.    How can your doctor help you treat your hidradenitis?  Clinicians use both medication and surgery to treat HS. The choice  of treatment--or combination of treatments--is made according to an individual patient???s needs. Clinicians consider several factors in determining the most appropriate plan for therapy:   Severity of disease - medications and some laser treatments are usually able to control disease best when fistulas are not present.  Fistulas typically require surgery.   Extent and location of disease   Chronicity (how often the lesions recur)    A number of different surgical methods have been developed that are useful for certain patients under particular circumstances. These can be done with local numbing and healing at home for some areas when disease is not too extensive with relatively brief recovery times.  In more extensive disease there may be a need for larger excisions under general anesthesia with healing time in the hospital and prolonged recovery periods for better disease control.      In addition, many medical treatments have been tried--some with more success than others. No medication is effective for all patients, and you and your doctor may have to try several different treatments or combinations of treatments before you find the treatment plan that works best for you.  The goals of therapy with medications that are either topical (used on the skin) or systemic (taken by mouth) are:  1. to clear the lesions or at least reduce their number and extent, and  2. to prevent new lesions from forming.  3. To reduce pain, drainage, and odor  Some of the types of medications commonly used are antibacterial skin washes and the topical antibiotics to prevent secondary infections and corticosteroid injections into the lesions to reduce inflammation.     Other medications that may be used include retinoids (similar to Accutane), drugs that effect how hormones and hair follicles interact, drugs that affect your immune system (such as methotrexate, adalimumab/Humira, and Remicaid/infliximab), steroids, and oral antibiotics.    Lasers that destroy hair follicles can also be helpful since they reduce the hair follicles that cause the problems.  Multiple treatments are typically required over time and there is some discomfort associated with treatment, but it is typically very fast and well-tolerated.    It is very important to realize that hidradenitis cannot usually be completely cured with any single medication or surgical procedure.  It is a disease that can be very stubborn and difficult to control, but with good treatment a lot of improvement and sometimes temporary remissions can be obtained. Poorly controlled disease can cause more fistulas to form and make managing the disease much more difficult over time so it is important to seek care to reduce major flares.  Surgery can provide a long term cure in some areas, though the disease can start again or continue in nearby areas.  A dermatologist is often the best person to help coordinate disease treatment, and sometimes other surgeons, pain specialists, other specialists, and nutritionists may be part of the treatment team.    For severe disease, the first goal is often to reduce pain and symptoms with medicines so that the disease feels more stable. Once it's stable, we often start thinking about how to address areas that have completely gotten better with surgery if they are still causing problems.    What can you do to help your HS?  1. Stopping smoking is hard and may not fix everything, but it may be a step in the right direction.  We or your primary care physician can provide resources to help stop if you are interested.  2. Follow a  healthy diet and try to achieve a healthy weight.  Some other self-help measures are:   Keep your skin cool and dry (becoming overheated and sweating can contribute to an HS flare)   To reduce the pain of cysts or nodules or to help them to drain, apply hot compresses or soak in hot water for 10 minutes at a time (use a clean washcloth or a teabag soaked in hot water)   For female patients, cotton underwear that does not have tight elastic in the groin can be helpful.  Boyshort, brief, or boxer style underwear may be a better option as friction on hair follicles in affected areas can be a major trigger in some patients.  These can be easily found on [place] or with some retailers.  Fruit of [employer] Loom and Underworks are two brands that are sometimes recommended.    Finally, know that you are not alone. Coping with the pain and other symptoms of HS can be very difficult, so it may be helpful to connect with others who live with HS. Patient groups and networks can be sources of important information and support. Some internet resources for information and connections are provided below.    Psychologytoday.com is a resource to find psychologists and therapists that can help support you in your are     [email or web address] can help connect with sexual health resources and counselors    Resources for Information    The Hidradenitis Suppurativa Foundation: A nonprofit organized by a group of physicians interested in treating and advancing research in hidradenitis suppurativa.  This group advocates for better care and research for hidradenitis and has educational materials put together specifically for patients that have been reviewed and produced by doctors and people with hidradenitis.    American Academy of Dermatology  ARanked.fi    [employer]  ElevatorPitchers.de.html  [employer]: [employer]  https://www.rarediseases.org/rare-disease-information/rare-diseases/byID/358/viewAbstract  Trials of new medications for HS  Https://www.clinicaltrials.gov

## 2022-08-02 NOTE — Unmapped (Signed)
Dermatology Note     Assessment and Plan:      Hidradenitis Suppurativa [place] Stage III, currently flaring with severe progression leading to risk to physical function with disability  - Previously failed: Humira, Ertapenem X 12 week course completed 06/2021 (complication of PE requiring hospitalization - avoiding PICC lines in future due to this), Spironolactone (non-Derm provider discontinued due to risk of low blood pressure with other medication), augmentin (chest pain)  - Previous surgeries: unroofing left axilla 06/2021, L & R inguinal crease with Dr. Orvan Falconer 2024  - Locations: axillae (R>L), inframammary, infra-pannus, groin, inner thighs, chest  - We discussed the typical natural history, pathogenesis, treatment options, and expected course as well as the relapsing and sometimes recalcitrant nature of the disease.    - Patent reports bimekizumab being denied and has only continued on cosentyx 300 mg q28 days. Per referral notes, patient cannot use medication in combination with another biologic and must have failed 3 alternatives. Will follow up with [facility] on appeal for this.   - Discussed treatment options of switching from cosentyx to bimekizumab vs increasing frequency of cosentyx from q4wks to q2wks vs prednisone taper (which would worsen psoriasis but may be needed for short flares) vs switching abx therapy  - Based on discussion above and R/B/A reviewed, jointly elected to proceed with plan per below:  - Start cefdinir 300 mg BID; reviewed potential cross-reactivity with penicillin class  - Continue cosenytx 300 mg q28 days until bimekizumab is approved  - Continue Infliximab 10mg /kg every 4 weeks.   - Stop clindamycin 300 mg BID  - Patient agreed to ILK today per procedure note below  - Continue following with [place] [facility] for ongoing chronic pain management.  - Will plan to return to Dr. Orvan Falconer for consideration of unroofing of bilateral axillae in the future    Intralesional Kenalog Procedure Note: After the patient was informed of risks (including atrophy and dyspigmentation), benefits and side effects of intralesional steroid injection, the patient elected to undergo injection and verbal consent was obtained. Skin was cleaned with alcohol and injected intralesionally into the sites (below). The patient tolerated the procedure well without complications and was instructed on post-procedure care.  Location(s): L inguinal x 3, R inguinal fold x 3  Number of sites treated: 6  Kenalog (triamcinolone) Concentration: 40 mg/ml   Volume: 1 ml total    Intertrigo of groin  - Diagnosis, treatment options, prognosis, risk/ benefit, and side effects of treatment were discussed with the patient.   - Creams burn with application; will avoid ketoconazole at this time and trial nystatin  - Start nystatin (MYCOSTATIN) 100,000 unit/gram ointment; Apply topically two (2) times a day. 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 Janyth Contes  - Switching from cosentyx to bimekizumab as above  - Continue triamcinolone 0.1% ointment. Apply to inflamed areas of HS twice a day until improved, no longer than 2 weeks at a time. Then stop for two weeks. Restart as needed. Refilled today. Do not use until intertrigo (as below) is improved as this can worsen rash    High risk medication use - infliximab, cosentyx, -->plan to start bimekizumab   - Last Angela Burke 10/2021 negative   - Monitoring labs q4 weeks w/ infusions; 07/2022 labs reviewed, acceptable to continue  - Infliximab level/ antibody 8.3 07/09/2022 - acceptable to continue      The patient was advised  to call for an appointment should any new, changing, or symptomatic lesions develop.     RTC: Return in about 3 months (around 11/02/2022) for follow up of psorasis. or sooner as needed   _________________________________________________________________      Chief Complaint     Chief Complaint   Patient presents with    Skin Problem     Follow up HS patient groin patient states no improvement in the area. Still taking clindamycin and remicaid but no improvement        HPI     Melissa Pearson is a 32 y.o. female who presents as a returning patient (last seen by Dr. Alona Bene and Dr. Janyth Contes on 05/15/2022) to Dermatology for follow up of HS. At last visit, patient was to continue infliximab, start bimekizumab 320 mg q8wks, start augmentin 875-125 mg BID, continue norco 5-325 mg q6 hrs, and continue triamcinolone 0.1% ointment for her hidradenitis suppurativa. History jointly provided with Mother.    HS:  - Reports that flares have worsened since last visit. Flares located on the groin    - Endorses tenderness, drainage, and pruritus  - Currently doing infliximab infusions every week without relief. Notes next infusion next week.   - Also doing cosentyx injections every month without improvement. Patient reports that she never started bimekizumab since it was denied by insurance.  - Patient takes clindamycin twice daily without improvement  - She also takes hydroxyzine nightly to relieve pruritus in order to sleep   - Mother applies vaseline to patient's groin frequently to relieve pruritus  - Patient has been on birth control since she was 69 and doesn't have a period    The patient denies any other new or changing lesions or areas of concern.     Pertinent Past Medical History     No history of skin cancer    Problem List          Musculoskeletal and Integument    Hidradenitis suppurativa - Primary    Relevant Medications    cefdinir (OMNICEF) 300 MG capsule    triamcinolone (KENALOG) 0.1 % ointment    triamcinolone acetonide (KENALOG-40) injection 40 mg (Start on 08/02/2022 10:00 AM)     Family History:   Negative for melanoma    Past Medical History, Family History, Social History, Medication List, Allergies, and Problem List were reviewed in the rooming section of Epic.     ROS: Other than symptoms mentioned in the HPI, no fevers, chills, or other skin complaints    Physical Examination     GENERAL: Well-appearing female in no acute distress, resting comfortably.  NEURO: Alert and oriented, answers questions appropriately  PSYCH: Normal mood and affect  SKIN: Examination of the bilateral axilla, chest, abdomen, buttocks, and groin was performed  - erythema, maceration in bilateral inguinal folds with resolution of prior fissures   - scaling and erythematous patches on bilateral medial thighs and inguinal folds  - multiple inflamed nodules and draining sinus tracts on the axillae (R>L), intermammary, inguinal folds, medial thighs  Pilonidal sinus? no    All areas not commented on are within normal limits or unremarkable    Scribe's Attestation: Gentry Fitz Robynn Pane) Natasha Bence, PA-C obtained and performed the history, physical exam and medical decision making elements that were entered into the chart. Signed by Donnella Sham, Scribe, on Aug 02, 2022 at 9:12 AM.    ----------------------------------------------------------------------------------------------------------------------  Aug 02, 2022 10:16 AM. Documentation assistance provided by the Scribe. I was present during  the time the encounter was recorded. The information recorded by the Scribe was done at my direction and has been reviewed and validated by me.  ----------------------------------------------------------------------------------------------------------------------      (Approved Template 11/16/2019)

## 2022-08-06 ENCOUNTER — Ambulatory Visit: Admit: 2022-08-06 | Discharge: 2022-08-07 | Payer: PRIVATE HEALTH INSURANCE

## 2022-08-06 DIAGNOSIS — L732 Hidradenitis suppurativa: Secondary | ICD-10-CM | POA: Diagnosis not present

## 2022-08-06 MED ADMIN — acetaminophen (TYLENOL) tablet 650 mg: 650 mg | ORAL | @ 15:00:00 | Stop: 2022-08-06

## 2022-08-06 MED ADMIN — diphenhydrAMINE (BENADRYL) capsule/tablet 25 mg: 25 mg | ORAL | @ 15:00:00 | Stop: 2022-08-06

## 2022-08-06 MED ADMIN — cetirizine (ZYRTEC) tablet 10 mg: 10 mg | ORAL | @ 15:00:00 | Stop: 2022-08-06

## 2022-08-06 MED ADMIN — inFLIXimab-axxq (AVSOLA) 10 mg/kg = 900 mg in sodium chloride (NS) 250 mL IVPB: 10 mg/kg | INTRAVENOUS | @ 16:00:00 | Stop: 2022-08-06

## 2022-08-06 NOTE — Unmapped (Signed)
Patient presents for standard Avsola (infliximab-axxq) infusion.  In no acute distress, VSS.  Reports no new medical issues or S/S of infection.  PIV placed in left hand secured with Tegaderm, blood return visualized. Patient aware of potential reactions/side effects, call bell within reach. See MAR for premeds.         1151 Avsola (infliximab-axxq) 900 mg infusing as follows:  65ml/hr x 15 min  91ml/hr x 15 min  32ml/hr x 15 min  45ml/hr x 15 min  170ml/hr x 30 min  256ml/hr for the remainder of the infusion.     1351 Avsola (infliximab-axxq) infusion complete.       Infusion tolerated well, no s/s of adverse reaction, VSS. PIV flushed per protocol then d/c'd, no bleeding, bruising or swelling noted, gauze and coban applied. Patient discharged from [facility]fusion [facility] in stable condition.

## 2022-08-14 DIAGNOSIS — H1820 Unspecified corneal edema: Secondary | ICD-10-CM | POA: Diagnosis not present

## 2022-08-14 DIAGNOSIS — H16143 Punctate keratitis, bilateral: Secondary | ICD-10-CM | POA: Diagnosis not present

## 2022-08-17 ENCOUNTER — Encounter: Payer: BC Managed Care – PPO | Admitting: Nurse Practitioner

## 2022-08-20 DIAGNOSIS — L408 Other psoriasis: Principal | ICD-10-CM

## 2022-08-21 ENCOUNTER — Ambulatory Visit
Admission: RE | Admit: 2022-08-21 | Discharge: 2022-08-21 | Disposition: A | Discharge status: Home / Self Care | Payer: BC Managed Care – PPO | Source: Ambulatory Visit

## 2022-08-21 VITALS — BP 139/72 | HR 76 | Temp 98.2°F | Resp 16

## 2022-08-21 DIAGNOSIS — L732 Hidradenitis suppurativa: Principal | ICD-10-CM

## 2022-08-21 DIAGNOSIS — R1011 Right upper quadrant pain: Secondary | ICD-10-CM | POA: Diagnosis not present

## 2022-08-21 DIAGNOSIS — R112 Nausea with vomiting, unspecified: Secondary | ICD-10-CM

## 2022-08-21 DIAGNOSIS — R197 Diarrhea, unspecified: Secondary | ICD-10-CM | POA: Diagnosis not present

## 2022-08-21 LAB — POCT URINALYSIS DIP (MANUAL ENTRY)
Bilirubin, UA: NEGATIVE
Glucose, UA: NEGATIVE mg/dL
Ketones, POC UA: NEGATIVE mg/dL
Nitrite, UA: NEGATIVE
Protein Ur, POC: NEGATIVE mg/dL
Spec Grav, UA: 1.015 (ref 1.010–1.025)
Urobilinogen, UA: 0.2 E.U./dL
pH, UA: 7 (ref 5.0–8.0)

## 2022-08-21 LAB — POCT URINE PREGNANCY: Preg Test, Ur: NEGATIVE

## 2022-08-21 MED ORDER — ONDANSETRON 4 MG PO TBDP
4.0000 mg | ORAL_TABLET | Freq: Once | ORAL | Status: AC
  Administered 2022-08-21: 4 mg via ORAL

## 2022-08-21 MED ORDER — ONDANSETRON HCL 4 MG PO TABS: 4.0000 mg | ORAL_TABLET | Freq: Three times a day (TID) | ORAL | 0 refills | Status: DC | PRN

## 2022-08-21 MED ORDER — DOXYCYCLINE HYCLATE 100 MG TABLET
ORAL_TABLET | Freq: Two times a day (BID) | ORAL | 0 refills | 30.00000 days | Status: CP
Start: 2022-08-21 — End: ?

## 2022-08-21 NOTE — ED Triage Notes (Signed)
Pt states vomiting and diarrhea  since last night after eating chicken wings also having RUQ abd pain.

## 2022-08-21 NOTE — ED Provider Notes (Signed)
[facility]    CSN: [id number] Arrival date & time: 08/21/22  1724      History   Chief Complaint Chief Complaint  Patient presents with   Nausea    Entered by patient    HPI Courtney [name] is a 32 y.o. female.   The history is provided by the patient and a parent.   The patient presents for complaints of nausea, vomiting, and diarrhea that started last evening after eating chicken wings.  Patient also has right upper quadrant abdominal pain.  Patient reports right upper quadrant abdominal pain has been present for "months".  She states she has a HIDA scan that needs to be scheduled.  With regard to vomiting.  Patient states her last episode was around 1 or 2 PM today, she notes 3-4 episodes of vomiting since symptoms started.  She also reports 2 episodes of diarrhea.  Patient denies fever, chills, chest pain, constipation, or urinary symptoms.  Patient states that she has not had a regular period since the age of 77.  Patient states that she has been able to eat and drink since the vomiting episode, and she has been able to keep the food down.  She denies any change in the right upper quadrant abdominal pain that she has been experiencing for months.  The patient's mother states "they have already checked her appendix and her gallbladder, and cannot find anything."  Past Medical History:  Diagnosis Date   ADHD (attention deficit hyperactivity disorder)    Bipolar disorder (HCC)    Chronic tonsillitis 08/2013   current strep, will finish antibiotic 09/04/2013; snores during sleep, mother denies apnea   Heart murmur    "when I was born; outgrew it; I was a preemie"   Hidradenitis    Hidradenitis 2017   Migraine 12/19/2016   "I've just had this one for 3 days" (12/20/2016)   Septic shock (HCC) 07/02/2021    Patient Active Problem List   Diagnosis Date Noted   Otitis externa of both ears 05/16/2022   Chronic rhinitis 02/15/2022   Shellfish allergy 02/15/2022    Pulmonary embolism (HCC) 07/19/2021   Hypotension 07/11/2021   Trichomonas vaginitis 07/05/2021   Acute metabolic encephalopathy 07/05/2021   Bilateral pulmonary embolism (HCC) 07/05/2021   Anxiety disorder 06/28/2021   Prediabetes 05/25/2021   Allergy to adhesive tape 05/25/2021   Hidradenitis suppurativa 05/23/2021   Insomnia 03/27/2021   Anemia 12/21/2020   Hypoglycemia 11/17/2020   Bipolar disorder, in full remission, most recent episode mixed (HCC) 10/07/2020   Bipolar disorder, in full remission, most recent episode depressed (HCC) 03/03/2020   Bipolar 2 disorder, major depressive episode (HCC) 08/04/2019   At risk for long QT syndrome 08/04/2019   Obesity (BMI 35.0-39.9 without comorbidity) 03/19/2019   Reactive airway disease 02/11/2019   Conductive hearing loss 03/13/2018   Axillary hidradenitis suppurativa 04/03/2017   Vitamin D deficiency 10/18/2014   Attention deficit hyperactivity disorder (ADHD), predominantly hyperactive type 02/11/2012    Past Surgical History:  Procedure Laterality Date   AXILLARY HIDRADENITIS EXCISION     AXILLARY LYMPH NODE BIOPSY Left 01/06/2019   CYSTOSCOPY W/ URETERAL STENT PLACEMENT Right 05/28/2017   Procedure: CYSTOSCOPY WITH RETROGRADE PYELOGRAM/ RIGHT URETERAL STENT PLACEMENT;  Surgeon: Crist Fat, MD;  Location: [facility];  Service: Urology;  Laterality: Right;   HYDRADENITIS EXCISION Bilateral 04/03/2017   Procedure: EXCISION AND DRAINAGE BILATERAL  HIDRADENITIS AXILLA;  Surgeon: Griselda Miner, MD;  Location: [facility] OR;  Service: General;  Laterality: Bilateral;   INCISION AND DRAINAGE ABSCESS Right 07/20/2016   Procedure: INCISION AND DRAINAGE ABSCESS right axilla;  Surgeon: Abigail Miyamoto, MD;  Location: [facility] OR;  Service: General;  Laterality: Right;   INTRAUTERINE DEVICE (IUD) INSERTION  11/2016   "had the one in my left arm removed"   IRRIGATION AND DEBRIDEMENT ABSCESS Right 12/21/2016   Procedure: IRRIGATION AND DEBRIDEMENT  RIGHT AXILLARY HIDRADENITIS;  Surgeon: Andria Meuse, MD;  Location: [facility] OR;  Service: General;  Laterality: Right;   IRRIGATION AND DEBRIDEMENT ABSCESS Left 01/08/2017   Procedure: IRRIGATION AND DEBRIDEMENT AXILLARY ABSCESS;  Surgeon: Griselda Miner, MD;  Location: [facility] OR;  Service: General;  Laterality: Left;   NASAL SEPTOPLASTY W/ TURBINOPLASTY Bilateral 02/25/2019   Procedure: NASAL SEPTOPLASTY WITH TURBINATE REDUCTION;  Surgeon: Serena Colonel, MD;  Location: [facility];  Service: ENT;  Laterality: Bilateral;   TONSILLECTOMY Bilateral 09/14/2013   Procedure: BILATERAL TONSILLECTOMY;  Surgeon: Serena Colonel, MD;  Location: [facility];  Service: ENT;  Laterality: Bilateral;   TYMPANOPLASTY Bilateral    "rebuilt eardrums"   WISDOM TOOTH EXTRACTION      OB History   No obstetric history on file.      Home Medications    Prior to Admission medications   Medication Sig Start Date End Date Taking? Authorizing Provider  ondansetron (ZOFRAN) 4 MG tablet Take 1 tablet (4 mg total) by mouth every 8 (eight) hours as needed for nausea or vomiting. 08/21/22  Yes Khyler Urda-Warren, Sadie Haber, NP  albuterol (VENTOLIN HFA) 108 (90 Base) MCG/ACT inhaler TAKE 2 PUFFS BY MOUTH EVERY 6 HOURS AS NEEDED FOR WHEEZE OR SHORTNESS OF BREATH 07/05/22   Eden Emms, NP  ARIPiprazole (ABILIFY) 2 MG tablet Take by mouth.    [provider]  benzonatate (TESSALON) 100 MG capsule Take 1-2 tablets 3 times a day as needed for cough 02/28/22   Immordino, Stephen, FNP  bifidobacterium infantis (ALIGN) capsule Take 1 capsule by mouth daily.    [provider]  busPIRone (BUSPAR) 10 MG tablet TAKE 1 TABLET BY MOUTH TWICE A DAY 02/06/22   Jomarie Longs, MD  clindamycin (CLEOCIN) 300 MG capsule Take 300 mg by mouth 2 (two) times daily. 11/09/21   [provider]  COSENTYX SENSOREADY, 300 MG, 150 MG/ML SOAJ Inject 2 Syringes into the skin every 28 (twenty-eight) days.  10/30/21   [provider]  diphenhydrAMINE (BENADRYL) 25 mg capsule 25 mg every 6 (six) hours as needed.    [provider]  EPINEPHrine (EPIPEN 2-PAK) 0.3 mg/0.3 mL IJ SOAJ injection Inject 0.3 mg into the muscle as needed for anaphylaxis. 02/15/22   Eden Emms, NP  glucose blood (CONTOUR TEST) test strip And lancets #100 05/18/21   Romero Belling, MD  HYDROcodone-acetaminophen (NORCO) 5-325 MG tablet Take 2 tablets by mouth every 6 (six) hours as needed for severe pain. 06/27/22   Molpus, John, MD  hydrOXYzine (ATARAX) 25 MG tablet TAKE 1 TABLET BY MOUTH EVERY 8 HOURS AS NEEDED FOR ITCHING. 01/16/22   Worthy Rancher B, FNP  inFLIXimab (REMICADE IV) Inject 1 Dose into the vein every 30 (thirty) days.    [provider]  Iron-Vitamin C 100-250 MG TABS Take 1 tablet by mouth daily.    [provider]  lamoTRIgine (LAMICTAL) 100 MG tablet TAKE 0.5 TABLETS BY MOUTH 2 TIMES DAILY. 05/24/22   Jomarie Longs, MD  methylphenidate (RITALIN) 20 MG tablet Take 0.5 tablets (10 mg total) by  mouth daily as needed. 02/15/22   Eden Emms, NP  methylphenidate (RITALIN) 20 MG tablet Take 1 tablet (20 mg total) by mouth daily before breakfast. 05/16/22 06/15/22  Eden Emms, NP  methylphenidate (RITALIN) 20 MG tablet Take 1 tablet (20 mg total) by mouth daily before breakfast. NEED OFFICE VISIT FOR FURTHER REFILLS 05/16/22 06/15/22  Eden Emms, NP  methylphenidate (RITALIN) 20 MG tablet Take 1 tablet (20 mg total) by mouth daily before breakfast. 05/16/22   Eden Emms, NP  Microlet Lancets MISC See admin instructions. 05/18/21   [provider]  montelukast (SINGULAIR) 10 MG tablet TAKE 1 TABLET BY MOUTH EVERYDAY AT BEDTIME 05/16/22   Eden Emms, NP  norethindrone (MICRONOR) 0.35 MG tablet Take 1 tablet by mouth daily. 07/19/21   [provider]  ondansetron (ZOFRAN-ODT) 8 MG disintegrating tablet Take 1 tablet (8 mg total) by mouth every 8 (eight) hours  as needed for vomiting or nausea. 06/27/22   Molpus, John, MD  traZODone (DESYREL) 50 MG tablet TAKE 1 TABLET BY MOUTH EVERYDAY AT BEDTIME 11/10/21   Gweneth Dimitri, MD    Family History Family History  Problem Relation Age of Onset   Hypertension Mother    Hyperlipidemia Mother    Diabetes Mother    Hyperlipidemia Father    Heart attack Father 28   Alcohol abuse Paternal Uncle    Parkinson's disease Maternal Grandmother    Dementia Maternal Grandmother    Diabetes Maternal Grandmother    Bell's palsy Maternal Grandmother     Social History Social History   Tobacco Use   Smoking status: Never   Smokeless tobacco: Never  Vaping Use   Vaping Use: Former   Quit date: 07/03/2021   Substances: CBD  Substance Use Topics   Alcohol use: Not Currently    Comment: once a month   Drug use: Not Currently    Types: Marijuana     Allergies   Other, Shellfish allergy, Shellfish-derived products, Tapentadol, Tape, Ginger, Morphine and codeine, and Vancomycin   Review of Systems Review of Systems Per HPI  Physical Exam Triage Vital Signs ED Triage Vitals  Enc Vitals Group     BP 08/21/22 1754 139/72     Pulse Rate 08/21/22 1754 76     Resp 08/21/22 1754 16     Temp 08/21/22 1754 98.2 F (36.8 C)     Temp Source 08/21/22 1754 Oral     SpO2 08/21/22 1754 98 %     Weight --      Height --      Head Circumference --      Peak Flow --      Pain Score 08/21/22 1755 10     Pain Loc --      Pain Edu? --      Excl. in GC? --    No data found.  Updated Vital Signs BP 139/72 (BP Location: Right Arm)   Pulse 76   Temp 98.2 F (36.8 C) (Oral)   Resp 16   LMP  (LMP Unknown)   SpO2 98%   Visual Acuity Right Eye Distance:   Left Eye Distance:   Bilateral Distance:    Right Eye Near:   Left Eye Near:    Bilateral Near:     Physical Exam Vitals and nursing note reviewed.  Constitutional:      General: She is not in acute distress.    Appearance: Normal appearance.   HENT:  Head: Normocephalic.  Eyes:     Extraocular Movements: Extraocular movements intact.     Pupils: Pupils are equal, round, and reactive to light.  Cardiovascular:     Rate and Rhythm: Normal rate and regular rhythm.     Pulses: Normal pulses.     Heart sounds: Normal heart sounds.  Pulmonary:     Effort: Pulmonary effort is normal. No respiratory distress.     Breath sounds: Normal breath sounds. No stridor. No wheezing, rhonchi or rales.  Abdominal:     General: Bowel sounds are normal. There is no distension.     Palpations: Abdomen is soft.     Tenderness: There is abdominal tenderness in the right upper quadrant. There is no right CVA tenderness or left CVA tenderness.  Musculoskeletal:     Cervical back: Normal range of motion.  Skin:    General: Skin is warm and dry.  Neurological:     General: No focal deficit present.     Mental Status: She is alert and oriented to person, place, and time.  Psychiatric:        Mood and Affect: Mood normal.        Behavior: Behavior normal.      UC Treatments / Results  Labs (all labs ordered are listed, but only abnormal results are displayed) Labs Reviewed  POCT URINALYSIS DIP (MANUAL ENTRY) - Abnormal; Notable for the following components:      Result Value   Blood, UA trace-intact (*)    Leukocytes, UA Trace (*)    All other components within normal limits  POCT URINE PREGNANCY    EKG   Radiology No results found.  Procedures Procedures (including critical care time)  Medications Ordered in UC Medications  ondansetron (ZOFRAN-ODT) disintegrating tablet 4 mg (4 mg Oral Given 08/21/22 1810)    Initial Impression / Assessment and Plan / UC Course  I have reviewed the triage vital signs and the nursing notes.  Pertinent labs & imaging results that were available during my care of the patient were reviewed by me and considered in my medical decision making (see chart for details).  The patient is  well-appearing, she is in no acute distress, vital signs are stable.    Urinalysis does not indicate an obvious infection, urine culture is pending.  Urine pregnancy test is also negative.  Zofran 4 mg ODT was administered in the clinic.  Suspect possible gastroenteritis as a result of the chicken wings that she had.  Patient has been afebrile, has had no change in the right upper quadrant tenderness, and is well-appearing, no obvious indication of acute abdomen at this time.  Will treat nausea and vomiting with Zofran 4 mg ODT.  Supportive care recommendations were provided and discussed with the patient to include over-the-counter Tylenol or ibuprofen for pain, fever, general discomfort, a brat diet, and increasing the fiber in her diet to help with diarrhea.  Patient was advised to drink Pedialyte or Gatorade like to help with rehydration.  Patient was also given strict ER follow-up precautions.  Patient is in agreement with this plan of care and verbalizes understanding.  All questions were answered.  Patient stable for discharge.  Work note was provided.   Final Clinical Impressions(s) / UC Diagnoses   Final diagnoses:  Nausea vomiting and diarrhea  Right upper quadrant abdominal pain     Discharge Instructions      Take medication as prescribed. May take over-the-counter Tylenol or ibuprofen as needed for  pain, fever, general discomfort. Increase your fluid intake.  Try to drink at least 8-10 8 ounce glasses of water while symptoms persist.  You can also drink Pedialyte or Gatorolyte to help with rehydration. Recommend a brat diet until nausea improves.  This includes bananas, rice, applesauce, and toast.  For your diarrhea, you should eat foods that are high in fiber.  I have provided information for you to refer to. If you develop worsening abdominal pain, nausea or vomiting that will not stop with the medication prescribed today, fever, chills, or other concerns, please go to the  emergency department immediately for further evaluation. As discussed, please reach out to your insurance company to determine if you need a referral to see gastroenterology. Follow-up as needed.     ED Prescriptions     Medication Sig Dispense Auth. Provider   ondansetron (ZOFRAN) 4 MG tablet Take 1 tablet (4 mg total) by mouth every 8 (eight) hours as needed for nausea or vomiting. 20 tablet Elaria Osias-Warren, Sadie Haber, NP      PDMP not reviewed this encounter.   Abran Cantor, NP 08/21/22 [phone]

## 2022-08-21 NOTE — Discharge Instructions (Addendum)
Take medication as prescribed. May take over-the-counter Tylenol or ibuprofen as needed for pain, fever, general discomfort. Increase your fluid intake.  Try to drink at least 8-10 8 ounce glasses of water while symptoms persist.  You can also drink Pedialyte or Gatorolyte to help with rehydration. Recommend a brat diet until nausea improves.  This includes bananas, rice, applesauce, and toast.  For your diarrhea, you should eat foods that are high in fiber.  I have provided information for you to refer to. If you develop worsening abdominal pain, nausea or vomiting that will not stop with the medication prescribed today, fever, chills, or other concerns, please go to the emergency department immediately for further evaluation. As discussed, please reach out to your insurance company to determine if you need a referral to see gastroenterology. Follow-up as needed.

## 2022-08-24 DIAGNOSIS — L408 Other psoriasis: Principal | ICD-10-CM

## 2022-08-24 MED ORDER — COSENTYX PEN 300 MG/2 PENS (150 MG/ML) SUBCUTANEOUS
SUBCUTANEOUS | 11 refills | 28 days
Start: 2022-08-24 — End: ?

## 2022-08-27 DIAGNOSIS — L408 Other psoriasis: Principal | ICD-10-CM

## 2022-08-27 MED ORDER — COSENTYX PEN 300 MG/2 PENS (150 MG/ML) SUBCUTANEOUS
SUBCUTANEOUS | 11 refills | 28 days
Start: 2022-08-27 — End: ?

## 2022-08-27 NOTE — Unmapped (Signed)
[facility] Refill Coordination Note    Rihanna Marseille, [place]: March 01, 1991  Phone: [phone] (home) [phone] (work)      All above HIPAA information was verified with patient.         08/25/2022    11:32 PM   Specialty Rx Medication Refill Questionnaire   Which Medications would you like refilled and shipped? Cosentyx   Please list all current allergies: Morphine   Have you missed any doses in the last 30 days? No   Have you had any changes to your medication(s) since your last refill? Yes   Please list your medication(s) changes below. Doxycyline   How many days remaining of each medication do you have at home? 0   If receiving an injectable medication, next injection date is 08/31/2022   Have you experienced any side effects in the last 30 days? No   Please enter the full address (street address, city, state, zip code) where you would like your medication(s) to be delivered to. [address], [place] [address]   Please specify on which day you would like your medication(s) to arrive. Note: if you need your medication(s) within 3 days, please call the pharmacy to schedule your order at [phone]  08/29/2022   Has your insurance changed since your last refill? No   Would you like a pharmacist to call you to discuss your medication(s)? No   Do you require a signature for your package? (Note: if we are billing Medicare Part B or your order contains a controlled substance, we will require a signature) No         Completed refill call assessment today to schedule patient's medication shipment from the [facility] [phone]).  All relevant notes have been reviewed.       Confirmed patient received a Conservation officer, historic buildings and a Surveyor, mining with first shipment. The patient will receive a drug information handout for each medication shipped and additional FDA Medication Guides as required.         REFERRAL TO PHARMACIST     Referral to the pharmacist: Not needed      [facility]     Shipping address confirmed in Epic.     Delivery Scheduled: Yes, Expected medication delivery date: 6/26.  However, Rx request for refills was sent to the provider as there are none remaining.     Medication will be delivered via UPS to the prescription address in Epic W[facility].    [name]   [facility] Specialty Technician

## 2022-08-28 NOTE — Unmapped (Signed)
Melissa Pearson Else 's COSENTYX PEN (2 PENS) 150 mg/mL Pnij injection (secukinumab) shipment will be delayed as a result of no refills remain on the prescription.      I have reached out to the patient  at [phone] and left a voicemail message.  We will call the patient back to reschedule the delivery upon resolution. We have not confirmed the new delivery date.

## 2022-08-30 ENCOUNTER — Telehealth: Payer: Self-pay | Admitting: Nurse Practitioner

## 2022-08-30 NOTE — Telephone Encounter (Signed)
Can we call the pharmacy and see if there are scripts on file. I sent in 3 and it looks like only two have been filled

## 2022-08-30 NOTE — Telephone Encounter (Signed)
Prescription Request  08/30/2022  LOV: 05/16/2022  What is the name of the medication or equipment?  methylphenidate (RITALIN) 20 MG tablet  Have you contacted your pharmacy to request a refill? Yes  Yes Which pharmacy would you like this sent to?  [id number] [place], [place] - [address] [place] [address] Phone: [phone] Fax: [phone]     Patient notified that their request is being sent to the clinical staff for review and that they should receive a response within 2 business days.   Please advise at Mobile [phone] (mobile)

## 2022-08-30 NOTE — Telephone Encounter (Signed)
Called pharmacy. They have two prescriptions on file.  Phone call to patient to make her aware that the pharmacy is filling medication.

## 2022-08-30 NOTE — Telephone Encounter (Signed)
Last OV was 05/16/22 Next OV is 09/21/22  Last fill is 05/16/22 #30/0

## 2022-09-03 ENCOUNTER — Ambulatory Visit: Admit: 2022-09-03 | Discharge: 2022-09-04 | Payer: PRIVATE HEALTH INSURANCE

## 2022-09-03 DIAGNOSIS — L732 Hidradenitis suppurativa: Secondary | ICD-10-CM | POA: Diagnosis not present

## 2022-09-03 LAB — AST: AST (SGOT): 16 U/L (ref <=34)

## 2022-09-03 LAB — CBC W/ AUTO DIFF
BASOPHILS ABSOLUTE COUNT: 0 10*9/L (ref 0.0–0.1)
BASOPHILS RELATIVE PERCENT: 0.1 %
EOSINOPHILS ABSOLUTE COUNT: 0.4 10*9/L (ref 0.0–0.5)
EOSINOPHILS RELATIVE PERCENT: 3.3 %
HEMATOCRIT: 37.8 % (ref 34.0–44.0)
HEMOGLOBIN: 12.2 g/dL (ref 11.3–14.9)
LYMPHOCYTES ABSOLUTE COUNT: 5 10*9/L — ABNORMAL HIGH (ref 1.1–3.6)
LYMPHOCYTES RELATIVE PERCENT: 38.7 %
MEAN CORPUSCULAR HEMOGLOBIN CONC: 32.3 g/dL (ref 32.0–36.0)
MEAN CORPUSCULAR HEMOGLOBIN: 28.1 pg (ref 25.9–32.4)
MEAN CORPUSCULAR VOLUME: 86.8 fL (ref 77.6–95.7)
MEAN PLATELET VOLUME: 7.4 fL (ref 6.8–10.7)
MONOCYTES ABSOLUTE COUNT: 0.7 10*9/L (ref 0.3–0.8)
MONOCYTES RELATIVE PERCENT: 5.7 %
NEUTROPHILS ABSOLUTE COUNT: 6.7 10*9/L (ref 1.8–7.8)
NEUTROPHILS RELATIVE PERCENT: 52.2 %
PLATELET COUNT: 345 10*9/L (ref 150–450)
RED BLOOD CELL COUNT: 4.35 10*12/L (ref 3.95–5.13)
RED CELL DISTRIBUTION WIDTH: 16.3 % — ABNORMAL HIGH (ref 12.2–15.2)
WBC ADJUSTED: 12.8 10*9/L — ABNORMAL HIGH (ref 3.6–11.2)

## 2022-09-03 LAB — BUN: BLOOD UREA NITROGEN: 8 mg/dL — ABNORMAL LOW (ref 9–23)

## 2022-09-03 LAB — SEDIMENTATION RATE: ERYTHROCYTE SEDIMENTATION RATE: 112 mm/h — ABNORMAL HIGH (ref 0–20)

## 2022-09-03 LAB — C-REACTIVE PROTEIN: C-REACTIVE PROTEIN: 23 mg/L — ABNORMAL HIGH (ref <=10.0)

## 2022-09-03 LAB — CREATININE
CREATININE: 0.71 mg/dL
EGFR CKD-EPI (2021) FEMALE: >90 mL/min/{1.73_m2} (ref >=60)

## 2022-09-03 LAB — ALT: ALT (SGPT): 17 U/L (ref 10–49)

## 2022-09-03 MED ADMIN — acetaminophen (TYLENOL) tablet 650 mg: 650 mg | ORAL | @ 15:00:00 | Stop: 2022-09-03

## 2022-09-03 MED ADMIN — inFLIXimab-axxq (AVSOLA) 10 mg/kg = 900 mg in sodium chloride (NS) 250 mL IVPB: 10 mg/kg | INTRAVENOUS | @ 16:00:00 | Stop: 2022-09-03

## 2022-09-03 MED ADMIN — cetirizine (ZYRTEC) tablet 10 mg: 10 mg | ORAL | @ 15:00:00 | Stop: 2022-09-03

## 2022-09-03 MED ADMIN — diphenhydrAMINE (BENADRYL) capsule/tablet 25 mg: 25 mg | ORAL | @ 15:00:00 | Stop: 2022-09-03

## 2022-09-03 NOTE — Unmapped (Signed)
Patient presents for standard Avsola (infliximab-axxq) infusion.  In no acute distress, VSS.  Reports no new medical issues or S/S of infection.  PIV placed in left hand secured with Tegaderm, blood return visualized. Patient aware of potential reactions/side effects, call bell within reach. See MAR for premeds.            Avsola (infliximab-axxq) 900 mg infusing as follows:  29ml/hr x 15 min  45ml/hr x 15 min  55ml/hr x 15 min  43ml/hr x 15 min  125ml/hr x 30 min  252ml/hr for the remainder of the infusion.      Avsola (infliximab-axxq) infusion complete.       Infusion tolerated well, no s/s of adverse reaction, VSS. PIV flushed per protocol then d/c'd, no bleeding, bruising or swelling noted, gauze and coban applied. Patient discharged from Infusion Center in stable condition.

## 2022-09-07 NOTE — Unmapped (Addendum)
Switching from Cosentyx to [facility] AUTOINJECTOR (bimekizumab)  -Inject the contents of 2 syringes (320 mg total)  under the skin every twenty-eight (28) days. (for first 16 weeks)     [facility]   Patient Onboarding/Medication Counseling    Melissa Pearson is a 32 y.o. female with HS who I am counseling today on initiation of therapy.  I am speaking to the patient.    Was a Nurse, learning disability used for this call? No    Verified patient's date of birth / HIPAA.    Specialty medication(s) to be sent: Inflammatory Disorders: Bimzelx      Non-specialty medications/supplies to be sent: No      Medications not needed at this time: N/A       Bimzelx (bimekizumab-bkzx)    Medication & Administration     Dosage: Hidradenitis Suppurativa and Inverse psoriasis: Inject the contents of 2 pens/syringes (320mg ) under the skin once every 4 weeks for the first 16 weeks, and then every 8 weeks thereafter    Lab tests required prior to treatment initiation:  Tuberculosis: Tuberculosis screening resulted in a non-reactive Quantiferon TB Gold assay. 10/03/2021  LFTs: LFTs complete and WNL.    Administration: Inject under the skin of the abdomen, upper thigh or outer upper arm. Inject each pen/syringe at different sites. Rotate injection sites.    Bimzelx (bimekizumab)    Administration:     Prefilled auto-injector pen  1. Gather all supplies needed for injection on a clean, flat working surface: medication pen removed from packaging, alcohol swab, sharps container, etc.  2. Look at the medication label - look for correct medication, correct dose, and check the expiration date  3. Check the medicine through the viewing window. The medicine should be pale brownish-yellow and free of particles. You may see air bubbles in the liquid. This is normal. Do not use the pre-filled pen if the medicine is cloudy, discolored, or has particles  4. Lay the auto-injector pen on a flat surface and allow it to warm up to room temperature for at least 30-45 minutes  5. Select injection site - you can use the front of your thigh or your belly (but not the area 2 inches around your belly button); if someone else is giving you the injection you can also use your upper arm in the skin covering your triceps muscle  6. Prepare injection site - wash your hands and clean the skin at the injection site with an alcohol swab and let it air dry, do not touch the injection site again before the injection  7. Pull off the white safety cap, do not remove until immediately prior to injection and do not touch the needle guard or place cap back on. This is because it could activate the pre-filled pen and you could prick yourself  8. Put the needle guard against your skin at the injection site at a 90 degree angle, hold the pen such that you can see the clear medication window  9. Press down and hold the pen firmly against your skin, there will be a click when the injection starts  10. Continue to hold the pen firmly against your skin for about 15 seconds - the window will start to turn solid yellow  11. There will be a second click sound when the injection is almost complete, verify the window is solid yellow to indicate the injection is complete and then pull the pen away from your skin  12. Dispose of the  used auto-injector pen immediately in your sharps disposal container the needle will be covered automatically  13. If you see any blood at the injection site, press a cotton ball or gauze on the site and maintain pressure until the bleeding stops, do not rub the injection site      Adherence/Missed dose instructions:  If your injection is given more than 4 days after your scheduled injection date - consult your pharmacist for additional instructions on how to adjust your dosing schedule.      Goals of Therapy     Hidradenitis Suppurativa  Reduce the frequency and severity of new lesions  Minimize pain and suppuration  Prevent disease progression and limit scarring  Maintenance of effective psychosocial functioning    Side Effects & Monitoring Parameters     Injection site reaction (redness, irritation, inflammation localized to the site of administration)  Signs of a common cold - minor sore throat, runny or stuffy nose, etc.  Diarrhea  Mood changes/suicidal ideation    The following side effects should be reported to the provider:  Signs of a hypersensitivity reaction - rash; hives; itching; red, swollen, blistered, or peeling skin; wheezing; tightness in the chest or throat; difficulty breathing, swallowing, or talking; swelling of the mouth, face, lips, tongue, or throat; etc.  Reduced immune function - report signs of infection such as fever; chills; body aches; very bad sore throat; ear or sinus pain; cough; more sputum or change in color of sputum; pain with passing urine; wound that will not heal, etc.  Also at a slightly higher risk of some malignancies (mainly skin and blood cancers) due to this reduced immune function.  In the case of signs of infection - the patient should hold the next dose of Bimzelx and call your primary care provider to ensure adequate medical care.  Treatment may be resumed when infection is treated and patient is asymptomatic.  Muscle pain or weakness  Shortness of breath      Storage, Handling Precautions, & Disposal     Store this medication in the refrigerator.  Do not freeze  May store intact Bimzelx Autoinjector at ?30??C (?86??F) for up to 4 days; may return to the refrigerator if unused  Store in original packaging, protected from light  Do not shake  Dispose of used syringes/pens in a sharps disposal container    Adherence/Missed dose instructions: Administer a missed dose as soon as you remember and then resume dosing on the previous schedule.       Contraindications, Warnings, & Precautions     Have your bloodwork checked as you have been told by your prescriber  Talk with your doctor if you are pregnant, planning to become pregnant, or breastfeeding  Warnings and Precautions:  Suicidal ideation and behavior (SI/B): there may be an increased risk of developing SI/B.   Promptly seek medical attention if a patient has worsening depression, suicidal ideation, or other mood changes.   Infections: there may be an increased risk of infections.   Most common infections in Bimzelx-treated patients include upper respiratory tract, candida, tinea, and Herpes simplex infections.  Tuberculosis: avoid use in patients with active TB. Initiate treatment of latent TB prior to Bimzelx treatment.    Liver biochemical abnormalities: elevated liver enzymes have been observed.  Consider discontinuing Bimzelx if a patient has combined elevations of transaminases and bilirubin associated with Bimzelx treatment.   Inflammatory bowel disease: cases of IBD have been reported.  Avoid use of Bimzelx in patients with  active IBD.   Consider discontinuing Bimzelx if new onset or worsening of signs and symptoms occurs.     Drug/Food Interactions     Medication list reviewed in Epic. The patient was instructed to inform the care team before taking any new medications or supplements. No drug interactions identified.   Talk with your prescriber or pharmacist before receiving any live vaccinations while taking this medication and after you stop taking it      Current Medications (including OTC/herbals), Comorbidities and Allergies     Current Outpatient Medications   Medication Sig Dispense Refill    albuterol HFA 90 mcg/actuation inhaler ProAir HFA 90 mcg/actuation aerosol inhaler      ARIPiprazole (ABILIFY) 2 MG tablet Take 1 tablet (2 mg total) by mouth.      azelastine (ASTELIN) 137 mcg (0.1 %) nasal spray PLACE 1 SPRAY INTO BOTH NOSTRILS 2 (TWO) TIMES DAILY. USE IN EACH NOSTRIL AS DIRECTED      Bifidobacterium infantis (ALIGN ORAL) Take by mouth.      bimekizumab-bkzx 160 mg/mL AtIn Inject the contents of 2 syringes (320 mg total)  every 8 weeks. 4 mL 2 bimekizumab-bkzx 160 mg/mL AtIn Inject the contents of 2 syringes (320 mg total)  under the skin every twenty-eight (28) days. 4 mL 0    busPIRone (BUSPAR) 10 MG tablet Take 1 tablet (10 mg total) by mouth.      cefdinir (OMNICEF) 300 MG capsule Take 1 capsule (300 mg total) by mouth two (2) times a day. 60 capsule 5    clindamycin (CLEOCIN) 300 MG capsule TAKE 1 CAPSULE BY MOUTH TWO TIMES A DAY. 63 capsule 11    diphenhydrAMINE (BENADRYL) 25 mg capsule/tablet Take 1 each (25 mg total) by mouth every six (6) hours as needed for itching.      doxycycline (VIBRA-TABS) 100 MG tablet Take 1 tablet (100 mg total) by mouth two (2) times a day. 60 tablet 0    empty container Misc Use as directed to dispose of Cosentyx pens. 1 each 2    EPINEPHrine (EPIPEN) 0.3 mg/0.3 mL injection INJECTAS DIRECTED AS NEEDED FOR SYSTEMIC REACTIONS  1    erythromycin (ROMYCIN) 5 mg/gram (0.5 %) ophthalmic ointment       gabapentin (NEURONTIN) 600 MG tablet Take 1 tablet (600 mg total) by mouth every morning AND 1 tablet (600 mg total) every evening AND 2 tablets (1,200 mg total) at bedtime. 120 tablet 5    HYDROcodone-acetaminophen (NORCO) 5-325 mg per tablet Take 1 tablet by mouth every four to six hours as needed for moderate to severe pain 15 tablet 0    hydrOXYzine (ATARAX) 25 MG tablet Take 1 tablet (25 mg total) by mouth every eight (8) hours as needed for itching, allergies or anxiety. 20 tablet 0    infliximab (REMICADE IV) Remicade      iron,carbonyl-vitamin C 100-250 mg Tab Take 1 tablet by mouth daily.      lamoTRIgine (LAMICTAL) 100 MG tablet Take 0.5 tablets (50 mg total) by mouth two (2) times a day.      meloxicam (MOBIC) 7.5 MG tablet Take 1 tablet (7.5 mg total) by mouth two (2) times a day as needed for pain. Take with food. 60 tablet 5    methylphenidate HCl (RITALIN) 20 MG tablet Take 1 tablet (20 mg total) by mouth daily.      montelukast (SINGULAIR) 10 mg tablet TAKE 1 TABLET AT BEDTIME      norethindrone (MICRONOR) 0.35 mg  tablet Take 1 tablet by mouth daily.      nystatin (MYCOSTATIN) 100,000 unit/gram ointment Apply topically two (2) times a day. To yeast rash 30 g 5    nystatin (MYCOSTATIN) 100,000 unit/gram powder Apply topically.      ofloxacin (FLOXIN) 0.3 % otic solution PLACE 10 DROPS INTO BOTH EARS DAILY.      olopatadine (PATANOL) 0.1 % ophthalmic solution       ondansetron (ZOFRAN-ODT) 4 MG disintegrating tablet Take 1 tablet (4 mg total) by mouth.      rizatriptan (MAXALT) 10 MG tablet PLEASE SEE ATTACHED FOR DETAILED DIRECTIONS      secukinumab (COSENTYX PEN, 2 PENS,) 150 mg/mL PnIj injection Inject the contents of 2 pens (300 mg total) once weekly at weeks 0, 1, 2, 3, and 4. As loading dose. 10 mL 0    secukinumab (COSENTYX PEN, 2 PENS,) 150 mg/mL PnIj injection Inject the contents of 2 pens (300 mg total) under the skin every twenty-eight (28) days. Maintenance dose. 2 mL 11    sulfamethoxazole-trimethoprim (BACTRIM DS) 800-160 mg per tablet TAKE 1 TABLET (160 MG OF TRIMETHROPRIM TOTAL) BY MOUTH TWICE A DAY 60 tablet 3    sumatriptan (IMITREX) 100 MG tablet Take 1 tablet (100 mg total) by mouth.      traMADoL (ULTRAM) 50 mg tablet Take 1 tablet (50 mg total) by mouth every six (6) hours as needed for pain. 15 tablet 0    triamcinolone (KENALOG) 0.1 % ointment Apply topically two (2) times a day. to inflamed areas of HS twice a day until improved, no longer than 2 weeks at a time. Then stop for two weeks. Restart as needed. 454 g 5    venlafaxine (EFFEXOR) 37.5 MG tablet Take 1 tablet (37.5 mg total) by mouth two (2) times a day.       Current Facility-Administered Medications   Medication Dose Route Frequency Provider Last Rate Last Admin    ertapenem Pincus Sanes) injection 1 g  1 g Intravenous Once Elsie Stain, MD        povidone-iodine 10 % swab 1 Application  1 application. Each Nare Once Harvin Hazel, MD           Allergies   Allergen Reactions    Adhesive Tape-Silicones Hives     Pt can only tolerate cloth tape    Ginger Anaphylaxis    Other Hives     ABD pad causes rash  Pt can only tolerate cloth tape  Pt can only tolerate cloth tape    Amoxicillin-Pot Clavulanate Headache    Opioids - Morphine Analogues       I become very angry    Iodine Nausea Only    Vancomycin Other (See Comments)     Burns her vein really bad and always causes them to blow.       Patient Active Problem List   Diagnosis    Hidradenitis suppurativa    Attention deficit hyperactivity disorder (ADHD), predominantly hyperactive type    Bipolar 2 disorder, major depressive episode (CMS-HCC)    Anxiety disorder    Anemia    Acute metabolic encephalopathy    Prediabetes    Obesity (BMI 35.0-39.9 without comorbidity)    Insomnia    Pulmonary embolism (CMS-HCC)    Vitamin D deficiency       Reviewed and up to date in Epic.    Appropriateness of Therapy     Acute infections noted within Epic:  No active  infections  Patient reported infection: None    Is medication and dose appropriate based on diagnosis and infection status? Yes    Prescription has been clinically reviewed: Yes      Baseline Quality of Life Assessment      How many days over the past month did your HS  keep you from your normal activities? For example, brushing your teeth or getting up in the morning. Patient declined to answer    Financial Information     Medication Assistance provided: Prior Authorization    Anticipated copay of $0 / 28 days (LD) 5 loading doses (16 weeks)                                   $0 / 28 days (MD) reviewed with patient. Verified delivery address.    Delivery Information     Scheduled delivery date: 09/11/2022    Expected start date: 09/11/2022      Medication will be delivered via UPS to the prescription address in [facility].  This shipment will not require a signature.      Explained the services we provide at [facility] and that each month we would call to set up refills.  Stressed importance of returning phone calls so that we could ensure they receive their medications in time each month.  Informed patient that we should be setting up refills 7-10 days prior to when they will run out of medication.  A pharmacist will reach out to perform a clinical assessment periodically.  Informed patient that a welcome packet, containing information about our pharmacy and other support services, a Notice of Privacy Practices, and a drug information handout will be sent.      The patient or caregiver noted above participated in the development of this care plan and knows that they can request review of or adjustments to the care plan at any time.      Patient or caregiver verbalized understanding of the above information as well as how to contact the pharmacy at [phone] option 4 with any questions/concerns.  The pharmacy is open Monday through Friday 8:30am-4:30pm.  A pharmacist is available 24/7 via pager to answer any clinical questions they may have.    Patient Specific Needs     Does the patient have any physical, cognitive, or cultural barriers? No    Does the patient have adequate living arrangements? (i.e. the ability to store and take their medication appropriately) Yes    Did you identify any home environmental safety or security hazards? No    Patient prefers to have medications discussed with  Patient     Is the patient or caregiver able to read and understand education materials at a high school level or above? Yes    Patient's primary language is  English     Is the patient high risk? No    SOCIAL DETERMINANTS OF HEALTH     At the [facility], we have learned that life circumstances - like trouble affording food, housing, utilities, or transportation can affect the health of many of our patients.   That is why we wanted to ask: are you currently experiencing any life circumstances that are negatively impacting your health and/or quality of life? Patient declined to answer    Social Determinants of Health     Financial Resource Strain: Low Risk  (02/11/2019)    Received  from [facility]    Overall Financial Resource Strain (CARDIA)     Difficulty of Paying Living Expenses: Not hard at all   Internet Connectivity: Not on file   Food Insecurity: Low Risk  (07/05/2022)    [facility]    Food vital sign     Within the past 12 months, you worried that your food would run out before you got money to buy more: Never true     Within the past 12 months, the food you bought just didn't last and you didn't have money to get more. : Never true   Tobacco Use: Low Risk  (08/21/2022)    Received from [facility]    Patient History     Smoking Tobacco Use: Never     Smokeless Tobacco Use: Never     Passive Exposure: Not on file   Recent Concern: Tobacco Use - Medium Risk (08/02/2022)    Patient History     Smoking Tobacco Use: Former     Smokeless Tobacco Use: Never     Passive Exposure: Not on file   Housing/Utilities: Not on file   Alcohol Use: Not At Risk (12/01/2021)    [facility]    Alcohol     Audit-C Score: 1   Transportation Needs: Not on file (07/05/2022)   Substance Use: Not on file   Health Literacy: Not on file   Physical Activity: Not on file   Interpersonal Safety: Not on file   Stress: Not on file   Intimate Partner Violence: Unknown (06/09/2021)    Received from [facility]    HITS     Physically Hurt: Not on file     Insult or Talk Down To: Not on file     Threaten Physical Harm: Not on file     Scream or Curse: Not on file   Depression: Not at risk (07/05/2022)    [facility]    PHQ-2     Patient Health Questionnaire-2 Score: 0   Social Connections: Unknown (07/02/2021)    Received from [employer]    Social Network     Social Network: Not on file       Would you be willing to receive help with any of the needs that you have identified today? Not applicable       Elnora Morrison, PharmD  [facility] Specialty Pharmacist

## 2022-09-07 NOTE — Unmapped (Signed)
Melissa Pearson 's COSENTYX PEN (2 PENS) 150 mg/mL Pnij injection (secukinumab) shipment will be canceled  as a result of no refills remain on the prescription.      I have reached out to the patient  at [phone] and communicated the delay. We will not reschedule the medication and have removed this/these medication(s) from the work request.  We have canceled this work request.

## 2022-09-07 NOTE — Unmapped (Signed)
[facility] Specialty Medication Onboarding    Specialty Medication: BIMZELX AUTOINJECTOR 160 mg/mL Atin (bimekizumab-bkzx)  Prior Authorization: Approved   Financial Assistance: No - copay  <$25  Final Copay/Day Supply: $0 / 28 days (LD)          $0 / 28 days (MD)    Insurance Restrictions: Yes - max 1 month supply     Notes to Pharmacist:   Credit Card on File: not applicable    The triage team has completed the benefits investigation and has determined that the patient is able to fill this medication at [facility]. Please contact the patient to complete the onboarding or follow up with the prescribing physician as needed.

## 2022-09-08 ENCOUNTER — Emergency Department (HOSPITAL_BASED_OUTPATIENT_CLINIC_OR_DEPARTMENT_OTHER): Payer: BC Managed Care – PPO | Admitting: Radiology

## 2022-09-08 ENCOUNTER — Ambulatory Visit: Payer: BC Managed Care – PPO

## 2022-09-08 ENCOUNTER — Encounter (HOSPITAL_BASED_OUTPATIENT_CLINIC_OR_DEPARTMENT_OTHER): Payer: Self-pay

## 2022-09-08 ENCOUNTER — Emergency Department (HOSPITAL_BASED_OUTPATIENT_CLINIC_OR_DEPARTMENT_OTHER)
Admission: EM | Admit: 2022-09-08 | Discharge: 2022-09-08 | Disposition: A | Discharge status: Home / Self Care | Payer: BC Managed Care – PPO | Attending: Emergency Medicine | Admitting: Emergency Medicine

## 2022-09-08 DIAGNOSIS — M654 Radial styloid tenosynovitis [de Quervain]: Secondary | ICD-10-CM | POA: Diagnosis not present

## 2022-09-08 DIAGNOSIS — M79644 Pain in right finger(s): Secondary | ICD-10-CM | POA: Diagnosis not present

## 2022-09-08 DIAGNOSIS — M79641 Pain in right hand: Secondary | ICD-10-CM | POA: Diagnosis not present

## 2022-09-08 DIAGNOSIS — M25531 Pain in right wrist: Secondary | ICD-10-CM | POA: Diagnosis not present

## 2022-09-08 MED ORDER — CELECOXIB 200 MG PO CAPS: 200.0000 mg | ORAL_CAPSULE | Freq: Two times a day (BID) | ORAL | 0 refills | Status: DC

## 2022-09-08 MED ORDER — DICLOFENAC SODIUM 1 % EX GEL: 2.0000 g | Freq: Four times a day (QID) | CUTANEOUS | 0 refills | Status: DC

## 2022-09-08 NOTE — Discharge Instructions (Addendum)
1) STOP taking your meloxicam 2) start taking celebrex and using topical voltaren Wear your splint at all times except when showering and bathing Ice the affected area at least 5 times a day.  You may fill small Dixie cup like the type be used for mouthwash with water and freeze them.  Remove it from the freezer and ice massage your wrist and thumb until it is numb and cold several times a day to help reduce inflammation. Follow closely with the hand specialist Dr. Merlyn Lot and make an appointment today.

## 2022-09-08 NOTE — ED Provider Notes (Signed)
[facility] AT [facility] Provider Note   CSN: [id number] Arrival date & time: 09/08/22  8295     History  Chief Complaint  Patient presents with   Hand Pain    Courtney [name] is a 32 y.o. female presents emergency department with a chief complaint of right thumb pain.  She is right-hand dominant.  She noticed that she had a small amount of pain in the thumb with extension a few days ago and now has exquisite pain with all movement of the thumb on the extensor side of the thumb.  She has been using an over-the-counter thumb spica immobilizer without significant relief.  She has been taking daily Mobic and Tylenol.   Hand Pain       Home Medications Prior to Admission medications   Medication Sig Start Date End Date Taking? Authorizing Provider  albuterol (VENTOLIN HFA) 108 (90 Base) MCG/ACT inhaler TAKE 2 PUFFS BY MOUTH EVERY 6 HOURS AS NEEDED FOR WHEEZE OR SHORTNESS OF BREATH 07/05/22   Eden Emms, NP  ARIPiprazole (ABILIFY) 2 MG tablet Take by mouth.    [provider]  benzonatate (TESSALON) 100 MG capsule Take 1-2 tablets 3 times a day as needed for cough 02/28/22   Immordino, Stephen, FNP  bifidobacterium infantis (ALIGN) capsule Take 1 capsule by mouth daily.    [provider]  busPIRone (BUSPAR) 10 MG tablet TAKE 1 TABLET BY MOUTH TWICE A DAY 02/06/22   Jomarie Longs, MD  clindamycin (CLEOCIN) 300 MG capsule Take 300 mg by mouth 2 (two) times daily. 11/09/21   [provider]  COSENTYX SENSOREADY, 300 MG, 150 MG/ML SOAJ Inject 2 Syringes into the skin every 28 (twenty-eight) days. 10/30/21   [provider]  diphenhydrAMINE (BENADRYL) 25 mg capsule 25 mg every 6 (six) hours as needed.    [provider]  EPINEPHrine (EPIPEN 2-PAK) 0.3 mg/0.3 mL IJ SOAJ injection Inject 0.3 mg into the muscle as needed for anaphylaxis. 02/15/22   Eden Emms, NP  glucose blood (CONTOUR TEST) test strip And lancets  #100 05/18/21   Romero Belling, MD  HYDROcodone-acetaminophen (NORCO) 5-325 MG tablet Take 2 tablets by mouth every 6 (six) hours as needed for severe pain. 06/27/22   Molpus, John, MD  hydrOXYzine (ATARAX) 25 MG tablet TAKE 1 TABLET BY MOUTH EVERY 8 HOURS AS NEEDED FOR ITCHING. 01/16/22   Worthy Rancher B, FNP  inFLIXimab (REMICADE IV) Inject 1 Dose into the vein every 30 (thirty) days.    [provider]  Iron-Vitamin C 100-250 MG TABS Take 1 tablet by mouth daily.    [provider]  lamoTRIgine (LAMICTAL) 100 MG tablet TAKE 0.5 TABLETS BY MOUTH 2 TIMES DAILY. 05/24/22   Jomarie Longs, MD  methylphenidate (RITALIN) 20 MG tablet Take 0.5 tablets (10 mg total) by mouth daily as needed. 02/15/22   Eden Emms, NP  methylphenidate (RITALIN) 20 MG tablet Take 1 tablet (20 mg total) by mouth daily before breakfast. 05/16/22 06/15/22  Eden Emms, NP  methylphenidate (RITALIN) 20 MG tablet Take 1 tablet (20 mg total) by mouth daily before breakfast. NEED OFFICE VISIT FOR FURTHER REFILLS 05/16/22 06/15/22  Eden Emms, NP  methylphenidate (RITALIN) 20 MG tablet Take 1 tablet (20 mg total) by mouth daily before breakfast. 05/16/22   Eden Emms, NP  Microlet Lancets MISC See admin instructions. 05/18/21   [provider]  montelukast (SINGULAIR) 10 MG tablet TAKE 1 TABLET BY  MOUTH EVERYDAY AT BEDTIME 05/16/22   Eden Emms, NP  norethindrone (MICRONOR) 0.35 MG tablet Take 1 tablet by mouth daily. 07/19/21   [provider]  ondansetron (ZOFRAN) 4 MG tablet Take 1 tablet (4 mg total) by mouth every 8 (eight) hours as needed for nausea or vomiting. 08/21/22   Leath-Warren, Sadie Haber, NP  ondansetron (ZOFRAN-ODT) 8 MG disintegrating tablet Take 1 tablet (8 mg total) by mouth every 8 (eight) hours as needed for vomiting or nausea. 06/27/22   Molpus, John, MD  traZODone (DESYREL) 50 MG tablet TAKE 1 TABLET BY MOUTH EVERYDAY AT BEDTIME 11/10/21   Gweneth Dimitri, MD       Allergies    Other, Shellfish allergy, Shellfish-derived products, Tapentadol, Tape, Ginger, Morphine and codeine, and Vancomycin    Review of Systems   Review of Systems  Physical Exam Updated Vital Signs BP 106/79 (BP Location: Right Arm)   Pulse 95   Temp 98.4 F (36.9 C) (Oral)   Resp 17   Ht 5\' 1"  (1.549 m)   Wt 88.5 kg   LMP  (LMP Unknown)   SpO2 100%   BMI 36.84 kg/m  Physical Exam Vitals and nursing note reviewed.  Constitutional:      General: She is not in acute distress.    Appearance: She is well-developed. She is not diaphoretic.  HENT:     Head: Normocephalic and atraumatic.     Right Ear: External ear normal.     Left Ear: External ear normal.     Nose: Nose normal.     Mouth/Throat:     Mouth: Mucous membranes are moist.  Eyes:     General: No scleral icterus.    Conjunctiva/sclera: Conjunctivae normal.  Cardiovascular:     Rate and Rhythm: Normal rate and regular rhythm.     Heart sounds: Normal heart sounds. No murmur heard.    No friction rub. No gallop.  Pulmonary:     Effort: Pulmonary effort is normal. No respiratory distress.     Breath sounds: Normal breath sounds.  Abdominal:     General: Bowel sounds are normal. There is no distension.     Palpations: Abdomen is soft. There is no mass.     Tenderness: There is no abdominal tenderness. There is no guarding.  Musculoskeletal:     Cervical back: Normal range of motion.     Comments: Decreased range of motion of the right thumb.  Tenderness along the radial side of the thumb along the extensor tendons.  Finkelstein's test positive  Skin:    General: Skin is warm and dry.  Neurological:     Mental Status: She is alert and oriented to person, place, and time.  Psychiatric:        Behavior: Behavior normal.     ED Results / Procedures / Treatments   Labs (all labs ordered are listed, but only abnormal results are displayed) Labs Reviewed - No data to  display  EKG None  Radiology DG Hand Complete Right  Result Date: 09/08/2022 CLINICAL DATA:  Pain with movement. EXAM: RIGHT HAND - COMPLETE 3+ VIEW COMPARISON:  None Available. FINDINGS: There is no evidence of fracture or dislocation. There is no evidence of arthropathy or other focal bone abnormality. Soft tissues are unremarkable. IMPRESSION: Negative. Electronically Signed   By: Kennith Center M.D.   On: 09/08/2022 10:57    Procedures Procedures    Medications Ordered in ED Medications - No data to display  ED Course/ Medical Decision Making/ A&P                             Medical Decision Making Amount and/or Complexity of Data Reviewed Radiology: ordered.   Patient with what appears to be de Quervain's tenosynovitis.  No evidence of infectious etiology, no known injuries.  I visualized and interpreted a right hand x-ray which showed no abnormalities.  Will discharge with symptomatic treatment, close outpatient follow-up with hand and return precautions.  Otherwise appropriate for discharge at this time        Final Clinical Impression(s) / ED Diagnoses Final diagnoses:  None    Rx / DC Orders ED Discharge Orders     None         Arthor Captain, PA-C 09/08/22 1159    Alvira Monday, MD 09/10/22 2055

## 2022-09-08 NOTE — ED Triage Notes (Signed)
Pt c/o pain in right thumb that goes to her wrist. Pt states that it is painful with movement. Pt has her own brace in place and has not had any improvement . Pain started Monday

## 2022-09-10 MED FILL — BIMZELX AUTOINJECTOR 160 MG/ML SUBCUTANEOUS AUTO-INJECTOR: SUBCUTANEOUS | 28 days supply | Qty: 2 | Fill #0

## 2022-09-17 DIAGNOSIS — R200 Anesthesia of skin: Secondary | ICD-10-CM | POA: Diagnosis not present

## 2022-09-19 DIAGNOSIS — L732 Hidradenitis suppurativa: Principal | ICD-10-CM

## 2022-09-19 MED ORDER — SULFAMETHOXAZOLE 800 MG-TRIMETHOPRIM 160 MG TABLET
ORAL_TABLET | 3 refills | 0.00000 days
Start: 2022-09-19 — End: ?

## 2022-09-21 ENCOUNTER — Encounter: Payer: BC Managed Care – PPO | Admitting: Nurse Practitioner

## 2022-09-23 ENCOUNTER — Emergency Department (HOSPITAL_BASED_OUTPATIENT_CLINIC_OR_DEPARTMENT_OTHER)
Admission: EM | Admit: 2022-09-23 | Discharge: 2022-09-23 | Discharge status: Against Medical Advice | Payer: BC Managed Care – PPO | Attending: Emergency Medicine | Admitting: Emergency Medicine

## 2022-09-23 ENCOUNTER — Emergency Department (HOSPITAL_BASED_OUTPATIENT_CLINIC_OR_DEPARTMENT_OTHER)
Admission: EM | Admit: 2022-09-23 | Discharge: 2022-09-24 | Disposition: A | Discharge status: Home / Self Care | Payer: BC Managed Care – PPO | Source: Home / Self Care | Attending: Emergency Medicine | Admitting: Emergency Medicine

## 2022-09-23 ENCOUNTER — Encounter (HOSPITAL_BASED_OUTPATIENT_CLINIC_OR_DEPARTMENT_OTHER): Payer: Self-pay

## 2022-09-23 ENCOUNTER — Other Ambulatory Visit: Payer: Self-pay

## 2022-09-23 DIAGNOSIS — H6692 Otitis media, unspecified, left ear: Secondary | ICD-10-CM | POA: Insufficient documentation

## 2022-09-23 DIAGNOSIS — R0989 Other specified symptoms and signs involving the circulatory and respiratory systems: Secondary | ICD-10-CM | POA: Diagnosis not present

## 2022-09-23 DIAGNOSIS — Z5321 Procedure and treatment not carried out due to patient leaving prior to being seen by health care provider: Secondary | ICD-10-CM | POA: Insufficient documentation

## 2022-09-23 DIAGNOSIS — J352 Hypertrophy of adenoids: Secondary | ICD-10-CM | POA: Diagnosis not present

## 2022-09-23 DIAGNOSIS — R070 Pain in throat: Secondary | ICD-10-CM | POA: Diagnosis not present

## 2022-09-23 DIAGNOSIS — T7840XA Allergy, unspecified, initial encounter: Secondary | ICD-10-CM | POA: Diagnosis not present

## 2022-09-23 DIAGNOSIS — R457 State of emotional shock and stress, unspecified: Secondary | ICD-10-CM | POA: Diagnosis not present

## 2022-09-23 LAB — GROUP A STREP BY PCR
Group A Strep by PCR: NOT DETECTED
Group A Strep by PCR: NOT DETECTED

## 2022-09-23 NOTE — ED Notes (Signed)
[facility] ED registration called to say pt is currently in their lobby

## 2022-09-23 NOTE — ED Triage Notes (Addendum)
Pt states she feels like something is "stuck" in her throat But there is nothing. Throat is sore , she has had her tonsils removed but not adenoids +trouble swallowing Also reports purulent drainage coming from left ear

## 2022-09-23 NOTE — ED Triage Notes (Signed)
Patient here BIB [facility] from Home.   Endorses Globus Sensation. States began to have a Sore Throat Friday that has since progressed into Swollen Feeling in Throat. Along with Globus Sensation. No known Fevers. No Cough.  Speaking in Clear Sentences in Triage. No Oral/Airway Occlusion at this time. 10 mL of Benadryl PTA.   Somewhat Anxious during triage. A&Ox4. GCS 15. Ambulatory.

## 2022-09-24 ENCOUNTER — Encounter (HOSPITAL_BASED_OUTPATIENT_CLINIC_OR_DEPARTMENT_OTHER): Payer: Self-pay

## 2022-09-24 ENCOUNTER — Emergency Department (HOSPITAL_BASED_OUTPATIENT_CLINIC_OR_DEPARTMENT_OTHER): Payer: BC Managed Care – PPO

## 2022-09-24 DIAGNOSIS — J352 Hypertrophy of adenoids: Secondary | ICD-10-CM | POA: Diagnosis not present

## 2022-09-24 LAB — CBC WITH DIFFERENTIAL/PLATELET
Abs Immature Granulocytes: 0.03 10*3/uL (ref 0.00–0.07)
Basophils Absolute: 0.1 10*3/uL (ref 0.0–0.1)
Basophils Relative: 1 %
Eosinophils Absolute: 0.5 10*3/uL (ref 0.0–0.5)
Eosinophils Relative: 5 %
HCT: 35.4 % — ABNORMAL LOW (ref 36.0–46.0)
Hemoglobin: 11.5 g/dL — ABNORMAL LOW (ref 12.0–15.0)
Immature Granulocytes: 0 %
Lymphocytes Relative: 36 %
Lymphs Abs: 4 10*3/uL (ref 0.7–4.0)
MCH: 28.6 pg (ref 26.0–34.0)
MCHC: 32.5 g/dL (ref 30.0–36.0)
MCV: 88.1 fL (ref 80.0–100.0)
Monocytes Absolute: 0.8 10*3/uL (ref 0.1–1.0)
Monocytes Relative: 8 %
Neutro Abs: 5.6 10*3/uL (ref 1.7–7.7)
Neutrophils Relative %: 50 %
Platelets: 289 10*3/uL (ref 150–400)
RBC: 4.02 MIL/uL (ref 3.87–5.11)
RDW: 15.2 % (ref 11.5–15.5)
WBC: 11 10*3/uL — ABNORMAL HIGH (ref 4.0–10.5)
nRBC: 0 % (ref 0.0–0.2)

## 2022-09-24 LAB — COMPREHENSIVE METABOLIC PANEL
ALT: 18 U/L (ref 0–44)
AST: 25 U/L (ref 15–41)
Albumin: 3.1 g/dL — ABNORMAL LOW (ref 3.5–5.0)
Alkaline Phosphatase: 121 U/L (ref 38–126)
Anion gap: 6 (ref 5–15)
BUN: 6 mg/dL (ref 6–20)
CO2: 24 mmol/L (ref 22–32)
Calcium: 8.3 mg/dL — ABNORMAL LOW (ref 8.9–10.3)
Chloride: 103 mmol/L (ref 98–111)
Creatinine, Ser: 0.9 mg/dL (ref 0.44–1.00)
GFR, Estimated: >60 mL/min (ref >60)
Glucose, Bld: 91 mg/dL (ref 70–99)
Potassium: 4 mmol/L (ref 3.5–5.1)
Sodium: 133 mmol/L — ABNORMAL LOW (ref 135–145)
Total Bilirubin: 0.6 mg/dL (ref 0.3–1.2)
Total Protein: 8.6 g/dL — ABNORMAL HIGH (ref 6.5–8.1)

## 2022-09-24 MED ORDER — DICYCLOMINE HCL 10 MG PO CAPS: 10.0000 mg | ORAL_CAPSULE | Freq: Once | ORAL | Status: DC

## 2022-09-24 MED ORDER — SODIUM CHLORIDE 0.9 % IV BOLUS
1000.0000 mL | Freq: Once | INTRAVENOUS | Status: AC
  Administered 2022-09-24: 1000 mL via INTRAVENOUS

## 2022-09-24 MED ORDER — AMOXICILLIN-POT CLAVULANATE 875-125 MG PO TABS
1.0000 | ORAL_TABLET | Freq: Once | ORAL | Status: DC
  Filled 2022-09-24: qty 1

## 2022-09-24 MED ORDER — FENTANYL CITRATE PF 50 MCG/ML IJ SOSY
50.0000 ug | PREFILLED_SYRINGE | Freq: Once | INTRAMUSCULAR | Status: AC
  Administered 2022-09-24: 50 ug via INTRAVENOUS
  Filled 2022-09-24: qty 1

## 2022-09-24 MED ORDER — IOHEXOL 300 MG/ML  SOLN
75.0000 mL | Freq: Once | INTRAMUSCULAR | Status: AC | PRN
  Administered 2022-09-24: 75 mL via INTRAVENOUS

## 2022-09-24 MED ORDER — SODIUM CHLORIDE 0.9 % IV SOLN
1.0000 g | Freq: Once | INTRAVENOUS | Status: AC
  Administered 2022-09-24: 1 g via INTRAVENOUS
  Filled 2022-09-24: qty 10

## 2022-09-24 MED ORDER — DEXAMETHASONE SODIUM PHOSPHATE 10 MG/ML IJ SOLN
10.0000 mg | Freq: Once | INTRAMUSCULAR | Status: AC
  Administered 2022-09-24: 10 mg via INTRAVENOUS
  Filled 2022-09-24: qty 1

## 2022-09-24 MED ORDER — ALUM & MAG HYDROXIDE-SIMETH 200-200-20 MG/5ML PO SUSP
30.0000 mL | Freq: Once | ORAL | Status: AC
  Administered 2022-09-24: 30 mL via ORAL
  Filled 2022-09-24: qty 30

## 2022-09-24 MED ORDER — AMOXICILLIN-POT CLAVULANATE 400-57 MG/5ML PO SUSR: 800.0000 mg | Freq: Two times a day (BID) | ORAL | 0 refills | Status: AC

## 2022-09-24 NOTE — ED Provider Notes (Signed)
[place] EM[facility] DEPARTMENT AT [facility] Provider Note   CSN: [id number] Arrival date & time: 09/23/22  2243     History  Chief Complaint  Patient presents with   Sore Throat    Courtney [name] is a 32 y.o. female history of de Quervain's tenosynovitis, here presenting with sore throat and trouble swallowing.  Patient states that she has trouble swallowing for the last 3 to 4 days.  Patient states that she also follows up with ENT and had reconstructive surgery of the tympanic membranes and noticed some purulent discharge from the left ear.  Patient states that she had her tonsils but never had adenoids out.  The history is provided by the patient.       Home Medications Prior to Admission medications   Medication Sig Start Date End Date Taking? Authorizing Provider  albuterol (VENTOLIN HFA) 108 (90 Base) MCG/ACT inhaler TAKE 2 PUFFS BY MOUTH EVERY 6 HOURS AS NEEDED FOR WHEEZE OR SHORTNESS OF BREATH 07/05/22   Eden Emms, NP  ARIPiprazole (ABILIFY) 2 MG tablet Take by mouth.    [provider]  benzonatate (TESSALON) 100 MG capsule Take 1-2 tablets 3 times a day as needed for cough 02/28/22   Immordino, Stephen, FNP  bifidobacterium infantis (ALIGN) capsule Take 1 capsule by mouth daily.    [provider]  busPIRone (BUSPAR) 10 MG tablet TAKE 1 TABLET BY MOUTH TWICE A DAY 02/06/22   Jomarie Longs, MD  celecoxib (CELEBREX) 200 MG capsule Take 1 capsule (200 mg total) by mouth 2 (two) times daily. 09/08/22   Arthor Captain, PA-C  clindamycin (CLEOCIN) 300 MG capsule Take 300 mg by mouth 2 (two) times daily. 11/09/21   [provider]  COSENTYX SENSOREADY, 300 MG, 150 MG/ML SOAJ Inject 2 Syringes into the skin every 28 (twenty-eight) days. 10/30/21   [provider]  diclofenac Sodium (VOLTAREN) 1 % GEL Apply 2 g topically 4 (four) times daily. 09/08/22   Arthor Captain, PA-C  diphenhydrAMINE (BENADRYL) 25 mg capsule 25 mg every 6  (six) hours as needed.    [provider]  EPINEPHrine (EPIPEN 2-PAK) 0.3 mg/0.3 mL IJ SOAJ injection Inject 0.3 mg into the muscle as needed for anaphylaxis. 02/15/22   Eden Emms, NP  glucose blood (CONTOUR TEST) test strip And lancets #100 05/18/21   Romero Belling, MD  HYDROcodone-acetaminophen (NORCO) 5-325 MG tablet Take 2 tablets by mouth every 6 (six) hours as needed for severe pain. 06/27/22   Molpus, John, MD  hydrOXYzine (ATARAX) 25 MG tablet TAKE 1 TABLET BY MOUTH EVERY 8 HOURS AS NEEDED FOR ITCHING. 01/16/22   Worthy Rancher B, FNP  inFLIXimab (REMICADE IV) Inject 1 Dose into the vein every 30 (thirty) days.    [provider]  Iron-Vitamin C 100-250 MG TABS Take 1 tablet by mouth daily.    [provider]  lamoTRIgine (LAMICTAL) 100 MG tablet TAKE 0.5 TABLETS BY MOUTH 2 TIMES DAILY. 05/24/22   Jomarie Longs, MD  methylphenidate (RITALIN) 20 MG tablet Take 0.5 tablets (10 mg total) by mouth daily as needed. 02/15/22   Eden Emms, NP  methylphenidate (RITALIN) 20 MG tablet Take 1 tablet (20 mg total) by mouth daily before breakfast. 05/16/22 06/15/22  Eden Emms, NP  methylphenidate (RITALIN) 20 MG tablet Take 1 tablet (20 mg total) by mouth daily before breakfast. NEED OFFICE VISIT FOR FURTHER REFILLS 05/16/22 06/15/22  Eden Emms, NP  methylphenidate (RITALIN) 20 MG  tablet Take 1 tablet (20 mg total) by mouth daily before breakfast. 05/16/22   Eden Emms, NP  Microlet Lancets MISC See admin instructions. 05/18/21   [provider]  montelukast (SINGULAIR) 10 MG tablet TAKE 1 TABLET BY MOUTH EVERYDAY AT BEDTIME 05/16/22   Eden Emms, NP  norethindrone (MICRONOR) 0.35 MG tablet Take 1 tablet by mouth daily. 07/19/21   [provider]  ondansetron (ZOFRAN) 4 MG tablet Take 1 tablet (4 mg total) by mouth every 8 (eight) hours as needed for nausea or vomiting. 08/21/22   Leath-Warren, Sadie Haber, NP  ondansetron (ZOFRAN-ODT) 8 MG  disintegrating tablet Take 1 tablet (8 mg total) by mouth every 8 (eight) hours as needed for vomiting or nausea. 06/27/22   Molpus, John, MD  traZODone (DESYREL) 50 MG tablet TAKE 1 TABLET BY MOUTH EVERYDAY AT BEDTIME 11/10/21   Gweneth Dimitri, MD      Allergies    Other, Shellfish allergy, Shellfish-derived products, Tapentadol, Tape, Ginger, Morphine and codeine, and Vancomycin    Review of Systems   Review of Systems  HENT:  Positive for trouble swallowing.   All other systems reviewed and are negative.   Physical Exam Updated Vital Signs BP 120/73 (BP Location: Left Arm)   Pulse 79   Temp 97.9 F (36.6 C) (Oral)   Resp (!) 23   Ht 5\' 1"  (1.549 m)   Wt 88.5 kg   LMP 04/05/2022   SpO2 100%   BMI 36.84 kg/m  Physical Exam Vitals and nursing note reviewed.  Constitutional:      Appearance: She is well-developed.  HENT:     Head: Normocephalic.     Ears:     Comments: Left TM is bulging and red.  Right TM is normal    Mouth/Throat:     Comments: Patient has enlarged uvula.  Uvula is midline.  Absent tonsils bilaterally Cardiovascular:     Rate and Rhythm: Normal rate and regular rhythm.     Heart sounds: Normal heart sounds.  Pulmonary:     Effort: Pulmonary effort is normal.     Breath sounds: Normal breath sounds.  Abdominal:     General: Bowel sounds are normal.     Palpations: Abdomen is soft.  Musculoskeletal:     Cervical back: Normal range of motion.  Skin:    General: Skin is warm.     Capillary Refill: Capillary refill takes less than 2 seconds.  Neurological:     General: No focal deficit present.     Mental Status: She is alert and oriented to person, place, and time.  Psychiatric:        Mood and Affect: Mood normal.        Behavior: Behavior normal.     ED Results / Procedures / Treatments   Labs (all labs ordered are listed, but only abnormal results are displayed) Labs Reviewed  CBC WITH DIFFERENTIAL/PLATELET - Abnormal; Notable for the  following components:      Result Value   WBC 11.0 (*)    Hemoglobin 11.5 (*)    HCT 35.4 (*)    All other components within normal limits  GROUP A STREP BY PCR  COMPREHENSIVE METABOLIC PANEL    EKG None  Radiology No results found.  Procedures Procedures    Medications Ordered in ED Medications  dicyclomine (BENTYL) capsule 10 mg (10 mg Oral Not Given 09/24/22 0027)  amoxicillin-clavulanate (AUGMENTIN) 875-125 MG per tablet 1 tablet (1 tablet Oral  Not Given 09/24/22 0028)  alum & mag hydroxide-simeth (MAALOX/MYLANTA) 200-200-20 MG/5ML suspension 30 mL (30 mLs Oral Given 09/24/22 0013)  dexamethasone (DECADRON) injection 10 mg (10 mg Intravenous Given 09/24/22 0024)  sodium chloride 0.9 % bolus 1,000 mL (1,000 mLs Intravenous New Bag/Given 09/24/22 0023)    ED Course/ Medical Decision Making/ A&P                             Medical Decision Making ADELMA BOWDOIN is a 32 y.o. female here presenting with sore throat and trouble swallowing and also left ear discharge.  Patient does have otitis media on exam.  Patient also has enlarged uvula.  Will do CT neck to rule out enlarged adenoids or mass.  Will also get CBC and CMP.  Will give Decadron and Augmentin  2:42 AM Patient unable to tolerate pills so was given rocephin.  Reviewed patient's labs and white blood cell count is 11.  I also independently interpreted her CT scan.  Patient had evidence of otitis media and also swollen adenoids.  Patient already contacted her ENT doctors to schedule for adenoid removal.  In the meantime patient will be discharged home with a course of Augmentin  Problems Addressed: Adenoid hypertrophy: acute illness or injury Left otitis media, unspecified otitis media type: acute illness or injury  Amount and/or Complexity of Data Reviewed Labs: ordered. Decision-making details documented in ED Course. Radiology: ordered and independent interpretation performed. Decision-making details documented in ED  Course.  Risk OTC drugs. Prescription drug management.    Final Clinical Impression(s) / ED Diagnoses Final diagnoses:  None    Rx / DC Orders ED Discharge Orders     None         Charlynne Pander, MD 09/24/22 [phone]

## 2022-09-24 NOTE — Discharge Instructions (Addendum)
Take Augmentin twice a day for a week for your infection.  Follow-up with your ENT doctor regarding adenoids removal   Take Tylenol or Motrin for pain  Return to ER if you have worsening throat swelling, severe pain, dehydration

## 2022-10-01 ENCOUNTER — Ambulatory Visit: Admit: 2022-10-01 | Discharge: 2022-10-02 | Payer: PRIVATE HEALTH INSURANCE

## 2022-10-01 DIAGNOSIS — L732 Hidradenitis suppurativa: Secondary | ICD-10-CM | POA: Diagnosis not present

## 2022-10-01 MED ADMIN — inFLIXimab-axxq (AVSOLA) 10 mg/kg = 900 mg in sodium chloride (NS) 250 mL IVPB: 10 mg/kg | INTRAVENOUS | @ 16:00:00 | Stop: 2022-10-01 | NDC 55513067001

## 2022-10-01 MED ADMIN — acetaminophen (TYLENOL) tablet 650 mg: 650 mg | ORAL | @ 16:00:00 | Stop: 2022-10-01 | NDC 50580048790

## 2022-10-01 MED ADMIN — cetirizine (ZYRTEC) tablet 10 mg: 10 mg | ORAL | @ 16:00:00 | Stop: 2022-10-01 | NDC 59746028632

## 2022-10-01 MED ADMIN — diphenhydrAMINE (BENADRYL) capsule/tablet 25 mg: 25 mg | ORAL | @ 16:00:00 | Stop: 2022-10-01 | NDC 97807007030

## 2022-10-01 NOTE — Unmapped (Signed)
Patient presents for standard Avsola (infliximab-axxq) infusion.  In no acute distress. Vitals stable.  Reports no new medical issues or S/S of infection.  PIV placed.  See MAR for premeds.    1211 Avsola (infliximab-axxq) 900 mg infusing as follows:     49ml/hr x 15 min  57ml/hr x 15 min  40ml/hr x 15 min  2ml/hr x 15 min  167ml/hr x 30 min  241ml/hr for the remainder of the infusion.    1416 Avsola (infliximab-axxq) infusion complete.  PIV flushed with NS.  Vitals stable.  Patient without any s/s of adverse reaction.  IV d/c'd.  Patient discharged from Infusion Center.

## 2022-10-02 NOTE — Unmapped (Signed)
** second loading dose **  -16 weeks total (4 fills all together)    [facility] Clinical Assessment & Refill Coordination Note    Melissa Pearson, DOB: Aug 25, 1990  Phone: [phone] (home) [phone] (work)    All above HIPAA information was verified with patient.     Was a Nurse, learning disability used for this call? No    Specialty Medication(s):   Inflammatory Disorders: Bimzelx     Current Outpatient Medications   Medication Sig Dispense Refill    albuterol HFA 90 mcg/actuation inhaler ProAir HFA 90 mcg/actuation aerosol inhaler      ARIPiprazole (ABILIFY) 2 MG tablet Take 1 tablet (2 mg total) by mouth.      azelastine (ASTELIN) 137 mcg (0.1 %) nasal spray PLACE 1 SPRAY INTO BOTH NOSTRILS 2 (TWO) TIMES DAILY. USE IN EACH NOSTRIL AS DIRECTED      Bifidobacterium infantis (ALIGN ORAL) Take by mouth.      bimekizumab-bkzx 160 mg/mL AtIn Inject the contents of 2 syringes (320 mg total)  every 8 weeks. 4 mL 2    bimekizumab-bkzx 160 mg/mL AtIn Inject the contents of 2 syringes (320 mg total)  under the skin every twenty-eight (28) days. 2 mL 4    busPIRone (BUSPAR) 10 MG tablet Take 1 tablet (10 mg total) by mouth.      cefdinir (OMNICEF) 300 MG capsule Take 1 capsule (300 mg total) by mouth two (2) times a day. 60 capsule 5    clindamycin (CLEOCIN) 300 MG capsule TAKE 1 CAPSULE BY MOUTH TWO TIMES A DAY. 63 capsule 11    diphenhydrAMINE (BENADRYL) 25 mg capsule/tablet Take 1 each (25 mg total) by mouth every six (6) hours as needed for itching.      doxycycline (VIBRA-TABS) 100 MG tablet Take 1 tablet (100 mg total) by mouth two (2) times a day. 60 tablet 0    empty container Misc Use as directed to dispose of Cosentyx pens. 1 each 2    EPINEPHrine (EPIPEN) 0.3 mg/0.3 mL injection INJECTAS DIRECTED AS NEEDED FOR SYSTEMIC REACTIONS  1    erythromycin (ROMYCIN) 5 mg/gram (0.5 %) ophthalmic ointment       gabapentin (NEURONTIN) 600 MG tablet Take 1 tablet (600 mg total) by mouth every morning AND 1 tablet (600 mg total) every evening AND 2 tablets (1,200 mg total) at bedtime. 120 tablet 5    HYDROcodone-acetaminophen (NORCO) 5-325 mg per tablet Take 1 tablet by mouth every four to six hours as needed for moderate to severe pain 15 tablet 0    hydrOXYzine (ATARAX) 25 MG tablet Take 1 tablet (25 mg total) by mouth every eight (8) hours as needed for itching, allergies or anxiety. 20 tablet 0    infliximab (REMICADE IV) Remicade      iron,carbonyl-vitamin C 100-250 mg Tab Take 1 tablet by mouth daily.      lamoTRIgine (LAMICTAL) 100 MG tablet Take 0.5 tablets (50 mg total) by mouth two (2) times a day.      meloxicam (MOBIC) 7.5 MG tablet Take 1 tablet (7.5 mg total) by mouth two (2) times a day as needed for pain. Take with food. 60 tablet 5    methylphenidate HCl (RITALIN) 20 MG tablet Take 1 tablet (20 mg total) by mouth daily.      montelukast (SINGULAIR) 10 mg tablet TAKE 1 TABLET AT BEDTIME      norethindrone (MICRONOR) 0.35 mg tablet Take 1 tablet by mouth daily.  nystatin (MYCOSTATIN) 100,000 unit/gram ointment Apply topically two (2) times a day. To yeast rash 30 g 5    nystatin (MYCOSTATIN) 100,000 unit/gram powder Apply topically.      ofloxacin (FLOXIN) 0.3 % otic solution PLACE 10 DROPS INTO BOTH EARS DAILY.      olopatadine (PATANOL) 0.1 % ophthalmic solution       ondansetron (ZOFRAN-ODT) 4 MG disintegrating tablet Take 1 tablet (4 mg total) by mouth.      rizatriptan (MAXALT) 10 MG tablet PLEASE SEE ATTACHED FOR DETAILED DIRECTIONS      secukinumab (COSENTYX PEN, 2 PENS,) 150 mg/mL PnIj injection Inject the contents of 2 pens (300 mg total) once weekly at weeks 0, 1, 2, 3, and 4. As loading dose. 10 mL 0    secukinumab (COSENTYX PEN, 2 PENS,) 150 mg/mL PnIj injection Inject the contents of 2 pens (300 mg total) under the skin every twenty-eight (28) days. Maintenance dose. 2 mL 11    sulfamethoxazole-trimethoprim (BACTRIM DS) 800-160 mg per tablet TAKE 1 TABLET (160 MG OF TRIMETHROPRIM TOTAL) BY MOUTH TWICE A DAY 60 tablet 3    sumatriptan (IMITREX) 100 MG tablet Take 1 tablet (100 mg total) by mouth.      traMADoL (ULTRAM) 50 mg tablet Take 1 tablet (50 mg total) by mouth every six (6) hours as needed for pain. 15 tablet 0    triamcinolone (KENALOG) 0.1 % ointment Apply topically two (2) times a day. to inflamed areas of HS twice a day until improved, no longer than 2 weeks at a time. Then stop for two weeks. Restart as needed. 454 g 5    venlafaxine (EFFEXOR) 37.5 MG tablet Take 1 tablet (37.5 mg total) by mouth two (2) times a day.       Current Facility-Administered Medications   Medication Dose Route Frequency Provider Last Rate Last Admin    ertapenem Pincus Sanes) injection 1 g  1 g Intravenous Once Elsie Stain, MD        povidone-iodine 10 % swab 1 Application  1 application. Each Nare Once Harvin Hazel, MD            Changes to medications: Alaxandra reports no changes at this time.    Allergies   Allergen Reactions    Adhesive Tape-Silicones Hives     Pt can only tolerate cloth tape    Ginger Anaphylaxis    Other Hives     ABD pad causes rash  Pt can only tolerate cloth tape  Pt can only tolerate cloth tape    Amoxicillin-Pot Clavulanate Headache    Opioids - Morphine Analogues       I become very angry    Iodine Nausea Only    Vancomycin Other (See Comments)     Burns her vein really bad and always causes them to blow.       Changes to allergies: No    SPECIALTY MEDICATION ADHERENCE     Bimzelx Autoinjector 160 mg/ml: 0 days of medicine on hand   Medication Adherence    Patient reported X missed doses in the last month: 0  Specialty Medication: Bimzelx Autoinjector 160mg /mL  Informant: patient  Confirmed plan for next specialty medication refill: delivery by pharmacy  Refills needed for supportive medications: not needed          Specialty medication(s) dose(s) confirmed: Regimen is correct and unchanged.     Are there any concerns with adherence? No    Adherence counseling provided? Not  needed    CLINICAL MANAGEMENT AND INTERVENTION      Clinical Benefit Assessment:    Do you feel the medicine is effective or helping your condition? Yes    Clinical Benefit counseling provided? Not needed    Adverse Effects Assessment:    Are you experiencing any side effects? No    Are you experiencing difficulty administering your medicine? No    Quality of Life Assessment:    Quality of Life    Rheumatology  Oncology  Dermatology  1. What impact has your specialty medication had on the symptoms of your skin condition (i.e. itchiness, soreness, stinging)?: Some  2. What impact has your specialty medication had on your comfort level with your skin?: Some  Cystic Fibrosis          How many days over the past month did your HS  keep you from your normal activities? For example, brushing your teeth or getting up in the morning. Patient declined to answer    Have you discussed this with your provider? Not needed    Acute Infection Status:    Acute infections noted within Epic:  No active infections  Patient reported infection: None    Therapy Appropriateness:    Is therapy appropriate and patient progressing towards therapeutic goals? Yes, therapy is appropriate and should be continued    DISEASE/MEDICATION-SPECIFIC INFORMATION      For patients on injectable medications: Patient currently has 0 doses left.  Next injection is scheduled for 10/09/2022.    Chronic Inflammatory Diseases: Have you experienced any flares in the last month? No  Has this been reported to your provider? Not applicable    PATIENT SPECIFIC NEEDS     Does the patient have any physical, cognitive, or cultural barriers? No    Is the patient high risk? No    Did the patient require a clinical intervention? No    Does the patient require physician intervention or other additional services (i.e., nutrition, smoking cessation, social work)? No    SOCIAL DETERMINANTS OF HEALTH     At the [facility], we have learned that life circumstances - like trouble affording food, housing, utilities, or transportation can affect the health of many of our patients.   That is why we wanted to ask: are you currently experiencing any life circumstances that are negatively impacting your health and/or quality of life? Patient declined to answer    Social Determinants of Health     Financial Resource Strain: Low Risk  (02/11/2019)    Received from [facility], [facility]    Overall Financial Resource Strain (CARDIA)     Difficulty of Paying Living Expenses: Not hard at all   Internet Connectivity: Not on file   Food Insecurity: Low Risk  (07/05/2022)    [facility]    Food vital sign     Within the past 12 months, you worried that your food would run out before you got money to buy more: Never true     Within the past 12 months, the food you bought just didn't last and you didn't have money to get more. : Never true   Tobacco Use: Low Risk  (09/23/2022)    Received from [facility]    Patient History     Smoking Tobacco Use: Never     Smokeless Tobacco Use: Never     Passive Exposure: Not on file   Recent Concern: Tobacco Use - Medium Risk (  08/02/2022)    Patient History     Smoking Tobacco Use: Former     Smokeless Tobacco Use: Never     Passive Exposure: Not on file   Housing/Utilities: Not on file   Alcohol Use: Not At Risk (12/01/2021)    [facility]    Alcohol     Audit-C Score: 1   Transportation Needs: Not on file (07/05/2022)   Substance Use: Not on file   Health Literacy: Not on file   Physical Activity: Not on file   Interpersonal Safety: Unknown (10/02/2022)    Interpersonal Safety     Unsafe Where You Currently Live: Not on file     Physically Hurt by Anyone: Not on file     Abused by Anyone: Not on file   Stress: Not on file   Intimate Partner Violence: Unknown (06/09/2021)    Received from [facility]    HITS     Physically Hurt: Not on file     Insult or Talk Down To: Not on file     Threaten Physical Harm: Not on file     Scream or Curse: Not on file   Depression: Not at risk (07/05/2022)    [facility]    PHQ-2     Patient Health Questionnaire-2 Score: 0   Social Connections: Unknown (07/02/2021)    [facility] from [facility]    Social Network     Social Network: Not on file       Would you be willing to receive help with any of the needs that you have identified today? Not applicable       SHIPPING     Specialty Medication(s) to be Shipped:   Inflammatory Disorders: Bimzelx    Other medication(s) to be shipped: No additional medications requested for fill at this time     Changes to insurance: No    Delivery Scheduled: Yes, Expected medication delivery date: 10/04/2022.     Medication will be delivered via UPS to the confirmed prescription address in [facility].    The patient will receive a drug information handout for each medication shipped and additional FDA Medication Guides as required.  Verified that patient has previously received a Conservation officer, historic buildings and a Surveyor, mining.    The patient or caregiver noted above participated in the development of this care plan and knows that they can request review of or adjustments to the care plan at any time.      All of the patient's questions and concerns have been addressed.    Elnora Morrison, PharmD   [facility] Specialty Pharmacist

## 2022-10-03 ENCOUNTER — Telehealth: Payer: Self-pay

## 2022-10-03 DIAGNOSIS — R200 Anesthesia of skin: Secondary | ICD-10-CM | POA: Diagnosis not present

## 2022-10-03 DIAGNOSIS — R202 Paresthesia of skin: Secondary | ICD-10-CM | POA: Diagnosis not present

## 2022-10-03 DIAGNOSIS — M542 Cervicalgia: Secondary | ICD-10-CM | POA: Diagnosis not present

## 2022-10-03 DIAGNOSIS — G43109 Migraine with aura, not intractable, without status migrainosus: Secondary | ICD-10-CM | POA: Diagnosis not present

## 2022-10-03 MED FILL — BIMZELX AUTOINJECTOR 160 MG/ML SUBCUTANEOUS AUTO-INJECTOR: SUBCUTANEOUS | 28 days supply | Qty: 2 | Fill #1

## 2022-10-03 NOTE — Telephone Encounter (Signed)
Unable to reach pts mom (DPR signed) unsure of which appt to cancel; sending note to lsc support.

## 2022-10-04 ENCOUNTER — Ambulatory Visit: Payer: BC Managed Care – PPO | Admitting: Nurse Practitioner

## 2022-10-04 DIAGNOSIS — M654 Radial styloid tenosynovitis [de Quervain]: Secondary | ICD-10-CM | POA: Diagnosis not present

## 2022-10-08 ENCOUNTER — Ambulatory Visit: Payer: BC Managed Care – PPO | Admitting: Nurse Practitioner

## 2022-10-08 NOTE — H&P (Signed)
HPI:   Courtney [name] is a 32 y.o. female who presents as a return Patient.   Current problem: Draining ears.  HPI: Both ears have been draining pretty consistently for a few months. She had been on powder in the past with Dr. Lovey Newcomer. She also has chronic nasal congestion and obstruction. She had an MRI of the brain recently and it revealed adenoid hypertrophy but no evidence of sinus disease.  PMH/Meds/All/SocHx/FamHx/ROS:   History reviewed. No pertinent past medical history.  History reviewed. No pertinent surgical history.  No family history of bleeding disorders, wound healing problems or difficulty with anesthesia.     Current Outpatient Medications:  albuterol HFA (PROVENTIL HFA;VENTOLIN HFA;PROAIR HFA) 90 mcg/actuation inhaler, TAKE 2 PUFFS BY MOUTH EVERY 6 HOURS AS NEEDED FOR WHEEZE OR SHORTNESS OF BREATH, Disp: , Rfl:  apixaban (Eliquis) 5 mg tab, Take 1 tablet by mouth in the morning and at bedtime., Disp: , Rfl:  ARIPiprazole (ABILIFY) 2 mg tablet, Take 2 mg by mouth daily., Disp: , Rfl:  azelastine (ASTELIN) 137 mcg (0.1 %) nasal spray, PLACE 1 SPRAY INTO BOTH NOSTRILS 2 (TWO) TIMES DAILY. USE IN EACH NOSTRIL AS DIRECTED, Disp: , Rfl:  bifidobacterium infantis (Align) 4 mg capsule, Take 1 capsule by mouth daily., Disp: , Rfl:  bimekizumab-bkzx 160 mg/mL atIn, Inject 320 mg under the skin., Disp: , Rfl:  busPIRone (BUSPAR) 10 mg tablet, Take 1 tablet by mouth 2 (two) times a day., Disp: , Rfl:  Cosentyx Pen 150 mg/mL pnij, Inject the contents of 2 pens (300 mg total) once weekly at weeks 0, 1, 2, 3, and 4. As loading dose., Disp: , Rfl:  diazePAM (VALIUM) 10 mg tablet, TAKE 1 TABLET 30 MINUTES PRIOR TO DERMATOLOGY PROCEDURE, Disp: , Rfl:  diphenhydrAMINE (BENADRYL) 25 mg capsule, Take 25 mg by mouth every 6 (six) hours as needed., Disp: , Rfl:  EPINEPHrine (EPIPEN) 0.3 mg/0.3 mL injection syringe, epinephrine 0.3 mg/0.3 mL injection, auto-injector, Disp: , Rfl:  ertapenem  (INVANZ) 1 gram injection, Infuse 1 g into a venous catheter., Disp: , Rfl:  gabapentin (NEURONTIN) 300 mg capsule, PLEASE SEE ATTACHED FOR DETAILED DIRECTIONS Oral for 30 Days, Disp: , Rfl:  HYDROcodone-acetaminophen (HYCET) 7.5-325 mg/15 mL soln solution, , Disp: , Rfl:  hydrOXYzine (ATARAX) 25 mg tablet, Take 1 tablet by mouth every 8 (eight) hours as needed., Disp: , Rfl:  infliximab (REMICADE IV), , Disp: , Rfl:  iron,carbonyl-vitamin C 100-250 mg tab, Take 1 tablet by mouth daily., Disp: , Rfl:  lamoTRIgine (LaMICtal) 100 mg tablet, TAKE 0.5 TABLETS BY MOUTH 2 TIMES DAILY., Disp: , Rfl:  Lancets misc, See Admin Instructions., Disp: , Rfl:  meloxicam (MOBIC) 7.5 mg tablet, Take 7.5 mg by mouth 2 (two) times a day as needed., Disp: , Rfl:  montelukast (SINGULAIR) 10 mg tablet, Take 1 tablet by mouth nightly., Disp: , Rfl:  norethindrone 0.35 mg tab, Take 1 tablet by mouth daily., Disp: , Rfl:  nystatin (MYCOSTATIN) 100,000 unit/gram powder, Apply topically. to affected area, Disp: , Rfl:  ofloxacin (FLOXIN) 0.3 % otic solution, PLACE 10 DROPS INTO BOTH EARS DAILY., Disp: , Rfl:  olopatadine (PATANOL) 0.1 % ophthalmic solution, , Disp: , Rfl:  rizatriptan (MAXALT) 10 mg tablet, See Admin Instructions. PLEASE SEE ATTACHED FOR DETAILED DIRECTIONS, Disp: , Rfl:  sharps container, Use as directed to dispose of Cosentyx pens., Disp: , Rfl:  SUMAtriptan (IMITREX) 100 mg tablet, Take 100 mg by mouth., Disp: , Rfl:  tinidazole 500 mg tab, ,  Disp: , Rfl:  traMADoL (ULTRAM) 50 mg tablet, Take 50 mg by mouth every 6 (six) hours as needed., Disp: , Rfl:  traZODone (DESYREL) 50 mg tablet, Take 1 tablet by mouth nightly., Disp: , Rfl:  venlafaxine (EFFEXOR) 37.5 mg tablet, Start taking Effexor 37.5 mg daily for one week, then increase to 37.5 mg twice per day for headaches., Disp: , Rfl:    Physical Exam:   Hyponasal voice quality. Nasal mucosa clear. Oral cavity and pharynx are clear. Tonsils are  absent. Ear canals with small amounts of exudate suctioned out on both sides. Perforation on the left is stable. Granulation tissue of the right tympanic membrane.  Independent Review of Additional Tests or Records:  MRI:  IMPRESSION:  1. A few tiny foci of FLAIR signal abnormality in the in the  bilateral frontal white matter are nonspecific but can be seen in  the setting of chronic migraines. The appearance is not typical or  specific for demyelinating disease.  2. Otherwise normal appearance of the brain.  3. Normal intracranial vasculature.  4. Left mastoid effusion with prominent adenoid tonsils which may  reflect hypertrophy. Correlate with physical exam.   Procedures:  none  Impression & Plans:  Chronic draining ears. Recommend we get her back on CAS H powder.  Adenoid hypertrophy with chronic nasal congestion. Recommend adenoidectomy.

## 2022-10-09 ENCOUNTER — Other Ambulatory Visit: Payer: Self-pay

## 2022-10-09 ENCOUNTER — Encounter (HOSPITAL_BASED_OUTPATIENT_CLINIC_OR_DEPARTMENT_OTHER): Payer: Self-pay | Admitting: Otolaryngology

## 2022-10-10 DIAGNOSIS — R451 Restlessness and agitation: Secondary | ICD-10-CM | POA: Diagnosis not present

## 2022-10-10 DIAGNOSIS — M791 Myalgia, unspecified site: Secondary | ICD-10-CM | POA: Diagnosis not present

## 2022-10-10 DIAGNOSIS — N390 Urinary tract infection, site not specified: Secondary | ICD-10-CM | POA: Diagnosis not present

## 2022-10-10 DIAGNOSIS — F419 Anxiety disorder, unspecified: Secondary | ICD-10-CM | POA: Diagnosis not present

## 2022-10-10 DIAGNOSIS — F1990 Other psychoactive substance use, unspecified, uncomplicated: Secondary | ICD-10-CM | POA: Diagnosis not present

## 2022-10-10 DIAGNOSIS — F1510 Other stimulant abuse, uncomplicated: Secondary | ICD-10-CM | POA: Diagnosis not present

## 2022-10-10 DIAGNOSIS — F1410 Cocaine abuse, uncomplicated: Secondary | ICD-10-CM | POA: Diagnosis not present

## 2022-10-11 ENCOUNTER — Ambulatory Visit: Payer: BC Managed Care – PPO | Admitting: Nurse Practitioner

## 2022-10-11 ENCOUNTER — Telehealth: Payer: Self-pay | Admitting: Nurse Practitioner

## 2022-10-11 NOTE — Telephone Encounter (Signed)
Noted. I appreciate the update.

## 2022-10-11 NOTE — Telephone Encounter (Signed)
Pt's mom, [name], called to cancel pt's appt today for adhd f/u. [name] stated the pt has been admitted into rehab for detox & wanted to let Cable know. Call back # [phone]

## 2022-10-12 ENCOUNTER — Telehealth: Payer: Self-pay | Admitting: Nurse Practitioner

## 2022-10-12 DIAGNOSIS — F901 Attention-deficit hyperactivity disorder, predominantly hyperactive type: Secondary | ICD-10-CM

## 2022-10-12 NOTE — Telephone Encounter (Signed)
Prescription Request  10/12/2022  LOV: 05/16/2022  What is the name of the medication or equipment? methylphenidate (RITALIN) 20 MG tablet   Have you contacted your pharmacy to request a refill? No   Which pharmacy would you like this sent to?  [id number] [place], [place] - [address] [place] [address] Phone: [phone] Fax: [phone]     Patient notified that their request is being sent to the clinical staff for review and that they should receive a response within 2 business days.   Please advise at Mobile [phone] (mobile)

## 2022-10-12 NOTE — Telephone Encounter (Signed)
LOV - 05/10/22 NOV - 12/10/22 RF - 05/16/22 #30/0 to fill on 07/05/22

## 2022-10-13 ENCOUNTER — Emergency Department (HOSPITAL_COMMUNITY): Payer: BC Managed Care – PPO

## 2022-10-13 ENCOUNTER — Encounter (HOSPITAL_COMMUNITY): Payer: Self-pay

## 2022-10-13 ENCOUNTER — Emergency Department (HOSPITAL_COMMUNITY)
Admission: EM | Admit: 2022-10-13 | Discharge: 2022-10-13 | Discharge status: Against Medical Advice | Payer: BC Managed Care – PPO | Source: Home / Self Care

## 2022-10-13 ENCOUNTER — Other Ambulatory Visit: Payer: Self-pay

## 2022-10-13 DIAGNOSIS — Z5321 Procedure and treatment not carried out due to patient leaving prior to being seen by health care provider: Secondary | ICD-10-CM | POA: Insufficient documentation

## 2022-10-13 DIAGNOSIS — R1310 Dysphagia, unspecified: Secondary | ICD-10-CM | POA: Diagnosis not present

## 2022-10-13 DIAGNOSIS — R0600 Dyspnea, unspecified: Secondary | ICD-10-CM | POA: Insufficient documentation

## 2022-10-13 DIAGNOSIS — R0602 Shortness of breath: Secondary | ICD-10-CM | POA: Diagnosis not present

## 2022-10-13 NOTE — ED Notes (Addendum)
Pt and pt family was walking out of ED Fast. I'm not sure why. However pt family asked her to check herself out. Pt stopped and said something and then turned around walked out of the doors and up the stairs. The family followed right after.

## 2022-10-13 NOTE — ED Triage Notes (Signed)
Pt came in via POV d/t swelling under her chin area, reports she has a surgery scheduled for this coming Monday in 2 days & she is here in ED reporting that swallowing has became difficult & rates her pain 10/10 pain.

## 2022-10-15 ENCOUNTER — Encounter (HOSPITAL_BASED_OUTPATIENT_CLINIC_OR_DEPARTMENT_OTHER): Payer: Self-pay | Admitting: Otolaryngology

## 2022-10-15 ENCOUNTER — Ambulatory Visit (HOSPITAL_BASED_OUTPATIENT_CLINIC_OR_DEPARTMENT_OTHER): Payer: BC Managed Care – PPO | Admitting: Anesthesiology

## 2022-10-15 ENCOUNTER — Other Ambulatory Visit: Payer: Self-pay | Admitting: Psychiatry

## 2022-10-15 ENCOUNTER — Encounter (HOSPITAL_BASED_OUTPATIENT_CLINIC_OR_DEPARTMENT_OTHER)
Admission: RE | Disposition: A | Discharge status: Home / Self Care | Payer: Self-pay | Source: Home / Self Care | Attending: Otolaryngology

## 2022-10-15 ENCOUNTER — Other Ambulatory Visit: Payer: Self-pay

## 2022-10-15 ENCOUNTER — Ambulatory Visit (HOSPITAL_BASED_OUTPATIENT_CLINIC_OR_DEPARTMENT_OTHER)
Admission: RE | Admit: 2022-10-15 | Discharge: 2022-10-15 | Disposition: A | Discharge status: Home / Self Care | Payer: BC Managed Care – PPO | Attending: Otolaryngology | Admitting: Otolaryngology

## 2022-10-15 DIAGNOSIS — F3181 Bipolar II disorder: Secondary | ICD-10-CM

## 2022-10-15 DIAGNOSIS — J352 Hypertrophy of adenoids: Secondary | ICD-10-CM | POA: Diagnosis not present

## 2022-10-15 DIAGNOSIS — Z01818 Encounter for other preprocedural examination: Secondary | ICD-10-CM

## 2022-10-15 HISTORY — PX: ADENOIDECTOMY: SHX5191

## 2022-10-15 LAB — POCT PREGNANCY, URINE: Preg Test, Ur: NEGATIVE

## 2022-10-15 SURGERY — ADENOIDECTOMY
Anesthesia: General | Site: Throat

## 2022-10-15 MED ORDER — ACETAMINOPHEN 10 MG/ML IV SOLN
1000.0000 mg | Freq: Once | INTRAVENOUS | Status: AC
  Administered 2022-10-15: 1000 mg via INTRAVENOUS

## 2022-10-15 MED ORDER — HYDROMORPHONE HCL 1 MG/ML IJ SOLN
INTRAMUSCULAR | Status: AC
  Filled 2022-10-15: qty 0.5

## 2022-10-15 MED ORDER — FENTANYL CITRATE (PF) 100 MCG/2ML IJ SOLN
INTRAMUSCULAR | Status: AC
  Filled 2022-10-15: qty 2

## 2022-10-15 MED ORDER — HYDROMORPHONE HCL 1 MG/ML IJ SOLN
0.2500 mg | INTRAMUSCULAR | Status: DC | PRN
  Administered 2022-10-15 (×2): 0.5 mg via INTRAVENOUS

## 2022-10-15 MED ORDER — ROCURONIUM BROMIDE 10 MG/ML (PF) SYRINGE
PREFILLED_SYRINGE | INTRAVENOUS | Status: AC
  Filled 2022-10-15: qty 10

## 2022-10-15 MED ORDER — LIDOCAINE HCL (CARDIAC) PF 100 MG/5ML IV SOSY
PREFILLED_SYRINGE | INTRAVENOUS | Status: DC | PRN
  Administered 2022-10-15: 80 mg via INTRAVENOUS

## 2022-10-15 MED ORDER — MEPERIDINE HCL 25 MG/ML IJ SOLN: 6.2500 mg | INTRAMUSCULAR | Status: DC | PRN

## 2022-10-15 MED ORDER — SUCCINYLCHOLINE CHLORIDE 200 MG/10ML IV SOSY
PREFILLED_SYRINGE | INTRAVENOUS | Status: DC | PRN
  Administered 2022-10-15: 120 mg via INTRAVENOUS

## 2022-10-15 MED ORDER — LACTATED RINGERS IV SOLN: INTRAVENOUS | Status: DC

## 2022-10-15 MED ORDER — SUGAMMADEX SODIUM 200 MG/2ML IV SOLN
INTRAVENOUS | Status: DC | PRN
  Administered 2022-10-15: 200 mg via INTRAVENOUS

## 2022-10-15 MED ORDER — MIDAZOLAM HCL 5 MG/5ML IJ SOLN
INTRAMUSCULAR | Status: DC | PRN
  Administered 2022-10-15: 2 mg via INTRAVENOUS

## 2022-10-15 MED ORDER — 0.9 % SODIUM CHLORIDE (POUR BTL) OPTIME
TOPICAL | Status: DC | PRN
Start: 2022-10-15 — End: 2022-10-15
  Administered 2022-10-15: 200 mL

## 2022-10-15 MED ORDER — OXYCODONE HCL 5 MG/5ML PO SOLN: 5.0000 mg | Freq: Once | ORAL | Status: DC | PRN

## 2022-10-15 MED ORDER — OXYCODONE HCL 5 MG PO TABS: 5.0000 mg | ORAL_TABLET | Freq: Once | ORAL | Status: DC | PRN

## 2022-10-15 MED ORDER — PROMETHAZINE HCL 25 MG/ML IJ SOLN: 6.2500 mg | INTRAMUSCULAR | Status: DC | PRN

## 2022-10-15 MED ORDER — ACETAMINOPHEN 10 MG/ML IV SOLN
INTRAVENOUS | Status: AC
  Filled 2022-10-15: qty 100

## 2022-10-15 MED ORDER — PROPOFOL 10 MG/ML IV BOLUS
INTRAVENOUS | Status: DC | PRN
Start: 2022-10-15 — End: 2022-10-15
  Administered 2022-10-15: 150 mg via INTRAVENOUS
  Administered 2022-10-15: 20 mg via INTRAVENOUS

## 2022-10-15 MED ORDER — ONDANSETRON HCL 4 MG/2ML IJ SOLN
INTRAMUSCULAR | Status: AC
  Filled 2022-10-15: qty 2

## 2022-10-15 MED ORDER — OXYMETAZOLINE HCL 0.05 % NA SOLN
NASAL | Status: DC | PRN
Start: 2022-10-15 — End: 2022-10-15
  Administered 2022-10-15: 1 via TOPICAL

## 2022-10-15 MED ORDER — OXYMETAZOLINE HCL 0.05 % NA SOLN
NASAL | Status: AC
  Filled 2022-10-15: qty 30

## 2022-10-15 MED ORDER — ONDANSETRON HCL 4 MG/2ML IJ SOLN
INTRAMUSCULAR | Status: DC | PRN
  Administered 2022-10-15: 4 mg via INTRAVENOUS

## 2022-10-15 MED ORDER — METHYLPHENIDATE HCL 20 MG PO TABS
20.0000 mg | ORAL_TABLET | Freq: Every day | ORAL | 0 refills | Status: DC
Start: 2022-10-15 — End: 2022-11-16

## 2022-10-15 MED ORDER — FENTANYL CITRATE (PF) 100 MCG/2ML IJ SOLN
INTRAMUSCULAR | Status: DC | PRN
  Administered 2022-10-15 (×2): 50 ug via INTRAVENOUS

## 2022-10-15 MED ORDER — AMISULPRIDE (ANTIEMETIC) 5 MG/2ML IV SOLN: 10.0000 mg | Freq: Once | INTRAVENOUS | Status: DC | PRN

## 2022-10-15 MED ORDER — ROCURONIUM BROMIDE 100 MG/10ML IV SOLN
INTRAVENOUS | Status: DC | PRN
  Administered 2022-10-15: 10 mg via INTRAVENOUS
  Administered 2022-10-15: 20 mg via INTRAVENOUS

## 2022-10-15 MED ORDER — DEXAMETHASONE SODIUM PHOSPHATE 10 MG/ML IJ SOLN
INTRAMUSCULAR | Status: AC
  Filled 2022-10-15: qty 1

## 2022-10-15 MED ORDER — MIDAZOLAM HCL 2 MG/2ML IJ SOLN
INTRAMUSCULAR | Status: AC
  Filled 2022-10-15: qty 2

## 2022-10-15 MED ORDER — DEXAMETHASONE SODIUM PHOSPHATE 4 MG/ML IJ SOLN
INTRAMUSCULAR | Status: DC | PRN
  Administered 2022-10-15: 10 mg via INTRAVENOUS

## 2022-10-15 SURGICAL SUPPLY — 30 items
CANISTER SUCT 1200ML W/VALVE (MISCELLANEOUS) ×1 IMPLANT
CATH ROBINSON RED A/P 12FR (CATHETERS) ×1 IMPLANT
COAGULATOR SUCT SWTCH 10FR 6 (ELECTROSURGICAL) ×1 IMPLANT
COVER BACK TABLE 60X90IN (DRAPES) ×1 IMPLANT
COVER MAYO STAND STRL (DRAPES) ×1 IMPLANT
DEFOGGER MIRROR 1QT (MISCELLANEOUS) ×1 IMPLANT
ELECT REM PT RETURN 9FT ADLT (ELECTROSURGICAL) ×1
ELECT REM PT RETURN 9FT PED (ELECTROSURGICAL)
ELECTRODE REM PT RETRN 9FT PED (ELECTROSURGICAL) IMPLANT
ELECTRODE REM PT RTRN 9FT ADLT (ELECTROSURGICAL) IMPLANT
GAUZE SPONGE 4X4 12PLY STRL LF (GAUZE/BANDAGES/DRESSINGS) ×1 IMPLANT
GLOVE BIOGEL PI IND STRL 7.0 (GLOVE) IMPLANT
GLOVE BIOGEL PI IND STRL 7.5 (GLOVE) IMPLANT
GLOVE ECLIPSE 6.5 STRL STRAW (GLOVE) IMPLANT
GLOVE ECLIPSE 7.5 STRL STRAW (GLOVE) ×1 IMPLANT
GLOVE SURG SYN 7.5 E (GLOVE) ×1
GLOVE SURG SYN 7.5 PF PI (GLOVE) IMPLANT
GOWN STRL REUS W/ TWL LRG LVL3 (GOWN DISPOSABLE) ×2 IMPLANT
GOWN STRL REUS W/TWL LRG LVL3 (GOWN DISPOSABLE) ×3
MARKER SKIN DUAL TIP RULER LAB (MISCELLANEOUS) IMPLANT
NS IRRIG 1000ML POUR BTL (IV SOLUTION) ×1 IMPLANT
SHEET MEDIUM DRAPE 40X70 STRL (DRAPES) ×1 IMPLANT
SLEEVE SCD COMPRESS KNEE MED (STOCKING) IMPLANT
SPONGE TONSIL 1 RF SGL (DISPOSABLE) IMPLANT
SPONGE TONSIL 1.25 RF SGL STRG (GAUZE/BANDAGES/DRESSINGS) IMPLANT
SYR BULB EAR ULCER 3OZ GRN STR (SYRINGE) ×1 IMPLANT
TOWEL GREEN STERILE FF (TOWEL DISPOSABLE) ×1 IMPLANT
TUBE CONNECTING 20X1/4 (TUBING) ×1 IMPLANT
TUBE SALEM SUMP 12FR 48 (TUBING) IMPLANT
TUBE SALEM SUMP 16F (TUBING) IMPLANT

## 2022-10-15 NOTE — Interval H&P Note (Signed)
History and Physical Interval Note:  10/15/2022 9:24 AM  Courtney [name]  has presented today for surgery, with the diagnosis of Adenoid hypertrophy.  The various methods of treatment have been discussed with the patient and family. After consideration of risks, benefits and other options for treatment, the patient has consented to  Procedure(s): ADENOIDECTOMY (Bilateral) as a surgical intervention.  The patient's history has been reviewed, patient examined, no change in status, stable for surgery.  I have reviewed the patient's chart and labs.  Questions were answered to the patient's satisfaction.     Serena Colonel

## 2022-10-15 NOTE — Transfer of Care (Signed)
Immediate Anesthesia Transfer of Care Note  Patient: Courtney [name]  Procedure(s) Performed: ADENOIDECTOMY (Throat)  Patient Location: PACU  Anesthesia Type:General  Level of Consciousness: sedated  Airway & Oxygen Therapy: Patient Spontanous Breathing and Patient connected to face mask oxygen  Post-op Assessment: Report given to RN and Post -op Vital signs reviewed and stable  Post vital signs: Reviewed and stable  Last Vitals:  Vitals Value Taken Time  BP 91/59 10/15/22 1043  Temp    Pulse 88 10/15/22 1045  Resp 22 10/15/22 1045  SpO2 100 % 10/15/22 1045    Last Pain:  Vitals:   10/15/22 0847  TempSrc: Tympanic  PainSc: 0-No pain      Patients Stated Pain Goal: 6 (10/15/22 0847)  Complications: No notable events documented.

## 2022-10-15 NOTE — Discharge Instructions (Addendum)
Diet and activities as tolerated.  Tylenol and/or motrin for pain.  Tylenol 1000 mg given at 1115 am   Post Anesthesia Home Care Instructions  Activity: Get plenty of rest for the remainder of the day. A responsible individual must stay with you for 24 hours following the procedure.  For the next 24 hours, DO NOT: -Drive a car -Advertising copywriter -Drink alcoholic beverages -Take any medication unless instructed by your physician -Make any legal decisions or sign important papers.  Meals: Start with liquid foods such as gelatin or soup. Progress to regular foods as tolerated. Avoid greasy, spicy, heavy foods. If nausea and/or vomiting occur, drink only clear liquids until the nausea and/or vomiting subsides. Call your physician if vomiting continues.  Special Instructions/Symptoms: Your throat may feel dry or sore from the anesthesia or the breathing tube placed in your throat during surgery. If this causes discomfort, gargle with warm salt water. The discomfort should disappear within 24 hours.  If you had a scopolamine patch placed behind your ear for the management of post- operative nausea and/or vomiting:  1. The medication in the patch is effective for 72 hours, after which it should be removed.  Wrap patch in a tissue and discard in the trash. Wash hands thoroughly with soap and water. 2. You may remove the patch earlier than 72 hours if you experience unpleasant side effects which may include dry mouth, dizziness or visual disturbances. 3. Avoid touching the patch. Wash your hands with soap and water after contact with the patch.

## 2022-10-15 NOTE — Anesthesia Preprocedure Evaluation (Signed)
Anesthesia Evaluation  Patient identified by MRN, date of birth, ID band Patient awake    Reviewed: Allergy & Precautions, NPO status , Patient's Chart, lab work & pertinent test results  History of Anesthesia Complications Negative for: history of anesthetic complications  Airway Mallampati: II  TM Distance: >3 FB Neck ROM: Full    Dental  (+) Teeth Intact   Pulmonary neg pulmonary ROS   Pulmonary exam normal        Cardiovascular negative cardio ROS Normal cardiovascular exam     Neuro/Psych  Headaches  Anxiety  Bipolar Disorder    negative psych ROS   GI/Hepatic negative GI ROS, Neg liver ROS,,,  Endo/Other  negative endocrine ROS    Renal/GU negative Renal ROS  negative genitourinary   Musculoskeletal negative musculoskeletal ROS (+)    Abdominal  (+) + obese  Peds  (+) ADHD Hematology negative hematology ROS (+)   Anesthesia Other Findings   Reproductive/Obstetrics                             Anesthesia Physical Anesthesia Plan  ASA: II  Anesthesia Plan: General   Post-op Pain Management:    Induction: Intravenous  PONV Risk Score and Plan: 3 and Ondansetron, Dexamethasone, Treatment may vary due to age or medical condition and Midazolam  Airway Management Planned: Oral ETT  Additional Equipment: None  Intra-op Plan:   Post-operative Plan: Extubation in OR  Informed Consent: I have reviewed the patients History and Physical, chart, labs and discussed the procedure including the risks, benefits and alternatives for the proposed anesthesia with the patient or authorized representative who has indicated his/her understanding and acceptance.     Dental advisory given  Plan Discussed with:   Anesthesia Plan Comments:         Anesthesia Quick Evaluation

## 2022-10-15 NOTE — Anesthesia Procedure Notes (Signed)
Procedure Name: Intubation Date/Time: 10/15/2022 10:04 AM  Performed by: Burna Cash, CRNAPre-anesthesia Checklist: Patient identified, Emergency Drugs available, Suction available and Patient being monitored Patient Re-evaluated:Patient Re-evaluated prior to induction Oxygen Delivery Method: Circle system utilized Preoxygenation: Pre-oxygenation with 100% oxygen Induction Type: IV induction Ventilation: Mask ventilation without difficulty Laryngoscope Size: Mac and 3 Grade View: Grade I Tube type: Oral Tube size: 7.0 mm Number of attempts: 1 Airway Equipment and Method: Stylet and Oral airway Placement Confirmation: ETT inserted through vocal cords under direct vision, positive ETCO2 and breath sounds checked- equal and bilateral Secured at: 20 cm Tube secured with: Tape Dental Injury: Teeth and Oropharynx as per pre-operative assessment

## 2022-10-15 NOTE — Anesthesia Postprocedure Evaluation (Signed)
Anesthesia Post Note  Patient: Courtney [name]  Procedure(s) Performed: ADENOIDECTOMY (Throat)     Patient location during evaluation: PACU Anesthesia Type: General Level of consciousness: awake and alert and oriented Pain management: pain level controlled Vital Signs Assessment: post-procedure vital signs reviewed and stable Respiratory status: spontaneous breathing, nonlabored ventilation and respiratory function stable Cardiovascular status: blood pressure returned to baseline and stable Postop Assessment: no apparent nausea or vomiting Anesthetic complications: no   No notable events documented.  Last Vitals:  Vitals:   10/15/22 1100 10/15/22 1115  BP: 97/71 105/74  Pulse: 84 88  Resp: 18 19  Temp:    SpO2: 96% 95%    Last Pain:  Vitals:   10/15/22 1115  TempSrc:   PainSc: 5                  , A.

## 2022-10-15 NOTE — Telephone Encounter (Signed)
Refill sent in

## 2022-10-15 NOTE — Op Note (Signed)
10/15/2022  10:32 AM  PATIENT:  Courtney [name]  32 y.o. female  PRE-OPERATIVE DIAGNOSIS:  Adenoid hypertrophy  POST-OPERATIVE DIAGNOSIS:  Adenoid hypertrophy  PROCEDURE:  Procedure(s): ADENOIDECTOMY  SURGEON:  Surgeon(s): Serena Colonel, MD  ANESTHESIA:   General  COUNTS:  Correct   DICTATION: The patient was taken to the operating room and placed on the operating table in the supine position. Following induction of general endotracheal anesthesia, the table was turned and the patient was draped in a standard fashion. A Crowe-Davis mouthgag was inserted into the oral cavity and used to retract the tongue and mandible, then attached to the Mayo stand. Indirect exam of the nasopharynx reveals very large and papillary type adenoid tissue extending into the posterior nasal cavities. Fragments were taken for pathologic evaluation. . Adenoidectomy was performed using suction cautery to ablate the lymphoid tissue in the nasopharynx. The adenoidal tissue was ablated down to the level of the nasopharyngeal mucosa. Afrin sponges were used for hemostasis.  The pharynx was irrigated with saline and suctioned. An oral gastric tube was used to aspirate the contents of the stomach. The patient was then awakened from anesthesia and transferred to PACU in stable condition.   PATIENT DISPOSITION:  To PACU, stable

## 2022-10-16 ENCOUNTER — Encounter (HOSPITAL_BASED_OUTPATIENT_CLINIC_OR_DEPARTMENT_OTHER): Payer: Self-pay | Admitting: Otolaryngology

## 2022-10-22 NOTE — Unmapped (Signed)
Fill 3 out of 4 for the loading dose    [facility] Refill Coordination Note    Specialty Medication(s) to be Shipped:   Inflammatory Disorders: Bimzelx    Other medication(s) to be shipped: No additional medications requested for fill at this time     Melissa Pearson, DOB: 1990-12-01  Phone: [phone] (home) [phone] (work)      All above HIPAA information was verified with patient.     Was a Nurse, learning disability used for this call? No    Completed refill call assessment today to schedule patient's medication shipment from the [facility] [phone]).  All relevant notes have been reviewed.     Specialty medication(s) and dose(s) confirmed: Regimen is correct and unchanged.   Changes to medications: Vaughn reports no changes at this time.  Changes to insurance: No  New side effects reported not previously addressed with a pharmacist or physician: None reported  Questions for the pharmacist: No    Confirmed patient received a Conservation officer, historic buildings and a Surveyor, mining with first shipment. The patient will receive a drug information handout for each medication shipped and additional FDA Medication Guides as required.       DISEASE/MEDICATION-SPECIFIC INFORMATION        For patients on injectable medications: Patient currently has 0 doses left.  Next injection is scheduled for 10/26/2022.    SPECIALTY MEDICATION ADHERENCE              Were doses missed due to medication being on hold? No    Bimzelx 160 mg/ml: 0 doses of medicine on hand       REFERRAL TO PHARMACIST     Referral to the pharmacist: Not needed      SHIPPING     Shipping address confirmed in Epic.       Delivery Scheduled: Yes, Expected medication delivery date: 10/24/2022.     Medication will be delivered via UPS to the prescription address in Epic WAM.    Elnora Morrison, PharmD   [facility] Specialty Pharmacist

## 2022-10-23 NOTE — Unmapped (Signed)
Levee Garrod 's [facility] AUTOINJECTOR 160 mg/mL Atin (bimekizumab-bkzx) shipment will be delayed as a result of the medication is too soon to refill until 10/27/22.     I have reached out to the patient  at [phone]  and left a voicemail message.  We will wait for a call back from the patient to reschedule the delivery.  We have not confirmed the new delivery date.

## 2022-10-24 ENCOUNTER — Ambulatory Visit
Admission: EM | Admit: 2022-10-24 | Discharge: 2022-10-24 | Disposition: A | Discharge status: Home / Self Care | Payer: BC Managed Care – PPO | Attending: Family Medicine | Admitting: Family Medicine

## 2022-10-24 DIAGNOSIS — L732 Hidradenitis suppurativa: Principal | ICD-10-CM

## 2022-10-24 DIAGNOSIS — R070 Pain in throat: Secondary | ICD-10-CM

## 2022-10-24 MED ORDER — HYDROCODONE-ACETAMINOPHEN 7.5-325 MG/15ML PO SOLN: ORAL | 0 refills | Status: DC

## 2022-10-24 NOTE — ED Triage Notes (Signed)
Pt c/o sore throat, pt states she had her adenoids taken out last week she has been okay until today now the front part of her throat hurts any time she swallows.

## 2022-10-24 NOTE — Discharge Instructions (Signed)
Be aware, you have been prescribed pain medications that may cause drowsiness. While taking this medication, do not take any other medications containing acetaminophen (Tylenol). Do not combine with alcohol or recreational drugs. Please do not drive, operate heavy machinery, or take part in activities that require making important decisions while on this medication as your judgement may be clouded.  

## 2022-10-25 ENCOUNTER — Ambulatory Visit
Admission: EM | Admit: 2022-10-25 | Discharge: 2022-10-25 | Disposition: A | Discharge status: Home / Self Care | Payer: BC Managed Care – PPO | Attending: Emergency Medicine | Admitting: Emergency Medicine

## 2022-10-25 ENCOUNTER — Ambulatory Visit (INDEPENDENT_AMBULATORY_CARE_PROVIDER_SITE_OTHER): Payer: BC Managed Care – PPO

## 2022-10-25 DIAGNOSIS — M79644 Pain in right finger(s): Secondary | ICD-10-CM | POA: Diagnosis not present

## 2022-10-25 DIAGNOSIS — S6701XA Crushing injury of right thumb, initial encounter: Secondary | ICD-10-CM | POA: Diagnosis not present

## 2022-10-25 MED ORDER — PREDNISONE 20 MG PO TABS: 40.0000 mg | ORAL_TABLET | Freq: Every day | ORAL | 0 refills | Status: DC

## 2022-10-25 MED ORDER — OXYCODONE HCL 5 MG PO TABS
5.0000 mg | ORAL_TABLET | Freq: Four times a day (QID) | ORAL | 0 refills | Status: DC | PRN
Start: 2022-10-25 — End: 2023-01-17

## 2022-10-25 NOTE — ED Notes (Signed)
Patient has to leave prior to x-ray results due to other commitments. Educated on restrictions prior to x-ray results. Verbalized understanding. Verbalizes understanding that she may need to return for splinting based on x-ray results.

## 2022-10-25 NOTE — ED Provider Notes (Signed)
Renaldo Fiddler    CSN: [id number] Arrival date & time: 10/25/22  1624      History   Chief Complaint Chief Complaint  Patient presents with   Finger Injury    HPI Courtney [name] is a 32 y.o. female.   Patient presents for evaluation of right thumb injury that occurred within the hour.  Finger was smashed in a car door.  Has lost complete sensation to the right thumb as well as has loss movement.  Bruising present to the nailbed.  Has not attempted treatment.  Past Medical History:  Diagnosis Date   ADHD (attention deficit hyperactivity disorder)    Bipolar disorder (HCC)    Chronic tonsillitis 08/2013   current strep, will finish antibiotic 09/04/2013; snores during sleep, mother denies apnea   Heart murmur    "when I was born; outgrew it; I was a preemie"   Hidradenitis    Hidradenitis 2017   Migraine 12/19/2016   "I've just had this one for 3 days" (12/20/2016)   Pulmonary emboli (HCC) 07/02/2021   BL   Septic shock (HCC) 07/02/2021    Patient Active Problem List   Diagnosis Date Noted   Otitis externa of both ears 05/16/2022   Chronic rhinitis 02/15/2022   Shellfish allergy 02/15/2022   Pulmonary embolism (HCC) 07/19/2021   Hypotension 07/11/2021   Trichomonas vaginitis 07/05/2021   Acute metabolic encephalopathy 07/05/2021   Bilateral pulmonary embolism (HCC) 07/05/2021   Anxiety disorder 06/28/2021   Prediabetes 05/25/2021   Allergy to adhesive tape 05/25/2021   Hidradenitis suppurativa 05/23/2021   Insomnia 03/27/2021   Anemia 12/21/2020   Hypoglycemia 11/17/2020   Bipolar disorder, in full remission, most recent episode mixed (HCC) 10/07/2020   Bipolar disorder, in full remission, most recent episode depressed (HCC) 03/03/2020   Bipolar 2 disorder, major depressive episode (HCC) 08/04/2019   At risk for long QT syndrome 08/04/2019   Obesity (BMI 35.0-39.9 without comorbidity) 03/19/2019   Reactive airway disease 02/11/2019   Conductive hearing  loss 03/13/2018   Axillary hidradenitis suppurativa 04/03/2017   Vitamin D deficiency 10/18/2014   Attention deficit hyperactivity disorder (ADHD), predominantly hyperactive type 02/11/2012    Past Surgical History:  Procedure Laterality Date   ADENOIDECTOMY N/A 10/15/2022   Procedure: ADENOIDECTOMY;  Surgeon: Serena Colonel, MD;  Location: [facility];  Service: ENT;  Laterality: N/A;   AXILLARY HIDRADENITIS EXCISION     AXILLARY LYMPH NODE BIOPSY Left 01/06/2019   CYSTOSCOPY W/ URETERAL STENT PLACEMENT Right 05/28/2017   Procedure: CYSTOSCOPY WITH RETROGRADE PYELOGRAM/ RIGHT URETERAL STENT PLACEMENT;  Surgeon: Crist Fat, MD;  Location: [facility];  Service: Urology;  Laterality: Right;   HYDRADENITIS EXCISION Bilateral 04/03/2017   Procedure: EXCISION AND DRAINAGE BILATERAL  HIDRADENITIS AXILLA;  Surgeon: Griselda Miner, MD;  Location: [facility] OR;  Service: General;  Laterality: Bilateral;   INCISION AND DRAINAGE ABSCESS Right 07/20/2016   Procedure: INCISION AND DRAINAGE ABSCESS right axilla;  Surgeon: Abigail Miyamoto, MD;  Location: [facility] OR;  Service: General;  Laterality: Right;   INTRAUTERINE DEVICE (IUD) INSERTION  11/2016   "had the one in my left arm removed"   IRRIGATION AND DEBRIDEMENT ABSCESS Right 12/21/2016   Procedure: IRRIGATION AND DEBRIDEMENT RIGHT AXILLARY HIDRADENITIS;  Surgeon: Andria Meuse, MD;  Location: [facility] OR;  Service: General;  Laterality: Right;   IRRIGATION AND DEBRIDEMENT ABSCESS Left 01/08/2017   Procedure: IRRIGATION AND DEBRIDEMENT AXILLARY ABSCESS;  Surgeon: Griselda Miner, MD;  Location: [facility] OR;  Service: General;  Laterality: Left;   NASAL SEPTOPLASTY W/ TURBINOPLASTY Bilateral 02/25/2019   Procedure: NASAL SEPTOPLASTY WITH TURBINATE REDUCTION;  Surgeon: Serena Colonel, MD;  Location: [facility];  Service: ENT;  Laterality: Bilateral;   TONSILLECTOMY Bilateral 09/14/2013   Procedure: BILATERAL TONSILLECTOMY;  Surgeon: Serena Colonel, MD;  Location: [facility];  Service: ENT;  Laterality: Bilateral;   TYMPANOPLASTY Bilateral    "rebuilt eardrums"   WISDOM TOOTH EXTRACTION      OB History   No obstetric history on file.      Home Medications    Prior to Admission medications   Medication Sig Start Date End Date Taking? Authorizing Provider  albuterol (VENTOLIN HFA) 108 (90 Base) MCG/ACT inhaler TAKE 2 PUFFS BY MOUTH EVERY 6 HOURS AS NEEDED FOR WHEEZE OR SHORTNESS OF BREATH 07/05/22   Eden Emms, NP  ARIPiprazole (ABILIFY) 2 MG tablet Take by mouth.    [provider]  bifidobacterium infantis (ALIGN) capsule Take 1 capsule by mouth daily.    [provider]  busPIRone (BUSPAR) 10 MG tablet TAKE 1 TABLET BY MOUTH TWICE A DAY 02/06/22   Jomarie Longs, MD  celecoxib (CELEBREX) 200 MG capsule Take 1 capsule (200 mg total) by mouth 2 (two) times daily. 09/08/22   Arthor Captain, PA-C  diclofenac Sodium (VOLTAREN) 1 % GEL Apply 2 g topically 4 (four) times daily. 09/08/22   Arthor Captain, PA-C  diphenhydrAMINE (BENADRYL) 25 mg capsule 25 mg every 6 (six) hours as needed.    [provider]  EPINEPHrine (EPIPEN 2-PAK) 0.3 mg/0.3 mL IJ SOAJ injection Inject 0.3 mg into the muscle as needed for anaphylaxis. 02/15/22   Eden Emms, NP  glucose blood (CONTOUR TEST) test strip And lancets #100 05/18/21   Romero Belling, MD  HYDROcodone-acetaminophen (HYCET) 7.5-325 mg/15 ml solution Take 5mL every six hours as needed for pain. 10/24/22   Mardella Layman, MD  hydrOXYzine (ATARAX) 25 MG tablet TAKE 1 TABLET BY MOUTH EVERY 8 HOURS AS NEEDED FOR ITCHING. 01/16/22   Worthy Rancher B, FNP  inFLIXimab (REMICADE IV) Inject 1 Dose into the vein every 30 (thirty) days.    [provider]  Iron-Vitamin C 100-250 MG TABS Take 1 tablet by mouth daily.    [provider]  lamoTRIgine (LAMICTAL) 100 MG tablet TAKE 1/2 TABLET TWICE A DAY BY MOUTH 10/15/22   Jomarie Longs, MD   methylphenidate (RITALIN) 20 MG tablet Take 0.5 tablets (10 mg total) by mouth daily as needed. 02/15/22   Eden Emms, NP  methylphenidate (RITALIN) 20 MG tablet Take 1 tablet (20 mg total) by mouth daily before breakfast. 05/16/22 06/15/22  Eden Emms, NP  methylphenidate (RITALIN) 20 MG tablet Take 1 tablet (20 mg total) by mouth daily before breakfast. NEED OFFICE VISIT FOR FURTHER REFILLS 05/16/22 06/15/22  Eden Emms, NP  methylphenidate (RITALIN) 20 MG tablet Take 1 tablet (20 mg total) by mouth daily before breakfast. 10/15/22   Eden Emms, NP  Microlet Lancets MISC See admin instructions. 05/18/21   [provider]  montelukast (SINGULAIR) 10 MG tablet TAKE 1 TABLET BY MOUTH EVERYDAY AT BEDTIME 05/16/22   Eden Emms, NP  norethindrone (MICRONOR) 0.35 MG tablet Take 1 tablet by mouth daily. 07/19/21   [provider]  ondansetron (ZOFRAN) 4 MG tablet Take 1 tablet (4 mg total) by mouth every 8 (eight) hours as needed for nausea or vomiting. 08/21/22   Leath-Warren, Lorene Dy  J, NP  ondansetron (ZOFRAN-ODT) 8 MG disintegrating tablet Take 1 tablet (8 mg total) by mouth every 8 (eight) hours as needed for vomiting or nausea. 06/27/22   Molpus, John, MD  traZODone (DESYREL) 50 MG tablet TAKE 1 TABLET BY MOUTH EVERYDAY AT BEDTIME 11/10/21   Gweneth Dimitri, MD    Family History Family History  Problem Relation Age of Onset   Hypertension Mother    Hyperlipidemia Mother    Diabetes Mother    Hyperlipidemia Father    Heart attack Father 58   Alcohol abuse Paternal Uncle    Parkinson's disease Maternal Grandmother    Dementia Maternal Grandmother    Diabetes Maternal Grandmother    Bell's palsy Maternal Grandmother     Social History Social History   Tobacco Use   Smoking status: Never   Smokeless tobacco: Never  Vaping Use   Vaping status: Former   Quit date: 07/03/2021   Substances: CBD  Substance Use Topics   Alcohol use: Not Currently    Comment: once  a month   Drug use: Never     Allergies   Other, Shellfish allergy, Tapentadol, Tape, Ginger, Morphine and codeine, and Vancomycin   Review of Systems Review of Systems   Physical Exam Triage Vital Signs ED Triage Vitals [10/25/22 1628]  Encounter Vitals Group     BP      Systolic BP Percentile      Diastolic BP Percentile      Pulse      Resp      Temp      Temp src      SpO2      Weight      Height      Head Circumference      Peak Flow      Pain Score 9     Pain Loc      Pain Education      Exclude from Growth Chart    No data found.  Updated Vital Signs LMP 04/05/2022 (Exact Date) Comment: bcp. UPT negative  Visual Acuity Right Eye Distance:   Left Eye Distance:   Bilateral Distance:    Right Eye Near:   Left Eye Near:    Bilateral Near:     Physical Exam Constitutional:      Appearance: Normal appearance.  Eyes:     Extraocular Movements: Extraocular movements intact.  Pulmonary:     Effort: Pulmonary effort is normal.  Musculoskeletal:     Comments: Tenderness present at the first metacarpal and at the scaphoid bone of the right hand, decreased sensation beginning at the proximal phalanx extending to the distal phalanx of the right thumb, generalized swelling, worsened at the distal phalanx, ecchymosis at the distal phalanx along the proximal nailbed, capillary refill less than 3, 2+ radial pulse  Neurological:     Mental Status: She is alert and oriented to person, place, and time. Mental status is at baseline.      UC Treatments / Results  Labs (all labs ordered are listed, but only abnormal results are displayed) Labs Reviewed - No data to display  EKG   Radiology No results found.  Procedures Procedures (including critical care time)  Medications Ordered in UC Medications - No data to display  Initial Impression / Assessment and Plan / UC Course  I have reviewed the triage vital signs and the nursing notes.  Pertinent labs &  imaging results that were available during my care of the patient  were reviewed by me and considered in my medical decision making (see chart for details).  Injury of the right thumb, initial encounter  X-ray negative, reported to patient via telephone, 2 patient identifiers used, verbalized understanding, has hand and wrist brace available at home, to be used as needed for stability and support, prescribed prednisone and oxycodone for management of pain, PDMP reviewed, low risk, recommend ice and heat, elevation and activity as tolerated, advise follow-up with orthopedics if no improvement seen in symptoms in 2 weeks Final Clinical Impressions(s) / UC Diagnoses   Final diagnoses:  None   Discharge Instructions   None    ED Prescriptions   None    PDMP not reviewed this encounter.   Valinda Hoar, NP 10/25/22 [phone]

## 2022-10-25 NOTE — ED Triage Notes (Addendum)
Patient to Urgent Care with complaints of a right sided thumb injury that occurred this afternoon. Reports she shut a car door on her thumb. States another person at the gas station had to open the car door to remove her thumb.   Unable to completely bend her thumb/ loss of sensation in her thumb. Provided ice pack during triage and able to feel cold.   TDAP 2018.

## 2022-10-25 NOTE — Unmapped (Signed)
Melissa Pearson 's [facility] AUTOINJECTOR 160 mg/mL Atin (bimekizumab-bkzx) shipment will be rescheduled as a result of the medication is too soon to refill until 10/27/22.     I have spoken with the patient  at [phone]  and communicated the delivery change. We will reschedule the medication for the delivery date that the patient agreed upon.  We have confirmed the delivery date as 10/30/22

## 2022-10-27 NOTE — ED Provider Notes (Signed)
[facility]   [id number] 10/24/22 Arrival Time: 1815  ASSESSMENT & PLAN:  1. Throat pain    No signs of peritonsillar abscess. Discussed. Will call her ENT surgeon tomorrow to discuss the next step. Tolerating PO fluids.  Meds ordered this encounter  Medications   HYDROcodone-acetaminophen (HYCET) 7.5-325 mg/15 ml solution    Sig: Take 5mL every six hours as needed for pain.    Dispense:  30 mL    Refill:  0   [place] Controlled Substances Registry consulted for this patient. I feel the risk/benefit ratio today is favorable for proceeding with this prescription for a controlled substance. Medication sedation precautions given.     Discharge Instructions      Be aware, you have been prescribed pain medications that may cause drowsiness. While taking this medication, do not take any other medications containing acetaminophen (Tylenol). Do not combine with alcohol or recreational drugs. Please do not drive, operate heavy machinery, or take part in activities that require making important decisions while on this medication as your judgement may be clouded.    Reviewed expectations re: course of current medical issues. Questions answered. Outlined signs and symptoms indicating need for more acute intervention. Patient verbalized understanding. After Visit Summary given.   SUBJECTIVE:  Courtney [name] is a 32 y.o. female who c/o sore throat, pt states she had her adenoids taken out last week she has been okay until today now the front part of her throat hurts any time she swallows.  Denies fever.   OBJECTIVE:  Vitals:   10/24/22 1817  BP: 112/69  Pulse: 94  Resp: 13  Temp: 98.2 F (36.8 C)  TempSrc: Oral  SpO2: 98%     General appearance: alert; no distress HEENT: throat with moderate erythema; uvula is midline; no obvious signs of infection Neck: supple with FROM; no lymphadenopathy Lungs: speaks full sentences without difficulty; unlabored Abd: soft;  non-tender Skin: reveals no rash; warm and dry Psychological: alert and cooperative; normal mood and affect  Allergies  Allergen Reactions   Other Hives    ABD pad causes rash Pt can only tolerate cloth tape Pt can only tolerate cloth tape   Shellfish Allergy Hives   Tapentadol Hives and Other (See Comments)    Pt can only tolerate cloth tape   Tape Hives and Other (See Comments)    Pt can only tolerate cloth tape   Ginger Hives   Morphine And Codeine     " I become very angry"   Vancomycin Other (See Comments)    Burns her vein really bad and always causes them to blow.    Past Medical History:  Diagnosis Date   ADHD (attention deficit hyperactivity disorder)    Bipolar disorder (HCC)    Chronic tonsillitis 08/2013   current strep, will finish antibiotic 09/04/2013; snores during sleep, mother denies apnea   Heart murmur    "when I was born; outgrew it; I was a preemie"   Hidradenitis    Hidradenitis 2017   Migraine 12/19/2016   "I've just had this one for 3 days" (12/20/2016)   Pulmonary emboli (HCC) 07/02/2021   BL   Septic shock (HCC) 07/02/2021   Social History   Socioeconomic History   Marital status: Single    Spouse name: Not on file   Number of children: Not on file   Years of education: EMS/Fire training   Highest education level: Not on file  Occupational History   Not on file  Tobacco Use   Smoking status: Never   Smokeless tobacco: Never  Vaping Use   Vaping status: Former   Quit date: 07/03/2021   Substances: CBD  Substance and Sexual Activity   Alcohol use: Not Currently    Comment: once a month   Drug use: Not Currently    Types: Methamphetamines, Cocaine    Comment: opioids   Sexual activity: Yes    Birth control/protection: Pill, Condom  Other Topics Concern   Not on file  Social History Narrative   02/11/19   From: the area   Living: with Mom currently, working to find her own place   Work: EMS/Fire      Family: good relationship  with Mom and Grandma      Enjoys: spend time with friends/family, hunting, bomb fire      Exercise: every shift does training - 2-3 times a week   Diet: well rounded, meat heavy      Safety   Seat belts: Yes    Guns: Yes  and secure   Safe in relationships: Yes    Helmet: no    Social Determinants of Health   Financial Resource Strain: Low Risk  (02/11/2019)   Overall Financial Resource Strain (CARDIA)    Difficulty of Paying Living Expenses: Not hard at all  Food Insecurity: Low Risk  (07/05/2022)   [facility]   Food vital sign    Within the past 12 months, you worried that your food would run out before you got money to buy more: Never true    Within the past 12 months, the food you bought just didn't last and you didn't have money to get more. : Never true  Transportation Needs: Not on file (07/05/2022)  Physical Activity: Not on file  Stress: Not on file  Social Connections: Unknown (07/02/2021)   [facility] from [facility]   Social Network    Social Network: Not on file  Intimate Partner Violence: Unknown (06/09/2021)   Received from [facility]   HITS    Physically Hurt: Not on file    Insult or Talk Down To: Not on file    Threaten Physical Harm: Not on file    Scream or Curse: Not on file   Family History  Problem Relation Age of Onset   Hypertension Mother    Hyperlipidemia Mother    Diabetes Mother    Hyperlipidemia Father    Heart attack Father 3   Alcohol abuse Paternal Uncle    Parkinson's disease Maternal Grandmother    Dementia Maternal Grandmother    Diabetes Maternal Grandmother    Bell's palsy Maternal Dulce Sellar, MD 10/27/22 1410

## 2022-10-29 ENCOUNTER — Ambulatory Visit: Admit: 2022-10-29 | Discharge: 2022-10-30 | Payer: PRIVATE HEALTH INSURANCE

## 2022-10-29 DIAGNOSIS — L732 Hidradenitis suppurativa: Principal | ICD-10-CM

## 2022-10-29 LAB — CBC W/ AUTO DIFF
BASOPHILS ABSOLUTE COUNT: 0.1 10*9/L (ref 0.0–0.1)
BASOPHILS RELATIVE PERCENT: 0.8 %
EOSINOPHILS ABSOLUTE COUNT: 0.2 10*9/L (ref 0.0–0.5)
EOSINOPHILS RELATIVE PERCENT: 1.2 %
HEMATOCRIT: 34.8 % (ref 34.0–44.0)
HEMOGLOBIN: 11.6 g/dL (ref 11.3–14.9)
LYMPHOCYTES ABSOLUTE COUNT: 6.7 10*9/L — ABNORMAL HIGH (ref 1.1–3.6)
LYMPHOCYTES RELATIVE PERCENT: 39.3 %
MEAN CORPUSCULAR HEMOGLOBIN CONC: 33.3 g/dL (ref 32.0–36.0)
MEAN CORPUSCULAR HEMOGLOBIN: 28.9 pg (ref 25.9–32.4)
MEAN CORPUSCULAR VOLUME: 86.8 fL (ref 77.6–95.7)
MEAN PLATELET VOLUME: 7.6 fL (ref 6.8–10.7)
MONOCYTES ABSOLUTE COUNT: 1.1 10*9/L — ABNORMAL HIGH (ref 0.3–0.8)
MONOCYTES RELATIVE PERCENT: 6.7 %
NEUTROPHILS ABSOLUTE COUNT: 8.8 10*9/L — ABNORMAL HIGH (ref 1.8–7.8)
NEUTROPHILS RELATIVE PERCENT: 52 %
PLATELET COUNT: 345 10*9/L (ref 150–450)
RED BLOOD CELL COUNT: 4.01 10*12/L (ref 3.95–5.13)
RED CELL DISTRIBUTION WIDTH: 14.4 % (ref 12.2–15.2)
WBC ADJUSTED: 16.9 10*9/L — ABNORMAL HIGH (ref 3.6–11.2)

## 2022-10-29 LAB — CREATININE
CREATININE: 0.98 mg/dL
EGFR CKD-EPI (2021) FEMALE: 80 mL/min/{1.73_m2} (ref >=60)

## 2022-10-29 LAB — ALT: ALT (SGPT): 18 U/L (ref 10–49)

## 2022-10-29 LAB — SEDIMENTATION RATE: ERYTHROCYTE SEDIMENTATION RATE: 83 mm/h — ABNORMAL HIGH (ref 0–20)

## 2022-10-29 LAB — C-REACTIVE PROTEIN: C-REACTIVE PROTEIN: 23 mg/L — ABNORMAL HIGH (ref <=10.0)

## 2022-10-29 LAB — BUN: BLOOD UREA NITROGEN: 10 mg/dL (ref 9–23)

## 2022-10-29 LAB — AST: AST (SGOT): 19 U/L (ref <=34)

## 2022-10-29 MED ADMIN — acetaminophen (TYLENOL) tablet 650 mg: 650 mg | ORAL | @ 15:00:00 | Stop: 2022-10-29

## 2022-10-29 MED ADMIN — diphenhydrAMINE (BENADRYL) capsule/tablet 25 mg: 25 mg | ORAL | @ 15:00:00 | Stop: 2022-10-29

## 2022-10-29 MED ADMIN — cetirizine (ZYRTEC) tablet 10 mg: 10 mg | ORAL | @ 15:00:00 | Stop: 2022-10-29

## 2022-10-29 MED ADMIN — inFLIXimab-axxq (AVSOLA) 10 mg/kg = 900 mg in sodium chloride (NS) 250 mL IVPB: 10 mg/kg | INTRAVENOUS | @ 15:00:00 | Stop: 2022-10-29

## 2022-10-29 MED FILL — BIMZELX AUTOINJECTOR 160 MG/ML SUBCUTANEOUS AUTO-INJECTOR: SUBCUTANEOUS | 28 days supply | Qty: 2 | Fill #2

## 2022-10-29 NOTE — Unmapped (Signed)
Addended by: Blanchard Kelch on: 10/29/2022 04:01 PM     Modules accepted: Orders

## 2022-10-29 NOTE — Unmapped (Signed)
Pt presents to infusion center today for scheduled Infliximab infusion. Pt alert, oriented, in NAD.   Pre-meds given per therapy plan orders.   #22 PIV placed to LFA, labs collected per orders, hand delivered for analysis.   @1107 : Infliximab-axxq 10 mg/kg infusion initiated at following rates:   10 ml/hr x 15 minutes  20 ml/hr x 15 minutes  40 ml/hr x 15 minutes  80 ml/hr x 15 minutes  150 ml/hr x 30 minutes  250 ml/hr x for the remainder of infusion.   @1307 : infusion completed without complications. Pt remains VSS, afebrile.   Line care provided with positive blood return; PIV flushed, discontinued.   Pt discharged from clinic in NAD, in stable condition.

## 2022-10-29 NOTE — Unmapped (Signed)
Addended by: Blanchard Kelch on: 10/29/2022 04:09 PM     Modules accepted: Orders

## 2022-11-01 ENCOUNTER — Encounter: Payer: Self-pay | Admitting: Nurse Practitioner

## 2022-11-01 ENCOUNTER — Ambulatory Visit (INDEPENDENT_AMBULATORY_CARE_PROVIDER_SITE_OTHER): Payer: BC Managed Care – PPO | Admitting: Nurse Practitioner

## 2022-11-01 VITALS — BP 100/72 | HR 100 | Temp 97.8°F | Ht 61.0 in | Wt 196.2 lb

## 2022-11-01 DIAGNOSIS — F1510 Other stimulant abuse, uncomplicated: Secondary | ICD-10-CM | POA: Insufficient documentation

## 2022-11-01 DIAGNOSIS — F901 Attention-deficit hyperactivity disorder, predominantly hyperactive type: Secondary | ICD-10-CM | POA: Diagnosis not present

## 2022-11-01 DIAGNOSIS — Z91013 Allergy to seafood: Secondary | ICD-10-CM

## 2022-11-01 MED ORDER — ONDANSETRON HCL 4 MG PO TABS: 4.0000 mg | ORAL_TABLET | Freq: Three times a day (TID) | ORAL | 0 refills | Status: DC | PRN

## 2022-11-01 MED ORDER — EPINEPHRINE 0.3 MG/0.3ML IJ SOAJ: 0.3000 mg | INTRAMUSCULAR | 0 refills | Status: AC | PRN

## 2022-11-01 NOTE — Assessment & Plan Note (Signed)
Currently maintained on methylphenidate 20 mg daily and 10 mg as needed.  Pending urine drug screen today.  Patient should have enough medicine to last until lab results.

## 2022-11-01 NOTE — Progress Notes (Signed)
   Established Patient Office Visit  Subjective   Patient ID: Courtney [name], female    DOB: [birth year]  Age: 32 y.o. MRN: [id number]  Chief Complaint  Patient presents with   Medication Refill    Pt needs refill for Epinephrine and Zofran. Pt wants flu shot.       ADD/ADHD: Methylphenadiate 20mg  daily and then 10mg  as she needs.  Patient did mention that she stopped taking her prescribed medications when she was using the illicit substances.  Substance use disorder: states that she did go to the ED and [facility] States that the program was 4 weeks and she said for 2 weeks. States that the last use was prior to being admitted.  Patient states she is going to Narcotics Anonymous meetings currently.  Patient states that she just got mixed in with the wrong crowd    Review of Systems  Constitutional:  Negative for chills and fever.  Respiratory:  Negative for shortness of breath.   Cardiovascular:  Negative for chest pain.  Neurological:  Negative for headaches.  Psychiatric/Behavioral:  Negative for hallucinations and suicidal ideas. The patient does not have insomnia.       Objective:     BP 100/72   Pulse 100   Temp 97.8 F (36.6 C) (Temporal)   Ht 5\' 1"  (1.549 m)   Wt 196 lb 3.2 oz (89 kg)   LMP 04/05/2022 (Exact Date) Comment: bcp. UPT negative  SpO2 97%   BMI 37.07 kg/m    Physical Exam Vitals and nursing note reviewed.  Constitutional:      Appearance: Normal appearance.  Cardiovascular:     Rate and Rhythm: Normal rate and regular rhythm.     Heart sounds: Normal heart sounds.  Pulmonary:     Effort: Pulmonary effort is normal.     Breath sounds: Normal breath sounds.  Neurological:     Mental Status: She is alert.      No results found for any visits on 11/01/22.    The ASCVD Risk score (Arnett DK, et al., 2019) failed to calculate for the following reasons:   The 2019 ASCVD risk score is only valid for ages 57 to  40    Assessment & Plan:   Problem List Items Addressed This Visit       Other   Attention deficit hyperactivity disorder (ADHD), predominantly hyperactive type    Currently maintained on methylphenidate 20 mg daily and 10 mg as needed.  Pending urine drug screen today.  Patient should have enough medicine to last until lab results.      Shellfish allergy    Patient needs refill on EpiPen.  EpiPen provided today      Relevant Medications   EPINEPHrine (EPIPEN 2-PAK) 0.3 mg/0.3 mL IJ SOAJ injection   Methamphetamine use (HCC) - Primary    Patient went to local emergency department for detox of methamphetamine and cocaine use.  Patient did 2 weeks of treatment in 2 weeks early.  States she doing Narcotics Anonymous will obtain urine drug screen today.  If positive she will no longer be a candidate for methylphenidate prescription from me      Relevant Orders   DRUG MONITORING, PANEL 8 WITH CONFIRMATION, URINE    Return if symptoms worsen or fail to improve, for As scheduled .    Audria Nine, NP

## 2022-11-01 NOTE — Assessment & Plan Note (Signed)
Patient needs refill on EpiPen.  EpiPen provided today

## 2022-11-01 NOTE — Patient Instructions (Signed)
Nice to see you today I will be in touch with the labs once I have them Follow up with me as scheduled, sooner if you need me

## 2022-11-01 NOTE — Assessment & Plan Note (Signed)
Patient went to local emergency department for detox of methamphetamine and cocaine use.  Patient did 2 weeks of treatment in 2 weeks early.  States she doing Narcotics Anonymous will obtain urine drug screen today.  If positive she will no longer be a candidate for methylphenidate prescription from me

## 2022-11-02 ENCOUNTER — Other Ambulatory Visit: Payer: BC Managed Care – PPO

## 2022-11-02 DIAGNOSIS — F1510 Other stimulant abuse, uncomplicated: Secondary | ICD-10-CM | POA: Diagnosis not present

## 2022-11-03 LAB — DRUG MONITORING, PANEL 8 WITH CONFIRMATION, URINE
6 Acetylmorphine: NEGATIVE ng/mL (ref <10)
Alcohol Metabolites: NEGATIVE ng/mL (ref <500)
Amphetamines: NEGATIVE ng/mL (ref <500)
Benzodiazepines: NEGATIVE ng/mL (ref <100)
Buprenorphine, Urine: NEGATIVE ng/mL (ref <5)
Cocaine Metabolite: NEGATIVE ng/mL (ref <150)
Creatinine: 75.5 mg/dL (ref >=20.0)
MDMA: NEGATIVE ng/mL (ref <500)
Marijuana Metabolite: NEGATIVE ng/mL (ref <20)
Opiates: NEGATIVE ng/mL (ref <100)
Oxidant: NEGATIVE ug/mL (ref <200)
Oxycodone: NEGATIVE ng/mL (ref <100)
pH: 7.2 (ref 4.5–9.0)

## 2022-11-03 LAB — DM TEMPLATE

## 2022-11-06 ENCOUNTER — Ambulatory Visit
Admit: 2022-11-06 | Discharge: 2022-11-07 | Payer: PRIVATE HEALTH INSURANCE | Attending: Student in an Organized Health Care Education/Training Program | Primary: Student in an Organized Health Care Education/Training Program

## 2022-11-06 DIAGNOSIS — L304 Erythema intertrigo: Principal | ICD-10-CM

## 2022-11-06 DIAGNOSIS — L732 Hidradenitis suppurativa: Principal | ICD-10-CM

## 2022-11-06 MED ORDER — DOXYCYCLINE HYCLATE 100 MG TABLET
ORAL_TABLET | Freq: Two times a day (BID) | ORAL | 0 refills | 30.00000 days | Status: CP
Start: 2022-11-06 — End: ?

## 2022-11-06 MED ORDER — NYSTATIN 100,000 UNIT/GRAM TOPICAL OINTMENT
Freq: Two times a day (BID) | TOPICAL | 5 refills | 0.00000 days | Status: CP
Start: 2022-11-06 — End: ?

## 2022-11-06 NOTE — Unmapped (Signed)
Dermatology Note     Assessment and Plan:      Hidradenitis Suppurativa Hurley Stage III, chronic, stable, on infliximab and bimekizumab  - Previously failed: Humira, Ertapenem X 12 week course completed 06/2021 (complication of PE requiring hospitalization - avoiding PICC lines in future due to this), Spironolactone (non-Derm provider discontinued due to risk of low blood pressure with other medication), augmentin (chest pain), clindamycin (C-diff), cefdinir  - Previous surgeries: unroofing left axilla 06/2021, L & R inguinal crease with Dr. Orvan Falconer 2024  - Locations: axillae (R>L), inframammary, infra-pannus, groin, inner thighs, chest  - We discussed the typical natural history, pathogenesis, treatment options, and expected course as well as the relapsing and sometimes recalcitrant nature of the disease.    - Based on discussion above and R/B/A reviewed, jointly elected to proceed with plan per below:  - Continue doxycycline 100 mg BID as needed for flared. Take with food and a full glass of water. Patient reports yeast infections with doxycycline use. Oral fluconazole contraindicated with current med patient reports nystatin cream and powder provides improvement.  - Continue bimekizumab-bkzx 160 mg/ml; inject the contents of two syringes under the skin every 8 weeks as maintenance.  - Continue infliximab 10 mg/kg injections every 4 weeks.  - Continue nystatin 100,000 unit/gram powder as needed for yeast infections.  - Continue following with [facility] for ongoing chronic pain management.  - Will plan to return to Dr. Orvan Falconer for consideration of unroofing of  right axillae, intermammary in the future     Inverse psoriasis, bilateral groin, not at treatment goal - previously diagnosed and managed by Dr. Janyth Contes  - Continue bimekizumab, infliximab as above  - Continue triamcinolone 0.1% ointment. Apply to inflamed areas of HS twice a day until improved, no longer than 2 weeks at a time. Then stop for two weeks. Restart as needed. Refilled today. Do not use until intertrigo (as below) is improved as this can worsen rash     High risk medication use - bimekizumab, infliximab  - Last Angela Burke 10/2021 negative; re-ordered as part of infusion labwork  - Monitoring labs q4 weeks w/ infusions; 10/2022 labs notable for elevated white count (16.9). Patient aware that she is on multiple immunosuppressive agents which increase her risk for infection. Should fever, generalized fatigue, night sweats, chills or other symptoms occur, patient aware to present to ED  - Infliximab level/ antibody 8.3 07/09/2022 - acceptable to continue      The patient was advised to call for an appointment should any new, changing, or symptomatic lesions develop.     RTC: Return in about 3 months (around 02/05/2023) for follow up of HS. or sooner as needed   _________________________________________________________________      Chief Complaint     Chief Complaint   Patient presents with    Hidradenitis suppurativa     Pt c/o areas of concern on chest area, states that she is doing better, has noted fewer flares.       HPI     Melissa Pearson is a 32 y.o. female who presents as a returning patient (last seen 08/02/2022) to Dermatology for follow up of HS. At last visit, patient was to start doxycycline 100 mg, infliximab 10 mg/kg, and bimekizumab 160 mg/kg for HS, continue triamcinolone 0.1% ointment for inverse psoriasis, and start nystatin 100,000 unit/gram for intertrigo.    HS  - Patient reports fewer flares since starting bimekizumab infusions and doxycycline  - States she has  received 3 doses of the bimekizumab.  - Current flare on chest  - Notes the flares are less severe and less frequent  - Estimates she has been off of doxycycline for about 2-3 weeks  - She has experienced yeast infections while taking doxycycline and bactrim (in past). Will treat with nystatin  - Patient has felt feverish recently. She had UTI about 3 weeks ago.    Inverse psoriasis  - States the rash has cleared since treating with topicals    The patient denies any other new or changing lesions or areas of concern.     Pertinent Past Medical History     No history of skin cancer    Problem List          Musculoskeletal and Integument    Hidradenitis suppurativa    Relevant Medications    doxycycline (VIBRA-TABS) 100 MG tablet     Family History:   Negative for melanoma    Past Medical History, Family History, Social History, Medication List, Allergies, and Problem List were reviewed in the rooming section of Epic.     ROS: Other than symptoms mentioned in the HPI, no fevers, chills, or other skin complaints    Physical Examination     GENERAL: Well-appearing female in no acute distress, resting comfortably.  NEURO: Alert and oriented, answers questions appropriately  PSYCH: Normal mood and affect  SKIN: Examination of the bilateral axillae, inner thighs, chest, and groin was performed  - erythematous patches on bilateral medial thighs and inguinal folds  - multiple non inflamed nodules and nondraining sinus tracts of the right axilla > left, intermammary, inguinal folds medial thighs, mons pubis    All areas not commented on are within normal limits or unremarkable    Scribe's Attestation: Gentry Fitz Robynn Pane) Irven Easterly, PA-C obtained and performed the history, physical exam and medical decision making elements that were entered into the chart. Signed by Cleda Mccreedy, Scribe, on November 06, 2022 at 1:32 PM .    ----------------------------------------------------------------------------------------------------------------------  November 06, 2022 2:22 PM. Documentation assistance provided by the Scribe. I was present during the time the encounter was recorded. The information recorded by the Scribe was done at my direction and has been reviewed and validated by me.  ---------------------------------------------------------------------------------------------------------------------- (Approved Template 11/16/2019)

## 2022-11-07 ENCOUNTER — Other Ambulatory Visit: Payer: Self-pay | Admitting: Psychiatry

## 2022-11-07 DIAGNOSIS — F3181 Bipolar II disorder: Secondary | ICD-10-CM

## 2022-11-11 ENCOUNTER — Other Ambulatory Visit: Payer: Self-pay | Admitting: Psychiatry

## 2022-11-11 DIAGNOSIS — F3181 Bipolar II disorder: Secondary | ICD-10-CM

## 2022-11-13 DIAGNOSIS — H10013 Acute follicular conjunctivitis, bilateral: Secondary | ICD-10-CM | POA: Diagnosis not present

## 2022-11-15 ENCOUNTER — Other Ambulatory Visit: Payer: Self-pay | Admitting: Psychiatry

## 2022-11-16 ENCOUNTER — Other Ambulatory Visit: Payer: Self-pay | Admitting: Nurse Practitioner

## 2022-11-16 DIAGNOSIS — F901 Attention-deficit hyperactivity disorder, predominantly hyperactive type: Secondary | ICD-10-CM

## 2022-11-16 NOTE — Telephone Encounter (Signed)
Prescription Request  11/16/2022  LOV: 11/01/2022  What is the name of the medication or equipment? methylphenidate (RITALIN) 20 MG tablet   Have you contacted your pharmacy to request a refill? No   Which pharmacy would you like this sent to?  [id number] [place], [place] - [address] [place] [address] Phone: [phone] Fax: [phone]     Patient notified that their request is being sent to the clinical staff for review and that they should receive a response within 2 business days.   Please advise at Mobile [phone] (mobile)

## 2022-11-19 DIAGNOSIS — L732 Hidradenitis suppurativa: Principal | ICD-10-CM

## 2022-11-19 DIAGNOSIS — T148XXA Other injury of unspecified body region, initial encounter: Principal | ICD-10-CM

## 2022-11-19 DIAGNOSIS — R52 Pain, unspecified: Principal | ICD-10-CM

## 2022-11-19 MED ORDER — MELOXICAM 7.5 MG TABLET
ORAL_TABLET | Freq: Two times a day (BID) | ORAL | 0 refills | 30 days | Status: CP | PRN
Start: 2022-11-19 — End: 2022-12-19

## 2022-11-19 NOTE — Unmapped (Signed)
Needs appt with Maralyn Sago for future refills. Should have returned mid August for follow up

## 2022-11-20 DIAGNOSIS — H10013 Acute follicular conjunctivitis, bilateral: Secondary | ICD-10-CM | POA: Diagnosis not present

## 2022-11-20 MED ORDER — METHYLPHENIDATE HCL 20 MG PO TABS
20.0000 mg | ORAL_TABLET | Freq: Every day | ORAL | 0 refills | Status: DC
Start: 2022-11-20 — End: 2023-01-01

## 2022-11-21 DIAGNOSIS — L732 Hidradenitis suppurativa: Principal | ICD-10-CM

## 2022-11-26 ENCOUNTER — Ambulatory Visit: Admit: 2022-11-26 | Discharge: 2022-11-27 | Payer: PRIVATE HEALTH INSURANCE

## 2022-11-26 DIAGNOSIS — L732 Hidradenitis suppurativa: Principal | ICD-10-CM

## 2022-11-26 MED ADMIN — inFLIXimab-axxq (AVSOLA) 10 mg/kg = 900 mg in sodium chloride (NS) 250 mL IVPB: 10 mg/kg | INTRAVENOUS | @ 15:00:00 | Stop: 2022-11-26

## 2022-11-26 MED ADMIN — acetaminophen (TYLENOL) tablet 650 mg: 650 mg | ORAL | @ 15:00:00 | Stop: 2022-11-26

## 2022-11-26 MED ADMIN — diphenhydrAMINE (BENADRYL) capsule/tablet 25 mg: 25 mg | ORAL | @ 15:00:00 | Stop: 2022-11-26

## 2022-11-26 MED ADMIN — cetirizine (ZYRTEC) tablet 10 mg: 10 mg | ORAL | @ 15:00:00 | Stop: 2022-11-26

## 2022-11-26 NOTE — Unmapped (Signed)
Pt is in clinic today for standard Infliximab-axxq (Avsola) 10 mg/kg dose infusion. Alert, oriented, NAD. Reports no new medical issues/allergies or S/S infection.   Pre-meds given per therapy plan orders.   #22 PIV placed to LFA, labs collected per orders, hand delivered for analysis.   @1121 : Infliximab-axxq (Avsola) 10 mg/kg dose infusion initiated at following rates:   10 ml/hr x 15 minutes  20 ml/hr x 15 minutes  40 ml/hr x 15 minutes  80 ml/hr x 15 minutes  150 ml/hr x 30 minutes  250 ml/hr x for the remainder of infusion.   @1321 : infusion completed without complications. Pt remains VSS, afebrile.   Line care provided with positive blood return; PIV flushed, discontinued.   Pt discharged from clinic in NAD, in stable condition.

## 2022-11-26 NOTE — Unmapped (Signed)
[facility] Refill Coordination Note    Specialty Medication(s) to be Shipped:   Inflammatory Disorders: Bimzelx    Other medication(s) to be shipped: No additional medications requested for fill at this time     Melissa Pearson, DOB: 1991/01/16  Phone: [phone] (home) [phone] (work)      All above HIPAA information was verified with patient's family member, Mother.     Was a Nurse, learning disability used for this call? No    Completed refill call assessment today to schedule patient's medication shipment from the [facility]  [phone]).  All relevant notes have been reviewed.     Specialty medication(s) and dose(s) confirmed: Regimen is correct and unchanged.   Changes to medications: Lauryn reports no changes at this time.  Changes to insurance: No  New side effects reported not previously addressed with a pharmacist or physician: None reported  Questions for the pharmacist: No    Confirmed patient received a Conservation officer, historic buildings and a Surveyor, mining with first shipment. The patient will receive a drug information handout for each medication shipped and additional FDA Medication Guides as required.       DISEASE/MEDICATION-SPECIFIC INFORMATION        For patients on injectable medications: Patient currently has 0 doses left.  Next injection is scheduled for 9/28.    SPECIALTY MEDICATION ADHERENCE     Medication Adherence    Patient reported X missed doses in the last month: 0  Specialty Medication: bimekizumab-bkzx (Bimzelx) 160 mg/mL AtIn  Patient is on additional specialty medications: No              Were doses missed due to medication being on hold? No    Bimzelx 160 mg/ml: 0 doses of medicine on hand        REFERRAL TO PHARMACIST     Referral to the pharmacist: Not needed      SHIPPING     Shipping address confirmed in Epic.       Delivery Scheduled: Yes, Expected medication delivery date: 11/30/22.     Medication will be delivered via UPS to the prescription address in Epic WAM.    Willette Pa   [facility]  Specialty Technician

## 2022-11-27 ENCOUNTER — Emergency Department (HOSPITAL_BASED_OUTPATIENT_CLINIC_OR_DEPARTMENT_OTHER)
Admission: EM | Admit: 2022-11-27 | Discharge: 2022-11-27 | Disposition: A | Discharge status: Home / Self Care | Payer: BC Managed Care – PPO | Attending: Emergency Medicine | Admitting: Emergency Medicine

## 2022-11-27 DIAGNOSIS — L509 Urticaria, unspecified: Secondary | ICD-10-CM | POA: Insufficient documentation

## 2022-11-27 DIAGNOSIS — R220 Localized swelling, mass and lump, head: Secondary | ICD-10-CM | POA: Diagnosis not present

## 2022-11-27 DIAGNOSIS — Z7951 Long term (current) use of inhaled steroids: Secondary | ICD-10-CM | POA: Diagnosis not present

## 2022-11-27 DIAGNOSIS — T7840XA Allergy, unspecified, initial encounter: Secondary | ICD-10-CM | POA: Diagnosis not present

## 2022-11-27 DIAGNOSIS — T782XXA Anaphylactic shock, unspecified, initial encounter: Secondary | ICD-10-CM | POA: Diagnosis not present

## 2022-11-27 MED ORDER — EPINEPHRINE 0.3 MG/0.3ML IJ SOAJ: 0.3000 mg | INTRAMUSCULAR | 0 refills | Status: DC | PRN

## 2022-11-27 MED ORDER — DIPHENHYDRAMINE HCL 50 MG/ML IJ SOLN
50.0000 mg | Freq: Once | INTRAMUSCULAR | Status: AC
  Administered 2022-11-27: 50 mg via INTRAVENOUS
  Filled 2022-11-27: qty 1

## 2022-11-27 MED ORDER — DEXAMETHASONE SODIUM PHOSPHATE 10 MG/ML IJ SOLN
10.0000 mg | Freq: Once | INTRAMUSCULAR | Status: AC
  Administered 2022-11-27: 10 mg via INTRAVENOUS
  Filled 2022-11-27: qty 1

## 2022-11-27 MED ORDER — FAMOTIDINE IN NACL 20-0.9 MG/50ML-% IV SOLN
20.0000 mg | Freq: Once | INTRAVENOUS | Status: AC
  Administered 2022-11-27: 20 mg via INTRAVENOUS
  Filled 2022-11-27: qty 50

## 2022-11-27 MED ORDER — SODIUM CHLORIDE 0.9 % IV BOLUS
1000.0000 mL | Freq: Once | INTRAVENOUS | Status: AC
  Administered 2022-11-27: 1000 mL via INTRAVENOUS

## 2022-11-27 NOTE — Discharge Instructions (Addendum)
You had anaphylaxis to shrimp.  Please avoid eating shrimp  Please carry EpiPen with you at all times and give yourself a shot if you have throat closing or trouble breathing  See your doctor for follow-up

## 2022-11-27 NOTE — ED Provider Notes (Signed)
EMERGENCY DEPARTMENT AT [facility] Provider Note   CSN: [id number] Arrival date & time: 11/27/22  1907     History  No chief complaint on file.   Courtney [name] is a 32 y.o. female history of A-fib on Eliquis, known food allergies here presenting with anaphylaxis to shrimp.  Patient states that she ate some shrimp and shortly afterwards she had hives and she states that her tongue was swollen.  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  Also took 25 mg of Benadryl prior to arrival.  She states that she is feeling better now.  Of note, patient had anaphylaxis previously.  She thought it was from the ginger sauce instead of the shrimp.  At that time she had shrimp with ginger sauce but this time she just ate shrimp.   The history is provided by the patient.       Home Medications Prior to Admission medications   Medication Sig Start Date End Date Taking? Authorizing Provider  albuterol (VENTOLIN HFA) 108 (90 Base) MCG/ACT inhaler TAKE 2 PUFFS BY MOUTH EVERY 6 HOURS AS NEEDED FOR WHEEZE OR SHORTNESS OF BREATH 07/05/22   Eden Emms, NP  apixaban (ELIQUIS) 5 MG TABS tablet Take by mouth. Patient not taking: Reported on 11/01/2022 11/10/21   [provider]  ARIPiprazole (ABILIFY) 2 MG tablet Take by mouth.    [provider]  Azelastine HCl 137 MCG/SPRAY SOLN PLACE 1 SPRAY INTO BOTH NOSTRILS 2 (TWO) TIMES DAILY. USE IN EACH NOSTRIL AS DIRECTED Patient not taking: Reported on 11/01/2022    [provider]  bifidobacterium infantis (ALIGN) capsule Take 1 capsule by mouth daily.    [provider]  BIMZELX 160 MG/ML pen Inject into the skin. 05/15/22   [provider]  busPIRone (BUSPAR) 10 MG tablet TAKE 1 TABLET BY MOUTH TWICE A DAY 02/06/22   Jomarie Longs, MD  cefdinir (OMNICEF) 300 MG capsule Take by mouth. Patient not taking: Reported on 11/01/2022    [provider]  celecoxib (CELEBREX) 200 MG  capsule Take 1 capsule (200 mg total) by mouth 2 (two) times daily. 09/08/22   Arthor Captain, PA-C  clindamycin (CLEOCIN) 300 MG capsule Take 300 mg by mouth 2 (two) times daily. 10/11/22   [provider]  diclofenac Sodium (VOLTAREN) 1 % GEL Apply 2 g topically 4 (four) times daily. 09/08/22   Arthor Captain, PA-C  diphenhydrAMINE (BENADRYL) 25 mg capsule 25 mg every 6 (six) hours as needed.    [provider]  doxycycline (VIBRA-TABS) 100 MG tablet Take by mouth. 08/21/22   [provider]  EPINEPHrine (EPIPEN 2-PAK) 0.3 mg/0.3 mL IJ SOAJ injection Inject 0.3 mg into the muscle as needed for anaphylaxis. 11/01/22   Eden Emms, NP  ertapenem [facility]) 1 g injection Inject into the vein. 04/03/21   [provider]  erythromycin ophthalmic ointment SMARTSIG:1 Sparingly In Eye(s) Every Night Patient not taking: Reported on 11/01/2022 08/14/22   [provider]  fluconazole (DIFLUCAN) 150 MG tablet Take 150 mg by mouth once. 05/15/22   [provider]  gabapentin (NEURONTIN) 600 MG tablet PLEASE SEE ATTACHED FOR DETAILED DIRECTIONS 10/16/22   [provider]  glucose blood (CONTOUR TEST) test strip And lancets #100 05/18/21   Romero Belling, MD  HYDROcodone-acetaminophen (HYCET) 7.5-325 mg/15 ml solution Take 5mL every six hours as needed for pain. 10/24/22   Mardella Layman, MD  hydrOXYzine (ATARAX) 25 MG  tablet TAKE 1 TABLET BY MOUTH EVERY 8 HOURS AS NEEDED FOR ITCHING. 01/16/22   Worthy Rancher B, FNP  inFLIXimab (REMICADE IV) Inject 1 Dose into the vein every 30 (thirty) days.    [provider]  inFLIXimab (REMICADE) 100 MG injection     [provider]  Iron-Vitamin C 100-250 MG TABS Take 1 tablet by mouth daily.    [provider]  lamoTRIgine (LAMICTAL) 100 MG tablet TAKE 1/2 TABLET TWICE A DAY BY MOUTH 10/15/22   Jomarie Longs, MD  meloxicam (MOBIC) 7.5 MG tablet Take 7.5 mg by mouth 2 (two) times daily as needed.  09/20/22   [provider]  methylphenidate (RITALIN) 20 MG tablet Take 1 tablet (20 mg total) by mouth daily before breakfast. 11/20/22   Eden Emms, NP  Microlet Lancets MISC See admin instructions. 05/18/21   [provider]  montelukast (SINGULAIR) 10 MG tablet TAKE 1 TABLET BY MOUTH EVERYDAY AT BEDTIME 05/16/22   Eden Emms, NP  norethindrone (MICRONOR) 0.35 MG tablet Take 1 tablet by mouth daily. 07/19/21   [provider]  nystatin (MYCOSTATIN/NYSTOP) powder SMARTSIG:1 Topical Daily    [provider]  nystatin ointment (MYCOSTATIN) Apply topically 2 (two) times daily. 10/11/22   [provider]  ofloxacin (FLOXIN) 0.3 % OTIC solution PLACE 10 DROPS INTO BOTH EARS DAILY.    [provider]  ondansetron (ZOFRAN) 4 MG tablet Take 1 tablet (4 mg total) by mouth every 8 (eight) hours as needed for nausea or vomiting. 11/01/22   Eden Emms, NP  oxyCODONE (ROXICODONE) 5 MG immediate release tablet Take 1 tablet (5 mg total) by mouth every 6 (six) hours as needed for severe pain. 10/25/22   White, Elita Boone, NP  predniSONE (DELTASONE) 20 MG tablet Take 2 tablets (40 mg total) by mouth daily. 10/25/22   Valinda Hoar, NP  rizatriptan (MAXALT) 10 MG tablet See Admin Instructions. PLEASE SEE ATTACHED FOR DETAILED DIRECTIONS    [provider]  Secukinumab (COSENTYX SENSOREADY PEN) 150 MG/ML SOAJ Inject the contents of 2 pens (300 mg total) once weekly at weeks 0, 1, 2, 3, and 4. As loading dose. Patient not taking: Reported on 11/01/2022 08/14/21   [provider]  SUMAtriptan (IMITREX) 100 MG tablet Take by mouth. 10/03/22   [provider]  tobramycin-dexamethasone [facility]) ophthalmic solution 1 drop 4 (four) times daily. 08/14/22   [provider]  traMADol (ULTRAM) 50 MG tablet Take by mouth. 08/08/21   [provider]  traZODone (DESYREL) 50 MG tablet TAKE 1 TABLET BY MOUTH EVERYDAY AT BEDTIME  11/10/21   Gweneth Dimitri, MD  triamcinolone ointment (KENALOG) 0.1 % PLEASE SEE ATTACHED FOR DETAILED DIRECTIONS    [provider]  venlafaxine (EFFEXOR) 75 MG tablet INCREASE EFFEXOR TO 37.5 MG IN THE MORNING AND 75 MG AT NIGHT FOR 1 WEEK, THEN INCREASE TO 75 MG TWICE DAILY FOR HEADACHES 10/29/22   [provider]      Allergies    Other, Shellfish allergy, Tapentadol, Tape, Ginger, Morphine and codeine, and Vancomycin    Review of Systems   Review of Systems  HENT:  Positive for trouble swallowing.   All other systems reviewed and are negative.   Physical Exam Updated Vital Signs There were no vitals taken for this visit. Physical Exam Vitals and nursing note reviewed.  Constitutional:      Appearance: Normal appearance.  HENT:     Head: Normocephalic.  Nose: Nose normal.     Mouth/Throat:     Mouth: Mucous membranes are moist.     Comments: Tongue is not swollen and posterior pharynx is clear Eyes:     Extraocular Movements: Extraocular movements intact.     Pupils: Pupils are equal, round, and reactive to light.  Neck:     Comments: No stridor Cardiovascular:     Rate and Rhythm: Normal rate.     Pulses: Normal pulses.     Heart sounds: Normal heart sounds.  Pulmonary:     Effort: Pulmonary effort is normal.     Breath sounds: Normal breath sounds.     Comments: No wheezing Abdominal:     General: Abdomen is flat.     Palpations: Abdomen is soft.  Musculoskeletal:        General: Normal range of motion.     Cervical back: Normal range of motion and neck supple.  Skin:    General: Skin is warm.  Neurological:     General: No focal deficit present.     Mental Status: She is alert and oriented to person, place, and time.  Psychiatric:        Mood and Affect: Mood normal.        Behavior: Behavior normal.     ED Results / Procedures / Treatments   Labs (all labs ordered are listed, but only abnormal results are displayed) Labs Reviewed -  No data to display  EKG None  Radiology No results found.  Procedures Procedures    Medications Ordered in ED Medications  diphenhydrAMINE (BENADRYL) injection 50 mg (has no administration in time range)  dexamethasone (DECADRON) injection 10 mg (has no administration in time range)  famotidine (PEPCID) IVPB 20 mg premix (has no administration in time range)  sodium chloride 0.9 % bolus 1,000 mL (has no administration in time range)    ED Course/ Medical Decision Making/ A&P                                 Medical Decision Making CANISHA CHESTER is a 33 y.o. female here presenting with possible anaphylaxis.  Patient gave herself EpiPen at 6 PM.  Patient is well-appearing.  Will try Decadron and Benadryl and Pepcid.  Will observe until around 10 PM.   10:03 PM I reassessed patient and she is feeling well.  Her tongue is not swollen.  Stable for discharge.  Will refill her EpiPen and told her to avoid eating shrimp  Risk Prescription drug management.    Final Clinical Impression(s) / ED Diagnoses Final diagnoses:  None    Rx / DC Orders ED Discharge Orders     None         Charlynne Pander, MD 11/27/22 2203

## 2022-11-27 NOTE — ED Notes (Signed)
Pt. Verbalized understanding along with patient mother at bedside. Pt. Ambulatory upon discharge with steady gait.

## 2022-11-27 NOTE — ED Triage Notes (Signed)
Pt BIB EMS after allergic reaction to shrimp. Hives and tongue noted. Epi pen and 25mg  benadryl administered by pt PTA with improvement. NAD noted at this time

## 2022-11-27 NOTE — ED Notes (Signed)
ED Provider at bedside. 

## 2022-11-27 NOTE — ED Notes (Signed)
Pt. Up to bathroom with steady gate.

## 2022-11-28 ENCOUNTER — Encounter (HOSPITAL_BASED_OUTPATIENT_CLINIC_OR_DEPARTMENT_OTHER): Payer: Self-pay | Admitting: Emergency Medicine

## 2022-11-28 ENCOUNTER — Emergency Department (HOSPITAL_BASED_OUTPATIENT_CLINIC_OR_DEPARTMENT_OTHER)
Admission: EM | Admit: 2022-11-28 | Discharge: 2022-11-28 | Disposition: A | Discharge status: Home / Self Care | Payer: BC Managed Care – PPO | Attending: Emergency Medicine | Admitting: Emergency Medicine

## 2022-11-28 DIAGNOSIS — R0602 Shortness of breath: Secondary | ICD-10-CM | POA: Diagnosis not present

## 2022-11-28 DIAGNOSIS — I4891 Unspecified atrial fibrillation: Secondary | ICD-10-CM | POA: Diagnosis not present

## 2022-11-28 DIAGNOSIS — Z7901 Long term (current) use of anticoagulants: Secondary | ICD-10-CM | POA: Diagnosis not present

## 2022-11-28 DIAGNOSIS — T7840XA Allergy, unspecified, initial encounter: Secondary | ICD-10-CM | POA: Diagnosis not present

## 2022-11-28 DIAGNOSIS — R000 Tachycardia, unspecified: Secondary | ICD-10-CM | POA: Diagnosis not present

## 2022-11-28 DIAGNOSIS — R220 Localized swelling, mass and lump, head: Secondary | ICD-10-CM | POA: Diagnosis not present

## 2022-11-28 DIAGNOSIS — T782XXA Anaphylactic shock, unspecified, initial encounter: Secondary | ICD-10-CM | POA: Diagnosis not present

## 2022-11-28 MED ORDER — METHYLPREDNISOLONE SODIUM SUCC 125 MG IJ SOLR
125.0000 mg | Freq: Once | INTRAMUSCULAR | Status: AC
  Administered 2022-11-28: 125 mg via INTRAVENOUS
  Filled 2022-11-28: qty 2

## 2022-11-28 MED ORDER — FAMOTIDINE IN NACL 20-0.9 MG/50ML-% IV SOLN
20.0000 mg | Freq: Once | INTRAVENOUS | Status: AC
  Administered 2022-11-28: 20 mg via INTRAVENOUS
  Filled 2022-11-28: qty 50

## 2022-11-28 MED ORDER — SODIUM CHLORIDE 0.9 % IV BOLUS
1000.0000 mL | Freq: Once | INTRAVENOUS | Status: AC
  Administered 2022-11-28: 1000 mL via INTRAVENOUS

## 2022-11-28 MED ORDER — ONDANSETRON HCL 4 MG/2ML IJ SOLN
4.0000 mg | Freq: Once | INTRAMUSCULAR | Status: AC
  Administered 2022-11-28: 4 mg via INTRAVENOUS
  Filled 2022-11-28: qty 2

## 2022-11-28 NOTE — ED Triage Notes (Signed)
BIB [facility]. From home. Shrimp allergy.  Ate chinese food 2 hr ago. Now complains of mouth irritation and difficulty swallowing.  EPI pen and 50 benadryl prior to EMS arrival.  4mg  Zofran. NS given.  Lung sounds clear, skin free of urticaria.

## 2022-11-28 NOTE — ED Provider Notes (Signed)
[place] EMERGENCY DEPARTMENT AT [facility] Provider Note   CSN: [id number] Arrival date & time: 11/28/22  1536     History  Chief Complaint  Patient presents with   Allergic Reaction    Courtney [name] is a 32 y.o. female history of A-fib on Eliquis presented for allergic reaction that occurred earlier today.  Patient states that 2 and half hours ago she had beef and broccoli from a [employer] and 30 minutes later began having an itchy rash on the upper portion of her body along with shortness of breath.  Patient took 50 of Benadryl right away and there maxillary used her EpiPen which resolved her symptoms.  Patient was seen yesterday for similar presentation however it was due to shrimp.  Patient is currently taking the prednisone that the previous provider gave her.  Home Medications Prior to Admission medications   Medication Sig Start Date End Date Taking? Authorizing Provider  albuterol (VENTOLIN HFA) 108 (90 Base) MCG/ACT inhaler TAKE 2 PUFFS BY MOUTH EVERY 6 HOURS AS NEEDED FOR WHEEZE OR SHORTNESS OF BREATH 07/05/22   Eden Emms, NP  apixaban (ELIQUIS) 5 MG TABS tablet Take by mouth. Patient not taking: Reported on 11/01/2022 11/10/21   [provider]  ARIPiprazole (ABILIFY) 2 MG tablet Take by mouth.    [provider]  Azelastine HCl 137 MCG/SPRAY SOLN PLACE 1 SPRAY INTO BOTH NOSTRILS 2 (TWO) TIMES DAILY. USE IN EACH NOSTRIL AS DIRECTED Patient not taking: Reported on 11/01/2022    [provider]  bifidobacterium infantis (ALIGN) capsule Take 1 capsule by mouth daily.    [provider]  BIMZELX 160 MG/ML pen Inject into the skin. 05/15/22   [provider]  busPIRone (BUSPAR) 10 MG tablet TAKE 1 TABLET BY MOUTH TWICE A DAY 02/06/22   Jomarie Longs, MD  cefdinir (OMNICEF) 300 MG capsule Take by mouth. Patient not taking: Reported on 11/01/2022    [provider]  celecoxib (CELEBREX) 200 MG capsule Take 1  capsule (200 mg total) by mouth 2 (two) times daily. 09/08/22   Arthor Captain, PA-C  clindamycin (CLEOCIN) 300 MG capsule Take 300 mg by mouth 2 (two) times daily. 10/11/22   [provider]  diclofenac Sodium (VOLTAREN) 1 % GEL Apply 2 g topically 4 (four) times daily. 09/08/22   Arthor Captain, PA-C  diphenhydrAMINE (BENADRYL) 25 mg capsule 25 mg every 6 (six) hours as needed.    [provider]  doxycycline (VIBRA-TABS) 100 MG tablet Take by mouth. 08/21/22   [provider]  EPINEPHrine (EPIPEN 2-PAK) 0.3 mg/0.3 mL IJ SOAJ injection Inject 0.3 mg into the muscle as needed for anaphylaxis. 11/01/22   Eden Emms, NP  EPINEPHrine 0.3 mg/0.3 mL IJ SOAJ injection Inject 0.3 mg into the muscle as needed for anaphylaxis. 11/27/22   Charlynne Pander, MD  ertapenem [facility]) 1 g injection Inject into the vein. 04/03/21   [provider]  erythromycin ophthalmic ointment SMARTSIG:1 Sparingly In Eye(s) Every Night Patient not taking: Reported on 11/01/2022 08/14/22   [provider]  fluconazole (DIFLUCAN) 150 MG tablet Take 150 mg by mouth once. 05/15/22   [provider]  gabapentin (NEURONTIN) 600 MG tablet PLEASE SEE ATTACHED FOR DETAILED DIRECTIONS 10/16/22   [provider]  glucose blood (CONTOUR TEST) test strip And lancets #100 05/18/21   Romero Belling, MD  HYDROcodone-acetaminophen (HYCET) 7.5-325 mg/15 ml solution Take 5mL every six hours as needed for pain. 10/24/22  Mardella Layman, MD  hydrOXYzine (ATARAX) 25 MG tablet TAKE 1 TABLET BY MOUTH EVERY 8 HOURS AS NEEDED FOR ITCHING. 01/16/22   Worthy Rancher B, FNP  inFLIXimab (REMICADE IV) Inject 1 Dose into the vein every 30 (thirty) days.    [provider]  inFLIXimab (REMICADE) 100 MG injection     [provider]  Iron-Vitamin C 100-250 MG TABS Take 1 tablet by mouth daily.    [provider]  lamoTRIgine (LAMICTAL) 100 MG tablet TAKE 1/2 TABLET TWICE A DAY BY  MOUTH 10/15/22   Jomarie Longs, MD  meloxicam (MOBIC) 7.5 MG tablet Take 7.5 mg by mouth 2 (two) times daily as needed. 09/20/22   [provider]  methylphenidate (RITALIN) 20 MG tablet Take 1 tablet (20 mg total) by mouth daily before breakfast. 11/20/22   Eden Emms, NP  Microlet Lancets MISC See admin instructions. 05/18/21   [provider]  montelukast (SINGULAIR) 10 MG tablet TAKE 1 TABLET BY MOUTH EVERYDAY AT BEDTIME 05/16/22   Eden Emms, NP  norethindrone (MICRONOR) 0.35 MG tablet Take 1 tablet by mouth daily. 07/19/21   [provider]  nystatin (MYCOSTATIN/NYSTOP) powder SMARTSIG:1 Topical Daily    [provider]  nystatin ointment (MYCOSTATIN) Apply topically 2 (two) times daily. 10/11/22   [provider]  ofloxacin (FLOXIN) 0.3 % OTIC solution PLACE 10 DROPS INTO BOTH EARS DAILY.    [provider]  ondansetron (ZOFRAN) 4 MG tablet Take 1 tablet (4 mg total) by mouth every 8 (eight) hours as needed for nausea or vomiting. 11/01/22   Eden Emms, NP  oxyCODONE (ROXICODONE) 5 MG immediate release tablet Take 1 tablet (5 mg total) by mouth every 6 (six) hours as needed for severe pain. 10/25/22   White, Elita Boone, NP  predniSONE (DELTASONE) 20 MG tablet Take 2 tablets (40 mg total) by mouth daily. 10/25/22   Valinda Hoar, NP  rizatriptan (MAXALT) 10 MG tablet See Admin Instructions. PLEASE SEE ATTACHED FOR DETAILED DIRECTIONS    [provider]  Secukinumab (COSENTYX SENSOREADY PEN) 150 MG/ML SOAJ Inject the contents of 2 pens (300 mg total) once weekly at weeks 0, 1, 2, 3, and 4. As loading dose. Patient not taking: Reported on 11/01/2022 08/14/21   [provider]  SUMAtriptan (IMITREX) 100 MG tablet Take by mouth. 10/03/22   [provider]  tobramycin-dexamethasone [facility]) ophthalmic solution 1 drop 4 (four) times daily. 08/14/22   [provider]  traMADol (ULTRAM) 50 MG tablet Take by  mouth. 08/08/21   [provider]  traZODone (DESYREL) 50 MG tablet TAKE 1 TABLET BY MOUTH EVERYDAY AT BEDTIME 11/10/21   Gweneth Dimitri, MD  triamcinolone ointment (KENALOG) 0.1 % PLEASE SEE ATTACHED FOR DETAILED DIRECTIONS    [provider]  venlafaxine (EFFEXOR) 75 MG tablet INCREASE EFFEXOR TO 37.5 MG IN THE MORNING AND 75 MG AT NIGHT FOR 1 WEEK, THEN INCREASE TO 75 MG TWICE DAILY FOR HEADACHES 10/29/22   [provider]      Allergies    Other, Shellfish allergy, Tapentadol, Tape, Ginger, Morphine and codeine, and Vancomycin    Review of Systems   Review of Systems  Physical Exam Updated Vital Signs BP 103/66   Pulse 87   Resp (!) 29   Ht 5\' 1"  (1.549 m)   Wt 89.4 kg   SpO2 98%   BMI 37.22 kg/m  Physical Exam Vitals reviewed.  Constitutional:  General: She is not in acute distress.    Comments: Resting comfortably on the bed  HENT:     Head: Normocephalic and atraumatic.     Comments: No facial swelling    Mouth/Throat:     Mouth: Mucous membranes are moist.     Pharynx: No posterior oropharyngeal erythema.     Comments: Tolerate secretions No muffled voice Eyes:     Extraocular Movements: Extraocular movements intact.     Conjunctiva/sclera: Conjunctivae normal.     Pupils: Pupils are equal, round, and reactive to light.  Neck:     Comments: No neck swelling Cardiovascular:     Rate and Rhythm: Normal rate and regular rhythm.     Pulses: Normal pulses.     Heart sounds: Normal heart sounds.     Comments: 2+ bilateral radial/dorsalis pedis pulses with regular rate Pulmonary:     Effort: Pulmonary effort is normal. No respiratory distress.     Breath sounds: Normal breath sounds.  Abdominal:     Palpations: Abdomen is soft.     Tenderness: There is no abdominal tenderness. There is no guarding or rebound.  Musculoskeletal:        General: Normal range of motion.     Cervical back: Normal range of motion and neck supple.      Comments: 5 out of 5 bilateral grip/leg extension strength  Skin:    General: Skin is warm and dry.     Capillary Refill: Capillary refill takes less than 2 seconds.     Comments: Urticaria noted in the upper torso and neck area  Neurological:     General: No focal deficit present.     Mental Status: She is alert and oriented to person, place, and time.     Comments: Sensation intact in all 4 limbs  Psychiatric:        Mood and Affect: Mood normal.     ED Results / Procedures / Treatments   Labs (all labs ordered are listed, but only abnormal results are displayed) Labs Reviewed - No data to display  EKG None  Radiology No results found.  Procedures Procedures    Medications Ordered in ED Medications  sodium chloride 0.9 % bolus 1,000 mL (0 mLs Intravenous Stopped 11/28/22 1656)  methylPREDNISolone sodium succinate (SOLU-MEDROL) 125 mg/2 mL injection 125 mg (125 mg Intravenous Given 11/28/22 1605)  famotidine (PEPCID) IVPB 20 mg premix (0 mg Intravenous Stopped 11/28/22 1640)  ondansetron (ZOFRAN) injection 4 mg (4 mg Intravenous Given 11/28/22 1718)    ED Course/ Medical Decision Making/ A&P                                 Medical Decision Making Risk Prescription drug management.   [name] 32 y.o. presented today for allergic reaction.  Working DDx that I considered at this time includes, but not limited to, simple allergic reaction, anaphylaxis, anaphylactic shock, airway compromise, SJS/TEN, medication induced.  R/o DDx: anaphylaxis, anaphylactic shock, airway compromise, SJS/TEN, medication induced: These are considered less likely due to history of present illness, physical exam, labs/imaging findings  Review of prior external notes: 11/28/2022 ED  Unique Tests and My Interpretation: None  Discussion with Independent Historian: None  Discussion of Management of Tests: None  Risk: Medium: prescription drug management  Risk Stratification Score:  None  Plan: On exam patient was in no acute distress with stable vitals comfortably resting on  the bed.  Patient did have urticaria noted in the torso and neck area however her lungs are clear to auscultation bilaterally and patient did not have muffled voice nor endorsing shortness of breath or chest pain.  Patient states that she used her EpiPen 1/2 hours prior to arrival and did use the 50 mg of Benadryl as well.  Patient is unsure of what caused this reaction as she did not have any shrimp.  Will give fluids, sign Medrol, Pepcid and monitor until 6:30 PM as this would have been 4 hours after EpiPen use.  At 6:30 PM patient was evaluated and she stated that she felt much better and wanted be discharged.  Patient will be discharged and encouraged to pick up the EpiPen's that the previous provider prescribed.  Patient did get Decadron yesterday and so with getting slight Medrol today will not prescribe prednisone however encouraged patient to use Claritin or Zyrtec in the morning for antihistamines and Benadryl at night.  Encourage patient to follow-up with her primary care provider to have an allergist referral as patient said 2 episodes of allergic reactions in 2 days from different substances.  Patient was given return precautions. Patient stable for discharge at this time.  Patient verbalized understanding of plan.         Final Clinical Impression(s) / ED Diagnoses Final diagnoses:  Allergic reaction, initial encounter    Rx / DC Orders ED Discharge Orders     None         Remi Deter 11/28/22 2004    Terrilee Files, MD 11/29/22 1032

## 2022-11-28 NOTE — Discharge Instructions (Signed)
Please follow-up with your primary care provider in regards to her symptoms and ER visit.  Today you improved after receiving medication and you are stable to be discharged.  Please speak with your primary care provider about being referred to an allergist as you have had 2 allergic reactions in the past 2 days.  You may use antihistamines in the morning such as Claritin or Zyrtec and Benadryl at night.  Please use the EpiPen's that Dr. Silverio Lay prescribed last night if you begin to have wheezing or shortness of breath or chest or neck tightness.  If symptoms change or worsen please return to ER.

## 2022-11-29 LAB — TB AG1: TB AG1 VALUE: 0.11

## 2022-11-29 LAB — TB AG2: TB AG2 VALUE: 0.11

## 2022-11-29 LAB — QUANTIFERON TB GOLD PLUS
QUANTIFERON ANTIGEN 1 MINUS NIL: 0.01 [IU]/mL
QUANTIFERON ANTIGEN 2 MINUS NIL: 0.01 [IU]/mL
QUANTIFERON MITOGEN: 9.9 [IU]/mL
QUANTIFERON TB GOLD PLUS: NEGATIVE
QUANTIFERON TB NIL VALUE: 0.1 [IU]/mL

## 2022-11-29 LAB — TB MITOGEN: TB MITOGEN VALUE: 10

## 2022-11-29 LAB — TB NIL: TB NIL VALUE: 0.1

## 2022-11-29 MED FILL — BIMZELX AUTOINJECTOR 160 MG/ML SUBCUTANEOUS AUTO-INJECTOR: SUBCUTANEOUS | 28 days supply | Qty: 2 | Fill #3

## 2022-12-02 IMAGING — US US EXTREM LOW VENOUS
1 series · 14 of 24 positions shown · non-contrast
Comparison: None.

CLINICAL DATA: Patient with positive pulmonary embolus.

EXAM:
BILATERAL LOWER EXTREMITY VENOUS DOPPLER ULTRASOUND
TECHNIQUE: Gray-scale sonography with compression, as well as color and duplex
ultrasound, were performed to evaluate the deep venous system(s)
from the level of the common femoral vein through the popliteal and
proximal calf veins.

[Series 1: us venous img lower bilat (dvt) · portal-venous · 14 of 57 slices shown]
[im 1/57]
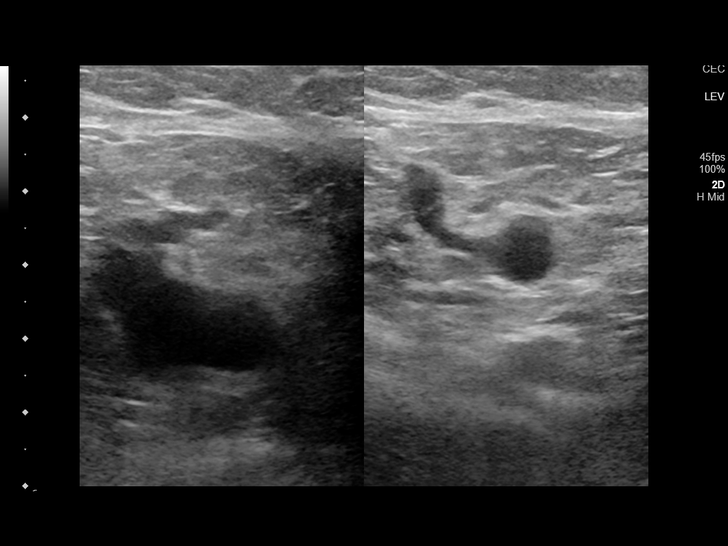
[im 5/57]
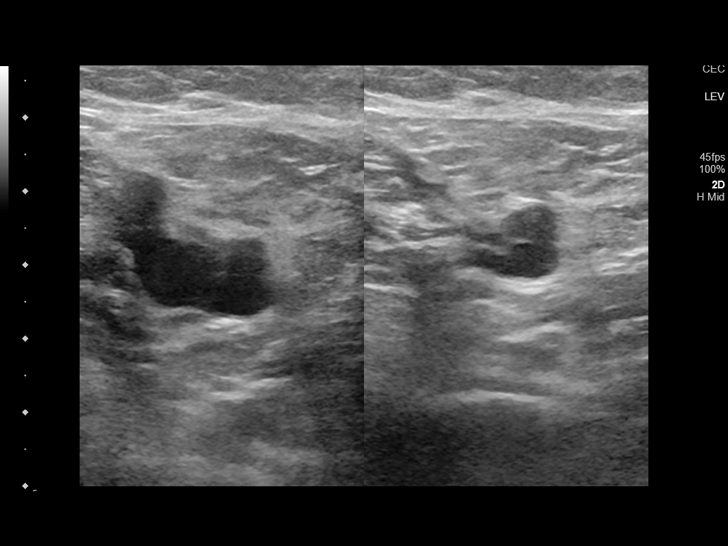
[im 10/57]
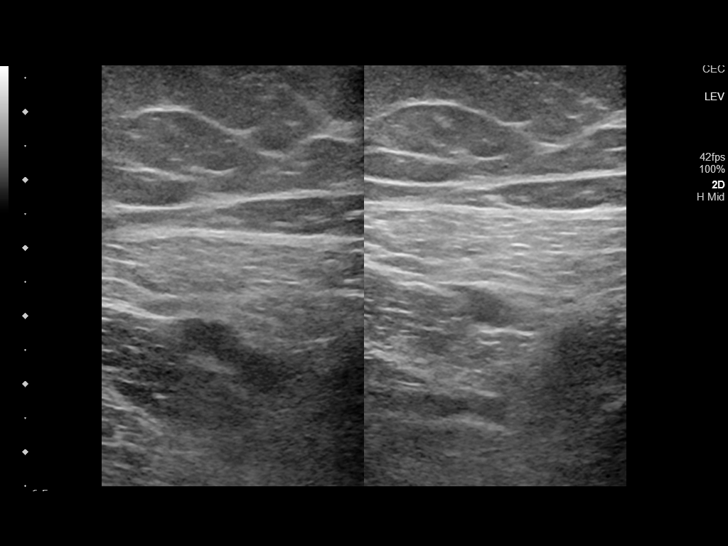
[im 15/57]
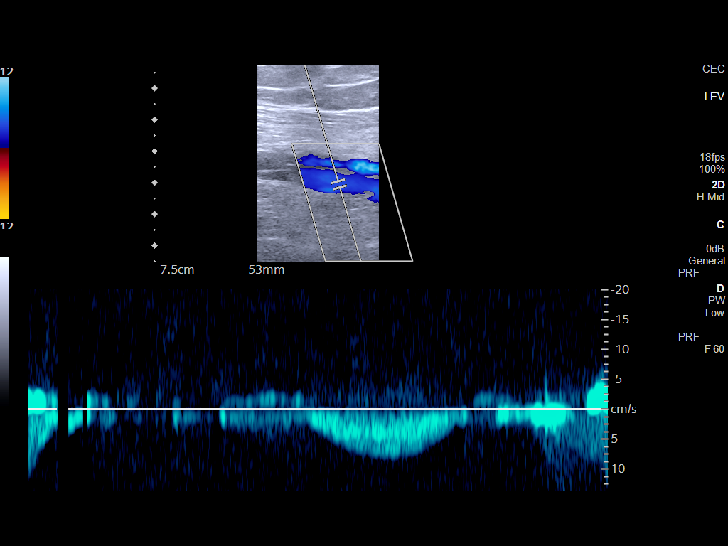
[im 18/57]
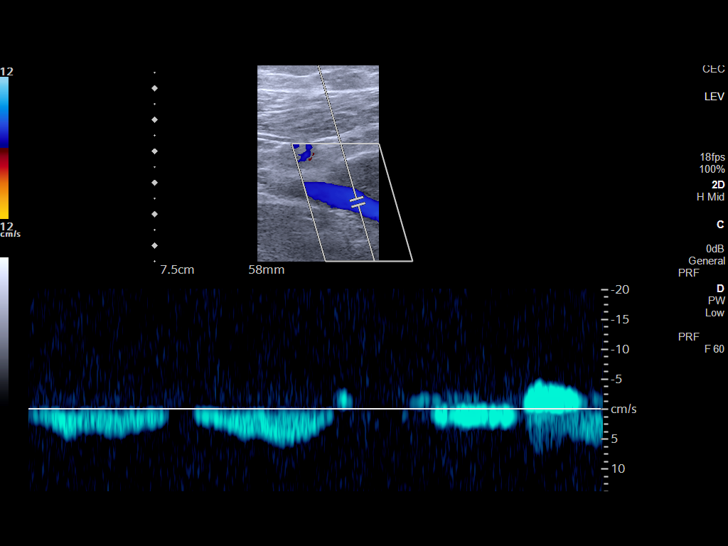
[im 22/57]
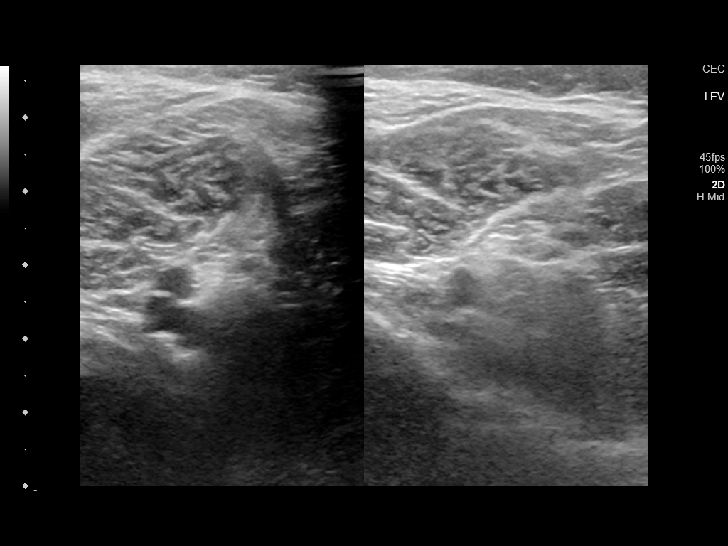
[im 27/57]
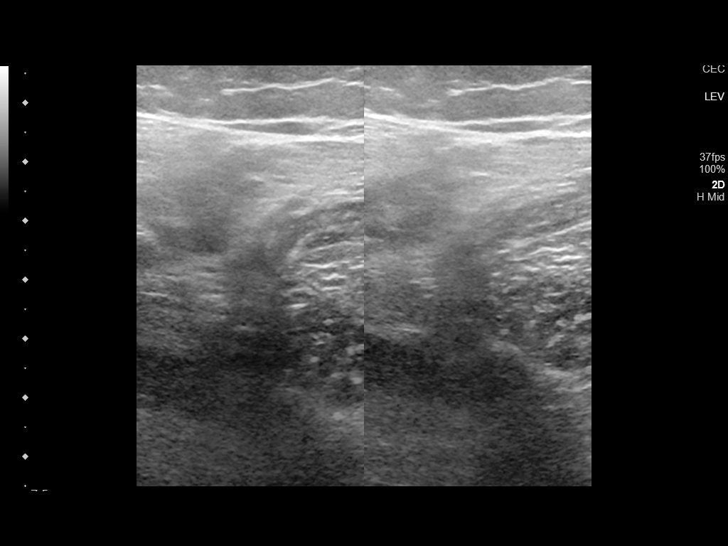
[im 30/57]
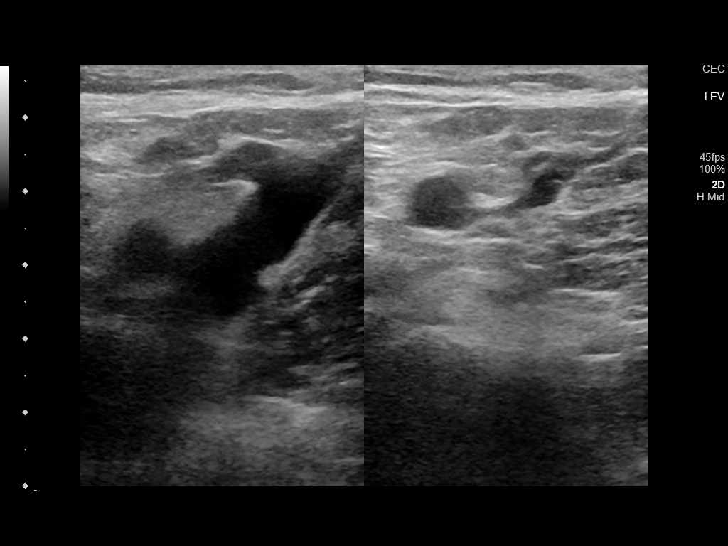
[im 35/57]
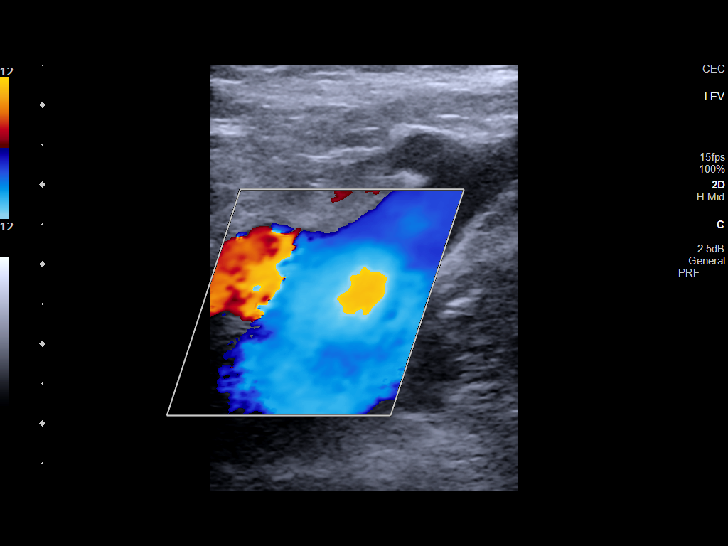
[im 39/57]
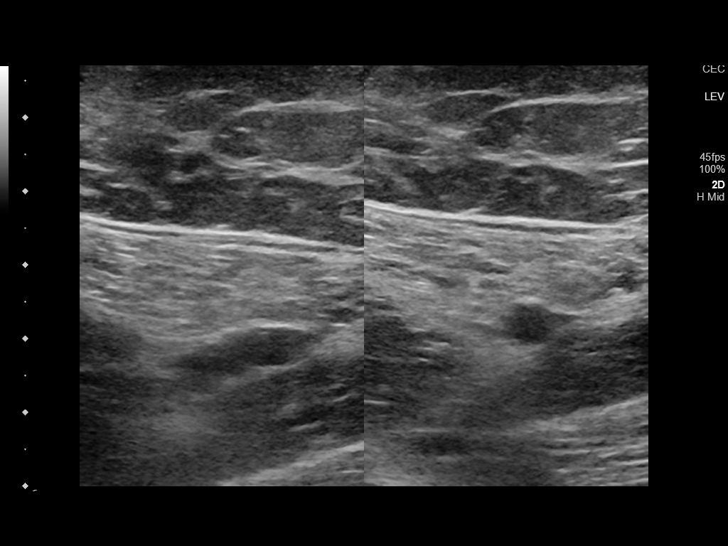
[im 44/57]
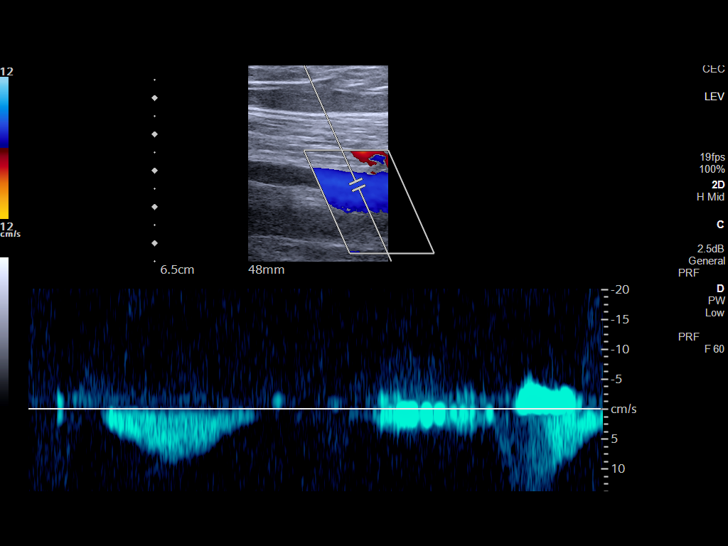
[im 47/57]
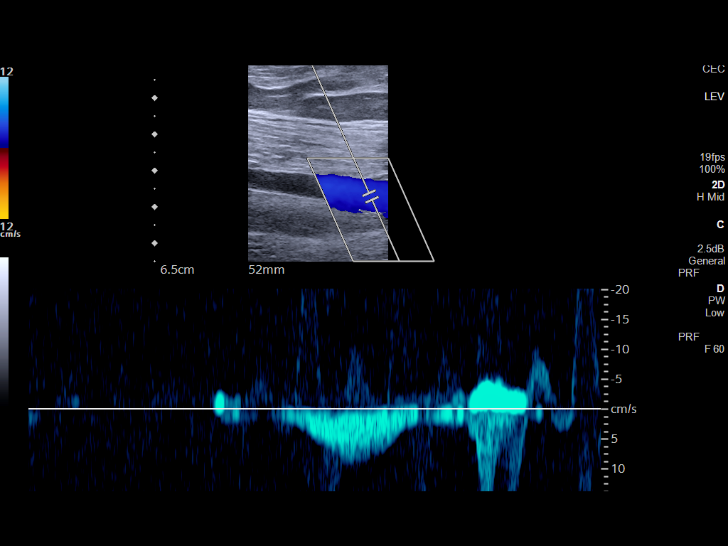
[im 52/57]
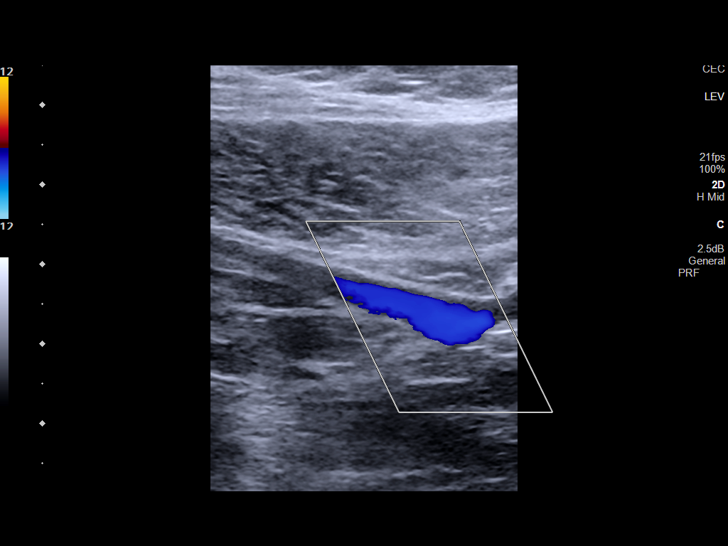
[im 57/57]
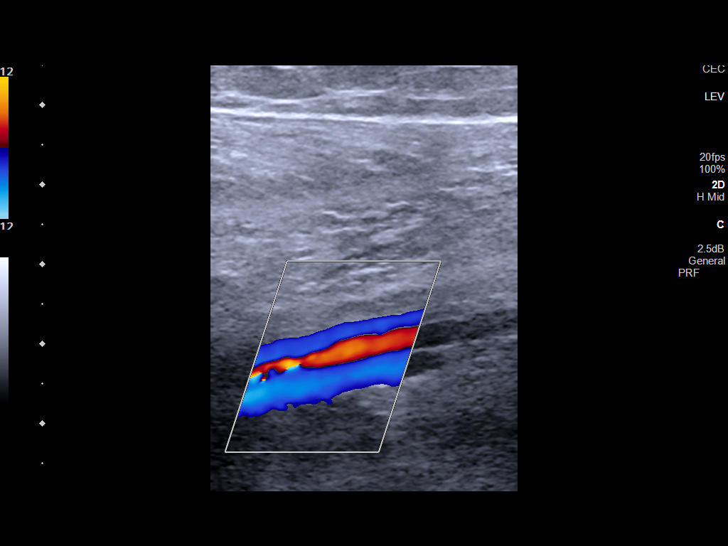

[14 of 24 positions shown; findings below may reference images not displayed]

FINDINGS: VENOUS

Normal compressibility of the common femoral, superficial femoral,
and popliteal veins, as well as the visualized calf veins.
Visualized portions of profunda femoral vein and great saphenous
vein unremarkable. No filling defects to suggest DVT on grayscale or
color Doppler imaging. Doppler waveforms show normal direction of
venous flow, normal respiratory plasticity and response to
augmentation.

Limited views of the contralateral common femoral vein are
unremarkable.

OTHER

None.

Limitations: none
IMPRESSION: Negative.

## 2022-12-02 IMAGING — CT CT ANGIO CHEST
2 of 7 series · 17 of 46 positions shown · IV contrast (APPLIED)
Comparison: CTA chest dated 08/28/2020. CT abdomen/pelvis dated
09/25/2017.

CLINICAL DATA: Altered mental status, lethargy, drowsiness,
possible syncope. Evaluate for PE.



[Series 5: thins · axial · 0.76mm/px · z∈[-869,-633]mm · 14 of 266 slices shown]
[im 15/266  lung]
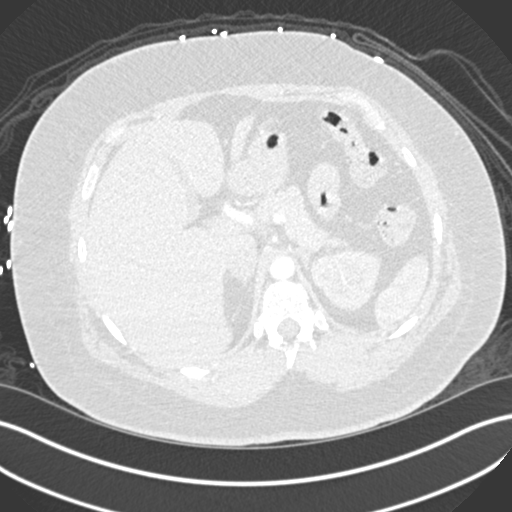
[im 30/266  soft-tissue]
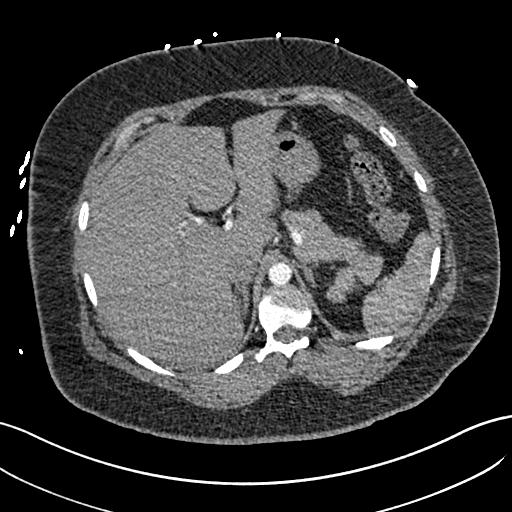
[im 59/266  lung]
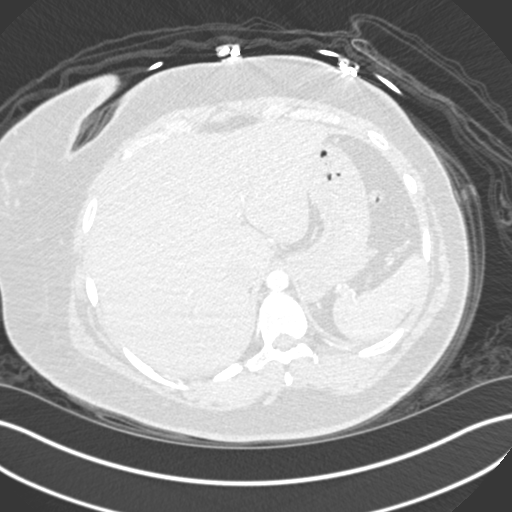
[im 74/266  soft-tissue]
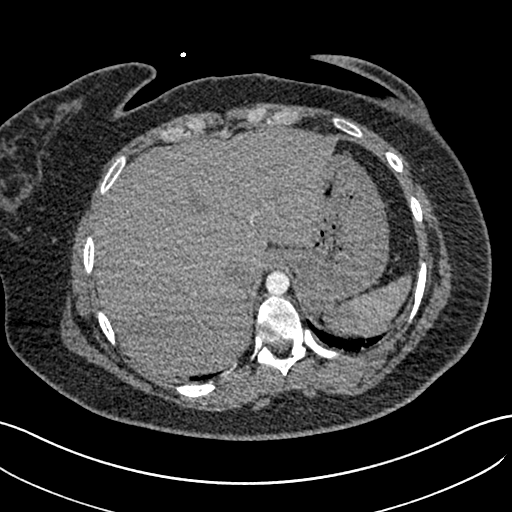
[im 89/266  lung]
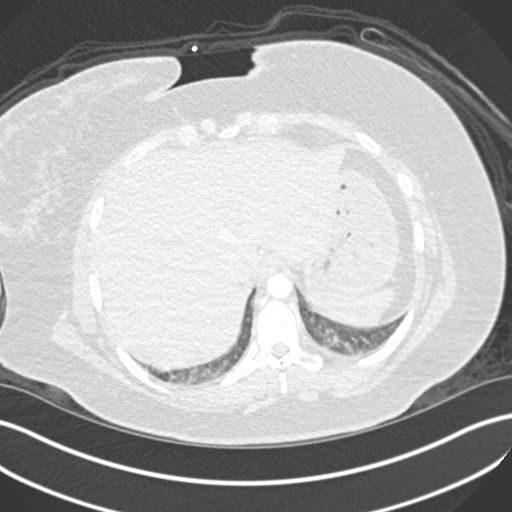
[im 104/266  soft-tissue]
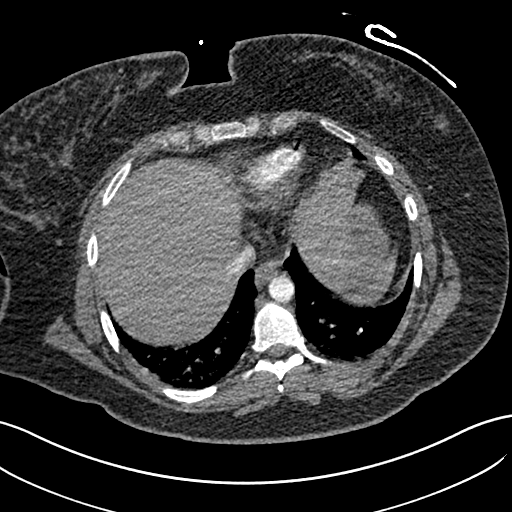
[im 118/266  lung]
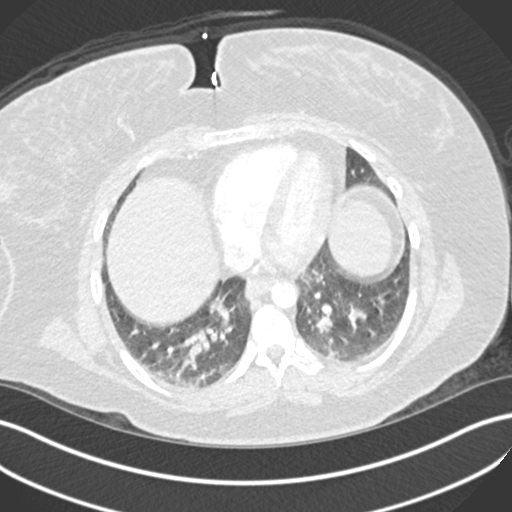
[im 148/266  soft-tissue]
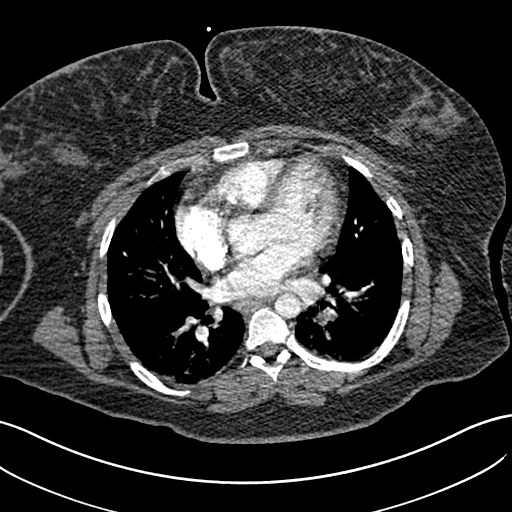
[im 162/266  lung]
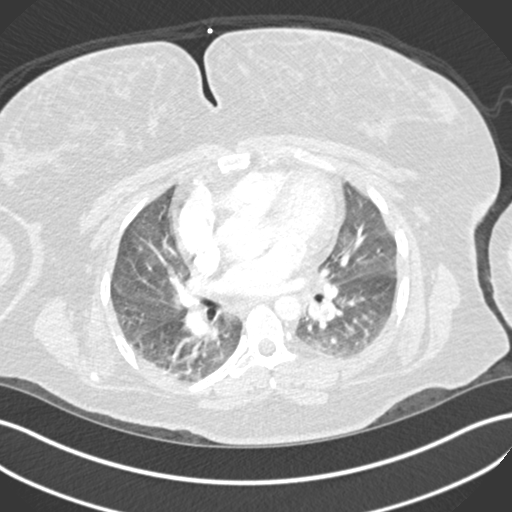
[im 177/266  soft-tissue]
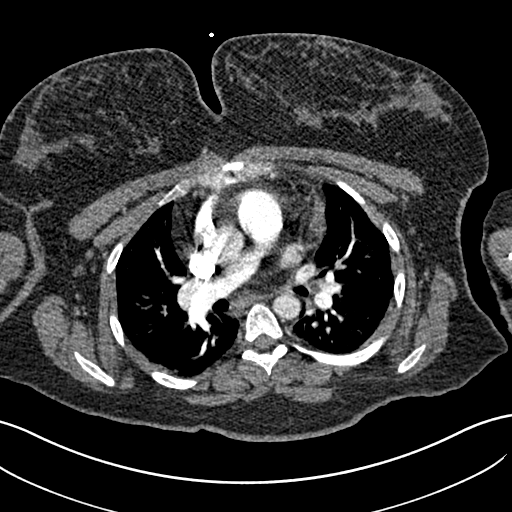
[im 192/266  lung]
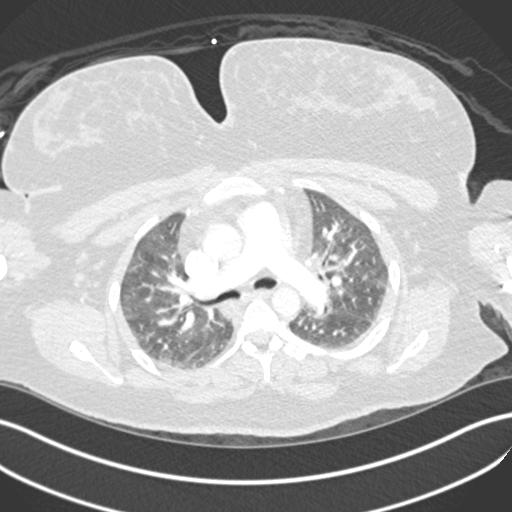
[im 207/266  soft-tissue]
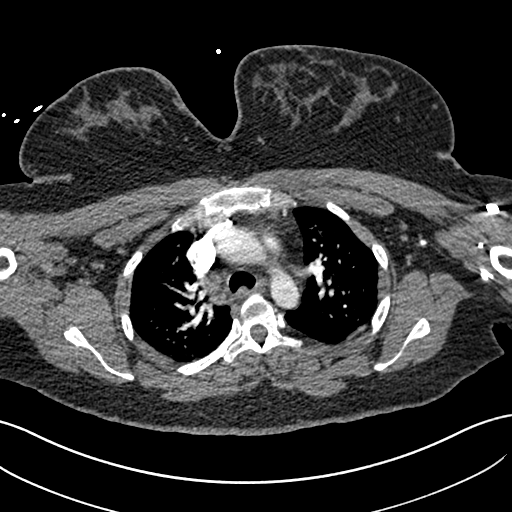
[im 236/266  lung]
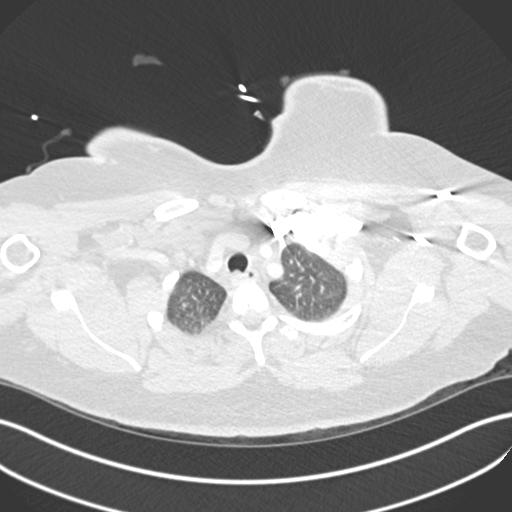
[im 251/266  soft-tissue]
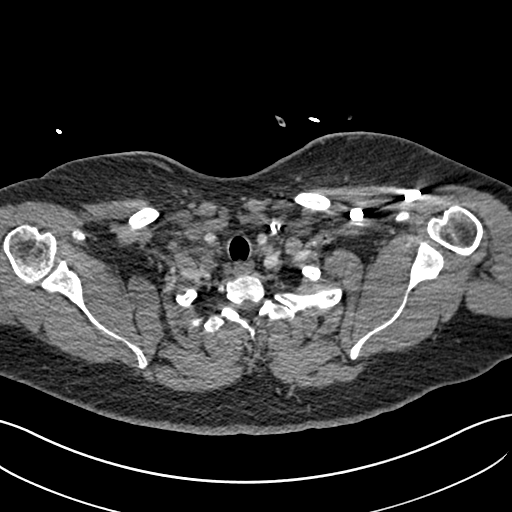

[Series 7: coronal mpr · coronal · 0.53mm/px · 3 of 161 slices shown]
[im 41/161  soft-tissue]
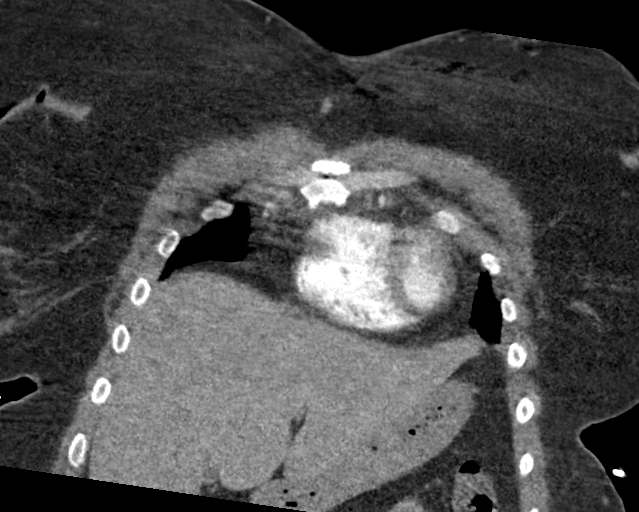
[im 81/161  soft-tissue]
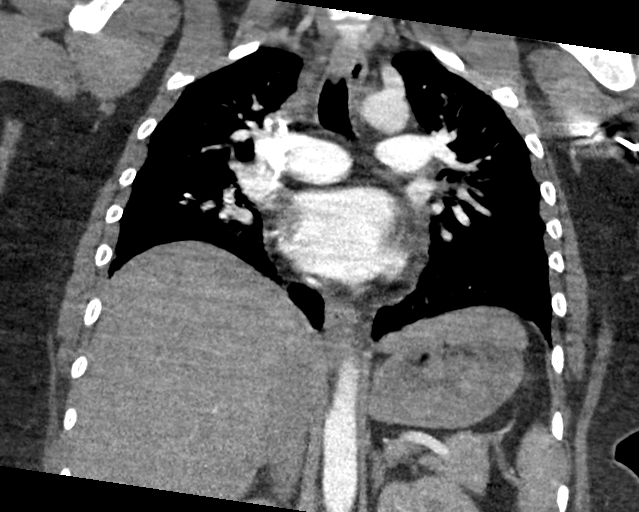
[im 121/161  soft-tissue]
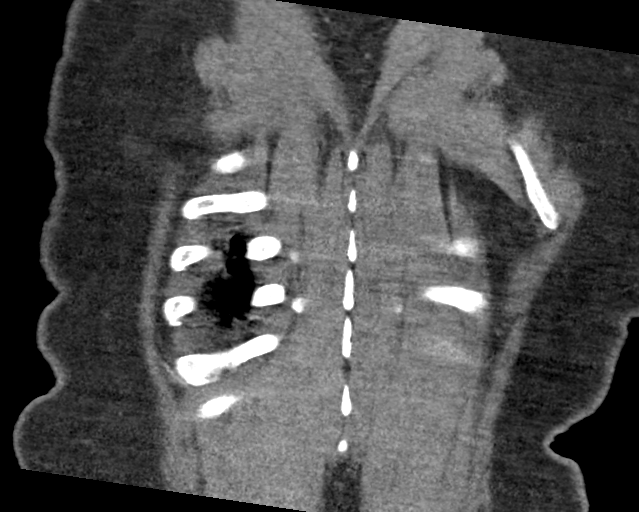

[17 of 46 positions shown; findings below may reference images not displayed]

RADIATION DOSE REDUCTION: This exam was performed according to the
departmental dose-optimization program which includes automated
exposure control, adjustment of the mA and/or kV according to
patient size and/or use of iterative reconstruction technique.

CONTRAST:  100mL OMNIPAQUE IOHEXOL 350 MG/ML SOLN
FINDINGS: CTA CHEST FINDINGS

Cardiovascular: Satisfactory opacification the bilateral pulmonary
arteries to the segmental level. Lobar/segmental pulmonary emboli in
the bilateral lower lobes (series 5/images 98, 111, and 136).
Overall clot burden is small to moderate. No evidence of right heart
strain.

No evidence of thoracic aortic aneurysm.

The heart is normal in size.  No pericardial effusion.

Mediastinum/Nodes: No suspicious mediastinal lymphadenopathy.

Visualized thyroid is unremarkable.

Lungs/Pleura: Scattered ground-glass opacity/mosaic attenuation in
the lungs bilaterally.

Mild bibasilar atelectasis.

No focal consolidation.

No suspicious pulmonary nodules, noting motion degradation.

No pleural effusion or pneumothorax.

Musculoskeletal: Visualized osseous structures are within normal
limits.

Review of the MIP images confirms the above findings.

CT ABDOMEN and PELVIS FINDINGS

Hepatobiliary: Liver is within normal limits.

Gallbladder is unremarkable. No intrahepatic or extrahepatic ductal
dilatation.

Pancreas: Within normal limits.

Spleen: Within normal limits.

Adrenals/Urinary Tract: Adrenal glands are within normal limits.

Kidneys are within normal limits.  No hydronephrosis.

Bladder is within normal limits.

Stomach/Bowel: Stomach is within limits.

No evidence of bowel obstruction.

Normal appendix (series 2/image 31).

No colonic wall thickening or inflammatory changes.

Vascular/Lymphatic: No evidence of abdominal aortic aneurysm.

No suspicious abdominopelvic lymphadenopathy.

Reproductive: Uterus is within normal limits.

Bilateral ovaries are within normal limits.

Other: No abdominopelvic ascites.

Musculoskeletal: Visualized osseous structures are within normal
limits.

Review of the MIP images confirms the above findings.
IMPRESSION: Lobar/segmental bilateral lower lobe pulmonary emboli. Overall clot
burden is small to moderate. No evidence of right heart strain.

Negative CT abdomen/pelvis.

Critical Value/emergent results were called by telephone at the time
of interpretation on 07/02/2021 at [DATE] to provider Dr Amppu,
who verbally acknowledged these results.

## 2022-12-02 IMAGING — CT CT ABD-PELV W/ CM
2 of 4 series · 15 of 46 positions shown, 17 images · IV contrast (agent unspecified)
Comparison: CTA chest dated 08/28/2020. CT abdomen/pelvis dated
09/25/2017.

CLINICAL DATA: Altered mental status, lethargy, drowsiness,
possible syncope. Evaluate for PE.



[Series 2: axial st · axial · 0.88mm/px · z∈[-1222,-742]mm · 12 of 106 slices shown, 14 images]
[im 5/106  soft-tissue]
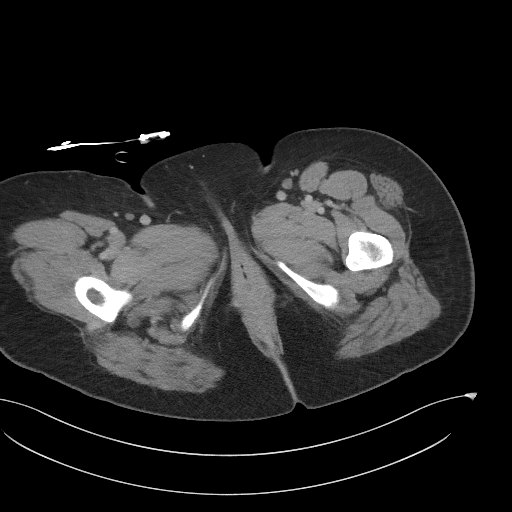
[im 5/106  bone]
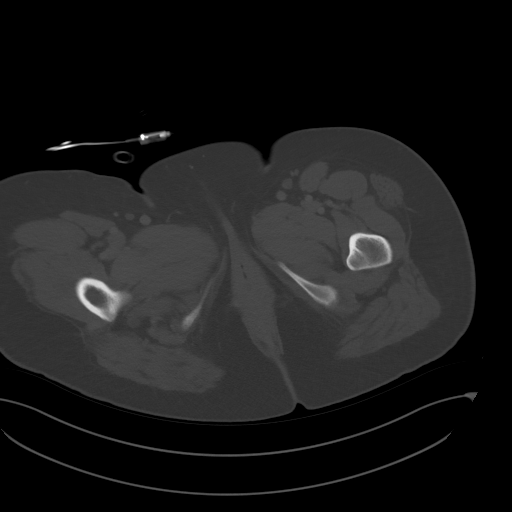
[im 14/106  soft-tissue]
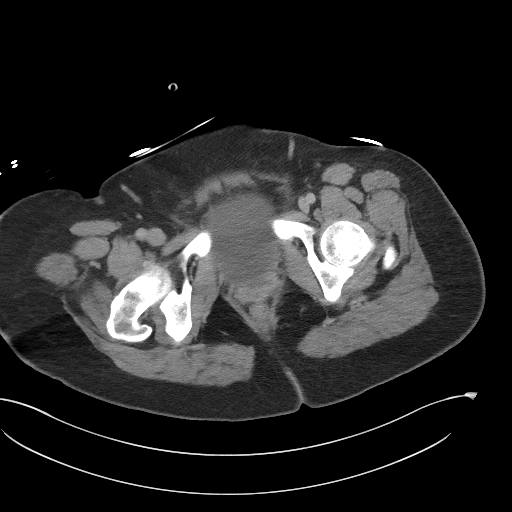
[im 22/106  soft-tissue]
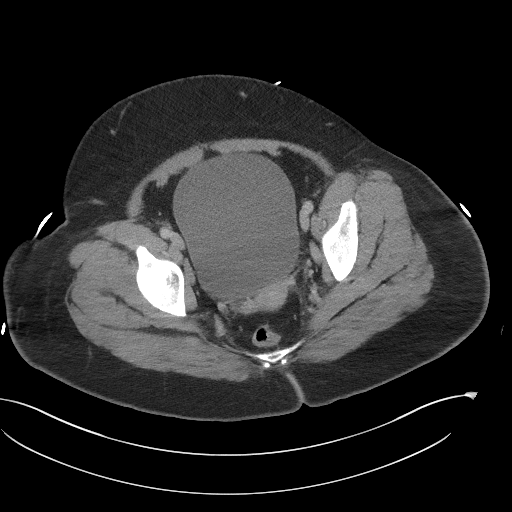
[im 31/106  soft-tissue]
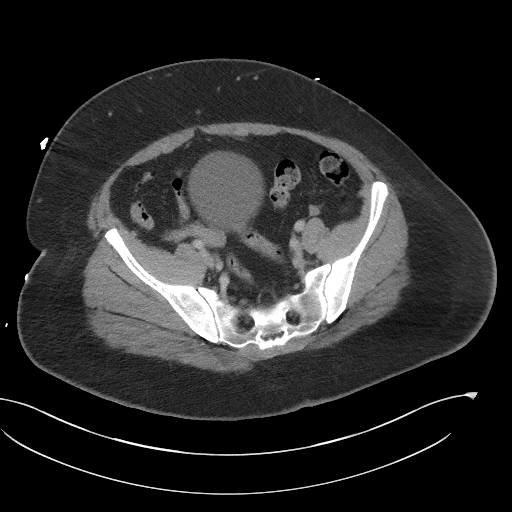
[im 40/106  soft-tissue]
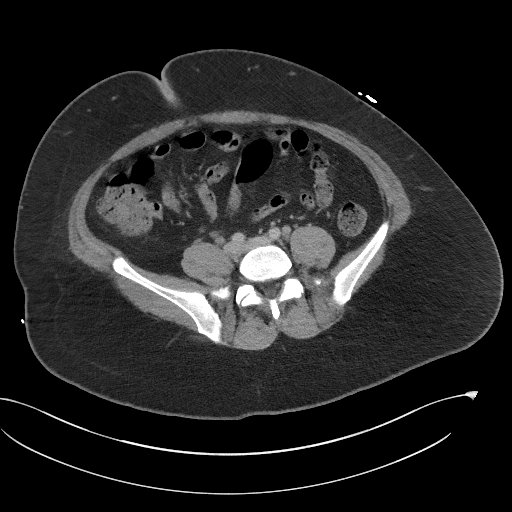
[im 49/106  soft-tissue]
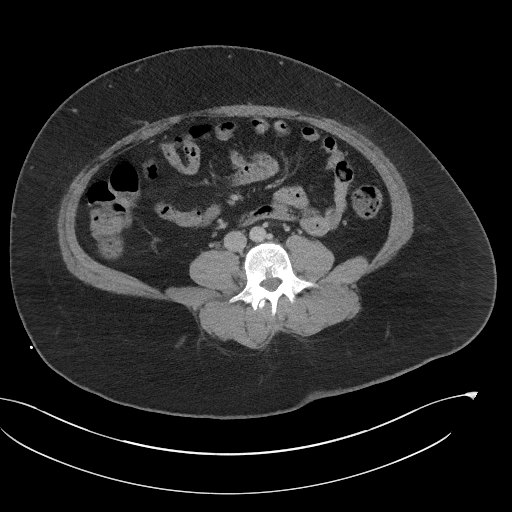
[im 57/106  soft-tissue]
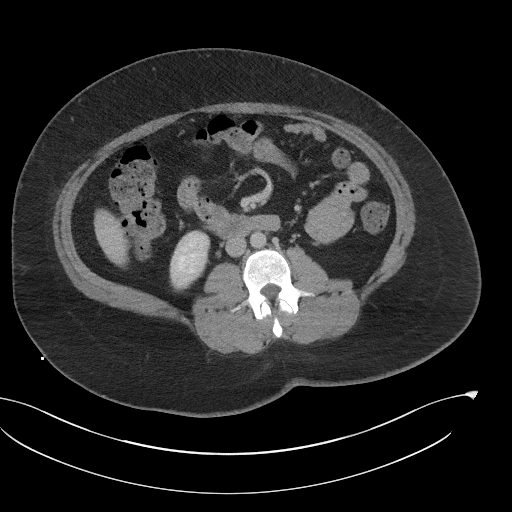
[im 66/106  soft-tissue]
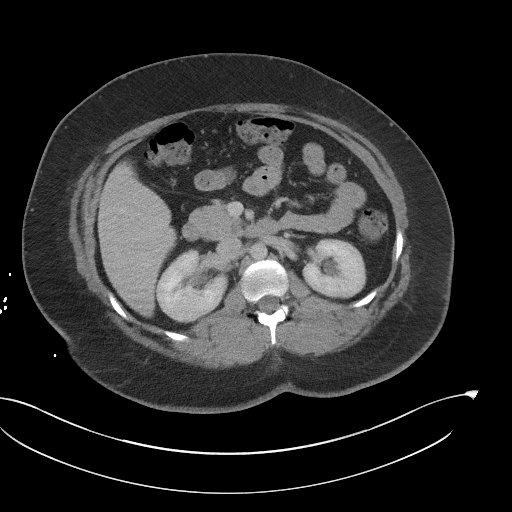
[im 75/106  soft-tissue]
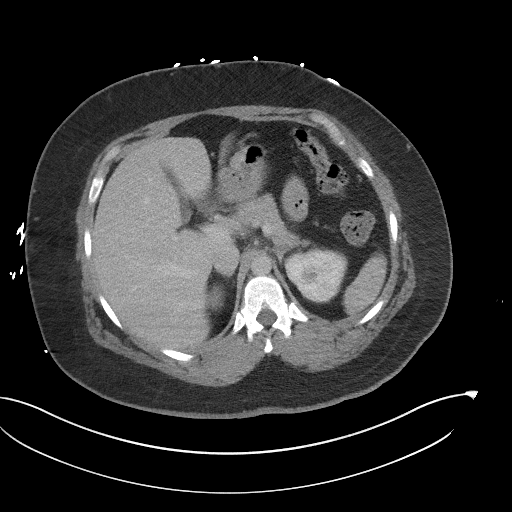
[im 75/106  bone]
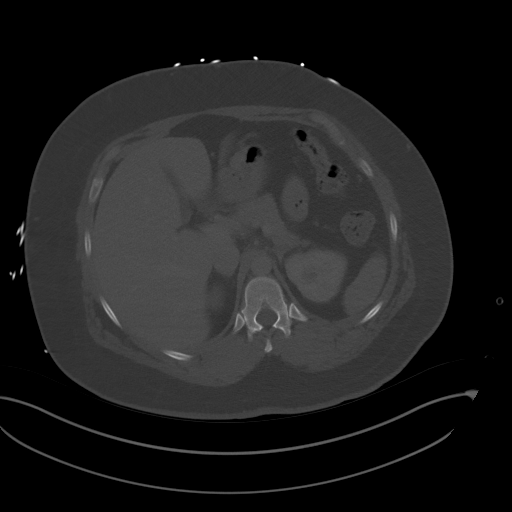
[im 84/106  soft-tissue]
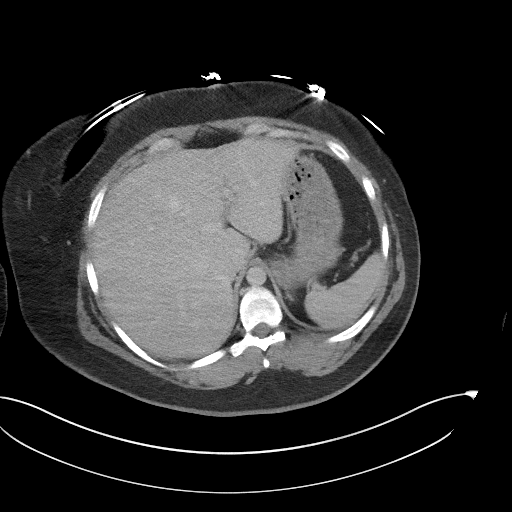
[im 92/106  soft-tissue]
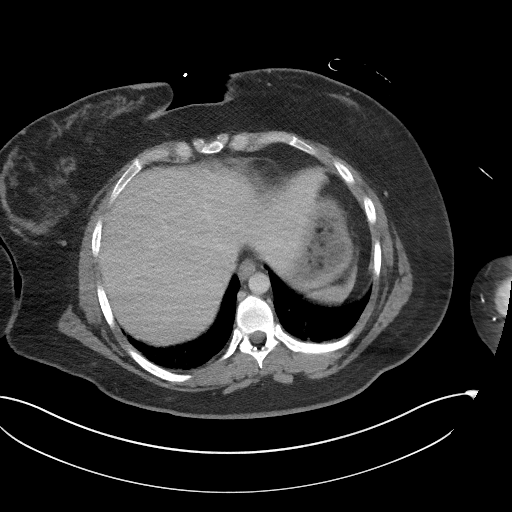
[im 101/106  soft-tissue]
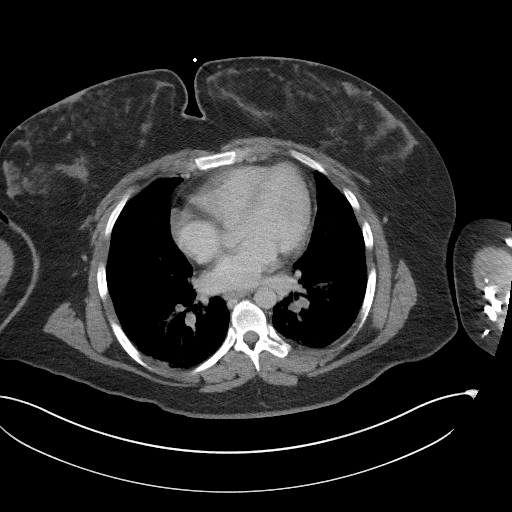

[Series 5: coronal st · coronal · 0.97mm/px · 3 of 112 slices shown]
[im 38/112  soft-tissue]
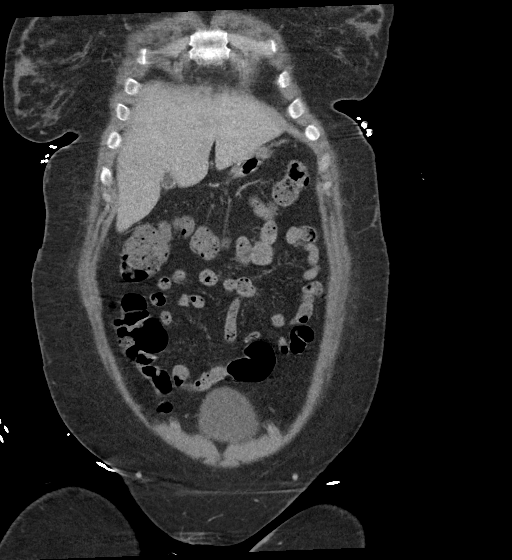
[im 50/112  soft-tissue]
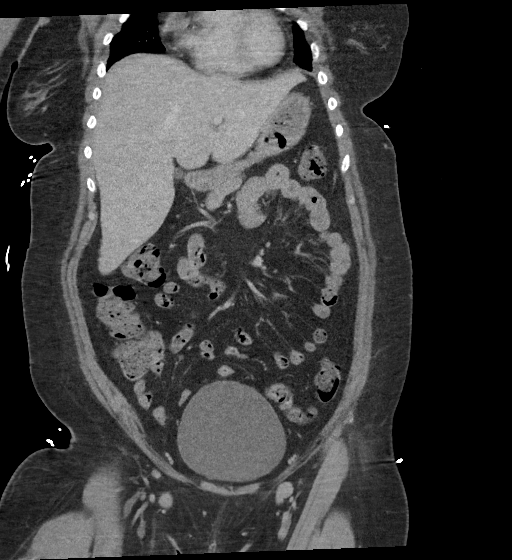
[im 62/112  soft-tissue]
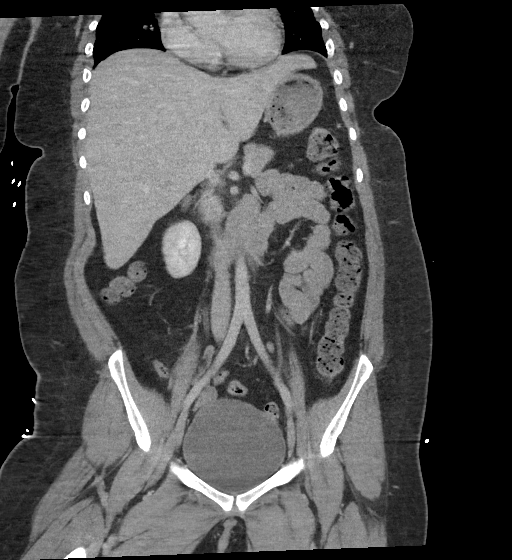

[15 of 46 positions shown; findings below may reference images not displayed]

RADIATION DOSE REDUCTION: This exam was performed according to the
departmental dose-optimization program which includes automated
exposure control, adjustment of the mA and/or kV according to
patient size and/or use of iterative reconstruction technique.

CONTRAST:  100mL OMNIPAQUE IOHEXOL 350 MG/ML SOLN
FINDINGS: CTA CHEST FINDINGS

Cardiovascular: Satisfactory opacification the bilateral pulmonary
arteries to the segmental level. Lobar/segmental pulmonary emboli in
the bilateral lower lobes (series 5/images 98, 111, and 136).
Overall clot burden is small to moderate. No evidence of right heart
strain.

No evidence of thoracic aortic aneurysm.

The heart is normal in size.  No pericardial effusion.

Mediastinum/Nodes: No suspicious mediastinal lymphadenopathy.

Visualized thyroid is unremarkable.

Lungs/Pleura: Scattered ground-glass opacity/mosaic attenuation in
the lungs bilaterally.

Mild bibasilar atelectasis.

No focal consolidation.

No suspicious pulmonary nodules, noting motion degradation.

No pleural effusion or pneumothorax.

Musculoskeletal: Visualized osseous structures are within normal
limits.

Review of the MIP images confirms the above findings.

CT ABDOMEN and PELVIS FINDINGS

Hepatobiliary: Liver is within normal limits.

Gallbladder is unremarkable. No intrahepatic or extrahepatic ductal
dilatation.

Pancreas: Within normal limits.

Spleen: Within normal limits.

Adrenals/Urinary Tract: Adrenal glands are within normal limits.

Kidneys are within normal limits.  No hydronephrosis.

Bladder is within normal limits.

Stomach/Bowel: Stomach is within limits.

No evidence of bowel obstruction.

Normal appendix (series 2/image 31).

No colonic wall thickening or inflammatory changes.

Vascular/Lymphatic: No evidence of abdominal aortic aneurysm.

No suspicious abdominopelvic lymphadenopathy.

Reproductive: Uterus is within normal limits.

Bilateral ovaries are within normal limits.

Other: No abdominopelvic ascites.

Musculoskeletal: Visualized osseous structures are within normal
limits.

Review of the MIP images confirms the above findings.
IMPRESSION: Lobar/segmental bilateral lower lobe pulmonary emboli. Overall clot
burden is small to moderate. No evidence of right heart strain.

Negative CT abdomen/pelvis.

Critical Value/emergent results were called by telephone at the time
of interpretation on 07/02/2021 at [DATE] to provider Dr Amppu,
who verbally acknowledged these results.

## 2022-12-03 NOTE — Unmapped (Signed)
Called, left voicemail    Thanks,  [employer]

## 2022-12-04 DIAGNOSIS — L732 Hidradenitis suppurativa: Principal | ICD-10-CM

## 2022-12-05 ENCOUNTER — Other Ambulatory Visit: Payer: Self-pay

## 2022-12-05 ENCOUNTER — Encounter (HOSPITAL_COMMUNITY): Payer: Self-pay

## 2022-12-05 ENCOUNTER — Emergency Department (HOSPITAL_BASED_OUTPATIENT_CLINIC_OR_DEPARTMENT_OTHER)
Admission: EM | Admit: 2022-12-05 | Discharge: 2022-12-06 | Disposition: A | Discharge status: Home / Self Care | Payer: BC Managed Care – PPO | Attending: Emergency Medicine | Admitting: Emergency Medicine

## 2022-12-05 ENCOUNTER — Encounter (HOSPITAL_BASED_OUTPATIENT_CLINIC_OR_DEPARTMENT_OTHER): Payer: Self-pay | Admitting: Emergency Medicine

## 2022-12-05 ENCOUNTER — Emergency Department (HOSPITAL_COMMUNITY)
Admission: EM | Admit: 2022-12-05 | Discharge: 2022-12-05 | Discharge status: Against Medical Advice | Payer: BC Managed Care – PPO | Attending: Emergency Medicine | Admitting: Emergency Medicine

## 2022-12-05 DIAGNOSIS — J869 Pyothorax without fistula: Secondary | ICD-10-CM

## 2022-12-05 DIAGNOSIS — Z5321 Procedure and treatment not carried out due to patient leaving prior to being seen by health care provider: Secondary | ICD-10-CM | POA: Insufficient documentation

## 2022-12-05 DIAGNOSIS — R509 Fever, unspecified: Secondary | ICD-10-CM | POA: Insufficient documentation

## 2022-12-05 DIAGNOSIS — L02213 Cutaneous abscess of chest wall: Secondary | ICD-10-CM | POA: Insufficient documentation

## 2022-12-05 DIAGNOSIS — Z7901 Long term (current) use of anticoagulants: Secondary | ICD-10-CM | POA: Insufficient documentation

## 2022-12-05 MED ORDER — LIDOCAINE HCL (PF) 1 % IJ SOLN
10.0000 mL | Freq: Once | INTRAMUSCULAR | Status: DC
  Filled 2022-12-05: qty 10

## 2022-12-05 NOTE — ED Triage Notes (Signed)
Pt has hx of Hidradenitis suppurativa (HS). Abscess filled Monday and started leaking Tuesday night. Yellow, foul smelling drainage. Area around abscess is swollen, red, and tender. Pt reports having a fever yesterday of 100.4, and chills. Pt reports taking tylenol with no relief of pain or fever.

## 2022-12-05 NOTE — ED Notes (Signed)
Pt stated, " I will be leaving AMA. I am just going to go to [facility]. "

## 2022-12-05 NOTE — ED Provider Notes (Signed)
[place] EMERGENCY DEPARTMENT AT [facility] Provider Note   CSN: [id number] Arrival date & time: 12/05/22  2036     History  Chief Complaint  Patient presents with   Cyst    Courtney [name] is a 32 y.o. female, history of hidradenitis suppurativa, who presents to the ED secondary to an abscess on her chest, this been going on for the last few days.  She states she has had fevers ranging from 99 to 101 F, and she was told by her primary care doctor, to come to the ER.  She has been taking doxycycline, for the last 4 days, with some improvement, but she has had some foul-smelling drainage, and was told to come to the ER to make sure she was not septic.  She states that she has a history of sepsis from hidradenitis suppurativa, and just wants to make sure that this is not occurring.    Home Medications Prior to Admission medications   Medication Sig Start Date End Date Taking? Authorizing Provider  albuterol (VENTOLIN HFA) 108 (90 Base) MCG/ACT inhaler TAKE 2 PUFFS BY MOUTH EVERY 6 HOURS AS NEEDED FOR WHEEZE OR SHORTNESS OF BREATH 07/05/22   Eden Emms, NP  apixaban (ELIQUIS) 5 MG TABS tablet Take by mouth. Patient not taking: Reported on 11/01/2022 11/10/21   [provider]  ARIPiprazole (ABILIFY) 2 MG tablet Take by mouth.    [provider]  Azelastine HCl 137 MCG/SPRAY SOLN PLACE 1 SPRAY INTO BOTH NOSTRILS 2 (TWO) TIMES DAILY. USE IN EACH NOSTRIL AS DIRECTED Patient not taking: Reported on 11/01/2022    [provider]  bifidobacterium infantis (ALIGN) capsule Take 1 capsule by mouth daily.    [provider]  BIMZELX 160 MG/ML pen Inject into the skin. 05/15/22   [provider]  busPIRone (BUSPAR) 10 MG tablet TAKE 1 TABLET BY MOUTH TWICE A DAY 02/06/22   Jomarie Longs, MD  cefdinir (OMNICEF) 300 MG capsule Take by mouth. Patient not taking: Reported on 11/01/2022    [provider]  celecoxib (CELEBREX) 200 MG capsule  Take 1 capsule (200 mg total) by mouth 2 (two) times daily. 09/08/22   Arthor Captain, PA-C  clindamycin (CLEOCIN) 300 MG capsule Take 300 mg by mouth 2 (two) times daily. 10/11/22   [provider]  diclofenac Sodium (VOLTAREN) 1 % GEL Apply 2 g topically 4 (four) times daily. 09/08/22   Arthor Captain, PA-C  diphenhydrAMINE (BENADRYL) 25 mg capsule 25 mg every 6 (six) hours as needed.    [provider]  doxycycline (VIBRA-TABS) 100 MG tablet Take by mouth. 08/21/22   [provider]  EPINEPHrine (EPIPEN 2-PAK) 0.3 mg/0.3 mL IJ SOAJ injection Inject 0.3 mg into the muscle as needed for anaphylaxis. 11/01/22   Eden Emms, NP  EPINEPHrine 0.3 mg/0.3 mL IJ SOAJ injection Inject 0.3 mg into the muscle as needed for anaphylaxis. 11/27/22   Charlynne Pander, MD  ertapenem [facility]) 1 g injection Inject into the vein. 04/03/21   [provider]  erythromycin ophthalmic ointment SMARTSIG:1 Sparingly In Eye(s) Every Night Patient not taking: Reported on 11/01/2022 08/14/22   [provider]  fluconazole (DIFLUCAN) 150 MG tablet Take 150 mg by mouth once. 05/15/22   [provider]  gabapentin (NEURONTIN) 600 MG tablet PLEASE SEE ATTACHED FOR DETAILED DIRECTIONS 10/16/22   [provider]  glucose blood (CONTOUR TEST) test strip And lancets #100 05/18/21   Romero Belling, MD  HYDROcodone-acetaminophen (  HYCET) 7.5-325 mg/15 ml solution Take 5mL every six hours as needed for pain. 10/24/22   Mardella Layman, MD  hydrOXYzine (ATARAX) 25 MG tablet TAKE 1 TABLET BY MOUTH EVERY 8 HOURS AS NEEDED FOR ITCHING. 01/16/22   Worthy Rancher B, FNP  inFLIXimab (REMICADE IV) Inject 1 Dose into the vein every 30 (thirty) days.    [provider]  inFLIXimab (REMICADE) 100 MG injection     [provider]  Iron-Vitamin C 100-250 MG TABS Take 1 tablet by mouth daily.    [provider]  lamoTRIgine (LAMICTAL) 100 MG tablet TAKE 1/2 TABLET TWICE A  DAY BY MOUTH 10/15/22   Jomarie Longs, MD  meloxicam (MOBIC) 7.5 MG tablet Take 7.5 mg by mouth 2 (two) times daily as needed. 09/20/22   [provider]  methylphenidate (RITALIN) 20 MG tablet Take 1 tablet (20 mg total) by mouth daily before breakfast. 11/20/22   Eden Emms, NP  Microlet Lancets MISC See admin instructions. 05/18/21   [provider]  montelukast (SINGULAIR) 10 MG tablet TAKE 1 TABLET BY MOUTH EVERYDAY AT BEDTIME 05/16/22   Eden Emms, NP  norethindrone (MICRONOR) 0.35 MG tablet Take 1 tablet by mouth daily. 07/19/21   [provider]  nystatin (MYCOSTATIN/NYSTOP) powder SMARTSIG:1 Topical Daily    [provider]  nystatin ointment (MYCOSTATIN) Apply topically 2 (two) times daily. 10/11/22   [provider]  ofloxacin (FLOXIN) 0.3 % OTIC solution PLACE 10 DROPS INTO BOTH EARS DAILY.    [provider]  ondansetron (ZOFRAN) 4 MG tablet Take 1 tablet (4 mg total) by mouth every 8 (eight) hours as needed for nausea or vomiting. 11/01/22   Eden Emms, NP  oxyCODONE (ROXICODONE) 5 MG immediate release tablet Take 1 tablet (5 mg total) by mouth every 6 (six) hours as needed for severe pain. 10/25/22   White, Elita Boone, NP  predniSONE (DELTASONE) 20 MG tablet Take 2 tablets (40 mg total) by mouth daily. 10/25/22   Valinda Hoar, NP  rizatriptan (MAXALT) 10 MG tablet See Admin Instructions. PLEASE SEE ATTACHED FOR DETAILED DIRECTIONS    [provider]  Secukinumab (COSENTYX SENSOREADY PEN) 150 MG/ML SOAJ Inject the contents of 2 pens (300 mg total) once weekly at weeks 0, 1, 2, 3, and 4. As loading dose. Patient not taking: Reported on 11/01/2022 08/14/21   [provider]  SUMAtriptan (IMITREX) 100 MG tablet Take by mouth. 10/03/22   [provider]  tobramycin-dexamethasone [facility]) ophthalmic solution 1 drop 4 (four) times daily. 08/14/22   [provider]  traMADol (ULTRAM) 50 MG tablet  Take by mouth. 08/08/21   [provider]  traZODone (DESYREL) 50 MG tablet TAKE 1 TABLET BY MOUTH EVERYDAY AT BEDTIME 11/10/21   Gweneth Dimitri, MD  triamcinolone ointment (KENALOG) 0.1 % PLEASE SEE ATTACHED FOR DETAILED DIRECTIONS    [provider]  venlafaxine (EFFEXOR) 75 MG tablet INCREASE EFFEXOR TO 37.5 MG IN THE MORNING AND 75 MG AT NIGHT FOR 1 WEEK, THEN INCREASE TO 75 MG TWICE DAILY FOR HEADACHES 10/29/22   [provider]      Allergies    Other, Shellfish allergy, Tapentadol, Tape, Ginger, Morphine and codeine, and Vancomycin    Review of Systems   Review of Systems  Constitutional:  Positive for fever.  Skin:  Positive for wound.    Physical Exam Updated Vital Signs BP (!) 118/96 (BP Location: Right Arm)   Pulse (!) 117  Temp 98.3 F (36.8 C)   Resp 18   SpO2 99%  Physical Exam Vitals and nursing note reviewed.  Constitutional:      General: She is not in acute distress.    Appearance: She is well-developed.  HENT:     Head: Normocephalic and atraumatic.  Eyes:     Conjunctiva/sclera: Conjunctivae normal.  Cardiovascular:     Rate and Rhythm: Normal rate and regular rhythm.     Heart sounds: No murmur heard. Pulmonary:     Effort: Pulmonary effort is normal. No respiratory distress.     Breath sounds: Normal breath sounds.  Abdominal:     Palpations: Abdomen is soft.     Tenderness: There is no abdominal tenderness.  Musculoskeletal:        General: No swelling.     Cervical back: Neck supple.  Skin:    General: Skin is warm and dry.     Capillary Refill: Capillary refill takes less than 2 seconds.     Comments: 2 cm x 2 cm fluctuant mass, on central chest, with tunnels present, and mild purulent drainage from communicating tract  Neurological:     Mental Status: She is alert.  Psychiatric:        Mood and Affect: Mood normal.     ED Results / Procedures / Treatments   Labs (all labs ordered are listed, but only abnormal  results are displayed) Labs Reviewed  CBC WITH DIFFERENTIAL/PLATELET - Abnormal; Notable for the following components:      Result Value   WBC 13.8 (*)    Hemoglobin 11.7 (*)    HCT 35.3 (*)    All other components within normal limits  LACTIC ACID, PLASMA  BASIC METABOLIC PANEL  LACTIC ACID, PLASMA    EKG None  Radiology No results found.  Procedures .Marland KitchenIncision and Drainage  Date/Time: 12/05/2022 11:36 PM  Performed by: Pete Pelt, PA Authorized by: Pete Pelt, PA   Consent:    Consent obtained:  Verbal   Consent given by:  Patient   Risks, benefits, and alternatives were discussed: yes     Risks discussed:  Bleeding, damage to other organs, infection, incomplete drainage and pain   Alternatives discussed:  No treatment Universal protocol:    Patient identity confirmed:  Verbally with patient Location:    Type:  Abscess   Size:  2x2 Pre-procedure details:    Skin preparation:  Povidone-iodine Sedation:    Sedation type:  None Anesthesia:    Anesthesia method:  Local infiltration   Local anesthetic:  Lidocaine 1% WITH epi and lidocaine 1% w/o epi Procedure type:    Complexity:  Simple Procedure details:    Drainage:  Purulent and bloody   Drainage amount:  Copious   Wound treatment:  Wound left open   Packing materials:  None Post-procedure details:    Procedure completion:  Tolerated     Medications Ordered in ED Medications  lidocaine (PF) (XYLOCAINE) 1 % injection 10 mL (has no administration in time range)    ED Course/ Medical Decision Making/ A&P                                 Medical Decision Making Patient is a 32 year old female, here for a wound on her chest, this been going on for the last few days.  She states that she has been on doxycycline for 4 days, it is  improving, but her doctor is worried because she has become septic from this, advised to go to the ER to get further evaluated.  She was nontachycardic on my exam, she  states she has been having fevers, but is afebrile on my exam, and has not recently taken Tylenol.  She is overall well-appearing.  I lanced her abscess on her chest, with copious drainage.  And we did not pack, per patient request, and placed Vaseline gauze, as well as an ABD pad over it, labs were ordered including CBC, CMP and lactic acid given patient's concern.  She is overall very well-appearing  Amount and/or Complexity of Data Reviewed Labs: ordered.    Details: Leukocytosis of 13K Discussion of management or test interpretation with external provider(s): Patient CBC shows mild leukocytosis of 13K, she was not tachycardic on my exam, however he was noted in triage she was.  She reports fevers of 99 to 101 Fahrenheit.  She is well-appearing on my exam.  Her abscess was drained, she is not hypotensive, and nontachycardic, exam.  Lactic acid was within normal limits, we discussed follow-up with her primary care doctor closely, and continue taking the doxycycline, twice a day for 10 more days.  She states she has now, as she has some at home.  Incision and drainage post care instructions given  Risk Prescription drug management.    Final Clinical Impression(s) / ED Diagnoses Final diagnoses:  Abscess of chest [facility])    Rx / DC Orders ED Discharge Orders     None         Acel Natzke L, PA 12/06/22 0028    Rozelle Logan, DO 12/10/22 1505

## 2022-12-05 NOTE — ED Triage Notes (Addendum)
Pt has hx of Hidradenitis suppurativa.  Is followed by her PCP for same.  Cyst to center of chest. Advised to come to ED for eval.  Pt has had drainage from the cyst.  It has gone down by her report but is still large.  Drainage noted at time of triage.  Foul smelling drainage.   Was at [facility] but left d/t wait times.   Pt is currently taking doxycycline as she has PRN rx for.  States they normally drain on their own and do not have to be lanced.  Pt also has a place on her bottom lip that is not resolving.

## 2022-12-06 LAB — LACTIC ACID, PLASMA: Lactic Acid, Venous: 0.8 mmol/L (ref 0.5–1.9)

## 2022-12-06 LAB — CBC WITH DIFFERENTIAL/PLATELET
Basophils Relative: 0.1 10*3/uL (ref 0.0–0.1)
Eosinophils Absolute: 1 10*3/uL (ref 0.0–0.5)
Eosinophils Relative: 0.3 10*3/uL (ref 0.0–0.5)
HCT: 35.3 % — ABNORMAL LOW (ref 36.0–46.0)
Hemoglobin: 11.7 g/dL — ABNORMAL LOW (ref 12.0–15.0)
Immature Granulocytes: 0 10*3/uL (ref 0.0–0.1)
Lymphocytes Relative: 38 %
Lymphs Abs: 5.3 10*3/uL — ABNORMAL HIGH (ref 0.7–4.0)
MCH: 29.5 pg (ref 26.0–34.0)
MCHC: 33.1 g/dL (ref 30.0–36.0)
MCV: 89.1 fL (ref 80.0–100.0)
Monocytes Absolute: 2 10*3/uL (ref 0.1–1.0)
Monocytes Relative: 1.4 10*3/uL — ABNORMAL HIGH (ref 0.1–1.0)
Monocytes Relative: 10 10*3/uL (ref 0.7–4.0)
Neutro Abs: 6.6 10*3/uL (ref 1.7–7.7)
Neutrophils Relative %: 49 %
Platelets: 328 10*3/uL (ref 150–400)
RBC: 3.96 MIL/uL (ref 3.87–5.11)
RDW: 13.5 % (ref 11.5–15.5)
WBC Morphology: 0.06 10*3/uL (ref 0.00–0.07)
WBC: 13.8 10*3/uL — ABNORMAL HIGH (ref 4.0–10.5)
nRBC: 0 % (ref 0.0–0.2)

## 2022-12-06 LAB — BASIC METABOLIC PANEL
Anion gap: 9 (ref 5–15)
BUN: 12 mg/dL (ref 6–20)
CO2: 24 mmol/L (ref 22–32)
Calcium: 8.9 mg/dL (ref 8.9–10.3)
Chloride: 105 mmol/L (ref 98–111)
Creatinine, Ser: 0.73 mg/dL (ref 0.44–1.00)
GFR, Estimated: >60 mL/min (ref >60)
Glucose, Bld: 83 mg/dL (ref 70–99)
Potassium: 3.5 mmol/L (ref 3.5–5.1)
Sodium: 138 mmol/L (ref 135–145)

## 2022-12-06 NOTE — Discharge Instructions (Addendum)
Please follow-up with your primary care doctor, your dermatologist.  I incised and drained, the area, with good drainage.  Return to the ER if it becomes red, swollen, or your fevers are persistent, or you become very lightheaded dizzy, or ill-appearing continue taking your doxycycline, for at least 10 days.

## 2022-12-10 ENCOUNTER — Encounter: Payer: Self-pay | Admitting: Nurse Practitioner

## 2022-12-10 ENCOUNTER — Ambulatory Visit (INDEPENDENT_AMBULATORY_CARE_PROVIDER_SITE_OTHER): Payer: BC Managed Care – PPO | Admitting: Nurse Practitioner

## 2022-12-10 VITALS — BP 100/72 | HR 97 | Temp 98.2°F | Ht 61.0 in | Wt 197.0 lb

## 2022-12-10 DIAGNOSIS — Z13220 Encounter for screening for lipoid disorders: Secondary | ICD-10-CM | POA: Diagnosis not present

## 2022-12-10 DIAGNOSIS — R5383 Other fatigue: Secondary | ICD-10-CM

## 2022-12-10 DIAGNOSIS — J4520 Mild intermittent asthma, uncomplicated: Secondary | ICD-10-CM | POA: Diagnosis not present

## 2022-12-10 DIAGNOSIS — F901 Attention-deficit hyperactivity disorder, predominantly hyperactive type: Secondary | ICD-10-CM

## 2022-12-10 DIAGNOSIS — E559 Vitamin D deficiency, unspecified: Secondary | ICD-10-CM

## 2022-12-10 DIAGNOSIS — Z0000 Encounter for general adult medical examination without abnormal findings: Secondary | ICD-10-CM | POA: Diagnosis not present

## 2022-12-10 DIAGNOSIS — R7303 Prediabetes: Secondary | ICD-10-CM

## 2022-12-10 DIAGNOSIS — F1510 Other stimulant abuse, uncomplicated: Secondary | ICD-10-CM

## 2022-12-10 DIAGNOSIS — L732 Hidradenitis suppurativa: Secondary | ICD-10-CM | POA: Diagnosis not present

## 2022-12-10 DIAGNOSIS — E669 Obesity, unspecified: Secondary | ICD-10-CM | POA: Diagnosis not present

## 2022-12-10 DIAGNOSIS — G4700 Insomnia, unspecified: Secondary | ICD-10-CM

## 2022-12-10 DIAGNOSIS — F3181 Bipolar II disorder: Secondary | ICD-10-CM

## 2022-12-10 DIAGNOSIS — Z91013 Allergy to seafood: Secondary | ICD-10-CM

## 2022-12-10 NOTE — Assessment & Plan Note (Signed)
Check basic labs inclusive of vitamin D and B12

## 2022-12-10 NOTE — Assessment & Plan Note (Signed)
 History of same pending vitamin D level today

## 2022-12-10 NOTE — Assessment & Plan Note (Signed)
Currently maintained on methylphenidate 20 mg.  PDMP reviewed no red flags last UDS within normal limits continue

## 2022-12-10 NOTE — Assessment & Plan Note (Signed)
Patient currently followed by psychiatry on Abilify and Lamictal.  Continue taking medication as prescribed continue following up with psychiatry as recommended.  Patient denies HI/SI/AVH

## 2022-12-10 NOTE — Assessment & Plan Note (Signed)
Patient states she has relapsed since been clean.  Last UDS was negative for illicit substances.

## 2022-12-10 NOTE — Assessment & Plan Note (Signed)
Discussed age-appropriate immunizations and screening exams.  Did review patient's personal, surgical, social, family histories.  Patient is up-to-date on all age-appropriate vaccinations she would like.  Patient is too young for CRC screening or breast cancer screening.  She is up-to-date on her cervical cancer screening.  Patient was given information at discharge about preventative healthcare maintenance with anticipatory guidance

## 2022-12-10 NOTE — Progress Notes (Signed)
Established Patient Office Visit  Subjective   Patient ID: ZANITA REDBIRD, female    DOB: 06/05/1990  Age: 32 y.o. MRN: [id number]  Chief Complaint  Patient presents with   Annual Exam    Blood work for A1C, vitamin -b12 and vitamin D.   Referral    [facility]     HPI  PE: has been seen by heme and completed 6 month course of anti coagulation. She is no longer on anti-coagulation   Asthma: states that she has been using it more with wlaking. Outside of that she has not been using it. And on singular and antinhistamine  [facility]: Dr Laurence Spates manages the Abilify and Lamictal.   ADD/ADHD: patient is currently on methylphenidate. She did have a have a releapse with methamphetamines per her report. State that she is scheduled to see [facility] pain [facility] and is interested in suboxone/subutex   Migraine: followed by [facility] neurology. States that she thinks she is on the imitrex but is unsure they also manage the effexor  Hidranitis Suppratevia : followed by [facility]  for complete physical and follow up of chronic conditions. She is currently on remicade and Bimzelx  [facility]: pain management  Immunizations: -Tetanus: Completed in 2018 -Influenza: up to date -Shingles: too young -Pneumonia: too young -hpv: utd  Diet: Fair diet. States that she is eating 2 meals a day sometimes 3  meals a day. States mostly healthy. Water and dr. Reino Kent. Red bull sometimes  Exercise:  states that 30 mins a day every day   Eye exam: Completes annually.. Last month. Glasses and contact   Dental exam: Completes semi-annually    Colonoscopy: Too young  Lung Cancer Screening: NA   Pap smear: Feb. Dr Chestine Spore at [facility]  Mammograms: too young currently average risk   Sleep: statse that she it varies. States that she felt tired today. Feels rested when she gets enough sleep       Review of Systems  Constitutional:  Negative for chills and fever.  Respiratory:   Negative for shortness of breath.   Cardiovascular:  Negative for chest pain and leg swelling.  Gastrointestinal:  Negative for abdominal pain, blood in stool, constipation, diarrhea, nausea and vomiting.       Bm daily   Genitourinary:  Negative for dysuria and hematuria.  Neurological:  Positive for tingling and headaches.  Psychiatric/Behavioral:  Negative for hallucinations and suicidal ideas.       Objective:     BP 100/72   Pulse 97   Temp 98.2 F (36.8 C) (Oral)   Ht 5\' 1"  (1.549 m)   Wt 197 lb (89.4 kg)   LMP 12/06/2022 (Approximate)   SpO2 97%   BMI 37.22 kg/m  BP Readings from Last 3 Encounters:  12/10/22 100/72  12/05/22 (!) 118/96  12/05/22 137/83   Wt Readings from Last 3 Encounters:  12/10/22 197 lb (89.4 kg)  12/05/22 197 lb (89.4 kg)  11/28/22 197 lb (89.4 kg)      Physical Exam Vitals and nursing note reviewed.  Constitutional:      Appearance: Normal appearance.  HENT:     Right Ear: Tympanic membrane, ear canal and external ear normal.     Left Ear: Tympanic membrane, ear canal and external ear normal.     Mouth/Throat:     Mouth: Mucous membranes are moist.     Pharynx: Oropharynx is clear.  Eyes:     Extraocular Movements: Extraocular movements intact.  Pupils: Pupils are equal, round, and reactive to light.  Cardiovascular:     Rate and Rhythm: Normal rate and regular rhythm.     Pulses: Normal pulses.     Heart sounds: Normal heart sounds.  Pulmonary:     Effort: Pulmonary effort is normal.     Breath sounds: Normal breath sounds.  Abdominal:     General: Bowel sounds are normal. There is no distension.     Palpations: There is no mass.     Tenderness: There is no abdominal tenderness.     Hernia: No hernia is present.  Musculoskeletal:     Right lower leg: No edema.     Left lower leg: No edema.  Lymphadenopathy:     Cervical: No cervical adenopathy.  Skin:    General: Skin is warm.  Neurological:     General: No focal  deficit present.     Mental Status: She is alert.     Deep Tendon Reflexes:     Reflex Scores:      Bicep reflexes are 2+ on the right side and 2+ on the left side.      Patellar reflexes are 2+ on the right side and 2+ on the left side.    Comments: Bilateral upper and lower extremity strength 5/5  Psychiatric:        Mood and Affect: Mood normal.        Behavior: Behavior normal.        Thought Content: Thought content normal.        Judgment: Judgment normal.      No results found for any visits on 12/10/22.    The ASCVD Risk score (Arnett DK, et al., 2019) failed to calculate for the following reasons:   The 2019 ASCVD risk score is only valid for ages 51 to 67    Assessment & Plan:   Problem List Items Addressed This Visit       Respiratory   Reactive airway disease    Patient is currently maintained on albuterol, sceond generation antihistamine, and singular. She has been using the albuterol inhaler more as of late. If continued use she will need a maintenance inhaler.         Musculoskeletal and Integument   Hidradenitis suppurativa    Patient currently maintained on Remicade, bimzelx.  She is currently followed by [facility] dermatology.  Continue taking medication as prescribed follow-up with specialist as recommended        Other   Attention deficit hyperactivity disorder (ADHD), predominantly hyperactive type    Currently maintained on methylphenidate 20 mg.  PDMP reviewed no red flags last UDS within normal limits continue      Relevant Orders   TSH   Other fatigue    Check basic labs inclusive of vitamin D and B12      Relevant Orders   Vitamin B12   VITAMIN D 25 Hydroxy (Vit-D Deficiency, Fractures)   Vitamin D deficiency    History of same pending vitamin D level today      Relevant Orders   VITAMIN D 25 Hydroxy (Vit-D Deficiency, Fractures)   Preventative health care - Primary    Discussed age-appropriate immunizations and screening exams.  Did  review patient's personal, surgical, social, family histories.  Patient is up-to-date on all age-appropriate vaccinations she would like.  Patient is too young for CRC screening or breast cancer screening.  She is up-to-date on her cervical cancer screening.  Patient was given information  at discharge about preventative healthcare maintenance with anticipatory guidance      Relevant Orders   CBC   Comprehensive metabolic panel   TSH   Bipolar 2 disorder, major depressive episode [facility])    Patient currently followed by psychiatry on Abilify and Lamictal.  Continue taking medication as prescribed continue following up with psychiatry as recommended.  Patient denies HI/SI/AVH      Insomnia    Patient currently maintained on trazodone.  Continue      Prediabetes    History of the same.  Pending A1c      Relevant Orders   Hemoglobin A1c   Lipid panel   Shellfish allergy    History of same has been evaluated by allergist in the past.  Does have an EpiPen at home.  To episodes of anaphylaxis this past month will be referred to allergy specialist.      Relevant Orders   Ambulatory referral to Allergy   Methamphetamine use [facility])    Patient states she has relapsed since been clean.  Last UDS was negative for illicit substances.      Other Visit Diagnoses     Obesity (BMI 30-39.9)       Relevant Orders   Hemoglobin A1c   Lipid panel   Screening for lipid disorders       Relevant Orders   Lipid panel       Return in about 3 months (around 03/12/2023) for ADD/ADHD medication recheck .    Audria Nine, NP

## 2022-12-10 NOTE — Assessment & Plan Note (Signed)
Patient is currently maintained on albuterol, sceond generation antihistamine, and singular. She has been using the albuterol inhaler more as of late. If continued use she will need a maintenance inhaler.

## 2022-12-10 NOTE — Assessment & Plan Note (Signed)
History of the same.  Pending A1c

## 2022-12-10 NOTE — Assessment & Plan Note (Signed)
Patient currently maintained on Remicade, bimzelx.  She is currently followed by [facility] dermatology.  Continue taking medication as prescribed follow-up with specialist as recommended

## 2022-12-10 NOTE — Assessment & Plan Note (Signed)
Patient currently maintained on trazodone.  Continue

## 2022-12-10 NOTE — Assessment & Plan Note (Signed)
History of same has been evaluated by allergist in the past.  Does have an EpiPen at home.  To episodes of anaphylaxis this past month will be referred to allergy specialist.

## 2022-12-10 NOTE — Patient Instructions (Signed)
Nice to see you today I will be in touch with the labs once I have them Follow up with me in 3 months for ADD/ADHD recheck, sooner if you need me

## 2022-12-11 DIAGNOSIS — L732 Hidradenitis suppurativa: Principal | ICD-10-CM

## 2022-12-11 DIAGNOSIS — H9211 Otorrhea, right ear: Secondary | ICD-10-CM | POA: Diagnosis not present

## 2022-12-11 DIAGNOSIS — L299 Pruritus, unspecified: Secondary | ICD-10-CM | POA: Diagnosis not present

## 2022-12-11 LAB — COMPREHENSIVE METABOLIC PANEL
ALT: 13 U/L (ref 0–35)
AST: 11 U/L (ref 0–37)
Albumin: 3.4 g/dL — ABNORMAL LOW (ref 3.5–5.2)
Alkaline Phosphatase: 104 U/L (ref 39–117)
BUN: 7 mg/dL (ref 6–23)
CO2: 27 meq/L (ref 19–32)
Calcium: 8.8 mg/dL (ref 8.4–10.5)
Chloride: 104 meq/L (ref 96–112)
Creatinine, Ser: 0.75 mg/dL (ref 0.40–1.20)
GFR: 107.16 mL/min (ref >60.00)
Glucose, Bld: 70 mg/dL (ref 70–99)
Potassium: 3.7 meq/L (ref 3.5–5.1)
Sodium: 139 meq/L (ref 135–145)
Total Bilirubin: 0.2 mg/dL (ref 0.2–1.2)
Total Protein: 7.5 g/dL (ref 6.0–8.3)

## 2022-12-11 LAB — LIPID PANEL
Cholesterol: 169 mg/dL (ref 0–200)
HDL: 40.1 mg/dL (ref >39.00)
LDL Cholesterol: 102 mg/dL — ABNORMAL HIGH (ref 0–99)
NonHDL: 128.94
Total CHOL/HDL Ratio: 4
Triglycerides: 133 mg/dL (ref 0.0–149.0)
VLDL: 26.6 mg/dL (ref 0.0–40.0)

## 2022-12-11 LAB — VITAMIN B12: Vitamin B-12: 452 pg/mL (ref 211–911)

## 2022-12-11 LAB — CBC
HCT: 37.7 % (ref 36.0–46.0)
Hemoglobin: 12.3 g/dL (ref 12.0–15.0)
MCHC: 32.7 g/dL (ref 30.0–36.0)
MCV: 89.7 fL (ref 78.0–100.0)
Platelets: 332 10*3/uL (ref 150.0–400.0)
RBC: 4.2 Mil/uL (ref 3.87–5.11)
RDW: 13.8 % (ref 11.5–15.5)
WBC: 12.8 10*3/uL — ABNORMAL HIGH (ref 4.0–10.5)

## 2022-12-11 LAB — HEMOGLOBIN A1C: Hgb A1c MFr Bld: 5.5 % (ref 4.6–6.5)

## 2022-12-11 LAB — VITAMIN D 25 HYDROXY (VIT D DEFICIENCY, FRACTURES): VITD: 21.65 ng/mL — ABNORMAL LOW (ref 30.00–100.00)

## 2022-12-11 LAB — TSH: TSH: 0.51 u[IU]/mL (ref 0.35–5.50)

## 2022-12-11 MED ORDER — DOXYCYCLINE HYCLATE 100 MG TABLET
ORAL_TABLET | Freq: Two times a day (BID) | ORAL | 0 refills | 30.00000 days | Status: CP
Start: 2022-12-11 — End: ?

## 2022-12-12 ENCOUNTER — Encounter: Payer: Self-pay | Admitting: *Deleted

## 2022-12-12 ENCOUNTER — Other Ambulatory Visit: Payer: Self-pay | Admitting: Nurse Practitioner

## 2022-12-12 DIAGNOSIS — E559 Vitamin D deficiency, unspecified: Secondary | ICD-10-CM

## 2022-12-12 MED ORDER — VITAMIN D (ERGOCALCIFEROL) 1.25 MG (50000 UNIT) PO CAPS: 50000.0000 [IU] | ORAL_CAPSULE | ORAL | 0 refills | Status: DC

## 2022-12-17 ENCOUNTER — Ambulatory Visit
Admit: 2022-12-17 | Discharge: 2022-12-18 | Payer: PRIVATE HEALTH INSURANCE | Attending: Nurse Practitioner | Primary: Nurse Practitioner

## 2022-12-17 DIAGNOSIS — G8929 Other chronic pain: Principal | ICD-10-CM

## 2022-12-17 DIAGNOSIS — L732 Hidradenitis suppurativa: Principal | ICD-10-CM

## 2022-12-17 DIAGNOSIS — F1910 Other psychoactive substance abuse, uncomplicated: Principal | ICD-10-CM

## 2022-12-17 DIAGNOSIS — M792 Neuralgia and neuritis, unspecified: Principal | ICD-10-CM

## 2022-12-17 DIAGNOSIS — R52 Pain, unspecified: Principal | ICD-10-CM

## 2022-12-17 DIAGNOSIS — T148XXA Other injury of unspecified body region, initial encounter: Principal | ICD-10-CM

## 2022-12-17 MED ORDER — GABAPENTIN 600 MG TABLET
ORAL_TABLET | Freq: Three times a day (TID) | ORAL | 5 refills | 30 days | Status: CP
Start: 2022-12-17 — End: 2023-06-15

## 2022-12-17 MED ORDER — MELOXICAM 7.5 MG TABLET
ORAL_TABLET | Freq: Two times a day (BID) | ORAL | 5 refills | 30 days | Status: CP | PRN
Start: 2022-12-17 — End: 2023-06-15

## 2022-12-17 NOTE — Unmapped (Addendum)
I will try ordering a referral to [facility] substance abuse clinic - you should hopefully hear from someone within the next week or two about getting in there.     I will also try referring to [facility] which is also in [facility] (Dr. Renold Genta or Aggie Cosier, PA-C)   Phone Number: [phone]         Substance Abuse Services and Information  Where to go for:  DWI Services:  Does not require a referral and must be paid for out of pocket.  [facility] ([facility]): [phone]  DWI assessments. Short and long term treatment.  This agency is not an ADETS provider  [employer] [facility]: [phone]  DWI assessments. Substance abuse treatment. Short and long-term treatments. ADETS. Bilingual services are available.     Public Health Department   [address] [place], [place] [address]  Phone: [phone]    [facility]  Counseling & Mental Health  [address] [place] ?? [phone]    [facility] Mental Health  [address] [place], [place] ?? [phone]    [facility]  [address] [place], [place] [address]   [phone]    [facility] HILL - DAY TREATMENT   [facility]  [address]  [place], [place] [address]  Phone: [phone]    [facility]  Counseling & mental health  [address] Fidel Levy [facility] ?? [phone]  [address] [place], [place] [address]   [phone]    [facility] (VARIOUS LOCATION)   [address] Kathie Rhodes [place], [place] [address]   [phone]    [facility] (MAT AND SA-OUTPATIENT SERVICES)   [address] [place], [place] [address]   [phone]        [facility] [place]) - Takes mental health clients and accepts ALL insurances and uninsured.  Does therapy and medication management. To get appointment call [phone]    [address] [place]) - Takes all substance abuse clients for [place], mental health clients for therapy, and medication management services and accepts Medicaid, [employer], and Uninsured people.  Walk in intakes are Tuesday mornings at 9am-12pm, or call for an appointment at [phone].    [facility]) - Med Management, therapy, Suboxone, for substance abuse and mental health clients.  Accepts all private insurances, all Medicare's, and Medicaid.  Call [phone] to schedule.    [facility] Ebony Cargo) - Methadone, Buprenorphine, and SAIOP clinic.  Patient must call [phone] to complete brief phone intake and can then dose as early as 5:30am the next morning.    [facility] [place] ([place]) - Offers services to adults and minors. Offers therapy, medication management, and Intensive In-Home.  Accepts uninsured and Medicaid insured individuals.  Call [phone] to schedule intake.    [facility]) - Offers services to adults and minors. Offers therapy, medication management, and Intensive In Home.  Accepts uninsured and Medicaid insured individuals.  Call [phone] to schedule intake.    [facility] [facility]) - Provides Crisis services, Medication evaluations, and connection to services. Accepts walk-ins Monday - Thursday 8am to 10pm, Friday 8am to 8pm, and Saturday - Sunday 8am to 5pm for one time medication management appointment. They accept Medicaid, some private insurance, and uninsured individuals. Walk-in or call for an appointment at [phone] or [phone].  If you need additional help accessing Mental Health Services you can call [facility] which is 24/7. The number is [phone]       [facility], [place], [place]) - Medication management and therapy for adults with private insurance or Medicaid.  Call [phone] or request an appointment online at [email or web address].     [facility])- Medication Management, therapy, Substance Use services. Walk-in clinic Monday-Friday between 8am-2pm. Or call for an appointment, [phone].     [facility]): Therapy for adults and children that incorporate animals into therapy. Address: [address] [place], [place] [address]. Phone: [phone]     [facility] [place]): Has several other locations in the [place] area. Therapy only, no medication management. Address: [address] [place], [place] [address]. Phone: [phone].     [facility]): Provides therapy, intensive in-home, medication management, and other higher levels of care to children and adolescents. Phone: [phone]. Fax: [phone]. Address: [address], [place], [place] [address]    [facility]): Provides therapy, substance use treatment, medication management, and medical care to adults and seniors with commercial insurance, [place], and [address]. Phone: [phone].    [facility]: [phone]- offers individual substance abuse treatment. They accept most private insurances, Medicaid, and self pay clients.   [facility]: [phone]- Offers individual substance abuse treatment. They accept most private insurances, Medicaid, and self pay clients.  [facility] [employer] or C-BLAC: [phone]- Offers Intensive Outpatient Treatment as well as Comprehensive Outpatient Treatment for consumer with substance abuse problems.  [facility]: [phone]- Provides individual and group counseling for consumers with substance abuse issues. They accept most private insurances, Medicaid, and self pay clients. Bilingual services are available.  [facility]: [phone]- Offers Comprehensive Outpatient treatment for current pregnant women and postpartum women up to 6 months. (Must be a current client living in their residential facility). Call to inquire about services.  [facility] [place] - They have multiple locations. Please call the number to complete the intake. [phone]  [facility]: [phone]- Offers individual substance abuse treatment. They accept most private insurances, Medicaid, and self pay clients.  [facility] (Various Locations - [place], [place]): [phone] or [phone]- Provides individual and group therapy for consumers with substance abuse issues.   [facility]): [phone]- Provides individual and group therapy for consumers with substance abuse issues.      Residential Substance Abuse Treatment Programs include, but are not limited to:     [employer]: [employer] (Women only) Claris Gower: [phone] Residential program for Women. Participants must call for themselves to complete screening.   73-month inpatient program for substance use/mental health treatment  FREE no insurance needed   Must be medically stable to attend   Call Admissions at [phone] [phone]) ext. [phone]  When you call you will be asked about the following:   Previous admissions to programs  The last time you used drugs or alcohol  The reason you are seeking treatment currently  Your current legal status  Your current medical condition  Your current psychiatric condition  Your ability to participate in therapeutic work  Any anticipated needs beyond what the program offers    [employer]: [facility] (Men only): [phone] Residential program for men. Participants must call for themselves to complete screening.  53-month inpatient program for substance use/mental health treatment  FREE no insurance needed.   Must  be medically stable to attend.   Call Admissions at [phone] [phone]) ext. [phone]  When you call you will be asked about the following:   Previous admissions to programs  The last time you used drugs or alcohol.  The reason you are seeking treatment currently.  Your current legal status  Your current medical condition  Your current psychiatric condition  Your ability to participate in therapeutic work.  Any anticipated needs beyond what the program offers.  [employer] [facility] [employer] is the [employer] 40-month holistic program designed to help people overcome a lifestyle of addictions and return to the workforce investing back into the community.  Admissions [phone] ext. [phone]  [facility] [employer] [facility]): treat men and women struggling with alcohol, opiates, cocaine, heroin, crystal meth, and marijuana, as well as other mind- and mood-altering drugs. Their staff is also trained in the diagnosis and treatment of co-occurring disorders such as depression, bipolar disorder, panic disorder, post-traumatic stress disorder, attention deficit disorder, and personality disorders. The psychiatrist is board certified in Addiction Medicine and is a full-time member of the staff. You can maximize your overall health and well-being by addressing addiction while simultaneously treating co-occurring mental health issues that may be present. The accept many commercial insurance plans and some standard Medicaid plans. Address: [address] [place], [place] [address]. Phone: [phone].   [facility] ([place]): [phone]- A bilingual residential facility for adult males. The [employer] accepts private insurance, [place], and self-pay on sliding scale.   [facility] (Men's facility) [facility]: [phone]- A 180 bed facility that offers a multitude of services including: overnight emergency shelter, detox facility, residential recovery program, and health care services.    (Call before 4pm on weekdays only)  Request intake for admission  Tell them you are homeless in [place].  Offer shelter and inpatient substance use treatment.  FREE, no insurance needed.     [facility] (Women's facility) [place]: [phone]- A 99 bed facility that offers a multitude of services including: overnight emergency shelter, detox facility, residential recovery program, and health care services.  Request intake for admission  Tell them you are homeless in [place].  Offer shelter and inpatient substance use treatment.  FREE, no insurance needed.   [facility] - Several programs for addiction recovery  Admissions [phone]  [email or web address]  [employer] - Halfway houses for substance use recovery. Small weekly fee to reside at the [facility].Offered all over [place]. [email or web address]  Go to website above for vacancy information.   [facility] [place]): [phone]- 18 months residential treatment center.   [facility] [place] - They have multiple locations. Please call the number to complete the intake. [phone]  [facility]  Offers to cover cost of an [employer] if enrolled and participating in their substance use treatment program. (Men and Women)  For more information call [phone] or message [email or web address]  [facility]: [facility] residential program provides treatment for women who are pregnant and women with children ages 41 months to 42 years old. Each woman who comes into the program receives treatment for substance use and/or mental health disorder. Entrance into the program demonstrates the mother's first step to addressing substance use, and her commitment to her journey of sustained recovery and being a better parent.  CALL  [phone] ext [phone] or [phone]. You can complete a referral online at [email or web address]  [facility] (  [place]): [phone]- 2-year residential program for men and women. Requires interview prior to admission. Consumer must be able to work.  Warnell Bureau. Yetta Barre ([facility]ATC) Jinny Blossom: * Consumers must be referred by [facility].   [employer]): [phone]- 21-day residential program. Accepts private insurance only.   [facility] [place]): [phone]- Offers residential substance abuse treatment for adults.       Residential Substance Abuse Treatment Programs for Adolescents:   For some of the residential services described below, if you have private insurance or Medicaid, you can call and set up an appointment directly. Without insurance you must first contact [facility] to be assessed for a referral.   [facility] [place]) : [phone]- Offers residential substance abuse treatment for adolescents.   [facility] Ri[facility]): [phone]- Offers residential substance abuse treatment for adolescents.  [facility] [employer]. [place]): [phone]- Offers residential substance abuse treatment for adolescents.      The [employer] are available to [employer] only.  [facility] Treatment Program: [facility] [phone]  [facility]: [place] [phone]  [facility] Substance Abuse Treatment Program: [place] Boehringer [phone]    Community Substance Abuse Services:   Gwyneth Revels AA/NA Meetings: see AA Meetings or visit the following internet sites.   NA meetings at http://portaltools.na.org/portaltools/MeetingLov/  http://www.alcoholics-[email or web address]/  [facility]: [phone] A peer support center that offers a variety of support groups and services for adults with mental health or substance abuse issues.

## 2022-12-17 NOTE — Unmapped (Signed)
Date of Visit:  12/17/2022    CHIEF COMPLAINT: Pain at chest, wrist, groins and under breast.     PAIN DESCRIPTION: Melissa Pearson is a 32 y.o. female who is here today for a follow-up appointment.     The pain is located at the aforementioned areas. During the past week, the pain has been on average a 9/10 in intensity. Pain has interfered with enjoyment of life 9/10, with 0 meaning not at all and 10/10 being complete interference. The pain has interfered with general activity 9/10, with 0/10 meaning not at all and 10/10 meaning complete interference. The PEG Score at today's visit is: 9.      The pain is described as throbbing, shooting, sharp, hot/burning, aching, and stabbing. Things that make it better are heat, cold, massage, and medications, and things that make it worse are lying down, sitting, and coughing. The pain treatments we are providing allow the patient to function better in the following aspects: general activity and enjoyment in life.       At her last visit, she was increased to gabapentin 600mg  /600mg / 1200mg  a day for management of her HS related pain - she felt that it made her tired and going to gabapentin 600mg  TID is better. Also taking meloxicam 7.5mg  1-2 x a day and this does help as well.     She is open and honest that she has addiction to opioids and meth & cocaine. Was purchasing off the street. Had hospital visit in August for this. Now clean and requesting assistance with finding outpatient buprenorphine clinic to remain sober.   She had to recently have another excision off her chest wall  Conitnues to follow with derm - has had to use some very PRN opioid medicines but her mother is managing it for her.     ----  Referring Provider: Kerby Nora Sayed,MD  PCP: Dayton Bailiff, MD  Date of Initial consult: 06/07/2022  PSYCH: [facility]     PAIN HISTORY: ( SB /   ) Very pleasant patient referred for evaluation of chronic pain associated with wounds from Stage 3 hidradentitis suppurativa. Has wounds throughout her axilla, under breasts, groins and buttocks. Follows with dermatology who manage her cosentyx and remicade infusions. Works full time as an Museum/gallery exhibitions officer and tries to not let the pain limit her. She follows with psychiatry for her Bipolar and A&D. She has had a few small rxs for norco 5mg  to use sparingly for pain and ideally hopes to use as last resort as she has had friends who have overdosed and concerns for that.     SOC Hx: single, lives with mother and grandmother, works full time as EMT, denies etoh or drug use , has friends that have had hx of substance abuse and overdose on opioids so hopes to avoid as much as possible   PMHx: Bipolar, Hidradenitis Suprrativa, hx of PE due to OCP but has come off eliquis  CPS Plan: UDS (can consider Norco or percocet 5mg  #20 a month PRN), gabapentin trial, meloxicam,     PAST PAIN RELEVANT SURGERIES:  03/2017 - excision of axilla sweat glands  03/2022 - excision of groin sweat glands      PAST PAIN TREATMENTS TRIED:  ice/heat therapy  pain medications  physical therapy ([facility])   surgery    PAST INJECTIONS DONE:      PERTINENT RADIOGRAPHIC STUDIES:      PAST MEDICATIONS TRIED AND EFFECT:  Class  Med Tried-Response   OTC  Acetaminophen   NSAIDs Ibuprofen   Meloxicam 7.5mg  bid prn (some help)     Weak mu agonist  Tramadol (not much help)    Anti-epileptic  Gabapentin 600 mg 3 times a day   Tricicylics, other antidepr.  Abilify 5mg  (prescribed by psych)   Venlafaxine (prescribed by psych)   Lamictal 100mg  bid   Buspar 10mg     Muscle relaxant     Topicals     Miscellaneous  Invanz injection    Remicade infusions  Ritalin (prescribed by psych)   Cosentx  Hydroxyzine    Opioids  Tapentaol (allergy)   Norco 5mg  (minor relief)   Morphine (allergy- became angry)      The following measures were taken to ensure proper prescribing of opioid medications:  - Opioid agreement was signed on:  N/A - Patient is NOT under a current pain contract with [facility]. We do NOT manage this patient's opioid medications. NOT CONSIDERING OPIOIDS     - Baseline PEG (06/07/2022) IF off opioids: 10    - Last Psych screening test done: 06/07/2022      Result: ORT = 4 (moderate risk)     - [place] reviewed and any notable findings:   06/07/2022 - ok    - Last urine drug screen:   06/07/2022 - baseline UDS expect ritalin, gabapentin and hydrocodone - ok for Ritalin but inappropriately positive for METHAMPHETAMINE AND TRAMADOL --no hydrocodone as was expecting.      - NCO Database accessed:     - Current MME if prescribed opioids:     - H/O Aberrancies:     2024 ANNUAL MIPS REPORTING MEASURES (Last done: 06/07/2022)    # Description Screening Result Plan   47 Advance Care Plan (ACP)  % of patients  > 58 yo who have an advance care plan or surrogate decision maker documented in the med record or documentation an ACP was discussed  - Patient is < 65 on date of encounter and does not qualify for this measure      134 Preventive Care & Screening for Depression & F/U Plan  % pts  > 32 yo using depression screening tool (PRIME MD-PHQ-2) and if positive, a follow up plan is documented on the date or 2 days after the date.  - Patient has been diagnosed with bipolar disorder. Not eligible for this measure. [M8413]   178 Rheumatoid Arthritis: Functional Status Assessment  % of patients aged  > 75 yo with a dx of RA for whom a functional status assessment was performed at least once within 12 months.  - Patient does NOT have a diagnosis of Rheumatoid arthritis and does not qualify for this measure.   226 Tobacco Use: Screening & Cessation Intervention  Patient use any type of tobacco?  - Patient screened for tobacco use & identified as a tobacco NON-USER. [Performed met (G9903)]   318 Falls Risk  % of patients  > 26 yo who were screened for future fall risk during the measurement period  - Patient is < 72 yo on date of encounter and does NOT qualify for this measure    155 Falls:   Plan of Care % of patients  > 47 yo with 2 or more falls per yr with a falls plan of care documented  - Patient is < 33 years old on date of encounter and does not qualify for this measure  317 Preventive Care & Screening for High BP & Follow-Up Documented  (Date: 07/19/2022)  % of patients aged  > 48 yo who were screened for high blood pressure AND a recommended follow-up plan    - Patient has a pre-existing DIAGNOSIS of HYPERTENSION and is excluded from this measure (J8119)       NO SHOWS AND CANCELLATIONS: None on file  ----    ALLERGIES:   Allergies   Allergen Reactions    Adhesive Tape-Silicones Hives     Pt can only tolerate cloth tape    Ginger Anaphylaxis    Other Hives     ABD pad causes rash  Pt can only tolerate cloth tape  Pt can only tolerate cloth tape    Amoxicillin-Pot Clavulanate Headache    Opioids - Morphine Analogues       I become very angry    Iodine Nausea Only    Vancomycin Other (See Comments)     Burns her vein really bad and always causes them to blow.       MEDICATIONS:   Current Outpatient Medications on File Prior to Visit   Medication Sig Dispense Refill    albuterol HFA 90 mcg/actuation inhaler ProAir HFA 90 mcg/actuation aerosol inhaler      ARIPiprazole (ABILIFY) 2 MG tablet Take 1 tablet (2 mg total) by mouth.      azelastine (ASTELIN) 137 mcg (0.1 %) nasal spray PLACE 1 SPRAY INTO BOTH NOSTRILS 2 (TWO) TIMES DAILY. USE IN EACH NOSTRIL AS DIRECTED      Bifidobacterium infantis (ALIGN ORAL) Take by mouth.      bimekizumab-bkzx 160 mg/mL AtIn Inject the contents of 2 syringes (320 mg total)  every 8 weeks. 4 mL 2    bimekizumab-bkzx 160 mg/mL AtIn Inject the contents of 2 syringes (320 mg total)  under the skin every twenty-eight (28) days. 2 mL 4    busPIRone (BUSPAR) 10 MG tablet Take 1 tablet (10 mg total) by mouth.      cefdinir (OMNICEF) 300 MG capsule Take 1 capsule (300 mg total) by mouth two (2) times a day. 60 capsule 5 clindamycin (CLEOCIN) 300 MG capsule TAKE 1 CAPSULE BY MOUTH TWO TIMES A DAY. 63 capsule 11    diphenhydrAMINE (BENADRYL) 25 mg capsule/tablet Take 1 each (25 mg total) by mouth every six (6) hours as needed for itching.      doxycycline (VIBRA-TABS) 100 MG tablet TAKE 1 TABLET (100 MG TOTAL) BY MOUTH TWO (2) TIMES A DAY. FOR 7-10 DAYS AS NEEDED FOR HS FLARES 60 tablet 0    empty container Misc Use as directed to dispose of Cosentyx pens. 1 each 2    EPINEPHrine (EPIPEN) 0.3 mg/0.3 mL injection INJECTAS DIRECTED AS NEEDED FOR SYSTEMIC REACTIONS  1    ergocalciferol-1,250 mcg, 50,000 unit, (DRISDOL) 1,250 mcg (50,000 unit) capsule Take 1 capsule (1,250 mcg total) by mouth once a week.      erythromycin (ROMYCIN) 5 mg/gram (0.5 %) ophthalmic ointment       hydrOXYzine (ATARAX) 25 MG tablet Take 1 tablet (25 mg total) by mouth every eight (8) hours as needed for itching, allergies or anxiety. 20 tablet 0    infliximab (REMICADE IV) Remicade      iron,carbonyl-vitamin C 100-250 mg Tab Take 1 tablet by mouth daily.      lamoTRIgine (LAMICTAL) 100 MG tablet Take 0.5 tablets (50 mg total) by mouth two (2) times a day.      methylphenidate  HCl (RITALIN) 20 MG tablet Take 1 tablet (20 mg total) by mouth daily.      mometasone (ELOCON) 0.1 % solution Apply topically daily.      montelukast (SINGULAIR) 10 mg tablet TAKE 1 TABLET AT BEDTIME      norethindrone (MICRONOR) 0.35 mg tablet Take 1 tablet by mouth daily.      nystatin (MYCOSTATIN) 100,000 unit/gram ointment Apply topically two (2) times a day. To yeast rash 30 g 5    nystatin (MYCOSTATIN) 100,000 unit/gram powder Apply topically.      ofloxacin (FLOXIN) 0.3 % otic solution PLACE 10 DROPS INTO BOTH EARS DAILY.      olopatadine (PATANOL) 0.1 % ophthalmic solution       ondansetron (ZOFRAN-ODT) 4 MG disintegrating tablet Take 1 tablet (4 mg total) by mouth.      rizatriptan (MAXALT) 10 MG tablet PLEASE SEE ATTACHED FOR DETAILED DIRECTIONS      secukinumab (COSENTYX PEN, 2 PENS,) 150 mg/mL PnIj injection Inject the contents of 2 pens (300 mg total) once weekly at weeks 0, 1, 2, 3, and 4. As loading dose. 10 mL 0    secukinumab (COSENTYX PEN, 2 PENS,) 150 mg/mL PnIj injection Inject the contents of 2 pens (300 mg total) under the skin every twenty-eight (28) days. Maintenance dose. 2 mL 11    sulfamethoxazole-trimethoprim (BACTRIM DS) 800-160 mg per tablet TAKE 1 TABLET (160 MG OF TRIMETHROPRIM TOTAL) BY MOUTH TWICE A DAY 60 tablet 3    sumatriptan (IMITREX) 100 MG tablet Take 1 tablet (100 mg total) by mouth.      triamcinolone (KENALOG) 0.1 % ointment Apply topically two (2) times a day. to inflamed areas of HS twice a day until improved, no longer than 2 weeks at a time. Then stop for two weeks. Restart as needed. 454 g 5    venlafaxine (EFFEXOR) 37.5 MG tablet Take 1 tablet (37.5 mg total) by mouth two (2) times a day.       Current Facility-Administered Medications on File Prior to Visit   Medication Dose Route Frequency Provider Last Rate Last Admin    ertapenem Pincus Sanes) injection 1 g  1 g Intravenous Once Elsie Stain, MD        povidone-iodine 10 % swab 1 Application  1 application. Each Nare Once Harvin Hazel, MD            PAST MEDICAL HISTORY:  Past Medical History:   Diagnosis Date    Acne     ADHD     Allergic     Anxiety disorder 06/28/2021    Last Assessment & Plan:    Formatting of this note might be different from the original.   Currently followed by psychiatry does have hydroxyzine and BuSpar.  Continue following with psychiatry as recommended and take medications as prescribed    Attention deficit hyperactivity disorder (ADHD), predominantly hyperactive type 02/11/2012    Last Assessment & Plan:    Formatting of this note might be different from the original.   Patient currently maintained on methylphenidate 20 mg daily.  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    Bipolar 1 disorder (CMS-HCC)     Bipolar 2 disorder, major depressive episode (CMS-HCC) 08/04/2019    Last Assessment & Plan:    Formatting of this note might be different from the original.   Patient is followed  by Dr. Laurence Spates currently maintained on Lamictal and Abilify    Depression     Difficulty sleeping     Disorder of skin or subcutaneous tissue 2018    HS    Flu     had tamiflu    Headache, tension-type     Hidradenitis suppurativa 12/18/2017    History of opioid abuse (CMS-HCC)     Hydradenitis     Keloid     Right ear    Kidney stones     Polysubstance abuse (CMS-HCC)     Pulmonary embolism (CMS-HCC) 07/05/2021    Last Assessment & Plan:    Formatting of this note might be different from the original.   Patient has been evaluated by oncology and has completed Eliquis 5 mg twice daily for 6 months.  07/2021 [facility] hospital       REVIEW OF SYSTEMS:  GI: denies abdominal pain, bloating, constipation, diarrhea, heartburn, or nausea.  Musculoskeletal: Denies any weakness or numbness  Neurological: denies confusion, dizziness, light sensitivity, loss of consciousness  Psychiatric: anxiety depression  --Any additional pertinent positives and negatives are noted in HPI above.     PHYSICAL EXAM:  Vitals:        12/17/22 0914   BP: 119/71   Pulse: 98   Resp: 14   Weight: 88.2 kg (194 lb 6.4 oz)   Height: 165.1 cm (5' 5)   PainSc: 9    PainLoc: Generalized     Constitutional: Well-developed, well-nourished, in no acute distress.  Psychiatric: A&O x 3 with congruent mood & normal affect.   CV: Appears well perfused. No clubbing or cyanosis  Pulmonary: Respiratory effort adequate. Bilateral chest excursions. Speaking in full sentences.   Musculoskeletal:  All extremity movements appear symmetric and appropriate. Smooth, heel-toe, non-antalgic gait Not using any assistive devices for ambulation   Neurological: CN II-XII grossly intact. Recent & remote history are intact.   Integumentary: healing scar to chest wall     ASSESSMENT:     ICD-10-CM   1. Other chronic pain  G89.29   2. Hidradenitis suppurativa  L73.2   3. Neuropathic pain  M79.2   4. Pain associated with wound  T14.8XXA    R52   5. Polysubstance abuse (CMS-HCC)  F19.10       PLAN:   At this visit, the following medications were prescribed with appropriate education:  Requested Prescriptions     Signed Prescriptions Disp Refills    gabapentin (NEURONTIN) 600 MG tablet 90 tablet 5     Sig: Take 1 tablet (600 mg total) by mouth Three (3) times a day.    meloxicam (MOBIC) 7.5 MG tablet 60 tablet 5     Sig: Take 1 tablet (7.5 mg total) by mouth two (2) times a day as needed for pain. Take with food.       Additional orders given at today's visit:  Orders Placed This Encounter   Procedures    Amb Referral to Substance Use Disorder    Ambulatory referral to Psychiatry       - Patient seen for follow up of chronic pain associate with wounds from her HS.     - She is open and honest today about he hx of polysubstance abuse - has now been sober for about 2 months and requesting additional treatment for this. Advised that we do not manage suboxone for substance abuse here in this clinic but I will place a referral for substance abuse management to  [facility] (she is wanting to try and establish with new PCP)     - I will also try placing a referral to psychiatry at [facility] for possibly suboxone and ongoing psychiatry management as she is also needing a new psychiatry team     - Can coninute gabapentin 600mg  TID and meloxicam 7.5mg  bid prn for her pain associated with her wounds         -         I have personally reviewed and updated the pain description, ROS, Physical Exam, and plan today.  -         I wrote prescriptions for the medications listed above and informed the patient about potential side effects.  -         I personally reviewed [place] today        FOLLOW UP:  Return for 2 months with SB .    This note was generated in part with voice recognition software and I apologize for any typographical errors that were not detected and corrected.    Future Appointments   Date Time Provider Department Center   12/24/2022 10:00 AM UNCT[facility] 25 [facility]   01/03/2023  2:30 PM Reva Bores, PA [facility]   01/21/2023 11:30 AM UN[facility] 21 [facility]   02/05/2023  2:30 PM Reva Bores, PA [facility]   02/12/2023  1:30 PM Evelina Bucy, NP [facility]   02/18/2023  9:45 AM UNCT[facility] 14 [facility]       Raynelle Fanning, NP   Electronically signed 12/17/2022  11:15 AM

## 2022-12-24 ENCOUNTER — Ambulatory Visit: Admit: 2022-12-24 | Discharge: 2022-12-25 | Payer: PRIVATE HEALTH INSURANCE

## 2022-12-24 DIAGNOSIS — L732 Hidradenitis suppurativa: Secondary | ICD-10-CM | POA: Diagnosis not present

## 2022-12-24 LAB — CBC W/ AUTO DIFF
BASOPHILS ABSOLUTE COUNT: 0.1 10*9/L (ref 0.0–0.1)
BASOPHILS RELATIVE PERCENT: 0.9 %
EOSINOPHILS ABSOLUTE COUNT: 0.5 10*9/L (ref 0.0–0.5)
EOSINOPHILS RELATIVE PERCENT: 3.9 %
HEMATOCRIT: 38.6 % (ref 34.0–44.0)
HEMOGLOBIN: 12.8 g/dL (ref 11.3–14.9)
LYMPHOCYTES ABSOLUTE COUNT: 4.1 10*9/L — ABNORMAL HIGH (ref 1.1–3.6)
LYMPHOCYTES RELATIVE PERCENT: 33.6 %
MEAN CORPUSCULAR HEMOGLOBIN CONC: 33.1 g/dL (ref 32.0–36.0)
MEAN CORPUSCULAR HEMOGLOBIN: 29.5 pg (ref 25.9–32.4)
MEAN CORPUSCULAR VOLUME: 89 fL (ref 77.6–95.7)
MEAN PLATELET VOLUME: 7.9 fL (ref 6.8–10.7)
MONOCYTES ABSOLUTE COUNT: 1 10*9/L — ABNORMAL HIGH (ref 0.3–0.8)
MONOCYTES RELATIVE PERCENT: 8.1 %
NEUTROPHILS ABSOLUTE COUNT: 6.5 10*9/L (ref 1.8–7.8)
NEUTROPHILS RELATIVE PERCENT: 53.5 %
PLATELET COUNT: 326 10*9/L (ref 150–450)
RED BLOOD CELL COUNT: 4.34 10*12/L (ref 3.95–5.13)
RED CELL DISTRIBUTION WIDTH: 13.6 % (ref 12.2–15.2)
WBC ADJUSTED: 12.2 10*9/L — ABNORMAL HIGH (ref 3.6–11.2)

## 2022-12-24 LAB — C-REACTIVE PROTEIN: C-REACTIVE PROTEIN: 23 mg/L — ABNORMAL HIGH (ref <=10.0)

## 2022-12-24 LAB — AST: AST (SGOT): 15 U/L (ref <=34)

## 2022-12-24 LAB — SEDIMENTATION RATE: ERYTHROCYTE SEDIMENTATION RATE: 115 mm/h — ABNORMAL HIGH (ref 0–20)

## 2022-12-24 LAB — CREATININE
CREATININE: 0.84 mg/dL
EGFR CKD-EPI (2021) FEMALE: >90 mL/min/{1.73_m2} (ref >=60)

## 2022-12-24 LAB — ALT: ALT (SGPT): 14 U/L (ref 10–49)

## 2022-12-24 LAB — BUN: BLOOD UREA NITROGEN: 9 mg/dL (ref 9–23)

## 2022-12-24 MED ADMIN — inFLIXimab-axxq (AVSOLA) 10 mg/kg = 900 mg in sodium chloride (NS) 250 mL IVPB: 10 mg/kg | INTRAVENOUS | @ 15:00:00 | Stop: 2022-12-24

## 2022-12-24 MED ADMIN — acetaminophen (TYLENOL) tablet 650 mg: 650 mg | ORAL | @ 15:00:00 | Stop: 2022-12-24

## 2022-12-24 MED ADMIN — diphenhydrAMINE (BENADRYL) capsule/tablet 25 mg: 25 mg | ORAL | @ 15:00:00 | Stop: 2022-12-24

## 2022-12-24 MED ADMIN — cetirizine (ZYRTEC) tablet 10 mg: 10 mg | ORAL | @ 15:00:00 | Stop: 2022-12-24

## 2022-12-24 NOTE — Unmapped (Signed)
Patient presents for standard Avsola (infliximab-axxq) infusion.  In no acute distress. Vitals stable.  Reports no new medical issues or S/S of infection.  PIV placed.  See MAR for premeds.     1052 Avsola (infliximab-axxq) 900 mg infusing as follows:     61ml/hr x 15 min  51ml/hr x 15 min  22ml/hr x 15 min  58ml/hr x 15 min  129ml/hr x 30 min  255ml/hr for the remainder of the infusion.     1300 Avsola (infliximab-axxq) infusion complete.  PIV flushed with NS.  Vitals stable.  Patient without any s/s of adverse reaction.   IV d/c'd.  Patient discharged from Infusion Center.  Following visit scheduled

## 2022-12-26 ENCOUNTER — Emergency Department (HOSPITAL_BASED_OUTPATIENT_CLINIC_OR_DEPARTMENT_OTHER)
Admission: EM | Admit: 2022-12-26 | Discharge: 2022-12-26 | Disposition: A | Discharge status: Home / Self Care | Payer: BC Managed Care – PPO | Attending: Emergency Medicine | Admitting: Emergency Medicine

## 2022-12-26 ENCOUNTER — Other Ambulatory Visit: Payer: Self-pay

## 2022-12-26 ENCOUNTER — Encounter (HOSPITAL_BASED_OUTPATIENT_CLINIC_OR_DEPARTMENT_OTHER): Payer: Self-pay | Admitting: *Deleted

## 2022-12-26 DIAGNOSIS — J029 Acute pharyngitis, unspecified: Secondary | ICD-10-CM | POA: Diagnosis not present

## 2022-12-26 DIAGNOSIS — Z20822 Contact with and (suspected) exposure to covid-19: Secondary | ICD-10-CM | POA: Diagnosis not present

## 2022-12-26 DIAGNOSIS — L02213 Cutaneous abscess of chest wall: Secondary | ICD-10-CM | POA: Diagnosis not present

## 2022-12-26 DIAGNOSIS — L732 Hidradenitis suppurativa: Secondary | ICD-10-CM | POA: Diagnosis not present

## 2022-12-26 LAB — BASIC METABOLIC PANEL
Anion gap: 11 (ref 5–15)
BUN: 9 mg/dL (ref 6–20)
CO2: 22 mmol/L (ref 22–32)
Calcium: 9.3 mg/dL (ref 8.9–10.3)
Chloride: 104 mmol/L (ref 98–111)
Creatinine, Ser: 0.83 mg/dL (ref 0.44–1.00)
GFR, Estimated: >60 mL/min (ref >60)
Glucose, Bld: 114 mg/dL — ABNORMAL HIGH (ref 70–99)
Potassium: 3.4 mmol/L — ABNORMAL LOW (ref 3.5–5.1)
Sodium: 137 mmol/L (ref 135–145)

## 2022-12-26 LAB — CBC
HCT: 35.1 % — ABNORMAL LOW (ref 36.0–46.0)
Hemoglobin: 11.7 g/dL — ABNORMAL LOW (ref 12.0–15.0)
MCH: 29.8 pg (ref 26.0–34.0)
MCHC: 33.3 g/dL (ref 30.0–36.0)
MCV: 89.3 fL (ref 80.0–100.0)
Platelets: 320 10*3/uL (ref 150–400)
RBC: 3.93 MIL/uL (ref 3.87–5.11)
RDW: 13.4 % (ref 11.5–15.5)
WBC: 13.2 10*3/uL — ABNORMAL HIGH (ref 4.0–10.5)
nRBC: 0 % (ref 0.0–0.2)

## 2022-12-26 LAB — RESP PANEL BY RT-PCR (RSV, FLU A&B, COVID)  RVPGX2
Influenza A by PCR: NEGATIVE
Influenza B by PCR: NEGATIVE
Resp Syncytial Virus by PCR: NEGATIVE
SARS Coronavirus 2 by RT PCR: NEGATIVE

## 2022-12-26 LAB — GROUP A STREP BY PCR: Group A Strep by PCR: NOT DETECTED

## 2022-12-26 MED ORDER — LIDOCAINE-EPINEPHRINE (PF) 2 %-1:200000 IJ SOLN
20.0000 mL | Freq: Once | INTRAMUSCULAR | Status: AC
  Administered 2022-12-26: 20 mL
  Filled 2022-12-26: qty 20

## 2022-12-26 MED ORDER — DOXYCYCLINE HYCLATE 100 MG PO CAPS: 100.0000 mg | ORAL_CAPSULE | Freq: Two times a day (BID) | ORAL | 0 refills | Status: DC

## 2022-12-26 MED FILL — BIMZELX AUTOINJECTOR 160 MG/ML SUBCUTANEOUS AUTO-INJECTOR: 56 days supply | Qty: 2 | Fill #0

## 2022-12-26 NOTE — ED Provider Notes (Signed)
[place] EMERGENCY DEPARTMENT AT [facility] Provider Note   CSN: [id number] Arrival date & time: 12/26/22  0101     History  Chief Complaint  Patient presents with   Abscess    Courtney [name] is a 32 y.o. female.   Abscess    32 year old female with medical history significant for ADHD, hidradenitis, bipolar disorder, PE presenting to the emergency department with worsening swelling in the center of her chest.  She recently underwent I&D of the area in the emergency department.  She since had returning swelling over the past day.  Denies any fevers or surrounding redness.  Gets infusions monthly for her hidradenitis suppurativa.She gets cosentyx and remicade infusions. She has an upcoming dermatology appointment scheduled for Nov 14th.  Home Medications Prior to Admission medications   Medication Sig Start Date End Date Taking? Authorizing Provider  doxycycline (VIBRAMYCIN) 100 MG capsule Take 1 capsule (100 mg total) by mouth 2 (two) times daily. 12/26/22  Yes Ernie Avena, MD  albuterol (VENTOLIN HFA) 108 (90 Base) MCG/ACT inhaler TAKE 2 PUFFS BY MOUTH EVERY 6 HOURS AS NEEDED FOR WHEEZE OR SHORTNESS OF BREATH 07/05/22   Eden Emms, NP  ARIPiprazole (ABILIFY) 2 MG tablet Take by mouth.    [provider]  Azelastine HCl 137 MCG/SPRAY SOLN     [provider]  bifidobacterium infantis (ALIGN) capsule Take 1 capsule by mouth daily.    [provider]  BIMZELX 160 MG/ML pen Inject into the skin. 05/15/22   [provider]  busPIRone (BUSPAR) 10 MG tablet TAKE 1 TABLET BY MOUTH TWICE A DAY 02/06/22   Jomarie Longs, MD  diphenhydrAMINE (BENADRYL) 25 mg capsule 25 mg every 6 (six) hours as needed.    [provider]  EPINEPHrine (EPIPEN 2-PAK) 0.3 mg/0.3 mL IJ SOAJ injection Inject 0.3 mg into the muscle as needed for anaphylaxis. 11/01/22   Eden Emms, NP  EPINEPHrine 0.3 mg/0.3 mL IJ SOAJ injection Inject 0.3 mg into the  muscle as needed for anaphylaxis. 11/27/22   Charlynne Pander, MD  fluconazole (DIFLUCAN) 150 MG tablet Take 150 mg by mouth once. 05/15/22   [provider]  gabapentin (NEURONTIN) 600 MG tablet PLEASE SEE ATTACHED FOR DETAILED DIRECTIONS 10/16/22   [provider]  glucose blood (CONTOUR TEST) test strip And lancets #100 05/18/21   Romero Belling, MD  HYDROcodone-acetaminophen (HYCET) 7.5-325 mg/15 ml solution Take 5mL every six hours as needed for pain. 10/24/22   Mardella Layman, MD  hydrOXYzine (ATARAX) 25 MG tablet TAKE 1 TABLET BY MOUTH EVERY 8 HOURS AS NEEDED FOR ITCHING. 01/16/22   Worthy Rancher B, FNP  inFLIXimab (REMICADE IV) Inject 1 Dose into the vein every 30 (thirty) days.    [provider]  inFLIXimab (REMICADE) 100 MG injection     [provider]  Iron-Vitamin C 100-250 MG TABS Take 1 tablet by mouth daily.    [provider]  lamoTRIgine (LAMICTAL) 100 MG tablet TAKE 1/2 TABLET TWICE A DAY BY MOUTH 10/15/22   Jomarie Longs, MD  meloxicam (MOBIC) 7.5 MG tablet Take 7.5 mg by mouth 2 (two) times daily as needed. 09/20/22   [provider]  methylphenidate (RITALIN) 20 MG tablet Take 1 tablet (20 mg total) by mouth daily before breakfast. 11/20/22   Eden Emms, NP  Microlet Lancets MISC See admin instructions. 05/18/21   [provider]  montelukast (SINGULAIR) 10 MG tablet TAKE 1 TABLET BY MOUTH EVERYDAY AT  BEDTIME 05/16/22   Eden Emms, NP  norethindrone (MICRONOR) 0.35 MG tablet Take 1 tablet by mouth daily. 07/19/21   [provider]  nystatin (MYCOSTATIN/NYSTOP) powder SMARTSIG:1 Topical Daily    [provider]  nystatin ointment (MYCOSTATIN) Apply topically 2 (two) times daily. 10/11/22   [provider]  ofloxacin (FLOXIN) 0.3 % OTIC solution PLACE 10 DROPS INTO BOTH EARS DAILY.    [provider]  ondansetron (ZOFRAN) 4 MG tablet Take 1 tablet (4 mg total) by mouth every 8  (eight) hours as needed for nausea or vomiting. 11/01/22   Eden Emms, NP  oxyCODONE (ROXICODONE) 5 MG immediate release tablet Take 1 tablet (5 mg total) by mouth every 6 (six) hours as needed for severe pain. 10/25/22   White, Elita Boone, NP  predniSONE (DELTASONE) 20 MG tablet Take 2 tablets (40 mg total) by mouth daily. 10/25/22   Valinda Hoar, NP  rizatriptan (MAXALT) 10 MG tablet See Admin Instructions. PLEASE SEE ATTACHED FOR DETAILED DIRECTIONS    [provider]  SUMAtriptan (IMITREX) 100 MG tablet Take by mouth. 10/03/22   [provider]  tobramycin-dexamethasone (TOBRADEX) ophthalmic solution 1 drop 4 (four) times daily. 08/14/22   [provider]  traMADol (ULTRAM) 50 MG tablet Take by mouth. 08/08/21   [provider]  traZODone (DESYREL) 50 MG tablet TAKE 1 TABLET BY MOUTH EVERYDAY AT BEDTIME 11/10/21   Gweneth Dimitri, MD  triamcinolone ointment (KENALOG) 0.1 % PLEASE SEE ATTACHED FOR DETAILED DIRECTIONS    [provider]  venlafaxine (EFFEXOR) 75 MG tablet INCREASE EFFEXOR TO 37.5 MG IN THE MORNING AND 75 MG AT NIGHT FOR 1 WEEK, THEN INCREASE TO 75 MG TWICE DAILY FOR HEADACHES 10/29/22   [provider]  Vitamin D, Ergocalciferol, (DRISDOL) 1.25 MG (50000 UNIT) CAPS capsule Take 1 capsule (50,000 Units total) by mouth every 7 (seven) days. 12/12/22   Eden Emms, NP      Allergies    Other, Shellfish allergy, Tapentadol, Tape, Ginger, Morphine and codeine, and Vancomycin    Review of Systems   Review of Systems  All other systems reviewed and are negative.   Physical Exam Updated Vital Signs BP 109/68   Pulse 80   Temp 98.7 F (37.1 C) (Oral)   Resp 20   Ht 5\' 1"  (1.549 m)   Wt 88.5 kg   LMP 12/06/2022 (Approximate)   SpO2 100%   BMI 36.84 kg/m  Physical Exam Vitals and nursing note reviewed.  Constitutional:      General: She is not in acute distress. HENT:     Head: Normocephalic and atraumatic.  Eyes:      Conjunctiva/sclera: Conjunctivae normal.     Pupils: Pupils are equal, round, and reactive to light.  Cardiovascular:     Rate and Rhythm: Normal rate and regular rhythm.  Pulmonary:     Effort: Pulmonary effort is normal. No respiratory distress.  Chest:       Comments: Large area of fluctuance along the sternum, mildly tender, no surrounding erythema Abdominal:     General: There is no distension.     Tenderness: There is no guarding.  Musculoskeletal:        General: No deformity or signs of injury.     Cervical back: Neck supple.  Skin:    Findings: No lesion or rash.  Neurological:     General: No focal deficit present.     Mental Status: She is alert.  Mental status is at baseline.     ED Results / Procedures / Treatments   Labs (all labs ordered are listed, but only abnormal results are displayed) Labs Reviewed  CBC - Abnormal; Notable for the following components:      Result Value   WBC 13.2 (*)    Hemoglobin 11.7 (*)    HCT 35.1 (*)    All other components within normal limits  BASIC METABOLIC PANEL - Abnormal; Notable for the following components:   Potassium 3.4 (*)    Glucose, Bld 114 (*)    All other components within normal limits  GROUP A STREP BY PCR  RESP PANEL BY RT-PCR (RSV, FLU A&B, COVID)  [id number]    EKG None  Radiology No results found.  Procedures .Marland KitchenIncision and Drainage  Date/Time: 12/26/2022 6:48 AM  Performed by: Ernie Avena, MD Authorized by: Ernie Avena, MD   Consent:    Consent obtained:  Verbal   Consent given by:  Patient   Risks discussed:  Bleeding, incomplete drainage and pain Location:    Type:  Abscess   Size:  3-4cm   Location:  Trunk   Trunk location:  Chest Pre-procedure details:    Skin preparation:  Chlorhexidine Anesthesia:    Anesthesia method:  Local infiltration   Local anesthetic:  Lidocaine 1% WITH epi Procedure type:    Complexity:  Complex Procedure details:    Incision types:  Stab  incision   Drainage:  Purulent   Drainage amount:  Copious   Wound treatment:  Wound left open Post-procedure details:    Procedure completion:  Tolerated     Medications Ordered in ED Medications  lidocaine-EPINEPHrine (XYLOCAINE W/EPI) 2 %-1:200000 (PF) injection 20 mL (20 mLs Infiltration Given by Other 12/26/22 8295)    ED Course/ Medical Decision Making/ A&P                                 Medical Decision Making Amount and/or Complexity of Data Reviewed Labs: ordered.  Risk Prescription drug management.    32 year old female with medical history significant for ADHD, hidradenitis, bipolar disorder, PE presenting to the emergency department with worsening swelling in the center of her chest.  She recently underwent I&D of the area in the emergency department.  She since had returning swelling over the past day.  Denies any fevers or surrounding redness.  Gets infusions monthly for her hidradenitis suppurativa.She gets cosentyx and remicade infusions. She has an upcoming dermatology appointment scheduled for Nov 14th.  On arrival, the patient was vitally stable, afebrile, initially tachycardic but improved to heart rate of 90 without intervention, not tachypneic, hemodynamically stable, saturating well on room air.  Physical exam revealed a large fluctuant area along the patient's anterior chest wall consistent with hidradenitis suppurativa.  Given the significant tenderness and pain associated with the patient's swelling, we will go ahead with I&D.  No significant surrounding erythema to suggest cellulitis.  Patient is already undergoing management for this outpatient.  I&D was performed with instant relief of the patient's discomfort and expression of a large amount of purulence.  A dressing was placed.  Given the known hidradenitis suppurativa, will trial another course of doxycycline, have the patient follow-up outpatient with dermatology, return precautions  provided.  Additionally complained of a sore throat as well as some pain along the bilateral aspects of her tongue.  Will obtain COVID-19, influenza, strep PCR testing. Strep PCR  testing negative. Covid and Flu negative. Pt stable for DC and outpatient follow-up.   Final Clinical Impression(s) / ED Diagnoses Final diagnoses:  Hidradenitis suppurativa  Sore throat    Rx / DC Orders ED Discharge Orders          Ordered    doxycycline (VIBRAMYCIN) 100 MG capsule  2 times daily        12/26/22 2440              Ernie Avena, MD 12/26/22 [phone]

## 2022-12-26 NOTE — ED Notes (Signed)
Patient reports abscess to center chest. Redness and raised area noted to center of chest. Patient also c/o cough x3 days.

## 2022-12-26 NOTE — Discharge Instructions (Addendum)
Follow-up with your dermatologist regarding your hidradenitis suppurativa.  I have prescribed a short course of doxycycline.  Your strep PCR testing was negative.  Your COVID and influenza testing will be available on the patient portal.

## 2022-12-26 NOTE — ED Notes (Signed)
Patient's wound to center chest dressed with sterile 4x4 and skin tape.

## 2022-12-26 NOTE — ED Notes (Signed)
Provider at bedside performing I &D

## 2022-12-26 NOTE — ED Triage Notes (Signed)
Pt reporting abscess in the center of her chest, hx of the same. Finished her abx about a week ago after I&D in the ED. Abscess returning about 1 day ago. Denies fevers. Also c/o tongue pain. Took Hydrocodone prior to arrival for pain.

## 2022-12-26 NOTE — Unmapped (Signed)
[facility] Refill Coordination Note    Melissa Pearson, [name]: 30-Oct-1990  Phone: [phone] (home) [phone] (work)      All above HIPAA information was verified with patient.         12/23/2022    11:21 PM   Specialty Rx Medication Refill Questionnaire   Which Medications would you like refilled and shipped? Bimzelx   Please list all current allergies: Morphine   Have you missed any doses in the last 30 days? No   Have you had any changes to your medication(s) since your last refill? No   How many days remaining of each medication do you have at home? 0   If receiving an injectable medication, next injection date is 12/28/2022   Have you experienced any side effects in the last 30 days? No   Please enter the full address (street address, city, state, zip code) where you would like your medication(s) to be delivered to. [address] , [place], [place] [address]   Please specify on which day you would like your medication(s) to arrive. Note: if you need your medication(s) within 3 days, please call the pharmacy to schedule your order at [phone]  12/27/2022   Has your insurance changed since your last refill? No   Would you like a pharmacist to call you to discuss your medication(s)? No   Do you require a signature for your package? (Note: if we are billing Medicare Part B or your order contains a controlled substance, we will require a signature) No         Completed refill call assessment today to schedule patient's medication shipment from the [facility] [phone]).  All relevant notes have been reviewed.       Confirmed patient received a Conservation officer, historic buildings and a Surveyor, mining with first shipment. The patient will receive a drug information handout for each medication shipped and additional FDA Medication Guides as required.         REFERRAL TO PHARMACIST     Referral to the pharmacist: Not needed      [facility] Shipping address confirmed in Epic.     Delivery Scheduled: Yes, Expected medication delivery date: 10/24.     Medication will be delivered via UPS to the prescription address in Epic WAM.    Melissa Pearson [facility]

## 2022-12-27 DIAGNOSIS — M654 Radial styloid tenosynovitis [de Quervain]: Secondary | ICD-10-CM | POA: Diagnosis not present

## 2022-12-27 DIAGNOSIS — M24444 Recurrent dislocation, right finger: Secondary | ICD-10-CM | POA: Diagnosis not present

## 2023-01-01 ENCOUNTER — Other Ambulatory Visit: Payer: Self-pay | Admitting: Nurse Practitioner

## 2023-01-01 ENCOUNTER — Ambulatory Visit: Admit: 2023-01-01 | Discharge: 2023-01-02 | Payer: PRIVATE HEALTH INSURANCE

## 2023-01-01 DIAGNOSIS — F1190 Opioid use, unspecified, uncomplicated: Principal | ICD-10-CM

## 2023-01-01 DIAGNOSIS — F1091 Alcohol use, unspecified, in remission: Secondary | ICD-10-CM | POA: Diagnosis not present

## 2023-01-01 DIAGNOSIS — F1590 Other stimulant use, unspecified, uncomplicated: Principal | ICD-10-CM

## 2023-01-01 DIAGNOSIS — G8929 Other chronic pain: Principal | ICD-10-CM

## 2023-01-01 DIAGNOSIS — F1910 Other psychoactive substance abuse, uncomplicated: Principal | ICD-10-CM

## 2023-01-01 DIAGNOSIS — L732 Hidradenitis suppurativa: Principal | ICD-10-CM

## 2023-01-01 DIAGNOSIS — F901 Attention-deficit hyperactivity disorder, predominantly hyperactive type: Secondary | ICD-10-CM

## 2023-01-01 MED ORDER — BUPRENORPHINE 2 MG-NALOXONE 0.5 MG SUBLINGUAL TABLET
ORAL_TABLET | 0 refills | 0 days | Status: CP
Start: 2023-01-01 — End: ?

## 2023-01-01 MED ORDER — NALOXONE 4 MG/ACTUATION NASAL SPRAY
3 refills | 0 days | Status: CP
Start: 2023-01-01 — End: ?

## 2023-01-01 MED ORDER — TOPIRAMATE 25 MG TABLET
ORAL_TABLET | 1 refills | 0 days | Status: CP
Start: 2023-01-01 — End: ?

## 2023-01-01 NOTE — Telephone Encounter (Signed)
LAST APPOINTMENT DATE: 12/10/22   NEXT APPOINTMENT DATE: 03/13/2023 UDS 10/10/22   LAST REFILL: 11/20/22  QTY: #28 [phone]

## 2023-01-01 NOTE — Telephone Encounter (Signed)
Prescription Request  01/01/2023  LOV: 12/10/2022  What is the name of the medication or equipment? methylphenidate (RITALIN) 20 MG tablet   Have you contacted your pharmacy to request a refill? Yes   Which pharmacy would you like this sent to?  [id number] [place] [address], [place] - [address] [place] [address] Phone: [phone] Fax: [phone]     Patient notified that their request is being sent to the clinical staff for review and that they should receive a response within 2 business days.   Please advise at Mobile [phone] (mobile)

## 2023-01-01 NOTE — Unmapped (Signed)
[facility]  [address], [address]  [place], [address]  Phone: [phone]  Toll Free: [phone]  Fax: [phone]      [facility] Internal [facility] Initial Visit for Suboxone Maintenance Therapy    PCP: Dayton Bailiff, MD      ASSESSMENT    Severe Opioid Use Disorder  Severe stimulant use disorder  Chronic pain disorder from HS    PLAN  OUD and chronic pain from HS:  PRESCRIPTIONS GIVEN TODAY: Suboxone, dose 8mg  BID  - Was naloxone was prescribed today?  yes  - PDMP reviewed?yes  - Utox ordered and collected? No, patient not able to urinate and will complete in 2 weeks at follow up appointment  - Next appt (appointments are typically weekly or less during first 1-4 weeks of induction, then Q2 weeks during stabilization for 2-8 weeks, then can be Q1-2 months):2 weeks MUST BE SCHEDULED BEFORE PATIENT LEAVES  - Substance use Counseling: Other establishing with psych  - LAB recommendations (LFTs once or annually if liver disease, HIV, Hep B and C once entry to program and repeat if risk factors)  - Recommend Hep A and B vaccines if not immune    Stimulant use disorder:  Topamax 25mg  HS with increase to 25mg  BID prescribed for methamphetamine disorder. She had sezures with buproprion and has a contraindication to naltrexone since she's starting suboxone. Discussed mirtazapine but prefers Topamax due to possible mood stabilizing effects and worried about weight gain with mirtazapine.     Discussed risks and benefits of suboxone treatment for opioid dependence and benefits outweigh the risks.  Discussed the details of our program including:  No benzodiazepines or alcohol  Regular follow up, urine screens every in person visit or at least every other visit     - Suboxone treatment agreement:  [facility]) has been signed by patient and scanned into record      Other medical problems will be monitored by PCP: Dayton Bailiff, MD      Subjective    32 yo F with consistent methamphetamine and cocaine use, opioid pain pill use and prior heavy alcohol use presenting to establish care for SUD management.    Stimulants:  Started using cocaine and methamphetamine IN in 2019 when a friend introduced her. Never IVDU. Used up to 7g over 2 weeks, use fluctuates based on access and money. She uses to help numb emotions and stress esp from living with the chronic illness of HS and also living with and caring for her grandmother with dementia. Stopped using on her own a little over 2 months ago when a friend and hairdresser confronted her about her use who is also in recovery from opioids. Also reconnected with another friend in recovery and is motivated to abstain after talking with them. Stopped using on her own for 2 months, then returned to use about 2 weeks ago for 2 days, then stopped again. Last use approx 2 weeks ago.    Currently prescribed stimulants (ritalin) for ADHD.    Opioids:  Started using prescribed tramadol or oxycodone 5mg  around 2019 for pain associated with HS. Would supplement with getting oxycodone 5-10mg  from a friend prescribed chronic opioids. Used up to 40mg  oxycodone a day. Has tried fentanyl in the pat but didn't like the way it made her feel. Never IVDU. No overdose history but multiple friends had overdoses. Motivated to abstain and interested in suboxone. Never in treatment before outpatient or inpatient although tried to get  into a rehab before unsuccessfully. Stopped using on her own for 2 months, then returned to use about 2 weeks ago for 2 days, then stopped again. Last use approx 2 weeks ago.    +family relationships strained by her use. Has tried to cut down or stop with return to use. +severe withdrawal and tolerance + use despite psychological harms +possible contribution to losing job/not working currently    Alcohol:   Started drinking at age 61 and drank heavily until age 49 both liquor and beer. Unable to quantify but would drink many times until she couldn't remember driving home. Quit on her own without formal treatment.    Marijuana:  Last smoked 1 year ago.    Sedatives:  No benzodiazepine use.    Chronic pain: She attributes to HS and it is located around lesions in her axilla, chest, breast, groin, neck. 10/10 throughout most days (constant) and wakes her up at night, makes it harder to do things during the day although depends one day to the next. Throbbing and aching pain. Not improved with gabapentin in the past. Currently on meloxicam, tylenol and recently stopped gabapentin.        PSYCH HISTORY    Never hospitalized for psychiatric reasons before.   One suicide attempt in highschool by cutting her wrists.  Diagnosed with Bipolar disorder and ADHD currently followed by community psych and transferring care to [facility].      LEGAL HISTORY  No DWI    FAMILY HISTORY  Any history of substance abuse, mental illness, suicide attempt or abuse  Father with marijuana use, unclear if disordered use.     SOCIAL HISTORY  Social History     Socioeconomic History    Marital status: Single     Spouse name: None    Number of children: None    Years of education: None    Highest education level: None   Tobacco Use    Smoking status: Former     Types: e-Cigarettes    Smokeless tobacco: Never    Tobacco comments:     Hx of vape use   Vaping Use    Vaping status: Former   Substance and Sexual Activity    Alcohol use: Yes     Comment: 1 per month or less    Drug use: Never    Sexual activity: Not Currently     Partners: Male     Birth control/protection: Condom, Birth Control Pills   Other Topics Concern    Do you use sunscreen? Yes    Tanning bed use? Yes    Are you easily burned? Yes    Excessive sun exposure? Yes    Blistering sunburns? Yes     Social Determinants of Health     Financial Resource Strain: Low Risk  (02/11/2019)    Received from [facility]    Overall Financial Resource Strain (CARDIA)     Difficulty of Paying Living Expenses: Not hard at all   Food Insecurity: Low Risk  (07/05/2022)    [facility]    Hunger Vital Sign     Worried About Running Out of Food in the Last Year: Never true     Ran Out of Food in the Last Year: Never true    [facility] [employer] [employer]    Social Network     Lives with her mother and grandmother in [place]. 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 Boyfriend currently in prison. Main supports are 2 friends in recovery.        PMHx:   Past Medical History:   Diagnosis Date    Acne     ADHD     Allergic     Anxiety disorder 06/28/2021    Last Assessment & Plan:    Formatting of this note might be different from the original.   Currently followed by psychiatry does have hydroxyzine and BuSpar.  Continue following with psychiatry as recommended and take medications as prescribed    Attention deficit hyperactivity disorder (ADHD), predominantly hyperactive type 02/11/2012    Last Assessment & Plan:    Formatting of this note might be different from the original.   Patient currently maintained on methylphenidate 20 mg daily.  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    Bipolar 1 disorder (CMS-HCC)     Bipolar 2 disorder, major depressive episode (CMS-HCC) 08/04/2019    Last Assessment & Plan:    Formatting of this note might be different from the original.   Patient is followed by Dr. Laurence Spates currently maintained on Lamictal and Abilify    Depression     Difficulty sleeping     Disorder of skin or subcutaneous tissue 2018    HS    Flu     had tamiflu    Headache, tension-type     Hidradenitis suppurativa 12/18/2017    History of opioid abuse (CMS-HCC)     Hydradenitis     Keloid     Right ear    Kidney stones     Polysubstance abuse (CMS-HCC)     Pulmonary embolism (CMS-HCC) 07/05/2021    Last Assessment & Plan:    Formatting of this note might be different from the original.   Patient has been evaluated by oncology and has completed Eliquis 5 mg twice daily for 6 months.  07/2021 [facility] hospital       H/o Hep C or HIV?: no    Current medications:      Current Outpatient Medications:     albuterol HFA 90 mcg/actuation inhaler, ProAir HFA 90 mcg/actuation aerosol inhaler, Disp: , Rfl:     ARIPiprazole (ABILIFY) 2 MG tablet, Take 1 tablet (2 mg total) by mouth., Disp: , Rfl:     azelastine (ASTELIN) 137 mcg (0.1 %) nasal spray, PLACE 1 SPRAY INTO BOTH NOSTRILS 2 (TWO) TIMES DAILY. USE IN EACH NOSTRIL AS DIRECTED, Disp: , Rfl:     Bifidobacterium infantis (ALIGN ORAL), Take by mouth., Disp: , Rfl:     bimekizumab-bkzx 160 mg/mL AtIn, Inject the contents of 2 syringes (320 mg total)  every 8 weeks., Disp: 4 mL, Rfl: 2    bimekizumab-bkzx 160 mg/mL AtIn, Inject the contents of 2 syringes (320 mg total)  under the skin every twenty-eight (28) days., Disp: 2 mL, Rfl: 4    busPIRone (BUSPAR) 10 MG tablet, Take 1 tablet (10 mg total) by mouth., Disp: , Rfl:     cefdinir (OMNICEF) 300 MG capsule, Take 1 capsule (300 mg total) by mouth two (2) times a day., Disp: 60 capsule, Rfl: 5    clindamycin (CLEOCIN) 300 MG capsule, TAKE 1 CAPSULE BY MOUTH TWO TIMES A DAY., Disp: 63 capsule, Rfl: 11    diphenhydrAMINE (BENADRYL) 25 mg capsule/tablet, Take 1 each (25 mg total) by mouth every  six (6) hours as needed for itching., Disp: , Rfl:     doxycycline (VIBRA-TABS) 100 MG tablet, TAKE 1 TABLET (100 MG TOTAL) BY MOUTH TWO (2) TIMES A DAY. FOR 7-10 DAYS AS NEEDED FOR HS FLARES, Disp: 60 tablet, Rfl: 0    empty container Misc, Use as directed to dispose of Cosentyx pens., Disp: 1 each, Rfl: 2    EPINEPHrine (EPIPEN) 0.3 mg/0.3 mL injection, INJECTAS DIRECTED AS NEEDED FOR SYSTEMIC REACTIONS, Disp: , Rfl: 1    ergocalciferol-1,250 mcg, 50,000 unit, (DRISDOL) 1,250 mcg (50,000 unit) capsule, Take 1 capsule (1,250 mcg total) by mouth once a week., Disp: , Rfl:     erythromycin (ROMYCIN) 5 mg/gram (0.5 %) ophthalmic ointment, , Disp: , Rfl:     gabapentin (NEURONTIN) 600 MG tablet, Take 1 tablet (600 mg total) by mouth Three (3) times a day., Disp: 90 tablet, Rfl: 5    hydrOXYzine (ATARAX) 25 MG tablet, Take 1 tablet (25 mg total) by mouth every eight (8) hours as needed for itching, allergies or anxiety., Disp: 20 tablet, Rfl: 0    infliximab (REMICADE IV), Remicade, Disp: , Rfl:     iron,carbonyl-vitamin C 100-250 mg Tab, Take 1 tablet by mouth daily., Disp: , Rfl:     lamoTRIgine (LAMICTAL) 100 MG tablet, Take 0.5 tablets (50 mg total) by mouth two (2) times a day., Disp: , Rfl:     meloxicam (MOBIC) 7.5 MG tablet, Take 1 tablet (7.5 mg total) by mouth two (2) times a day as needed for pain. Take with food., Disp: 60 tablet, Rfl: 5    methylphenidate HCl (RITALIN) 20 MG tablet, Take 1 tablet (20 mg total) by mouth daily., Disp: , Rfl:     mometasone (ELOCON) 0.1 % solution, Apply topically daily., Disp: , Rfl:     montelukast (SINGULAIR) 10 mg tablet, TAKE 1 TABLET AT BEDTIME, Disp: , Rfl:     norethindrone (MICRONOR) 0.35 mg tablet, Take 1 tablet by mouth daily., Disp: , Rfl:     nystatin (MYCOSTATIN) 100,000 unit/gram ointment, Apply topically two (2) times a day. To yeast rash, Disp: 30 g, Rfl: 5    ofloxacin (FLOXIN) 0.3 % otic solution, PLACE 10 DROPS INTO BOTH EARS DAILY., Disp: , Rfl:     olopatadine (PATANOL) 0.1 % ophthalmic solution, , Disp: , Rfl:     ondansetron (ZOFRAN-ODT) 4 MG disintegrating tablet, Take 1 tablet (4 mg total) by mouth., Disp: , Rfl:     rizatriptan (MAXALT) 10 MG tablet, PLEASE SEE ATTACHED FOR DETAILED DIRECTIONS, Disp: , Rfl:     secukinumab (COSENTYX PEN, 2 PENS,) 150 mg/mL PnIj injection, Inject the contents of 2 pens (300 mg total) once weekly at weeks 0, 1, 2, 3, and 4. As loading dose., Disp: 10 mL, Rfl: 0    secukinumab (COSENTYX PEN, 2 PENS,) 150 mg/mL PnIj injection, Inject the contents of 2 pens (300 mg total) under the skin every twenty-eight (28) days. Maintenance dose., Disp: 2 mL, Rfl: 11    sulfamethoxazole-trimethoprim (BACTRIM DS) 800-160 mg per tablet, TAKE 1 TABLET (160 MG OF TRIMETHROPRIM TOTAL) BY MOUTH TWICE A DAY, Disp: 60 tablet, Rfl: 3    sumatriptan (IMITREX) 100 MG tablet, Take 1 tablet (100 mg total) by mouth., Disp: , Rfl:     triamcinolone (KENALOG) 0.1 % ointment, Apply topically two (2) times a day. to inflamed areas of HS twice a day until improved, no longer than 2 weeks at a time. Then stop for two  weeks. Restart as needed., Disp: 454 g, Rfl: 5    venlafaxine (EFFEXOR) 37.5 MG tablet, Take 1 tablet (37.5 mg total) by mouth two (2) times a day., Disp: , Rfl:     nystatin (MYCOSTATIN) 100,000 unit/gram powder, Apply topically., Disp: , Rfl:     Current Facility-Administered Medications:     ertapenem (INVanz) injection 1 g, 1 g, Intravenous, Once, Elsie Stain, MD    povidone-iodine 10 % swab 1 Application, 1 application., Each Nare, Once, Harvin Hazel, MD      Goals for Treatment:  1. Abstinence from methamphetamine and opioids  2. Chronic pain improvement in functionality            ______________________________________________________________________________________    I personally spent 75 minutes face-to-face and non-face-to-face in the care of this patient, which includes all pre, intra, and post visit time on the date of service.  All documented time was specific to the E/M visit and does not include any procedures that may have been performed.

## 2023-01-01 NOTE — Unmapped (Signed)
[facility] at [facility]     Type of visit: face to face    Are you located in [place]? (for virtual visits only) Yes    Reason for visit: Establish Care      Screening BP- 113/70 100        HCDM reviewed and updated in Epic:    We are working to make sure all of our patients??? wishes are updated in Epic and part of that is documenting a Environmental health practitioner for each patient  A Health Care Decision Maker is someone you choose who can make health care decisions for you if you are not able - who would you most want to do this for you????  is already up to date.    HCDM (patient stated preference): BERKLEY, ROHLIK - Mother - [phone]    HCDM, back-up (If primary HCDM is unavailable): Smiles,Marty - Father - [phone]    BPAs completed:        Immunization History   Administered Date(s) Administered    COVID-19 VAC,BIVALENT,MODERNA(BLUE CAP) 12/24/2020    COVID-19 VACC,MRNA,(PFIZER)(PF) 06/18/2019, 07/10/2019, 10/21/2019    DTaP 02/03/1993, 04/17/1993, 06/13/1993, 06/26/1994, 12/15/1996    HPV Quadrivalent (Gardasil) 12/22/2004, 01/03/2005, 01/03/2005, 01/30/2005, 01/30/2005, 04/30/2005, 04/30/2005    Hepatitis B Vaccine, Unspecified Formulation 07-18-1992, 02/03/1993, 06/13/1993    HiB-PRP-OMP 02/03/1993, 04/17/1993, 06/13/1993, 06/26/1994    INFLUENZA TIV (TRI) 41MO+ W/ PRESERV (IM) 12/27/2005, 12/25/2006, 11/11/2007, 12/13/2008    INFLUENZA TIV (TRI) PF (IM)(HISTORICAL) 12/15/2010    Influenza LAIV (Nasal-Tri) HISTORICAL 12/22/2004, 12/22/2004    Influenza Vaccine Quad(IM)6 MO-Adult(PF) 01/03/2016, 12/06/2016, 11/18/2017, 11/18/2018, 11/17/2019, 11/17/2020    Influenza Virus Vaccine, unspecified formulation 12/27/2005, 12/25/2006, 11/11/2007, 12/13/2008, 12/15/2010, 12/15/2010, 11/03/2012, 11/03/2012, 02/06/2014, 12/03/2017, 12/04/2018    MMR 12/13/1993, 06/23/1997    Meningococcal Conjugate MCV4P 11/11/2007, 11/11/2007    PPD Test 05/18/2019    Poliovirus,inactivated (IPV) 02/03/1993, 04/17/1993, 06/13/1993, 12/15/1996    TdaP 07/28/2004, 07/28/2004, 12/06/2016    Varicella 12/17/1995, 05/18/2019       __________________________________________________________________________________________    SCREENINGS COMPLETED IN FLOWSHEETS    HARK Screening       AUDIT       PHQ2       PHQ9          P4 Suicidality Screener                GAD7       COPD Assessment       Falls Risk

## 2023-01-02 ENCOUNTER — Encounter (HOSPITAL_COMMUNITY): Payer: Self-pay | Admitting: Family Medicine

## 2023-01-02 ENCOUNTER — Other Ambulatory Visit: Payer: Self-pay

## 2023-01-02 ENCOUNTER — Emergency Department (HOSPITAL_BASED_OUTPATIENT_CLINIC_OR_DEPARTMENT_OTHER): Payer: BC Managed Care – PPO

## 2023-01-02 ENCOUNTER — Observation Stay (HOSPITAL_BASED_OUTPATIENT_CLINIC_OR_DEPARTMENT_OTHER)
Admission: EM | Admit: 2023-01-02 | Discharge: 2023-01-03 | Disposition: A | Discharge status: Home / Self Care | Payer: BC Managed Care – PPO | Attending: Internal Medicine | Admitting: Internal Medicine

## 2023-01-02 DIAGNOSIS — J168 Pneumonia due to other specified infectious organisms: Secondary | ICD-10-CM | POA: Diagnosis not present

## 2023-01-02 DIAGNOSIS — L732 Hidradenitis suppurativa: Secondary | ICD-10-CM | POA: Diagnosis not present

## 2023-01-02 DIAGNOSIS — J189 Pneumonia, unspecified organism: Secondary | ICD-10-CM | POA: Diagnosis not present

## 2023-01-02 DIAGNOSIS — R109 Unspecified abdominal pain: Secondary | ICD-10-CM | POA: Diagnosis not present

## 2023-01-02 DIAGNOSIS — I959 Hypotension, unspecified: Secondary | ICD-10-CM | POA: Diagnosis not present

## 2023-01-02 DIAGNOSIS — N10 Acute tubulo-interstitial nephritis: Secondary | ICD-10-CM | POA: Diagnosis not present

## 2023-01-02 DIAGNOSIS — Z1152 Encounter for screening for COVID-19: Secondary | ICD-10-CM | POA: Diagnosis not present

## 2023-01-02 DIAGNOSIS — R590 Localized enlarged lymph nodes: Secondary | ICD-10-CM | POA: Diagnosis not present

## 2023-01-02 DIAGNOSIS — Z86711 Personal history of pulmonary embolism: Secondary | ICD-10-CM | POA: Diagnosis not present

## 2023-01-02 DIAGNOSIS — N200 Calculus of kidney: Secondary | ICD-10-CM | POA: Diagnosis not present

## 2023-01-02 DIAGNOSIS — E669 Obesity, unspecified: Secondary | ICD-10-CM | POA: Insufficient documentation

## 2023-01-02 DIAGNOSIS — F1910 Other psychoactive substance abuse, uncomplicated: Secondary | ICD-10-CM

## 2023-01-02 DIAGNOSIS — N201 Calculus of ureter: Secondary | ICD-10-CM | POA: Diagnosis not present

## 2023-01-02 DIAGNOSIS — Z6839 Body mass index (BMI) 39.0-39.9, adult: Secondary | ICD-10-CM | POA: Diagnosis not present

## 2023-01-02 DIAGNOSIS — R918 Other nonspecific abnormal finding of lung field: Secondary | ICD-10-CM | POA: Diagnosis not present

## 2023-01-02 DIAGNOSIS — R059 Cough, unspecified: Secondary | ICD-10-CM | POA: Diagnosis not present

## 2023-01-02 DIAGNOSIS — N12 Tubulo-interstitial nephritis, not specified as acute or chronic: Principal | ICD-10-CM | POA: Insufficient documentation

## 2023-01-02 DIAGNOSIS — N3000 Acute cystitis without hematuria: Secondary | ICD-10-CM | POA: Diagnosis not present

## 2023-01-02 LAB — CBC WITH DIFFERENTIAL/PLATELET
Abs Immature Granulocytes: 0.04 10*3/uL (ref 0.00–0.07)
Basophils Absolute: 0.1 10*3/uL (ref 0.0–0.1)
Basophils Relative: 1 %
Eosinophils Absolute: 0.4 10*3/uL (ref 0.0–0.5)
Eosinophils Relative: 4 %
HCT: 37.2 % (ref 36.0–46.0)
Hemoglobin: 12.6 g/dL (ref 12.0–15.0)
Immature Granulocytes: 0 %
Lymphocytes Relative: 37 %
Lymphs Abs: 4.2 10*3/uL — ABNORMAL HIGH (ref 0.7–4.0)
MCH: 30.1 pg (ref 26.0–34.0)
MCHC: 33.9 g/dL (ref 30.0–36.0)
MCV: 88.8 fL (ref 80.0–100.0)
Monocytes Absolute: 0.9 10*3/uL (ref 0.1–1.0)
Monocytes Relative: 8 %
Neutro Abs: 5.9 10*3/uL (ref 1.7–7.7)
Neutrophils Relative %: 50 %
Platelets: 349 10*3/uL (ref 150–400)
RBC Morphology: NONE SEEN
RBC: 4.19 MIL/uL (ref 3.87–5.11)
RDW: 13.2 % (ref 11.5–15.5)
WBC Morphology: ABNORMAL
WBC: 11.5 10*3/uL — ABNORMAL HIGH (ref 4.0–10.5)
nRBC: 0 % (ref 0.0–0.2)

## 2023-01-02 LAB — RESP PANEL BY RT-PCR (RSV, FLU A&B, COVID)  RVPGX2
Influenza A by PCR: NEGATIVE
Influenza B by PCR: NEGATIVE
Resp Syncytial Virus by PCR: NEGATIVE
SARS Coronavirus 2 by RT PCR: NEGATIVE

## 2023-01-02 LAB — URINALYSIS, W/ REFLEX TO CULTURE (INFECTION SUSPECTED)
Bilirubin Urine: NEGATIVE
Glucose, UA: NEGATIVE mg/dL
Hgb urine dipstick: NEGATIVE
Ketones, ur: NEGATIVE mg/dL
Nitrite: POSITIVE — AB
Specific Gravity, Urine: 1.024 (ref 1.005–1.030)
pH: 6 (ref 5.0–8.0)

## 2023-01-02 LAB — COMPREHENSIVE METABOLIC PANEL
ALT: 14 U/L (ref 0–44)
AST: 17 U/L (ref 15–41)
Albumin: 3.7 g/dL (ref 3.5–5.0)
Alkaline Phosphatase: 90 U/L (ref 38–126)
Anion gap: 6 (ref 5–15)
BUN: 12 mg/dL (ref 6–20)
CO2: 26 mmol/L (ref 22–32)
Calcium: 9.4 mg/dL (ref 8.9–10.3)
Chloride: 104 mmol/L (ref 98–111)
Creatinine, Ser: 0.72 mg/dL (ref 0.44–1.00)
GFR, Estimated: >60 mL/min (ref >60)
Glucose, Bld: 71 mg/dL (ref 70–99)
Potassium: 3.7 mmol/L (ref 3.5–5.1)
Sodium: 136 mmol/L (ref 135–145)
Total Bilirubin: 0.4 mg/dL (ref 0.3–1.2)
Total Protein: 8.8 g/dL — ABNORMAL HIGH (ref 6.5–8.1)

## 2023-01-02 LAB — PREGNANCY, URINE: Preg Test, Ur: NEGATIVE

## 2023-01-02 MED ORDER — TOPIRAMATE 25 MG PO TABS
25.0000 mg | ORAL_TABLET | Freq: Every day | ORAL | Status: DC
  Administered 2023-01-03: 25 mg via ORAL
  Filled 2023-01-02: qty 1

## 2023-01-02 MED ORDER — SODIUM CHLORIDE 0.9 % IV BOLUS
1000.0000 mL | Freq: Once | INTRAVENOUS | Status: AC
  Administered 2023-01-03: 1000 mL via INTRAVENOUS

## 2023-01-02 MED ORDER — SULFAMETHOXAZOLE-TRIMETHOPRIM 800-160 MG PO TABS: 1.0000 | ORAL_TABLET | Freq: Two times a day (BID) | ORAL | 0 refills | Status: AC

## 2023-01-02 MED ORDER — SODIUM CHLORIDE 0.9 % IV SOLN: 1.0000 g | INTRAVENOUS | Status: DC

## 2023-01-02 MED ORDER — NAPROXEN 250 MG PO TABS: 375.0000 mg | ORAL_TABLET | Freq: Four times a day (QID) | ORAL | Status: DC | PRN

## 2023-01-02 MED ORDER — ENOXAPARIN SODIUM 40 MG/0.4ML IJ SOSY
40.0000 mg | PREFILLED_SYRINGE | INTRAMUSCULAR | Status: DC
  Administered 2023-01-03: 40 mg via SUBCUTANEOUS
  Filled 2023-01-02: qty 0.4

## 2023-01-02 MED ORDER — OXYCODONE HCL 5 MG PO TABS: 5.0000 mg | ORAL_TABLET | ORAL | Status: DC | PRN

## 2023-01-02 MED ORDER — KETOROLAC TROMETHAMINE 15 MG/ML IJ SOLN
15.0000 mg | Freq: Once | INTRAMUSCULAR | Status: AC
  Administered 2023-01-02: 15 mg via INTRAVENOUS
  Filled 2023-01-02: qty 1

## 2023-01-02 MED ORDER — ONDANSETRON HCL 4 MG/2ML IJ SOLN
4.0000 mg | Freq: Once | INTRAMUSCULAR | Status: AC
  Administered 2023-01-02: 4 mg via INTRAVENOUS
  Filled 2023-01-02: qty 2

## 2023-01-02 MED ORDER — ACETAMINOPHEN 325 MG PO TABS: 650.0000 mg | ORAL_TABLET | Freq: Four times a day (QID) | ORAL | Status: DC | PRN

## 2023-01-02 MED ORDER — SODIUM CHLORIDE 0.9 % IV SOLN
1.0000 g | Freq: Once | INTRAVENOUS | Status: AC
  Administered 2023-01-02: 1 g via INTRAVENOUS
  Filled 2023-01-02: qty 10

## 2023-01-02 MED ORDER — SODIUM CHLORIDE 0.9 % IV SOLN: 500.0000 mg | INTRAVENOUS | Status: DC

## 2023-01-02 MED ORDER — SODIUM CHLORIDE 0.9 % IV SOLN
500.0000 mg | Freq: Once | INTRAVENOUS | Status: AC
  Administered 2023-01-02: 500 mg via INTRAVENOUS
  Filled 2023-01-02: qty 5

## 2023-01-02 MED ORDER — ONDANSETRON 4 MG PO TBDP: 4.0000 mg | ORAL_TABLET | Freq: Three times a day (TID) | ORAL | 0 refills | Status: DC | PRN

## 2023-01-02 MED ORDER — MORPHINE SULFATE (PF) 2 MG/ML IV SOLN: 1.0000 mg | INTRAVENOUS | Status: DC | PRN

## 2023-01-02 MED ORDER — ONDANSETRON HCL 4 MG/2ML IJ SOLN: 4.0000 mg | Freq: Four times a day (QID) | INTRAMUSCULAR | Status: DC | PRN

## 2023-01-02 NOTE — Assessment & Plan Note (Signed)
-  continue IV Rocephin pending culture

## 2023-01-02 NOTE — ED Provider Notes (Signed)
Care assumed from [facility], PA-C at shift change. Please see their note for further information.   Briefly: Patient with history of HS on steroids and remicade presents with right flank pain. No fevers or chills.   Ddx: pyelonephritis vs stone  Plan: WBC 11.5, consistent with her baseline. UA infectious, culture pending. Kidney function normal. CT renal pending, if negative can be discharged with po antibiotics. IV rocephin given.  7:00PM: Patients CT has resulted and reveals  1. 2 mm calculus in the mid left ureter without significant hydronephrosis. 2. New focal patchy airspace disease in the right lower lobe worrisome for pneumonia.  I have personally reviewed and interpreted this imaging and agree with radiology interpretation.  Discussed patient with urology on call Dr. Laverle Patter who recommends medicine admission for observation at [facility] given patient is immunocompromised and has a ureteral stone with a UTI.   Discussed plan with patient, she does note that she has been coughing significantly. Azithromycin added for pneumonia. DG chest ordered and RVP for same. Given she has 2 sources of infection and is immunocompromised, feel admission for observation is reasonable. Discussed same with patient who is understanding and in agreement with this.   Discussed patient with hospitalist Dr. Joneen Roach who accepts patient for admission.  Findings and plan of care discussed with supervising physician Dr. Theresia Lo who is in agreement.    Silva Bandy, PA-C 01/02/23 2035    Rexford Maus, DO 01/02/23 2137

## 2023-01-02 NOTE — Plan of Care (Signed)

## 2023-01-02 NOTE — H&P (Signed)
History and Physical    Patient: Courtney [name] ZOX:[id number] DOB: [birth year] DOA: 01/02/2023 DOS: the patient was seen and examined on 01/03/2023 PCP: Eden Emms, NP  [facility]: [facility]  Chief Complaint:  Chief Complaint  Patient presents with   Flank Pain   HPI: Courtney [name] is a 32 y.o. female with medical history significant of polysubstance abuse (methamphetamines, cocaine, hidradenitis supprativa on immunosuppressive, pulmonary embolism, bipolar disorder Type 2 who presents with right flank pain.   Reports right flank pain intermittently since June and has been getting work up outpatient. Patient goes from right flank and radiates to right lower abdomen. Today had associated vomiting. No dysuria but has increase urinary frequency. Has hx of kidney stones requiring stenting. Also has cough for the past several weeks.  She denies recent substance abuse and reports her IM physician at [facility] just prescribed her Suboxone and Topamax but she has not been able to get her script filled in [facility]. She does not want oxycodone or any oral opioids for her pain here because that is what she abused. She only wants IV opioids because then she cannot have access to the medication.   On arrival to the ED, she was afebrile with borderline hypotension BP of 93/70 on room air.  CBC with leukocytosis of 11.5, hemoglobin of 12.6.  CMP is unremarkable.  UA was positive for small leukocyte, positive nitrite and many bacteria.  CT abdomen and pelvis revealed a 2 mm left ureter stone with no hydronephrosis.  There was also findings of right lower lobe opacity on CT.  Urology Dr. Laverle Patter was consulted and recommended medicine admission given that she is immunocompromise and has a ureteral stone with UTI.  Patient was started on IV Rocephin and azithromycin for both her UTI and pneumonia.  Hospitalist then consulted for transfer here to [facility]   Review of Systems: As mentioned in  the history of present illness. All other systems reviewed and are negative. Past Medical History:  Diagnosis Date   ADHD (attention deficit hyperactivity disorder)    Bipolar disorder (HCC)    Chronic tonsillitis 08/2013   current strep, will finish antibiotic 09/04/2013; snores during sleep, mother denies apnea   Heart murmur    "when I was born; outgrew it; I was a preemie"   Hidradenitis    Hidradenitis 2017   Migraine 12/19/2016   "I've just had this one for 3 days" (12/20/2016)   Pulmonary emboli (HCC) 07/02/2021   BL   Septic shock (HCC) 07/02/2021   Past Surgical History:  Procedure Laterality Date   ADENOIDECTOMY N/A 10/15/2022   Procedure: ADENOIDECTOMY;  Surgeon: Serena Colonel, MD;  Location: [facility];  Service: ENT;  Laterality: N/A;   AXILLARY HIDRADENITIS EXCISION     AXILLARY LYMPH NODE BIOPSY Left 01/06/2019   CYSTOSCOPY W/ URETERAL STENT PLACEMENT Right 05/28/2017   Procedure: CYSTOSCOPY WITH RETROGRADE PYELOGRAM/ RIGHT URETERAL STENT PLACEMENT;  Surgeon: Crist Fat, MD;  Location: [facility];  Service: Urology;  Laterality: Right;   HYDRADENITIS EXCISION Bilateral 04/03/2017   Procedure: EXCISION AND DRAINAGE BILATERAL  HIDRADENITIS AXILLA;  Surgeon: Griselda Miner, MD;  Location[facility] OR;  Service: General;  Laterality: Bilateral;   INCISION AND DRAINAGE ABSCESS Right 07/20/2016   Procedure: INCISION AND DRAINAGE ABSCESS right axilla;  Surgeon: Abigail Miyamoto, MD;  Location: [facility] OR;  Service: General;  Laterality: Right;   INTRAUTERINE DEVICE (IUD) INSERTION  11/2016   "had the one in  my left arm removed"   IRRIGATION AND DEBRIDEMENT ABSCESS Right 12/21/2016   Procedure: IRRIGATION AND DEBRIDEMENT RIGHT AXILLARY HIDRADENITIS;  Surgeon: Andria Meuse, MD;  Location: [facility] OR;  Service: General;  Laterality: Right;   IRRIGATION AND DEBRIDEMENT ABSCESS Left 01/08/2017   Procedure: IRRIGATION AND DEBRIDEMENT AXILLARY ABSCESS;  Surgeon: Griselda Miner, MD;  Location: [facility] OR;  Service: General;  Laterality: Left;   NASAL SEPTOPLASTY W/ TURBINOPLASTY Bilateral 02/25/2019   Procedure: NASAL SEPTOPLASTY WITH TURBINATE REDUCTION;  Surgeon: Serena Colonel, MD;  Location: [facility];  Service: ENT;  Laterality: Bilateral;   TONSILLECTOMY Bilateral 09/14/2013   Procedure: BILATERAL TONSILLECTOMY;  Surgeon: Serena Colonel, MD;  Location: [facility];  Service: ENT;  Laterality: Bilateral;   TYMPANOPLASTY Bilateral    "rebuilt eardrums"   WISDOM TOOTH EXTRACTION     Social History:  reports that she has never smoked. She has never used smokeless tobacco. She reports that she does not currently use alcohol. She reports that she does not currently use drugs after having used the following drugs: Methamphetamines and Cocaine.  Allergies  Allergen Reactions   Other Hives    ABD pad causes rash Pt can only tolerate cloth tape Pt can only tolerate cloth tape   Shellfish Allergy Hives   Tapentadol Hives and Other (See Comments)    Pt can only tolerate cloth tape   Tape Hives and Other (See Comments)    Pt can only tolerate cloth tape   Ginger Hives   Morphine And Codeine     " I become very angry"   Vancomycin Other (See Comments)    Burns her vein really bad and always causes them to blow.    Family History  Problem Relation Age of Onset   Hypertension Mother    Hyperlipidemia Mother    Diabetes Mother    Hyperlipidemia Father    Heart attack Father 19   Alcohol abuse Paternal Uncle    Parkinson's disease Maternal Grandmother    Dementia Maternal Grandmother    Diabetes Maternal Grandmother    Bell's palsy Maternal Grandmother     Prior to Admission medications   Medication Sig Start Date End Date Taking? Authorizing Provider  ondansetron (ZOFRAN-ODT) 4 MG disintegrating tablet Take 1 tablet (4 mg total) by mouth every 8 (eight) hours as needed for nausea or vomiting. 01/02/23  Yes Clark, Meghan R, PA-C   sulfamethoxazole-trimethoprim (BACTRIM DS) 800-160 MG tablet Take 1 tablet by mouth 2 (two) times daily for 10 days. 01/02/23 01/12/23 Yes Clark, Meghan R, PA-C  albuterol (VENTOLIN HFA) 108 (90 Base) MCG/ACT inhaler TAKE 2 PUFFS BY MOUTH EVERY 6 HOURS AS NEEDED FOR WHEEZE OR SHORTNESS OF BREATH 07/05/22   Eden Emms, NP  ARIPiprazole (ABILIFY) 2 MG tablet Take by mouth.    [provider]  Azelastine HCl 137 MCG/SPRAY SOLN     [provider]  bifidobacterium infantis (ALIGN) capsule Take 1 capsule by mouth daily.    [provider]  BIMZELX 160 MG/ML pen Inject into the skin. 05/15/22   [provider]  busPIRone (BUSPAR) 10 MG tablet TAKE 1 TABLET BY MOUTH TWICE A DAY 02/06/22   Jomarie Longs, MD  diphenhydrAMINE (BENADRYL) 25 mg capsule 25 mg every 6 (six) hours as needed.    [provider]  EPINEPHrine (EPIPEN 2-PAK) 0.3 mg/0.3 mL IJ SOAJ injection Inject 0.3 mg into the muscle as needed for anaphylaxis. 11/01/22   Eden Emms,  NP  EPINEPHrine 0.3 mg/0.3 mL IJ SOAJ injection Inject 0.3 mg into the muscle as needed for anaphylaxis. 11/27/22   Charlynne Pander, MD  fluconazole (DIFLUCAN) 150 MG tablet Take 150 mg by mouth once. 05/15/22   [provider]  gabapentin (NEURONTIN) 600 MG tablet PLEASE SEE ATTACHED FOR DETAILED DIRECTIONS 10/16/22   [provider]  glucose blood (CONTOUR TEST) test strip And lancets #100 05/18/21   Romero Belling, MD  HYDROcodone-acetaminophen (HYCET) 7.5-325 mg/15 ml solution Take 5mL every six hours as needed for pain. 10/24/22   Mardella Layman, MD  hydrOXYzine (ATARAX) 25 MG tablet TAKE 1 TABLET BY MOUTH EVERY 8 HOURS AS NEEDED FOR ITCHING. 01/16/22   Worthy Rancher B, FNP  inFLIXimab (REMICADE IV) Inject 1 Dose into the vein every 30 (thirty) days.    [provider]  inFLIXimab (REMICADE) 100 MG injection     [provider]  Iron-Vitamin C 100-250 MG TABS Take 1 tablet by mouth  daily.    [provider]  lamoTRIgine (LAMICTAL) 100 MG tablet TAKE 1/2 TABLET TWICE A DAY BY MOUTH 10/15/22   Jomarie Longs, MD  meloxicam (MOBIC) 7.5 MG tablet Take 7.5 mg by mouth 2 (two) times daily as needed. 09/20/22   [provider]  methylphenidate (RITALIN) 20 MG tablet Take 1 tablet (20 mg total) by mouth daily before breakfast. 11/20/22   Eden Emms, NP  Microlet Lancets MISC See admin instructions. 05/18/21   [provider]  montelukast (SINGULAIR) 10 MG tablet TAKE 1 TABLET BY MOUTH EVERYDAY AT BEDTIME 05/16/22   Eden Emms, NP  norethindrone (MICRONOR) 0.35 MG tablet Take 1 tablet by mouth daily. 07/19/21   [provider]  nystatin (MYCOSTATIN/NYSTOP) powder SMARTSIG:1 Topical Daily    [provider]  nystatin ointment (MYCOSTATIN) Apply topically 2 (two) times daily. 10/11/22   [provider]  ofloxacin (FLOXIN) 0.3 % OTIC solution PLACE 10 DROPS INTO BOTH EARS DAILY.    [provider]  oxyCODONE (ROXICODONE) 5 MG immediate release tablet Take 1 tablet (5 mg total) by mouth every 6 (six) hours as needed for severe pain. 10/25/22   White, Elita Boone, NP  predniSONE (DELTASONE) 20 MG tablet Take 2 tablets (40 mg total) by mouth daily. 10/25/22   Valinda Hoar, NP  rizatriptan (MAXALT) 10 MG tablet See Admin Instructions. PLEASE SEE ATTACHED FOR DETAILED DIRECTIONS    [provider]  SUMAtriptan (IMITREX) 100 MG tablet Take by mouth. 10/03/22   [provider]  tobramycin-dexamethasone (TOBRADEX) ophthalmic solution 1 drop 4 (four) times daily. 08/14/22   [provider]  traMADol (ULTRAM) 50 MG tablet Take by mouth. 08/08/21   [provider]  traZODone (DESYREL) 50 MG tablet TAKE 1 TABLET BY MOUTH EVERYDAY AT BEDTIME 11/10/21   Gweneth Dimitri, MD  triamcinolone ointment (KENALOG) 0.1 % PLEASE SEE ATTACHED FOR DETAILED DIRECTIONS    [provider]  venlafaxine (EFFEXOR) 75  MG tablet INCREASE EFFEXOR TO 37.5 MG IN THE MORNING AND 75 MG AT NIGHT FOR 1 WEEK, THEN INCREASE TO 75 MG TWICE DAILY FOR HEADACHES 10/29/22   [provider]  Vitamin D, Ergocalciferol, (DRISDOL) 1.25 MG (50000 UNIT) CAPS capsule Take 1 capsule (50,000 Units total) by mouth every 7 (seven) days. 12/12/22   Eden Emms, NP    Physical Exam: Vitals:   01/02/23 1830 01/02/23 1900 01/02/23 2050 01/02/23 2145  BP: 95/72 103/69 92/64 99/72   Pulse: 85 72 88  76  Resp: 16 16 16 18   Temp:    98.4 F (36.9 C)  TempSrc:    Oral  SpO2: 100% 100% 100% 99%  Weight:      Height:       Constitutional: NAD, calm, restless obese female laying in bed  Eyes: lids and conjunctivae normal ENMT: Mucous membranes are moist.  Neck: normal, supple Respiratory: clear to auscultation bilaterally, no wheezing, no crackles. Normal respiratory effort. No accessory muscle use.  Cardiovascular: Regular rate and rhythm, no murmurs / rubs / gallops. No extremity edema. Abdomen: soft, mild tenderness to RUQ and right flank.  Musculoskeletal: no clubbing / cyanosis. No joint deformity upper and lower extremities. Normal muscle tone.  Skin: no rashes, lesions, ulcers. No induration Neurologic: CN 2-12 grossly intact.  Psychiatric: Normal judgment and insight. Alert and oriented x 3. Normal mood.   Data Reviewed:  See HPI  Assessment and Plan: * Acute cystitis -continue IV Rocephin pending culture  Left ureteral stone -non-obstructing 2mm stone to left ureter. No hydronephrosis. Strain all urine. -On IV antibiotics for UTI.  -PRN low dose IV morphine for pain. Pt declined any oral opioids stating that's what she was abusing in the past and did not want access to it now.   Polysubstance abuse (HCC) -hx of methamphetamines, cocaine -PCP recently prescribed Topamax and Suboxone but she has not yet started. Will start Topamax 25mg . Will test UDS before starting Suboxone. Primary had written for suboxone  film 2/0.5mg  BID  CAP (community acquired pneumonia) -Right lower lobe pneumonia with several weeks of coughing -continue IV Rocephin and azithromycin  Hypotension -will give a liter bolus and monitor  Hidradenitis suppurativa -On Remicade and Bimzelx injections.  -also takes doxycyline daily   Obesity (BMI 35.0-39.9 without comorbidity) BMI of 35      Advance Care Planning: Full  Consults: ED PA curbsided urology  Family Communication: none at bedside  Severity of Illness: The appropriate patient status for this patient is OBSERVATION. Observation status is judged to be reasonable and necessary in order to provide the required intensity of service to ensure the patient's safety. The patient's presenting symptoms, physical exam findings, and initial radiographic and laboratory data in the context of their medical condition is felt to place them at decreased risk for further clinical deterioration. Furthermore, it is anticipated that the patient will be medically stable for discharge from the hospital within 2 midnights of admission.   Author: Anselm Jungling, DO 01/03/2023 12:02 AM  For on call review [email or web address].

## 2023-01-02 NOTE — ED Notes (Signed)
Report given to [facility]. 

## 2023-01-02 NOTE — Discharge Instructions (Addendum)
Thank you for allowing [place] to be a part of your care today.  You were evaluated in the ED for right flank pain.  Your symptoms are likely secondary to pyelonephritis (UTI that has spread to your kidney).   I have sent in 10 days of an antibiotic for you to take.  Please take as prescribed and for the entire duration, even if your symptoms begin to improve.  Increase your fluid intake over the next several days also.  I have sent in a prescription for Zofran to help with any nausea you may experience.   Return to the ED if you develop fever of >100.55F, worsening symptoms, decreased urination, bloody urine, or if you have new concerns.    I recommend following up with your primary care provider next week to ensure symptoms are improving.  Your urine was also sent for culture.  If your antibiotic needs to be changed based on culture results, you will be notified.

## 2023-01-02 NOTE — ED Notes (Signed)
Report given to the floor RN.

## 2023-01-02 NOTE — H&P (Incomplete)
History and Physical    Patient: Courtney [name] YQM:[id number] DOB: Nov 15, 1990 DOA: 01/02/2023 DOS: the patient was seen and examined on 01/03/2023 PCP: Eden Emms, NP  [facility]: [facility]  Chief Complaint:  Chief Complaint  Patient presents with  . Flank Pain   HPI: Courtney [name] is a 32 y.o. female with medical history significant of polysubstance abuse (methamphetamines, cocaine, hidradenitis supprativa on immunosuppressive, pulmonary embolism, bipolar disorder Type 2 who presents with right flank pain.   Reports right flank pain intermittently since June and has been getting work up outpatient. Patient goes from right flank and radiates to right lower abdomen. Today had associated vomiting. No dysuria but has increase urinary frequency. Has hx of kidney stones requiring stenting. Also has cough for the past several weeks.  She denies recent substance abuse and reports her IM physician at [facility] just prescribed her Suboxone and Topamax but she has not been able to get her script filled in [facility]. She does not want oxycodone or any oral opioids for her pain here because that is what she abused. She only wants IV opioids because then she cannot have access to the medication.   On arrival to the ED, she was afebrile with borderline hypotension BP of 93/70 on room air.  CBC with leukocytosis of 11.5, hemoglobin of 12.6.  CMP is unremarkable.  UA was positive for small leukocyte, positive nitrite and many bacteria.  CT abdomen and pelvis revealed a 2 mm left ureter stone with no hydronephrosis.  There was also findings of right lower lobe opacity on CT.  Urology Dr. Laverle Patter was consulted and recommended medicine admission given that she is immunocompromise and has a ureteral stone with UTI.  Patient was started on IV Rocephin and azithromycin for both her UTI and pneumonia.  Hospitalist then consulted for transfer here to [facility]   Review of Systems: As mentioned  in the history of present illness. All other systems reviewed and are negative. Past Medical History:  Diagnosis Date  . ADHD (attention deficit hyperactivity disorder)   . Bipolar disorder (HCC)   . Chronic tonsillitis 08/2013   current strep, will finish antibiotic 09/04/2013; snores during sleep, mother denies apnea  . Heart murmur    "when I was born; outgrew it; I was a preemie"  . Hidradenitis   . Hidradenitis 2017  . Migraine 12/19/2016   "I've just had this one for 3 days" (12/20/2016)  . Pulmonary emboli (HCC) 07/02/2021   [facility]  . Septic shock (HCC) 07/02/2021   Past Surgical History:  Procedure Laterality Date  . ADENOIDECTOMY N/A 10/15/2022   Procedure: ADENOIDECTOMY;  Surgeon: Serena Colonel, MD;  Location: [facility];  Service: ENT;  Laterality: N/A;  . AXILLARY HIDRADENITIS EXCISION    . AXILLARY LYMPH NODE BIOPSY Left 01/06/2019  . CYSTOSCOPY W/ URETERAL STENT PLACEMENT Right 05/28/2017   Procedure: CYSTOSCOPY WITH RETROGRADE PYELOGRAM/ RIGHT URETERAL STENT PLACEMENT;  Surgeon: Crist Fat, MD;  Location: [facility];  Service: Urology;  Laterality: Right;  . HYDRADENITIS EXCISION Bilateral 04/03/2017   Procedure: EXCISION AND DRAINAGE BILATERAL  HIDRADENITIS AXILLA;  Surgeon: Griselda Miner, MD;  Location: [facility] OR;  Service: General;  Laterality: Bilateral;  . INCISION AND DRAINAGE ABSCESS Right 07/20/2016   Procedure: INCISION AND DRAINAGE ABSCESS right axilla;  Surgeon: Abigail Miyamoto, MD;  Location: [facility] OR;  Service: General;  Laterality: Right;  . INTRAUTERINE DEVICE (IUD) INSERTION  11/2016   "had the one in  my left arm removed"  . IRRIGATION AND DEBRIDEMENT ABSCESS Right 12/21/2016   Procedure: IRRIGATION AND DEBRIDEMENT RIGHT AXILLARY HIDRADENITIS;  Surgeon: Andria Meuse, MD;  Location: [facility] OR;  Service: General;  Laterality: Right;  . IRRIGATION AND DEBRIDEMENT ABSCESS Left 01/08/2017   Procedure: IRRIGATION AND DEBRIDEMENT AXILLARY ABSCESS;   Surgeon: Griselda Miner, MD;  Location: [facility] OR;  Service: General;  Laterality: Left;  . NASAL SEPTOPLASTY W/ TURBINOPLASTY Bilateral 02/25/2019   Procedure: NASAL SEPTOPLASTY WITH TURBINATE REDUCTION;  Surgeon: Serena Colonel, MD;  Location: [facility];  Service: ENT;  Laterality: Bilateral;  . TONSILLECTOMY Bilateral 09/14/2013   Procedure: BILATERAL TONSILLECTOMY;  Surgeon: Serena Colonel, MD;  Location: [facility];  Service: ENT;  Laterality: Bilateral;  . TYMPANOPLASTY Bilateral    "rebuilt eardrums"  . WISDOM TOOTH EXTRACTION     Social History:  reports that she has never smoked. She has never used smokeless tobacco. She reports that she does not currently use alcohol. She reports that she does not currently use drugs after having used the following drugs: Methamphetamines and Cocaine.  Allergies  Allergen Reactions  . Other Hives    ABD pad causes rash Pt can only tolerate cloth tape Pt can only tolerate cloth tape  . Shellfish Allergy Hives  . Tapentadol Hives and Other (See Comments)    Pt can only tolerate cloth tape  . Tape Hives and Other (See Comments)    Pt can only tolerate cloth tape  . Ginger Hives  . Morphine And Codeine     " I become very angry"  . Vancomycin Other (See Comments)    Burns her vein really bad and always causes them to blow.    Family History  Problem Relation Age of Onset  . Hypertension Mother   . Hyperlipidemia Mother   . Diabetes Mother   . Hyperlipidemia Father   . Heart attack Father 61  . Alcohol abuse Paternal Uncle   . Parkinson's disease Maternal Grandmother   . Dementia Maternal Grandmother   . Diabetes Maternal Grandmother   . Bell's palsy Maternal Grandmother     Prior to Admission medications   Medication Sig Start Date End Date Taking? Authorizing Provider  ondansetron (ZOFRAN-ODT) 4 MG disintegrating tablet Take 1 tablet (4 mg total) by mouth every 8 (eight) hours as needed for nausea or vomiting.  01/02/23  Yes Clark, Meghan R, PA-C  sulfamethoxazole-trimethoprim (BACTRIM DS) 800-160 MG tablet Take 1 tablet by mouth 2 (two) times daily for 10 days. 01/02/23 01/12/23 Yes Clark, Meghan R, PA-C  albuterol (VENTOLIN HFA) 108 (90 Base) MCG/ACT inhaler TAKE 2 PUFFS BY MOUTH EVERY 6 HOURS AS NEEDED FOR WHEEZE OR SHORTNESS OF BREATH 07/05/22   Eden Emms, NP  ARIPiprazole (ABILIFY) 2 MG tablet Take by mouth.    [provider]  Azelastine HCl 137 MCG/SPRAY SOLN     [provider]  bifidobacterium infantis (ALIGN) capsule Take 1 capsule by mouth daily.    [provider]  BIMZELX 160 MG/ML pen Inject into the skin. 05/15/22   [provider]  busPIRone (BUSPAR) 10 MG tablet TAKE 1 TABLET BY MOUTH TWICE A DAY 02/06/22   Jomarie Longs, MD  diphenhydrAMINE (BENADRYL) 25 mg capsule 25 mg every 6 (six) hours as needed.    [provider]  EPINEPHrine (EPIPEN 2-PAK) 0.3 mg/0.3 mL IJ SOAJ injection Inject 0.3 mg into the muscle as needed for anaphylaxis. 11/01/22   Eden Emms,  NP  EPINEPHrine 0.3 mg/0.3 mL IJ SOAJ injection Inject 0.3 mg into the muscle as needed for anaphylaxis. 11/27/22   Charlynne Pander, MD  fluconazole (DIFLUCAN) 150 MG tablet Take 150 mg by mouth once. 05/15/22   [provider]  gabapentin (NEURONTIN) 600 MG tablet PLEASE SEE ATTACHED FOR DETAILED DIRECTIONS 10/16/22   [provider]  glucose blood (CONTOUR TEST) test strip And lancets #100 05/18/21   Romero Belling, MD  HYDROcodone-acetaminophen (HYCET) 7.5-325 mg/15 ml solution Take 5mL every six hours as needed for pain. 10/24/22   Mardella Layman, MD  hydrOXYzine (ATARAX) 25 MG tablet TAKE 1 TABLET BY MOUTH EVERY 8 HOURS AS NEEDED FOR ITCHING. 01/16/22   Worthy Rancher B, FNP  inFLIXimab (REMICADE IV) Inject 1 Dose into the vein every 30 (thirty) days.    [provider]  inFLIXimab (REMICADE) 100 MG injection     [provider]  Iron-Vitamin C  100-250 MG TABS Take 1 tablet by mouth daily.    [provider]  lamoTRIgine (LAMICTAL) 100 MG tablet TAKE 1/2 TABLET TWICE A DAY BY MOUTH 10/15/22   Jomarie Longs, MD  meloxicam (MOBIC) 7.5 MG tablet Take 7.5 mg by mouth 2 (two) times daily as needed. 09/20/22   [provider]  methylphenidate (RITALIN) 20 MG tablet Take 1 tablet (20 mg total) by mouth daily before breakfast. 11/20/22   Eden Emms, NP  Microlet Lancets MISC See admin instructions. 05/18/21   [provider]  montelukast (SINGULAIR) 10 MG tablet TAKE 1 TABLET BY MOUTH EVERYDAY AT BEDTIME 05/16/22   Eden Emms, NP  norethindrone (MICRONOR) 0.35 MG tablet Take 1 tablet by mouth daily. 07/19/21   [provider]  nystatin (MYCOSTATIN/NYSTOP) powder SMARTSIG:1 Topical Daily    [provider]  nystatin ointment (MYCOSTATIN) Apply topically 2 (two) times daily. 10/11/22   [provider]  ofloxacin (FLOXIN) 0.3 % OTIC solution PLACE 10 DROPS INTO BOTH EARS DAILY.    [provider]  oxyCODONE (ROXICODONE) 5 MG immediate release tablet Take 1 tablet (5 mg total) by mouth every 6 (six) hours as needed for severe pain. 10/25/22   White, Elita Boone, NP  predniSONE (DELTASONE) 20 MG tablet Take 2 tablets (40 mg total) by mouth daily. 10/25/22   Valinda Hoar, NP  rizatriptan (MAXALT) 10 MG tablet See Admin Instructions. PLEASE SEE ATTACHED FOR DETAILED DIRECTIONS    [provider]  SUMAtriptan (IMITREX) 100 MG tablet Take by mouth. 10/03/22   [provider]  tobramycin-dexamethasone (TOBRADEX) ophthalmic solution 1 drop 4 (four) times daily. 08/14/22   [provider]  traMADol (ULTRAM) 50 MG tablet Take by mouth. 08/08/21   [provider]  traZODone (DESYREL) 50 MG tablet TAKE 1 TABLET BY MOUTH EVERYDAY AT BEDTIME 11/10/21   Gweneth Dimitri, MD  triamcinolone ointment (KENALOG) 0.1 % PLEASE SEE ATTACHED FOR DETAILED DIRECTIONS    [provider]  venlafaxine (EFFEXOR) 75 MG tablet INCREASE EFFEXOR TO 37.5 MG IN THE MORNING AND 75 MG AT NIGHT FOR 1 WEEK, THEN INCREASE TO 75 MG TWICE DAILY FOR HEADACHES 10/29/22   [provider]  Vitamin D, Ergocalciferol, (DRISDOL) 1.25 MG (50000 UNIT) CAPS capsule Take 1 capsule (50,000 Units total) by mouth every 7 (seven) days. 12/12/22   Eden Emms, NP    Physical Exam: Vitals:   01/02/23 1830 01/02/23 1900 01/02/23 2050 01/02/23 2145  BP: 95/72 103/69 92/64 99/72   Pulse: 85 72 88  76  Resp: 16 16 16 18   Temp:    98.4 F (36.9 C)  TempSrc:    Oral  SpO2: 100% 100% 100% 99%  Weight:      Height:       Constitutional: NAD, calm, restless obese female laying in bed  Eyes: lids and conjunctivae normal ENMT: Mucous membranes are moist.  Neck: normal, supple Respiratory: clear to auscultation bilaterally, no wheezing, no crackles. Normal respiratory effort. No accessory muscle use.  Cardiovascular: Regular rate and rhythm, no murmurs / rubs / gallops. No extremity edema. Abdomen: soft, mild tenderness to RUQ and right flank.  Musculoskeletal: no clubbing / cyanosis. No joint deformity upper and lower extremities. Normal muscle tone.  Skin: no rashes, lesions, ulcers. No induration Neurologic: CN 2-12 grossly intact.  Psychiatric: Normal judgment and insight. Alert and oriented x 3. Normal mood.   Data Reviewed: {Tip this will not be part of the note when signed- Document your independent interpretation of telemetry tracing, EKG, lab, Radiology test or any other diagnostic tests. Add any new diagnostic test ordered today. (Optional):[id number]} See HPI  Assessment and Plan: * Acute cystitis -continue IV Rocephin pending culture  Left ureteral stone -non-obstructing 2mm stone to left ureter. No hydronephrosis. Strain all urine. -On IV antibiotics for UTI.  -PRN low dose IV morphine for pain. Pt declined any oral opioids stating that's what she was abusing in the past  and did not want access to it now.   Polysubstance abuse (HCC) -hx of methamphetamines, cocaine -PCP recently prescribed Topamax and Suboxone but she has not yet started. Will start Topamax 25mg . Will test UDS before starting Suboxone. Primary had written for suboxone film 2/0.5mg  BID  CAP (community acquired pneumonia) -Right lower lobe pneumonia with several weeks of coughing -continue IV Rocephin and azithromycin  Hypotension -will give a liter bolus and monitor  Hidradenitis suppurativa -On Remicade and Bimzelx injections.  -also takes doxycyline daily   Obesity (BMI 35.0-39.9 without comorbidity) BMI of 35      Advance Care Planning: Full  Consults: ED PA curbsided urology  Family Communication: none at bedside  Severity of Illness: The appropriate patient status for this patient is OBSERVATION. Observation status is judged to be reasonable and necessary in order to provide the required intensity of service to ensure the patient's safety. The patient's presenting symptoms, physical exam findings, and initial radiographic and laboratory data in the context of their medical condition is felt to place them at decreased risk for further clinical deterioration. Furthermore, it is anticipated that the patient will be medically stable for discharge from the hospital within 2 midnights of admission.   Author: Anselm Jungling, DO 01/03/2023 12:02 AM  For on call review [email or web address].

## 2023-01-02 NOTE — ED Provider Notes (Signed)
EMERGENCY DEPARTMENT AT [facility] Provider Note   CSN: [id number] Arrival date & time: 01/02/23  1433     History  Chief Complaint  Patient presents with   Flank Pain    Courtney [name] is a 32 y.o. female with past medical history significant for ADHD, migraines, hidradenitis suppurativa, bipolar disorder, PE, polysubstance abuse presents to the ED complaining of right-sided flank pain that began a few hours ago.  She states she has had these symptoms in the past which resolved over the last couple of months until they returned today.  She endorses nausea and vomiting with her pain.  Denies urinary symptoms, diarrhea, stool changes, blood in her stool, fever, chills, abdominal pain.  She has not had any recent changes in medication, but reports she is supposed to start taking Topamax and Suboxone soon.       Home Medications Prior to Admission medications   Medication Sig Start Date End Date Taking? Authorizing Provider  ondansetron (ZOFRAN-ODT) 4 MG disintegrating tablet Take 1 tablet (4 mg total) by mouth every 8 (eight) hours as needed for nausea or vomiting. 01/02/23  Yes Adolph Clutter R, PA-C  sulfamethoxazole-trimethoprim (BACTRIM DS) 800-160 MG tablet Take 1 tablet by mouth 2 (two) times daily for 10 days. 01/02/23 01/12/23 Yes Eily Louvier R, PA-C  albuterol (VENTOLIN HFA) 108 (90 Base) MCG/ACT inhaler TAKE 2 PUFFS BY MOUTH EVERY 6 HOURS AS NEEDED FOR WHEEZE OR SHORTNESS OF BREATH 07/05/22   Eden Emms, NP  ARIPiprazole (ABILIFY) 2 MG tablet Take by mouth.    [provider]  Azelastine HCl 137 MCG/SPRAY SOLN     [provider]  bifidobacterium infantis (ALIGN) capsule Take 1 capsule by mouth daily.    [provider]  BIMZELX 160 MG/ML pen Inject into the skin. 05/15/22   [provider]  busPIRone (BUSPAR) 10 MG tablet TAKE 1 TABLET BY MOUTH TWICE A DAY 02/06/22   Jomarie Longs, MD  diphenhydrAMINE (BENADRYL) 25  mg capsule 25 mg every 6 (six) hours as needed.    [provider]  EPINEPHrine (EPIPEN 2-PAK) 0.3 mg/0.3 mL IJ SOAJ injection Inject 0.3 mg into the muscle as needed for anaphylaxis. 11/01/22   Eden Emms, NP  EPINEPHrine 0.3 mg/0.3 mL IJ SOAJ injection Inject 0.3 mg into the muscle as needed for anaphylaxis. 11/27/22   Charlynne Pander, MD  fluconazole (DIFLUCAN) 150 MG tablet Take 150 mg by mouth once. 05/15/22   [provider]  gabapentin (NEURONTIN) 600 MG tablet PLEASE SEE ATTACHED FOR DETAILED DIRECTIONS 10/16/22   [provider]  glucose blood (CONTOUR TEST) test strip And lancets #100 05/18/21   Romero Belling, MD  HYDROcodone-acetaminophen (HYCET) 7.5-325 mg/15 ml solution Take 5mL every six hours as needed for pain. 10/24/22   Mardella Layman, MD  hydrOXYzine (ATARAX) 25 MG tablet TAKE 1 TABLET BY MOUTH EVERY 8 HOURS AS NEEDED FOR ITCHING. 01/16/22   Worthy Rancher B, FNP  inFLIXimab (REMICADE IV) Inject 1 Dose into the vein every 30 (thirty) days.    [provider]  inFLIXimab (REMICADE) 100 MG injection     [provider]  Iron-Vitamin C 100-250 MG TABS Take 1 tablet by mouth daily.    [provider]  lamoTRIgine (LAMICTAL) 100 MG tablet TAKE 1/2 TABLET TWICE A DAY BY MOUTH 10/15/22   Jomarie Longs, MD  meloxicam (MOBIC) 7.5 MG tablet Take 7.5 mg by mouth 2 (two) times daily as needed. 09/20/22  [provider]  methylphenidate (RITALIN) 20 MG tablet Take 1 tablet (20 mg total) by mouth daily before breakfast. 11/20/22   Eden Emms, NP  Microlet Lancets MISC See admin instructions. 05/18/21   [provider]  montelukast (SINGULAIR) 10 MG tablet TAKE 1 TABLET BY MOUTH EVERYDAY AT BEDTIME 05/16/22   Eden Emms, NP  norethindrone (MICRONOR) 0.35 MG tablet Take 1 tablet by mouth daily. 07/19/21   [provider]  nystatin (MYCOSTATIN/NYSTOP) powder SMARTSIG:1 Topical Daily    [provider]   nystatin ointment (MYCOSTATIN) Apply topically 2 (two) times daily. 10/11/22   [provider]  ofloxacin (FLOXIN) 0.3 % OTIC solution PLACE 10 DROPS INTO BOTH EARS DAILY.    [provider]  oxyCODONE (ROXICODONE) 5 MG immediate release tablet Take 1 tablet (5 mg total) by mouth every 6 (six) hours as needed for severe pain. 10/25/22   White, Elita Boone, NP  predniSONE (DELTASONE) 20 MG tablet Take 2 tablets (40 mg total) by mouth daily. 10/25/22   Valinda Hoar, NP  rizatriptan (MAXALT) 10 MG tablet See Admin Instructions. PLEASE SEE ATTACHED FOR DETAILED DIRECTIONS    [provider]  SUMAtriptan (IMITREX) 100 MG tablet Take by mouth. 10/03/22   [provider]  tobramycin-dexamethasone (TOBRADEX) ophthalmic solution 1 drop 4 (four) times daily. 08/14/22   [provider]  traMADol (ULTRAM) 50 MG tablet Take by mouth. 08/08/21   [provider]  traZODone (DESYREL) 50 MG tablet TAKE 1 TABLET BY MOUTH EVERYDAY AT BEDTIME 11/10/21   Gweneth Dimitri, MD  triamcinolone ointment (KENALOG) 0.1 % PLEASE SEE ATTACHED FOR DETAILED DIRECTIONS    [provider]  venlafaxine (EFFEXOR) 75 MG tablet INCREASE EFFEXOR TO 37.5 MG IN THE MORNING AND 75 MG AT NIGHT FOR 1 WEEK, THEN INCREASE TO 75 MG TWICE DAILY FOR HEADACHES 10/29/22   [provider]  Vitamin D, Ergocalciferol, (DRISDOL) 1.25 MG (50000 UNIT) CAPS capsule Take 1 capsule (50,000 Units total) by mouth every 7 (seven) days. 12/12/22   Eden Emms, NP      Allergies    Other, Shellfish allergy, Tapentadol, Tape, Ginger, Morphine and codeine, and Vancomycin    Review of Systems   Review of Systems  Constitutional:  Negative for chills and fever.  Gastrointestinal:  Positive for nausea and vomiting. Negative for abdominal pain, blood in stool and diarrhea.  Genitourinary:  Positive for flank pain. Negative for dysuria, frequency and urgency.    Physical Exam Updated Vital  Signs BP 95/72 (BP Location: Right Arm)   Pulse 85   Temp 98 F (36.7 C) (Oral)   Resp 16   Ht 5\' 1"  (1.549 m)   Wt 86.2 kg   LMP 12/06/2022 (Approximate)   SpO2 100%   BMI 35.90 kg/m  Physical Exam Vitals and nursing note reviewed.  Constitutional:      General: She is not in acute distress.    Appearance: Normal appearance. She is not ill-appearing or diaphoretic.  Cardiovascular:     Rate and Rhythm: Normal rate and regular rhythm.  Pulmonary:     Effort: Pulmonary effort is normal. No tachypnea.  Abdominal:     General: Abdomen is flat.     Palpations: Abdomen is soft.     Tenderness: There is abdominal tenderness. There is right CVA tenderness. There is no guarding or rebound. Negative signs include McBurney's sign.    Skin:    General: Skin is warm and dry.  Capillary Refill: Capillary refill takes less than 2 seconds.  Neurological:     Mental Status: She is alert. Mental status is at baseline.  Psychiatric:        Mood and Affect: Mood normal.        Speech: Speech is rapid and pressured.        Behavior: Behavior normal.     ED Results / Procedures / Treatments   Labs (all labs ordered are listed, but only abnormal results are displayed) Labs Reviewed  URINALYSIS, W/ REFLEX TO CULTURE (INFECTION SUSPECTED) - Abnormal; Notable for the following components:      Result Value   APPearance HAZY (*)    Protein, ur TRACE (*)    Nitrite POSITIVE (*)    Leukocytes,Ua SMALL (*)    Bacteria, UA MANY (*)    All other components within normal limits  COMPREHENSIVE METABOLIC PANEL - Abnormal; Notable for the following components:   Total Protein 8.8 (*)    All other components within normal limits  CBC WITH DIFFERENTIAL/PLATELET - Abnormal; Notable for the following components:   WBC 11.5 (*)    Lymphs Abs 4.2 (*)    All other components within normal limits  URINE CULTURE  PREGNANCY, URINE    EKG None  Radiology No results  found.  Procedures Procedures    Medications Ordered in ED Medications  cefTRIAXone (ROCEPHIN) 1 g in sodium chloride 0.9 % 100 mL IVPB (has no administration in time range)  ondansetron (ZOFRAN) injection 4 mg (4 mg Intravenous Given 01/02/23 1604)  ketorolac (TORADOL) 15 MG/ML injection 15 mg (15 mg Intravenous Given 01/02/23 1632)    ED Course/ Medical Decision Making/ A&P                                 Medical Decision Making Amount and/or Complexity of Data Reviewed Labs: ordered. Radiology: ordered.  Risk Prescription drug management.   This patient presents to the ED with chief complaint(s) of R flank pain, nausea with vomiting with pertinent past medical history of polysubstance abuse, .  The complaint involves an extensive differential diagnosis and also carries with it a high risk of complications and morbidity.    The differential diagnosis includes nephrolithiasis, ureterolithiasis, UTI, pyelonephritis, cholecystitis, biliary colic  The initial plan is to obtain labs, UA, CT  Additional history obtained: Records reviewed Primary Care Documents  Initial Assessment:   Exam significant for ill-appearing patient who is not in acute distress.  Speech is rapid and pressured.  She is alert and oriented.  Skin is warm and dry.  Abdomen is soft with right CVA and right flank tenderness.  Negative McBurney's and Murphy sign.  Independent ECG/labs interpretation:  The following labs were independently interpreted:  UA positive for infection.  CBC with mild leukocytosis, no anemia.  Patient reports she has chronic leukocytosis due to autoimmune process.  Metabolic panel without electrolyte disturbance.  Renal function and hepatic function normal.  Urine sent for culture.  Treatment and Reassessment: Patient given IV Zofran and Toradol with significant improvement in symptoms.  She was able to tolerate eating crackers and drinking water without emesis.   Disposition:    Patient with evidence of pyelonephritis.  Patient does have autoimmune disease and uses mab injections for treatment.  Discussed with patient recommendation for hospital admission and IV antibiotics.  Patient does not wish to be admitted to the hospital at this time and  would like to trial PO antibiotics in the outpatient setting.  She states that she wants to get back home to her grandmother.  Advised patient that if her CT renal study returns with evidence of obstructive uropathy, she would require hospital admission.  After shared medical decision making conversation, I feel that a trial of outpatient antibiotics with strict return precautions is reasonable if CT is negative for kidney stone or complication of UTI.  Patient does not meet SIRS or sepsis criteria at this time.  Patient understands that she will need to return to hospital if she develops worsening symptoms, fever, tachycardia, or hypotension.  Bactrim and Zofran sent to pharmacy.  Patient will be notified if antibiotic will need to be altered after culture results return.    Will give patient 1 g IV Rocephin prior to discharge.  7:14 PM Care of patient transferred to Lurena Nida, PA-C at the end of my shift as the patient will require reassessment once labs/imaging have resulted. Patient presentation, ED course, and plan of care discussed with review of all pertinent labs and imaging. Please see his/her note for further details regarding further ED course and disposition. Plan at time of handoff is await results of CT renal. This may be altered or completely changed at the discretion of the oncoming team pending results of further workup.   Social Determinants of Health:   Patient's  polysubstance abuse  increases the complexity of managing their presentation         Final Clinical Impression(s) / ED Diagnoses Final diagnoses:  Pyelonephritis    Rx / DC Orders ED Discharge Orders          Ordered     sulfamethoxazole-trimethoprim (BACTRIM DS) 800-160 MG tablet  2 times daily        01/02/23 1914    ondansetron (ZOFRAN-ODT) 4 MG disintegrating tablet  Every 8 hours PRN        01/02/23 1914              Lenard Simmer, PA-C 01/02/23 1915    Rexford Maus, DO 01/02/23 2009

## 2023-01-02 NOTE — ED Notes (Signed)
Report attempted to the Floor RN.

## 2023-01-02 NOTE — ED Triage Notes (Signed)
Pt POV reporting R side flank pain, chills and one episode of yellow emesis that began a couple hours ago. Pt has had similar sx in the past with no dx.

## 2023-01-02 NOTE — Assessment & Plan Note (Signed)
-  On Remicade and Bimzelx injections.  -also takes doxycyline daily

## 2023-01-02 NOTE — Assessment & Plan Note (Signed)
BMI of 35 

## 2023-01-02 NOTE — Assessment & Plan Note (Signed)
-  hx of methamphetamines, cocaine -PCP recently prescribed Topamax and Suboxone but she has not yet started. Will start Topamax 25mg . Will test UDS before starting Suboxone. Primary had written for suboxone film 2/0.5mg  BID

## 2023-01-02 NOTE — Assessment & Plan Note (Signed)
-  will give a liter bolus and monitor

## 2023-01-02 NOTE — Assessment & Plan Note (Addendum)
-  non-obstructing 2mm stone to left ureter. No hydronephrosis. Strain all urine. -On IV antibiotics for UTI.  -PRN low dose IV morphine for pain. Pt declined any oral opioids stating that's what she was abusing in the past and did not want access to it now.

## 2023-01-02 NOTE — Assessment & Plan Note (Signed)
-  Right lower lobe pneumonia with several weeks of coughing -continue IV Rocephin and azithromycin

## 2023-01-02 NOTE — ED Notes (Signed)
Consulted with Pharm at [facility] about running both the Rocephin and Zithromax together... They stated they would wait and run them separately.Courtney KitchenMarland [name]

## 2023-01-03 ENCOUNTER — Encounter (HOSPITAL_COMMUNITY): Payer: Self-pay | Admitting: Family Medicine

## 2023-01-03 DIAGNOSIS — N390 Urinary tract infection, site not specified: Secondary | ICD-10-CM

## 2023-01-03 LAB — CBC
HCT: 35 % — ABNORMAL LOW (ref 36.0–46.0)
Hemoglobin: 11.2 g/dL — ABNORMAL LOW (ref 12.0–15.0)
MCH: 29.7 pg (ref 26.0–34.0)
MCHC: 32 g/dL (ref 30.0–36.0)
MCV: 92.8 fL (ref 80.0–100.0)
Platelets: 288 10*3/uL (ref 150–400)
RBC: 3.77 MIL/uL — ABNORMAL LOW (ref 3.87–5.11)
RDW: 13.4 % (ref 11.5–15.5)
WBC: 10.3 10*3/uL (ref 4.0–10.5)
nRBC: 0 % (ref 0.0–0.2)

## 2023-01-03 LAB — HIV ANTIBODY (ROUTINE TESTING W REFLEX): HIV Screen 4th Generation wRfx: NONREACTIVE

## 2023-01-03 MED ORDER — DOXYCYCLINE HYCLATE 100 MG PO TABS
100.0000 mg | ORAL_TABLET | Freq: Two times a day (BID) | ORAL | Status: DC
  Administered 2023-01-03: 100 mg via ORAL
  Filled 2023-01-03: qty 1

## 2023-01-03 MED ORDER — METHYLPHENIDATE HCL 20 MG PO TABS: 20.0000 mg | ORAL_TABLET | Freq: Every day | ORAL | 0 refills | Status: DC

## 2023-01-03 MED ORDER — SODIUM CHLORIDE 0.9 % IV SOLN
1.0000 g | Freq: Once | INTRAVENOUS | Status: AC
  Administered 2023-01-03: 1 g via INTRAVENOUS
  Filled 2023-01-03: qty 10

## 2023-01-03 MED ORDER — BUPRENORPHINE 8 MG-NALOXONE 2 MG SUBLINGUAL FILM
ORAL_FILM | Freq: Two times a day (BID) | SUBLINGUAL | 0 refills | 30 days | Status: CP
Start: 2023-01-03 — End: 2023-01-31

## 2023-01-03 NOTE — Consult Note (Signed)
Urology Consult   Physician requesting consult: Dr. Jerral Ralph  Reason for consult: Right flank pain and left ureteral calculus  History of Present Illness: Courtney [name] is a 32 y.o. with a history of urolithiasis previously treated by Dr. Marlou Porch who presented to the ED last night with right flank pain.  Her UA suggested infection. She has been afebrile but a CT stone study was performed and indicated no right ureteral obstruction but did show a 2 mm mid left ureteral calculus without hydronephrosis.  She has had no left flank pain.  No nausea or vomiting. She was started on antibiotic therapy and admitted for observation considering her immunocompromised status.   Past Medical History:  Diagnosis Date   ADHD (attention deficit hyperactivity disorder)    Bipolar disorder (HCC)    Chronic tonsillitis 08/2013   current strep, will finish antibiotic 09/04/2013; snores during sleep, mother denies apnea   Heart murmur    "when I was born; outgrew it; I was a preemie"   Hidradenitis    Hidradenitis 2017   Migraine 12/19/2016   "I've just had this one for 3 days" (12/20/2016)   Pulmonary emboli (HCC) 07/02/2021   BL   Septic shock (HCC) 07/02/2021    Past Surgical History:  Procedure Laterality Date   ADENOIDECTOMY N/A 10/15/2022   Procedure: ADENOIDECTOMY;  Surgeon: Serena Colonel, MD;  Location: [facility];  Service: ENT;  Laterality: N/A;   AXILLARY HIDRADENITIS EXCISION     AXILLARY LYMPH NODE BIOPSY Left 01/06/2019   CYSTOSCOPY W/ URETERAL STENT PLACEMENT Right 05/28/2017   Procedure: CYSTOSCOPY WITH RETROGRADE PYELOGRAM/ RIGHT URETERAL STENT PLACEMENT;  Surgeon: Crist Fat, MD;  Location: [facility];  Service: Urology;  Laterality: Right;   HYDRADENITIS EXCISION Bilateral 04/03/2017   Procedure: EXCISION AND DRAINAGE BILATERAL  HIDRADENITIS AXILLA;  Surgeon: Griselda Miner, MD;  Location: [facility] OR;  Service: General;  Laterality: Bilateral;   INCISION AND DRAINAGE ABSCESS  Right 07/20/2016   Procedure: INCISION AND DRAINAGE ABSCESS right axilla;  Surgeon: Abigail Miyamoto, MD;  Location: [facility];  Service: General;  Laterality: Right;   INTRAUTERINE DEVICE (IUD) INSERTION  11/2016   "had the one in my left arm removed"   IRRIGATION AND DEBRIDEMENT ABSCESS Right 12/21/2016   Procedure: IRRIGATION AND DEBRIDEMENT RIGHT AXILLARY HIDRADENITIS;  Surgeon: Andria Meuse, MD;  Location: [facility] OR;  Service: General;  Laterality: Right;   IRRIGATION AND DEBRIDEMENT ABSCESS Left 01/08/2017   Procedure: IRRIGATION AND DEBRIDEMENT AXILLARY ABSCESS;  Surgeon: Griselda Miner, MD;  Location: [facility] OR;  Service: General;  Laterality: Left;   NASAL SEPTOPLASTY W/ TURBINOPLASTY Bilateral 02/25/2019   Procedure: NASAL SEPTOPLASTY WITH TURBINATE REDUCTION;  Surgeon: Serena Colonel, MD;  Location: [facility];  Service: ENT;  Laterality: Bilateral;   TONSILLECTOMY Bilateral 09/14/2013   Procedure: BILATERAL TONSILLECTOMY;  Surgeon: Serena Colonel, MD;  Location: [facility];  Service: ENT;  Laterality: Bilateral;   TYMPANOPLASTY Bilateral    "rebuilt eardrums"   WISDOM TOOTH EXTRACTION       Current Hospital Medications:  Home meds:  No current facility-administered medications on file prior to encounter.   Current Outpatient Medications on File Prior to Encounter  Medication Sig Dispense Refill   albuterol (VENTOLIN HFA) 108 (90 Base) MCG/ACT inhaler TAKE 2 PUFFS BY MOUTH EVERY 6 HOURS AS NEEDED FOR WHEEZE OR SHORTNESS OF BREATH 8.5 each 1   ARIPiprazole (ABILIFY) 2 MG tablet Take by mouth.     Azelastine  HCl 137 MCG/SPRAY SOLN      bifidobacterium infantis (ALIGN) capsule Take 1 capsule by mouth daily.     BIMZELX 160 MG/ML pen Inject into the skin.     busPIRone (BUSPAR) 10 MG tablet TAKE 1 TABLET BY MOUTH TWICE A DAY 180 tablet 0   diphenhydrAMINE (BENADRYL) 25 mg capsule 25 mg every 6 (six) hours as needed.     EPINEPHrine (EPIPEN 2-PAK) 0.3 mg/0.3  mL IJ SOAJ injection Inject 0.3 mg into the muscle as needed for anaphylaxis. 1 each 0   EPINEPHrine 0.3 mg/0.3 mL IJ SOAJ injection Inject 0.3 mg into the muscle as needed for anaphylaxis. 2 each 0   fluconazole (DIFLUCAN) 150 MG tablet Take 150 mg by mouth once.     gabapentin (NEURONTIN) 600 MG tablet PLEASE SEE ATTACHED FOR DETAILED DIRECTIONS     glucose blood (CONTOUR TEST) test strip And lancets #100 100 each 12   HYDROcodone-acetaminophen (HYCET) 7.5-325 mg/15 ml solution Take 5mL every six hours as needed for pain. 30 mL 0   hydrOXYzine (ATARAX) 25 MG tablet TAKE 1 TABLET BY MOUTH EVERY 8 HOURS AS NEEDED FOR ITCHING. 270 tablet 1   inFLIXimab (REMICADE IV) Inject 1 Dose into the vein every 30 (thirty) days.     inFLIXimab (REMICADE) 100 MG injection      Iron-Vitamin C 100-250 MG TABS Take 1 tablet by mouth daily.     lamoTRIgine (LAMICTAL) 100 MG tablet TAKE 1/2 TABLET TWICE A DAY BY MOUTH 30 tablet 0   meloxicam (MOBIC) 7.5 MG tablet Take 7.5 mg by mouth 2 (two) times daily as needed.     methylphenidate (RITALIN) 20 MG tablet Take 1 tablet (20 mg total) by mouth daily before breakfast. 30 tablet 0   Microlet Lancets MISC See admin instructions.     montelukast (SINGULAIR) 10 MG tablet TAKE 1 TABLET BY MOUTH EVERYDAY AT BEDTIME 90 tablet 0   norethindrone (MICRONOR) 0.35 MG tablet Take 1 tablet by mouth daily.     nystatin (MYCOSTATIN/NYSTOP) powder SMARTSIG:1 Topical Daily     nystatin ointment (MYCOSTATIN) Apply topically 2 (two) times daily.     ofloxacin (FLOXIN) 0.3 % OTIC solution PLACE 10 DROPS INTO BOTH EARS DAILY.     oxyCODONE (ROXICODONE) 5 MG immediate release tablet Take 1 tablet (5 mg total) by mouth every 6 (six) hours as needed for severe pain. 15 tablet 0   predniSONE (DELTASONE) 20 MG tablet Take 2 tablets (40 mg total) by mouth daily. 10 tablet 0   rizatriptan (MAXALT) 10 MG tablet See Admin Instructions. PLEASE SEE ATTACHED FOR DETAILED DIRECTIONS     SUMAtriptan  (IMITREX) 100 MG tablet Take by mouth.     tobramycin-dexamethasone (TOBRADEX) ophthalmic solution 1 drop 4 (four) times daily.     traMADol (ULTRAM) 50 MG tablet Take by mouth.     traZODone (DESYREL) 50 MG tablet TAKE 1 TABLET BY MOUTH EVERYDAY AT BEDTIME 90 tablet 0   triamcinolone ointment (KENALOG) 0.1 % PLEASE SEE ATTACHED FOR DETAILED DIRECTIONS     venlafaxine (EFFEXOR) 75 MG tablet INCREASE EFFEXOR TO 37.5 MG IN THE MORNING AND 75 MG AT NIGHT FOR 1 WEEK, THEN INCREASE TO 75 MG TWICE DAILY FOR HEADACHES     Vitamin D, Ergocalciferol, (DRISDOL) 1.25 MG (50000 UNIT) CAPS capsule Take 1 capsule (50,000 Units total) by mouth every 7 (seven) days. 8 capsule 0     Scheduled Meds:  doxycycline  100 mg Oral Q12H   enoxaparin (  LOVENOX) injection  40 mg Subcutaneous Q24H   topiramate  25 mg Oral QHS   Continuous Infusions:  azithromycin     cefTRIAXone (ROCEPHIN)  IV     PRN Meds:.acetaminophen, morphine injection, naproxen, ondansetron (ZOFRAN) IV  Allergies:  Allergies  Allergen Reactions   Other Hives    ABD pad causes rash Pt can only tolerate cloth tape Pt can only tolerate cloth tape   Shellfish Allergy Hives   Tapentadol Hives and Other (See Comments)    Pt can only tolerate cloth tape   Tape Hives and Other (See Comments)    Pt can only tolerate cloth tape   Ginger Hives   Morphine And Codeine     " I become very angry"   Vancomycin Other (See Comments)    Burns her vein really bad and always causes them to blow.    Family History  Problem Relation Age of Onset   Hypertension Mother    Hyperlipidemia Mother    Diabetes Mother    Hyperlipidemia Father    Heart attack Father 65   Alcohol abuse Paternal Uncle    Parkinson's disease Maternal Grandmother    Dementia Maternal Grandmother    Diabetes Maternal Grandmother    Bell's palsy Maternal Grandmother     Social History:  reports that she has never smoked. She has never used smokeless tobacco. She reports  that she does not currently use alcohol. She reports that she does not currently use drugs after having used the following drugs: Methamphetamines and Cocaine.  ROS: A complete review of systems was performed.  All systems are negative except for pertinent findings as noted.  Physical Exam:  Vital signs in last 24 hours: Temp:  [97.9 F (36.6 C)-98.4 F (36.9 C)] 97.9 F (36.6 C) (10/31 0546) Pulse Rate:  [70-88] 70 (10/31 0546) Resp:  [15-18] 15 (10/31 0546) BP: (92-108)/(59-74) 108/65 (10/31 0546) SpO2:  [98 %-100 %] 98 % (10/31 0546) Weight:  [86.2 kg] 86.2 kg (10/30 1507) Constitutional:  Alert and oriented, No acute distress Cardiovascular: Regular rate and rhythm, No JVD Respiratory: Normal respiratory effort GI: Abdomen is soft, nontender, nondistended, no abdominal masses GU: Mild right CVAT, No left CVAT Lymphatic: No lymphadenopathy Neurologic: Grossly intact, no focal deficits Psychiatric: Normal mood and affect  Laboratory Data:  Recent Labs    01/02/23 1521 01/03/23 0328  WBC 11.5* 10.3  HGB 12.6 11.2*  HCT 37.2 35.0*  PLT 349 288    Recent Labs    01/02/23 1521  NA 136  K 3.7  CL 104  GLUCOSE 71  BUN 12  CALCIUM 9.4  CREATININE 0.72     Results for orders placed or performed during the hospital encounter of 01/02/23 (from the past 24 hour(s))  Comprehensive metabolic panel     Status: Abnormal   Collection Time: 01/02/23  3:21 PM  Result Value Ref Range   Sodium 136 135 - 145 mmol/L   Potassium 3.7 3.5 - 5.1 mmol/L   Chloride 104 98 - 111 mmol/L   CO2 26 22 - 32 mmol/L   Glucose, Bld 71 70 - 99 mg/dL   BUN 12 6 - 20 mg/dL   Creatinine, Ser 1.02 0.44 - 1.00 mg/dL   Calcium 9.4 8.9 - 58.5 mg/dL   Total Protein 8.8 (H) 6.5 - 8.1 g/dL   Albumin 3.7 3.5 - 5.0 g/dL   AST 17 15 - 41 U/L   ALT 14 0 - 44 U/L   Alkaline  Phosphatase 90 38 - 126 U/L   Total Bilirubin 0.4 0.3 - 1.2 mg/dL   GFR, Estimated >59 >56 mL/min   Anion gap 6 5 - 15  CBC  with Differential     Status: Abnormal   Collection Time: 01/02/23  3:21 PM  Result Value Ref Range   WBC 11.5 (H) 4.0 - 10.5 K/uL   RBC 4.19 3.87 - 5.11 MIL/uL   Hemoglobin 12.6 12.0 - 15.0 g/dL   HCT 38.7 56.4 - 33.2 %   MCV 88.8 80.0 - 100.0 fL   MCH 30.1 26.0 - 34.0 pg   MCHC 33.9 30.0 - 36.0 g/dL   RDW 95.1 88.4 - 16.6 %   Platelets 349 150 - 400 K/uL   nRBC 0.0 0.0 - 0.2 %   Neutrophils Relative % 50 %   Neutro Abs 5.9 1.7 - 7.7 K/uL   Lymphocytes Relative 37 %   Lymphs Abs 4.2 (H) 0.7 - 4.0 K/uL   Monocytes Relative 8 %   Monocytes Absolute 0.9 0.1 - 1.0 K/uL   Eosinophils Relative 4 %   Eosinophils Absolute 0.4 0.0 - 0.5 K/uL   Basophils Relative 1 %   Basophils Absolute 0.1 0.0 - 0.1 K/uL   WBC Morphology Abnormal lymphocytes present    RBC Morphology NO SCHISTOCYTES SEEN    Immature Granulocytes 0 %   Abs Immature Granulocytes 0.04 0.00 - 0.07 K/uL   Abnormal Lymphocytes Present PRESENT    Large Granular Lymphocytes PRESENT   Pregnancy, urine     Status: None   Collection Time: 01/02/23  3:36 PM  Result Value Ref Range   Preg Test, Ur NEGATIVE NEGATIVE  Urinalysis, w/ Reflex to Culture (Infection Suspected) -Urine, Clean Catch     Status: Abnormal   Collection Time: 01/02/23  3:36 PM  Result Value Ref Range   Specimen Source URINE, CLEAN CATCH    Color, Urine YELLOW YELLOW   APPearance HAZY (A) CLEAR   Specific Gravity, Urine 1.024 1.005 - 1.030   pH 6.0 5.0 - 8.0   Glucose, UA NEGATIVE NEGATIVE mg/dL   Hgb urine dipstick NEGATIVE NEGATIVE   Bilirubin Urine NEGATIVE NEGATIVE   Ketones, ur NEGATIVE NEGATIVE mg/dL   Protein, ur TRACE (A) NEGATIVE mg/dL   Nitrite POSITIVE (A) NEGATIVE   Leukocytes,Ua SMALL (A) NEGATIVE   RBC / HPF 0-5 0 - 5 RBC/hpf   WBC, UA 21-50 0 - 5 WBC/hpf   Bacteria, UA MANY (A) NONE SEEN   Squamous Epithelial / HPF 0-5 0 - 5 /HPF   Mucus PRESENT   Resp panel by RT-PCR (RSV, Flu A&B, Covid) Anterior Nasal Swab     Status: None    Collection Time: 01/02/23  8:04 PM   Specimen: Anterior Nasal Swab  Result Value Ref Range   SARS Coronavirus 2 by RT PCR NEGATIVE NEGATIVE   Influenza A by PCR NEGATIVE NEGATIVE   Influenza B by PCR NEGATIVE NEGATIVE   Resp Syncytial Virus by PCR NEGATIVE NEGATIVE  CBC     Status: Abnormal   Collection Time: 01/03/23  3:28 AM  Result Value Ref Range   WBC 10.3 4.0 - 10.5 K/uL   RBC 3.77 (L) 3.87 - 5.11 MIL/uL   Hemoglobin 11.2 (L) 12.0 - 15.0 g/dL   HCT 06.3 (L) 01.6 - 01.0 %   MCV 92.8 80.0 - 100.0 fL   MCH 29.7 26.0 - 34.0 pg   MCHC 32.0 30.0 - 36.0 g/dL   RDW 13.4  11.5 - 15.5 %   Platelets 288 150 - 400 K/uL   nRBC 0.0 0.0 - 0.2 %   Recent Results (from the past 240 hour(s))  Group A Strep by PCR     Status: None   Collection Time: 12/26/22  6:46 AM   Specimen: Throat; Sterile Swab  Result Value Ref Range Status   Group A Strep by PCR NOT DETECTED NOT DETECTED Final    Comment: Performed at [facility], [address], [place], [place] [address]  Resp panel by RT-PCR (RSV, Flu A&B, Covid) Anterior Nasal Swab     Status: None   Collection Time: 12/26/22  6:46 AM   Specimen: Anterior Nasal Swab  Result Value Ref Range Status   SARS Coronavirus 2 by RT PCR NEGATIVE NEGATIVE Final    Comment: (NOTE) SARS-CoV-2 target nucleic acids are NOT DETECTED.  The SARS-CoV-2 RNA is generally detectable in upper respiratory specimens during the acute phase of infection. The lowest concentration of SARS-CoV-2 viral copies this assay can detect is 138 copies/mL. A negative result does not preclude SARS-Cov-2 infection and should not be used as the sole basis for treatment or other patient management decisions. A negative result may occur with  improper specimen collection/handling, submission of specimen other than nasopharyngeal swab, presence of viral mutation(s) within the areas targeted by this assay, and inadequate number of viral copies(<138 copies/mL). A  negative result must be combined with clinical observations, patient history, and epidemiological information. The expected result is Negative.  Fact Sheet for Patients:  BloggerCourse.com  Fact Sheet for Healthcare Providers:  SeriousBroker.it  This test is no t yet approved or cleared by the [place] FDA and  has been authorized for detection and/or diagnosis of SARS-CoV-2 by FDA under an Emergency Use Authorization (EUA). This EUA will remain  in effect (meaning this test can be used) for the duration of the COVID-19 declaration under Section 564(b)(1) of the Act, 21 U.S.C.section 360bbb-3(b)(1), unless the authorization is terminated  or revoked sooner.       Influenza A by PCR NEGATIVE NEGATIVE Final   Influenza B by PCR NEGATIVE NEGATIVE Final    Comment: (NOTE) The Xpert Xpress SARS-CoV-2/FLU/RSV plus assay is intended as an aid in the diagnosis of influenza from Nasopharyngeal swab specimens and should not be used as a sole basis for treatment. Nasal washings and aspirates are unacceptable for Xpert Xpress SARS-CoV-2/FLU/RSV testing.  Fact Sheet for Patients: BloggerCourse.com  Fact Sheet for Healthcare Providers: SeriousBroker.it  This test is not yet approved or cleared by the [place] FDA and has been authorized for detection and/or diagnosis of SARS-CoV-2 by FDA under an Emergency Use Authorization (EUA). This EUA will remain in effect (meaning this test can be used) for the duration of the COVID-19 declaration under Section 564(b)(1) of the Act, 21 U.S.C. section 360bbb-3(b)(1), unless the authorization is terminated or revoked.     Resp Syncytial Virus by PCR NEGATIVE NEGATIVE Final    Comment: (NOTE) Fact Sheet for Patients: BloggerCourse.com  Fact Sheet for Healthcare  Providers: SeriousBroker.it  This test is not yet approved or cleared by the [place] FDA and has been authorized for detection and/or diagnosis of SARS-CoV-2 by FDA under an Emergency Use Authorization (EUA). This EUA will remain in effect (meaning this test can be used) for the duration of the COVID-19 declaration under Section 564(b)(1) of the Act, 21 U.S.C. section 360bbb-3(b)(1), unless the authorization is terminated or revoked.  Performed at [facility], [address] [place], [place] [address]   Resp panel by RT-PCR (RSV, Flu A&B, Covid) Anterior Nasal Swab     Status: None   Collection Time: 01/02/23  8:04 PM   Specimen: Anterior Nasal Swab  Result Value Ref Range Status   SARS Coronavirus 2 by RT PCR NEGATIVE NEGATIVE Final    Comment: (NOTE) SARS-CoV-2 target nucleic acids are NOT DETECTED.  The SARS-CoV-2 RNA is generally detectable in upper respiratory specimens during the acute phase of infection. The lowest concentration of SARS-CoV-2 viral copies this assay can detect is 138 copies/mL. A negative result does not preclude SARS-Cov-2 infection and should not be used as the sole basis for treatment or other patient management decisions. A negative result may occur with  improper specimen collection/handling, submission of specimen other than nasopharyngeal swab, presence of viral mutation(s) within the areas targeted by this assay, and inadequate number of viral copies(<138 copies/mL). A negative result must be combined with clinical observations, patient history, and epidemiological information. The expected result is Negative.  Fact Sheet for Patients:  BloggerCourse.com  Fact Sheet for Healthcare Providers:  SeriousBroker.it  This test is no t yet approved or cleared by the [place] FDA and  has been authorized for detection and/or diagnosis of  SARS-CoV-2 by FDA under an Emergency Use Authorization (EUA). This EUA will remain  in effect (meaning this test can be used) for the duration of the COVID-19 declaration under Section 564(b)(1) of the Act, 21 U.S.C.section 360bbb-3(b)(1), unless the authorization is terminated  or revoked sooner.       Influenza A by PCR NEGATIVE NEGATIVE Final   Influenza B by PCR NEGATIVE NEGATIVE Final    Comment: (NOTE) The Xpert Xpress SARS-CoV-2/FLU/RSV plus assay is intended as an aid in the diagnosis of influenza from Nasopharyngeal swab specimens and should not be used as a sole basis for treatment. Nasal washings and aspirates are unacceptable for Xpert Xpress SARS-CoV-2/FLU/RSV testing.  Fact Sheet for Patients: BloggerCourse.com  Fact Sheet for Healthcare Providers: SeriousBroker.it  This test is not yet approved or cleared by the [place] FDA and has been authorized for detection and/or diagnosis of SARS-CoV-2 by FDA under an Emergency Use Authorization (EUA). This EUA will remain in effect (meaning this test can be used) for the duration of the COVID-19 declaration under Section 564(b)(1) of the Act, 21 U.S.C. section 360bbb-3(b)(1), unless the authorization is terminated or revoked.     Resp Syncytial Virus by PCR NEGATIVE NEGATIVE Final    Comment: (NOTE) Fact Sheet for Patients: BloggerCourse.com  Fact Sheet for Healthcare Providers: SeriousBroker.it  This test is not yet approved or cleared by the [place] FDA and has been authorized for detection and/or diagnosis of SARS-CoV-2 by FDA under an Emergency Use Authorization (EUA). This EUA will remain in effect (meaning this test can be used) for the duration of the COVID-19 declaration under Section 564(b)(1) of the Act, 21 U.S.C. section 360bbb-3(b)(1), unless the authorization is terminated  or revoked.  Performed at [facility], [address] [place], [place] [address]     Renal Function: Recent Labs    01/02/23 1521  CREATININE 0.72   Estimated Creatinine Clearance: 102.6 mL/min (by C-G formula based on SCr of 0.72 mg/dL).  Radiologic Imaging: DG Chest Port 1 View  Result Date: 01/02/2023 CLINICAL DATA:  Cough EXAM: PORTABLE CHEST 1 VIEW COMPARISON:  10/13/2022 FINDINGS: The heart size and mediastinal contours are within normal limits. Both lungs are clear. The  visualized skeletal structures are unremarkable. Mild central airways thickening. IMPRESSION: No active disease. Mild central airways thickening suggesting bronchitis or reactive airways. Mild central airways thickening. No focal airspace disease. Electronically Signed   By: Jasmine Pang M.D.   On: 01/02/2023 21:31   CT Renal Stone Study  Result Date: 01/02/2023 CLINICAL DATA:  Abdominal and flank pain EXAM: CT ABDOMEN AND PELVIS WITHOUT CONTRAST TECHNIQUE: Multidetector CT imaging of the abdomen and pelvis was performed following the standard protocol without IV contrast. RADIATION DOSE REDUCTION: This exam was performed according to the departmental dose-optimization program which includes automated exposure control, adjustment of the mA and/or kV according to patient size and/or use of iterative reconstruction technique. COMPARISON:  CT abdomen and pelvis 06/27/2022 FINDINGS: Lower chest: There some new focal patchy airspace disease in the right lower lobe. Hepatobiliary: No focal liver abnormality is seen. No gallstones, gallbladder wall thickening, or biliary dilatation. Pancreas: Unremarkable. No pancreatic ductal dilatation or surrounding inflammatory changes. Spleen: Normal in size without focal abnormality. Adrenals/Urinary Tract: There is a 2 mm calculus in the mid left ureter. There is no significant hydronephrosis. No other urinary tract calculi are seen. The adrenal glands, kidneys  and bladder are otherwise within normal limits. Stomach/Bowel: Stomach is within normal limits. Appendix appears normal. No evidence of bowel wall thickening, distention, or inflammatory changes. Vascular/Lymphatic: No significant vascular findings are present. No enlarged abdominal or pelvic lymph nodes. Reproductive: Uterus and bilateral adnexa are unremarkable. Other: No abdominal wall hernia or abnormality. There are mildly enlarged bilateral inguinal lymph nodes as seen on the prior study. No abdominopelvic ascites. Musculoskeletal: No acute or significant osseous findings. IMPRESSION: 1. 2 mm calculus in the mid left ureter without significant hydronephrosis. 2. New focal patchy airspace disease in the right lower lobe worrisome for pneumonia. Electronically Signed   By: Darliss Cheney M.D.   On: 01/02/2023 19:14    I independently reviewed the above imaging studies.  Impression/Recommendation: Right pyelonephritis:  Continue empiric antibiotic therapy and await urine cultures with plans to treat with appropriate therapy for 7-10 days. 2.   Left ureteral calculus: This appears to be an incidental finding without obstruction.  In the absence of symptoms on the left side and no fever or other signs of systemic infection, would strain urine and proceed with a trial of spontaneous passage.  Will notify Dr. Marlou Porch of her admission.  Crecencio Mc 01/03/2023, 7:56 AM  Moody Bruins. MD   CC: Dr. Jerral Ralph

## 2023-01-03 NOTE — Discharge Summary (Signed)
Physician Discharge Summary  Courtney [name]:[id number] DOB: [birth year] DOA: 01/02/2023  PCP: Eden Emms, NP  Admit date: 01/02/2023 Discharge date: 01/03/2023  Admitted From: Home Disposition: Home  Recommendations for Outpatient Follow-up:  Follow up with PCP in 1-2 weeks Please obtain BMP/CBC in one week   Home Health: N/A Equipment/Devices: N/A  Discharge Condition: Stable CODE STATUS: Full code Diet recommendation: Regular diet  Discharge summary: 32 year old with history of polysubstance abuse, hidradenitis suppurativa currently on Remicade, chronic suppressive antibiotic therapy with doxycycline twice daily who came to the emergency room with intermittent right flank pain for a long time, she had some nausea and vomiting.  In the emergency room she was medically stable.  Afebrile.  Blood pressures 93/70.  On room air.  WBC count was 11.5.  CMP was unremarkable.  UA was abnormal with positive nitrite and many bacteria.  CT scan abdomen pelvis with evidence of 2 mm left ureteric stone, unobstructed, no evidence of hydronephrosis.  CT scan with no evidence of pyelonephritis/infection or obstruction.  On her CT scan of the abdomen there was also some evidence of right lower lobe opacity.  Chest x-ray did show some peribronchial cuffing.  She also had some cough ongoing for more than a week.  In the emergency room, even though she was hemodynamically stable, it was determined that she is immunocompromised and advised IV antibiotics.  Overnight she remains stable.  There is no evidence of sepsis or SIRS.  Patient without flank pain.  No urinary symptoms today.  She has some dry cough.  Given patient's relative stability, patient is able to go home and manage symptoms at home. Final urine cultures are pending, she is already on chronic suppressive therapy with doxycycline.  She will take Bactrim for 10 days that was prescribed from ER last night. Patient is on multiple medications  including for anxiety and bipolar disorder that she will continue.  Patient is prescribed Suboxone that she is due to start. Patient was seen by urology in regards to 2 mm nonobstructing stone, follow-up recommended.  No intervention needed.   Discharge Diagnoses:  Principal Problem:   Acute cystitis Active Problems:   Left ureteral stone   Obesity (BMI 35.0-39.9 without comorbidity)   Hidradenitis suppurativa   Hypotension   CAP (community acquired pneumonia)   Polysubstance abuse [facility])    Discharge Instructions  Discharge Instructions     Call MD for:  severe uncontrolled pain   Complete by: As directed    Call MD for:  temperature >100.4   Complete by: As directed    Diet general   Complete by: As directed    Increase activity slowly   Complete by: As directed       Allergies as of 01/03/2023       Reactions   Other Hives   ABD pad causes rash Pt can only tolerate cloth tape Pt can only tolerate cloth tape   Shellfish Allergy Hives   Tapentadol Hives, Other (See Comments)   Pt can only tolerate cloth tape   Tape Hives, Other (See Comments)   Pt can only tolerate cloth tape   Ginger Hives   Morphine And Codeine    " I become very angry"   Vancomycin Other (See Comments)   Burns her vein really bad and always causes them to blow.        Medication List     STOP taking these medications    ondansetron 4 MG tablet Commonly known  as: Zofran       TAKE these medications    albuterol 108 (90 Base) MCG/ACT inhaler Commonly known as: VENTOLIN HFA TAKE 2 PUFFS BY MOUTH EVERY 6 HOURS AS NEEDED FOR WHEEZE OR SHORTNESS OF BREATH   ARIPiprazole 2 MG tablet Commonly known as: ABILIFY Take by mouth.   Azelastine HCl 137 MCG/SPRAY Soln   bifidobacterium infantis capsule Take 1 capsule by mouth daily.   Bimzelx 160 MG/ML pen Generic drug: bimekizumab-bkzx Inject into the skin.   busPIRone 10 MG tablet Commonly known as: BUSPAR TAKE 1 TABLET BY  MOUTH TWICE A DAY   Contour Test test strip Generic drug: glucose blood And lancets #100   diphenhydrAMINE 25 mg capsule Commonly known as: BENADRYL 25 mg every 6 (six) hours as needed.   EPINEPHrine 0.3 mg/0.3 mL Soaj injection Commonly known as: EpiPen 2-Pak Inject 0.3 mg into the muscle as needed for anaphylaxis.   EPINEPHrine 0.3 mg/0.3 mL Soaj injection Commonly known as: EPI-PEN Inject 0.3 mg into the muscle as needed for anaphylaxis.   fluconazole 150 MG tablet Commonly known as: DIFLUCAN Take 150 mg by mouth once.   gabapentin 600 MG tablet Commonly known as: NEURONTIN PLEASE SEE ATTACHED FOR DETAILED DIRECTIONS   HYDROcodone-acetaminophen 7.5-325 mg/15 ml solution Commonly known as: HYCET Take 5mL every six hours as needed for pain.   hydrOXYzine 25 MG tablet Commonly known as: ATARAX TAKE 1 TABLET BY MOUTH EVERY 8 HOURS AS NEEDED FOR ITCHING.   Iron-Vitamin C 100-250 MG Tabs Take 1 tablet by mouth daily.   lamoTRIgine 100 MG tablet Commonly known as: LAMICTAL TAKE 1/2 TABLET TWICE A DAY BY MOUTH   meloxicam 7.5 MG tablet Commonly known as: MOBIC Take 7.5 mg by mouth 2 (two) times daily as needed.   methylphenidate 20 MG tablet Commonly known as: Ritalin Take 1 tablet (20 mg total) by mouth daily before breakfast.   Microlet Lancets Misc See admin instructions.   montelukast 10 MG tablet Commonly known as: SINGULAIR TAKE 1 TABLET BY MOUTH EVERYDAY AT BEDTIME   norethindrone 0.35 MG tablet Commonly known as: MICRONOR Take 1 tablet by mouth daily.   nystatin powder Commonly known as: MYCOSTATIN/NYSTOP SMARTSIG:1 Topical Daily   nystatin ointment Commonly known as: MYCOSTATIN Apply topically 2 (two) times daily.   ofloxacin 0.3 % OTIC solution Commonly known as: FLOXIN PLACE 10 DROPS INTO BOTH EARS DAILY.   ondansetron 4 MG disintegrating tablet Commonly known as: ZOFRAN-ODT Take 1 tablet (4 mg total) by mouth every 8 (eight) hours as  needed for nausea or vomiting.   oxyCODONE 5 MG immediate release tablet Commonly known as: Roxicodone Take 1 tablet (5 mg total) by mouth every 6 (six) hours as needed for severe pain.   predniSONE 20 MG tablet Commonly known as: DELTASONE Take 2 tablets (40 mg total) by mouth daily.   REMICADE IV Inject 1 Dose into the vein every 30 (thirty) days.   inFLIXimab 100 MG injection Commonly known as: REMICADE   rizatriptan 10 MG tablet Commonly known as: MAXALT See Admin Instructions. PLEASE SEE ATTACHED FOR DETAILED DIRECTIONS   sulfamethoxazole-trimethoprim 800-160 MG tablet Commonly known as: BACTRIM DS Take 1 tablet by mouth 2 (two) times daily for 10 days.   SUMAtriptan 100 MG tablet Commonly known as: IMITREX Take by mouth.   tobramycin-dexamethasone ophthalmic solution Commonly known as: TOBRADEX 1 drop 4 (four) times daily.   traMADol 50 MG tablet Commonly known as: ULTRAM Take by mouth.   traZODone  50 MG tablet Commonly known as: DESYREL TAKE 1 TABLET BY MOUTH EVERYDAY AT BEDTIME   triamcinolone ointment 0.1 % Commonly known as: KENALOG PLEASE SEE ATTACHED FOR DETAILED DIRECTIONS   venlafaxine 75 MG tablet Commonly known as: EFFEXOR INCREASE EFFEXOR TO 37.5 MG IN THE MORNING AND 75 MG AT NIGHT FOR 1 WEEK, THEN INCREASE TO 75 MG TWICE DAILY FOR HEADACHES   Vitamin D (Ergocalciferol) 1.25 MG (50000 UNIT) Caps capsule Commonly known as: DRISDOL Take 1 capsule (50,000 Units total) by mouth every 7 (seven) days.        Follow-up Information     Eden Emms, NP In 1 week.   Specialties: Nurse Practitioner, Family Medicine Contact information: [address] [place] [address] [phone]                Allergies  Allergen Reactions   Other Hives    ABD pad causes rash Pt can only tolerate cloth tape Pt can only tolerate cloth tape   Shellfish Allergy Hives   Tapentadol Hives and Other (See Comments)    Pt can only tolerate  cloth tape   Tape Hives and Other (See Comments)    Pt can only tolerate cloth tape   Ginger Hives   Morphine And Codeine     " I become very angry"   Vancomycin Other (See Comments)    Burns her vein really bad and always causes them to blow.    Consultations: Urology   Procedures/Studies: [facility] Chest Port 1 View  Result Date: 01/02/2023 CLINICAL DATA:  Cough EXAM: PORTABLE CHEST 1 VIEW COMPARISON:  10/13/2022 FINDINGS: The heart size and mediastinal contours are within normal limits. Both lungs are clear. The visualized skeletal structures are unremarkable. Mild central airways thickening. IMPRESSION: No active disease. Mild central airways thickening suggesting bronchitis or reactive airways. Mild central airways thickening. No focal airspace disease. Electronically Signed   By: Jasmine Pang M.D.   On: 01/02/2023 21:31   CT Renal Stone Study  Result Date: 01/02/2023 CLINICAL DATA:  Abdominal and flank pain EXAM: CT ABDOMEN AND PELVIS WITHOUT CONTRAST TECHNIQUE: Multidetector CT imaging of the abdomen and pelvis was performed following the standard protocol without IV contrast. RADIATION DOSE REDUCTION: This exam was performed according to the departmental dose-optimization program which includes automated exposure control, adjustment of the mA and/or kV according to patient size and/or use of iterative reconstruction technique. COMPARISON:  CT abdomen and pelvis 06/27/2022 FINDINGS: Lower chest: There some new focal patchy airspace disease in the right lower lobe. Hepatobiliary: No focal liver abnormality is seen. No gallstones, gallbladder wall thickening, or biliary dilatation. Pancreas: Unremarkable. No pancreatic ductal dilatation or surrounding inflammatory changes. Spleen: Normal in size without focal abnormality. Adrenals/Urinary Tract: There is a 2 mm calculus in the mid left ureter. There is no significant hydronephrosis. No other urinary tract calculi are seen. The adrenal glands,  kidneys and bladder are otherwise within normal limits. Stomach/Bowel: Stomach is within normal limits. Appendix appears normal. No evidence of bowel wall thickening, distention, or inflammatory changes. Vascular/Lymphatic: No significant vascular findings are present. No enlarged abdominal or pelvic lymph nodes. Reproductive: Uterus and bilateral adnexa are unremarkable. Other: No abdominal wall hernia or abnormality. There are mildly enlarged bilateral inguinal lymph nodes as seen on the prior study. No abdominopelvic ascites. Musculoskeletal: No acute or significant osseous findings. IMPRESSION: 1. 2 mm calculus in the mid left ureter without significant hydronephrosis. 2. New focal patchy airspace disease in the right  lower lobe worrisome for pneumonia. Electronically Signed   By: Darliss Cheney M.D.   On: 01/02/2023 19:14   (Echo, Carotid, EGD, Colonoscopy, ERCP)    Subjective: Patient seen and examined in the morning rounds.  Denies any complaints.  She has some dry cough but on room air.  Denies any pain or dysuria.  Comfortable with plan to go home.  Afebrile since admission.  Blood pressure stable since admission.   Discharge Exam: Vitals:   01/02/23 2145 01/03/23 0546  BP: 99/72 108/65  Pulse: 76 70  Resp: 18 15  Temp: 98.4 F (36.9 C) 97.9 F (36.6 C)  SpO2: 99% 98%   Vitals:   01/02/23 1900 01/02/23 2050 01/02/23 2145 01/03/23 0546  BP: 103/69 92/64 99/72  108/65  Pulse: 72 88 76 70  Resp: 16 16 18 15   Temp:   98.4 F (36.9 C) 97.9 F (36.6 C)  TempSrc:   Oral   SpO2: 100% 100% 99% 98%  Weight:      Height:        General: Pt is alert, awake, not in acute distress.  Patient does have multiple areas of scarring due to her hidradenitis, there is no evidence of active infection or abscess. Cardiovascular: RRR, S1/S2 +, no rubs, no gallops Respiratory: CTA bilaterally, no wheezing, no rhonchi Abdominal: Soft, NT, ND, bowel sounds + Extremities: no edema, no  cyanosis    The results of significant diagnostics from this hospitalization (including imaging, microbiology, ancillary and laboratory) are listed below for reference.     Microbiology: Recent Results (from the past 240 hour(s))  Group A Strep by PCR     Status: None   Collection Time: 12/26/22  6:46 AM   Specimen: Throat; Sterile Swab  Result Value Ref Range Status   Group A Strep by PCR NOT DETECTED NOT DETECTED Final    Comment: Performed at [facility], [address] [place], [place] [address]  Resp panel by RT-PCR (RSV, Flu A&B, Covid) Anterior Nasal Swab     Status: None   Collection Time: 12/26/22  6:46 AM   Specimen: Anterior Nasal Swab  Result Value Ref Range Status   SARS Coronavirus 2 by RT PCR NEGATIVE NEGATIVE Final    Comment: (NOTE) SARS-CoV-2 target nucleic acids are NOT DETECTED.  The SARS-CoV-2 RNA is generally detectable in upper respiratory specimens during the acute phase of infection. The lowest concentration of SARS-CoV-2 viral copies this assay can detect is 138 copies/mL. A negative result does not preclude SARS-Cov-2 infection and should not be used as the sole basis for treatment or other patient management decisions. A negative result may occur with  improper specimen collection/handling, submission of specimen other than nasopharyngeal swab, presence of viral mutation(s) within the areas targeted by this assay, and inadequate number of viral copies(<138 copies/mL). A negative result must be combined with clinical observations, patient history, and epidemiological information. The expected result is Negative.  Fact Sheet for Patients:  BloggerCourse.com  Fact Sheet for Healthcare Providers:  SeriousBroker.it  This test is no t yet approved or cleared by the [place] FDA and  has been authorized for detection and/or diagnosis of SARS-CoV-2 by FDA under an Emergency Use  Authorization (EUA). This EUA will remain  in effect (meaning this test can be used) for the duration of the COVID-19 declaration under Section 564(b)(1) of the Act, 21 U.S.C.section 360bbb-3(b)(1), unless the authorization is terminated  or revoked sooner.       Influenza A  by PCR NEGATIVE NEGATIVE Final   Influenza B by PCR NEGATIVE NEGATIVE Final    Comment: (NOTE) The Xpert Xpress SARS-CoV-2/FLU/RSV plus assay is intended as an aid in the diagnosis of influenza from Nasopharyngeal swab specimens and should not be used as a sole basis for treatment. Nasal washings and aspirates are unacceptable for Xpert Xpress SARS-CoV-2/FLU/RSV testing.  Fact Sheet for Patients: BloggerCourse.com  Fact Sheet for Healthcare Providers: SeriousBroker.it  This test is not yet approved or cleared by the [place] FDA and has been authorized for detection and/or diagnosis of SARS-CoV-2 by FDA under an Emergency Use Authorization (EUA). This EUA will remain in effect (meaning this test can be used) for the duration of the COVID-19 declaration under Section 564(b)(1) of the Act, 21 U.S.C. section 360bbb-3(b)(1), unless the authorization is terminated or revoked.     Resp Syncytial Virus by PCR NEGATIVE NEGATIVE Final    Comment: (NOTE) Fact Sheet for Patients: BloggerCourse.com  Fact Sheet for Healthcare Providers: SeriousBroker.it  This test is not yet approved or cleared by the [place] FDA and has been authorized for detection and/or diagnosis of SARS-CoV-2 by FDA under an Emergency Use Authorization (EUA). This EUA will remain in effect (meaning this test can be used) for the duration of the COVID-19 declaration under Section 564(b)(1) of the Act, 21 U.S.C. section 360bbb-3(b)(1), unless the authorization is terminated or revoked.  Performed at [facility], [address], [place], [place] [address]   Resp panel by RT-PCR (RSV, Flu A&B, Covid) Anterior Nasal Swab     Status: None   Collection Time: 01/02/23  8:04 PM   Specimen: Anterior Nasal Swab  Result Value Ref Range Status   SARS Coronavirus 2 by RT PCR NEGATIVE NEGATIVE Final    Comment: (NOTE) SARS-CoV-2 target nucleic acids are NOT DETECTED.  The SARS-CoV-2 RNA is generally detectable in upper respiratory specimens during the acute phase of infection. The lowest concentration of SARS-CoV-2 viral copies this assay can detect is 138 copies/mL. A negative result does not preclude SARS-Cov-2 infection and should not be used as the sole basis for treatment or other patient management decisions. A negative result may occur with  improper specimen collection/handling, submission of specimen other than nasopharyngeal swab, presence of viral mutation(s) within the areas targeted by this assay, and inadequate number of viral copies(<138 copies/mL). A negative result must be combined with clinical observations, patient history, and epidemiological information. The expected result is Negative.  Fact Sheet for Patients:  BloggerCourse.com  Fact Sheet for Healthcare Providers:  SeriousBroker.it  This test is no t yet approved or cleared by the [place] FDA and  has been authorized for detection and/or diagnosis of SARS-CoV-2 by FDA under an Emergency Use Authorization (EUA). This EUA will remain  in effect (meaning this test can be used) for the duration of the COVID-19 declaration under Section 564(b)(1) of the Act, 21 U.S.C.section 360bbb-3(b)(1), unless the authorization is terminated  or revoked sooner.       Influenza A by PCR NEGATIVE NEGATIVE Final   Influenza B by PCR NEGATIVE NEGATIVE Final    Comment: (NOTE) The Xpert Xpress SARS-CoV-2/FLU/RSV plus assay is intended as an aid in the diagnosis of influenza from  Nasopharyngeal swab specimens and should not be used as a sole basis for treatment. Nasal washings and aspirates are unacceptable for Xpert Xpress SARS-CoV-2/FLU/RSV testing.  Fact Sheet for Patients: BloggerCourse.com  Fact Sheet for Healthcare Providers: SeriousBroker.it  This test is not yet  approved or cleared by the [place] and has been authorized for detection and/or diagnosis of SARS-CoV-2 by FDA under an Emergency Use Authorization (EUA). This EUA will remain in effect (meaning this test can be used) for the duration of the COVID-19 declaration under Section 564(b)(1) of the Act, 21 U.S.C. section 360bbb-3(b)(1), unless the authorization is terminated or revoked.     Resp Syncytial Virus by PCR NEGATIVE NEGATIVE Final    Comment: (NOTE) Fact Sheet for Patients: BloggerCourse.com  Fact Sheet for Healthcare Providers: SeriousBroker.it  This test is not yet approved or cleared by the [place] FDA and has been authorized for detection and/or diagnosis of SARS-CoV-2 by FDA under an Emergency Use Authorization (EUA). This EUA will remain in effect (meaning this test can be used) for the duration of the COVID-19 declaration under Section 564(b)(1) of the Act, 21 U.S.C. section 360bbb-3(b)(1), unless the authorization is terminated or revoked.  Performed at [facility], [address], [place], [place] [address]      Labs: BNP (last 3 results) No results for input(s): "BNP" in the last 8760 hours. Basic Metabolic Panel: Recent Labs  Lab 01/02/23 1521  NA 136  K 3.7  CL 104  CO2 26  GLUCOSE 71  BUN 12  CREATININE 0.72  CALCIUM 9.4   Liver Function Tests: Recent Labs  Lab 01/02/23 1521  AST 17  ALT 14  ALKPHOS 90  BILITOT 0.4  PROT 8.8*  ALBUMIN 3.7   No results for input(s): "LIPASE", "AMYLASE" in the last 168  hours. No results for input(s): "AMMONIA" in the last 168 hours. CBC: Recent Labs  Lab 01/02/23 1521 01/03/23 0328  WBC 11.5* 10.3  NEUTROABS 5.9  --   HGB 12.6 11.2*  HCT 37.2 35.0*  MCV 88.8 92.8  PLT 349 288   Cardiac Enzymes: No results for input(s): "CKTOTAL", "CKMB", "CKMBINDEX", "TROPONINI" in the last 168 hours. BNP: Invalid input(s): "POCBNP" CBG: No results for input(s): "GLUCAP" in the last 168 hours. D-Dimer No results for input(s): "DDIMER" in the last 72 hours. Hgb A1c No results for input(s): "HGBA1C" in the last 72 hours. Lipid Profile No results for input(s): "CHOL", "HDL", "LDLCALC", "TRIG", "CHOLHDL", "LDLDIRECT" in the last 72 hours. Thyroid function studies No results for input(s): "TSH", "T4TOTAL", "T3FREE", "THYROIDAB" in the last 72 hours.  Invalid input(s): "FREET3" Anemia work up No results for input(s): "VITAMINB12", "FOLATE", "FERRITIN", "TIBC", "IRON", "RETICCTPCT" in the last 72 hours. Urinalysis    Component Value Date/Time   COLORURINE YELLOW 01/02/2023 1536   APPEARANCEUR HAZY (A) 01/02/2023 1536   LABSPEC 1.024 01/02/2023 1536   PHURINE 6.0 01/02/2023 1536   GLUCOSEU NEGATIVE 01/02/2023 1536   HGBUR NEGATIVE 01/02/2023 1536   BILIRUBINUR NEGATIVE 01/02/2023 1536   BILIRUBINUR negative 08/21/2022 1818   BILIRUBINUR Negative 05/20/2012 1626   KETONESUR NEGATIVE 01/02/2023 1536   PROTEINUR TRACE (A) 01/02/2023 1536   UROBILINOGEN 0.2 08/21/2022 1818   NITRITE POSITIVE (A) 01/02/2023 1536   LEUKOCYTESUR SMALL (A) 01/02/2023 1536   Sepsis Labs Recent Labs  Lab 01/02/23 1521 01/03/23 0328  WBC 11.5* 10.3   Microbiology Recent Results (from the past 240 hour(s))  Group A Strep by PCR     Status: None   Collection Time: 12/26/22  6:46 AM   Specimen: Throat; Sterile Swab  Result Value Ref Range Status   Group A Strep by PCR NOT DETECTED NOT DETECTED Final    Comment: Performed at [facility], [address]  [place], [place], [place] [address]  Resp panel by RT-PCR (RSV, Flu A&B, Covid) Anterior Nasal Swab     Status: None   Collection Time: 12/26/22  6:46 AM   Specimen: Anterior Nasal Swab  Result Value Ref Range Status   SARS Coronavirus 2 by RT PCR NEGATIVE NEGATIVE Final    Comment: (NOTE) SARS-CoV-2 target nucleic acids are NOT DETECTED.  The SARS-CoV-2 RNA is generally detectable in upper respiratory specimens during the acute phase of infection. The lowest concentration of SARS-CoV-2 viral copies this assay can detect is 138 copies/mL. A negative result does not preclude SARS-Cov-2 infection and should not be used as the sole basis for treatment or other patient management decisions. A negative result may occur with  improper specimen collection/handling, submission of specimen other than nasopharyngeal swab, presence of viral mutation(s) within the areas targeted by this assay, and inadequate number of viral copies(<138 copies/mL). A negative result must be combined with clinical observations, patient history, and epidemiological information. The expected result is Negative.  Fact Sheet for Patients:  BloggerCourse.com  Fact Sheet for Healthcare Providers:  SeriousBroker.it  This test is no t yet approved or cleared by the [place] FDA and  has been authorized for detection and/or diagnosis of SARS-CoV-2 by FDA under an Emergency Use Authorization (EUA). This EUA will remain  in effect (meaning this test can be used) for the duration of the COVID-19 declaration under Section 564(b)(1) of the Act, 21 U.S.C.section 360bbb-3(b)(1), unless the authorization is terminated  or revoked sooner.       Influenza A by PCR NEGATIVE NEGATIVE Final   Influenza B by PCR NEGATIVE NEGATIVE Final    Comment: (NOTE) The Xpert Xpress SARS-CoV-2/FLU/RSV plus assay is intended as an aid in the diagnosis of influenza from Nasopharyngeal swab  specimens and should not be used as a sole basis for treatment. Nasal washings and aspirates are unacceptable for Xpert Xpress SARS-CoV-2/FLU/RSV testing.  Fact Sheet for Patients: BloggerCourse.com  Fact Sheet for Healthcare Providers: SeriousBroker.it  This test is not yet approved or cleared by the [place] FDA and has been authorized for detection and/or diagnosis of SARS-CoV-2 by FDA under an Emergency Use Authorization (EUA). This EUA will remain in effect (meaning this test can be used) for the duration of the COVID-19 declaration under Section 564(b)(1) of the Act, 21 U.S.C. section 360bbb-3(b)(1), unless the authorization is terminated or revoked.     Resp Syncytial Virus by PCR NEGATIVE NEGATIVE Final    Comment: (NOTE) Fact Sheet for Patients: BloggerCourse.com  Fact Sheet for Healthcare Providers: SeriousBroker.it  This test is not yet approved or cleared by the [place] FDA and has been authorized for detection and/or diagnosis of SARS-CoV-2 by FDA under an Emergency Use Authorization (EUA). This EUA will remain in effect (meaning this test can be used) for the duration of the COVID-19 declaration under Section 564(b)(1) of the Act, 21 U.S.C. section 360bbb-3(b)(1), unless the authorization is terminated or revoked.  Performed at [facility], [address], [place], [place] [address]   Resp panel by RT-PCR (RSV, Flu A&B, Covid) Anterior Nasal Swab     Status: None   Collection Time: 01/02/23  8:04 PM   Specimen: Anterior Nasal Swab  Result Value Ref Range Status   SARS Coronavirus 2 by RT PCR NEGATIVE NEGATIVE Final    Comment: (NOTE) SARS-CoV-2 target nucleic acids are NOT DETECTED.  The SARS-CoV-2 RNA is generally detectable in upper respiratory specimens during the acute phase of  infection. The lowest concentration of  SARS-CoV-2 viral copies this assay can detect is 138 copies/mL. A negative result does not preclude SARS-Cov-2 infection and should not be used as the sole basis for treatment or other patient management decisions. A negative result may occur with  improper specimen collection/handling, submission of specimen other than nasopharyngeal swab, presence of viral mutation(s) within the areas targeted by this assay, and inadequate number of viral copies(<138 copies/mL). A negative result must be combined with clinical observations, patient history, and epidemiological information. The expected result is Negative.  Fact Sheet for Patients:  BloggerCourse.com  Fact Sheet for Healthcare Providers:  SeriousBroker.it  This test is no t yet approved or cleared by the [place] FDA and  has been authorized for detection and/or diagnosis of SARS-CoV-2 by FDA under an Emergency Use Authorization (EUA). This EUA will remain  in effect (meaning this test can be used) for the duration of the COVID-19 declaration under Section 564(b)(1) of the Act, 21 U.S.C.section 360bbb-3(b)(1), unless the authorization is terminated  or revoked sooner.       Influenza A by PCR NEGATIVE NEGATIVE Final   Influenza B by PCR NEGATIVE NEGATIVE Final    Comment: (NOTE) The Xpert Xpress SARS-CoV-2/FLU/RSV plus assay is intended as an aid in the diagnosis of influenza from Nasopharyngeal swab specimens and should not be used as a sole basis for treatment. Nasal washings and aspirates are unacceptable for Xpert Xpress SARS-CoV-2/FLU/RSV testing.  Fact Sheet for Patients: BloggerCourse.com  Fact Sheet for Healthcare Providers: SeriousBroker.it  This test is not yet approved or cleared by the [place] FDA and has been authorized for detection and/or diagnosis of SARS-CoV-2 by FDA under an Emergency Use  Authorization (EUA). This EUA will remain in effect (meaning this test can be used) for the duration of the COVID-19 declaration under Section 564(b)(1) of the Act, 21 U.S.C. section 360bbb-3(b)(1), unless the authorization is terminated or revoked.     Resp Syncytial Virus by PCR NEGATIVE NEGATIVE Final    Comment: (NOTE) Fact Sheet for Patients: BloggerCourse.com  Fact Sheet for Healthcare Providers: SeriousBroker.it  This test is not yet approved or cleared by the [place] FDA and has been authorized for detection and/or diagnosis of SARS-CoV-2 by FDA under an Emergency Use Authorization (EUA). This EUA will remain in effect (meaning this test can be used) for the duration of the COVID-19 declaration under Section 564(b)(1) of the Act, 21 U.S.C. section 360bbb-3(b)(1), unless the authorization is terminated or revoked.  Performed at [facility], [address] [place], [place] [address]      Time coordinating discharge: 32 minutes  SIGNED:   Dorcas Carrow, MD  [facility] 01/03/2023, 11:23 AM

## 2023-01-03 NOTE — Plan of Care (Addendum)
Patient discharging home via private vehicle with mother [name] following administration of IV antibiotic and discharge education.  Haydee Salter, RN 01/03/23 1:32 PM

## 2023-01-03 NOTE — Progress Notes (Signed)
   01/03/23 1123  TOC Brief Assessment  Insurance and Status Reviewed  Patient has primary care physician Yes  Home environment has been reviewed home with family  Prior level of function: independent  Prior/Current Home Services No current home services  Social Determinants of Health Reivew SDOH reviewed no interventions necessary  Readmission risk has been reviewed Yes  Transition of care needs no transition of care needs at this time

## 2023-01-03 NOTE — Unmapped (Signed)
She called and states that she was seen yesterday and was prescribed suboxone and she thought she was getting the film but the pharmacy states it is pill form.  Which delivery route?  She also states that she we seen recently in an ER and was given Toradol and this really helps with her kidney pain.  Rec?

## 2023-01-04 ENCOUNTER — Other Ambulatory Visit: Payer: Self-pay

## 2023-01-04 ENCOUNTER — Emergency Department (HOSPITAL_COMMUNITY)
Admission: EM | Admit: 2023-01-04 | Discharge: 2023-01-05 | Disposition: A | Discharge status: Home / Self Care | Payer: BC Managed Care – PPO | Attending: Emergency Medicine | Admitting: Emergency Medicine

## 2023-01-04 ENCOUNTER — Other Ambulatory Visit: Payer: Self-pay | Admitting: Internal Medicine

## 2023-01-04 ENCOUNTER — Encounter (HOSPITAL_COMMUNITY): Payer: Self-pay

## 2023-01-04 DIAGNOSIS — Z638 Other specified problems related to primary support group: Secondary | ICD-10-CM | POA: Insufficient documentation

## 2023-01-04 LAB — URINE CULTURE: Culture: >=100000 — AB

## 2023-01-04 MED ORDER — CEPHALEXIN 500 MG PO CAPS: 500.0000 mg | ORAL_CAPSULE | Freq: Three times a day (TID) | ORAL | 0 refills | Status: AC

## 2023-01-04 NOTE — ED Triage Notes (Signed)
Patient reports that she was brought here by the [facility] Department. States she needs to detox from drugs. Patient states her last drug use was yesterday (oxycodone and meth). Patient was scheduled to start suboxone. Patient was recently admitted and discharged from [facility] on Wednesday. States she is having pain everywhere. Also has SI thoughts. States "I just don't want to be here anymore"

## 2023-01-05 LAB — ETHANOL: Alcohol, Ethyl (B): <10 mg/dL (ref <10)

## 2023-01-05 LAB — COMPREHENSIVE METABOLIC PANEL
ALT: 17 U/L (ref 0–44)
AST: 17 U/L (ref 15–41)
Albumin: 3.6 g/dL (ref 3.5–5.0)
Alkaline Phosphatase: 93 U/L (ref 38–126)
Anion gap: 10 (ref 5–15)
BUN: 9 mg/dL (ref 6–20)
CO2: 23 mmol/L (ref 22–32)
Calcium: 9.8 mg/dL (ref 8.9–10.3)
Chloride: 100 mmol/L (ref 98–111)
Creatinine, Ser: 0.84 mg/dL (ref 0.44–1.00)
GFR, Estimated: >60 mL/min (ref >60)
Glucose, Bld: 96 mg/dL (ref 70–99)
Potassium: 3.6 mmol/L (ref 3.5–5.1)
Sodium: 133 mmol/L — ABNORMAL LOW (ref 135–145)
Total Bilirubin: 0.3 mg/dL (ref 0.3–1.2)
Total Protein: 9 g/dL — ABNORMAL HIGH (ref 6.5–8.1)

## 2023-01-05 LAB — CBC
HCT: 39.8 % (ref 36.0–46.0)
Hemoglobin: 12.7 g/dL (ref 12.0–15.0)
MCH: 29.5 pg (ref 26.0–34.0)
MCHC: 31.9 g/dL (ref 30.0–36.0)
MCV: 92.3 fL (ref 80.0–100.0)
Platelets: 344 10*3/uL (ref 150–400)
RBC: 4.31 MIL/uL (ref 3.87–5.11)
RDW: 13.3 % (ref 11.5–15.5)
WBC: 14.7 10*3/uL — ABNORMAL HIGH (ref 4.0–10.5)
nRBC: 0 % (ref 0.0–0.2)

## 2023-01-05 LAB — HCG, SERUM, QUALITATIVE: Preg, Serum: NEGATIVE

## 2023-01-05 LAB — SALICYLATE LEVEL: Salicylate Lvl: <7 mg/dL — ABNORMAL LOW (ref 7.0–30.0)

## 2023-01-05 LAB — ACETAMINOPHEN LEVEL: Acetaminophen (Tylenol), Serum: <10 ug/mL — ABNORMAL LOW (ref 10–30)
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
## 2023-01-05 NOTE — ED Notes (Signed)
Patient denied vitals. Patient was already re-dressed in [employer].

## 2023-01-08 DIAGNOSIS — L732 Hidradenitis suppurativa: Principal | ICD-10-CM

## 2023-01-13 ENCOUNTER — Encounter (HOSPITAL_BASED_OUTPATIENT_CLINIC_OR_DEPARTMENT_OTHER): Payer: Self-pay

## 2023-01-13 ENCOUNTER — Other Ambulatory Visit: Payer: Self-pay

## 2023-01-13 DIAGNOSIS — B370 Candidal stomatitis: Secondary | ICD-10-CM | POA: Diagnosis not present

## 2023-01-13 DIAGNOSIS — J029 Acute pharyngitis, unspecified: Secondary | ICD-10-CM | POA: Diagnosis not present

## 2023-01-13 NOTE — ED Triage Notes (Signed)
Pt to triage c/o cough and sore throat x 2 days. PT presents with copious amounts white oral secretions. PT reports she recently started Suboxone and Topamax. PT VSS NAD PT on room air. No fever at this time.

## 2023-01-14 ENCOUNTER — Emergency Department (HOSPITAL_BASED_OUTPATIENT_CLINIC_OR_DEPARTMENT_OTHER)
Admission: EM | Admit: 2023-01-14 | Discharge: 2023-01-14 | Disposition: A | Discharge status: Home / Self Care | Payer: BC Managed Care – PPO | Attending: Emergency Medicine | Admitting: Emergency Medicine

## 2023-01-14 DIAGNOSIS — B370 Candidal stomatitis: Secondary | ICD-10-CM

## 2023-01-14 LAB — GROUP A STREP BY PCR: Group A Strep by PCR: NOT DETECTED

## 2023-01-14 LAB — BASIC METABOLIC PANEL
Anion gap: 6 (ref 5–15)
BUN: 12 mg/dL (ref 6–20)
CO2: 25 mmol/L (ref 22–32)
Calcium: 9.4 mg/dL (ref 8.9–10.3)
Chloride: 106 mmol/L (ref 98–111)
Creatinine, Ser: 1.01 mg/dL — ABNORMAL HIGH (ref 0.44–1.00)
GFR, Estimated: >60 mL/min (ref >60)
Glucose, Bld: 86 mg/dL (ref 70–99)
Potassium: 3.9 mmol/L (ref 3.5–5.1)
Sodium: 137 mmol/L (ref 135–145)

## 2023-01-14 LAB — CBC WITH DIFFERENTIAL/PLATELET
Abs Immature Granulocytes: 0.03 10*3/uL (ref 0.00–0.07)
Basophils Absolute: 0.1 10*3/uL (ref 0.0–0.1)
Basophils Relative: 1 %
Eosinophils Absolute: 0.5 10*3/uL (ref 0.0–0.5)
Eosinophils Relative: 6 %
HCT: 31.8 % — ABNORMAL LOW (ref 36.0–46.0)
Hemoglobin: 10.6 g/dL — ABNORMAL LOW (ref 12.0–15.0)
Immature Granulocytes: 0 %
Lymphocytes Relative: 37 %
Lymphs Abs: 3.4 10*3/uL (ref 0.7–4.0)
MCH: 29.6 pg (ref 26.0–34.0)
MCHC: 33.3 g/dL (ref 30.0–36.0)
MCV: 88.8 fL (ref 80.0–100.0)
Monocytes Absolute: 0.9 10*3/uL (ref 0.1–1.0)
Monocytes Relative: 10 %
Neutro Abs: 4.2 10*3/uL (ref 1.7–7.7)
Neutrophils Relative %: 46 %
Platelets: 245 10*3/uL (ref 150–400)
RBC: 3.58 MIL/uL — ABNORMAL LOW (ref 3.87–5.11)
RDW: 13.3 % (ref 11.5–15.5)
WBC: 9.1 10*3/uL (ref 4.0–10.5)
nRBC: 0 % (ref 0.0–0.2)

## 2023-01-14 MED ORDER — NYSTATIN 100000 UNIT/ML MT SUSP: 500000.0000 [IU] | Freq: Four times a day (QID) | OROMUCOSAL | 0 refills | Status: DC

## 2023-01-14 MED ORDER — NYSTATIN 100000 UNIT/ML MT SUSP
5.0000 mL | Freq: Once | OROMUCOSAL | Status: AC
  Administered 2023-01-14: 500000 [IU] via ORAL
  Filled 2023-01-14: qty 5

## 2023-01-14 NOTE — ED Provider Notes (Signed)
[place] [facility] AT [facility]  Provider Note  CSN: [id number] Arrival date & time: 01/13/23 2204  History Chief Complaint  Patient presents with   Sore Throat    Courtney [name] is a 32 y.o. female with history of hidradenitis (on Remicade), Bipolar (on Topamax, Effexor), recent admission for Pyelo (on Bactrim) and recently started on suboxone for chronic pain reports 2 days of sore throat, worse with swallowing, loss of voice and dry cough. No fevers.    Home Medications Prior to Admission medications   Medication Sig Start Date End Date Taking? Authorizing Provider  nystatin (MYCOSTATIN) 100000 UNIT/ML suspension Take 5 mLs (500,000 Units total) by mouth 4 (four) times daily. 01/14/23  Yes Pollyann Savoy, MD  albuterol (VENTOLIN HFA) 108 (90 Base) MCG/ACT inhaler TAKE 2 PUFFS BY MOUTH EVERY 6 HOURS AS NEEDED FOR WHEEZE OR SHORTNESS OF BREATH 07/05/22   Eden Emms, NP  ARIPiprazole (ABILIFY) 2 MG tablet Take by mouth.    [provider]  Azelastine HCl 137 MCG/SPRAY SOLN     [provider]  bifidobacterium infantis (ALIGN) capsule Take 1 capsule by mouth daily.    [provider]  BIMZELX 160 MG/ML pen Inject into the skin. 05/15/22   [provider]  busPIRone (BUSPAR) 10 MG tablet TAKE 1 TABLET BY MOUTH TWICE A DAY 02/06/22   Jomarie Longs, MD  diphenhydrAMINE (BENADRYL) 25 mg capsule 25 mg every 6 (six) hours as needed.    [provider]  EPINEPHrine (EPIPEN 2-PAK) 0.3 mg/0.3 mL IJ SOAJ injection Inject 0.3 mg into the muscle as needed for anaphylaxis. 11/01/22   Eden Emms, NP  EPINEPHrine 0.3 mg/0.3 mL IJ SOAJ injection Inject 0.3 mg into the muscle as needed for anaphylaxis. 11/27/22   Charlynne Pander, MD  fluconazole (DIFLUCAN) 150 MG tablet Take 150 mg by mouth once. 05/15/22   [provider]  gabapentin (NEURONTIN) 600 MG tablet PLEASE SEE ATTACHED FOR DETAILED DIRECTIONS 10/16/22    [provider]  glucose blood (CONTOUR TEST) test strip And lancets #100 05/18/21   Romero Belling, MD  HYDROcodone-acetaminophen (HYCET) 7.5-325 mg/15 ml solution Take 5mL every six hours as needed for pain. 10/24/22   Mardella Layman, MD  hydrOXYzine (ATARAX) 25 MG tablet TAKE 1 TABLET BY MOUTH EVERY 8 HOURS AS NEEDED FOR ITCHING. 01/16/22   Worthy Rancher B, FNP  inFLIXimab (REMICADE IV) Inject 1 Dose into the vein every 30 (thirty) days.    [provider]  inFLIXimab (REMICADE) 100 MG injection     [provider]  Iron-Vitamin C 100-250 MG TABS Take 1 tablet by mouth daily.    [provider]  lamoTRIgine (LAMICTAL) 100 MG tablet TAKE 1/2 TABLET TWICE A DAY BY MOUTH 10/15/22   Jomarie Longs, MD  meloxicam (MOBIC) 7.5 MG tablet Take 7.5 mg by mouth 2 (two) times daily as needed. 09/20/22   [provider]  methylphenidate (RITALIN) 20 MG tablet Take 1 tablet (20 mg total) by mouth daily before breakfast. 01/03/23   Eden Emms, NP  Microlet Lancets MISC See admin instructions. 05/18/21   [provider]  montelukast (SINGULAIR) 10 MG tablet TAKE 1 TABLET BY MOUTH EVERYDAY AT BEDTIME 05/16/22   Eden Emms, NP  norethindrone (MICRONOR) 0.35 MG tablet Take 1 tablet by mouth daily. 07/19/21   [provider]  nystatin (MYCOSTATIN/NYSTOP) powder SMARTSIG:1 Topical Daily    [provider]  nystatin ointment (MYCOSTATIN)  Apply topically 2 (two) times daily. 10/11/22   [provider]  ofloxacin (FLOXIN) 0.3 % OTIC solution PLACE 10 DROPS INTO BOTH EARS DAILY.    [provider]  ondansetron (ZOFRAN-ODT) 4 MG disintegrating tablet Take 1 tablet (4 mg total) by mouth every 8 (eight) hours as needed for nausea or vomiting. 01/02/23   Clark, Meghan R, PA-C  oxyCODONE (ROXICODONE) 5 MG immediate release tablet Take 1 tablet (5 mg total) by mouth every 6 (six) hours as needed for severe pain. 10/25/22   White, Elita Boone,  NP  predniSONE (DELTASONE) 20 MG tablet Take 2 tablets (40 mg total) by mouth daily. 10/25/22   Valinda Hoar, NP  rizatriptan (MAXALT) 10 MG tablet See Admin Instructions. PLEASE SEE ATTACHED FOR DETAILED DIRECTIONS    [provider]  SUMAtriptan (IMITREX) 100 MG tablet Take by mouth. 10/03/22   [provider]  tobramycin-dexamethasone (TOBRADEX) ophthalmic solution 1 drop 4 (four) times daily. 08/14/22   [provider]  traMADol (ULTRAM) 50 MG tablet Take by mouth. 08/08/21   [provider]  traZODone (DESYREL) 50 MG tablet TAKE 1 TABLET BY MOUTH EVERYDAY AT BEDTIME 11/10/21   Gweneth Dimitri, MD  triamcinolone ointment (KENALOG) 0.1 % PLEASE SEE ATTACHED FOR DETAILED DIRECTIONS    [provider]  venlafaxine (EFFEXOR) 75 MG tablet INCREASE EFFEXOR TO 37.5 MG IN THE MORNING AND 75 MG AT NIGHT FOR 1 WEEK, THEN INCREASE TO 75 MG TWICE DAILY FOR HEADACHES 10/29/22   [provider]  Vitamin D, Ergocalciferol, (DRISDOL) 1.25 MG (50000 UNIT) CAPS capsule Take 1 capsule (50,000 Units total) by mouth every 7 (seven) days. 12/12/22   Eden Emms, NP     Allergies    Other, Shellfish allergy, Tapentadol, Tape, Ginger, Morphine and codeine, and Vancomycin   Review of Systems   Review of Systems Please see HPI for pertinent positives and negatives  Physical Exam BP 127/78   Pulse 88   Temp 98.6 F (37 C)   Resp 18   Ht 5\' 1"  (1.549 m)   Wt 86.2 kg   LMP 01/10/2023   SpO2 99%   BMI 35.90 kg/m   Physical Exam Vitals and nursing note reviewed.  Constitutional:      Appearance: Normal appearance.  HENT:     Head: Normocephalic and atraumatic.     Nose: Nose normal.     Mouth/Throat:     Mouth: Mucous membranes are moist. Oral lesions (appears to be oropharyngeal thrush) present.  Eyes:     Extraocular Movements: Extraocular movements intact.     Conjunctiva/sclera: Conjunctivae normal.  Cardiovascular:     Rate and Rhythm:  Normal rate.  Pulmonary:     Effort: Pulmonary effort is normal.     Breath sounds: Normal breath sounds.  Abdominal:     General: Abdomen is flat.     Palpations: Abdomen is soft.     Tenderness: There is no abdominal tenderness.  Musculoskeletal:        General: No swelling. Normal range of motion.     Cervical back: Neck supple.  Skin:    General: Skin is warm and dry.  Neurological:     General: No focal deficit present.     Mental Status: She is alert.  Psychiatric:        Mood and Affect: Mood normal.     ED Results / Procedures / Treatments   EKG None  Procedures Procedures  Medications Ordered in  the ED Medications  nystatin (MYCOSTATIN) 100000 UNIT/ML suspension 500,000 Units (has no administration in time range)    Initial Impression and Plan  Patient here with sore throat and pain with swallowing, exam is concerning for thrush, less likely strep. She is afebrile, airway is intact. Will check labs given numerous potential immune suppressants.   ED Course   Clinical Course as of 01/14/23 0335  Mon Jan 14, 2023  0258 CBC with normal WBC.  [CS]  0316 Strep is negative.  [CS]  0334 BMP is unremarkable. Plan discharge with Rx for oral nystatin, close PCP follow up, RTED for any other concerns.   [CS]    Clinical Course User Index [CS] Pollyann Savoy, MD     MDM Rules/Calculators/A&P Medical Decision Making Problems Addressed: Thrush, oral: acute illness or injury  Amount and/or Complexity of Data Reviewed Labs: ordered. Decision-making details documented in ED Course.  Risk Prescription drug management.     Final Clinical Impression(s) / ED Diagnoses Final diagnoses:  Thrush, oral    Rx / DC Orders ED Discharge Orders          Ordered    nystatin (MYCOSTATIN) 100000 UNIT/ML suspension  4 times daily        01/14/23 0335             Pollyann Savoy, MD 01/14/23 [phone]

## 2023-01-16 ENCOUNTER — Telehealth: Payer: Self-pay | Admitting: Nurse Practitioner

## 2023-01-16 ENCOUNTER — Other Ambulatory Visit: Payer: Self-pay

## 2023-01-16 ENCOUNTER — Emergency Department (HOSPITAL_BASED_OUTPATIENT_CLINIC_OR_DEPARTMENT_OTHER)
Admission: EM | Admit: 2023-01-16 | Discharge: 2023-01-16 | Disposition: A | Discharge status: Home / Self Care | Payer: BC Managed Care – PPO | Attending: Emergency Medicine | Admitting: Emergency Medicine

## 2023-01-16 ENCOUNTER — Encounter (HOSPITAL_BASED_OUTPATIENT_CLINIC_OR_DEPARTMENT_OTHER): Payer: Self-pay

## 2023-01-16 DIAGNOSIS — R112 Nausea with vomiting, unspecified: Secondary | ICD-10-CM | POA: Insufficient documentation

## 2023-01-16 DIAGNOSIS — E860 Dehydration: Secondary | ICD-10-CM | POA: Diagnosis not present

## 2023-01-16 LAB — COMPREHENSIVE METABOLIC PANEL
ALT: 15 U/L (ref 0–44)
AST: 19 U/L (ref 15–41)
Albumin: 3.4 g/dL — ABNORMAL LOW (ref 3.5–5.0)
Alkaline Phosphatase: 78 U/L (ref 38–126)
Anion gap: 7 (ref 5–15)
BUN: 7 mg/dL (ref 6–20)
CO2: 26 mmol/L (ref 22–32)
Calcium: 9.4 mg/dL (ref 8.9–10.3)
Chloride: 104 mmol/L (ref 98–111)
Creatinine, Ser: 0.79 mg/dL (ref 0.44–1.00)
GFR, Estimated: >60 mL/min (ref >60)
Glucose, Bld: 74 mg/dL (ref 70–99)
Potassium: 3.9 mmol/L (ref 3.5–5.1)
Sodium: 137 mmol/L (ref 135–145)
Total Bilirubin: 0.4 mg/dL (ref <1.2)
Total Protein: 8.4 g/dL — ABNORMAL HIGH (ref 6.5–8.1)

## 2023-01-16 LAB — CBC WITH DIFFERENTIAL/PLATELET
Abs Immature Granulocytes: 0.01 10*3/uL (ref 0.00–0.07)
Basophils Absolute: 0.1 10*3/uL (ref 0.0–0.1)
Basophils Relative: 1 %
Eosinophils Absolute: 0.6 10*3/uL — ABNORMAL HIGH (ref 0.0–0.5)
Eosinophils Relative: 7 %
HCT: 36.8 % (ref 36.0–46.0)
Hemoglobin: 12 g/dL (ref 12.0–15.0)
Immature Granulocytes: 0 %
Lymphocytes Relative: 43 %
Lymphs Abs: 3.3 10*3/uL (ref 0.7–4.0)
MCH: 29.6 pg (ref 26.0–34.0)
MCHC: 32.6 g/dL (ref 30.0–36.0)
MCV: 90.9 fL (ref 80.0–100.0)
Monocytes Absolute: 0.7 10*3/uL (ref 0.1–1.0)
Monocytes Relative: 10 %
Neutro Abs: 3 10*3/uL (ref 1.7–7.7)
Neutrophils Relative %: 39 %
Platelets: 274 10*3/uL (ref 150–400)
RBC: 4.05 MIL/uL (ref 3.87–5.11)
RDW: 13.6 % (ref 11.5–15.5)
WBC: 7.7 10*3/uL (ref 4.0–10.5)
nRBC: 0 % (ref 0.0–0.2)

## 2023-01-16 LAB — LIPASE, BLOOD: Lipase: 16 U/L (ref 11–51)

## 2023-01-16 MED ORDER — ONDANSETRON HCL 4 MG/2ML IJ SOLN
4.0000 mg | Freq: Once | INTRAMUSCULAR | Status: AC
  Administered 2023-01-16: 4 mg via INTRAVENOUS
  Filled 2023-01-16: qty 2

## 2023-01-16 MED ORDER — KETOROLAC TROMETHAMINE 30 MG/ML IJ SOLN
30.0000 mg | Freq: Once | INTRAMUSCULAR | Status: AC
  Administered 2023-01-16: 30 mg via INTRAVENOUS
  Filled 2023-01-16: qty 1

## 2023-01-16 MED ORDER — ONDANSETRON 8 MG PO TBDP: ORAL_TABLET | ORAL | 0 refills | Status: DC

## 2023-01-16 MED ORDER — SODIUM CHLORIDE 0.9 % IV BOLUS
1000.0000 mL | Freq: Once | INTRAVENOUS | Status: AC
  Administered 2023-01-16: 1000 mL via INTRAVENOUS

## 2023-01-16 NOTE — Discharge Instructions (Signed)
Begin taking Zofran as prescribed as needed for nausea.  Continue other medications as previously prescribed.  Follow-up with your primary doctor in the next few days, and return to the ER if your symptoms significantly worsen or change.

## 2023-01-16 NOTE — Telephone Encounter (Signed)
Patient's mother contacted the office regarding patient's 2 most recent ER visits. States that patient was diagnosed with thrush and was give magic mouthwash to help with this. Patient's mother believes this may have moved to patient's esophagus, and wants to know what Susy Frizzle would suggest for this? Says pt was given fluid for dehydration last night due to not being able to eat anything. Please advise patient's mom with response (okay per DPR) states pt is having some trouble talking due to what is going on.

## 2023-01-16 NOTE — ED Triage Notes (Signed)
Complaining of nausea and vomiting for a few days. Thinks she is dehydrated. Was here a couple of days ago and diagnosed with thrush which seems to be better.

## 2023-01-16 NOTE — Telephone Encounter (Signed)
This can wait for PCP response tomorrow upon his return.

## 2023-01-16 NOTE — ED Provider Notes (Signed)
[place] EMERGENCY DEPARTMENT AT [facility] Provider Note   CSN: [id number] Arrival date & time: 01/16/23  6578     History  Chief Complaint  Patient presents with   Emesis    Courtney [name] is a 32 y.o. female.  Patient is a 32 year old female with past medical history of bipolar disorder, pulmonary embolism, hydradenitis suppurativa, opiate addiction, methamphetamine addiction.  Patient presenting today with complaints of nausea and vomiting.  She was diagnosed with thrush 3 days ago and was given a mouthwash which has helped, but is now having nausea, vomiting, and decreased p.o. intake.  She denies any fevers or chills.  She denies diarrhea or constipation.  She states that she feels dehydrated.  The history is provided by the patient.       Home Medications Prior to Admission medications   Medication Sig Start Date End Date Taking? Authorizing Provider  albuterol (VENTOLIN HFA) 108 (90 Base) MCG/ACT inhaler TAKE 2 PUFFS BY MOUTH EVERY 6 HOURS AS NEEDED FOR WHEEZE OR SHORTNESS OF BREATH 07/05/22   Eden Emms, NP  ARIPiprazole (ABILIFY) 2 MG tablet Take by mouth.    [provider]  Azelastine HCl 137 MCG/SPRAY SOLN     [provider]  bifidobacterium infantis (ALIGN) capsule Take 1 capsule by mouth daily.    [provider]  BIMZELX 160 MG/ML pen Inject into the skin. 05/15/22   [provider]  busPIRone (BUSPAR) 10 MG tablet TAKE 1 TABLET BY MOUTH TWICE A DAY 02/06/22   Jomarie Longs, MD  diphenhydrAMINE (BENADRYL) 25 mg capsule 25 mg every 6 (six) hours as needed.    [provider]  EPINEPHrine (EPIPEN 2-PAK) 0.3 mg/0.3 mL IJ SOAJ injection Inject 0.3 mg into the muscle as needed for anaphylaxis. 11/01/22   Eden Emms, NP  EPINEPHrine 0.3 mg/0.3 mL IJ SOAJ injection Inject 0.3 mg into the muscle as needed for anaphylaxis. 11/27/22   Charlynne Pander, MD  fluconazole (DIFLUCAN) 150 MG tablet Take 150 mg by  mouth once. 05/15/22   [provider]  gabapentin (NEURONTIN) 600 MG tablet PLEASE SEE ATTACHED FOR DETAILED DIRECTIONS 10/16/22   [provider]  glucose blood (CONTOUR TEST) test strip And lancets #100 05/18/21   Romero Belling, MD  HYDROcodone-acetaminophen (HYCET) 7.5-325 mg/15 ml solution Take 5mL every six hours as needed for pain. 10/24/22   Mardella Layman, MD  hydrOXYzine (ATARAX) 25 MG tablet TAKE 1 TABLET BY MOUTH EVERY 8 HOURS AS NEEDED FOR ITCHING. 01/16/22   Worthy Rancher B, FNP  inFLIXimab (REMICADE IV) Inject 1 Dose into the vein every 30 (thirty) days.    [provider]  inFLIXimab (REMICADE) 100 MG injection     [provider]  Iron-Vitamin C 100-250 MG TABS Take 1 tablet by mouth daily.    [provider]  lamoTRIgine (LAMICTAL) 100 MG tablet TAKE 1/2 TABLET TWICE A DAY BY MOUTH 10/15/22   Jomarie Longs, MD  meloxicam (MOBIC) 7.5 MG tablet Take 7.5 mg by mouth 2 (two) times daily as needed. 09/20/22   [provider]  methylphenidate (RITALIN) 20 MG tablet Take 1 tablet (20 mg total) by mouth daily before breakfast. 01/03/23   Eden Emms, NP  Microlet Lancets MISC See admin instructions. 05/18/21   [provider]  montelukast (SINGULAIR) 10 MG tablet TAKE 1 TABLET BY MOUTH EVERYDAY AT BEDTIME 05/16/22   Eden Emms, NP  norethindrone (MICRONOR) 0.35 MG tablet Take 1 tablet  by mouth daily. 07/19/21   [provider]  nystatin (MYCOSTATIN) 100000 UNIT/ML suspension Take 5 mLs (500,000 Units total) by mouth 4 (four) times daily. 01/14/23   Pollyann Savoy, MD  nystatin (MYCOSTATIN/NYSTOP) powder SMARTSIG:1 Topical Daily    [provider]  nystatin ointment (MYCOSTATIN) Apply topically 2 (two) times daily. 10/11/22   [provider]  ofloxacin (FLOXIN) 0.3 % OTIC solution PLACE 10 DROPS INTO BOTH EARS DAILY.    [provider]  ondansetron (ZOFRAN-ODT) 4 MG disintegrating tablet  Take 1 tablet (4 mg total) by mouth every 8 (eight) hours as needed for nausea or vomiting. 01/02/23   Clark, Meghan R, PA-C  oxyCODONE (ROXICODONE) 5 MG immediate release tablet Take 1 tablet (5 mg total) by mouth every 6 (six) hours as needed for severe pain. 10/25/22   White, Elita Boone, NP  predniSONE (DELTASONE) 20 MG tablet Take 2 tablets (40 mg total) by mouth daily. 10/25/22   Valinda Hoar, NP  rizatriptan (MAXALT) 10 MG tablet See Admin Instructions. PLEASE SEE ATTACHED FOR DETAILED DIRECTIONS    [provider]  SUMAtriptan (IMITREX) 100 MG tablet Take by mouth. 10/03/22   [provider]  tobramycin-dexamethasone (TOBRADEX) ophthalmic solution 1 drop 4 (four) times daily. 08/14/22   [provider]  traMADol (ULTRAM) 50 MG tablet Take by mouth. 08/08/21   [provider]  traZODone (DESYREL) 50 MG tablet TAKE 1 TABLET BY MOUTH EVERYDAY AT BEDTIME 11/10/21   Gweneth Dimitri, MD  triamcinolone ointment (KENALOG) 0.1 % PLEASE SEE ATTACHED FOR DETAILED DIRECTIONS    [provider]  venlafaxine (EFFEXOR) 75 MG tablet INCREASE EFFEXOR TO 37.5 MG IN THE MORNING AND 75 MG AT NIGHT FOR 1 WEEK, THEN INCREASE TO 75 MG TWICE DAILY FOR HEADACHES 10/29/22   [provider]  Vitamin D, Ergocalciferol, (DRISDOL) 1.25 MG (50000 UNIT) CAPS capsule Take 1 capsule (50,000 Units total) by mouth every 7 (seven) days. 12/12/22   Eden Emms, NP      Allergies    Other, Shellfish allergy, Tapentadol, Tape, Ginger, Morphine and codeine, and Vancomycin    Review of Systems   Review of Systems  All other systems reviewed and are negative.   Physical Exam Updated Vital Signs BP 96/63 (BP Location: Right Arm)   Pulse 79   Temp 98.1 F (36.7 C) (Oral)   Resp 18   Ht 5\' 1"  (1.549 m)   Wt 86.2 kg   LMP 01/10/2023   SpO2 99%   BMI 35.90 kg/m  Physical Exam Vitals and nursing note reviewed.  Constitutional:      General: She is not in acute distress.     Appearance: She is well-developed. She is not diaphoretic.  HENT:     Head: Normocephalic and atraumatic.  Cardiovascular:     Rate and Rhythm: Normal rate and regular rhythm.     Heart sounds: No murmur heard.    No friction rub. No gallop.  Pulmonary:     Effort: Pulmonary effort is normal. No respiratory distress.     Breath sounds: Normal breath sounds. No wheezing.  Abdominal:     General: Bowel sounds are normal. There is no distension.     Palpations: Abdomen is soft.     Tenderness: There is no abdominal tenderness.  Musculoskeletal:        General: Normal range of motion.     Cervical back: Normal range of motion and neck supple.  Skin:  General: Skin is warm and dry.  Neurological:     General: No focal deficit present.     Mental Status: She is alert and oriented to person, place, and time.     ED Results / Procedures / Treatments   Labs (all labs ordered are listed, but only abnormal results are displayed) Labs Reviewed - No data to display  EKG None  Radiology No results found.  Procedures Procedures    Medications Ordered in ED Medications  sodium chloride 0.9 % bolus 1,000 mL (has no administration in time range)  ondansetron (ZOFRAN) injection 4 mg (has no administration in time range)  ketorolac (TORADOL) 30 MG/ML injection 30 mg (has no administration in time range)    ED Course/ Medical Decision Making/ A&P  Patient is a 32 year old female presenting with complaints of nausea and decreased p.o. intake.  She has also been vomiting.  Patient arrives here with stable vital signs and is afebrile.  Physical examination basically unremarkable.  Her abdomen is benign and nontender.  Laboratory studies obtained including CBC, CMP, and lipase, all of which are basically normal.  There is no leukocytosis, no abnormality in her liver or pancreatic enzymes, and no electrolyte derangement.  Patient was hydrated with normal saline and given Zofran for  nausea.  She appears to be feeling better.  At this point, I feel as though she can safely be discharged.  I will prescribe Zofran for nausea and have her follow-up with primary doctor as needed.  Final Clinical Impression(s) / ED Diagnoses Final diagnoses:  None    Rx / DC Orders ED Discharge Orders     None         Geoffery Lyons, MD 01/16/23 [phone]

## 2023-01-17 ENCOUNTER — Ambulatory Visit: Payer: BC Managed Care – PPO | Admitting: Nurse Practitioner

## 2023-01-17 ENCOUNTER — Encounter: Payer: Self-pay | Admitting: Nurse Practitioner

## 2023-01-17 ENCOUNTER — Encounter (HOSPITAL_COMMUNITY): Payer: Self-pay | Admitting: Emergency Medicine

## 2023-01-17 ENCOUNTER — Emergency Department (HOSPITAL_COMMUNITY): Payer: BC Managed Care – PPO

## 2023-01-17 ENCOUNTER — Emergency Department (HOSPITAL_COMMUNITY)
Admission: EM | Admit: 2023-01-17 | Discharge: 2023-01-17 | Disposition: A | Discharge status: Home / Self Care | Payer: BC Managed Care – PPO | Attending: Student | Admitting: Student

## 2023-01-17 ENCOUNTER — Other Ambulatory Visit: Payer: Self-pay

## 2023-01-17 VITALS — BP 112/70 | HR 93 | Temp 98.9°F | Ht 61.0 in | Wt 185.5 lb

## 2023-01-17 DIAGNOSIS — J029 Acute pharyngitis, unspecified: Secondary | ICD-10-CM | POA: Insufficient documentation

## 2023-01-17 DIAGNOSIS — R112 Nausea with vomiting, unspecified: Secondary | ICD-10-CM | POA: Diagnosis not present

## 2023-01-17 DIAGNOSIS — R079 Chest pain, unspecified: Secondary | ICD-10-CM | POA: Diagnosis not present

## 2023-01-17 DIAGNOSIS — R8290 Unspecified abnormal findings in urine: Secondary | ICD-10-CM | POA: Insufficient documentation

## 2023-01-17 DIAGNOSIS — F1911 Other psychoactive substance abuse, in remission: Secondary | ICD-10-CM

## 2023-01-17 DIAGNOSIS — Z1152 Encounter for screening for COVID-19: Secondary | ICD-10-CM | POA: Insufficient documentation

## 2023-01-17 DIAGNOSIS — R110 Nausea: Secondary | ICD-10-CM | POA: Diagnosis not present

## 2023-01-17 DIAGNOSIS — B370 Candidal stomatitis: Secondary | ICD-10-CM

## 2023-01-17 DIAGNOSIS — R5383 Other fatigue: Secondary | ICD-10-CM

## 2023-01-17 DIAGNOSIS — R0989 Other specified symptoms and signs involving the circulatory and respiratory systems: Secondary | ICD-10-CM | POA: Diagnosis not present

## 2023-01-17 DIAGNOSIS — M791 Myalgia, unspecified site: Secondary | ICD-10-CM | POA: Diagnosis not present

## 2023-01-17 DIAGNOSIS — Z09 Encounter for follow-up examination after completed treatment for conditions other than malignant neoplasm: Secondary | ICD-10-CM | POA: Insufficient documentation

## 2023-01-17 DIAGNOSIS — Z79899 Other long term (current) drug therapy: Secondary | ICD-10-CM | POA: Diagnosis not present

## 2023-01-17 DIAGNOSIS — E860 Dehydration: Secondary | ICD-10-CM | POA: Diagnosis not present

## 2023-01-17 DIAGNOSIS — R531 Weakness: Secondary | ICD-10-CM | POA: Diagnosis not present

## 2023-01-17 DIAGNOSIS — R609 Edema, unspecified: Secondary | ICD-10-CM | POA: Diagnosis not present

## 2023-01-17 LAB — COMPREHENSIVE METABOLIC PANEL
ALT: 18 U/L (ref 0–44)
AST: 24 U/L (ref 15–41)
Albumin: 3.1 g/dL — ABNORMAL LOW (ref 3.5–5.0)
Alkaline Phosphatase: 75 U/L (ref 38–126)
Anion gap: 6 (ref 5–15)
BUN: 6 mg/dL (ref 6–20)
CO2: 23 mmol/L (ref 22–32)
Calcium: 8.5 mg/dL — ABNORMAL LOW (ref 8.9–10.3)
Chloride: 104 mmol/L (ref 98–111)
Creatinine, Ser: 0.54 mg/dL (ref 0.44–1.00)
GFR, Estimated: >60 mL/min (ref >60)
Glucose, Bld: 87 mg/dL (ref 70–99)
Potassium: 4 mmol/L (ref 3.5–5.1)
Sodium: 133 mmol/L — ABNORMAL LOW (ref 135–145)
Total Bilirubin: 0.7 mg/dL (ref <1.2)
Total Protein: 8.3 g/dL — ABNORMAL HIGH (ref 6.5–8.1)

## 2023-01-17 LAB — URINALYSIS, ROUTINE W REFLEX MICROSCOPIC
Bilirubin Urine: NEGATIVE
Glucose, UA: NEGATIVE mg/dL
Ketones, ur: 80 mg/dL — AB
Nitrite: NEGATIVE
Protein, ur: 30 mg/dL — AB
Specific Gravity, Urine: 1.018 (ref 1.005–1.030)
pH: 5 (ref 5.0–8.0)

## 2023-01-17 LAB — CBC WITH DIFFERENTIAL/PLATELET
Abs Immature Granulocytes: 0.02 10*3/uL (ref 0.00–0.07)
Basophils Absolute: 0.1 10*3/uL (ref 0.0–0.1)
Basophils Relative: 1 %
Eosinophils Absolute: 0.5 10*3/uL (ref 0.0–0.5)
Eosinophils Relative: 6 %
HCT: 37.2 % (ref 36.0–46.0)
Hemoglobin: 12 g/dL (ref 12.0–15.0)
Immature Granulocytes: 0 %
Lymphocytes Relative: 29 %
Lymphs Abs: 2.5 10*3/uL (ref 0.7–4.0)
MCH: 29.7 pg (ref 26.0–34.0)
MCHC: 32.3 g/dL (ref 30.0–36.0)
MCV: 92.1 fL (ref 80.0–100.0)
Monocytes Absolute: 0.7 10*3/uL (ref 0.1–1.0)
Monocytes Relative: 8 %
Neutro Abs: 4.8 10*3/uL (ref 1.7–7.7)
Neutrophils Relative %: 56 %
Platelets: 248 10*3/uL (ref 150–400)
RBC: 4.04 MIL/uL (ref 3.87–5.11)
RDW: 13.2 % (ref 11.5–15.5)
WBC: 8.6 10*3/uL (ref 4.0–10.5)
nRBC: 0 % (ref 0.0–0.2)

## 2023-01-17 LAB — RESP PANEL BY RT-PCR (RSV, FLU A&B, COVID)  RVPGX2
Influenza A by PCR: NEGATIVE
Influenza B by PCR: NEGATIVE
Resp Syncytial Virus by PCR: NEGATIVE
SARS Coronavirus 2 by RT PCR: NEGATIVE

## 2023-01-17 LAB — GROUP A STREP BY PCR: Group A Strep by PCR: NOT DETECTED

## 2023-01-17 MED ORDER — NYSTATIN 100000 UNIT/ML MT SUSP
5.0000 mL | Freq: Four times a day (QID) | OROMUCOSAL | Status: DC
  Administered 2023-01-17: 500000 [IU] via OROMUCOSAL
  Filled 2023-01-17 (×3): qty 5

## 2023-01-17 MED ORDER — NYSTATIN 100000 UNIT/ML MT SUSP: 500000.0000 [IU] | Freq: Four times a day (QID) | OROMUCOSAL | 0 refills | Status: DC

## 2023-01-17 MED ORDER — HALOPERIDOL LACTATE 5 MG/ML IJ SOLN
2.0000 mg | Freq: Once | INTRAMUSCULAR | Status: AC
  Administered 2023-01-17: 2 mg via INTRAVENOUS
  Filled 2023-01-17: qty 1

## 2023-01-17 MED ORDER — DIPHENHYDRAMINE HCL 50 MG/ML IJ SOLN
12.5000 mg | Freq: Once | INTRAMUSCULAR | Status: AC
  Administered 2023-01-17: 12.5 mg via INTRAVENOUS
  Filled 2023-01-17: qty 1

## 2023-01-17 MED ORDER — ONDANSETRON HCL 4 MG PO TABS: 4.0000 mg | ORAL_TABLET | Freq: Four times a day (QID) | ORAL | 0 refills | Status: DC

## 2023-01-17 NOTE — ED Notes (Signed)
Provider Army Melia PA-C at bedside.

## 2023-01-17 NOTE — ED Provider Notes (Signed)
Care of patient received from prior provider at 10:26 AM, please see their note for complete H/P and care plan.  Received handoff per ED course.  Clinical Course as of 01/17/23 1026  Thu Jan 17, 2023  0272 Sore throat. Stable. PO challenge  [CC]  0959 Reassess. [CC]    Clinical Course User Index [CC] Glyn Ade, MD    Reassessment: Able to tolerate PO intake.   Symptoms have improved.  Medication sent.  Patient stable for OP care management at this time.   Glyn Ade, MD 01/17/23 1029

## 2023-01-17 NOTE — Progress Notes (Signed)
Established Patient Office Visit  Subjective   Patient ID: Courtney [name], female    DOB: 1990/04/23  Age: 32 y.o. MRN: [id number]  Chief Complaint  Patient presents with   Hospitalization Follow-up    HPI  ED follow-up: Patient was seen in the emergency department on 01/14/2023, 01/16/2023, 01/17/2023.  Patient was diagnosed with thrush on 01/14/2023 she was discharged with nystatin suspension.  Patient return to the emergency department on 01/16/2023 with diagnosis of nausea and vomiting.  Patient states she was improving with the oral thrush medication but was having decreased appetite and fluid intake and felt dehydrated patient was given fluids along with antiemetic and ketorolac IV.  Patient was seen today at the emergency department diagnosed with thrush .  Patient was able to tolerate p.o.'s they abstain from giving IV fluids due to the national shortage currentlyThey did run a strep test chest x-ray patient was given Benadryl and Haldol.  She is here today for follow-up.  States that she is having some hoarseness, sore throat and some chest discomfort. States that she is having some sleepiness. States that she has been having drowsiness for a week. States that she is sleeping all day and all night. States that her mother is having to wake her up to get her mouth wash. States that she stared on topamax for the psat week she was on 25mg  at bedtime and then bid. State that did make her sleepy.    Review of Systems  Constitutional:  Positive for chills and fever.  HENT:  Positive for sore throat.   Respiratory:  Positive for cough, sputum production (thick and white) and shortness of breath (doe since 3 days ag).   Psychiatric/Behavioral:  The patient does not have insomnia.       Objective:     BP 112/70   Pulse 93   Temp 98.9 F (37.2 C)   Ht 5\' 1"  (1.549 m)   Wt 185 lb 8 oz (84.1 kg)   LMP 01/10/2023   SpO2 93%   BMI 35.05 kg/m  BP Readings from Last 3 Encounters:   01/17/23 112/70  01/17/23 107/79  01/16/23 96/63   Wt Readings from Last 3 Encounters:  01/17/23 185 lb 8 oz (84.1 kg)  01/16/23 190 lb (86.2 kg)  01/14/23 190 lb (86.2 kg)   SpO2 Readings from Last 3 Encounters:  01/17/23 93%  01/17/23 96%  01/16/23 99%      Physical Exam Vitals and nursing note reviewed.  Constitutional:      Appearance: Normal appearance.  HENT:     Mouth/Throat:     Mouth: Mucous membranes are moist.     Pharynx: Posterior oropharyngeal erythema present.     Comments: White patches in mouth and oropharynx. Cardiovascular:     Rate and Rhythm: Normal rate and regular rhythm.     Heart sounds: Normal heart sounds.  Pulmonary:     Effort: Pulmonary effort is normal.     Breath sounds: Normal breath sounds.  Neurological:     Mental Status: She is alert.     Comments: A+O x4  Somnolent       Results for orders placed or performed during the hospital encounter of 01/17/23  Resp panel by RT-PCR (RSV, Flu A&B, Covid) Anterior Nasal Swab   Specimen: Anterior Nasal Swab  Result Value Ref Range   SARS Coronavirus 2 by RT PCR NEGATIVE NEGATIVE   Influenza A by PCR NEGATIVE NEGATIVE   Influenza B by  PCR NEGATIVE NEGATIVE   Resp Syncytial Virus by PCR NEGATIVE NEGATIVE  Group A Strep by PCR   Specimen: Throat; Sterile Swab  Result Value Ref Range   Group A Strep by PCR NOT DETECTED NOT DETECTED  CBC with Differential  Result Value Ref Range   WBC 8.6 4.0 - 10.5 K/uL   RBC 4.04 3.87 - 5.11 MIL/uL   Hemoglobin 12.0 12.0 - 15.0 g/dL   HCT 16.1 09.6 - 04.5 %   MCV 92.1 80.0 - 100.0 fL   MCH 29.7 26.0 - 34.0 pg   MCHC 32.3 30.0 - 36.0 g/dL   RDW 40.9 81.1 - 91.4 %   Platelets 248 150 - 400 K/uL   nRBC 0.0 0.0 - 0.2 %   Neutrophils Relative % 56 %   Neutro Abs 4.8 1.7 - 7.7 K/uL   Lymphocytes Relative 29 %   Lymphs Abs 2.5 0.7 - 4.0 K/uL   Monocytes Relative 8 %   Monocytes Absolute 0.7 0.1 - 1.0 K/uL   Eosinophils Relative 6 %   Eosinophils  Absolute 0.5 0.0 - 0.5 K/uL   Basophils Relative 1 %   Basophils Absolute 0.1 0.0 - 0.1 K/uL   Immature Granulocytes 0 %   Abs Immature Granulocytes 0.02 0.00 - 0.07 K/uL  Comprehensive metabolic panel  Result Value Ref Range   Sodium 133 (L) 135 - 145 mmol/L   Potassium 4.0 3.5 - 5.1 mmol/L   Chloride 104 98 - 111 mmol/L   CO2 23 22 - 32 mmol/L   Glucose, Bld 87 70 - 99 mg/dL   BUN 6 6 - 20 mg/dL   Creatinine, Ser 7.82 0.44 - 1.00 mg/dL   Calcium 8.5 (L) 8.9 - 10.3 mg/dL   Total Protein 8.3 (H) 6.5 - 8.1 g/dL   Albumin 3.1 (L) 3.5 - 5.0 g/dL   AST 24 15 - 41 U/L   ALT 18 0 - 44 U/L   Alkaline Phosphatase 75 38 - 126 U/L   Total Bilirubin 0.7 <1.2 mg/dL   GFR, Estimated >95 >62 mL/min   Anion gap 6 5 - 15  Urinalysis, Routine w reflex microscopic -Urine, Clean Catch  Result Value Ref Range   Color, Urine AMBER (A) YELLOW   APPearance CLOUDY (A) CLEAR   Specific Gravity, Urine 1.018 1.005 - 1.030   pH 5.0 5.0 - 8.0   Glucose, UA NEGATIVE NEGATIVE mg/dL   Hgb urine dipstick SMALL (A) NEGATIVE   Bilirubin Urine NEGATIVE NEGATIVE   Ketones, ur 80 (A) NEGATIVE mg/dL   Protein, ur 30 (A) NEGATIVE mg/dL   Nitrite NEGATIVE NEGATIVE   Leukocytes,Ua LARGE (A) NEGATIVE   RBC / HPF 6-10 0 - 5 RBC/hpf   WBC, UA 11-20 0 - 5 WBC/hpf   Bacteria, UA RARE (A) NONE SEEN   Squamous Epithelial / HPF 21-50 0 - 5 /HPF   Mucus PRESENT    Hyaline Casts, UA PRESENT       The ASCVD Risk score (Arnett DK, et al., 2019) failed to calculate for the following reasons:   The 2019 ASCVD risk score is only valid for ages 4 to 29    Assessment & Plan:   Problem List Items Addressed This Visit       Other   Lethargy    Unclear etiology did want to do a UA and UDS.  Patient recently had labs drawn this morning in the emergency department that were normal.  Offered to check an ammonia  but do not think it would have any clinical benefit so we decided against it      Polypharmacy - Primary     Referral to pharmacy team to evaluate med list and polypharmacy.      Relevant Orders   AMB Referral [facility] Care Management   Abnormal urinalysis    Recently in the hospital patient had leukocytes but no culture sent off.  As patient for repeat urine she was unable to give a sample      History of substance abuse (HCC)    History of same.  Patient currently on Suboxone.  Did try to get urine from patient she politely declined.  Was going to UDS to make sure no illicit substance were present given the amount of lethargy in office      Hospital discharge follow-up    Did review the past 3 emergency department visits and most recent labs and imaging       Return in about 9 days (around 01/26/2023) for mentation/medication reheck .    Audria Nine, NP

## 2023-01-17 NOTE — ED Triage Notes (Signed)
  Patient BIB EMS for N/V and body aches that have been going on since Sunday.  Patient states she was seen at [facility] yesterday for the same and given zofran/fluids.  Recently had UTI and pyelonephritis. Endorses aching all over.  Pain 8/10, aching.

## 2023-01-17 NOTE — ED Provider Notes (Signed)
Courtney [name] EMERGENCY DEPARTMENT AT [facility] Provider Note   CSN: [id number] Arrival date & time: 01/17/23  0343     History  Chief Complaint  Patient presents with   Emesis   Nausea    Courtney [name] is a 32 y.o. female.  32 year old female brought in by EMS with sore throat, vomiting, unable to tolerate POs despite Zofran. Recently diagnosed with thrush, unable to keep nystatin down. History of prediabetes, HS (on immunosuppressive therapy), PE (not anticoagulated). Reports history of substance abuse and is specifically not requesting pain medications.       01/02/23 presented with right flank pain. Found to have UTI, 2mm non obstructive left ureteral stone and PNA. Admitted for abx and dc on 01/03/23. Returned 01/05/23 for reasons unrelated to her admission. 01/14/23 with sore throat. Strep negative. Dc home with nystatin for strep.  01/16/23 with vomiting, decreased PO. Labs normal. IV fluids, dc.   Home Medications Prior to Admission medications   Medication Sig Start Date End Date Taking? Authorizing Provider  Buprenorphine HCl-Naloxone HCl 8-2 MG FILM Place 1 Film under the tongue 2 (two) times daily. 01/03/23 01/31/23 Yes [provider]  venlafaxine (EFFEXOR) 37.5 MG tablet Take 37.5 mg by mouth See admin instructions. START WITH 1 TABLET DAILY FOR ONE WEEK, THEN INCREASE TO 1 TABLET TWICE PER DAY FOR HEADACHES. 12/24/22  Yes [provider]  albuterol (VENTOLIN HFA) 108 (90 Base) MCG/ACT inhaler TAKE 2 PUFFS BY MOUTH EVERY 6 HOURS AS NEEDED FOR WHEEZE OR SHORTNESS OF BREATH 07/05/22   Eden Emms, NP  ARIPiprazole (ABILIFY) 2 MG tablet Take by mouth.    [provider]  Azelastine HCl 137 MCG/SPRAY SOLN     [provider]  bifidobacterium infantis (ALIGN) capsule Take 1 capsule by mouth daily.    [provider]  BIMZELX 160 MG/ML pen Inject into the skin. 05/15/22   [provider]  busPIRone  (BUSPAR) 10 MG tablet TAKE 1 TABLET BY MOUTH TWICE A DAY 02/06/22   Jomarie Longs, MD  diphenhydrAMINE (BENADRYL) 25 mg capsule 25 mg every 6 (six) hours as needed.    [provider]  EPINEPHrine (EPIPEN 2-PAK) 0.3 mg/0.3 mL IJ SOAJ injection Inject 0.3 mg into the muscle as needed for anaphylaxis. 11/01/22   Eden Emms, NP  EPINEPHrine 0.3 mg/0.3 mL IJ SOAJ injection Inject 0.3 mg into the muscle as needed for anaphylaxis. 11/27/22   Charlynne Pander, MD  fluconazole (DIFLUCAN) 150 MG tablet Take 150 mg by mouth once. 05/15/22   [provider]  gabapentin (NEURONTIN) 600 MG tablet PLEASE SEE ATTACHED FOR DETAILED DIRECTIONS 10/16/22   [provider]  glucose blood (CONTOUR TEST) test strip And lancets #100 05/18/21   Romero Belling, MD  HYDROcodone-acetaminophen (HYCET) 7.5-325 mg/15 ml solution Take 5mL every six hours as needed for pain. 10/24/22   Mardella Layman, MD  hydrOXYzine (ATARAX) 25 MG tablet TAKE 1 TABLET BY MOUTH EVERY 8 HOURS AS NEEDED FOR ITCHING. 01/16/22   Worthy Rancher B, FNP  inFLIXimab (REMICADE IV) Inject 1 Dose into the vein every 30 (thirty) days.    [provider]  inFLIXimab (REMICADE) 100 MG injection     [provider]  Iron-Vitamin C 100-250 MG TABS Take 1 tablet by mouth daily.    [provider]  lamoTRIgine (LAMICTAL) 100 MG tablet TAKE 1/2 TABLET TWICE A DAY BY MOUTH 10/15/22   Jomarie Longs, MD  meloxicam [facility])  7.5 MG tablet Take 7.5 mg by mouth 2 (two) times daily as needed. 09/20/22   [provider]  methylphenidate (RITALIN) 20 MG tablet Take 1 tablet (20 mg total) by mouth daily before breakfast. 01/03/23   Eden Emms, NP  Microlet Lancets MISC See admin instructions. 05/18/21   [provider]  mometasone (ELOCON) 0.1 % lotion Apply 1 Application topically daily.    [provider]  montelukast (SINGULAIR) 10 MG tablet TAKE 1 TABLET BY MOUTH EVERYDAY AT BEDTIME 05/16/22    Eden Emms, NP  norethindrone (MICRONOR) 0.35 MG tablet Take 1 tablet by mouth daily. 07/19/21   [provider]  nystatin (MYCOSTATIN) 100000 UNIT/ML suspension Take 5 mLs (500,000 Units total) by mouth 4 (four) times daily. 01/14/23   Pollyann Savoy, MD  nystatin (MYCOSTATIN/NYSTOP) powder SMARTSIG:1 Topical Daily    [provider]  nystatin ointment (MYCOSTATIN) Apply topically 2 (two) times daily. 10/11/22   [provider]  ofloxacin (FLOXIN) 0.3 % OTIC solution PLACE 10 DROPS INTO BOTH EARS DAILY.    [provider]  ondansetron (ZOFRAN-ODT) 8 MG disintegrating tablet 8mg  ODT q4 hours prn nausea 01/16/23   Geoffery Lyons, MD  oxyCODONE (ROXICODONE) 5 MG immediate release tablet Take 1 tablet (5 mg total) by mouth every 6 (six) hours as needed for severe pain. 10/25/22   White, Elita Boone, NP  predniSONE (DELTASONE) 20 MG tablet Take 2 tablets (40 mg total) by mouth daily. 10/25/22   Valinda Hoar, NP  rizatriptan (MAXALT) 10 MG tablet See Admin Instructions. PLEASE SEE ATTACHED FOR DETAILED DIRECTIONS    [provider]  SUMAtriptan (IMITREX) 100 MG tablet Take by mouth. 10/03/22   [provider]  tobramycin-dexamethasone (TOBRADEX) ophthalmic solution 1 drop 4 (four) times daily. 08/14/22   [provider]  traMADol (ULTRAM) 50 MG tablet Take by mouth. 08/08/21   [provider]  traZODone (DESYREL) 50 MG tablet TAKE 1 TABLET BY MOUTH EVERYDAY AT BEDTIME 11/10/21   Gweneth Dimitri, MD  triamcinolone ointment (KENALOG) 0.1 % PLEASE SEE ATTACHED FOR DETAILED DIRECTIONS    [provider]  venlafaxine (EFFEXOR) 75 MG tablet INCREASE EFFEXOR TO 37.5 MG IN THE MORNING AND 75 MG AT NIGHT FOR 1 WEEK, THEN INCREASE TO 75 MG TWICE DAILY FOR HEADACHES 10/29/22   [provider]  Vitamin D, Ergocalciferol, (DRISDOL) 1.25 MG (50000 UNIT) CAPS capsule Take 1 capsule (50,000 Units total) by mouth every 7 (seven) days.  12/12/22   Eden Emms, NP      Allergies    Other, Shellfish allergy, Tapentadol, Tape, Ginger, Morphine and codeine, and Vancomycin    Review of Systems   Review of Systems Negative except as per HPI Physical Exam Updated Vital Signs BP 97/63   Pulse 84   Temp 98.9 F (37.2 C)   Resp 18   LMP 01/10/2023   SpO2 98%  Physical Exam Vitals and nursing note reviewed.  Constitutional:      General: She is not in acute distress.    Appearance: She is well-developed. She is not diaphoretic.  HENT:     Head: Normocephalic and atraumatic.     Jaw: No trismus.     Mouth/Throat:     Mouth: Mucous membranes are dry.     Comments: Tongue red without oral lesions  Eyes:     Conjunctiva/sclera: Conjunctivae normal.  Cardiovascular:     Rate and Rhythm: Normal rate and regular rhythm.  Heart sounds: Normal heart sounds.  Pulmonary:     Effort: Pulmonary effort is normal.     Breath sounds: Normal breath sounds.  Abdominal:     Palpations: Abdomen is soft.     Tenderness: There is no abdominal tenderness.  Musculoskeletal:     Cervical back: Neck supple.  Lymphadenopathy:     Cervical: No cervical adenopathy.  Skin:    General: Skin is warm and dry.  Neurological:     Mental Status: She is alert and oriented to person, place, and time.  Psychiatric:        Behavior: Behavior normal.     ED Results / Procedures / Treatments   Labs (all labs ordered are listed, but only abnormal results are displayed) Labs Reviewed  COMPREHENSIVE METABOLIC PANEL - Abnormal; Notable for the following components:      Result Value   Sodium 133 (*)    Calcium 8.5 (*)    Total Protein 8.3 (*)    Albumin 3.1 (*)    All other components within normal limits  RESP PANEL BY RT-PCR (RSV, FLU A&B, COVID)  RVPGX2  GROUP A STREP BY PCR  CBC WITH DIFFERENTIAL/PLATELET  URINALYSIS, ROUTINE W REFLEX MICROSCOPIC    EKG None  Radiology DG Chest Port 1 View  Result Date:  01/17/2023 CLINICAL DATA:  32 year old female with history of chest pain, nausea, vomiting, weakness and body aches. EXAM: PORTABLE CHEST 1 VIEW COMPARISON:  Chest x-ray 01/02/2023. FINDINGS: Lung volumes are low. No consolidative airspace disease. No pleural effusions. No pneumothorax. No pulmonary nodule or mass noted. Pulmonary vasculature is normal. Heart size is mildly enlarged. IMPRESSION: 1. Low lung volumes without radiographic evidence of acute cardiopulmonary disease. 2. Cardiomegaly. Electronically Signed   By: Trudie Reed M.D.   On: 01/17/2023 05:28    Procedures Procedures    Medications Ordered in ED Medications  nystatin (MYCOSTATIN) 100000 UNIT/ML suspension 500,000 Units (has no administration in time range)  haloperidol lactate (HALDOL) injection 2 mg (2 mg Intravenous Given 01/17/23 0416)  diphenhydrAMINE (BENADRYL) injection 12.5 mg (12.5 mg Intravenous Given 01/17/23 0417)    ED Course/ Medical Decision Making/ A&P                                 Medical Decision Making Amount and/or Complexity of Data Reviewed Labs: ordered. Radiology: ordered.  Risk Prescription drug management.   This patient presents to the ED for concern of sore throat, vomiting, this involves an extensive number of treatment options, and is a complaint that carries with it a high risk of complications and morbidity.  The differential diagnosis includes but not limited to thrush, metabolic/electrolyte abnormality    Co morbidities that complicate the patient evaluation  As above her HPI   Additional history obtained:  Additional history obtained from EMS who contributes to history as aboveop External records from outside source obtained and reviewed including prior labx on file for comparison    Lab Tests:  I Ordered, and personally interpreted labs.  The pertinent results include:  strep negative. CBC and CMP without significant findings. COVID/flu/rsv negative    Imaging  Studies ordered:  I ordered imaging studies including CXR  I independently visualized and interpreted imaging which showed no acute process I agree with the radiologist interpretation   Problem List / ED Course / Critical interventions / Medication management  32 yo female with ongoing sore throat and vomiting.  Likely thrush. Mouth is dry, labs reassuring. Given IV fluid shortage, will try and control vomiting and PO fluid challenge. Care signed out at change of shift pending PO challenge.  I ordered medication including haldol, benadryl  for pain, vomiting  Reevaluation of the patient after these medicines showed that the patient improved I have reviewed the patients home medicines and have made adjustments as needed   Social Determinants of Health:  Has PCP   Test / Admission - Considered:  Dispo pending at time of sign out pending PO challenge          Final Clinical Impression(s) / ED Diagnoses Final diagnoses:  None    Rx / DC Orders ED Discharge Orders     None         Jeannie Fend, PA-C 01/17/23 0544    Glendora Score, MD 01/17/23 2309

## 2023-01-17 NOTE — Discharge Instructions (Signed)
Gargle and swallow your nystatin. Recheck with your PCP.

## 2023-01-17 NOTE — Assessment & Plan Note (Signed)
Recently in the hospital patient had leukocytes but no culture sent off.  As patient for repeat urine she was unable to give a sample

## 2023-01-17 NOTE — Telephone Encounter (Signed)
Looks like patient is currently in the ED

## 2023-01-17 NOTE — Assessment & Plan Note (Signed)
Unclear etiology did want to do a UA and UDS.  Patient recently had labs drawn this morning in the emergency department that were normal.  Offered to check an ammonia but do not think it would have any clinical benefit so we decided against it

## 2023-01-17 NOTE — Assessment & Plan Note (Signed)
Did review the past 3 emergency department visits and most recent labs and imaging

## 2023-01-17 NOTE — Assessment & Plan Note (Signed)
History of same.  Patient currently on Suboxone.  Did try to get urine from patient she politely declined.  Was going to UDS to make sure no illicit substance were present given the amount of lethargy in office

## 2023-01-17 NOTE — Assessment & Plan Note (Signed)
Referral to pharmacy team to evaluate med list and polypharmacy.

## 2023-01-18 ENCOUNTER — Telehealth: Payer: Self-pay

## 2023-01-18 NOTE — Progress Notes (Signed)
   Care Guide Note  01/18/2023 Name: [name] MRN: [id number] DOB: [birth year]  Referred by: Eden Emms, NP Reason for referral : Care Coordination (Outreach to schedule with Pharm d )   Courtney [name] is a 32 y.o. year old female who is a primary care patient of Eden Emms, NP. [name] was referred to the pharmacist for assistance related to Polypharmacy .    An unsuccessful telephone outreach was attempted today to contact the patient who was referred to the pharmacy team for assistance with medication management. Additional attempts will be made to contact the patient.   Penne Lash, RMA Care Guide [facility]  [place], [place] [address] Direct Dial: [phone] [email or web address]

## 2023-01-21 ENCOUNTER — Ambulatory Visit: Admit: 2023-01-21 | Discharge: 2023-01-21 | Payer: BLUE CROSS/BLUE SHIELD

## 2023-01-21 DIAGNOSIS — L732 Hidradenitis suppurativa: Principal | ICD-10-CM

## 2023-01-21 MED ADMIN — diphenhydrAMINE (BENADRYL) capsule/tablet 25 mg: 25 mg | ORAL | @ 17:00:00 | Stop: 2023-01-21

## 2023-01-21 MED ADMIN — ondansetron (ZOFRAN) injection 8 mg: 8 mg | INTRAVENOUS | @ 17:00:00 | Stop: 2023-01-21

## 2023-01-21 MED ADMIN — inFLIXimab-axxq (AVSOLA) 10 mg/kg = 800 mg in sodium chloride (NS) 250 mL IVPB: 10 mg/kg | INTRAVENOUS | @ 17:00:00 | Stop: 2023-01-21

## 2023-01-21 MED ADMIN — cetirizine (ZYRTEC) tablet 10 mg: 10 mg | ORAL | @ 17:00:00 | Stop: 2023-01-21

## 2023-01-21 MED ADMIN — acetaminophen (TYLENOL) tablet 650 mg: 650 mg | ORAL | @ 17:00:00 | Stop: 2023-01-21

## 2023-01-21 NOTE — Unmapped (Signed)
Patient presents for standard Avsola (infliximab-axxq) infusion.  In no acute distress. Vitals stable.  Reports no new medical issues or S/S of infection.  PIV placed.  See MAR for premeds.    1207  Avsola (infliximab-axxq) 800 mg infusing as follows:     50ml/hr x 15 min  48ml/hr x 15 min  26ml/hr x 15 min  31ml/hr x 15 min  175ml/hr x 30 min  235ml/hr for the remainder of the infusion.    1407  Avsola (infliximab-axxq) infusion complete.  PIV flushed with NS.  Vitals stable.  Patient without any s/s of adverse reaction. IV d/c'd.  Patient discharged from [facility]fusion [facility].

## 2023-01-22 ENCOUNTER — Ambulatory Visit: Admit: 2023-01-22 | Discharge: 2023-01-23 | Payer: BLUE CROSS/BLUE SHIELD

## 2023-01-22 ENCOUNTER — Ambulatory Visit
Admit: 2023-01-22 | Discharge: 2023-01-23 | Payer: BLUE CROSS/BLUE SHIELD | Attending: Student in an Organized Health Care Education/Training Program | Primary: Student in an Organized Health Care Education/Training Program

## 2023-01-22 DIAGNOSIS — F1190 Opioid use, unspecified, uncomplicated: Principal | ICD-10-CM

## 2023-01-22 LAB — TOXICOLOGY SCREEN, URINE
AMPHETAMINE SCREEN URINE: NEGATIVE
BARBITURATE SCREEN URINE: NEGATIVE
BENZODIAZEPINE SCREEN, URINE: NEGATIVE
BUPRENORPHINE, URINE SCREEN: POSITIVE — AB
CANNABINOID SCREEN URINE: NEGATIVE
COCAINE(METAB.)SCREEN, URINE: NEGATIVE
METHADONE SCREEN, URINE: NEGATIVE
OPIATE SCREEN URINE: NEGATIVE
OXYCODONE SCREEN URINE: NEGATIVE

## 2023-01-22 MED ORDER — GUAIFENESIN ER 600 MG TABLET, EXTENDED RELEASE 12 HR
ORAL_TABLET | Freq: Two times a day (BID) | ORAL | 0 refills | 15 days | Status: CP
Start: 2023-01-22 — End: 2023-02-21

## 2023-01-22 MED ORDER — FLUTICASONE PROPIONATE 115 MCG-SALMETEROL 21 MCG/ACTUATION HFA INHALER
Freq: Two times a day (BID) | RESPIRATORY_TRACT | 0 refills | 30 days | Status: CP
Start: 2023-01-22 — End: 2023-02-21

## 2023-01-22 NOTE — Unmapped (Signed)
 Internal Medicine at [facility]     Reason for visit: Follow up pain    Questions / Concerns that need to be addressed: cough    Screening BP- 124/65      PTHomeBP         HCDM reviewed and updated in Epic:    We are working to make sure all of our patients??? wishes are updated in Epic and part of that is documenting a Environmental health practitioner for each patient  A Health Care Decision Maker is someone you choose who can make health care decisions for you if you are not able - who would you most want to do this for you????  is already up to date.    HCDM (patient stated preference): Melissa Pearson, Melissa Pearson - Mother - [phone]    HCDM, back-up (If primary HCDM is unavailable): Melissa Pearson,Melissa Pearson - Father - [phone]    BPAs completed:  PHQ9    Annual Screenings:     __________________________________________________________________________________________    SCREENINGS COMPLETED IN FLOWSHEETS      AUDIT  AUDIT - C Score (Part 1): 1    PHQ2       PHQ9          GAD7       COPD Assessment       Falls Risk

## 2023-01-22 NOTE — Unmapped (Signed)
ASSESSMENT / PLAN:    Severe opioid use disorder  Chronic pain secondary to hydradenitis suppurativa  Severe methamphetamine use disorder        Plan  OUD with chronic pain: continue bupe-nalox 8-2mg  BID. She is attending NA meetings and is setting up care with [facility]. Pain is improved.    Meth use disorder: Continue topiramate 50mg  HS. Will try to titrate up after she stabilizes more from her UTI/pneumonia.     Severe depression/bipolar disorder: Severe depression on screening today with passive SI, no active SI or plan. Has been feeling physically unwell and thinks this is contributing to feeling down. She is on Topamax and self discontinued Abilify and Buspar prior to establishing care here. I hesitate to start an SSRI as she does describe manic episodes outside of the timeline of her meth and opioid use. Will defer to [facility] who she is rescheduling with and counseled the patient to call the clinic or report to the nearest ED with any worsening SI.    Residual cough from pneumonia: advair and guafenisin trial.    PRESCRIPTIONS GIVEN TODAY:    Medications ordered during this encounter   Medications    fluticasone propion-salmeterol (ADVAIR HFA) 115-21 mcg/actuation inhaler     Sig: Inhale 2 puffs two (2) times a day.     Dispense:  12 g     Refill:  0    guaiFENesin (MUCINEX) 600 mg 12 hr tablet     Sig: Take 2 tablets (1,200 mg total) by mouth two (2) times a day.     Dispense:  60 tablet     Refill:  0      - Benefits of treatment outweigh the risks  - Utox today: will be positive for buprenorphine  - PDMP reviewed: Appropriate on review today  - Next appt: 2 weeks  - Next PCP appointment scheduled 12/9 (Per agreement patients must see PCP at least annually or more often for non-MOUD medical management)  - Does pt have naloxone at home and know how to use it?  yes  - Labs needed today? Urine toxicology    - Buprenorphine treatment agreement:  has been signed by patient and scanned into record [facility])      SUBJECTIVE:      Diagnosed with a UTI and pneumonia soon after seeing me 2 weeks ago. Went to an OSH 4x for dehydration, N/V, inability to tolerate oral intake. Took antibiotics and then developed thrush. Now improving and able to tolerate more po including her pills and suboxone.    Our last visit was 10/29      - Pt is currently taking buprenorphine/naloxone SL film at a dose of 8mg  BID.  - Cravings? mild  - Side effects? Mild sedation  - Withdrawal symptoms?None  - Any medication left over today? yes  - Any non-prescribed substance use since last visit?  marijuana  - Attending: NA meetings      Past Medical, Family, and Social History    I have reviewed the patients problem list, current medications, allergies, and social history and updated them as needed.    REVIEW OF SYSTEMS  No nausea, constipation. Has lost weight likely due to N/V.    Medications, PMH and allergies reviewed and updated.       OBJECTIVE:     BP 124/65  - Pulse 89  - Temp 36.2 ??C (97.1 ??F) (Temporal)  - Resp 16  - Ht 154.9 cm (5' 1)  -  Wt 82.6 kg (182 lb)  - LMP 01/04/2023 (Approximate)  - SpO2 94%  - BMI 34.39 kg/m??        Physical Examination    General appearance - alert, appears tired, no distress  Mental status - alert, oriented to person, place, and time  Eyes - anicteric  Mouth - mucous membranes dry/ red and irritated tongue  Neurological - alert, oriented, normal speech      I personally spent 45 minutes face-to-face and non-face-to-face in the care of this patient, which includes all pre, intra, and post visit time on the date of service.  All documented time was specific to the E/M visit and does not include any procedures that may have been performed.

## 2023-01-22 NOTE — Unmapped (Signed)
PAIN CLINIC  Last visit date: 01/01/2023  Contract last signed: 01/01/2023  Narcan Rx: 01/01/2023  Last Utox/Opioid Confirmation: N/A (order pended)  PATIENT NEEDS TO ESTABLISH CARE WITH [facility] PCP

## 2023-01-23 LAB — OPIATE, URINE, QUANTITATIVE
6-MONOACETYLMRPH: <5 ng/mL (ref <5)
BUPRENORPHINE: 1032 ng/mL — ABNORMAL HIGH (ref <5)
CODEINE GC/MS CONF: <25 ng/mL (ref <25)
FENTANYL, URINE GC/MS: <0.5 ng/mL (ref <0.5)
HYDROCODONE GC/MS CONF: <25 ng/mL (ref <25)
HYDROMORPHONE GC/MS CONF: <25 ng/mL (ref <25)
MORPHINE GC/MS CONF: <25 ng/mL (ref <25)
NORBUPRENORPHINE: 4613 ng/mL — ABNORMAL HIGH (ref <5)
NORFENTANYL, UR GC/MS: <1 ng/mL (ref <1.0)
OPIATE INTERP: POSITIVE
OXYCODONE (GC/MS): <25 ng/mL (ref <25)
OXYMORPHONE: <25 ng/mL (ref <25)

## 2023-01-25 ENCOUNTER — Ambulatory Visit: Payer: BC Managed Care – PPO | Admitting: Nurse Practitioner

## 2023-01-26 MED ORDER — TOPIRAMATE 25 MG TABLET
ORAL_TABLET | 1 refills | 0 days
Start: 2023-01-26 — End: ?

## 2023-01-28 ENCOUNTER — Encounter: Payer: Self-pay | Admitting: Nurse Practitioner

## 2023-01-28 NOTE — Progress Notes (Signed)
   Care Guide Note  01/28/2023 Name: Courtney [name] MRN: [id number] DOB: 03/19/1990  Referred by: Eden Emms, NP Reason for referral : Care Coordination (Outreach to schedule with Pharm d )   Courtney [name] is a 32 y.o. year old female who is a primary care patient of Eden Emms, NP. Courtney [name] was referred to the pharmacist for assistance related to  polypharmacy  .    A second unsuccessful telephone outreach was attempted today to contact the patient who was referred to the pharmacy team for assistance with medication management. Additional attempts will be made to contact the patient.  Penne Lash , RMA     [facility]  [facility], [facility] Guide  Direct Dial: [phone]  Website: [email or web address]

## 2023-01-28 NOTE — Unmapped (Signed)
Patient left vm msg on Nurse Advice Line requesting return call.    Spoke with patient, she reports using Suboxone SL Film for past 3 weeks and feeling ill after each use.  Patient reports feeling nauseous and vomiting after using medication.  It started off vomiting 1-2 days per week but patient now experiencing vomiting daily, sometimes it's all at once and sometimes it's throughout the day.      Patient denies blood in emesis, denies fever.   Patient also feels dizzy at times but denies syncope or falls.    Patient is trying to stay hydrated by drinking Gatorade and tea.  Reports reduced appetite.    Patient also has been using Guaifenesin and feels it is helping with cough/mucus.  Pharmacy did not have Advair in stock, encouraged patient to call back to [facility] to see if they can order Advair for her and update patient if her respiratory symptoms are not improving.    Next PCP appointment is 02/05/23.        MD,    Patient inquired if there is another form of Suboxone you can substitute (e.g. Tablet) or if there is alternative medication you can switch her to?

## 2023-01-29 MED ORDER — ONDANSETRON 4 MG DISINTEGRATING TABLET
ORAL_TABLET | Freq: Three times a day (TID) | ORAL | 1 refills | 7 days | Status: CP | PRN
Start: 2023-01-29 — End: 2023-02-12

## 2023-01-29 MED ORDER — BUPRENORPHINE 8 MG-NALOXONE 2 MG SUBLINGUAL TABLET
ORAL_TABLET | Freq: Two times a day (BID) | SUBLINGUAL | 0 refills | 14 days | Status: CP
Start: 2023-01-29 — End: 2023-02-12

## 2023-01-29 MED ORDER — TOPIRAMATE 25 MG TABLET
ORAL_TABLET | 1 refills | 0 days | Status: CP
Start: 2023-01-29 — End: ?

## 2023-01-29 NOTE — Unmapped (Signed)
Patient has been vomiting after every dose of suboxone. Wants to try a different formulation or something different. We discussed she may be on too high of a dose and we could also try the tablet instead of film. Sending 8mg  tablet to her pharmacy and counseled to cut it in half for now to take 4mg /dose.

## 2023-01-29 NOTE — Unmapped (Signed)
Left message to call back  

## 2023-02-01 NOTE — Progress Notes (Signed)
   Care Guide Note  02/01/2023 Name: Courtney [name] MRN: [id number] DOB: 1990-04-11  Referred by: Eden Emms, NP Reason for referral : Care Coordination (Outreach to schedule with Pharm d )   ISRAEL CULL is a 32 y.o. year old female who is a primary care patient of Eden Emms, NP. [name] was referred to the pharmacist for assistance related to  polypharmacy  .    A third unsuccessful telephone outreach was attempted today to contact the patient who was referred to the pharmacy team for assistance with medication management. The Population Health team is pleased to engage with this patient at any time in the future upon receipt of referral and should he/she be interested in assistance from the [facility] team.   Penne Lash , RMA     [facility]  [facility], [facility] Guide  Direct Dial: [phone]  Website: [email or web address]

## 2023-02-04 DIAGNOSIS — L732 Hidradenitis suppurativa: Principal | ICD-10-CM

## 2023-02-05 ENCOUNTER — Ambulatory Visit
Admit: 2023-02-05 | Discharge: 2023-02-06 | Payer: BLUE CROSS/BLUE SHIELD | Attending: Student in an Organized Health Care Education/Training Program | Primary: Student in an Organized Health Care Education/Training Program

## 2023-02-05 ENCOUNTER — Ambulatory Visit: Admit: 2023-02-05 | Discharge: 2023-02-06 | Payer: BLUE CROSS/BLUE SHIELD

## 2023-02-05 DIAGNOSIS — L304 Erythema intertrigo: Principal | ICD-10-CM

## 2023-02-05 DIAGNOSIS — L408 Other psoriasis: Principal | ICD-10-CM

## 2023-02-05 DIAGNOSIS — Z79899 Other long term (current) drug therapy: Principal | ICD-10-CM

## 2023-02-05 DIAGNOSIS — L732 Hidradenitis suppurativa: Principal | ICD-10-CM

## 2023-02-05 MED ORDER — NYSTATIN 100,000 UNIT/GRAM TOPICAL OINTMENT
Freq: Two times a day (BID) | TOPICAL | 5 refills | 0 days | Status: CP
Start: 2023-02-05 — End: ?

## 2023-02-05 MED ORDER — DOXYCYCLINE HYCLATE 100 MG TABLET
ORAL_TABLET | Freq: Two times a day (BID) | ORAL | 0 refills | 30 days | Status: CP
Start: 2023-02-05 — End: ?

## 2023-02-05 MED ADMIN — triamcinolone acetonide (KENALOG-40) injection 40 mg: 40 mg | @ 20:00:00 | Stop: 2023-02-05

## 2023-02-05 NOTE — Unmapped (Unsigned)
[facility] Follow-up      Melissa Pearson    MOUD Opioid pain medication agreement last signed: 01/01/2023    Most recent Naloxone [facility]) nasal spray Rx sent: 01/01/2023    Last PDMP Review: 01/29/2023  2:11 PM    Last Opioid Dispensed Provider: Clydene Laming, MD    Recent Visits  Date Type Provider Dept   01/22/23 Last MOUD Office Visit Vien, Bradly Chris, MD [place] [facility]     Last Drug Screen Date: 01/22/2023  Urine Toxicology Screen    Lab Results   Component Value Date    AMPHU Negative 01/22/2023    BARBU Negative 01/22/2023    BENZU Negative 01/22/2023    CANNAU Negative 01/22/2023    METHU Negative 01/22/2023    COCAU Negative 01/22/2023    OPIAU Negative 01/22/2023    OXYCOU Negative 01/22/2023    BUPRENORPHIN Positive (A) 01/22/2023        Last Drug Screen Date: 01/23/2023  Opiate Confirmation Test    6-Monoacetylmorphine   Date Value Ref Range Status   01/22/2023 <5 <5 ng/mL Final     Morphine   Date Value Ref Range Status   01/22/2023 <25 <25 ng/mL Final     Codeine   Date Value Ref Range Status   01/22/2023 <25 <25 ng/mL Final     Hydrocodone   Date Value Ref Range Status   01/22/2023 <25 <25 ng/mL Final     Hydromorphone   Date Value Ref Range Status   01/22/2023 <25 <25 ng/mL Final     Oxycodone   Date Value Ref Range Status   01/22/2023 <25 <25 ng/mL Final     Oxymorphone   Date Value Ref Range Status   01/22/2023 <25 <25 ng/mL Final     Buprenorphine   Date Value Ref Range Status   01/22/2023 1,032 (H) <5 ng/mL Final     Norbuprenorphine   Date Value Ref Range Status   01/22/2023 4,613 (H) <5 ng/mL Final     Fentanyl   Date Value Ref Range Status   01/22/2023 <0.5 <0.5 ng/mL Final     Norfentanyl   Date Value Ref Range Status   01/22/2023 <1.0 <1.0 ng/mL Final       Future Appointments  Date Type Provider Dept   02/11/23 NEW PCP Appointment to Establish Care with [facility] Lorin Picket, Scot Jun, PA [facility]

## 2023-02-05 NOTE — Unmapped (Addendum)
For your HS:  I will send a referral to Dr. Orvan Falconer.    Continue doxycycline 100 mg twice daily as needed for flares. Take with food and a full glass of water.  Continue bimekizumab-bkzx 160 mg/ml injections every 8 weeks.  Continue infliximab 10 mg/kg injections every 4 weeks.  Continue nystatin powder as needed for yeast infections.

## 2023-02-05 NOTE — Unmapped (Signed)
Dermatology Note     Assessment and Plan:      Hidradenitis Suppurativa [place] Stage III, chronic, stable, on infliximab and bimekizumab  - Previously failed: Humira, Ertapenem X 12 week course completed 06/2021 (complication of PE requiring hospitalization - avoiding PICC lines in future due to this), Spironolactone (non-Derm provider discontinued due to risk of low blood pressure with other medication), augmentin (chest pain), clindamycin (C-diff), cefdinir  - Previous surgeries: unroofing left axilla 06/2021, L & R inguinal crease with Dr. Orvan Falconer 2024  - Locations: axillae (R>L), inframammary, infra-pannus, groin, inner thighs, chest  - We discussed the typical natural history, pathogenesis, treatment options, and expected course as well as the relapsing and sometimes recalcitrant nature of the disease.    - Based on discussion above and R/B/A reviewed, jointly elected to proceed with plan per below:  - Continue doxycycline 100 mg BID as needed for flares. Take with food and a full glass of water. Refilled today. Patient reports yeast infections with doxycycline use. Oral fluconazole contraindicated with current med patient reports nystatin ointment and powder provides improvement.  - Continue bimekizumab-bkzx 160 mg/ml; inject the contents of two syringes under the skin every 8 weeks as maintenance.  - Continue infliximab 10 mg/kg injections every 4 weeks.  - Continue nystatin 100,000 unit/gram ointment as needed for yeast infections. Refilled today.  - Continue following with [facility] for ongoing chronic pain management.  - Will place referral to Dr. Orvan Falconer for consideration of unroofing of  right axillae, intermammary in the future.  - Can consider prednisone in future; discussed the likelihood of prednisone worsening psoriasis flares.  - Jointly elected to proceed with ILK injections in clinic today. See procedure note below.    Intralesional Kenalog Procedure Note: After the patient was informed of risks (including atrophy and dyspigmentation), benefits and side effects of intralesional steroid injection, the patient elected to undergo injection and verbal consent was obtained. Skin was cleaned with alcohol and injected intralesionally into the sites (below). The patient tolerated the procedure well without complications and was instructed on post-procedure care.  Location(s): right axilla, chest, left axilla  Number of sites treated: 4  Kenalog (triamcinolone) Concentration: 40 mg/ml   Volume: 1 ml total     Inverse psoriasis, groin - previously diagnosed and managed by Dr. Janyth Contes  - Continue bimekizumab, infliximab as above  - Continue triamcinolone 0.1% ointment. Apply to inflamed areas of HS twice a day until improved, no longer than 2 weeks at a time. Then stop for two weeks. Restart as needed. Refilled today. Do not use until intertrigo (as below) is improved as this can worsen rash     High risk medication use - bimekizumab, infliximab  - Last Angela Burke 11/2022 negative  - Monitoring labs q4 weeks w/ infusions; 10/2022 labs notable for elevated white count (16.9). Patient aware that she is on multiple immunosuppressive agents which increase her risk for infection. Should fever, generalized fatigue, night sweats, chills or other symptoms occur, patient aware to present to ED  - Infliximab level/ antibody 8.3 07/09/2022 - acceptable to continue.    The patient was advised to call for an appointment should any new, changing, or symptomatic lesions develop.     RTC: Return in about 4 months (around 06/06/2023) for follow up of HS. or sooner as needed   _________________________________________________________________      Chief Complaint     Chief Complaint   Patient presents with    Hidradenitis suppurativa  R axillae started draining this morning, breast and groin area have flares as well        HPI     Melissa Pearson is a 32 y.o. female who presents as a returning patient (last seen by Lowry Bowl, PA-C on 11/06/2022) to Dermatology for follow up of HS and psoriasis. At last visit, patient was to continue infliximab 10 mg/kg injections, bimekizumab-bkzx 160 mg/ml injections, and nystatin 100,000 unit/gram powder for HS and inverse psoriasis.    HS  - Patient reports a flare of her right axilla and drainage of her chest. Notes a flare on her leg may be starting.  - Endorses improvement with doxycycline. Notes she has thrush right now.  - Denies currently treating with prednisone. In the past, she states the prednisone was helpful for her HS.  - Currently treating with triamcinolone, infliximab, and bimekizumab. Reports improvement with triamcinolone.  - Patient was unable to attend appointment with Dr. Orvan Falconer due to illness.  - This past October, reports a severe flare of the chest which was treating with lancing and drainage.    Inverse psoriasis  - Reports improvement with triamcinolone ointment. States she will treat as needed when flared.    Patient notes she has lost 30 lbs due to illness and notes sometimes, after infusions, she will feel nauseated and fatigued. Denies joint pain. At her last infusion, patient reports taking two antihistamines and has begun to request anti-nausea medicine.    The patient denies any other new or changing lesions or areas of concern.     Pertinent Past Medical History     No history of skin cancer    Family History:   Negative for melanoma    Past Medical History, Family History, Social History, Medication List, Allergies, and Problem List were reviewed in the rooming section of Epic.     ROS: Other than symptoms mentioned in the HPI, no fevers, chills, or other skin complaints    Physical Examination     GENERAL: Well-appearing female in no acute distress, resting comfortably.  NEURO: Alert and oriented, answers questions appropriately  PSYCH: Normal mood and affect  SKIN: Examination of the bilateral axillae, chest, medial thighs, groin was performed  - erythematous patches on bilateral medial thighs and inguinal folds  - multiple non inflamed nodules and nondraining sinus tracts of the right axilla > left, intermammary, inguinal folds medial thighs, mons pubis    All areas not commented on are within normal limits or unremarkable    Scribe's Attestation: Gentry Fitz Robynn Pane) Irven Easterly, PA-C obtained and performed the history, physical exam and medical decision making elements that were entered into the chart. Signed by Cleda Mccreedy, Scribe, on February 05, 2023 at 2:27 PM.    ----------------------------------------------------------------------------------------------------------------------  February 05, 2023 3:48 PM. Documentation assistance provided by the Scribe. I was present during the time the encounter was recorded. The information recorded by the Scribe was done at my direction and has been reviewed and validated by me.  ----------------------------------------------------------------------------------------------------------------------    (Approved Template 11/16/2019)

## 2023-02-08 NOTE — Unmapped (Signed)
Called the patient on 12/6 regarding a referral we received from their provider (Hidradenitis suppurativa) and requested to see Dr. Orvan Falconer. The patient did not answer at the time of the call, left a detailed VM for the patient to call back and schedule an appointment with our office.

## 2023-02-11 ENCOUNTER — Ambulatory Visit: Admit: 2023-02-11 | Discharge: 2023-02-12 | Payer: BLUE CROSS/BLUE SHIELD

## 2023-02-11 DIAGNOSIS — L732 Hidradenitis suppurativa: Principal | ICD-10-CM

## 2023-02-11 DIAGNOSIS — F1910 Other psychoactive substance abuse, uncomplicated: Principal | ICD-10-CM

## 2023-02-11 DIAGNOSIS — F3181 Bipolar II disorder: Principal | ICD-10-CM

## 2023-02-11 DIAGNOSIS — F901 Attention-deficit hyperactivity disorder, predominantly hyperactive type: Principal | ICD-10-CM

## 2023-02-11 DIAGNOSIS — F1190 Opioid use, unspecified, uncomplicated: Principal | ICD-10-CM

## 2023-02-11 MED ORDER — METHYLPHENIDATE 20 MG TABLET
ORAL_TABLET | Freq: Every day | ORAL | 0 refills | 30.00 days | Status: CP
Start: 2023-02-11 — End: ?

## 2023-02-11 NOTE — Unmapped (Signed)
Melissa Pearson, DMSc, PA-C    Assessment/Plan:       1.  Hidradenitis suppurativa-followed by [facility] dermatology, appreciate assistance.  Pain management follow through [facility] clinic, appreciate assistance  2.  ADHD-maintained on methylphenidate 20 mg daily, refill #30 x 0 provided.  Obtained urine toxicology screens through pain management, most recent was in 11/24.  Follow-up 3 months  3.  Vitamin D deficiency-was low at 21.65 in 10/24, currently on vitamin D 50,000 units weekly.  4.  History of substance misuse-does well with pain management and has a good relationship with them, appreciate assistance.  5.  History of nephrolithiasis-currently asymptomatic, monitor.  6.  HM-CMP, CBC in 10/24 revealed evidence of infection for CBC, otherwise unremarkable.  She was dealing with UTI and kidney stones at that time.  She obtains her OB/GYN care through [facility] in [place].  7.  Bipolar-currently on venlafaxine, not discussed in detail this visit.    F/u: 3 months    Subject/Objective:     Chief Complaint   Patient presents with    Establish Care     New attending      S: 32 y.o. year old female with PMHx significant for   Past Medical History:   Diagnosis Date    Acne     ADHD     Allergic     Anxiety disorder 06/28/2021    Last Assessment & Plan:    Formatting of this note might be different from the original.   Currently followed by psychiatry does have hydroxyzine and BuSpar.  Continue following with psychiatry as recommended and take medications as prescribed    Attention deficit hyperactivity disorder (ADHD), predominantly hyperactive type 02/11/2012    Last Assessment & Plan:    Formatting of this note might be different from the original.   Patient currently maintained on methylphenidate 20 mg daily.  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    Bipolar 1 disorder (CMS-HCC)     Bipolar 2 disorder, major depressive episode (CMS-HCC) 08/04/2019    Last Assessment & Plan:    Formatting of this note might be different from the original.   Patient is followed by Dr. Laurence Spates currently maintained on Lamictal and Abilify    Depression     Diabetes mellitus (CMS-HCC) 2022    Pre diabetic    Difficulty sleeping     Disorder of skin or subcutaneous tissue 2018    HS    Flu     had tamiflu    Headache, tension-type     Hidradenitis suppurativa 12/18/2017    History of opioid abuse (CMS-HCC)     Hydradenitis     Keloid     Right ear    Kidney stones     Polysubstance abuse (CMS-HCC)     Pulmonary embolism (CMS-HCC) 07/05/2021    Last Assessment & Plan:    Formatting of this note might be different from the original.   Patient has been evaluated by oncology and has completed Eliquis 5 mg twice daily for 6 months.  07/2021 [facility]   .     Patient Active Problem List   Diagnosis    Hidradenitis suppurativa    Attention deficit hyperactivity disorder (ADHD), predominantly hyperactive type    Bipolar 2 disorder, major depressive episode (CMS-HCC)    Anxiety disorder    Anemia  Acute metabolic encephalopathy    Prediabetes    Obesity (BMI 35.0-39.9 without comorbidity)    Insomnia    Pulmonary embolism (CMS-HCC)    Vitamin D deficiency    Opioid use disorder    Stimulant use disorder    Other chronic pain       Melissa Pearson is a 32 year old with history of hidradenitis suppurativa followed by [facility] dermatology, ADHD, bipolar, hearing loss with left greater than right, history of kidney stones, vitamin D deficiency, substance misuse, migraine who presents to establish care.  Pain management and the Suboxone is managed through our [facility] clinic    Family history: Mother with hypertension, diabetes, thyroid disorder unsure if hyper or hypo-.  Father was a smoker had an MI at age 55.  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       Your Medication List            Accurate as of February 11, 2023  2:18 PM. If you have any questions, ask your nurse or doctor.                CONTINUE taking these medications      albuterol 90 mcg/actuation inhaler  Commonly known as: PROVENTIL HFA;VENTOLIN HFA  ProAir HFA 90 mcg/actuation aerosol inhaler     ALIGN ORAL  Take by mouth.     azelastine 137 mcg (0.1 %) nasal spray  Commonly known as: ASTELIN  PLACE 1 SPRAY INTO BOTH NOSTRILS 2 (TWO) TIMES DAILY. USE IN EACH NOSTRIL AS DIRECTED     BIMZELX AUTOINJECTOR 160 mg/mL Atin  Generic drug: bimekizumab-bkzx  Inject the contents of 2 syringes (320 mg total)  every 8 weeks.     BIMZELX AUTOINJECTOR 160 mg/mL Atin  Generic drug: bimekizumab-bkzx  Inject the contents of 2 syringes (320 mg total)  under the skin every twenty-eight (28) days.     buprenorphine-naloxone 8-2 mg sublingual tablet  Commonly known as: SUBOXONE  Place 1 tablet (8 mg of buprenorphine total) under the tongue two (2) times a day for 14 days.     diphenhydrAMINE 25 mg capsule/tablet  Commonly known as: BENADRYL  Take 1 each (25 mg total) by mouth every six (6) hours as needed for itching.     doxycycline 100 MG tablet  Commonly known as: VIBRA-TABS  Take 1 tablet (100 mg total) by mouth two (2) times a day. For 7-10 days as needed for HS flares     empty container Misc  Use as directed to dispose of Cosentyx pens.     EPINEPHrine 0.3 mg/0.3 mL injection  Commonly known as: EPIPEN  INJECTAS DIRECTED AS NEEDED FOR SYSTEMIC REACTIONS     ergocalciferol-1,250 mcg (50,000 unit) 1,250 mcg (50,000 unit) capsule  Commonly known as: DRISDOL  Take 1 capsule (1,250 mcg total) by mouth once a week.     erythromycin 5 mg/gram (0.5 %) ophthalmic ointment  Commonly known as: ROMYCIN     fluticasone propion-salmeterol 115-21 mcg/actuation inhaler  Commonly known as: ADVAIR HFA  Inhale 2 puffs two (2) times a day.     guaiFENesin 600 mg 12 hr tablet  Commonly known as: MUCINEX  Take 2 tablets (1,200 mg total) by mouth two (2) times a day.     hydrOXYzine 25 MG tablet  Commonly known as: ATARAX  Take 1 tablet (25 mg total) by  mouth every eight (8) hours as needed for itching, allergies or anxiety.     iron,carbonyl-vitamin C 100-250 mg Tab  Take 1 tablet by mouth daily.     meloxicam 7.5 MG tablet  Commonly known as: MOBIC  Take 1 tablet (7.5 mg total) by mouth two (2) times a day as needed for pain. Take with food.     methylphenidate HCl 20 MG tablet  Commonly known as: RITALIN  Take 1 tablet (20 mg total) by mouth daily.     mometasone 0.1 % solution  Commonly known as: ELOCON  Apply topically daily.     montelukast 10 mg tablet  Commonly known as: SINGULAIR  TAKE 1 TABLET AT BEDTIME     naloxone 4 mg/actuation nasal spray  Commonly known as: NARCAN  One spray in either nostril once for known/suspected opioid overdose. May repeat every 2-3 minutes in alternating nostril til EMS arrives     norethindrone 0.35 mg tablet  Commonly known as: MICRONOR  Take 1 tablet by mouth daily.     nystatin 100,000 unit/gram powder  Commonly known as: MYCOSTATIN  Apply topically.     nystatin 100,000 unit/gram ointment  Commonly known as: MYCOSTATIN  Apply topically two (2) times a day. To yeast rash     ondansetron 4 MG disintegrating tablet  Commonly known as: ZOFRAN-ODT  Take 1 tablet (4 mg total) by mouth every eight (8) hours as needed for nausea for up to 14 days.     ondansetron 4 MG tablet  Commonly known as: ZOFRAN  Take 1 tablet (4 mg total) by mouth.     REMICADE IV  Remicade     sumatriptan 100 MG tablet  Commonly known as: IMITREX  Take 1 tablet (100 mg total) by mouth.     topiramate 25 MG tablet  Commonly known as: Topamax  25 MG NIGHTLY X 7 DAYS THEN 25 MG TWICE DAILY     venlafaxine 37.5 MG tablet  Commonly known as: EFFEXOR  Take 1 tablet (37.5 mg total) by mouth two (2) times a day.              Allergies   Allergen Reactions    Adhesive Tape-Silicones Hives     Pt can only tolerate cloth tape    Ginger Anaphylaxis Other Hives     ABD pad causes rash  Pt can only tolerate cloth tape  Pt can only tolerate cloth tape    Amoxicillin-Pot Clavulanate Headache    Opioids - Morphine Analogues       I become very angry    Sulfamethoxazole-Trimethoprim Other (See Comments)     Causes Oral Thrush    Iodine Nausea Only    Vancomycin Other (See Comments)     Burns her vein really bad and always causes them to blow.        Medication adherence and barriers to the treatment plan have been addressed. Opportunities to optimize healthy behaviors have been discussed. Patient / caregiver voiced understanding.      ROS: negative for vision changes, CP, abdominal pain, bowel/bladder changes, bleeding, lower extremity edema    O:  Vital Signs:    Vitals:    02/11/23 1352   BP: 102/58   Pulse: 77   Temp: 37.2 ??C (99 ??F)   TempSrc: Temporal   SpO2: 94%   Weight: 80.1 kg (176 lb 9.6 oz)   Height: 154.9 cm (5' 1)       Physical Exam  General Appearance:    Alert, cooperative, no distress, appears stated age   Head:    Normocephalic, without obvious abnormality, atraumatic   Eyes:     Ears:     Throat:   Lips, mucosa, and tongue normal   Neck:   Supple, symmetrical, trachea midline,     No JVD bilaterally.   Lungs:     Clear to auscultation bilaterally, respirations unlabored   Chest Wall:    No tenderness or deformity    Heart:    Regular rate and rhythm, S1 and S2 normal, no murmur, rub    or gallop   Abdomen:     Soft, non-tender       Extremities:   Extremities normal, atraumatic, no cyanosis or edema   Psych :  No agitation, anxiety, or depressed mood.      Note - This record has been created using [employer]. Chart creation errors have been sought, but may not always have been located. Such creation errors do not reflect on the standard of medical care.

## 2023-02-11 NOTE — Unmapped (Signed)
[facility] Internal Medicine at [facility]     Reason for visit: Establish Care    Questions / Concerns that need to be addressed: new attending, possibly switch ritalin to different med    Screening BP- 102/58   77          PTHomeBP         HCDM reviewed and updated in Epic:    We are working to make sure all of our patients??? wishes are updated in Epic and part of that is documenting a Environmental health practitioner for each patient  A Health Care Decision Maker is someone you choose who can make health care decisions for you if you are not able - who would you most want to do this for you????  is already up to date.    HCDM (patient stated preference): AVIN, CHANLEY - Mother - [phone]    HCDM, back-up (If primary HCDM is unavailable): Manolis,Marty - Father - [phone]    BPAs completed:      Annual Screenings:     __________________________________________________________________________________________    SCREENINGS COMPLETED IN FLOWSHEETS      AUDIT       PHQ2       PHQ9          GAD7       COPD Assessment       Falls Risk

## 2023-02-18 ENCOUNTER — Ambulatory Visit: Admit: 2023-02-18 | Discharge: 2023-02-18 | Payer: BLUE CROSS/BLUE SHIELD

## 2023-02-18 DIAGNOSIS — L732 Hidradenitis suppurativa: Principal | ICD-10-CM

## 2023-02-18 LAB — BUN: BLOOD UREA NITROGEN: 7 mg/dL — ABNORMAL LOW (ref 9–23)

## 2023-02-18 LAB — CBC W/ AUTO DIFF
BASOPHILS ABSOLUTE COUNT: 0.1 10*9/L (ref 0.0–0.1)
BASOPHILS RELATIVE PERCENT: 1.1 %
EOSINOPHILS ABSOLUTE COUNT: 0.5 10*9/L (ref 0.0–0.5)
EOSINOPHILS RELATIVE PERCENT: 4.8 %
HEMATOCRIT: 34.8 % (ref 34.0–44.0)
HEMOGLOBIN: 11.6 g/dL (ref 11.3–14.9)
LYMPHOCYTES ABSOLUTE COUNT: 4.3 10*9/L — ABNORMAL HIGH (ref 1.1–3.6)
LYMPHOCYTES RELATIVE PERCENT: 43.4 %
MEAN CORPUSCULAR HEMOGLOBIN CONC: 33.2 g/dL (ref 32.0–36.0)
MEAN CORPUSCULAR HEMOGLOBIN: 28.7 pg (ref 25.9–32.4)
MEAN CORPUSCULAR VOLUME: 86.5 fL (ref 77.6–95.7)
MEAN PLATELET VOLUME: 8.3 fL (ref 6.8–10.7)
MONOCYTES ABSOLUTE COUNT: 1.1 10*9/L — ABNORMAL HIGH (ref 0.3–0.8)
MONOCYTES RELATIVE PERCENT: 10.7 %
NEUTROPHILS ABSOLUTE COUNT: 3.9 10*9/L (ref 1.8–7.8)
NEUTROPHILS RELATIVE PERCENT: 40 %
PLATELET COUNT: 266 10*9/L (ref 150–450)
RED BLOOD CELL COUNT: 4.03 10*12/L (ref 3.95–5.13)
RED CELL DISTRIBUTION WIDTH: 13.3 % (ref 12.2–15.2)
WBC ADJUSTED: 9.8 10*9/L (ref 3.6–11.2)

## 2023-02-18 LAB — AST: AST (SGOT): 19 U/L (ref <=34)

## 2023-02-18 LAB — ALT: ALT (SGPT): 13 U/L (ref 10–49)

## 2023-02-18 LAB — C-REACTIVE PROTEIN: C-REACTIVE PROTEIN: 23 mg/L — ABNORMAL HIGH (ref <=10.0)

## 2023-02-18 LAB — CREATININE
CREATININE: 0.62 mg/dL (ref 0.55–1.02)
EGFR CKD-EPI (2021) FEMALE: >90 mL/min/{1.73_m2} (ref >=60)

## 2023-02-18 LAB — SEDIMENTATION RATE: ERYTHROCYTE SEDIMENTATION RATE: >130 mm/h — ABNORMAL HIGH (ref 0–20)

## 2023-02-18 MED ADMIN — acetaminophen (TYLENOL) tablet 650 mg: 650 mg | ORAL | @ 15:00:00 | Stop: 2023-02-18

## 2023-02-18 MED ADMIN — cetirizine (ZYRTEC) tablet 10 mg: 10 mg | ORAL | @ 15:00:00 | Stop: 2023-02-18

## 2023-02-18 MED ADMIN — ondansetron (ZOFRAN) injection 8 mg: 8 mg | INTRAVENOUS | @ 15:00:00 | Stop: 2023-02-18

## 2023-02-18 MED ADMIN — inFLIXimab-axxq (AVSOLA) 10 mg/kg = 800 mg in sodium chloride (NS) 250 mL IVPB: 10 mg/kg | INTRAVENOUS | @ 15:00:00 | Stop: 2023-02-18

## 2023-02-18 MED ADMIN — diphenhydrAMINE (BENADRYL) capsule/tablet 25 mg: 25 mg | ORAL | @ 15:00:00 | Stop: 2023-02-18

## 2023-02-18 NOTE — Unmapped (Signed)
Pt in infusion center today for Q4 weeks Infliximab - axxq (Avsola) 10 mg/kg dose infusion. Alert, oriented, NAD. Reports no new medical issues/allergies or S/S infection.   Pre-meds given per orders.   #22 PIV placed to LAC, labs collected per orders.   @1023 : Infliximab -axxq (Avsola) 10 mg/kg dose infusion initiated to run at following rates:   10 ml/hr x 15 minutes  20 ml/hr x 15 minutes  40 ml/hr x 15 minutes  80 ml/hr x 15 minutes  150 ml/hr x 30 minutes  250 ml/hr x for the remainder of infusion.   @1224 : infusion completed without complications. Pt remains VSS, afebrile.   Line care provided with positive blood return; PIV flushed, discontinued.   Pt discharged from clinic in NAD, in stable condition.

## 2023-02-19 ENCOUNTER — Telehealth: Admit: 2023-02-19 | Discharge: 2023-02-20 | Payer: BLUE CROSS/BLUE SHIELD

## 2023-02-19 DIAGNOSIS — G8929 Other chronic pain: Principal | ICD-10-CM

## 2023-02-19 DIAGNOSIS — F1520 Other stimulant dependence, uncomplicated: Principal | ICD-10-CM

## 2023-02-19 DIAGNOSIS — F1190 Opioid use, unspecified, uncomplicated: Principal | ICD-10-CM

## 2023-02-19 MED ORDER — BUPRENORPHINE 8 MG-NALOXONE 2 MG SUBLINGUAL TABLET
ORAL_TABLET | Freq: Two times a day (BID) | SUBLINGUAL | 1 refills | 30.00 days | Status: CP
Start: 2023-02-19 — End: 2023-06-19

## 2023-02-19 MED ORDER — QVAR REDIHALER 40 MCG/ACTUATION HFA BREATH ACTIVATED AEROSOL
0 refills | 0.00 days
Start: 2023-02-19 — End: ?

## 2023-02-19 MED ORDER — FLUTICASONE PROPIONATE 110 MCG/ACTUATION HFA AEROSOL INHALER
Freq: Two times a day (BID) | RESPIRATORY_TRACT | 0 refills | 30.00 days | Status: CP
Start: 2023-02-19 — End: 2023-03-21

## 2023-02-19 NOTE — Unmapped (Signed)
 [facility] Follow-up      Melissa Pearson    MOUD Opioid pain medication agreement last signed: 01/01/2023    Most recent Naloxone [facility]) nasal spray Rx sent: 01/01/2023    Last PDMP Review: 01/29/2023  2:11 PM    Last Opioid Dispensed Provider: Clydene Laming, MD    Recent Visits  Date Type Provider Dept   01/22/23 Last MOUD Office Visit Vien, Bradly Chris, MD [place] [facility]     Last Drug Screen Date: 01/22/2023  Urine Toxicology Screen    Lab Results   Component Value Date    AMPHU Negative 01/22/2023    BARBU Negative 01/22/2023    BENZU Negative 01/22/2023    CANNAU Negative 01/22/2023    METHU Negative 01/22/2023    COCAU Negative 01/22/2023    OPIAU Negative 01/22/2023    OXYCOU Negative 01/22/2023    BUPRENORPHIN Positive (A) 01/22/2023        Last Drug Screen Date: 01/23/2023  Opiate Confirmation Test    6-Monoacetylmorphine   Date Value Ref Range Status   01/22/2023 <5 <5 ng/mL Final     Morphine   Date Value Ref Range Status   01/22/2023 <25 <25 ng/mL Final     Codeine   Date Value Ref Range Status   01/22/2023 <25 <25 ng/mL Final     Hydrocodone   Date Value Ref Range Status   01/22/2023 <25 <25 ng/mL Final     Hydromorphone   Date Value Ref Range Status   01/22/2023 <25 <25 ng/mL Final     Oxycodone   Date Value Ref Range Status   01/22/2023 <25 <25 ng/mL Final     Oxymorphone   Date Value Ref Range Status   01/22/2023 <25 <25 ng/mL Final     Buprenorphine   Date Value Ref Range Status   01/22/2023 1,032 (H) <5 ng/mL Final     Norbuprenorphine   Date Value Ref Range Status   01/22/2023 4,613 (H) <5 ng/mL Final     Fentanyl   Date Value Ref Range Status   01/22/2023 <0.5 <0.5 ng/mL Final     Norfentanyl   Date Value Ref Range Status   01/22/2023 <1.0 <1.0 ng/mL Final       Future Appointments  Date Type Provider Dept   02/11/23 NEW PCP Appointment to Establish Care with [facility] Lorin Picket, Scot Jun, PA [facility]

## 2023-02-19 NOTE — Unmapped (Signed)
ASSESSMENT / PLAN:    OUD with chronic pain: continue bupe-nalox 8-2mg  TABLETS BID. She is attending NA meetings and is setting up care with [facility]. Pain is improved. Had N/V with suboxone films but is doing well with the tablets.     Methamphetamine use disorder: Continue topiramate 12.5-25mg  HS. This is pretty sedating for her and don't think there is much room to titrate up for now.     Severe depression/bipolar disorder: Mood has been better lately although still depressed especially since her grandmother was admitted to the hospital earlier this month. No SI. Feels she's in a stable place overall.     Residual cough from pneumonia: advair not covered, will send fluticasone.      Plan  Patient IMCMATSTABILITY: is stable on maintenance therapy [email or web address]: 1 month follow up, next visit virtual    PRESCRIPTIONS GIVEN TODAY:    Medications ordered during this encounter   Medications    buprenorphine-naloxone (SUBOXONE) 8-2 mg sublingual tablet     Sig: Place 1 tablet (8 mg of buprenorphine total) under the tongue two (2) times a day.     Dispense:  60 tablet     Refill:  1    fluticasone propionate (FLOVENT HFA) 110 mcg/actuation inhaler     Sig: Inhale 2 puffs two (2) times a day.     Dispense:  12 g     Refill:  0      - Benefits of treatment outweigh the risks  - Utox today: will be positive for: amphetamines (on methylphenidate), buprenorphine  - PDMP reviewed: Appropriate on review today  - Next appt: 4 weeks MUST BE SCHEDULED BEFORE PATIENT LEAVES  - Does pt have naloxone at home and know how to use it?  yes  - Labs needed today? no    - Buprenorphine treatment agreement:  has been signed by patient and scanned into record [facility])      SUBJECTIVE:      History of Present Illness    Ms. Woodhull is a 32 y.o. female who presents for OUD follow up:     Our last visit was 11/19.    Her cravings are controlled and her chronic pain is bearable on suboxone. Recently started a job at [employer] and is enjoying that. Topamax for meth use disorder helps her sleep a lot.    - Pt is currently taking buprenorphine/naloxone SL tab at a dose of 8mg  BID.  - Cravings? none  - Side effects? none  - Withdrawal symptoms?None  - Any medication left over today? yes  - Any non-prescribed substance use since last visit?  none  - Attending: NA meetings      Past Medical, Family, and Social History    I have reviewed the patients problem list, current medications, allergies, and social history and updated them as needed.    REVIEW OF SYSTEMS  No nausea, constipation, weight changes    Medications, PMH and allergies reviewed and updated.       OBJECTIVE:     LMP 01/04/2023 (Approximate)    Video visit today, no exam done.      I personally spent 35 minutes face-to-face and non-face-to-face in the care of this patient, which includes all pre, intra, and post visit time on the date of service.  All documented time was specific to the E/M visit and does not include any procedures that may have been performed.

## 2023-02-20 NOTE — Unmapped (Signed)
[facility] Refill Coordination Note    Melissa Pearson, [employer]: 09-25-1990  Phone: [phone] (home) [phone] (work)      All above HIPAA information was verified with patient.         02/19/2023     1:30 PM   Specialty Rx Medication Refill Questionnaire   Which Medications would you like refilled and shipped? Bimzelx   Please list all current allergies: Morphine Bactrium   Have you missed any doses in the last 30 days? No   Have you had any changes to your medication(s) since your last refill? No   How many days remaining of each medication do you have at home? 0   If receiving an injectable medication, next injection date is 02/22/2023   Have you experienced any side effects in the last 30 days? No   Please enter the full address (street address, city, state, zip code) where you would like your medication(s) to be delivered to. [address] , [place] [address]   Please specify on which day you would like your medication(s) to arrive. Note: if you need your medication(s) within 3 days, please call the pharmacy to schedule your order at [phone]  02/22/2023   Has your insurance changed since your last refill? No   Would you like a pharmacist to call you to discuss your medication(s)? No   Do you require a signature for your package? (Note: if we are billing Medicare Part B or your order contains a controlled substance, we will require a signature) No         Completed refill call assessment today to schedule patient's medication shipment from the [facility] [phone]).  All relevant notes have been reviewed.       Confirmed patient received a Conservation officer, historic buildings and a Surveyor, mining with first shipment. The patient will receive a drug information handout for each medication shipped and additional FDA Medication Guides as required.         REFERRAL TO PHARMACIST     Referral to the pharmacist: Not needed      [facility] Shipping address confirmed in Epic.     Delivery Scheduled: Yes, Expected medication delivery date: 12/20.     Medication will be delivered via UPS to the prescription address in Epic WAM.    Melissa Pearson Special[facility] and [facility]

## 2023-02-21 MED FILL — BIMZELX AUTOINJECTOR 160 MG/ML SUBCUTANEOUS AUTO-INJECTOR: 56 days supply | Qty: 2 | Fill #1

## 2023-02-22 MED ORDER — BECLOMETHASONE DIPROP 40 MCG/ACTUATION HFA BREATH ACTIVATED AEROSOL
Freq: Two times a day (BID) | RESPIRATORY_TRACT | 0 refills | 30.00 days | Status: CP
Start: 2023-02-22 — End: 2023-03-24

## 2023-02-22 NOTE — Unmapped (Signed)
Sent beclomethasone inhaler Rx today.

## 2023-02-24 ENCOUNTER — Ambulatory Visit
Admission: EM | Admit: 2023-02-24 | Discharge: 2023-02-24 | Disposition: A | Discharge status: Home / Self Care | Payer: BC Managed Care – PPO | Attending: Nurse Practitioner | Admitting: Nurse Practitioner

## 2023-02-24 ENCOUNTER — Encounter: Payer: Self-pay | Admitting: Emergency Medicine

## 2023-02-24 DIAGNOSIS — R112 Nausea with vomiting, unspecified: Secondary | ICD-10-CM | POA: Insufficient documentation

## 2023-02-24 DIAGNOSIS — R1013 Epigastric pain: Secondary | ICD-10-CM | POA: Diagnosis not present

## 2023-02-24 LAB — POCT URINALYSIS DIP (MANUAL ENTRY)
Bilirubin, UA: NEGATIVE
Blood, UA: NEGATIVE
Glucose, UA: NEGATIVE mg/dL
Ketones, POC UA: NEGATIVE mg/dL
Nitrite, UA: NEGATIVE
Spec Grav, UA: >=1.03 — AB (ref 1.010–1.025)
Urobilinogen, UA: 0.2 U/dL
pH, UA: 6.5 (ref 5.0–8.0)

## 2023-02-24 LAB — POCT URINE PREGNANCY: Preg Test, Ur: NEGATIVE

## 2023-02-24 MED ORDER — ONDANSETRON 4 MG PO TBDP
4.0000 mg | ORAL_TABLET | Freq: Once | ORAL | Status: AC
  Administered 2023-02-24: 4 mg via ORAL

## 2023-02-24 MED ORDER — LIDOCAINE VISCOUS HCL 2 % MT SOLN
15.0000 mL | Freq: Once | OROMUCOSAL | Status: AC
Start: 2023-02-24 — End: 2023-02-24
  Administered 2023-02-24: 15 mL via OROMUCOSAL

## 2023-02-24 MED ORDER — ALUM & MAG HYDROXIDE-SIMETH 200-200-20 MG/5ML PO SUSP
30.0000 mL | Freq: Once | ORAL | Status: AC
  Administered 2023-02-24: 30 mL via ORAL

## 2023-02-24 MED ORDER — MYLANTA MAXIMUM STRENGTH 400-400-40 MG/5ML PO SUSP: 15.0000 mL | Freq: Four times a day (QID) | ORAL | 0 refills | Status: DC | PRN

## 2023-02-24 MED ORDER — ONDANSETRON 4 MG PO TBDP: 4.0000 mg | ORAL_TABLET | Freq: Three times a day (TID) | ORAL | 0 refills | Status: DC | PRN

## 2023-02-24 NOTE — ED Provider Notes (Signed)
[facility]    CSN: [id number] Arrival date & time: 02/24/23  1039      History   Chief Complaint Chief Complaint  Patient presents with   Nausea   Emesis    HPI Courtney [name] is a 32 y.o. female.   The history is provided by the patient.   Patient presents for complaints of nausea and vomiting that started over the past 24 hours.  Patient states when symptoms started, she vomited approximately 6 times, last time of vomiting was around 2 AM this morning.  States for dinner last evening, she did eat a salad.  She denies fever, states that she does have chills, further denies headache, gas, bloating, bloody stools, diarrhea, constipation, or urinary symptoms.  She also complains of pain in the epigastric region.  She denies prior history of GERD.  Patient is also requesting a note for work.  States that she did take Zofran around 6 AM this morning, continues to feel nauseated.  Past Medical History:  Diagnosis Date   ADHD (attention deficit hyperactivity disorder)    Bipolar disorder (HCC)    Chronic tonsillitis 08/2013   current strep, will finish antibiotic 09/04/2013; snores during sleep, mother denies apnea   Heart murmur    "when I was born; outgrew it; I was a preemie"   Hidradenitis    Hidradenitis 2017   Migraine 12/19/2016   "I've just had this one for 3 days" (12/20/2016)   Pulmonary emboli (HCC) 07/02/2021   BL   Septic shock (HCC) 07/02/2021    Patient Active Problem List   Diagnosis Date Noted   Polypharmacy 01/17/2023   Abnormal urinalysis 01/17/2023   History of substance abuse (HCC) 01/17/2023   Hospital discharge follow-up 01/17/2023   Acute cystitis 01/02/2023   Left ureteral stone 01/02/2023   CAP (community acquired pneumonia) 01/02/2023   Polysubstance abuse (HCC) 01/02/2023   Methamphetamine use (HCC) 11/01/2022   Otitis externa of both ears 05/16/2022   Chronic rhinitis 02/15/2022   Shellfish allergy 02/15/2022   Pulmonary  embolism (HCC) 07/19/2021   Hypotension 07/11/2021   Trichomonas vaginitis 07/05/2021   Acute metabolic encephalopathy 07/05/2021   Bilateral pulmonary embolism (HCC) 07/05/2021   Anxiety disorder 06/28/2021   Prediabetes 05/25/2021   Allergy to adhesive tape 05/25/2021   Hidradenitis suppurativa 05/23/2021   Insomnia 03/27/2021   Anemia 12/21/2020   Hypoglycemia 11/17/2020   Bipolar disorder, in full remission, most recent episode mixed (HCC) 10/07/2020   Bipolar disorder, in full remission, most recent episode depressed (HCC) 03/03/2020   Bipolar 2 disorder, major depressive episode (HCC) 08/04/2019   At risk for long QT syndrome 08/04/2019   Obesity (BMI 35.0-39.9 without comorbidity) 03/19/2019   Reactive airway disease 02/11/2019   Conductive hearing loss 03/13/2018   Axillary hidradenitis suppurativa 04/03/2017   Preventative health care 12/06/2016   Vitamin D deficiency 10/18/2014   Lethargy 07/21/2014   Attention deficit hyperactivity disorder (ADHD), predominantly hyperactive type 02/11/2012    Past Surgical History:  Procedure Laterality Date   ADENOIDECTOMY N/A 10/15/2022   Procedure: ADENOIDECTOMY;  Surgeon: Serena Colonel, MD;  Location: [facility];  Service: ENT;  Laterality: N/A;   AXILLARY HIDRADENITIS EXCISION     AXILLARY LYMPH NODE BIOPSY Left 01/06/2019   CYSTOSCOPY W/ URETERAL STENT PLACEMENT Right 05/28/2017   Procedure: CYSTOSCOPY WITH RETROGRADE PYELOGRAM/ RIGHT URETERAL STENT PLACEMENT;  Surgeon: Crist Fat, MD;  Location: [facility];  Service: Urology;  Laterality: Right;   HYDRADENITIS EXCISION  Bilateral 04/03/2017   Procedure: EXCISION AND DRAINAGE BILATERAL  HIDRADENITIS AXILLA;  Surgeon: Griselda Miner, MD;  Location: [facility] OR;  Service: General;  Laterality: Bilateral;   INCISION AND DRAINAGE ABSCESS Right 07/20/2016   Procedure: INCISION AND DRAINAGE ABSCESS right axilla;  Surgeon: Abigail Miyamoto, MD;  Location: [facility] OR;  Service:  General;  Laterality: Right;   INTRAUTERINE DEVICE (IUD) INSERTION  11/2016   "had the one in my left arm removed"   IRRIGATION AND DEBRIDEMENT ABSCESS Right 12/21/2016   Procedure: IRRIGATION AND DEBRIDEMENT RIGHT AXILLARY HIDRADENITIS;  Surgeon: Andria Meuse, MD;  Location: [facility] OR;  Service: General;  Laterality: Right;   IRRIGATION AND DEBRIDEMENT ABSCESS Left 01/08/2017   Procedure: IRRIGATION AND DEBRIDEMENT AXILLARY ABSCESS;  Surgeon: Griselda Miner, MD;  Location: [facility] OR;  Service: General;  Laterality: Left;   NASAL SEPTOPLASTY W/ TURBINOPLASTY Bilateral 02/25/2019   Procedure: NASAL SEPTOPLASTY WITH TURBINATE REDUCTION;  Surgeon: Serena Colonel, MD;  Location: [facility];  Service: ENT;  Laterality: Bilateral;   TONSILLECTOMY Bilateral 09/14/2013   Procedure: BILATERAL TONSILLECTOMY;  Surgeon: Serena Colonel, MD;  Location: [facility];  Service: ENT;  Laterality: Bilateral;   TYMPANOPLASTY Bilateral    "rebuilt eardrums"   WISDOM TOOTH EXTRACTION      OB History   No obstetric history on file.      Home Medications    Prior to Admission medications   Medication Sig Start Date End Date Taking? Authorizing Provider  alum & mag hydroxide-simeth (MYLANTA MAXIMUM STRENGTH) 400-400-40 MG/5ML suspension Take 15 mLs by mouth every 6 (six) hours as needed for indigestion. 02/24/23  Yes Leath-Warren, Sadie Haber, NP  buprenorphine-naloxone (SUBOXONE) 2-0.5 mg SUBL SL tablet Place 1 tablet under the tongue.   Yes [provider]  ondansetron (ZOFRAN-ODT) 4 MG disintegrating tablet Take 1 tablet (4 mg total) by mouth every 8 (eight) hours as needed. 02/24/23  Yes Leath-Warren, Sadie Haber, NP  albuterol (VENTOLIN HFA) 108 (90 Base) MCG/ACT inhaler TAKE 2 PUFFS BY MOUTH EVERY 6 HOURS AS NEEDED FOR WHEEZE OR SHORTNESS OF BREATH 07/05/22   Eden Emms, NP  Azelastine HCl 137 MCG/SPRAY SOLN     [provider]  bifidobacterium infantis (ALIGN)  capsule Take 1 capsule by mouth daily.    [provider]  BIMZELX 160 MG/ML pen Inject into the skin. 05/15/22   [provider]  diphenhydrAMINE (BENADRYL) 25 mg capsule 25 mg every 6 (six) hours as needed.    [provider]  EPINEPHrine (EPIPEN 2-PAK) 0.3 mg/0.3 mL IJ SOAJ injection Inject 0.3 mg into the muscle as needed for anaphylaxis. 11/01/22   Eden Emms, NP  glucose blood (CONTOUR TEST) test strip And lancets #100 05/18/21   Romero Belling, MD  hydrOXYzine (ATARAX) 25 MG tablet TAKE 1 TABLET BY MOUTH EVERY 8 HOURS AS NEEDED FOR ITCHING. 01/16/22   Worthy Rancher B, FNP  inFLIXimab (REMICADE IV) Inject 1 Dose into the vein every 30 (thirty) days.    [provider]  inFLIXimab (REMICADE) 100 MG injection     [provider]  Iron-Vitamin C 100-250 MG TABS Take 1 tablet by mouth daily.    [provider]  meloxicam (MOBIC) 7.5 MG tablet Take 7.5 mg by mouth 2 (two) times daily as needed. 09/20/22   [provider]  methylphenidate (RITALIN) 20 MG tablet Take 1 tablet (20 mg total) by mouth daily before breakfast. 01/03/23   Eden Emms, NP  Microlet Lancets MISC See admin instructions. 05/18/21   [provider]  mometasone (ELOCON) 0.1 % lotion Apply 1 Application topically daily.    [provider]  montelukast (SINGULAIR) 10 MG tablet TAKE 1 TABLET BY MOUTH EVERYDAY AT BEDTIME 05/16/22   Eden Emms, NP  norethindrone (MICRONOR) 0.35 MG tablet Take 1 tablet by mouth daily. 07/19/21   [provider]  ondansetron (ZOFRAN) 4 MG tablet Take 1 tablet (4 mg total) by mouth every 6 (six) hours. 01/17/23   Glyn Ade, MD  rizatriptan (MAXALT) 10 MG tablet See Admin Instructions. PLEASE SEE ATTACHED FOR DETAILED DIRECTIONS    [provider]  SUMAtriptan (IMITREX) 100 MG tablet Take by mouth. 10/03/22   [provider]  topiramate (TOPAMAX) 25 MG tablet Take 25 mg by mouth 2 (two)  times daily.    [provider]  triamcinolone ointment (KENALOG) 0.1 % PLEASE SEE ATTACHED FOR DETAILED DIRECTIONS    [provider]  venlafaxine (EFFEXOR) 37.5 MG tablet Take 37.5 mg by mouth See admin instructions. START WITH 1 TABLET DAILY FOR ONE WEEK, THEN INCREASE TO 1 TABLET TWICE PER DAY FOR HEADACHES. 12/24/22   [provider]  Vitamin D, Ergocalciferol, (DRISDOL) 1.25 MG (50000 UNIT) CAPS capsule Take 1 capsule (50,000 Units total) by mouth every 7 (seven) days. 12/12/22   Eden Emms, NP    Family History Family History  Problem Relation Age of Onset   Hypertension Mother    Hyperlipidemia Mother    Diabetes Mother    Hyperlipidemia Father    Heart attack Father 43   Alcohol abuse Paternal Uncle    Parkinson's disease Maternal Grandmother    Dementia Maternal Grandmother    Diabetes Maternal Grandmother    Bell's palsy Maternal Grandmother     Social History Social History   Tobacco Use   Smoking status: Never   Smokeless tobacco: Never  Vaping Use   Vaping status: Former   Quit date: 07/03/2021   Substances: CBD  Substance Use Topics   Alcohol use: Not Currently    Comment: once a month   Drug use: Not Currently    Types: Methamphetamines, Cocaine    Comment: opioids     Allergies   Other, Shellfish allergy, Tapentadol, Tape, Bactrim [sulfamethoxazole-trimethoprim], Ginger, Morphine and codeine, and Vancomycin   Review of Systems Review of Systems Per HPI  Physical Exam Triage Vital Signs ED Triage Vitals  Encounter Vitals Group     BP 02/24/23 1050 121/79     Systolic BP Percentile --      Diastolic BP Percentile --      Pulse Rate 02/24/23 1050 81     Resp 02/24/23 1050 18     Temp 02/24/23 1050 98 F (36.7 C)     Temp Source 02/24/23 1050 Oral     SpO2 02/24/23 1050 99 %     Weight --      Height --      Head Circumference --      Peak Flow --      Pain Score 02/24/23 1051 8     Pain Loc --      Pain  Education --      Exclude from Growth Chart --    No data found.  Updated Vital Signs BP 104/66 (BP Location: Right Arm)   Pulse 83   Temp 98 F (36.7 C) (Oral)   Resp 18   LMP 01/08/2023 (Approximate)   SpO2  99%   Visual Acuity Right Eye Distance:   Left Eye Distance:   Bilateral Distance:    Right Eye Near:   Left Eye Near:    Bilateral Near:     Physical Exam Vitals and nursing note reviewed.  Constitutional:      General: She is not in acute distress.    Appearance: Normal appearance.  HENT:     Head: Normocephalic.  Eyes:     Extraocular Movements: Extraocular movements intact.     Conjunctiva/sclera: Conjunctivae normal.     Pupils: Pupils are equal, round, and reactive to light.  Cardiovascular:     Rate and Rhythm: Normal rate and regular rhythm.     Pulses: Normal pulses.     Heart sounds: Normal heart sounds.  Pulmonary:     Effort: Pulmonary effort is normal. No respiratory distress.     Breath sounds: Normal breath sounds. No stridor. No wheezing, rhonchi or rales.  Abdominal:     General: Bowel sounds are normal.     Palpations: Abdomen is soft.     Tenderness: There is abdominal tenderness in the epigastric area. There is no right CVA tenderness or left CVA tenderness.  Musculoskeletal:     Cervical back: Normal range of motion.  Lymphadenopathy:     Cervical: No cervical adenopathy.  Skin:    General: Skin is warm and dry.  Neurological:     General: No focal deficit present.     Mental Status: She is alert and oriented to person, place, and time.  Psychiatric:        Mood and Affect: Mood normal.        Behavior: Behavior normal.      UC Treatments / Results  Labs (all labs ordered are listed, but only abnormal results are displayed) Labs Reviewed  POCT URINALYSIS DIP (MANUAL ENTRY) - Abnormal; Notable for the following components:      Result Value   Clarity, UA hazy (*)    Spec Grav, UA >=1.030 (*)    Protein Ur, POC trace (*)     Leukocytes, UA Trace (*)    All other components within normal limits  URINE CULTURE  POCT URINE PREGNANCY    EKG   Radiology No results found.  Procedures Procedures (including critical care time)  Medications Ordered in UC Medications  alum & mag hydroxide-simeth (MAALOX/MYLANTA) 200-200-20 MG/5ML suspension 30 mL (30 mLs Oral Given 02/24/23 1204)  lidocaine (XYLOCAINE) 2 % viscous mouth solution 15 mL (15 mLs Mouth/Throat Given 02/24/23 1204)  ondansetron (ZOFRAN-ODT) disintegrating tablet 4 mg (4 mg Oral Given 02/24/23 1157)    Initial Impression / Assessment and Plan / UC Course  I have reviewed the triage vital signs and the nursing notes.  Pertinent labs & imaging results that were available during my care of the patient were reviewed by me and considered in my medical decision making (see chart for details).  On exam, she does have epigastric tenderness.  No fever, chills, and she is well-appearing, do not suspect acute abdomen at this time.  Zofran 4 mg ODT and GI cocktail administered with some improvement of symptoms.  Will treat symptomatically with Zofran 4 mg.  Supportive care recommendations were provided and discussed with the patient to include a brat diet, fluids such as Pedialyte, or Gatorlyte, and rest.  Recommended a bland diet until nausea and vomiting improved.  Patient was given strict ER follow-up precautions.  Patient was in agreement with this plan of care  and verbalizes understanding.  All questions were answered.  Patient stable for discharge.  Work note was provided.   Final Clinical Impressions(s) / UC Diagnoses   Final diagnoses:  Nausea and vomiting, unspecified vomiting type  Epigastric pain     Discharge Instructions      Take medication as prescribed. Increase fluids and allow for plenty of rest. May take over-the-counter Tylenol as needed for pain, fever, general discomfort. Recommend a brat diet until nausea improves.  This includes  bananas, rice, applesauce, and toast. Go to the emergency department immediately for worsening abdominal pain, nausea, or vomiting, or if you develop new symptoms of fever or other concerns. A urine culture is pending.  You will be contacted if the pending test result is abnormal. Follow-up as needed.     ED Prescriptions     Medication Sig Dispense Auth. Provider   alum & mag hydroxide-simeth (MYLANTA MAXIMUM STRENGTH) 400-400-40 MG/5ML suspension Take 15 mLs by mouth every 6 (six) hours as needed for indigestion. 355 mL Leath-Warren, Sadie Haber, NP   ondansetron (ZOFRAN-ODT) 4 MG disintegrating tablet Take 1 tablet (4 mg total) by mouth every 8 (eight) hours as needed. 20 tablet Leath-Warren, Sadie Haber, NP      PDMP not reviewed this encounter.   Abran Cantor, NP 02/24/23 1246

## 2023-02-24 NOTE — ED Notes (Signed)
After obtaining urine speicmen, patient states she is currently treating herself for a yeast infection with Monistat

## 2023-02-24 NOTE — Discharge Instructions (Addendum)
Take medication as prescribed. Increase fluids and allow for plenty of rest. May take over-the-counter Tylenol as needed for pain, fever, general discomfort. Recommend a brat diet until nausea improves.  This includes bananas, rice, applesauce, and toast. Go to the emergency department immediately for worsening abdominal pain, nausea, or vomiting, or if you develop new symptoms of fever or other concerns. A urine culture is pending.  You will be contacted if the pending test result is abnormal. Follow-up as needed.

## 2023-02-24 NOTE — ED Triage Notes (Signed)
Vomiting and nausea that started at midnight.  C/o of upper ABD pain.Marland Kitchen  took zofran at 6am this morning.

## 2023-02-25 ENCOUNTER — Ambulatory Visit: Payer: BC Managed Care – PPO

## 2023-02-25 DIAGNOSIS — L732 Hidradenitis suppurativa: Principal | ICD-10-CM

## 2023-02-25 LAB — URINE CULTURE: Culture: NO GROWTH

## 2023-02-25 MED ORDER — PREDNISONE 10 MG TABLET
ORAL_TABLET | ORAL | 0 refills | 20.00 days | Status: CP
Start: 2023-02-25 — End: 2023-03-17

## 2023-03-07 ENCOUNTER — Encounter (HOSPITAL_COMMUNITY): Payer: Self-pay | Admitting: Emergency Medicine

## 2023-03-07 ENCOUNTER — Emergency Department (HOSPITAL_COMMUNITY): Payer: Worker's Compensation

## 2023-03-07 ENCOUNTER — Emergency Department (HOSPITAL_COMMUNITY)
Admission: EM | Admit: 2023-03-07 | Discharge: 2023-03-07 | Disposition: A | Discharge status: Home / Self Care | Payer: Worker's Compensation | Attending: Emergency Medicine | Admitting: Emergency Medicine

## 2023-03-07 ENCOUNTER — Other Ambulatory Visit: Payer: Self-pay

## 2023-03-07 DIAGNOSIS — S8392XA Sprain of unspecified site of left knee, initial encounter: Secondary | ICD-10-CM | POA: Diagnosis not present

## 2023-03-07 DIAGNOSIS — M79673 Pain in unspecified foot: Secondary | ICD-10-CM | POA: Diagnosis not present

## 2023-03-07 DIAGNOSIS — S8992XA Unspecified injury of left lower leg, initial encounter: Secondary | ICD-10-CM | POA: Diagnosis not present

## 2023-03-07 DIAGNOSIS — Z79899 Other long term (current) drug therapy: Secondary | ICD-10-CM | POA: Diagnosis not present

## 2023-03-07 DIAGNOSIS — Z043 Encounter for examination and observation following other accident: Secondary | ICD-10-CM | POA: Diagnosis not present

## 2023-03-07 DIAGNOSIS — Y990 Civilian activity done for income or pay: Secondary | ICD-10-CM | POA: Insufficient documentation

## 2023-03-07 DIAGNOSIS — W19XXXA Unspecified fall, initial encounter: Secondary | ICD-10-CM | POA: Diagnosis not present

## 2023-03-07 DIAGNOSIS — W010XXA Fall on same level from slipping, tripping and stumbling without subsequent striking against object, initial encounter: Secondary | ICD-10-CM | POA: Insufficient documentation

## 2023-03-07 DIAGNOSIS — M25522 Pain in left elbow: Secondary | ICD-10-CM | POA: Insufficient documentation

## 2023-03-07 MED ORDER — KETOROLAC TROMETHAMINE 15 MG/ML IJ SOLN
30.0000 mg | Freq: Once | INTRAMUSCULAR | Status: AC
  Administered 2023-03-07: 30 mg via INTRAMUSCULAR
  Filled 2023-03-07: qty 2

## 2023-03-07 NOTE — ED Provider Notes (Signed)
 [place] [facility]ERGENCY DEPARTMENT AT [facility] Provider Note   CSN: [id number] Arrival date & time: 03/07/23  0406     History  Chief Complaint  Patient presents with   Courtney [name]    Courtney [name] is a 33 y.o. female.   Fall This is a new problem. Pertinent negatives include no chest pain, no abdominal pain and no headaches. The symptoms are aggravated by walking. The symptoms are relieved by rest.   Patient works the night shift at [employer], reports she slipped twice on a wet floor.  She reports she injured her left arm and left leg.  She has pain from her left elbow down as well as her left knee down.  Denies any head injury or LOC.  No neck or back pain.  No chest or abdominal pain.  She is not on anticoagulation   Past Medical History:  Diagnosis Date   ADHD (attention deficit hyperactivity disorder)    Bipolar disorder (HCC)    Chronic tonsillitis 08/2013   current strep, will finish antibiotic 09/04/2013; snores during sleep, mother denies apnea   Heart murmur    when I was born; outgrew it; I was a preemie   Hidradenitis    Hidradenitis 2017   Migraine 12/19/2016   I've just had this one for 3 days (12/20/2016)   Pulmonary emboli (HCC) 07/02/2021   BL   Septic shock (HCC) 07/02/2021    Home Medications Prior to Admission medications   Medication Sig Start Date End Date Taking? Authorizing Provider  albuterol  (VENTOLIN  HFA) 108 (90 Base) MCG/ACT inhaler TAKE 2 PUFFS BY MOUTH EVERY 6 HOURS AS NEEDED FOR WHEEZE OR SHORTNESS OF BREATH 07/05/22   Wendee Lynwood HERO, NP  alum & mag hydroxide-simeth (MYLANTA MAXIMUM STRENGTH) 400-400-40 MG/5ML suspension Take 15 mLs by mouth every 6 (six) hours as needed for indigestion. 02/24/23   Leath-Warren, Etta PARAS, NP  Azelastine  HCl 137 MCG/SPRAY SOLN     [provider]  bifidobacterium infantis (ALIGN) capsule Take 1 capsule by mouth daily.    [provider]  BIMZELX 160 MG/ML pen Inject into the skin.  05/15/22   [provider]  buprenorphine -naloxone (SUBOXONE) 2-0.5 mg SUBL SL tablet Place 1 tablet under the tongue.    [provider]  diphenhydrAMINE  (BENADRYL ) 25 mg capsule 25 mg every 6 (six) hours as needed.    [provider]  EPINEPHrine  (EPIPEN  2-PAK) 0.3 mg/0.3 mL IJ SOAJ injection Inject 0.3 mg into the muscle as needed for anaphylaxis. 11/01/22   Wendee Lynwood HERO, NP  glucose blood (CONTOUR TEST) test strip And lancets #100 05/18/21   Kassie Mallick, MD  hydrOXYzine  (ATARAX ) 25 MG tablet TAKE 1 TABLET BY MOUTH EVERY 8 HOURS AS NEEDED FOR ITCHING. 01/16/22   Webb, Padonda B, FNP  inFLIXimab  (REMICADE  IV) Inject 1 Dose into the vein every 30 (thirty) days.    [provider]  inFLIXimab  (REMICADE ) 100 MG injection     [provider]  Iron-Vitamin C 100-250 MG TABS Take 1 tablet by mouth daily.    [provider]  meloxicam (MOBIC) 7.5 MG tablet Take 7.5 mg by mouth 2 (two) times daily as needed. 09/20/22   [provider]  methylphenidate  (RITALIN ) 20 MG tablet Take 1 tablet (20 mg total) by mouth daily before breakfast. 01/03/23   Wendee Lynwood HERO, NP  Microlet Lancets MISC See admin instructions. 05/18/21   [provider]  mometasone  (ELOCON ) 0.1 % lotion Apply 1  Application topically daily.    [provider]  montelukast  (SINGULAIR ) 10 MG tablet TAKE 1 TABLET BY MOUTH EVERYDAY AT BEDTIME 05/16/22   Wendee Lynwood HERO, NP  norethindrone (MICRONOR) 0.35 MG tablet Take 1 tablet by mouth daily. 07/19/21   [provider]  ondansetron  (ZOFRAN -ODT) 4 MG disintegrating tablet Take 1 tablet (4 mg total) by mouth every 8 (eight) hours as needed. 02/24/23   Leath-Warren, Etta PARAS, NP  rizatriptan (MAXALT) 10 MG tablet See Admin Instructions. PLEASE SEE ATTACHED FOR DETAILED DIRECTIONS    [provider]  SUMAtriptan (IMITREX) 100 MG tablet Take by mouth. 10/03/22   [provider]  topiramate   (TOPAMAX ) 25 MG tablet Take 25 mg by mouth 2 (two) times daily.    [provider]  triamcinolone  ointment (KENALOG ) 0.1 % PLEASE SEE ATTACHED FOR DETAILED DIRECTIONS    [provider]  venlafaxine (EFFEXOR) 37.5 MG tablet Take 37.5 mg by mouth See admin instructions. START WITH 1 TABLET DAILY FOR ONE WEEK, THEN INCREASE TO 1 TABLET TWICE PER DAY FOR HEADACHES. 12/24/22   [provider]  Vitamin D , Ergocalciferol , (DRISDOL ) 1.25 MG (50000 UNIT) CAPS capsule Take 1 capsule (50,000 Units total) by mouth every 7 (seven) days. 12/12/22   Wendee Lynwood HERO, NP      Allergies    Other, Shellfish allergy, Tapentadol, Tape, Bactrim  [sulfamethoxazole -trimethoprim ], Ginger, Morphine  and codeine , and Vancomycin     Review of Systems   Review of Systems  Cardiovascular:  Negative for chest pain.  Gastrointestinal:  Negative for abdominal pain.  Neurological:  Negative for headaches.    Physical Exam Updated Vital Signs BP 107/66 (BP Location: Right Arm)   Pulse 71   Temp 98.7 F (37.1 C) (Oral)   Resp 16   Ht 1.549 m (5' 1)   LMP 01/08/2023 (Approximate)   SpO2 97%   BMI 35.05 kg/m  Physical Exam CONSTITUTIONAL: Well developed/well nourished HEAD: Normocephalic/atraumatic, no visible trauma EYES: EOMI/PERRL ENMT: Mucous membranes moist, no facial trauma NECK: supple no meningeal signs SPINE/BACK:entire spine nontender LUNGS: Lungs are clear to auscultation bilaterally, no apparent distress ABDOMEN: soft, nontender NEURO: Pt is awake/alert/appropriate, moves all extremitiesx4.  No facial droop.  GCS 15 EXTREMITIES: pulses normal/equal, full ROM Distal pulses equal and intact in all extremities. Diffuse tenderness to the left forearm Full range of motion of left shoulder and left elbow. Tenderness to palpation of left knee and left ankle. Full range of motion of left hip without difficulty All other extremities/joints palpated/ranged and nontender SKIN: warm,  color normal PSYCH: no abnormalities of mood noted, alert and oriented to situation  ED Results / Procedures / Treatments   Labs (all labs ordered are listed, but only abnormal results are displayed) Labs Reviewed - No data to display  EKG None  Radiology DG Forearm Left Result Date: 03/07/2023 CLINICAL DATA:  Fall earlier tonight onto left side with pain. EXAM: LEFT FOREARM - 2 VIEW COMPARISON:  None Available. FINDINGS: There is no evidence of fracture or other focal bone lesions. Soft tissues are unremarkable. IMPRESSION: Negative. Electronically Signed   By: Dorn Roulette M.D.   On: 03/07/2023 05:37   DG Ankle Complete Left Result Date: 03/07/2023 CLINICAL DATA:  Fall onto left side earlier with pain. EXAM: LEFT ANKLE COMPLETE - 3 VIEW COMPARISON:  None Available. FINDINGS: There is no evidence of fracture, dislocation, or joint effusion. There is no evidence of arthropathy or other focal bone abnormality. Soft tissues are unremarkable. IMPRESSION: Negative. Electronically  Signed   By: Dorn Roulette M.D.   On: 03/07/2023 05:37   DG Knee Complete 4 Views Left Result Date: 03/07/2023 CLINICAL DATA:  Fall earlier tonight onto left side with pain. EXAM: LEFT KNEE - COMPLETE 4 VIEW COMPARISON:  None Available. FINDINGS: No evidence of fracture, dislocation, or joint effusion. No evidence of arthropathy or other focal bone abnormality. Soft tissues are unremarkable. IMPRESSION: Negative. Electronically Signed   By: Dorn Roulette M.D.   On: 03/07/2023 05:36    Procedures Procedures    Medications Ordered in ED Medications  ketorolac  (TORADOL ) 15 MG/ML injection 30 mg (30 mg Intramuscular Given 03/07/23 0425)    ED Course/ Medical Decision Making/ A&P Clinical Course as of 03/07/23 0558  Thu Mar 07, 2023  0557 No acute fracture noted on x-rays.  Patient feels improved, improved range of motion of her extremities.  However she still has pain with ambulation of the left knee.  No other  signs of acute traumatic injury.  Will give crutches to remain nonweightbearing for the next several days.  Advised ice and elevation.  Follow-up with orthopedics next week if no improvement [DW]    Clinical Course User Index [DW] Midge Golas, MD                                 Medical Decision Making Amount and/or Complexity of Data Reviewed Radiology: ordered.  Risk Prescription drug management.           Final Clinical Impression(s) / ED Diagnoses Final diagnoses:  Sprain of left knee, unspecified ligament, initial encounter    Rx / DC Orders ED Discharge Orders     None         Midge Golas, MD 03/07/23 [phone]

## 2023-03-07 NOTE — ED Triage Notes (Signed)
 Pt was at work when she slipped on wet floor. C/o L arm pain (from elbow down) and L leg pain (from knee down).

## 2023-03-13 ENCOUNTER — Ambulatory Visit: Payer: BC Managed Care – PPO | Admitting: Nurse Practitioner

## 2023-03-15 DIAGNOSIS — L732 Hidradenitis suppurativa: Principal | ICD-10-CM

## 2023-03-15 MED ORDER — DOXYCYCLINE HYCLATE 100 MG TABLET
ORAL_TABLET | 0 refills | 0.00 days | Status: CP
Start: 2023-03-15 — End: ?

## 2023-03-18 ENCOUNTER — Encounter: Admit: 2023-03-18 | Discharge: 2023-03-19 | Payer: BLUE CROSS/BLUE SHIELD

## 2023-03-18 DIAGNOSIS — L732 Hidradenitis suppurativa: Secondary | ICD-10-CM | POA: Diagnosis not present

## 2023-03-18 DIAGNOSIS — Z79899 Other long term (current) drug therapy: Secondary | ICD-10-CM | POA: Diagnosis not present

## 2023-03-18 MED ADMIN — cetirizine (ZYRTEC) tablet 10 mg: 10 mg | ORAL | @ 16:00:00 | Stop: 2023-03-18

## 2023-03-18 MED ADMIN — inFLIXimab-axxq (AVSOLA) 10 mg/kg = 800 mg in sodium chloride (NS) 250 mL IVPB: 10 mg/kg | INTRAVENOUS | @ 16:00:00 | Stop: 2023-03-18

## 2023-03-18 MED ADMIN — acetaminophen (TYLENOL) tablet 650 mg: 650 mg | ORAL | @ 16:00:00 | Stop: 2023-03-18

## 2023-03-18 MED ADMIN — ondansetron (ZOFRAN) injection 8 mg: 8 mg | INTRAVENOUS | @ 16:00:00 | Stop: 2023-03-18

## 2023-03-18 MED ADMIN — diphenhydrAMINE (BENADRYL) capsule/tablet 25 mg: 25 mg | ORAL | @ 16:00:00 | Stop: 2023-03-18

## 2023-03-18 NOTE — Unmapped (Signed)
Patient presents for standard 800  mg Avsola (infliximab-axxq) infusion.  In no acute distress.  Vitals stable.  Reports no new medical issues or S/S of infection. Piv placed on her Right hand   See MAR for premeds.     1129  Avsola (infliximab-axxq) 800 mg infusing as follows:     20 ml/hr x 15 min  40 ml/hr x 15 min  80 ml/hr x 15 min  160 ml/hr x 15 min  300 ml/hr x 30 min  500 ml/hr for the remainder of the infusion.     1332 Avsola (infliximab-axxq) infusion complete.  PIV flushed with NS.  Vitals stable.  Patient without any s/s of adverse reaction.    IV  flushed with saline and removed with cath intact  Patient discharged from Infusion Center. Following visit scheduled

## 2023-03-21 ENCOUNTER — Ambulatory Visit: Admit: 2023-03-21 | Discharge: 2023-03-22 | Payer: BLUE CROSS/BLUE SHIELD

## 2023-03-21 DIAGNOSIS — L732 Hidradenitis suppurativa: Principal | ICD-10-CM

## 2023-03-21 NOTE — Unmapped (Signed)
Date of Service: 03/21/2023  Patient Name: Melissa Pearson  DOB: 1990-06-05 (33 y.o. female)  Phone: [phone]    Primary Care Provider:  Dayton Bailiff, MD  [address] [facility] [place] [address]    Referring Provider:  Dayton Bailiff, MD  [address]  [facility]  [place],  [place] [address]   [facility]    Karmen Bongo, MD, [facility]  [facility] [place]  [address] [place] [address]  Phone: [phone]  Fax: [phone]     ASSESSMENT - PLAN       ICD-10-CM   1. Hidradenitis suppurativa  L73.2       No orders of the defined types were placed in this encounter.       This is a pleasant 33 year old with early stage III chronic hidradenitis on immunomodulation therapies who underwent procedures most recently in 2023 and in early 2024, the last performed by myself in the right and left inguinal creases and right buttock.  She states these areas are doing well.  She presents to discuss repeating unroofing procedures this time on her central chest and right axilla.  She has active draining disease not to goal in these locations and her managing dermatology team has agreed with this plan and referred her for unroofing.    Management Plan / Recommendations : We plan for unroofing of the right axilla and likely also the central chest.  The future inframammary areas may be appropriate as well.  We discussed the procedure and expectations.  She has been through this before.  We discussed risks including but not limited to bleeding, infection, damage to surrounding structures and ongoing or recurrent disease.  She wished to proceed and surgery has been scheduled.    Physical Examination :  GENERAL: This is a well-developed, well-nourished female, not in acute distress, appears comfortable  HEENT: Head is normocephalic and atraumatic. Extraocular muscles are intact. No scleral icterus. Oral mucosa moist and pink without erythema or exudate.  NECK: Supple, no JVD. No cervical lymphadenopathy.  THYROID: No palpable masses  LUNGS: Normal work of breathing with equal chest wall expansion.  BREASTS: Deferred  HEART: Regular rate and rhythm.  Good peripheral perfusion  ABDOMEN: Soft, nontender, nondistended.  No organomegaly.   No hernia appreciated.  RECTAL EXAM: Deferred  EXTREMITIES: No clubbing, cyanosis or edema noted.  Normal gait.  SKIN:  Normal color, tone and turgor.  Previous area of unroofing/debridement in the groin/right buttock has healed very nicely.  Chronic discoloration from scarring present with no evidence of active disease in these areas.  Focal skin examination of the bilateral axillas and central chest reveal clustered areas inflammatory draining nodules much worse in the right axilla, minimal on the left and notably in the central chest.  The areas of inflammation, drainage and tunneling in the right axilla and central chest both measure separately approximately 6 to 8 cm by approximately 5 to 8 cm.  These areas are consistent with active flaring hidradenitis.  NEUROLOGIC: No focal neurologic deficits. Cranial nerves II through XII are grossly intact. Sensation is intact throughout.  Steady gait.  PSYCH:  Normal affect, Alert and Oriented x 3.          We reviewed the medications.  We reviewed no imaging.  We discussed the impression and plan going forward.  I answered all of the questions.    No follow-ups on file.  This note was generated using speech recognition software and may contain word substitutions or errors.    ROS   Review of Systems   Constitutional:  Negative for chills and diaphoresis.   Respiratory:  Negative for stridor.    Cardiovascular:  Negative for palpitations and leg swelling.   Gastrointestinal:  Negative for blood in stool.   Genitourinary:  Negative for dysuria and flank pain.   Musculoskeletal:  Negative for back pain and neck pain.   Skin:  Negative for rash. Neurological:  Negative for dizziness and headaches.   Endo/Heme/Allergies:  Negative for polydipsia. Does not bruise/bleed easily.          REASON FOR VISIT     03/21/2023:  Chief Complaint   Patient presents with    Hidradenitis, chest, axiila, groin.          HISTORY     PMH: She has Hidradenitis suppurativa; Attention deficit hyperactivity disorder (ADHD), predominantly hyperactive type; Bipolar 2 disorder, major depressive episode (CMS-HCC); Anxiety disorder; Anemia; Acute metabolic encephalopathy; Prediabetes; Obesity (BMI 35.0-39.9 without comorbidity); Insomnia; Pulmonary embolism (CMS-HCC); Vitamin D deficiency; Opioid use disorder; Stimulant use disorder; and Other chronic pain on their problem list.   PSH: She  has a past surgical history that includes Skin biopsy; Tonsillectomy; Inner ear surgery (Bilateral); and pr excision hidradenitis inguinal complex repair (Bilateral, 03/09/2022).     FH: Her family history includes Allergy (severe) in her mother; Diabetes in her mother; Stroke in her maternal grandmother. There is no history of Melanoma, Basal cell carcinoma, or Squamous cell carcinoma.   SH: She  reports that she has quit smoking. Her smoking use included e-cigarettes. She has never used smokeless tobacco. She reports that she does not currently use alcohol. She reports that she does not currently use drugs after having used the following drugs: Cocaine, Marijuana, and Methamphetamines.     Home Address  [address]  [place] [address]   Employer  [employer]     Emergency Contacts  Emergency Contacts    None on File       Other Contacts       Name Relation Home Work [place] Mother   [phone]    [place] Cater Relative   [phone]    Coad,Marty Father   [phone]             MEDICATIONS     After Visit Medications  Current Outpatient Medications   Medication Instructions    albuterol HFA 90 mcg/actuation inhaler ProAir HFA 90 mcg/actuation aerosol inhaler    azelastine (ASTELIN) 137 mcg (0.1 %) nasal spray PLACE 1 SPRAY INTO BOTH NOSTRILS 2 (TWO) TIMES DAILY. USE IN EACH NOSTRIL AS DIRECTED    beclomethasone dipropionate (QVAR REDIHALER) 40 mcg/actuation inhaler 1 puff, Inhalation, 2 times a day (standard)    Bifidobacterium infantis (ALIGN ORAL) Take by mouth.    bimekizumab-bkzx 160 mg/mL AtIn Inject the contents of 2 syringes (320 mg total)  every 8 weeks.    bimekizumab-bkzx 160 mg/mL AtIn Inject the contents of 2 syringes (320 mg total)  under the skin every twenty-eight (28) days.    buprenorphine-naloxone (SUBOXONE) 8-2 mg sublingual tablet 8 mg of buprenorphine, Sublingual, 2 times a day (standard)    diphenhydrAMINE (BENADRYL) 25 mg, Every 6 hours PRN    doxycycline (VIBRA-TABS) 100 MG tablet TAKE 1 TABLET BY MOUTH 2X DAILY FOR 7-10 DAYS AS NEEDED FOR FLARES    empty container Misc Use as directed to  dispose of Cosentyx pens.    EPINEPHrine (EPIPEN) 0.3 mg/0.3 mL injection INJECTAS DIRECTED AS NEEDED FOR SYSTEMIC REACTIONS    ergocalciferol-1,250 mcg (50,000 unit) (DRISDOL) 50,000 Units, Weekly    erythromycin (ROMYCIN) 5 mg/gram (0.5 %) ophthalmic ointment     fluticasone propion-salmeterol (ADVAIR HFA) 115-21 mcg/actuation inhaler 2 puffs, Inhalation, 2 times a day (standard)    hydrOXYzine (ATARAX) 25 mg, Oral, Every 8 hours PRN    infliximab (REMICADE IV) Remicade    iron,carbonyl-vitamin C 100-250 mg Tab 1 tablet, Daily (standard)    meloxicam (MOBIC) 7.5 mg, Oral, 2 times a day PRN, Take with food.    methylphenidate HCl (RITALIN) 20 mg, Oral, Daily (standard)    mometasone (ELOCON) 0.1 % solution Daily (standard)    montelukast (SINGULAIR) 10 mg tablet TAKE 1 TABLET AT BEDTIME    naloxone (NARCAN) 4 mg nasal spray One spray in either nostril once for known/suspected opioid overdose. May repeat every 2-3 minutes in alternating nostril til EMS arrives    norethindrone (MICRONOR) 0.35 mg tablet 1 tablet, Daily (standard)    nystatin (MYCOSTATIN) 100,000 unit/gram ointment Topical, 2 times a day (standard), To yeast rash    nystatin (MYCOSTATIN) 100,000 unit/gram powder Apply topically.    ondansetron (ZOFRAN) 4 mg    sumatriptan (IMITREX) 100 mg    topiramate (TOPAMAX) 25 MG tablet 25 MG NIGHTLY X 7 DAYS THEN 25 MG TWICE DAILY    venlafaxine (EFFEXOR) 37.5 mg, 2 times a day (standard)    New Prescriptions    No medications on file     Modified Medications    No medications on file     Discontinued Medications    No medications on file      Allergies  She is allergic to adhesive tape-silicones, ginger, other, amoxicillin-pot clavulanate, opioids - morphine analogues, sulfamethoxazole-trimethoprim, iodine, and vancomycin.      RISK FACTORS - VITALS     Vitals  BP 116/78 (BP Site: L Wrist, BP Position: Sitting, BP Cuff Size: Medium)  - Pulse 98  - Ht 154.9 cm (5' 1)  - Wt 79.4 kg (175 lb) Comment: Stated - BMI 33.07 kg/m??       Blood pressure  BP Readings from Last 6 Encounters:   03/21/23 116/78   03/18/23 99/55   02/18/23 89/57   02/11/23 102/58   01/22/23 124/65   01/21/23 99/66         Glucose  Lab Results   Component Value Date    A1C 5.2 11/27/2017      Weight  Body mass index is 33.07 kg/m??.  Wt Readings from Last 6 Encounters:   03/21/23 79.4 kg (175 lb)   03/18/23 79.4 kg (175 lb)   02/18/23 79.9 kg (176 lb 1.6 oz)   02/11/23 80.1 kg (176 lb 9.6 oz)   01/22/23 82.6 kg (182 lb)   01/21/23 82.6 kg (182 lb)         LABS     Lab Results   Component Value Date    WBC 9.8 02/18/2023    HGB 11.6 02/18/2023    MCV 86.5 02/18/2023    PLT 266 02/18/2023     No results found for: PT, PTT, INR, DDIMER   Lab Results   Component Value Date    BUN 7 (L) 02/18/2023    CREATININE 0.62 02/18/2023    EGFR >90 02/18/2023    GFRNAAF >90 06/13/2020    GFRAAF >90 06/13/2020    Lab Results  Component Value Date    AST 19 02/18/2023    ALT 13 02/18/2023       Lab Results   Component Value Date    CREATININE 0.62 02/18/2023    CREATININE 0.84 12/24/2022    CREATININE 0.98 10/29/2022 CREATININE 0.71 09/03/2022    CREATININE 0.76 07/09/2022    CREATININE 0.64 05/14/2022             Reviewed pertinent external/referral notes. Physical exam performed with pertinent findings noted in assessment / plan. Reviewed pertinent objective data / prior tests. Reviewed pertinent medications / prescription drug management.

## 2023-03-22 NOTE — Unmapped (Signed)
Copied from CRM #[id number]. Topic: Access To Clinicians - Medication Refill  >> Mar 22, 2023 12:19 PM Maudry Mayhew wrote:  The PAC has received an incoming call requesting medication refill/clarification/question:   Rx refill    Caller:  Dilia Alemany    Best callback number: [phone]    Type of request (refill, clarification, question):  Rx Refill     Name of medication:      methylphenidate HCl (RITALIN) 20 MG tablet [[id number]]       Is there a preferred amount requested (e.g. 30 pills, 3 months worth - if no leave blank):   Desired pharmacy:       [id number] [place], [place] - [address]  [address] [place]  [place] [address]  Phone: [phone] Fax: [phone]                      Does caller request a callback from a nurse to discuss?:

## 2023-03-24 MED ORDER — METHYLPHENIDATE 20 MG TABLET
ORAL_TABLET | Freq: Every day | ORAL | 0 refills | 30.00 days
Start: 2023-03-24 — End: ?

## 2023-03-26 DIAGNOSIS — L732 Hidradenitis suppurativa: Principal | ICD-10-CM

## 2023-03-26 MED ORDER — METHYLPHENIDATE 20 MG TABLET
ORAL_TABLET | Freq: Every day | ORAL | 0 refills | 30.00 days | Status: CP
Start: 2023-03-26 — End: ?

## 2023-04-04 ENCOUNTER — Emergency Department
Admission: EM | Admit: 2023-04-04 | Discharge: 2023-04-04 | Discharge status: Against Medical Advice | Payer: BC Managed Care – PPO | Attending: Emergency Medicine | Admitting: Emergency Medicine

## 2023-04-04 DIAGNOSIS — Z5321 Procedure and treatment not carried out due to patient leaving prior to being seen by health care provider: Secondary | ICD-10-CM | POA: Insufficient documentation

## 2023-04-04 DIAGNOSIS — R1084 Generalized abdominal pain: Secondary | ICD-10-CM | POA: Diagnosis not present

## 2023-04-04 DIAGNOSIS — R109 Unspecified abdominal pain: Secondary | ICD-10-CM | POA: Diagnosis not present

## 2023-04-04 NOTE — ED Notes (Signed)
No answer when called several times from lobby

## 2023-04-04 NOTE — ED Triage Notes (Signed)
EMS brings pt in for c/o left sided abd pain PTA

## 2023-04-07 NOTE — Unmapped (Addendum)
For your HS:    - Take 1 tablet of doxycycline (100 mg) twice daily as needed for flares. Take with food and a full glass of water.   - Apply nystatin ointment as needed for yeast infections.    - Inject the contents of two bimekizumab-bkzx syringes (160 mg/ml total) under the skin every 4 weeks as maintenance.     - Continue infliximab 10 mg/kg injections every 4 weeks.

## 2023-04-07 NOTE — Unmapped (Signed)
Dermatology Note     Assessment and Plan:      Hidradenitis Suppurativa Hurley Stage III, chronic, flaring, on infliximab and bimekizumab  - Previously failed: Humira, Ertapenem X 12 week course completed 06/2021 (complication of PE requiring hospitalization - avoiding PICC lines in future due to this), Spironolactone (non-Derm provider discontinued due to risk of low blood pressure with other medication), augmentin (chest pain), clindamycin (C-diff), cefdinir  - Previous surgeries: unroofing left axilla 06/2021, L & R inguinal crease with Dr. Orvan Falconer 2024  - Locations: axillae (R>L), inframammary, infra-pannus, groin, inner thighs, chest  - Increase bimekizumab-bkzx 320 mg/32ml from every 8 weeks to every 4 weeks (HS dosing)  - Continue doxycycline 100 mg BID as needed for flares. Take with food and a full glass of water.  Patient reports yeast infections with doxycycline use. Oral fluconazole contraindicated with current meds. patient reports nystatin ointment and powder provides improvement..  - Continue infliximab 10 mg/kg infusions every 4 weeks.  - Continue nystatin 100,000 unit/gram ointment as needed for yeast infections.   - Continue following with [facility] for ongoing chronic pain management.  - Surgery scheduled on 04/19/23 with Dr. Orvan Falconer   - Jointly elected to proceed with ILK injections in clinic today. See procedure note below.     Intralesional Kenalog Procedure Note: After the patient was informed of risks (including atrophy and dyspigmentation), benefits and side effects of intralesional steroid injection, the patient elected to undergo injection and verbal consent was obtained. Skin was cleaned with alcohol and injected intralesionally into the sites (below). The patient tolerated the procedure well without complications and was instructed on post-procedure care.  Location(s): Chest (3), R axilla (1)  Number of sites treated: 4  Kenalog (triamcinolone) Concentration: 40 mg/ml   Volume: 1 ml total     Inverse psoriasis, groin  - Continue bimekizumab, infliximab as above  - Continue triamcinolone 0.1% ointment. Apply to inflamed areas of HS twice a day until improved, no longer than 2 weeks at a time. Then stop for two weeks. Restart as needed.     High risk medication use - bimekizumab, infliximab  - Last Angela Burke 11/2022 negative.   - Monitoring labs q8 weeks w/ infusions  - Reviewed AST, ALT, BUN, CBC w/ Diff, Creatinine from 02/18/23 - acceptable to continue.     The patient was advised to call for an appointment should any new, changing, or symptomatic lesions develop.     RTC: Return in about 3 months (around 07/07/2023) for follow up of HS. or sooner as needed   _________________________________________________________________      Chief Complaint     Chief Complaint   Patient presents with    Follow-up     HS follow-up: patient is currently having a flare. Patient endorses pain, itching, drainage, and odor.       HPI     Melissa Pearson is a 33 y.o. female who presents as a returning patient (last seen by Lowry Bowl, PA on 02/05/2023) to Dermatology for follow up of HS. At last visit, patient was to continue doxycycline 100 mg twice daily, continue bimekizumab-bkzx 160 mg/mL every 8 weeks as maintenance, and continue infliximab 10 mg/kg injections every 4 weeks for her hidradenitis suppurativa. She was also to continue triamcinolone 0.1% ointment for her inverse psoriasis.    Today, patient reports stability of her hidradenitis suppurativa. She notes flares on her chest and under her arms. Endorses tenderness and drainage. Currently treating with triamcinolone ointment,  infilximab, and bimekizumab-bkzx with some improvement. Reports also taking doxycycline as needed for flares. She is scheduled for an unroofing procedure of her right underarm on 04/19/23 with Dr. Orvan Falconer.     The patient denies any other new or changing lesions or areas of concern.     Pertinent Past Medical History     No history of skin cancer    Family History:   Negative for melanoma    Past Medical History, Family History, Social History, Medication List, Allergies, and Problem List were reviewed in the rooming section of Epic.     ROS: Other than symptoms mentioned in the HPI, no fevers, chills, or other skin complaints    Physical Examination     GENERAL: Well-appearing female in no acute distress, resting comfortably.  NEURO: Alert and oriented, answers questions appropriately  PSYCH: Normal mood and affect  SKIN: Examination of the bilateral axillae and chest was performed  - Multiple non inflamed nodules and nondraining sinus tracts of the inframammary folds  - Nondraining sinus of the right axilla  - Groin exam deferred today.    All areas not commented on are within normal limits or unremarkable    Scribe's Attestation: Gentry Fitz Robynn Pane) Natasha Bence, PA-C obtained and performed the history, physical exam and medical decision making elements that were entered into the chart. Signed by Marin Roberts, Scribe, on April 09, 2023 at 2:22 PM.    ----------------------------------------------------------------------------------------------------------------------  April 09, 2023 3:28 PM. Documentation assistance provided by the Scribe. I was present during the time the encounter was recorded. The information recorded by the Scribe was done at my direction and has been reviewed and validated by me.  ----------------------------------------------------------------------------------------------------------------------      (Approved Template 11/16/2019)

## 2023-04-09 ENCOUNTER — Ambulatory Visit
Admit: 2023-04-09 | Discharge: 2023-04-10 | Payer: BLUE CROSS/BLUE SHIELD | Attending: Student in an Organized Health Care Education/Training Program | Primary: Student in an Organized Health Care Education/Training Program

## 2023-04-09 DIAGNOSIS — Z79899 Other long term (current) drug therapy: Principal | ICD-10-CM

## 2023-04-09 DIAGNOSIS — L732 Hidradenitis suppurativa: Principal | ICD-10-CM

## 2023-04-09 DIAGNOSIS — L408 Other psoriasis: Principal | ICD-10-CM

## 2023-04-09 DIAGNOSIS — H9012 Conductive hearing loss, unilateral, left ear, with unrestricted hearing on the contralateral side: Secondary | ICD-10-CM | POA: Diagnosis not present

## 2023-04-09 MED ORDER — BIMEKIZUMAB-BKZX 160 MG/ML SUBCUTANEOUS AUTO-INJECTOR
SUBCUTANEOUS | 11 refills | 28.00 days | Status: CN
Start: 2023-04-09 — End: ?

## 2023-04-09 MED ORDER — BIMEKIZUMAB-BKZX 320 MG/2 ML SUBCUTANEOUS AUTO-INJECTOR
SUBCUTANEOUS | 11 refills | 0.00 days | Status: CP
Start: 2023-04-09 — End: ?
  Filled 2023-05-14: qty 2, 28d supply, fill #0

## 2023-04-09 MED ADMIN — triamcinolone acetonide (KENALOG-40) injection 40 mg: 40 mg | @ 20:00:00 | Stop: 2023-04-09

## 2023-04-10 MED ORDER — QVAR REDIHALER 40 MCG/ACTUATION HFA BREATH ACTIVATED AEROSOL
Freq: Two times a day (BID) | RESPIRATORY_TRACT | PRN refills | 0.00 days | Status: CP
Start: 2023-04-10 — End: ?

## 2023-04-12 DIAGNOSIS — L408 Other psoriasis: Principal | ICD-10-CM

## 2023-04-12 DIAGNOSIS — L732 Hidradenitis suppurativa: Principal | ICD-10-CM

## 2023-04-12 NOTE — Unmapped (Signed)
Clinical Assessment Needed For: Dose Change  Medication: BIMZELX AUTOINJECTOR 320 mg/2 mL Atin (bimekizumab-bkzx)  Last Fill Date/Day Supply: 1610.96 / 28 days  Copay $7988.94  Was previous dose already scheduled to fill: No    Notes to Pharmacist: I have submitted a HCR

## 2023-04-15 ENCOUNTER — Encounter: Admit: 2023-04-15 | Discharge: 2023-04-16 | Payer: BLUE CROSS/BLUE SHIELD

## 2023-04-15 DIAGNOSIS — L732 Hidradenitis suppurativa: Secondary | ICD-10-CM | POA: Diagnosis not present

## 2023-04-15 LAB — SEDIMENTATION RATE: ERYTHROCYTE SEDIMENTATION RATE: 90 mm/h — ABNORMAL HIGH (ref 0–20)

## 2023-04-15 LAB — ALT: ALT (SGPT): 11 U/L (ref 10–49)

## 2023-04-15 LAB — C-REACTIVE PROTEIN: C-REACTIVE PROTEIN: 17 mg/L — ABNORMAL HIGH (ref <=10.0)

## 2023-04-15 LAB — CBC W/ AUTO DIFF
BASOPHILS ABSOLUTE COUNT: 0.1 10*9/L (ref 0.0–0.1)
BASOPHILS RELATIVE PERCENT: 1 %
EOSINOPHILS ABSOLUTE COUNT: 0.1 10*9/L (ref 0.0–0.5)
EOSINOPHILS RELATIVE PERCENT: 1.3 %
HEMATOCRIT: 37.2 % (ref 34.0–44.0)
HEMOGLOBIN: 12.6 g/dL (ref 11.3–14.9)
LYMPHOCYTES ABSOLUTE COUNT: 3.8 10*9/L — ABNORMAL HIGH (ref 1.1–3.6)
LYMPHOCYTES RELATIVE PERCENT: 35.4 %
MEAN CORPUSCULAR HEMOGLOBIN CONC: 33.9 g/dL (ref 32.0–36.0)
MEAN CORPUSCULAR HEMOGLOBIN: 29.5 pg (ref 25.9–32.4)
MEAN CORPUSCULAR VOLUME: 87 fL (ref 77.6–95.7)
MEAN PLATELET VOLUME: 8 fL (ref 6.8–10.7)
MONOCYTES ABSOLUTE COUNT: 0.8 10*9/L (ref 0.3–0.8)
MONOCYTES RELATIVE PERCENT: 7.7 %
NEUTROPHILS ABSOLUTE COUNT: 5.8 10*9/L (ref 1.8–7.8)
NEUTROPHILS RELATIVE PERCENT: 54.6 %
PLATELET COUNT: 313 10*9/L (ref 150–450)
RED BLOOD CELL COUNT: 4.27 10*12/L (ref 3.95–5.13)
RED CELL DISTRIBUTION WIDTH: 15.1 % (ref 12.2–15.2)
WBC ADJUSTED: 10.7 10*9/L (ref 3.6–11.2)

## 2023-04-15 LAB — CREATININE
CREATININE: 0.75 mg/dL (ref 0.55–1.02)
EGFR CKD-EPI (2021) FEMALE: >90 mL/min/{1.73_m2} (ref >=60)

## 2023-04-15 LAB — AST: AST (SGOT): 17 U/L (ref <=34)

## 2023-04-15 LAB — BUN: BLOOD UREA NITROGEN: 9 mg/dL (ref 9–23)

## 2023-04-15 MED ADMIN — inFLIXimab-axxq (AVSOLA) 10 mg/kg = 800 mg in sodium chloride (NS) 250 mL IVPB: 10 mg/kg | INTRAVENOUS | @ 20:00:00 | Stop: 2023-04-15

## 2023-04-15 MED ADMIN — acetaminophen (TYLENOL) tablet 650 mg: 650 mg | ORAL | @ 19:00:00 | Stop: 2023-04-15

## 2023-04-15 MED ADMIN — cetirizine (ZYRTEC) tablet 10 mg: 10 mg | ORAL | @ 19:00:00 | Stop: 2023-04-15

## 2023-04-15 MED ADMIN — diphenhydrAMINE (BENADRYL) capsule/tablet 25 mg: 25 mg | ORAL | @ 19:00:00 | Stop: 2023-04-15

## 2023-04-15 MED ADMIN — ondansetron (ZOFRAN) injection 8 mg: 8 mg | INTRAVENOUS | @ 20:00:00 | Stop: 2023-04-15

## 2023-04-15 NOTE — Unmapped (Signed)
Patient presents for standard Avsola (infliximab-axxq) infusion.  In no acute distress. Vitals stable.  Reports no new medical issues or S/S of infection.  PIV placed, labs drawn per orders, PIV flushed with NS, patient tolerated well.  See MAR for premeds. Future appointments scheduled x 1.     1502 Avsola (infliximab-axxq) 800 mg infusing as follows:     68ml/hr x 15 min  53ml/hr x 15 min  27ml/hr x 15 min  2ml/hr x 15 min  188ml/hr x 30 min  248ml/hr for the remainder of the infusion.    1708Avsola (infliximab-axxq) infusion complete.  PIV flushed with NS.  Vitals stable.  Patient without any s/s of adverse reaction.IV d/c'd, gauze/coban applied to site.  [facility] from [facility].

## 2023-04-19 ENCOUNTER — Inpatient Hospital Stay: Admit: 2023-04-19 | Discharge: 2023-04-19 | Payer: BLUE CROSS/BLUE SHIELD

## 2023-04-19 ENCOUNTER — Encounter
Admit: 2023-04-19 | Discharge: 2023-04-19 | Payer: BLUE CROSS/BLUE SHIELD | Attending: Anesthesiology | Primary: Anesthesiology

## 2023-04-19 DIAGNOSIS — Z86711 Personal history of pulmonary embolism: Secondary | ICD-10-CM | POA: Diagnosis not present

## 2023-04-19 DIAGNOSIS — L723 Sebaceous cyst: Secondary | ICD-10-CM | POA: Diagnosis not present

## 2023-04-19 DIAGNOSIS — L732 Hidradenitis suppurativa: Secondary | ICD-10-CM | POA: Diagnosis not present

## 2023-04-19 DIAGNOSIS — F3181 Bipolar II disorder: Secondary | ICD-10-CM | POA: Diagnosis not present

## 2023-04-19 DIAGNOSIS — J449 Chronic obstructive pulmonary disease, unspecified: Secondary | ICD-10-CM | POA: Diagnosis not present

## 2023-04-19 DIAGNOSIS — F17210 Nicotine dependence, cigarettes, uncomplicated: Secondary | ICD-10-CM | POA: Diagnosis not present

## 2023-04-19 DIAGNOSIS — Z792 Long term (current) use of antibiotics: Secondary | ICD-10-CM | POA: Diagnosis not present

## 2023-04-19 DIAGNOSIS — F901 Attention-deficit hyperactivity disorder, predominantly hyperactive type: Secondary | ICD-10-CM | POA: Diagnosis not present

## 2023-04-19 LAB — PREGNANCY, URINE: PREGNANCY TEST URINE: NEGATIVE

## 2023-04-19 MED ORDER — HYDROCODONE 5 MG-ACETAMINOPHEN 325 MG TABLET
ORAL_TABLET | ORAL | 0 refills | 2.00 days | Status: CP | PRN
Start: 2023-04-19 — End: 2023-04-24

## 2023-04-19 MED ORDER — ONDANSETRON 4 MG DISINTEGRATING TABLET
ORAL_TABLET | Freq: Three times a day (TID) | ORAL | 0 refills | 5.00 days | Status: CP | PRN
Start: 2023-04-19 — End: 2023-04-24

## 2023-04-19 MED ORDER — DOCUSATE SODIUM 100 MG CAPSULE
ORAL_CAPSULE | Freq: Two times a day (BID) | ORAL | 0 refills | 14.00 days | Status: CP
Start: 2023-04-19 — End: 2023-05-03

## 2023-04-19 MED ADMIN — acetaminophen (TYLENOL) tablet 975 mg: 1000 mg | ORAL | @ 14:00:00 | Stop: 2023-04-19

## 2023-04-19 MED ADMIN — Propofol (DIPRIVAN) injection: INTRAVENOUS | @ 15:00:00 | Stop: 2023-04-19

## 2023-04-19 MED ADMIN — fentaNYL (PF) (SUBLIMAZE) injection 25 mcg: 25 ug | INTRAVENOUS | @ 16:00:00 | Stop: 2023-04-19

## 2023-04-19 MED ADMIN — fentaNYL (PF) (SUBLIMAZE) injection: INTRAVENOUS | @ 16:00:00 | Stop: 2023-04-19

## 2023-04-19 MED ADMIN — lidocaine (PF) (XYLOCAINE-MPF) 20 mg/mL (2 %) injection: INTRAVENOUS | @ 15:00:00 | Stop: 2023-04-19

## 2023-04-19 MED ADMIN — phenylephrine 1 mg/10 mL (100 mcg/mL) injection Syrg: INTRAVENOUS | @ 15:00:00 | Stop: 2023-04-19

## 2023-04-19 MED ADMIN — clindamycin (CLEOCIN) 900 mg/50 mL IVPB 900 mg: 900 mg | INTRAVENOUS | @ 15:00:00 | Stop: 2023-04-19

## 2023-04-19 MED ADMIN — midazolam (VERSED) injection: INTRAVENOUS | @ 15:00:00 | Stop: 2023-04-19

## 2023-04-19 MED ADMIN — bupivacaine-EPINEPHrine (PF) (MARCAINE-PF w/EPI) 0.5 %-1:200,000 injection (PF): @ 16:00:00 | Stop: 2023-04-19

## 2023-04-19 MED ADMIN — ondansetron (ZOFRAN) injection: INTRAVENOUS | @ 16:00:00 | Stop: 2023-04-19

## 2023-04-19 MED ADMIN — lactated Ringers infusion: 10 mL/h | INTRAVENOUS | @ 15:00:00 | Stop: 2023-04-19

## 2023-04-19 MED ADMIN — Propofol (DIPRIVAN) injection: INTRAVENOUS | @ 16:00:00 | Stop: 2023-04-19

## 2023-04-19 MED ADMIN — lactated Ringers infusion: 10 mL/h | INTRAVENOUS | @ 14:00:00 | Stop: 2023-04-19

## 2023-04-19 NOTE — Unmapped (Signed)
 Date of Surgery: 04/19/2023    Patient Name: Melissa Pearson  DOB: 1990-09-29 (33 y.o. female)       [facility] SURGERY    Surgeons and Role:     * Harvin Hazel, MD - Primary    Karmen Bongo, MD, FACS     OPERATIVE NOTE: ([id number])       Procedure(s):  1.  EXCISION OF RIGHT AXILLARY HIDRADENITIS  2.  EXCISION OF CENTRAL CHEST WALL HIDRADENITIS    Pre-op Diagnosis:      Diagnosis ICD-10-CM Associated Orders   1. Hidradenitis suppurativa  L73.2 clindamycin (CLEOCIN) 900 mg/50 mL IVPB 900 mg     Surgical pathology exam     Surgical pathology exam         Post-op Diagnosis: Same    Indications for Surgery:    Diagnosis ICD-10-CM Associated Orders   1. Hidradenitis suppurativa  L73.2 clindamycin (CLEOCIN) 900 mg/50 mL IVPB 900 mg     Surgical pathology exam     Surgical pathology exam           Findings: Hidradenitis    Anesthesia: General    Estimated Blood Loss: 15mL    Complications: None    Specimens:   ID Type Source Tests Collected by Time Destination   1 : hidradenitis chest and right axilla Tissue Chest SURGICAL PATHOLOGY Hulan Fray, MD 04/19/2023 1047        Implants:  * No implants in log *    PROCEDURE     The patient is brought to the operating room placed on the operative table in the supine position where she was appropriately padded and underwent induction of anesthesia without complication.  Perioperative antibiotics were given.  SCDs were placed and operative throughout the procedure.  The marked area(s) was/were widely prepped and draped in the usual standard fashion.    A timeout was performed by the circulating nurse and all present were in agreement.    Under the patient supervision in the preoperative holding area we had marked the areas of planned unroofing/resection today.  First, I turned my attention to central chest.  This area was unroofed and any clustered nodules were resected.  Tunnels were followed and opened until the chronic areas of inflammation or thoroughly unroofed or removed.  This sweat gland/hidradenitis excision included skin and subcutaneous tissues and ultimately measured 8 cm x 8 cm.    Next I turned my attention to right axilla.  In an identical fashion as above, this area was addressed, and removed and excised with the ultimate area of excision/unroofing measuring 6 cm x 8 cm.    Next hemostasis was tediously achieved with cautery.    Dressings were then applied.  Lidocaine jelly followed by Xeroform petroleum gauze was placed on the open wounds.  Dry gauze was placed over this and Medipore porous medical tape was applied.    All counts were reported to me as correct.    Surgeon Notes: I performed the procedure    Karmen Bongo, MD, FACS.  Date: 04/19/2023  Time: 11:24 AM

## 2023-04-23 ENCOUNTER — Emergency Department
Admit: 2023-04-23 | Discharge: 2023-04-24 | Disposition: A | Discharge status: Home / Self Care | Payer: BLUE CROSS/BLUE SHIELD | Attending: Emergency Medicine

## 2023-04-23 DIAGNOSIS — R21 Rash and other nonspecific skin eruption: Principal | ICD-10-CM

## 2023-04-23 DIAGNOSIS — Z885 Allergy status to narcotic agent status: Secondary | ICD-10-CM | POA: Diagnosis not present

## 2023-04-23 DIAGNOSIS — Z881 Allergy status to other antibiotic agents status: Secondary | ICD-10-CM | POA: Diagnosis not present

## 2023-04-23 DIAGNOSIS — F319 Bipolar disorder, unspecified: Secondary | ICD-10-CM | POA: Diagnosis not present

## 2023-04-23 DIAGNOSIS — F901 Attention-deficit hyperactivity disorder, predominantly hyperactive type: Secondary | ICD-10-CM | POA: Diagnosis not present

## 2023-04-23 DIAGNOSIS — Z882 Allergy status to sulfonamides status: Secondary | ICD-10-CM | POA: Diagnosis not present

## 2023-04-23 DIAGNOSIS — Z87891 Personal history of nicotine dependence: Secondary | ICD-10-CM | POA: Diagnosis not present

## 2023-04-23 DIAGNOSIS — E119 Type 2 diabetes mellitus without complications: Secondary | ICD-10-CM | POA: Diagnosis not present

## 2023-04-23 DIAGNOSIS — F419 Anxiety disorder, unspecified: Secondary | ICD-10-CM | POA: Diagnosis not present

## 2023-04-23 DIAGNOSIS — Z880 Allergy status to penicillin: Secondary | ICD-10-CM | POA: Diagnosis not present

## 2023-04-23 MED ORDER — HYDROXYZINE HCL 25 MG TABLET
ORAL_TABLET | Freq: Four times a day (QID) | ORAL | 0 refills | 3.00 days | Status: CP
Start: 2023-04-23 — End: 2023-04-26

## 2023-04-23 MED ADMIN — hydrOXYzine (ATARAX) tablet 25 mg: 25 mg | ORAL | @ 23:00:00 | Stop: 2023-04-23

## 2023-04-23 MED ADMIN — famotidine (PEPCID) tablet 20 mg: 20 mg | ORAL | @ 23:00:00 | Stop: 2023-04-23

## 2023-04-23 MED ADMIN — oxyCODONE (ROXICODONE) immediate release tablet 2.5 mg: 2.5 mg | ORAL | Stop: 2023-04-23

## 2023-04-23 MED ADMIN — acetaminophen (TYLENOL) tablet 1,000 mg: 1000 mg | ORAL | @ 23:00:00 | Stop: 2023-04-23

## 2023-04-23 NOTE — Unmapped (Signed)
 Surgery on Friday and had a rash on the entire torso and back area starting Saturday morning. Surgeon states it could've been the iodine used.  Patient appears uncomfortable and fidgety.  Took 100mg  of Benadryl around 1300 without relief. Unable to take steroids due to immune system.  Endorses itching in her throat, raspy voice, and difficulty swallowing.

## 2023-04-23 NOTE — Unmapped (Signed)
 [facility]  Emergency Department Provider Note     HPI     Melissa Pearson is a very pleasant 33 y.o. female with a past medical history of hidradenitis suppurativa s/p right axillary and central chest excisions (02/14), PE (07/19/21), opioid use disorder, bipolar disorder, anxiety, and depression who presents with rash/possible allergic reaction. The patient reports that 4 days ago, she underwent an excision of her right axillary and central chest wall hidradenitis. She states that the following morning, she noticed a diffuse rash across her entire torso and back. At present, her mother notes that it has now spread to her chest and neck. The patient states that today she began to have episodes of hematemesis, mild SOB, throat swelling, and dysphagia. She reports taking 100 mg total of benadryl (2 this morning and 2 in the afternoon). She states that she does not take steroids d/t her HS. She was not discharged with antibiotics after her surgery. She states that her surgeon informed her that her symptoms could be the result of the iodine used during the procedure. The patient denies having a reaction to iodine in the past. She denies any other exposures. Upon interview, she states that she does not no the location of her Epipen at home. She states that her BP systolic is normally ~110. She denies any discharge from her surgical site. She denies fever of chills.       MDM     BP 101/72  - Pulse 88  - Resp 15  - LMP 03/19/2023 (Within Weeks)  - SpO2 98%      Vital signs are notable for hypotension to 97/60 but otherwise within normal limits. On exam, patient is overall well-appearing in no acute distress. Physical exam revealed regular rate and rhythm, no increased work of breathing abdomen nondistended 2+ pulses in distal extremities, no leg swelling.  Diffuse maculopapular rash with erythema, blanching. Ddx: Expect likely allergic reaction in the setting of patient's iodine, I have a low suspicion of anaphylactic reaction, additionally low suspicion of infection, I did review patient's postsurgical wounds, no signs of infection, surrounding erythema, discharge.  Diagnostic workup as below. Disposition pending further work-up. See progress notes below.     No orders of the defined types were placed in this encounter.      ED Course as of 04/23/23 2335   Tue Apr 23, 2023   1610 Reevaluation, patient feels mildly improved, states that the Atarax did help, will provide additional dose of Atarax and reevaluation expect likely discharge.   2057 On reevaluation, patient feels well, will plan for discharge with Atarax prescription, recommended topical treatments, she is agreeable plans for discharge.       The case was discussed with the attending physician who is in agreement with the above assessment and plan.    - Any discussion of this patient's case/presentation between myself and consultants, admitting teams, or other team members has been documented above.  - Imaging and other studies, if performed, that were available during my care of the patient were independently reviewed and interpreted by me and considered in my medical decision making as documented above.  - External records reviewed: 04/19/23 Surgical Procedure Note - Reviewed for history of hidradenitis excisions.   - Consideration of admission, observation, transfer, or escalation of care: Yes, however patient was deemed appropriate for outpatient management, please see ED course and MDM for further clinical reasoning.     ED Clinical Impression     Final diagnoses:  Rash (Primary)        Physical Exam     Vitals:    04/23/23 1724 04/23/23 1804 04/23/23 1840 04/23/23 2000   BP: 111/75 (S) 97/60 101/70 101/72   Pulse: 100 95 88 88   Resp: 20 20 16 15    SpO2: 100% 100%  98%        Constitutional: In no acute distress.  Eyes: Conjunctivae are normal. EOMI.   HEENT: Normocephalic and atraumatic. Mucous membranes are moist.   Neck: Full active range of motion  Cardiovascular: See heart rate listed above.  Normal skin perfusion.  2+ pulses in distal extremities, no lower extremity edema.   Respiratory: See respiratory rate listed above.   Speaking easily in full sentences  Gastrointestinal: Soft, non-distended, non-tender.  No guarding.  Musculoskeletal: No long bone deformities.   Neurologic: Normal speech and language. No gross focal neurologic deficits are appreciated.   Skin: Skin is warm, dry and intact.  Psychiatric: Mood and affect are normal.     Past History     PAST MEDICAL HISTORY/PAST SURGICAL HISTORY:   Past Medical History:   Diagnosis Date    Acne     Acute sepsis (CMS-HCC)     with C DIFF AND PULM EMBOLISM       APRIL 2023    ADHD     Allergic     Anxiety disorder 06/28/2021    Last Assessment & Plan:    Formatting of this note might be different from the original.   Currently followed by psychiatry does have hydroxyzine and BuSpar.  Continue following with psychiatry as recommended and take medications as prescribed    Attention deficit hyperactivity disorder (ADHD), predominantly hyperactive type 02/11/2012    Last Assessment & Plan:    Formatting of this note might be different from the original.   Patient currently maintained on methylphenidate 20 mg daily.  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    Bipolar 1 disorder (CMS-HCC)     Bipolar 2 disorder, major depressive episode (CMS-HCC) 08/04/2019    Last Assessment & Plan:    Formatting of this note might be different from the original.   Patient is followed by Dr. Laurence Spates currently maintained on Lamictal and Abilify    Depression     Diabetes mellitus (CMS-HCC) 2022    Pre diabetic    Difficulty sleeping     Disorder of skin or subcutaneous tissue 2018    HS    Flu     had tamiflu    Headache, tension-type     Hidradenitis suppurativa 12/18/2017    History of opioid abuse (CMS-HCC)     Hydradenitis Keloid     Right ear    Kidney stones     Polysubstance abuse (CMS-HCC)     Pulmonary embolism (CMS-HCC) 07/05/2021    Last Assessment & Plan:    Formatting of this note might be different from the original.   Patient has been evaluated by oncology and has completed Eliquis 5 mg twice daily for 6 months.  07/2021 [facility]       Past Surgical History:   Procedure Laterality Date    INNER EAR SURGERY Bilateral     reconstruction of ear drum as child    NASAL SEPTUM SURGERY      PR EXC SKIN BENIG >4 CM TRUNK,ARM,LEG Midline 04/19/2023  Procedure: EXCISION OF CENTRAL CHEST WALL;  Surgeon: Harvin Hazel, MD;  Location: [facility];  Service: General Surgery    PR EXCISION HIDRADENITIS AXILLARY COMPLEX REPAIR Right 04/19/2023    Procedure: EXCISION RT AXILLA;  Surgeon: Harvin Hazel, MD;  Location: [facility];  Service: General Surgery    PR EXCISION HIDRADENITIS INGUINAL COMPLEX REPAIR Bilateral 03/09/2022    Procedure: EXCISION OF GROIN SWEAT GLANDS;  Surgeon: Harvin Hazel, MD;  Location: [facility];  Service: General Surgery    SKIN BIOPSY      TONSILLECTOMY      WISDOM TOOTH EXTRACTION         MEDICATIONS:     Current Facility-Administered Medications:     ertapenem (INVanz) injection 1 g, 1 g, Intravenous, Once, Elsie Stain, MD    povidone-iodine 10 % swab 1 Application, 1 application., Each Nare, Once, Harvin Hazel, MD    Current Outpatient Medications:     albuterol HFA 90 mcg/actuation inhaler, ProAir HFA 90 mcg/actuation aerosol inhaler, Disp: , Rfl:     azelastine (ASTELIN) 137 mcg (0.1 %) nasal spray, PLACE 1 SPRAY INTO BOTH NOSTRILS 2 (TWO) TIMES DAILY. USE IN EACH NOSTRIL AS DIRECTED (Patient not taking: Reported on 04/09/2023), Disp: , Rfl:     beclomethasone dipropionate (QVAR REDIHALER) 40 mcg/actuation inhaler, Inhale 1 puff  in the morning and 1 puff in the evening., Disp: 10.6 g, Rfl: PRN    beclomethasone dipropionate (QVAR REDIHALER) 40 mcg/actuation inhaler, Inhale 1 puff two (2) times a day., Disp: 10.6 g, Rfl: 0    Bifidobacterium infantis (ALIGN ORAL), Take by mouth., Disp: , Rfl:     bimekizumab-bkzx 160 mg/mL AtIn, Inject the contents of 2 syringes (320 mg total)  every 8 weeks., Disp: 4 mL, Rfl: 2    bimekizumab-bkzx 160 mg/mL AtIn, Inject the contents of 2 syringes (320 mg total)  under the skin every twenty-eight (28) days., Disp: 2 mL, Rfl: 4    bimekizumab-bkzx 320 mg/2 mL AtIn, Inject the contents of 1 pen (320 mg) under the skin every twenty-eight (28) days., Disp: 2 mL, Rfl: 11    buprenorphine-naloxone (SUBOXONE) 8-2 mg sublingual tablet, Place 1 tablet (8 mg of buprenorphine total) under the tongue two (2) times a day., Disp: 60 tablet, Rfl: 1    diphenhydrAMINE (BENADRYL) 25 mg capsule/tablet, Take 1 each (25 mg total) by mouth every six (6) hours as needed for itching., Disp: , Rfl:     docusate sodium (COLACE) 100 MG capsule, Take 1 capsule (100 mg total) by mouth two (2) times a day for 14 days., Disp: 28 capsule, Rfl: 0    doxycycline (VIBRA-TABS) 100 MG tablet, TAKE 1 TABLET BY MOUTH 2X DAILY FOR 7-10 DAYS AS NEEDED FOR FLARES, Disp: 60 tablet, Rfl: 0    empty container Misc, Use as directed to dispose of Cosentyx pens. (Patient not taking: Reported on 04/09/2023), Disp: 1 each, Rfl: 2    EPINEPHrine (EPIPEN) 0.3 mg/0.3 mL injection, INJECTAS DIRECTED AS NEEDED FOR SYSTEMIC REACTIONS, Disp: , Rfl: 1    ergocalciferol-1,250 mcg, 50,000 unit, (DRISDOL) 1,250 mcg (50,000 unit) capsule, Take 1 capsule (1,250 mcg total) by mouth once a week., Disp: , Rfl:     erythromycin (ROMYCIN) 5 mg/gram (0.5 %) ophthalmic ointment, , Disp: , Rfl:     fluticasone propion-salmeterol (ADVAIR HFA) 115-21 mcg/actuation inhaler, Inhale 2 puffs two (2) times a day. (Patient not taking: Reported on 02/19/2023), Disp: 12 g, Rfl:  0    HYDROcodone-acetaminophen (NORCO) 5-325 mg per tablet, Take 1 tablet by mouth every four (4) hours as needed for pain for up to 5 days., Disp: 8 tablet, Rfl: 0    hydrOXYzine (ATARAX) 25 MG tablet, Take 1 tablet (25 mg total) by mouth every eight (8) hours as needed for itching, allergies or anxiety. (Patient not taking: Reported on 04/19/2023), Disp: 20 tablet, Rfl: 0    hydrOXYzine (ATARAX) 25 MG tablet, Take 1 tablet (25 mg total) by mouth every six (6) hours for 3 days., Disp: 12 tablet, Rfl: 0    infliximab (REMICADE IV), Remicade, Disp: , Rfl:     iron,carbonyl-vitamin C 100-250 mg Tab, Take 1 tablet by mouth daily., Disp: , Rfl:     meloxicam (MOBIC) 7.5 MG tablet, Take 1 tablet (7.5 mg total) by mouth two (2) times a day as needed for pain. Take with food. (Patient not taking: Reported on 04/09/2023), Disp: 60 tablet, Rfl: 5    methylphenidate HCl (RITALIN) 20 MG tablet, Take 1 tablet (20 mg total) by mouth daily., Disp: 30 tablet, Rfl: 0    mometasone (ELOCON) 0.1 % solution, Apply topically daily., Disp: , Rfl:     montelukast (SINGULAIR) 10 mg tablet, TAKE 1 TABLET AT BEDTIME, Disp: , Rfl:     naloxone (NARCAN) 4 mg nasal spray, One spray in either nostril once for known/suspected opioid overdose. May repeat every 2-3 minutes in alternating nostril til EMS arrives, Disp: 2 each, Rfl: 3    norethindrone (MICRONOR) 0.35 mg tablet, Take 1 tablet by mouth daily., Disp: , Rfl:     nystatin (MYCOSTATIN) 100,000 unit/gram ointment, Apply topically two (2) times a day. To yeast rash, Disp: 30 g, Rfl: 5    nystatin (MYCOSTATIN) 100,000 unit/gram powder, Apply topically., Disp: , Rfl:     ondansetron (ZOFRAN) 4 MG tablet, Take 1 tablet (4 mg total) by mouth., Disp: , Rfl:     ondansetron (ZOFRAN-ODT) 4 MG disintegrating tablet, Take 1 tablet (4 mg total) by mouth every eight (8) hours as needed for nausea for up to 5 days., Disp: 15 tablet, Rfl: 0    sumatriptan (IMITREX) 100 MG tablet, Take 1 tablet (100 mg total) by mouth., Disp: , Rfl:     topiramate (TOPAMAX) 25 MG tablet, 25 MG NIGHTLY X 7 DAYS THEN 25 MG TWICE DAILY, Disp: 180 tablet, Rfl: 1    venlafaxine (EFFEXOR) 37.5 MG tablet, Take 1 tablet (37.5 mg total) by mouth two (2) times a day., Disp: , Rfl:     ALLERGIES:   Adhesive tape-silicones, Ginger, Other, Shellfish derived, Amoxicillin-pot clavulanate, Opioids - morphine analogues, Sulfamethoxazole-trimethoprim, and Vancomycin    SOCIAL HISTORY:   Social History     Tobacco Use    Smoking status: Former     Types: e-Cigarettes    Smokeless tobacco: Never    Tobacco comments:     Use in times of extreme strees   Substance Use Topics    Alcohol use: Not Currently     Comment: 1 per month or less- RECOVERING       FAMILY HISTORY:  Family History   Problem Relation Age of Onset    Allergy (severe) Mother         Shellfish    Diabetes Mother     Hypertension Mother     Stroke Maternal Grandmother         Bell???s palsy    Seizures Maternal Grandmother     Dementia  Maternal Grandmother     Parkinsonism Maternal Grandmother     Heart attack Father     Melanoma Neg Hx     Basal cell carcinoma Neg Hx     Squamous cell carcinoma Neg Hx          Radiology     No orders to display        Laboratory Data     Lab Results   Component Value Date    WBC 10.7 04/15/2023    HGB 12.6 04/15/2023    HCT 37.2 04/15/2023    PLT 313 04/15/2023       Lab Results   Component Value Date    BUN 9 04/15/2023    CREATININE 0.75 04/15/2023       Lab Results   Component Value Date    ALT 11 04/15/2023    AST 17 04/15/2023       No results found for: LABPROT, INR, APTT    Hollie Beach, DO  PGY2 EM    Portions of this record have been created using Scientist, clinical (histocompatibility and immunogenetics). Dictation errors have been sought, but may not have been identified and corrected.    Documentation assistance was provided by Humberto Seals, Scribe on April 23, 2023 at 6:14 PM for [facility], DO.     Documentation assistance was provided by the scribe in my presence.  The documentation recorded by the scribe has been reviewed by me and accurately reflects the services I personally performed. Edits were made as necessary.    Hollie Beach, DO              Alan Ripper, [place]  Resident  04/23/23 [phone]

## 2023-04-23 NOTE — Unmapped (Signed)
 Bed: 09  Expected date:   Expected time:   Means of arrival:   Comments:  Melissa Pearson

## 2023-04-24 MED ADMIN — hydrOXYzine (ATARAX) tablet 25 mg: 25 mg | ORAL | @ 01:00:00 | Stop: 2023-04-23

## 2023-04-25 ENCOUNTER — Other Ambulatory Visit (HOSPITAL_COMMUNITY): Payer: Self-pay

## 2023-04-25 DIAGNOSIS — L732 Hidradenitis suppurativa: Principal | ICD-10-CM

## 2023-04-25 DIAGNOSIS — H6522 Chronic serous otitis media, left ear: Secondary | ICD-10-CM | POA: Diagnosis not present

## 2023-04-25 DIAGNOSIS — H60333 Swimmer's ear, bilateral: Secondary | ICD-10-CM | POA: Diagnosis not present

## 2023-04-25 MED ORDER — NEOMYCIN-POLYMYXIN-HC 3.5-10000-1 OT SUSP
3.0000 [drp] | Freq: Three times a day (TID) | OTIC | 1 refills | Status: DC
  Filled 2023-04-25: qty 10, 10d supply, fill #0

## 2023-04-25 MED ORDER — DOXYCYCLINE HYCLATE 100 MG TABLET
ORAL_TABLET | 2 refills | 0.00 days | Status: CP
Start: 2023-04-25 — End: ?

## 2023-04-25 NOTE — Unmapped (Signed)
 Prescription refill request for doxycycline, Last office visit was 04/09/23      Please Advise

## 2023-04-26 ENCOUNTER — Other Ambulatory Visit (HOSPITAL_COMMUNITY): Payer: Self-pay

## 2023-04-26 NOTE — Unmapped (Signed)
 Opened in error

## 2023-05-06 ENCOUNTER — Other Ambulatory Visit (HOSPITAL_COMMUNITY): Payer: Self-pay

## 2023-05-06 DIAGNOSIS — H60333 Swimmer's ear, bilateral: Secondary | ICD-10-CM | POA: Diagnosis not present

## 2023-05-06 MED ORDER — CLINDAMYCIN HCL 300 MG PO CAPS
300.0000 mg | ORAL_CAPSULE | Freq: Three times a day (TID) | ORAL | 0 refills | Status: DC
  Filled 2023-05-06: qty 30, 10d supply, fill #0

## 2023-05-07 NOTE — Unmapped (Signed)
 The [facility] has made a third and final attempt to reach this patient to refill the following medication: Bimzelx.      We have left voicemails on the following phone numbers: [phone] (2/21, 2/26), have left voicemail with patient (2/11) at the following phone numbers: [phone], have sent a MyChart message, and have sent a text message to the following phone numbers: [phone] .    Dates contacted: 2/11, 2/21, 2/26  Last scheduled delivery: 12/19 for 8 week supply - medication has now been increased to every 4 weeks. Need patient to enroll in copay card to lower copay from 7988.94.     The patient may be at risk of non-compliance with this medication. The patient should call the [facility] at [phone]  Option 4, then Option 2: Dermatology, Gastroenterology, Rheumatology to refill medication.    Andric Kerce [facility] Specialty Pharmacist

## 2023-05-11 DIAGNOSIS — S0502XA Injury of conjunctiva and corneal abrasion without foreign body, left eye, initial encounter: Secondary | ICD-10-CM | POA: Diagnosis not present

## 2023-05-11 DIAGNOSIS — Z87828 Personal history of other (healed) physical injury and trauma: Secondary | ICD-10-CM | POA: Diagnosis not present

## 2023-05-13 ENCOUNTER — Ambulatory Visit: Admit: 2023-05-13 | Discharge: 2023-05-14 | Payer: BLUE CROSS/BLUE SHIELD

## 2023-05-13 ENCOUNTER — Encounter: Admit: 2023-05-13 | Discharge: 2023-05-14 | Payer: BLUE CROSS/BLUE SHIELD

## 2023-05-13 DIAGNOSIS — L732 Hidradenitis suppurativa: Secondary | ICD-10-CM | POA: Diagnosis not present

## 2023-05-13 MED ADMIN — diphenhydrAMINE (BENADRYL) capsule/tablet 25 mg: 25 mg | ORAL | @ 13:00:00 | Stop: 2023-05-13

## 2023-05-13 MED ADMIN — cetirizine (ZYRTEC) tablet 10 mg: 10 mg | ORAL | @ 13:00:00 | Stop: 2023-05-13

## 2023-05-13 MED ADMIN — inFLIXimab-axxq (AVSOLA) 10 mg/kg = 800 mg in sodium chloride (NS) 250 mL IVPB: 10 mg/kg | INTRAVENOUS | @ 14:00:00 | Stop: 2023-05-13

## 2023-05-13 MED ADMIN — acetaminophen (TYLENOL) tablet 650 mg: 650 mg | ORAL | @ 13:00:00 | Stop: 2023-05-13

## 2023-05-13 MED ADMIN — ondansetron (ZOFRAN) injection 8 mg: 8 mg | INTRAVENOUS | @ 13:00:00 | Stop: 2023-05-13

## 2023-05-13 NOTE — Unmapped (Signed)
 Patient presents for standard Avsola (infliximab-axxq) infusion.  In no acute distress. Vitals stable.  Reports no new medical issues or S/S of infection.  PIV placed.  See MAR for premeds. Future appointments made.     0932 Avsola (infliximab-axxq) 800 mg infusing as follows:     75ml/hr x 15 min  76ml/hr x 15 min  35ml/hr x 15 min  78ml/hr x 15 min  158ml/hr x 30 min  264ml/hr for the remainder of the infusion.    1137 Avsola (infliximab-axxq) infusion complete.  PIV flushed with NS.  Vitals stable.  Patient without any s/s of adverse reaction.  1140 IV d/c'd.  Patient discharged from Infusion Center in no apparent distress.

## 2023-05-13 NOTE — Unmapped (Signed)
 I spoke with Melissa [facility] to review the change to 2 ml syringe (only need to inject 1 now), as well as the dose change to injecting every 4 weeks.     She reports that Bimzelx has been helpful for her HS flares and she's seen no adverse effects. She remains on infliximab as well.     [facility] Clinical Assessment & Refill Coordination Note    Melissa [facility], DOB: 1990/04/01  Phone: [phone] (home) [phone] (work)    All above HIPAA information was verified with patient.     Was a Nurse, learning disability used for this call? No    Specialty Medication(s):   Inflammatory Disorders: Bimzelx     Current Outpatient Medications   Medication Sig Dispense Refill    albuterol HFA 90 mcg/actuation inhaler ProAir HFA 90 mcg/actuation aerosol inhaler      azelastine (ASTELIN) 137 mcg (0.1 %) nasal spray PLACE 1 SPRAY INTO BOTH NOSTRILS 2 (TWO) TIMES DAILY. USE IN EACH NOSTRIL AS DIRECTED (Patient not taking: Reported on 04/09/2023)      beclomethasone dipropionate (QVAR REDIHALER) 40 mcg/actuation inhaler Inhale 1 puff  in the morning and 1 puff in the evening. 10.6 g PRN    beclomethasone dipropionate (QVAR REDIHALER) 40 mcg/actuation inhaler Inhale 1 puff two (2) times a day. 10.6 g 0    Bifidobacterium infantis (ALIGN ORAL) Take by mouth.      bimekizumab-bkzx 160 mg/mL AtIn Inject the contents of 2 syringes (320 mg total)  every 8 weeks. 4 mL 2    bimekizumab-bkzx 160 mg/mL AtIn Inject the contents of 2 syringes (320 mg total)  under the skin every twenty-eight (28) days. 2 mL 4    bimekizumab-bkzx (BIMZELX) 320 mg/2 mL subcutaneous injection Inject the contents of 1 syringe (320 mg) under the skin every twenty-eight (28) days. 2 mL 11    buprenorphine-naloxone (SUBOXONE) 8-2 mg sublingual tablet Place 1 tablet (8 mg of buprenorphine total) under the tongue two (2) times a day. 60 tablet 1    diphenhydrAMINE (BENADRYL) 25 mg capsule/tablet Take 1 each (25 mg total) by mouth every six (6) hours as needed for itching.      doxycycline (VIBRA-TABS) 100 MG tablet TAKE 1 TABLET BY MOUTH 2 TIMES DAILY FOR 7-10 DAYS AS NEEDED FOR FLARES 60 tablet 2    empty container Misc Use as directed to dispose of Cosentyx pens. (Patient not taking: Reported on 04/09/2023) 1 each 2    EPINEPHrine (EPIPEN) 0.3 mg/0.3 mL injection INJECTAS DIRECTED AS NEEDED FOR SYSTEMIC REACTIONS  1    ergocalciferol-1,250 mcg, 50,000 unit, (DRISDOL) 1,250 mcg (50,000 unit) capsule Take 1 capsule (1,250 mcg total) by mouth once a week.      erythromycin (ROMYCIN) 5 mg/gram (0.5 %) ophthalmic ointment       fluticasone propion-salmeterol (ADVAIR HFA) 115-21 mcg/actuation inhaler Inhale 2 puffs two (2) times a day. (Patient not taking: Reported on 02/19/2023) 12 g 0    hydrOXYzine (ATARAX) 25 MG tablet Take 1 tablet (25 mg total) by mouth every eight (8) hours as needed for itching, allergies or anxiety. (Patient not taking: Reported on 04/19/2023) 20 tablet 0    infliximab (REMICADE IV) Remicade      iron,carbonyl-vitamin C 100-250 mg Tab Take 1 tablet by mouth daily.      meloxicam (MOBIC) 7.5 MG tablet Take 1 tablet (7.5 mg total) by mouth two (2) times a day as needed for pain. Take with food. (Patient not taking:  Reported on 04/09/2023) 60 tablet 5    methylphenidate HCl (RITALIN) 20 MG tablet Take 1 tablet (20 mg total) by mouth daily. 30 tablet 0    mometasone (ELOCON) 0.1 % solution Apply topically daily.      montelukast (SINGULAIR) 10 mg tablet TAKE 1 TABLET AT BEDTIME      naloxone (NARCAN) 4 mg nasal spray One spray in either nostril once for known/suspected opioid overdose. May repeat every 2-3 minutes in alternating nostril til EMS arrives 2 each 3    norethindrone (MICRONOR) 0.35 mg tablet Take 1 tablet by mouth daily.      nystatin (MYCOSTATIN) 100,000 unit/gram ointment Apply topically two (2) times a day. To yeast rash 30 g 5    nystatin (MYCOSTATIN) 100,000 unit/gram powder Apply topically.      ondansetron (ZOFRAN) 4 MG tablet Take 1 tablet (4 mg total) by mouth.      sumatriptan (IMITREX) 100 MG tablet Take 1 tablet (100 mg total) by mouth.      topiramate (TOPAMAX) 25 MG tablet 25 MG NIGHTLY X 7 DAYS THEN 25 MG TWICE DAILY 180 tablet 1    venlafaxine (EFFEXOR) 37.5 MG tablet Take 1 tablet (37.5 mg total) by mouth two (2) times a day.       Current Facility-Administered Medications   Medication Dose Route Frequency Provider Last Rate Last Admin    ertapenem Pincus Sanes) injection 1 g  1 g Intravenous Once Elsie Stain, MD        povidone-iodine 10 % swab 1 Application  1 application. Each Nare Once Harvin Hazel, MD            Changes to medications: Raelin reports no changes at this time.    Medication list has been reviewed and updated in Epic: Yes    Allergies   Allergen Reactions    Adhesive Tape-Silicones Hives     Pt can only tolerate cloth tape    Ginger Anaphylaxis    Other Hives     ABD pad causes rash  Pt can only tolerate cloth tape  Pt can only tolerate cloth tape    Shellfish Derived Anaphylaxis, Hives and Shortness Of Breath    Amoxicillin-Pot Clavulanate Headache    Opioids - Morphine Analogues       I become very angry    Sulfamethoxazole-Trimethoprim Other (See Comments)     Causes Oral Thrush    Vancomycin Other (See Comments)     Burns her vein really bad and always causes them to blow.       Changes to allergies: No    Allergies have been reviewed and updated in Epic: Yes    SPECIALTY MEDICATION ADHERENCE     Bimzelx - 0 left  Medication Adherence    Patient reported X missed doses in the last month: 0  Specialty Medication: Bimzelx          Specialty medication(s) dose(s) confirmed:  Dose increase with this fill to every 4 weeks      Are there any concerns with adherence? No    Adherence counseling provided? Not needed    CLINICAL MANAGEMENT AND INTERVENTION      Clinical Benefit Assessment:    Do you feel the medicine is effective or helping your condition? Yes    Clinical Benefit counseling provided? Not needed    Adverse Effects Assessment:    Are you experiencing any side effects? No    Are you experiencing difficulty administering your medicine?  No    Quality of Life Assessment:    Quality of Life    Rheumatology  Oncology  Dermatology  1. What impact has your specialty medication had on the symptoms of your skin condition (i.e. itchiness, soreness, stinging)?: Some  2. What impact has your specialty medication had on your comfort level with your skin?: Some  Cystic Fibrosis          How many days over the past month did your HS,psoriasis  keep you from your normal activities? For example, brushing your teeth or getting up in the morning. Patient declined to answer    Have you discussed this with your provider? Not needed    Acute Infection Status:    Acute infections noted within Epic:  No active infections    Patient reported infection: None    Therapy Appropriateness:    Is therapy appropriate based on current medication list, adverse reactions, adherence, clinical benefit and progress toward achieving therapeutic goals? Yes, therapy is appropriate and should be continued     Clinical Intervention:    Was an intervention completed as part of this clinical assessment? No    DISEASE/MEDICATION-SPECIFIC INFORMATION      For patients on injectable medications: Patient currently has 0 doses left.  Next injection is scheduled for asap, will take 3/12.    Chronic Inflammatory Diseases: Have you experienced any flares in the last month? No  Has this been reported to your provider? No    PATIENT SPECIFIC NEEDS     Does the patient have any physical, cognitive, or cultural barriers? No    Is the patient high risk? No    Does the patient require physician intervention or other additional services (i.e., nutrition, smoking cessation, social work)? No    Does the patient have an additional or emergency contact listed in their chart? Yes    SOCIAL DETERMINANTS OF HEALTH     At the [facility], we have learned that life circumstances - like trouble affording food, housing, utilities, or transportation can affect the health of many of our patients.   That is why we wanted to ask: are you currently experiencing any life circumstances that are negatively impacting your health and/or quality of life? Patient declined to answer    Social Drivers of Health     Food Insecurity: No Food Insecurity (01/02/2023)    Received from [facility]    Hunger Vital Sign     Worried About Running Out of Food in the Last Year: Never true     Ran Out of Food in the Last Year: Never true   Tobacco Use: Medium Risk (05/13/2023)    Patient History     Smoking Tobacco Use: Former     Smokeless Tobacco Use: Never     Passive Exposure: Not on file   Transportation Needs: No Transportation Needs (01/02/2023)    Received from [facility] - Transportation     Lack of Transportation (Medical): No     Lack of Transportation (Non-Medical): No   Alcohol Use: Not At Risk (01/22/2023)    Alcohol Use     How often do you have a drink containing alcohol?: Monthly or less     How many drinks containing alcohol do you have on a typical day when you are drinking?: 1 - 2     How often do you have 5 or more drinks on one occasion?: Never   Housing: Low Risk  (  01/22/2023)    Housing     Within the past 12 months, have you ever stayed: outside, in a car, in a tent, in an overnight shelter, or temporarily in someone else's home (i.e. couch-surfing)?: No     Are you worried about losing your housing?: No   Physical Activity: Not on file   Utilities: Low Risk  (01/22/2023)    Utilities     Within the past 12 months, have you been unable to get utilities (heat, electricity) when it was really needed?: No   Stress: Not on file   Interpersonal Safety: Not on file   Substance Use: High Risk (01/22/2023)    Substance Use     In the past year, how often have you used prescription drugs for non-medical reasons?: Never     In the past year, how often have you used illegal drugs?: Daily or Almost Daily     In the past year, have you used any substance for non-medical reasons?: Yes   Intimate Partner Violence: Not At Risk (01/02/2023)    Received from [facility]    Humiliation, Afraid, Rape, and Kick questionnaire     Fear of Current or Ex-Partner: No     Emotionally Abused: No     Physically Abused: No     Sexually Abused: No   Social Connections: Unknown (07/02/2021)    Received from [facility], [facility]    Social Network     Social Network: Not on file   Financial Resource Strain: Low Risk  (01/22/2023)    Overall Financial Resource Strain (CARDIA)     Difficulty of Paying Living Expenses: Not hard at all   Depression: At risk (01/22/2023)    PHQ-2     PHQ-2 Score: 6   Internet Connectivity: Not on file   Health Literacy: Low Risk  (01/22/2023)    Health Literacy     : Never       Would you be willing to receive help with any of the needs that you have identified today? Not applicable       SHIPPING     Specialty Medication(s) to be Shipped:   Inflammatory Disorders: Bimzelx    Other medication(s) to be shipped: No additional medications requested for fill at this time     Changes to insurance: No    Cost and Payment: Patient has a copay of $5. They are aware and have authorized the pharmacy to charge the credit card on file.    Delivery Scheduled: Yes, Expected medication delivery date: 3/12.     Medication will be delivered via UPS to the confirmed prescription address in [facility].    The patient will receive a drug information handout for each medication shipped and additional FDA Medication Guides as required.  Verified that patient has previously received a Conservation officer, historic buildings and a Surveyor, mining.    The patient or caregiver noted above participated in the development of this care plan and knows that they can request review of or adjustments to the care plan at any time.      All of the patient's questions and concerns have been addressed.    Coady Train [facility] Specialty Pharmacist

## 2023-05-16 MED ORDER — METHYLPHENIDATE 20 MG TABLET
ORAL_TABLET | Freq: Every day | ORAL | 0 refills | 30.00 days
Start: 2023-05-16 — End: ?

## 2023-05-20 MED ORDER — METHYLPHENIDATE 20 MG TABLET
ORAL_TABLET | Freq: Every day | ORAL | 0 refills | 30.00 days | Status: CP
Start: 2023-05-20 — End: ?

## 2023-05-29 ENCOUNTER — Ambulatory Visit
Admission: EM | Admit: 2023-05-29 | Discharge: 2023-05-29 | Disposition: A | Discharge status: Home / Self Care | Attending: Family Medicine | Admitting: Family Medicine

## 2023-05-29 DIAGNOSIS — S01531A Puncture wound without foreign body of lip, initial encounter: Secondary | ICD-10-CM | POA: Diagnosis not present

## 2023-05-29 DIAGNOSIS — L089 Local infection of the skin and subcutaneous tissue, unspecified: Secondary | ICD-10-CM | POA: Diagnosis not present

## 2023-05-29 DIAGNOSIS — R220 Localized swelling, mass and lump, head: Secondary | ICD-10-CM | POA: Diagnosis not present

## 2023-05-29 DIAGNOSIS — Z23 Encounter for immunization: Secondary | ICD-10-CM | POA: Diagnosis not present

## 2023-05-29 MED ORDER — CHLORHEXIDINE GLUCONATE 0.12 % MT SOLN: 15.0000 mL | Freq: Two times a day (BID) | OROMUCOSAL | 0 refills | Status: DC

## 2023-05-29 MED ORDER — DOXYCYCLINE HYCLATE 100 MG PO CAPS: 100.0000 mg | ORAL_CAPSULE | Freq: Two times a day (BID) | ORAL | 0 refills | Status: DC

## 2023-05-29 MED ORDER — TETANUS-DIPHTH-ACELL PERTUSSIS 5-2.5-18.5 LF-MCG/0.5 IM SUSY
0.5000 mL | PREFILLED_SYRINGE | Freq: Once | INTRAMUSCULAR | Status: AC
  Administered 2023-05-29: 0.5 mL via INTRAMUSCULAR

## 2023-05-29 MED ORDER — LIDOCAINE VISCOUS HCL 2 % MT SOLN: OROMUCOSAL | 0 refills | Status: DC

## 2023-05-29 NOTE — Discharge Instructions (Signed)
 Use the mouth rinse and lidocaine solution for pain as needed.  Take the full course of antibiotics.  Ice off-and-on to help with swelling.  Hopefully once the swelling comes down you will be able to remove both of the lip piercings, however if not improving or still unable to remove return for recheck

## 2023-05-29 NOTE — ED Provider Notes (Signed)
 [facility]    CSN: [id number] Arrival date & time: 05/29/23  1345      History   Chief Complaint No chief complaint on file.   HPI Courtney [name] is a 33 y.o. female.   Patient presenting today with progressively worsening pain, swelling, thick yellow drainage from to lower lip piercings that were done last week.  Denies fever, chills, nausea, vomiting, body aches.  Has been cleaning the areas as instructed by the piercing salon.  Unsure when last tetanus shot was, thinks it was more than 5 years ago.    Past Medical History:  Diagnosis Date   ADHD (attention deficit hyperactivity disorder)    Bipolar disorder (HCC)    Chronic tonsillitis 08/2013   current strep, will finish antibiotic 09/04/2013; snores during sleep, mother denies apnea   Heart murmur    "when I was born; outgrew it; I was a preemie"   Hidradenitis    Hidradenitis 2017   Migraine 12/19/2016   "I've just had this one for 3 days" (12/20/2016)   Pulmonary emboli (HCC) 07/02/2021   BL   Septic shock (HCC) 07/02/2021    Patient Active Problem List   Diagnosis Date Noted   Polypharmacy 01/17/2023   Abnormal urinalysis 01/17/2023   History of substance abuse (HCC) 01/17/2023   Hospital discharge follow-up 01/17/2023   Acute cystitis 01/02/2023   Left ureteral stone 01/02/2023   CAP (community acquired pneumonia) 01/02/2023   Polysubstance abuse (HCC) 01/02/2023   Methamphetamine use (HCC) 11/01/2022   Otitis externa of both ears 05/16/2022   Chronic rhinitis 02/15/2022   Shellfish allergy 02/15/2022   Pulmonary embolism (HCC) 07/19/2021   Hypotension 07/11/2021   Trichomonas vaginitis 07/05/2021   Acute metabolic encephalopathy 07/05/2021   Bilateral pulmonary embolism (HCC) 07/05/2021   Anxiety disorder 06/28/2021   Prediabetes 05/25/2021   Allergy to adhesive tape 05/25/2021   Hidradenitis suppurativa 05/23/2021   Insomnia 03/27/2021   Anemia 12/21/2020   Hypoglycemia 11/17/2020    Bipolar disorder, in full remission, most recent episode mixed (HCC) 10/07/2020   Bipolar disorder, in full remission, most recent episode depressed (HCC) 03/03/2020   Bipolar 2 disorder, major depressive episode (HCC) 08/04/2019   At risk for long QT syndrome 08/04/2019   Obesity (BMI 35.0-39.9 without comorbidity) 03/19/2019   Reactive airway disease 02/11/2019   Conductive hearing loss 03/13/2018   Axillary hidradenitis suppurativa 04/03/2017   Preventative health care 12/06/2016   Vitamin D deficiency 10/18/2014   Lethargy 07/21/2014   Attention deficit hyperactivity disorder (ADHD), predominantly hyperactive type 02/11/2012    Past Surgical History:  Procedure Laterality Date   ADENOIDECTOMY N/A 10/15/2022   Procedure: ADENOIDECTOMY;  Surgeon: Serena Colonel, MD;  Location: [facility];  Service: ENT;  Laterality: N/A;   AXILLARY HIDRADENITIS EXCISION     AXILLARY LYMPH NODE BIOPSY Left 01/06/2019   CYSTOSCOPY W/ URETERAL STENT PLACEMENT Right 05/28/2017   Procedure: CYSTOSCOPY WITH RETROGRADE PYELOGRAM/ RIGHT URETERAL STENT PLACEMENT;  Surgeon: Crist Fat, MD;  Location: [facility];  Service: Urology;  Laterality: Right;   HYDRADENITIS EXCISION Bilateral 04/03/2017   Procedure: EXCISION AND DRAINAGE BILATERAL  HIDRADENITIS AXILLA;  Surgeon: Griselda Miner, MD;  Location[facility];  Service: General;  Laterality: Bilateral;   INCISION AND DRAINAGE ABSCESS Right 07/20/2016   Procedure: INCISION AND DRAINAGE ABSCESS right axilla;  Surgeon: Abigail Miyamoto, MD;  Location: [facility] OR;  Service: General;  Laterality: Right;   INTRAUTERINE DEVICE (IUD) INSERTION  11/2016   "  had the one in my left arm removed"   IRRIGATION AND DEBRIDEMENT ABSCESS Right 12/21/2016   Procedure: IRRIGATION AND DEBRIDEMENT RIGHT AXILLARY HIDRADENITIS;  Surgeon: Andria Meuse, MD;  Location: [facility] OR;  Service: General;  Laterality: Right;   IRRIGATION AND DEBRIDEMENT ABSCESS Left 01/08/2017    Procedure: IRRIGATION AND DEBRIDEMENT AXILLARY ABSCESS;  Surgeon: Griselda Miner, MD;  Location: [facility] OR;  Service: General;  Laterality: Left;   NASAL SEPTOPLASTY W/ TURBINOPLASTY Bilateral 02/25/2019   Procedure: NASAL SEPTOPLASTY WITH TURBINATE REDUCTION;  Surgeon: Serena Colonel, MD;  Location: [facility];  Service: ENT;  Laterality: Bilateral;   TONSILLECTOMY Bilateral 09/14/2013   Procedure: BILATERAL TONSILLECTOMY;  Surgeon: Serena Colonel, MD;  Location: [facility];  Service: ENT;  Laterality: Bilateral;   TYMPANOPLASTY Bilateral    "rebuilt eardrums"   WISDOM TOOTH EXTRACTION      OB History   No obstetric history on file.      Home Medications    Prior to Admission medications   Medication Sig Start Date End Date Taking? Authorizing Provider  chlorhexidine (PERIDEX) 0.12 % solution Use as directed 15 mLs in the mouth or throat 2 (two) times daily. 05/29/23  Yes Particia Nearing, PA-C  doxycycline (VIBRAMYCIN) 100 MG capsule Take 1 capsule (100 mg total) by mouth 2 (two) times daily. 05/29/23  Yes Particia Nearing, PA-C  lidocaine (XYLOCAINE) 2 % solution May soak a cotton swab and set it onto the area of pain every 3 hours as needed 05/29/23  Yes Particia Nearing, PA-C  albuterol (VENTOLIN HFA) 108 (90 Base) MCG/ACT inhaler TAKE 2 PUFFS BY MOUTH EVERY 6 HOURS AS NEEDED FOR WHEEZE OR SHORTNESS OF BREATH 07/05/22   Eden Emms, NP  alum & mag hydroxide-simeth (MYLANTA MAXIMUM STRENGTH) 400-400-40 MG/5ML suspension Take 15 mLs by mouth every 6 (six) hours as needed for indigestion. 02/24/23   Leath-Warren, Sadie Haber, NP  Azelastine HCl 137 MCG/SPRAY SOLN     [provider]  bifidobacterium infantis (ALIGN) capsule Take 1 capsule by mouth daily.    [provider]  BIMZELX 160 MG/ML pen Inject into the skin. 05/15/22   [provider]  buprenorphine-naloxone (SUBOXONE) 2-0.5 mg SUBL SL tablet Place 1 tablet under the  tongue.    [provider]  clindamycin (CLEOCIN) 300 MG capsule Take 1 capsule (300 mg total) by mouth 3 (three) times daily for 10 days. 05/06/23     diphenhydrAMINE (BENADRYL) 25 mg capsule 25 mg every 6 (six) hours as needed.    [provider]  EPINEPHrine (EPIPEN 2-PAK) 0.3 mg/0.3 mL IJ SOAJ injection Inject 0.3 mg into the muscle as needed for anaphylaxis. 11/01/22   Eden Emms, NP  glucose blood (CONTOUR TEST) test strip And lancets #100 05/18/21   Romero Belling, MD  hydrOXYzine (ATARAX) 25 MG tablet TAKE 1 TABLET BY MOUTH EVERY 8 HOURS AS NEEDED FOR ITCHING. 01/16/22   Worthy Rancher B, FNP  inFLIXimab (REMICADE IV) Inject 1 Dose into the vein every 30 (thirty) days.    [provider]  inFLIXimab (REMICADE) 100 MG injection     [provider]  Iron-Vitamin C 100-250 MG TABS Take 1 tablet by mouth daily.    [provider]  meloxicam (MOBIC) 7.5 MG tablet Take 7.5 mg by mouth 2 (two) times daily as needed. 09/20/22   [provider]  methylphenidate (RITALIN) 20 MG tablet Take 1 tablet (20 mg total) by mouth  daily before breakfast. 01/03/23   Eden Emms, NP  Microlet Lancets MISC See admin instructions. 05/18/21   [provider]  mometasone (ELOCON) 0.1 % lotion Apply 1 Application topically daily.    [provider]  montelukast (SINGULAIR) 10 MG tablet TAKE 1 TABLET BY MOUTH EVERYDAY AT BEDTIME 05/16/22   Eden Emms, NP  neomycin-polymyxin-hydrocortisone (CORTISPORIN) 3.5-10000-1 OTIC suspension Place 3 drops into both ears 3 (three) times daily for 7 days 04/25/23     norethindrone (MICRONOR) 0.35 MG tablet Take 1 tablet by mouth daily. 07/19/21   [provider]  ondansetron (ZOFRAN-ODT) 4 MG disintegrating tablet Take 1 tablet (4 mg total) by mouth every 8 (eight) hours as needed. 02/24/23   Leath-Warren, Sadie Haber, NP  rizatriptan (MAXALT) 10 MG tablet See Admin Instructions. PLEASE SEE ATTACHED FOR  DETAILED DIRECTIONS    [provider]  SUMAtriptan (IMITREX) 100 MG tablet Take by mouth. 10/03/22   [provider]  topiramate (TOPAMAX) 25 MG tablet Take 25 mg by mouth 2 (two) times daily.    [provider]  triamcinolone ointment (KENALOG) 0.1 % PLEASE SEE ATTACHED FOR DETAILED DIRECTIONS    [provider]  venlafaxine (EFFEXOR) 37.5 MG tablet Take 37.5 mg by mouth See admin instructions. START WITH 1 TABLET DAILY FOR ONE WEEK, THEN INCREASE TO 1 TABLET TWICE PER DAY FOR HEADACHES. 12/24/22   [provider]  Vitamin D, Ergocalciferol, (DRISDOL) 1.25 MG (50000 UNIT) CAPS capsule Take 1 capsule (50,000 Units total) by mouth every 7 (seven) days. 12/12/22   Eden Emms, NP    Family History Family History  Problem Relation Age of Onset   Hypertension Mother    Hyperlipidemia Mother    Diabetes Mother    Hyperlipidemia Father    Heart attack Father 56   Alcohol abuse Paternal Uncle    Parkinson's disease Maternal Grandmother    Dementia Maternal Grandmother    Diabetes Maternal Grandmother    Bell's palsy Maternal Grandmother     Social History Social History   Tobacco Use   Smoking status: Never   Smokeless tobacco: Never  Vaping Use   Vaping status: Former   Quit date: 07/03/2021   Substances: CBD  Substance Use Topics   Alcohol use: Not Currently    Comment: once a month   Drug use: Not Currently    Types: Methamphetamines, Cocaine    Comment: opioids     Allergies   Iodine, Other, Shellfish allergy, Tapentadol, Tape, Bactrim [sulfamethoxazole-trimethoprim], Ginger, Morphine and codeine, and Vancomycin   Review of Systems Review of Systems Per HPI  Physical Exam Triage Vital Signs ED Triage Vitals  Encounter Vitals Group     BP 05/29/23 1425 114/74     Systolic BP Percentile --      Diastolic BP Percentile --      Pulse Rate 05/29/23 1425 (!) 103     Resp 05/29/23 1425 15     Temp 05/29/23 1425 98.3 F  (36.8 C)     Temp Source 05/29/23 1425 Oral     SpO2 05/29/23 1425 98 %     Weight --      Height --      Head Circumference --      Peak Flow --      Pain Score 05/29/23 1429 8     Pain Loc --      Pain Education --      Exclude from Growth Chart --  No data found.  Updated Vital Signs BP 114/74 (BP Location: Right Arm)   Pulse (!) 103   Temp 98.3 F (36.8 C) (Oral)   Resp 15   SpO2 98%   Visual Acuity Right Eye Distance:   Left Eye Distance:   Bilateral Distance:    Right Eye Near:   Left Eye Near:    Bilateral Near:     Physical Exam Vitals and nursing note reviewed.  Constitutional:      Appearance: Normal appearance. She is not ill-appearing.  HENT:     Head: Atraumatic.     Mouth/Throat:     Mouth: Mucous membranes are moist.     Comments: Left lower lip edematous, yellow drainage present from to left lateral lower lip piercings and both inner backs slightly embedded with open area, drainage from these areas Eyes:     Extraocular Movements: Extraocular movements intact.     Conjunctiva/sclera: Conjunctivae normal.  Cardiovascular:     Rate and Rhythm: Normal rate.  Pulmonary:     Effort: Pulmonary effort is normal.  Musculoskeletal:        General: Normal range of motion.     Cervical back: Normal range of motion and neck supple.  Skin:    General: Skin is warm and dry.  Neurological:     Mental Status: She is alert and oriented to person, place, and time.  Psychiatric:        Mood and Affect: Mood normal.        Thought Content: Thought content normal.        Judgment: Judgment normal.      UC Treatments / Results  Labs (all labs ordered are listed, but only abnormal results are displayed) Labs Reviewed - No data to display  EKG   Radiology No results found.  Procedures Procedures (including critical care time)  Medications Ordered in UC Medications  Tdap (BOOSTRIX) injection 0.5 mL (0.5 mLs Intramuscular Given 05/29/23 1552)     Initial Impression / Assessment and Plan / UC Course  I have reviewed the triage vital signs and the nursing notes.  Pertinent labs & imaging results that were available during my care of the patient were reviewed by me and considered in my medical decision making (see chart for details).     Both piercings appear to be infected with thick yellow drainage, swelling to the area.  Unable to remove the piercings today but the embedded backs are still with openings intact.  Options reviewed with patient, decided with shared decision making to work on getting infection under control in hopes that this will reduce swelling enough to be able to remove piercing studs without cutting into the area surgically.  Will treat with doxycycline, Peridex, viscous lidocaine, ice.  Return for worsening symptoms.  Tetanus updated.  Final Clinical Impressions(s) / UC Diagnoses   Final diagnoses:  Lip swelling  Infected pierced lip     Discharge Instructions      Use the mouth rinse and lidocaine solution for pain as needed.  Take the full course of antibiotics.  Ice off-and-on to help with swelling.  Hopefully once the swelling comes down you will be able to remove both of the lip piercings, however if not improving or still unable to remove return for recheck    ED Prescriptions     Medication Sig Dispense Auth. Provider   doxycycline (VIBRAMYCIN) 100 MG capsule Take 1 capsule (100 mg total) by mouth 2 (two) times daily. 20  capsule Particia Nearing, PA-C   chlorhexidine (PERIDEX) 0.12 % solution Use as directed 15 mLs in the mouth or throat 2 (two) times daily. 120 mL Particia Nearing, PA-C   lidocaine (XYLOCAINE) 2 % solution May soak a cotton swab and set it onto the area of pain every 3 hours as needed 100 mL Particia Nearing, PA-C      PDMP not reviewed this encounter.   Particia Nearing, [place] 05/29/23 1614

## 2023-05-29 NOTE — ED Triage Notes (Signed)
 Pt reports lip ring on the left side of face is imbedded in her lip. Pt noticed today, piercing was done last week, swelling to the lip is present.

## 2023-06-04 ENCOUNTER — Other Ambulatory Visit: Payer: Self-pay

## 2023-06-04 ENCOUNTER — Emergency Department
Admission: EM | Admit: 2023-06-04 | Discharge: 2023-06-04 | Disposition: A | Discharge status: Home / Self Care | Attending: Emergency Medicine | Admitting: Emergency Medicine

## 2023-06-04 ENCOUNTER — Encounter: Payer: Self-pay | Admitting: Emergency Medicine

## 2023-06-04 DIAGNOSIS — S01451A Open bite of right cheek and temporomandibular area, initial encounter: Secondary | ICD-10-CM | POA: Insufficient documentation

## 2023-06-04 DIAGNOSIS — S01531A Puncture wound without foreign body of lip, initial encounter: Secondary | ICD-10-CM

## 2023-06-04 DIAGNOSIS — K122 Cellulitis and abscess of mouth: Secondary | ICD-10-CM | POA: Insufficient documentation

## 2023-06-04 DIAGNOSIS — K130 Diseases of lips: Secondary | ICD-10-CM

## 2023-06-04 DIAGNOSIS — W4904XA Ring or other jewelry causing external constriction, initial encounter: Secondary | ICD-10-CM | POA: Diagnosis not present

## 2023-06-04 DIAGNOSIS — S00551A Superficial foreign body of lip, initial encounter: Secondary | ICD-10-CM | POA: Diagnosis not present

## 2023-06-04 MED ORDER — OXYCODONE-ACETAMINOPHEN 5-325 MG PO TABS
1.0000 | ORAL_TABLET | Freq: Once | ORAL | Status: AC
  Administered 2023-06-04: 1 via ORAL
  Filled 2023-06-04: qty 1

## 2023-06-04 MED ORDER — LIDOCAINE-EPINEPHRINE (PF) 1 %-1:200000 IJ SOLN
10.0000 mL | Freq: Once | INTRAMUSCULAR | Status: AC
  Administered 2023-06-04: 10 mL
  Filled 2023-06-04: qty 30

## 2023-06-04 MED ORDER — ONDANSETRON 8 MG PO TBDP
8.0000 mg | ORAL_TABLET | Freq: Once | ORAL | Status: AC
  Administered 2023-06-04: 8 mg via ORAL
  Filled 2023-06-04: qty 1

## 2023-06-04 MED ORDER — AMOXICILLIN-POT CLAVULANATE 875-125 MG PO TABS
1.0000 | ORAL_TABLET | Freq: Two times a day (BID) | ORAL | 0 refills | Status: AC
Start: 2023-06-04 — End: 2023-06-11

## 2023-06-04 NOTE — ED Provider Triage Note (Signed)
 Emergency Medicine Provider Triage Evaluation Note  Courtney [name] , a 33 y.o. female  was evaluated in triage.  Pt complains of lip piercing embedded in her lip and needs it to be removed.  Review of Systems  Positive: Lip pain Negative:   Physical Exam  There were no vitals taken for this visit. Gen:   Awake, no distress   Resp:  Normal effort  MSK:   Moves extremities without difficulty  Other:  Side of the piercing that is on the inside of the lip is palpable but not visible, no surrounding erythema  Medical Decision Making  Medically screening exam initiated at 4:43 PM.  Appropriate orders placed.  Courtney [name] was informed that the remainder of the evaluation will be completed by another provider, this initial triage assessment does not replace that evaluation, and the importance of remaining in the ED until their evaluation is complete.     Courtney Ali, PA-C 06/04/23 1645

## 2023-06-04 NOTE — ED Notes (Signed)
 ED Provider at bedside.

## 2023-06-04 NOTE — ED Triage Notes (Signed)
 Patient to ED via POV for lip piercing removal. Pt states she was seen at UC due to lip ring being imbedded in lip.  Swelling noted.

## 2023-06-04 NOTE — ED Provider Notes (Signed)
 [facility] Provider Note  Patient Contact: 8:15 PM (approximate)   History   Piercing Removal   HPI  Courtney [name] is a 33 y.o. female who presents the emergency department for infected left lower lip.  Patient has piercings to the left lip.  She was seen in urgent care, concerns for cellulitis existed, lip piercing was not removed at the time.  Patient reports that the back of the piercing has pulled through the mucosal surface of the inner lip and she can no longer get the piercing out.  Patient is on doxycycline at this time     Physical Exam   Triage Vital Signs: ED Triage Vitals  Encounter Vitals Group     BP 06/04/23 1644 (!) 140/58     Systolic BP Percentile --      Diastolic BP Percentile --      Pulse Rate 06/04/23 1644 78     Resp 06/04/23 1644 18     Temp 06/04/23 1644 98.4 F (36.9 C)     Temp Source 06/04/23 1644 Oral     SpO2 06/04/23 1644 97 %     Weight 06/04/23 1645 160 lb (72.6 kg)     Height 06/04/23 1645 5\' 1"  (1.549 m)     Head Circumference --      Peak Flow --      Pain Score 06/04/23 1645 7     Pain Loc --      Pain Education --      Exclude from Growth Chart --     Most recent vital signs: Vitals:   06/04/23 2003 06/04/23 2134  BP: 98/64 93/62  Pulse: 99 73  Resp: 18 18  Temp: 98.3 F (36.8 C) 98.1 F (36.7 C)  SpO2: 98% 100%     General: Alert and in no acute distress. ENT:      Ears:       Nose: No congestion/rhinnorhea.      Mouth/Throat: Mucous membranes are moist.  Patient with what appears to be an infected piercing to the left lower lip.  There is a visible ball on the external lip, the anterior stud is embedded within the mucosal tissue of the lip.  Cardiovascular:  Good peripheral perfusion Respiratory: Normal respiratory effort without tachypnea or retractions. Lungs CTAB. Musculoskeletal: Full range of motion to all extremities.  Neurologic:  No gross focal neurologic deficits are appreciated.   Skin:   No rash noted Other:   ED Results / Procedures / Treatments   Labs (all labs ordered are listed, but only abnormal results are displayed) Labs Reviewed - No data to display   EKG     RADIOLOGY    No results found.  PROCEDURES:  Critical Care performed: No  .Foreign Body Removal  Date/Time: 06/04/2023 9:51 PM  Performed by: Racheal Patches, PA-C Authorized by: Racheal Patches, PA-C  Consent: Verbal consent obtained. Risks and benefits: risks, benefits and alternatives were discussed Consent given by: patient Patient understanding: patient states understanding of the procedure being performed Imaging studies: imaging studies available Patient identity confirmed: verbally with patient Body area: skin General location: head/neck Location details: mouth Anesthesia: local infiltration  Anesthesia: Local Anesthetic: lidocaine 1% with epinephrine Anesthetic total: 2 mL  Sedation: Patient sedated: no  Patient restrained: no Patient cooperative: yes Localization method: visualized Removal mechanism: forceps, hemostat and scalpel Tendon involvement: none Depth: subcutaneous Complexity: simple 1 objects recovered. Objects recovered: Piercing Post-procedure assessment: foreign body removed  Patient tolerance: patient tolerated the procedure well with no immediate complications Comments: Patient had a ball and stud piercing in the left lower lip.  The stud/fastener was embedded within the mucosal tissue of the inner lip.  Patient had lidocaine infiltrated both externally and as well as internally in the lip.  Good anesthesia achieved.  Using an 11 blade scalpel, the ball portion of balanced diet was given gentle manipulation to localize the internal stud  Scalpel was used to make a small incision to be able to push the stud component of the piercing into the mouth.  Using hemostats, ball and stud were loosened and completely  removed.     MEDICATIONS ORDERED IN ED: Medications  lidocaine-EPINEPHrine (PF) (XYLOCAINE-EPINEPHrine) 1 %-1:200000 (PF) injection 10 mL (10 mLs Infiltration Given by Other 06/04/23 2032)  oxyCODONE-acetaminophen (PERCOCET/ROXICET) 5-325 MG per tablet 1 tablet (1 tablet Oral Given 06/04/23 2118)  ondansetron (ZOFRAN-ODT) disintegrating tablet 8 mg (8 mg Oral Given 06/04/23 2118)     IMPRESSION / MDM / ASSESSMENT AND PLAN / ED COURSE  I reviewed the triage vital signs and the nursing notes.                                 Differential diagnosis includes, but is not limited to, cellulitis, facial abscess, foreign body   Patient's presentation is most consistent with acute presentation with potential threat to life or bodily function.   Patient's diagnosis is consistent with infected piercing of the lip.  Patient presents to the emergency department with a known infection to the lip from new piercings.  Patient had 2 new piercings to the lip and 1 of which has become infected.  The edema has caused the backing of the piercing to become embedded within the mucosal tissue of the inner lip.  This was successfully removed as described above.  Given the fact that I have made a small incision within the mouth with a known lip infection I will also cover for oral flora with Augmentin, patient is on doxycycline and has reported improvement of edema and erythema and pain on the doxycycline.  No indication for admission at this time.  Concerning signs and symptoms return precautions discussed.  Otherwise follow-up primary care.. Patient is given ED precautions to return to the ED for any worsening or new symptoms.     FINAL CLINICAL IMPRESSION(S) / ED DIAGNOSES   Final diagnoses:  Piercing in middle third of lower lip  Cellulitis, lip     Rx / DC Orders   ED Discharge Orders          Ordered    amoxicillin-clavulanate (AUGMENTIN) 875-125 MG tablet  2 times daily        06/04/23 2148              Note:  This document was prepared using Dragon voice recognition software and may include unintentional dictation errors.   Lanette Hampshire 06/04/23 2356    Minna Antis, MD 06/06/23 [phone]

## 2023-06-05 ENCOUNTER — Telehealth: Payer: Self-pay

## 2023-06-05 NOTE — Transitions of Care (Post Inpatient/ED Visit) (Signed)
   06/05/2023  Name: MADESYN AST MRN: [id number] DOB: December 08, 1990  Today's TOC FU Call Status: Today's TOC FU Call Status:: Successful TOC FU Call Completed TOC FU Call Complete Date: 06/05/23 Patient's Name and Date of Birth confirmed.  Transition Care Management Follow-up Telephone Call    Items Reviewed:    Medications Reviewed Today: Medications Reviewed Today   Medications were not reviewed in this encounter     Home Care and Equipment/Supplies:    Functional Questionnaire:    Follow up appointments reviewed:   Patient has transferred to [facility] PCP   SIGNATURE Karena Addison, LPN [facility] Nurse Health Advisor Direct Dial [phone]

## 2023-06-05 NOTE — Unmapped (Signed)
 [facility] Refill Coordination Note    Melissa Pearson, Melissa Pearson: 12-08-1990  Phone: [phone] (home) [phone] (work)      All above HIPAA information was verified with patient.         05/30/2023     2:50 PM   Specialty Rx Medication Refill Questionnaire   Which Medications would you like refilled and shipped? Bimzelx Pre filled syringe (not the auto injector)   Please list all current allergies: Morphine iodine shellfish   Have you missed any doses in the last 30 days? No   Have you had any changes to your medication(s) since your last refill? No   How many days remaining of each medication do you have at home? 0   If receiving an injectable medication, next injection date is 06/04/2023   Have you experienced any side effects in the last 30 days? No   Please enter the full address (street address, city, state, zip code) where you would like your medication(s) to be delivered to. [address] , [place] [address]   Please specify on which day you would like your medication(s) to arrive. Note: if you need your medication(s) within 3 days, please call the pharmacy to schedule your order at [phone]  06/04/2023   Has your insurance changed since your last refill? No   Would you like a pharmacist to call you to discuss your medication(s)? No   Do you require a signature for your package? (Note: if we are billing Medicare Part B or your order contains a controlled substance, we will require a signature) No   I have been provided my out of pocket cost for my medication and approve the pharmacy to charge the amount to my credit card on file. Yes         Completed refill call assessment today to schedule patient's medication shipment from the [facility] [phone]).  All relevant notes have been reviewed.       Confirmed patient received a Conservation officer, historic buildings and a Surveyor, mining with first shipment. The patient will receive a drug information handout for each medication shipped and additional FDA Medication Guides as required.         REFERRAL TO PHARMACIST     Referral to the pharmacist: Not needed      [facility]     Shipping address confirmed in Epic.     Delivery Scheduled: Yes, Expected medication delivery date: 06/07/23.  Rescheduled delivery with pt via phone call, confirmed prefilled syringe vs. Autoinjector.    Medication will be delivered via UPS to the prescription address in Epic WAM.    Tobi Bastos, PharmD   [facility] Specialty Pharmacist

## 2023-06-06 MED FILL — BIMZELX 320 MG/2 ML SUBCUTANEOUS SYRINGE: SUBCUTANEOUS | 28 days supply | Qty: 2 | Fill #1

## 2023-06-10 ENCOUNTER — Encounter: Admit: 2023-06-10 | Discharge: 2023-06-11 | Payer: BLUE CROSS/BLUE SHIELD

## 2023-06-10 DIAGNOSIS — L732 Hidradenitis suppurativa: Secondary | ICD-10-CM | POA: Diagnosis not present

## 2023-06-10 LAB — CBC W/ AUTO DIFF
BASOPHILS ABSOLUTE COUNT: 0.1 10*9/L (ref 0.0–0.1)
BASOPHILS RELATIVE PERCENT: 1.4 %
EOSINOPHILS ABSOLUTE COUNT: 0.3 10*9/L (ref 0.0–0.5)
EOSINOPHILS RELATIVE PERCENT: 3 %
HEMATOCRIT: 38.1 % (ref 34.0–44.0)
HEMOGLOBIN: 12.6 g/dL (ref 11.3–14.9)
LYMPHOCYTES ABSOLUTE COUNT: 3.6 10*9/L (ref 1.1–3.6)
LYMPHOCYTES RELATIVE PERCENT: 36.6 %
MEAN CORPUSCULAR HEMOGLOBIN CONC: 33.1 g/dL (ref 32.0–36.0)
MEAN CORPUSCULAR HEMOGLOBIN: 29.7 pg (ref 25.9–32.4)
MEAN CORPUSCULAR VOLUME: 89.6 fL (ref 77.6–95.7)
MEAN PLATELET VOLUME: 8 fL (ref 6.8–10.7)
MONOCYTES ABSOLUTE COUNT: 0.9 10*9/L — ABNORMAL HIGH (ref 0.3–0.8)
MONOCYTES RELATIVE PERCENT: 8.7 %
NEUTROPHILS ABSOLUTE COUNT: 5 10*9/L (ref 1.8–7.8)
NEUTROPHILS RELATIVE PERCENT: 50.3 %
PLATELET COUNT: 304 10*9/L (ref 150–450)
RED BLOOD CELL COUNT: 4.26 10*12/L (ref 3.95–5.13)
RED CELL DISTRIBUTION WIDTH: 15 % (ref 12.2–15.2)
WBC ADJUSTED: 9.9 10*9/L (ref 3.6–11.2)

## 2023-06-10 LAB — SEDIMENTATION RATE: ERYTHROCYTE SEDIMENTATION RATE: 76 mm/h — ABNORMAL HIGH (ref 0–20)

## 2023-06-10 LAB — CREATININE
CREATININE: 0.6 mg/dL (ref 0.55–1.02)
EGFR CKD-EPI (2021) FEMALE: >90 mL/min/{1.73_m2} (ref >=60)

## 2023-06-10 LAB — ALT: ALT (SGPT): 11 U/L (ref 10–49)

## 2023-06-10 LAB — BUN: BLOOD UREA NITROGEN: 15 mg/dL (ref 9–23)

## 2023-06-10 LAB — C-REACTIVE PROTEIN: C-REACTIVE PROTEIN: 5.7 mg/L (ref <=10.0)

## 2023-06-10 LAB — AST: AST (SGOT): 14 U/L (ref <=34)

## 2023-06-10 MED ADMIN — cetirizine (ZYRTEC) tablet 10 mg: 10 mg | ORAL | @ 15:00:00 | Stop: 2023-06-10

## 2023-06-10 MED ADMIN — diphenhydrAMINE (BENADRYL) capsule/tablet 25 mg: 25 mg | ORAL | @ 15:00:00 | Stop: 2023-06-10

## 2023-06-10 MED ADMIN — acetaminophen (TYLENOL) tablet 650 mg: 650 mg | ORAL | @ 15:00:00 | Stop: 2023-06-10

## 2023-06-10 MED ADMIN — inFLIXimab-axxq (AVSOLA) 10 mg/kg = 800 mg in sodium chloride (NS) 250 mL IVPB: 10 mg/kg | INTRAVENOUS | @ 15:00:00 | Stop: 2023-06-10

## 2023-06-10 MED ADMIN — ondansetron (ZOFRAN) injection 8 mg: 8 mg | INTRAVENOUS | @ 15:00:00 | Stop: 2023-06-10

## 2023-06-10 NOTE — Unmapped (Signed)
 Patient presents for standard Avsola (infliximab-axxq) infusion.  In no acute distress. Vitals stable.  Reports no new medical issues or S/S of infection.  PIV placed.  See MAR for premeds.    1104 Avsola (infliximab-axxq) 800 mg infusing as follows:     10ml/hr x 15 min  72ml/hr x 15 min  64ml/hr x 15 min  71ml/hr x 15 min  178ml/hr x 30 min  220ml/hr for the remainder of the infusion.    1312 Avsola (infliximab-axxq) infusion complete.  PIV flushed with NS.  Vitals stable.  Patient without any s/s of adverse reaction.    IV d/c'd.  Patient discharged from Infusion Center.

## 2023-06-19 ENCOUNTER — Ambulatory Visit: Admit: 2023-06-19 | Discharge: 2023-06-20 | Payer: BLUE CROSS/BLUE SHIELD

## 2023-06-19 DIAGNOSIS — F1190 Opioid use, unspecified, uncomplicated: Principal | ICD-10-CM

## 2023-06-19 DIAGNOSIS — L732 Hidradenitis suppurativa: Principal | ICD-10-CM

## 2023-06-19 DIAGNOSIS — F901 Attention-deficit hyperactivity disorder, predominantly hyperactive type: Principal | ICD-10-CM

## 2023-06-19 DIAGNOSIS — F3181 Bipolar II disorder: Principal | ICD-10-CM

## 2023-06-19 DIAGNOSIS — B999 Unspecified infectious disease: Principal | ICD-10-CM

## 2023-06-19 DIAGNOSIS — Z23 Encounter for immunization: Secondary | ICD-10-CM | POA: Diagnosis not present

## 2023-06-19 MED ORDER — MONTELUKAST 10 MG TABLET
ORAL_TABLET | Freq: Every evening | ORAL | 3 refills | 90.00 days | Status: CP
Start: 2023-06-19 — End: ?

## 2023-06-19 MED ORDER — CIPROFLOXACIN 0.3 %-DEXAMETHASONE 0.1 % EAR DROPS,SUSPENSION
Freq: Two times a day (BID) | OTIC | 0 refills | 19.00 days | Status: CP
Start: 2023-06-19 — End: ?

## 2023-06-19 NOTE — Unmapped (Signed)
 [facility] Internal Medicine at [facility]     Reason for visit: Med Refill    Questions / Concerns that need to be addressed: no    Screening BP- 108/70 92      HCDM reviewed and updated in Epic:    We are working to make sure all of our patients??? wishes are updated in Epic and part of that is documenting a Environmental health practitioner for each patient  A Health Care Decision Maker is someone you choose who can make health care decisions for you if you are not able - who would you most want to do this for you????  is already up to date.    HCDM (patient stated preference): CHASTA, DESHPANDE - Mother - [phone]    HCDM, back-up (If primary HCDM is unavailable): Kachmar,Marty - Father - [phone]    BPAs completed:  PHQ2, PHQ9, GAD7, Pneumococcal vaccine, and covid vaccine    Annual Screenings:   Domestic Abuse  __________________________________________________________________________________________    SCREENINGS COMPLETED IN FLOWSHEETS      AUDIT       PHQ2  PHQ-2 Total Score : 0    PHQ9  Thoughts that you would be better off dead, or of hurting yourself in some way: Not at all  PHQ-9 Total Score: 0    GAD7    Over the last 2 weeks, how often have you been bothered by the following problems?  Feeling nervous, anxious or on edge: Several days  Not being able to stop or control worrying: Several days  Worrying too much about different things: Several days  Trouble relaxing: Not at all  Being so restless that it is hard to sit still: Several days  Becoming easily annoyed or irritable: Not at all  Feeling afraid as if something awful might happen: Not at all  GAD-7 Total Score: 4    COPD Assessment       Falls Risk

## 2023-06-19 NOTE — Unmapped (Signed)
 Melissa Pearson, DMSc, PA-C    Assessment/Plan:       1.  Otitis externa-she follows with offsite ENT for history of chronic recurring ear infections, she is starting to have symptoms of pain in her left ear, the EAC is erythematous, we will start Cipro  plus dexamethasone  drops as she has used this in the past with benefit.  She will reach out to ENT for further follow-up.  2.  ADHD-maintained on methylphenidate  20 mg daily, reassess 3 months.  3.  Vitamin D deficiency-currently on vitamin D supplement, vitamin D was low at 21.65 in 10/24, plan to repeat in the near future.  4.  History of substance misuse-remains on Suboxone , followed through [facility] MOD clinic, appreciate assistance.  5.  History of nephrolithiasis-currently asymptomatic.  6.  Hidradenitis suppurativa-followed by [facility] dermatology, appreciate assistance.  7.  Bipolar-remains on venlafaxine, not mentioned this visit.  8.  HM-COVID and pneumonia shots today.    F/u: 3 months    Subject/Objective:     Chief Complaint   Patient presents with    Follow-up     Medication refill  L ear infection     S: 33 y.o. year old female with PMHx significant for   Past Medical History:   Diagnosis Date    Acne     Acute sepsis     with C DIFF AND PULM EMBOLISM       APRIL 2023    ADHD     Allergic     Anxiety disorder 06/28/2021    Last Assessment & Plan:    Formatting of this note might be different from the original.   Currently followed by psychiatry does have hydroxyzine  and BuSpar.  Continue following with psychiatry as recommended and take medications as prescribed    Attention deficit hyperactivity disorder (ADHD), predominantly hyperactive type 02/11/2012    Last Assessment & Plan:    Formatting of this note might be different from the original.   Patient currently maintained on methylphenidate  20 mg daily.  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    Bipolar 1 disorder Bipolar 2 disorder, major depressive episode 08/04/2019    Last Assessment & Plan:    Formatting of this note might be different from the original.   Patient is followed by Dr. Joselyn Nicely currently maintained on Lamictal and Abilify    Depression     Diabetes mellitus 2022    Pre diabetic    Difficulty sleeping     Disorder of skin or subcutaneous tissue 2018    HS    Flu     had tamiflu    Headache, tension-type     Hidradenitis suppurativa 12/18/2017    History of opioid abuse     Hydradenitis     Keloid     Right ear    Kidney stones     Polysubstance abuse     Pulmonary embolism 07/05/2021    Last Assessment & Plan:    Formatting of this note might be different from the original.   Patient has been evaluated by oncology and has completed Eliquis 5 mg twice daily for 6 months.  07/2021 [facility] hospital   .     Patient Active Problem List   Diagnosis    Hidradenitis suppurativa    Attention deficit hyperactivity disorder (ADHD), predominantly hyperactive type    Bipolar 2 disorder, major  depressive episode    Anxiety disorder    Anemia    Acute metabolic encephalopathy    Prediabetes    Obesity (BMI 35.0-39.9 without comorbidity)    Insomnia    Pulmonary embolism    Vitamin D deficiency    Opioid use disorder    Stimulant use disorder    Other chronic pain       Presents for routine follow-up       Your Medication List            Accurate as of June 19, 2023  1:23 PM. If you have any questions, ask your nurse or doctor.                START taking these medications      ciprofloxacin-dexAMETHasone 0.3-0.1 % otic suspension  Commonly known as: CIPRODEX  Administer 4 drops into the left ear two (2) times a day.  Started by: Stanton Earthly, PA            CONTINUE taking these medications      albuterol 90 mcg/actuation inhaler  Commonly known as: PROVENTIL HFA;VENTOLIN HFA  ProAir HFA 90 mcg/actuation aerosol inhaler     ALIGN ORAL  Take by mouth.     azelastine 137 mcg (0.1 %) nasal spray  Commonly known as: ASTELIN  PLACE 1 SPRAY INTO BOTH NOSTRILS 2 (TWO) TIMES DAILY. USE IN EACH NOSTRIL AS DIRECTED     BIMZELX AUTOINJECTOR 160 mg/mL Atin  Generic drug: bimekizumab-bkzx  Inject the contents of 2 syringes (320 mg total)  every 8 weeks.     BIMZELX AUTOINJECTOR 160 mg/mL Atin  Generic drug: bimekizumab-bkzx  Inject the contents of 2 syringes (320 mg total)  under the skin every twenty-eight (28) days.     BIMZELX 320 mg/2 mL subcutaneous injection  Generic drug: bimekizumab-bkzx  Inject the contents of 1 syringe (320 mg) under the skin every twenty-eight (28) days.     buprenorphine-naloxone 8-2 mg sublingual tablet  Commonly known as: SUBOXONE  Place 1 tablet (8 mg of buprenorphine total) under the tongue two (2) times a day.     diphenhydrAMINE 25 mg capsule/tablet  Commonly known as: BENADRYL  Take 1 each (25 mg total) by mouth every six (6) hours as needed for itching.     doxycycline 100 MG tablet  Commonly known as: VIBRA-TABS  TAKE 1 TABLET BY MOUTH 2 TIMES DAILY FOR 7-10 DAYS AS NEEDED FOR FLARES     empty container Misc  Use as directed to dispose of Cosentyx pens.     EPINEPHrine 0.3 mg/0.3 mL injection  Commonly known as: EPIPEN  INJECTAS DIRECTED AS NEEDED FOR SYSTEMIC REACTIONS     ergocalciferol-1,250 mcg (50,000 unit) 1,250 mcg (50,000 unit) capsule  Commonly known as: DRISDOL  Take 1 capsule (1,250 mcg total) by mouth once a week.     erythromycin 5 mg/gram (0.5 %) ophthalmic ointment  Commonly known as: ROMYCIN     fluticasone propion-salmeterol 115-21 mcg/actuation inhaler  Commonly known as: ADVAIR HFA  Inhale 2 puffs two (2) times a day.     hydrOXYzine 25 MG tablet  Commonly known as: ATARAX  Take 1 tablet (25 mg total) by mouth every eight (8) hours as needed for itching, allergies or anxiety.     iron,carbonyl-vitamin C 100-250 mg Tab  Take 1 tablet by mouth daily.     methylphenidate HCl 20 MG tablet  Commonly known as: RITALIN  Take 1 tablet (20 mg  total) by mouth daily.     mometasone 0.1 % solution  Commonly known as: ELOCON  Apply topically daily.     montelukast  10 mg tablet  Commonly known as: SINGULAIR   Take 1 tablet (10 mg total) by mouth nightly.     naloxone  4 mg/actuation nasal spray  Commonly known as: NARCAN   One spray in either nostril once for known/suspected opioid overdose. May repeat every 2-3 minutes in alternating nostril til EMS arrives     norethindrone 0.35 mg tablet  Commonly known as: MICRONOR  Take 1 tablet by mouth daily.     nystatin  100,000 unit/gram powder  Commonly known as: MYCOSTATIN   Apply topically.     nystatin  100,000 unit/gram ointment  Commonly known as: MYCOSTATIN   Apply topically two (2) times a day. To yeast rash     ondansetron  4 MG tablet  Commonly known as: ZOFRAN   Take 1 tablet (4 mg total) by mouth.     beclomethasone dipropionate  40 mcg/actuation inhaler  Commonly known as: QVAR  REDIHALER  Inhale 1 puff two (2) times a day.     QVAR  REDIHALER 40 mcg/actuation inhaler  Generic drug: beclomethasone dipropionate   Inhale 1 puff  in the morning and 1 puff in the evening.     REMICADE  IV  Remicade      sumatriptan 100 MG tablet  Commonly known as: IMITREX  Take 1 tablet (100 mg total) by mouth.     topiramate  25 MG tablet  Commonly known as: Topamax   25 MG NIGHTLY X 7 DAYS THEN 25 MG TWICE DAILY     venlafaxine 37.5 MG tablet  Commonly known as: EFFEXOR  Take 1 tablet (37.5 mg total) by mouth two (2) times a day.              Allergies   Allergen Reactions    Adhesive Tape-Silicones Hives     Pt can only tolerate cloth tape    Ginger Anaphylaxis    Other Hives     ABD pad causes rash  Pt can only tolerate cloth tape  Pt can only tolerate cloth tape    Shellfish Derived Anaphylaxis, Hives and Shortness Of Breath    Amoxicillin -Pot Clavulanate Headache    Opioids - Morphine Analogues       I become very angry    Sulfamethoxazole -Trimethoprim  Other (See Comments)     Causes Oral Thrush    Iodinated Contrast Media Hives    Vancomycin Other (See Comments)     Burns her vein really bad and always causes them to blow.        Medication adherence and barriers to the treatment plan have been addressed. Opportunities to optimize healthy behaviors have been discussed. Patient / caregiver voiced understanding.      ROS: negative for vision changes, CP, abdominal pain, bowel/bladder changes, bleeding, lower extremity edema    O:  Vital Signs:    Vitals:    06/19/23 1254   BP: 108/70   BP Site: L Arm   BP Position: Sitting   BP Cuff Size: Medium   Pulse: 92   Temp: 36.4 ??C (97.5 ??F)   TempSrc: Temporal   SpO2: 98%   Weight: 80.3 kg (177 lb)   Height: 154.9 cm (5' 1)       Physical Exam  General Appearance:    Alert, cooperative, no distress, appears stated age   Head:    Normocephalic, without obvious abnormality, atraumatic   Eyes:     Ears:  Throat:   Lips, mucosa, and tongue normal   Neck:   Supple, symmetrical, trachea midline,     No JVD bilaterally.   Lungs:     Clear to auscultation bilaterally, respirations unlabored   Chest Wall:    No tenderness or deformity    Heart:    Regular rate and rhythm, S1 and S2 normal, no murmur, rub    or gallop   Abdomen:     Soft, non-tender       Extremities:   Extremities normal, atraumatic, no cyanosis or edema   Psych :  No agitation, anxiety, or depressed mood.      Note - This record has been created using [employer]. Chart creation errors have been sought, but may not always have been located. Such creation errors do not reflect on the standard of medical care.

## 2023-06-23 DIAGNOSIS — T148XXA Other injury of unspecified body region, initial encounter: Principal | ICD-10-CM

## 2023-06-23 DIAGNOSIS — R52 Pain, unspecified: Principal | ICD-10-CM

## 2023-06-23 DIAGNOSIS — L732 Hidradenitis suppurativa: Principal | ICD-10-CM

## 2023-06-23 MED ORDER — MELOXICAM 7.5 MG TABLET
ORAL_TABLET | Freq: Two times a day (BID) | ORAL | 5 refills | 0.00 days | PRN
Start: 2023-06-23 — End: ?

## 2023-06-24 MED ORDER — MELOXICAM 7.5 MG TABLET
ORAL_TABLET | Freq: Two times a day (BID) | ORAL | 5 refills | 30.00 days | PRN
Start: 2023-06-24 — End: 2023-12-21

## 2023-06-24 NOTE — Unmapped (Signed)
Pt has not been seen since October

## 2023-07-01 DIAGNOSIS — Z113 Encounter for screening for infections with a predominantly sexual mode of transmission: Secondary | ICD-10-CM | POA: Diagnosis not present

## 2023-07-01 DIAGNOSIS — Z01419 Encounter for gynecological examination (general) (routine) without abnormal findings: Secondary | ICD-10-CM | POA: Diagnosis not present

## 2023-07-02 DIAGNOSIS — G43109 Migraine with aura, not intractable, without status migrainosus: Secondary | ICD-10-CM | POA: Diagnosis not present

## 2023-07-02 DIAGNOSIS — R200 Anesthesia of skin: Secondary | ICD-10-CM | POA: Diagnosis not present

## 2023-07-02 DIAGNOSIS — Z86718 Personal history of other venous thrombosis and embolism: Secondary | ICD-10-CM | POA: Diagnosis not present

## 2023-07-02 DIAGNOSIS — M542 Cervicalgia: Secondary | ICD-10-CM | POA: Diagnosis not present

## 2023-07-02 NOTE — Unmapped (Signed)
 [facility] Specialty and Home Delivery Pharmacy Refill Coordination Note    Melissa Pearson, [name]: [birth year]  Phone: [phone] (home) [phone] (work)      All above HIPAA information was verified with patient.         07/01/2023     6:36 PM   Specialty Rx Medication Refill Questionnaire   Which Medications would you like refilled and shipped? Bimzelx   Please list all current allergies: Morpine iodine   Have you missed any doses in the last 30 days? No   Have you had any changes to your medication(s) since your last refill? No   How many days remaining of each medication do you have at home? 0   If receiving an injectable medication, next injection date is 06/07/2023   Have you experienced any side effects in the last 30 days? No   Please enter the full address (street address, city, state, zip code) where you would like your medication(s) to be delivered to. [address], [place] [address]   Please specify on which day you would like your medication(s) to arrive. Note: if you need your medication(s) within 3 days, please call the pharmacy to schedule your order at [phone]  06/06/2023   Has your insurance changed since your last refill? No   Would you like a pharmacist to call you to discuss your medication(s)? No   Do you require a signature for your package? (Note: if we are billing Medicare Part B or your order contains a controlled substance, we will require a signature) No   I have been provided my out of pocket cost for my medication and approve the pharmacy to charge the amount to my credit card on file. Yes         Completed refill call assessment today to schedule patient's medication shipment from the [facility] [phone]).  All relevant notes have been reviewed.       Confirmed patient received a Conservation officer, historic buildings and a Surveyor, mining with first shipment. The patient will receive a drug information handout for each medication shipped and additional FDA Medication Guides as required.         REFERRAL TO PHARMACIST     Referral to the pharmacist: Not needed      [facility]     Shipping address confirmed in Epic.     Delivery Scheduled: Yes, Expected medication delivery date: 07/05/23 confirmed via phone.     Medication will be delivered via UPS to the prescription address in Epic WAM.    Melissa Pearson   [facility] Special[facility] Delivery Pharm[facility] Specialty Technician

## 2023-07-04 MED FILL — BIMZELX 320 MG/2 ML SUBCUTANEOUS SYRINGE: SUBCUTANEOUS | 28 days supply | Qty: 2 | Fill #2

## 2023-07-08 ENCOUNTER — Encounter: Admit: 2023-07-08 | Discharge: 2023-07-09 | Payer: BLUE CROSS/BLUE SHIELD

## 2023-07-08 DIAGNOSIS — L732 Hidradenitis suppurativa: Secondary | ICD-10-CM | POA: Diagnosis not present

## 2023-07-08 MED ADMIN — cetirizine (ZYRTEC) tablet 10 mg: 10 mg | ORAL | @ 17:00:00 | Stop: 2023-07-08

## 2023-07-08 MED ADMIN — ondansetron (ZOFRAN) injection 8 mg: 8 mg | INTRAVENOUS | @ 17:00:00 | Stop: 2023-07-08

## 2023-07-08 MED ADMIN — acetaminophen (TYLENOL) tablet 650 mg: 650 mg | ORAL | @ 17:00:00 | Stop: 2023-07-08

## 2023-07-08 MED ADMIN — diphenhydrAMINE (BENADRYL) capsule/tablet 25 mg: 25 mg | ORAL | @ 17:00:00 | Stop: 2023-07-08

## 2023-07-08 MED ADMIN — inFLIXimab-axxq (AVSOLA) 10 mg/kg = 800 mg in sodium chloride (NS) 250 mL IVPB: 10 mg/kg | INTRAVENOUS | @ 18:00:00 | Stop: 2023-07-08

## 2023-07-08 NOTE — Unmapped (Signed)
 Patient presents for standard Avsola (infliximab-axxq) infusion.  In no acute distress. Vitals stable.  Reports no new medical issues or S/S of infection.  PIV placed.  See MAR for premeds.    1340  Avsola (infliximab-axxq) 800 mg infusing as follows:     10ml/hr x 15 min  50ml/hr x 15 min  13ml/hr x 15 min  66ml/hr x 15 min  113ml/hr x 30 min  228ml/hr for the remainder of the infusion.    1540 Avsola (infliximab-axxq) infusion complete.  PIV flushed with NS.  Vitals stable.  Patient without any s/s of adverse reaction. IV d/c'd.  Patient discharged from Infusion Center.

## 2023-07-08 NOTE — Unmapped (Signed)
 Dermatology Note     Assessment and Plan:      Hidradenitis Suppurativa [place] Stage III, chronic, stable -  on infliximab and bimekizumab  - Previously failed: Humira, Ertapenem X 12 week course completed 06/2021 (complication of PE requiring hospitalization - avoiding PICC lines in future due to this), Spironolactone (non-Derm provider discontinued due to risk of low blood pressure with other medication), augmentin (chest pain), clindamycin (C-diff), cefdinir  - Previous surgeries: unroofing left axilla 06/2021, L & R inguinal crease with Dr. Daina Drum 2024, R axilla and chest wall with Dr. Daina Drum 04/2023  - Locations: groin, inframammary; axillae & chest s/p excisions  - Continue bimekizumab-bkzx 320 mg/2ml every 4 weeks (HS dosing)  - Continue doxycycline 100 mg BID as needed for flares. Take with food and a full glass of water.  Patient reports yeast infections with doxycycline use. Oral fluconazole contraindicated with current meds. patient reports nystatin ointment and powder provides improvement..  - Continue infliximab 10 mg/kg infusions every 4 weeks.  - Continue nystatin 100,000 unit/gram ointment twice daily as needed  Refilled today.   - Continue following with  pain clinic for ongoing chronic pain management.  - Given symptoms, offer ILK today. Patient agrees. See procedure note below.     Intralesional Kenalog Procedure Note: After the patient was informed of risks (including atrophy and dyspigmentation), benefits and side effects of intralesional steroid injection, the patient elected to undergo injection and verbal consent was obtained. Skin was cleaned with alcohol and injected intralesionally into the sites (below). The patient tolerated the procedure well without complications and was instructed on post-procedure care.  Location(s): R inguinal fold   Number of sites treated: 1  Kenalog (triamcinolone) Concentration: 40 mg/ml   Volume: 0.4 ml total    Inverse psoriasis, groin  - Continue bimekizumab, infliximab as above  - Continue triamcinolone 0.1% ointment. Apply to inflamed areas of HS twice a day until improved, no longer than 2 weeks at a time. Then stop for two weeks. Restart as needed.     Cyst > keloid (2/2 to recent piercing):   - Diagnosis, treatment options, prognosis, risk/ benefit, and side effects of treatment were discussed with the patient.   - Start doxycycline 100 mg BID as needed for flares. Take with food and a full glass of water.    - Given symptoms, offered ILK. See procedure note below.     Intralesional Kenalog Procedure Note: After the patient was informed of risks (including atrophy and dyspigmentation), benefits and side effects of intralesional steroid injection, the patient elected to undergo injection and verbal consent was obtained. Skin was cleaned with alcohol and injected intralesionally into the sites (below). The patient tolerated the procedure well without complications and was instructed on post-procedure care.  Location(s): R upper eyelid   Number of sites treated: 1  Kenalog (triamcinolone) Concentration: 2.5 mg/ml   Volume: 0.2 ml total    High risk medication use (bimekizumab, infliximab)  - Last Quant Gold 11/2022 negative.   - Monitoring labs q8 weeks w/ infusions  - Reviewed AST, ALT, BUN, CBC w/ Diff, Creatinine from 06/2023 - acceptable to continue.     The patient was advised to call for an appointment should any new, changing, or symptomatic lesions develop.     RTC: Return in about 4 months (around 11/09/2023) for f/u HS. or sooner as needed   _________________________________________________________________      Chief Complaint     Chief Complaint   Patient presents with  HS     Spot above eye       HPI     Melissa Pearson is a 33 y.o. female who presents as a returning patient (last seen by Sherel Dikes, PA-C on 04/09/2023) to Dermatology for follow up of Hidradenitis suppurativa. At last visit, patient was to increase bimekizumab, continue doxycycline, continue infliximab, nystatin ointment, and received ILK injections for HS. Additionally, patient was to continue triamcinolone ointment for inverse psoriasis.     HS  - reports improvement and reduced flares with increased frequency of bimekizumab   - notes a current flare in the groin   - endorses pruritus   - endorses involvement of underarms, chest, and lower back, groin   - denies any activity of the buttocks     LOC  - reports a possible keloid below right eyebrow from piercing     Inverse psoriasis  - reports active area between the breasts   - currently applies triamcinolone ointment     The patient denies any other new or changing lesions or areas of concern.     Pertinent Past Medical History     No history of skin cancer    Family History:   Negative for melanoma    Past Medical History, Family History, Social History, Medication List, Allergies, and Problem List were reviewed in the rooming section of Epic.     ROS: Other than symptoms mentioned in the HPI, no fevers, chills, or other skin complaints    Physical Examination     GENERAL: Well-appearing female in no acute distress, resting comfortably.  NEURO: Alert and oriented, answers questions appropriately  PSYCH: Normal mood and affect  SKIN: Examination of the right eyelid, bilateral axillae, chest, and groin was performed  - erythematous subcutaneous nodule of the right upper cutaneous eyelid adjacent to right eyebrow   - appropriately healing excision sites of chest and right axilla   - multiple non-draining sinuses of the groin  - 1 inflamed nodule of right inguinal fold     All areas not commented on are within normal limits or unremarkable    Scribe's Attestation: Timoteo Force Debbi Failing) Rodney Clamp, PA-C obtained and performed the history, physical exam and medical decision making elements that were entered into the chart. Signed by Aaron Aas, Scribe, on Jul 09, 2023 at 9:36 AM.    ----------------------------------------------------------------------------------------------------------------------  Jul 09, 2023 10:15 AM. Documentation assistance provided by the Scribe. I was present during the time the encounter was recorded. The information recorded by the Scribe was done at my direction and has been reviewed and validated by me.  ----------------------------------------------------------------------------------------------------------------------      (Approved Template 11/16/2019)

## 2023-07-09 ENCOUNTER — Ambulatory Visit
Admit: 2023-07-09 | Discharge: 2023-07-10 | Payer: BLUE CROSS/BLUE SHIELD | Attending: Student in an Organized Health Care Education/Training Program | Primary: Student in an Organized Health Care Education/Training Program

## 2023-07-09 DIAGNOSIS — L720 Epidermal cyst: Principal | ICD-10-CM

## 2023-07-09 DIAGNOSIS — L304 Erythema intertrigo: Principal | ICD-10-CM

## 2023-07-09 DIAGNOSIS — L408 Other psoriasis: Principal | ICD-10-CM

## 2023-07-09 DIAGNOSIS — Z79899 Other long term (current) drug therapy: Principal | ICD-10-CM

## 2023-07-09 DIAGNOSIS — L732 Hidradenitis suppurativa: Principal | ICD-10-CM

## 2023-07-09 DIAGNOSIS — H02821 Cysts of right upper eyelid: Secondary | ICD-10-CM | POA: Diagnosis not present

## 2023-07-09 MED ORDER — BIMEKIZUMAB-BKZX 320 MG/2 ML SUBCUTANEOUS SYRINGE
SUBCUTANEOUS | 11 refills | 28.00000 days | Status: CN
Start: 2023-07-09 — End: ?

## 2023-07-09 MED ORDER — NYSTATIN 100,000 UNIT/GRAM TOPICAL POWDER
Freq: Two times a day (BID) | TOPICAL | 0 refills | 0.00000 days | Status: CN
Start: 2023-07-09 — End: ?

## 2023-07-09 MED ORDER — BIMEKIZUMAB-BKZX 160 MG/ML SUBCUTANEOUS AUTO-INJECTOR
SUBCUTANEOUS | 4 refills | 28.00000 days | Status: CN
Start: 2023-07-09 — End: ?

## 2023-07-09 MED ORDER — NYSTATIN 100,000 UNIT/GRAM TOPICAL OINTMENT
Freq: Two times a day (BID) | TOPICAL | 5 refills | 0.00000 days | Status: CP
Start: 2023-07-09 — End: ?

## 2023-07-09 NOTE — Unmapped (Addendum)
 PLAN:   - Continue infusions   - Continue bimzelx every 4 weeks   - Continue nystatin ointment for yeast rash   - Continue triamcinolone ointment to inflamed areas of HS twice a day until improved, no longer than 2 weeks at a time. Then, stop for 2 weeks.   - Start doxycycline for flares twice a day with food and a full glass of water. Stay upright for 30 minutes after taking. Sun protect while taking this medication.

## 2023-07-20 DIAGNOSIS — L732 Hidradenitis suppurativa: Principal | ICD-10-CM

## 2023-07-22 DIAGNOSIS — M654 Radial styloid tenosynovitis [de Quervain]: Secondary | ICD-10-CM | POA: Diagnosis not present

## 2023-07-22 DIAGNOSIS — M24444 Recurrent dislocation, right finger: Secondary | ICD-10-CM | POA: Diagnosis not present

## 2023-07-23 ENCOUNTER — Other Ambulatory Visit: Payer: Self-pay | Admitting: Orthopedic Surgery

## 2023-07-30 ENCOUNTER — Encounter: Payer: Self-pay | Admitting: Orthopedic Surgery

## 2023-07-30 NOTE — Discharge Instructions (Addendum)
 Keep arm elevated is much as possible over the next few days Keep splint clean and dry Work on finger motion except Pain medicine as directed Call office if you are having problems [phone]

## 2023-07-31 ENCOUNTER — Encounter: Payer: Self-pay | Admitting: Orthopedic Surgery

## 2023-07-31 NOTE — Anesthesia Preprocedure Evaluation (Addendum)
 Anesthesia Evaluation  Patient identified by MRN, date of birth, ID band Patient awake    Reviewed: Allergy & Precautions, H&P , NPO status , Patient's Chart, lab work & pertinent test results  Airway Mallampati: II  TM Distance: >3 FB Neck ROM: Full    Dental no notable dental hx.    Pulmonary pneumonia, former smoker   Pulmonary exam normal breath sounds clear to auscultation       Cardiovascular negative cardio ROS Normal cardiovascular exam+ Valvular Problems/Murmurs  Rhythm:Regular Rate:Normal     Neuro/Psych  Headaches PSYCHIATRIC DISORDERS Anxiety  Bipolar Disorder   negative neurological ROS  negative psych ROS   GI/Hepatic negative GI ROS, Neg liver ROS,,,  Endo/Other  negative endocrine ROS    Renal/GU negative Renal ROS  negative genitourinary   Musculoskeletal negative musculoskeletal ROS (+)    Abdominal   Peds negative pediatric ROS (+)  Hematology negative hematology ROS (+) Blood dyscrasia, anemia   Anesthesia Other Findings ADHD (attention deficit hyperactivity disorder)  Chronic tonsillitis Heart murmur  Migraine Hidradenitis  Hidradenitis Bipolar disorder   Septic shock  Pulmonary emboli   Hx of substance abuse  Pre-diabetes Body piercing--patient aware all facial piercings must be removed. Has piercing in lip, nose and frenulum and nipples, aware she can put spacers in nipple piercings, but not in facial piercings. Instructed by Alferd Angers, RN.   Patient states she can take oxycodone  and can take fentanyl   Reproductive/Obstetrics negative OB ROS                             Anesthesia Physical Anesthesia Plan  ASA: 3  Anesthesia Plan: General   Post-op Pain Management:    Induction: Intravenous  PONV Risk Score and Plan:   Airway Management Planned: LMA  Additional Equipment:   Intra-op Plan:   Post-operative Plan: Extubation in OR  Informed  Consent: I have reviewed the patients History and Physical, chart, labs and discussed the procedure including the risks, benefits and alternatives for the proposed anesthesia with the patient or authorized representative who has indicated his/her understanding and acceptance.     Dental Advisory Given  Plan Discussed with: Anesthesiologist, CRNA and Surgeon  Anesthesia Plan Comments: (Patient consented for risks of anesthesia including but not limited to:  - adverse reactions to medications - damage to eyes, teeth, lips or other oral mucosa - nerve damage due to positioning  - sore throat or hoarseness - Damage to heart, brain, nerves, lungs, other parts of body or loss of life  Patient voiced understanding and assent.)       Anesthesia Quick Evaluation

## 2023-07-31 NOTE — Unmapped (Signed)
 [facility] Specialty and Home Delivery Pharmacy Refill Coordination Note    Specialty Medication(s) to be Shipped:   Inflammatory Disorders: Bimzelx    Other medication(s) to be shipped: No additional medications requested for fill at this time     Cinda Hara, DOB: December 10, 1990  Phone: [phone] (home) [phone] (work)      All above HIPAA information was verified with patient.     Was a Nurse, learning disability used for this call? No    Completed refill call assessment today to schedule patient's medication shipment from the [facility]  [phone]).  All relevant notes have been reviewed.     Specialty medication(s) and dose(s) confirmed: Regimen is correct and unchanged.   Changes to medications: Mitsuye reports no changes at this time.  Changes to insurance: No  New side effects reported not previously addressed with a pharmacist or physician: None reported  Questions for the pharmacist: No    Confirmed patient received a Conservation officer, historic buildings and a Surveyor, mining with first shipment. The patient will receive a drug information handout for each medication shipped and additional FDA Medication Guides as required.       DISEASE/MEDICATION-SPECIFIC INFORMATION        For patients on injectable medications: Patient currently has 0 doses left.  Next injection is scheduled for 6/2 or 6/3.    SPECIALTY MEDICATION ADHERENCE     Medication Adherence    Patient reported X missed doses in the last month: 0  Specialty Medication: bimekizumab-bkzx (BIMZELX) 320 mg/2 mL subcutaneous injection  Patient is on additional specialty medications: No  Informant: mother              Were doses missed due to medication being on hold? No        REFERRAL TO PHARMACIST     Referral to the pharmacist: Not needed      [facility]     Shipping address confirmed in Epic.     Cost and Payment: Patient has a $0 copay, payment information is not required.    Delivery Scheduled: Yes, Expected medication delivery date: 6/3.     Medication will be delivered via UPS to the prescription address in Epic WAM.    Beverly Suriano S Lareen Mullings, PharmD   [facility] Delivery [facility]  Specialty Pharmacist

## 2023-07-31 NOTE — Unmapped (Signed)
 The [facility] has made a second and final attempt to reach this patient to refill the following medication:Bimzelx.      We have left voicemails on the following phone numbers: [phone]  [phone], have been unable to leave messages on the following phone numbers: [phone], have sent a text message to the following phone numbers: [phone]  [phone], and have sent a Mychart questionnaire..    Dates contacted: 07/24/2023   07/31/2023  Last scheduled delivery: 07/05/2023    The patient may be at risk of non-compliance with this medication. The patient should call the [facility] at [phone]  Option 4, then Option 2: Dermatology, Gastroenterology, Rheumatology to refill medication.    Melissa Pearson

## 2023-08-03 DIAGNOSIS — L732 Hidradenitis suppurativa: Principal | ICD-10-CM

## 2023-08-03 MED ORDER — DOXYCYCLINE HYCLATE 100 MG TABLET
ORAL_TABLET | 2 refills | 0.00000 days
Start: 2023-08-03 — End: ?

## 2023-08-05 ENCOUNTER — Encounter: Admit: 2023-08-05 | Discharge: 2023-08-06 | Payer: BLUE CROSS/BLUE SHIELD

## 2023-08-05 DIAGNOSIS — L732 Hidradenitis suppurativa: Secondary | ICD-10-CM | POA: Diagnosis not present

## 2023-08-05 LAB — SEDIMENTATION RATE: ERYTHROCYTE SEDIMENTATION RATE: 51 mm/h — ABNORMAL HIGH (ref 0–20)

## 2023-08-05 LAB — CBC W/ AUTO DIFF
BASOPHILS ABSOLUTE COUNT: 0.1 10*9/L (ref 0.0–0.1)
BASOPHILS RELATIVE PERCENT: 0.8 %
EOSINOPHILS ABSOLUTE COUNT: 0.2 10*9/L (ref 0.0–0.5)
EOSINOPHILS RELATIVE PERCENT: 1.9 %
HEMATOCRIT: 37 % (ref 34.0–44.0)
HEMOGLOBIN: 12.4 g/dL (ref 11.3–14.9)
LYMPHOCYTES ABSOLUTE COUNT: 4.1 10*9/L — ABNORMAL HIGH (ref 1.1–3.6)
LYMPHOCYTES RELATIVE PERCENT: 46.5 %
MEAN CORPUSCULAR HEMOGLOBIN CONC: 33.4 g/dL (ref 32.0–36.0)
MEAN CORPUSCULAR HEMOGLOBIN: 30.4 pg (ref 25.9–32.4)
MEAN CORPUSCULAR VOLUME: 90.9 fL (ref 77.6–95.7)
MEAN PLATELET VOLUME: 8.4 fL (ref 6.8–10.7)
MONOCYTES ABSOLUTE COUNT: 0.6 10*9/L (ref 0.3–0.8)
MONOCYTES RELATIVE PERCENT: 7.3 %
NEUTROPHILS ABSOLUTE COUNT: 3.8 10*9/L (ref 1.8–7.8)
NEUTROPHILS RELATIVE PERCENT: 43.5 %
PLATELET COUNT: 283 10*9/L (ref 150–450)
RED BLOOD CELL COUNT: 4.07 10*12/L (ref 3.95–5.13)
RED CELL DISTRIBUTION WIDTH: 13.8 % (ref 12.2–15.2)
WBC ADJUSTED: 8.7 10*9/L (ref 3.6–11.2)

## 2023-08-05 LAB — C-REACTIVE PROTEIN: C-REACTIVE PROTEIN: <5 mg/L (ref <=10.0)

## 2023-08-05 LAB — ALT: ALT (SGPT): 19 U/L (ref 10–49)

## 2023-08-05 LAB — CREATININE
CREATININE: 0.58 mg/dL (ref 0.55–1.02)
EGFR CKD-EPI (2021) FEMALE: >90 mL/min/{1.73_m2} (ref >=60)

## 2023-08-05 LAB — BUN: BLOOD UREA NITROGEN: 9 mg/dL (ref 9–23)

## 2023-08-05 LAB — AST: AST (SGOT): 18 U/L (ref <=34)

## 2023-08-05 MED ORDER — DOXYCYCLINE HYCLATE 100 MG TABLET
ORAL_TABLET | ORAL | 2 refills | 0.00000 days | Status: CP
Start: 2023-08-05 — End: ?

## 2023-08-05 MED ADMIN — diphenhydrAMINE (BENADRYL) capsule/tablet 25 mg: 25 mg | ORAL | @ 17:00:00 | Stop: 2023-08-05

## 2023-08-05 MED ADMIN — acetaminophen (TYLENOL) tablet 650 mg: 650 mg | ORAL | @ 17:00:00 | Stop: 2023-08-05

## 2023-08-05 MED ADMIN — inFLIXimab-axxq (AVSOLA) 10 mg/kg = 800 mg in sodium chloride (NS) 250 mL IVPB: 10 mg/kg | INTRAVENOUS | @ 18:00:00 | Stop: 2023-08-05

## 2023-08-05 MED ADMIN — ondansetron (ZOFRAN) injection 8 mg: 8 mg | INTRAVENOUS | @ 18:00:00 | Stop: 2023-08-05

## 2023-08-05 MED ADMIN — cetirizine (ZYRTEC) tablet 10 mg: 10 mg | ORAL | @ 17:00:00 | Stop: 2023-08-05

## 2023-08-05 MED FILL — BIMZELX 320 MG/2 ML SUBCUTANEOUS SYRINGE: SUBCUTANEOUS | 28 days supply | Qty: 2 | Fill #3

## 2023-08-05 NOTE — Unmapped (Signed)
 Patient presents for standard Avsola (infliximab-axxq) infusion.  In no acute distress. Vitals stable.  Reports no new medical issues or S/S of infection.  PIV placed.  See MAR for premeds.    1342  Avsola (infliximab-axxq) 800  mg infusing as follows:     10ml/hr x 15 min  69ml/hr x 15 min  49ml/hr x 15 min  58ml/hr x 15 min  155ml/hr x 30 min  219ml/hr for the remainder of the infusion.    1551 Avsola (infliximab-axxq) infusion complete.  PIV flushed with NS.  Vitals stable.  Patient without any s/s of adverse reaction. IV d/c'd.  Patient discharged from Infusion Center.

## 2023-08-06 ENCOUNTER — Ambulatory Visit: Payer: Self-pay

## 2023-08-06 ENCOUNTER — Ambulatory Visit
Admission: RE | Admit: 2023-08-06 | Discharge: 2023-08-06 | Disposition: A | Discharge status: Home / Self Care | Attending: Orthopedic Surgery | Admitting: Orthopedic Surgery

## 2023-08-06 ENCOUNTER — Other Ambulatory Visit: Payer: Self-pay

## 2023-08-06 ENCOUNTER — Ambulatory Visit: Payer: Self-pay | Admitting: Anesthesiology

## 2023-08-06 ENCOUNTER — Encounter: Payer: Self-pay | Admitting: Orthopedic Surgery

## 2023-08-06 ENCOUNTER — Encounter
Admission: RE | Disposition: A | Discharge status: Home / Self Care | Payer: Self-pay | Source: Home / Self Care | Attending: Orthopedic Surgery

## 2023-08-06 DIAGNOSIS — M24444 Recurrent dislocation, right finger: Secondary | ICD-10-CM | POA: Diagnosis not present

## 2023-08-06 DIAGNOSIS — F3181 Bipolar II disorder: Secondary | ICD-10-CM | POA: Insufficient documentation

## 2023-08-06 DIAGNOSIS — M654 Radial styloid tenosynovitis [de Quervain]: Secondary | ICD-10-CM | POA: Diagnosis not present

## 2023-08-06 DIAGNOSIS — Z87891 Personal history of nicotine dependence: Secondary | ICD-10-CM | POA: Diagnosis not present

## 2023-08-06 DIAGNOSIS — Z7901 Long term (current) use of anticoagulants: Secondary | ICD-10-CM | POA: Insufficient documentation

## 2023-08-06 DIAGNOSIS — F419 Anxiety disorder, unspecified: Secondary | ICD-10-CM | POA: Diagnosis not present

## 2023-08-06 DIAGNOSIS — Z86711 Personal history of pulmonary embolism: Secondary | ICD-10-CM | POA: Diagnosis not present

## 2023-08-06 HISTORY — DX: Conductive hearing loss, unilateral, left ear, with unrestricted hearing on the contralateral side: H90.12

## 2023-08-06 HISTORY — DX: Other specified health status: Z78.9

## 2023-08-06 HISTORY — DX: Prediabetes: R73.03

## 2023-08-06 HISTORY — DX: Other psychoactive substance abuse, in remission: F19.11

## 2023-08-06 LAB — POCT PREGNANCY, URINE: Preg Test, Ur: NEGATIVE

## 2023-08-06 SURGERY — CARPOMETACARPAL (CMC) FUSION OF THUMB
Anesthesia: General | Site: Thumb | Laterality: Right

## 2023-08-06 MED ORDER — OXYCODONE HCL 5 MG PO TABS
10.0000 mg | ORAL_TABLET | Freq: Once | ORAL | Status: AC
  Administered 2023-08-06: 10 mg via ORAL

## 2023-08-06 MED ORDER — HYDROCODONE-ACETAMINOPHEN 5-325 MG PO TABS: 1.0000 | ORAL_TABLET | ORAL | Status: DC | PRN

## 2023-08-06 MED ORDER — PROPOFOL 10 MG/ML IV BOLUS
INTRAVENOUS | Status: DC | PRN
  Administered 2023-08-06: 150 mg via INTRAVENOUS

## 2023-08-06 MED ORDER — DEXAMETHASONE SODIUM PHOSPHATE 4 MG/ML IJ SOLN
INTRAMUSCULAR | Status: AC
  Filled 2023-08-06: qty 1

## 2023-08-06 MED ORDER — CEFAZOLIN SODIUM-DEXTROSE 2-4 GM/100ML-% IV SOLN
2.0000 g | INTRAVENOUS | Status: AC
  Administered 2023-08-06: 2 g via INTRAVENOUS

## 2023-08-06 MED ORDER — OXYCODONE HCL 5 MG PO TABS
ORAL_TABLET | ORAL | Status: AC
Start: 2023-08-06 — End: ?
  Filled 2023-08-06: qty 2

## 2023-08-06 MED ORDER — CEFAZOLIN SODIUM-DEXTROSE 2-3 GM-%(50ML) IV SOLR
INTRAVENOUS | Status: AC
Start: 2023-08-06 — End: ?
  Filled 2023-08-06: qty 50

## 2023-08-06 MED ORDER — LACTATED RINGERS IV SOLN: INTRAVENOUS | Status: DC

## 2023-08-06 MED ORDER — FENTANYL CITRATE (PF) 100 MCG/2ML IJ SOLN
INTRAMUSCULAR | Status: AC
Start: 2023-08-06 — End: ?
  Filled 2023-08-06: qty 2

## 2023-08-06 MED ORDER — ETOMIDATE 2 MG/ML IV SOLN
INTRAVENOUS | Status: AC
  Filled 2023-08-06: qty 10

## 2023-08-06 MED ORDER — SODIUM CHLORIDE 0.9% FLUSH: 3.0000 mL | INTRAVENOUS | Status: DC | PRN

## 2023-08-06 MED ORDER — ONDANSETRON HCL 4 MG PO TABS
4.0000 mg | ORAL_TABLET | Freq: Four times a day (QID) | ORAL | Status: DC | PRN
Start: 2023-08-06 — End: 2023-08-06

## 2023-08-06 MED ORDER — ONDANSETRON HCL 4 MG/2ML IJ SOLN
INTRAMUSCULAR | Status: DC | PRN
Start: 2023-08-06 — End: 2023-08-06
  Administered 2023-08-06: 4 mg via INTRAVENOUS

## 2023-08-06 MED ORDER — BUPIVACAINE HCL (PF) 0.5 % IJ SOLN
INTRAMUSCULAR | Status: DC | PRN
  Administered 2023-08-06: 20 mL

## 2023-08-06 MED ORDER — FENTANYL CITRATE (PF) 100 MCG/2ML IJ SOLN
INTRAMUSCULAR | Status: DC | PRN
  Administered 2023-08-06 (×2): 50 ug via INTRAVENOUS

## 2023-08-06 MED ORDER — ROCURONIUM BROMIDE 10 MG/ML (PF) SYRINGE
PREFILLED_SYRINGE | INTRAVENOUS | Status: AC
  Filled 2023-08-06: qty 10

## 2023-08-06 MED ORDER — DEXAMETHASONE SODIUM PHOSPHATE 4 MG/ML IJ SOLN
INTRAMUSCULAR | Status: DC | PRN
  Administered 2023-08-06: 4 mg via INTRAVENOUS

## 2023-08-06 MED ORDER — MIDAZOLAM HCL 5 MG/5ML IJ SOLN
INTRAMUSCULAR | Status: DC | PRN
  Administered 2023-08-06: 2 mg via INTRAVENOUS

## 2023-08-06 MED ORDER — SUCCINYLCHOLINE CHLORIDE 200 MG/10ML IV SOSY
PREFILLED_SYRINGE | INTRAVENOUS | Status: AC
  Filled 2023-08-06: qty 10

## 2023-08-06 MED ORDER — SODIUM CHLORIDE 0.9 % IV SOLN: INTRAVENOUS | Status: DC

## 2023-08-06 MED ORDER — LIDOCAINE HCL (CARDIAC) PF 100 MG/5ML IV SOSY
PREFILLED_SYRINGE | INTRAVENOUS | Status: DC | PRN
  Administered 2023-08-06: 100 mg via INTRATRACHEAL

## 2023-08-06 MED ORDER — ACETAMINOPHEN 325 MG PO TABS: 325.0000 mg | ORAL_TABLET | Freq: Four times a day (QID) | ORAL | Status: DC | PRN

## 2023-08-06 MED ORDER — ONDANSETRON HCL 4 MG/2ML IJ SOLN: 4.0000 mg | Freq: Four times a day (QID) | INTRAMUSCULAR | Status: DC | PRN

## 2023-08-06 MED ORDER — MIDAZOLAM HCL 2 MG/2ML IJ SOLN
INTRAMUSCULAR | Status: AC
  Filled 2023-08-06: qty 2

## 2023-08-06 MED ORDER — SODIUM CHLORIDE 0.9% FLUSH: 3.0000 mL | Freq: Two times a day (BID) | INTRAVENOUS | Status: DC

## 2023-08-06 MED ORDER — LIDOCAINE HCL (PF) 2 % IJ SOLN
INTRAMUSCULAR | Status: AC
  Filled 2023-08-06: qty 5

## 2023-08-06 MED ORDER — HYDROCODONE-ACETAMINOPHEN 7.5-325 MG PO TABS: 1.0000 | ORAL_TABLET | ORAL | Status: DC | PRN

## 2023-08-06 MED ORDER — ONDANSETRON HCL 4 MG/2ML IJ SOLN
INTRAMUSCULAR | Status: AC
  Filled 2023-08-06: qty 2

## 2023-08-06 MED ORDER — HYDROCODONE-ACETAMINOPHEN 5-325 MG PO TABS: 1.0000 | ORAL_TABLET | ORAL | 0 refills | Status: DC | PRN

## 2023-08-06 SURGICAL SUPPLY — 17 items
BNDG ELASTIC 3X5.8 VLCR NS LF (GAUZE/BANDAGES/DRESSINGS) ×2 IMPLANT
CHLORAPREP W/TINT 26 (MISCELLANEOUS) ×2 IMPLANT
COVER LIGHT HANDLE 1/PK (MISCELLANEOUS) ×4 IMPLANT
DRAPE FLUOR MINI C-ARM 54X84 (DRAPES) ×2 IMPLANT
ELECTRODE REM PT RTRN 9FT ADLT (ELECTROSURGICAL) ×2 IMPLANT
GAUZE SPONGE 4X4 12PLY STRL (GAUZE/BANDAGES/DRESSINGS) ×2 IMPLANT
GAUZE XEROFORM 1X8 LF (GAUZE/BANDAGES/DRESSINGS) ×2 IMPLANT
GLOVE SURG SYN 9.0 PF PI (GLOVE) ×2 IMPLANT
GOWN STRL REIN 2XL XLG LVL4 (GOWN DISPOSABLE) IMPLANT
GOWN STRL REUS W/ TWL LRG LVL3 (GOWN DISPOSABLE) ×2 IMPLANT
KIT TURNOVER KIT A (KITS) ×2 IMPLANT
NS IRRIG 500ML POUR BTL (IV SOLUTION) ×2 IMPLANT
PACK EXTREMITY ARMC (MISCELLANEOUS) ×2 IMPLANT
SPLINT CAST 1 STEP 3X12 (MISCELLANEOUS) ×2 IMPLANT
SPONGE SURGIFOAM ABS GEL 12-7 (HEMOSTASIS) ×2 IMPLANT
SUTURE ETHLN 4-0 FS2 18XMF BLK (SUTURE) ×2 IMPLANT
SYSTEM IMPLANT TIGHTROPE MINI (Anchor) ×2 IMPLANT

## 2023-08-06 NOTE — Transfer of Care (Signed)
 Immediate Anesthesia Transfer of Care Note  Patient: Courtney [name]  Procedure(s) Performed: CARPOMETACARPAL (CMC) FUSION OF THUMB (Right: Thumb) RELEASE, FIRST DORSAL COMPARTMENT, HAND (Right: Hand)  Patient Location: PACU  Anesthesia Type: General  Level of Consciousness: awake, alert  and patient cooperative  Airway and Oxygen Therapy: Patient Spontanous Breathing and Patient connected to supplemental oxygen  Post-op Assessment: Post-op Vital signs reviewed, Patient's Cardiovascular Status Stable, Respiratory Function Stable, Patent Airway and No signs of Nausea or vomiting  Post-op Vital Signs: Reviewed and stable  Complications: No notable events documented.

## 2023-08-06 NOTE — H&P (Signed)
 Chief Complaint Patient presents with Right Wrist - Follow-up  Subjective  Nabeeha is a 33 y.o. female who presents for Follow-up of the Right Wrist HPI History of Present Illness Linnae Rasool is a 33 year old female who presents with right hand pain and thumb instability.  She experiences persistent pain in her right hand, particularly around the thumb area, with exacerbation during certain movements. She has a history of receiving an injection for [employer], which provided some relief, but she continues to experience instability in her thumb.  She has not had any recent imaging of her right hand or thumb at the current facility, but previous imaging was performed at [facility].  She works as an EMT and is concerned about the impact of her hand condition on her ability to work, noting that she may need to be out of work for approximately four weeks post-procedure.  She has a history of bilateral pulmonary embolism from 2023, for which she was on Eliquis , but she is no longer taking it. No current use of Eliquis .  Review of Systems  Patient Active Problem List Diagnosis Axillary hidradenitis suppurativa Bipolar disorder, in full remission, most recent episode depressed (CMS/HHS-HCC) Deviated nasal septum Allergy to adhesive tape Anxiety disorder Attention deficit hyperactivity disorder (ADHD), predominantly hyperactive type Bipolar 2 disorder, major depressive episode (CMS/HHS-HCC) Hypotension Other pulmonary embolism without acute cor pulmonale (CMS/HHS-HCC) Methamphetamine use (CMS/HHS-HCC) History of substance abuse (CMS/HHS-HCC) Opioid use disorder Other chronic pain Polysubstance abuse (CMS/HHS-HCC) Bipolar I disorder (CMS/HHS-HCC)  Outpatient Medications Prior to Visit Medication Sig Dispense Refill albuterol  90 mcg/actuation inhaler albuterol  sulfate HFA 90 mcg/actuation aerosol inhaler bimekizumab-bkzx (BIMZELX AUTOINJECTOR) 160 mg/mL AtIn  Inject 320 mg subcutaneously Taking 320 mg every 4 weeks for the next 16 weeks then will increase to 320 mg every 8 weeks. buprenorphine -naloxone (SUBOXONE) 8-2 mg SL film Place under the tongue once daily celecoxib  (CELEBREX ) 200 MG capsule Take 1 capsule by mouth 2 (two) times daily diphenhydrAMINE  (BENADRYL ) 25 mg capsule Take by mouth every 6 (six) hours as needed doxycycline  (VIBRA -TABS) 100 MG tablet Take 100 mg by mouth once daily EPINEPHrine  (EPIPEN ) 0.3 mg/0.3 mL auto-injector epinephrine  0.3 mg/0.3 mL injection, auto-injector ergocalciferol , vitamin D2, 1,250 mcg (50,000 unit) capsule Take 50,000 Units by mouth every 7 (seven) days infliximab  (REMICADE  IV) Remicade  methylphenidate  HCl (RITALIN ) 20 MG tablet Take 20 mg by mouth once daily montelukast  (SINGULAIR ) 10 mg tablet montelukast  10 mg tablet TAKE 1 TABLET BY MOUTH EVERYDAY AT BEDTIME norethindrone (MICRONOR) 0.35 mg tablet Take 0.35 mg by mouth once daily ondansetron  (ZOFRAN -ODT) 4 MG disintegrating tablet ondansetron  4 mg disintegrating tablet TAKE 1 TABLET BY MOUTH EVERY 8 HOURS AS NEEDED FOR NAUSEA AND VOMITING oxyCODONE  (ROXICODONE ) 5 MG immediate release tablet Take 5 mg by mouth every 6 (six) hours as needed SUMAtriptan (IMITREX) 100 MG tablet Take 1 tablet (100 mg total) by mouth as directed for Migraine MAY TAKE A SECOND DOSE AFTER 2 HOURS IF NEEDED 9 tablet 1 topiramate  (TOPAMAX ) 25 MG tablet Take 25 mg by mouth 2 (two) times daily venlafaxine (EFFEXOR) 75 MG tablet Take 75 mg twice daily for headaches 180 tablet 1 HYDROcodone -acetaminophen  (NORCO) 5-325 mg tablet (Patient not taking: Reported on 07/02/2023) lamoTRIgine  (LAMICTAL ) 100 MG tablet Take 50 mg by mouth every 12 (twelve) hours (Patient not taking: Reported on 07/02/2023) meloxicam (MOBIC) 7.5 MG tablet Take 1 tablet by mouth 2 (two) times daily (Patient not taking: Reported on 07/02/2023) methylphenidate  HCl (RITALIN ) 10 MG tablet TAKE 1  TABLET (10 MG TOTAL) BY  MOUTH DAILY AS NEEDED (AFTERNOON ADHD SYMPTOMS). (Patient not taking: Reported on 07/02/2023) traMADoL  (ULTRAM ) 50 mg tablet Take 50 mg by mouth every 6 (six) hours as needed (Patient not taking: Reported on 07/02/2023)  No facility-administered medications prior to visit.    Objective  Vitals: 07/22/23 1413 BP: 122/66 Weight: 85.4 kg (188 lb 3.2 oz) Height: 154.9 cm (5\' 1" ) PainSc: 9 PainLoc: Wrist  Body mass index is 35.56 kg/m.  Home Vitals:   Physical Exam Physical Exam HEENT: Normal oropharynx, moist mucous membranes, upper gum piercing present CHEST: Clear to auscultation bilaterally, no wheezes, rhonchi, or crackles CARDIOVASCULAR: Normal heart rate and rhythm, S1 and S2 normal without murmurs MUSCULOSKELETAL: One centimeter knot at the radial styloid of the right hand, CMC instability in the right hand  Constitutional: alert, in NAD, and communicates well  Results RADIOLOGY Right hand x-ray: Instability with significant subluxation of the thumb metacarpal (10/2022)    Assessment/Plan:  Assessment & Plan De Quervain's tenosynovitis She experiences persistent right hand pain with a 1 cm nodule at the radial styloid, consistent with De Quervain's tenosynovitis. Previous injection treatment was administered. Surgical intervention is planned to release the tendons, involving a small incision over the nodule and cutting through the band to free the tendons. Schedule surgical release under anesthesia. Postoperative bracing for one month with a thumb spica brace.  Chronic subluxation of right thumb CMC joint Chronic instability of the right thumb CMC joint with significant subluxation is observed on x-ray. Absence of significant arthritis makes her a candidate for [facility] stabilization using a mini tight rope technique, which involves placing a wire and anchors to stabilize the joint without bone removal, allowing for potential removal if unsatisfactory. Expected recovery time  is approximately four weeks, with minimal need for hand therapy. Schedule CMC stabilization procedure using mini tight rope technique under anesthesia. Postoperative bracing for one month with a thumb spica brace.  Bilateral pulmonary embolism She had bilateral pulmonary embolism in 2023. No current anticoagulation therapy is required. Anesthesia is expected to proceed without complications given the time elapsed since the embolism. Diagnoses and all orders for this visit:  De Quervain's tenosynovitis  Chronic or recurrent subluxation of carpometacarpal joint of right thumb  This visit was coded based on medical decision making (MDM).    Future Appointments This patient does not currently have any appointments scheduled.   There are no Patient Instructions on file for this visit.  An after visit summary was provided for the patient either in written format (printed) or through My [facility].  This note has been created using automated tools and reviewed for accuracy by [facility].  Electronically signed by Victory Gravel, MD at 07/22/2023 3:10 PM EDT   Reviewed  H+P. No changes noted.

## 2023-08-06 NOTE — Op Note (Signed)
 08/06/2023  12:48 PM  PATIENT:  Courtney [name]  33 y.o. female  PRE-OPERATIVE DIAGNOSIS:  De Quervain's tenosynovitis M65.4 Chronic or recurrent subluxation of carpometacarpal joint of right thumb M24.444  POST-OPERATIVE DIAGNOSIS:  De Quervain's tenosynovitis  Chronic or recurrent subluxation of carpometacarpal joint of right thumb  PROCEDURE:  Procedure(s) with comments: CARPOMETACARPAL (CMC) FUSION OF THUMB (Right) - Per Dr. Mozell Arias, a carpometacarpal arthroplasty, but no bone removed RELEASE, FIRST DORSAL COMPARTMENT, HAND (Right)  SURGEON: Terance Felt, MD  ASSISTANTS: None  ANESTHESIA:   general  EBL:  Total I/O In: 450 [I.V.:350; IV Piggyback:100] Out: 0   BLOOD ADMINISTERED:none  DRAINS: none   LOCAL MEDICATIONS USED:  BUPIVICAINE 20 cc 0.5% without epinephrine   SPECIMEN:  No Specimen  DISPOSITION OF SPECIMEN:  N/A  COUNTS:  YES  TOURNIQUET:   Total Tourniquet Time Documented: Upper Arm (Right) - 33 minutes Total: Upper Arm (Right) - 33 minutes   IMPLANTS: Mini tight rope x 1  DICTATION: .Dragon Dictation patient was brought to the operating room and after general anesthesia was obtained the right arm was prepped and draped in usual fashion with a tourniquet applied the upper arm.  After patient identification and timeout procedure completed tourniquet was raised to 250 mmHg.  Transverse incision made over the radial styloid with the subcutaneous tissue spread care being taken to preserve subcutaneous branches of the superficial radial nerve.  The area of the de Quervain's canal was identified and incised and scissors used to open this up proximally and distally under direct visualization.  On exam all the 2 tendons were identified and complete release was noted.  There was moderate tenosynovitis present.  The wound subsequently infiltrated with 10 cc of half percent Sensorcaine  and closed using simple erupted 4-0 nylon skin suture next with the mini C arm aiding in  positioning for the incision small incision was made at the base of the first metacarpal and subcutaneous tissue spread with a guidewire inserted from the base of the metacarpal into the second metacarpal in the midline.  The incision is then made between the 2nd and 3rd metacarpals and is guidewire pulled out of the wound with the loop being utilized on that wire to pass the mini tight rope.  The sutures were then passed through the other end of the tight rope anchor and it was held in a tightened position with excellent CMC's to stability under fluoroscopic exam.  The sutures were then tightened to hold this in position with mini C arm views showing good position of the implant.  Sutures were cut and the wound again infiltrated with another 10 cc of half percent Sensorcaine  in both incisions.  Both wounds were then closed with interrupted 4-0 nylon with sterile dressings of Xeroform 4 x 4 web roll and a thumb spica splint applied followed by an Ace wrap and tourniquet let down.  PLAN OF CARE: Discharge to home after PACU  PATIENT DISPOSITION:  PACU - hemodynamically stable.

## 2023-08-06 NOTE — Anesthesia Postprocedure Evaluation (Signed)
 Anesthesia Post Note  Patient: Janace Mckusick  Procedure(s) Performed: CARPOMETACARPAL (CMC) FUSION OF THUMB (Right: Thumb) RELEASE, FIRST DORSAL COMPARTMENT, HAND (Right: Hand)  Patient location during evaluation: PACU Anesthesia Type: General Level of consciousness: awake and alert Pain management: pain level controlled Vital Signs Assessment: post-procedure vital signs reviewed and stable Respiratory status: spontaneous breathing, nonlabored ventilation, respiratory function stable and patient connected to nasal cannula oxygen Cardiovascular status: blood pressure returned to baseline and stable Postop Assessment: no apparent nausea or vomiting Anesthetic complications: no   No notable events documented.   Last Vitals:  Vitals:   08/06/23 1310 08/06/23 1315  BP:  110/79  Pulse: 72 67  Resp: 14 16  Temp:  (!) 36.1 C  SpO2: 100% 98%    Last Pain:  Vitals:   08/06/23 1315  TempSrc:   PainSc: 8                  Terease Marcotte C Hollin Crewe

## 2023-08-08 DIAGNOSIS — M24444 Recurrent dislocation, right finger: Secondary | ICD-10-CM | POA: Diagnosis not present

## 2023-08-08 DIAGNOSIS — M654 Radial styloid tenosynovitis [de Quervain]: Secondary | ICD-10-CM | POA: Diagnosis not present

## 2023-08-20 DIAGNOSIS — H9212 Otorrhea, left ear: Secondary | ICD-10-CM | POA: Diagnosis not present

## 2023-08-27 NOTE — Unmapped (Signed)
 [facility] Specialty and Home Delivery Pharmacy Refill Coordination Note    Specialty Medication(s) to be Shipped:   Inflammatory Disorders: Bimzelx     Other medication(s) to be shipped: No additional medications requested for fill at this time     Melissa Pearson, DOB: 1990-06-24  Phone: [phone] (home) [phone] (work)      All above HIPAA information was verified with patient.     Was a Nurse, learning disability used for this call? No    Completed refill call assessment today to schedule patient's medication shipment from the [facility]  [phone]).  All relevant notes have been reviewed.     Specialty medication(s) and dose(s) confirmed: Regimen is correct and unchanged.   Changes to medications: [name] reports no changes at this time.  Changes to insurance: No  New side effects reported not previously addressed with a pharmacist or physician: None reported  Questions for the pharmacist: No    Confirmed patient received a Conservation officer, historic buildings and a Surveyor, mining with first shipment. The patient will receive a drug information handout for each medication shipped and additional FDA Medication Guides as required.       DISEASE/MEDICATION-SPECIFIC INFORMATION        For patients on injectable medications: Patient currently has 0 doses left.  Next injection is scheduled for 7/1.    SPECIALTY MEDICATION ADHERENCE     Medication Adherence    Patient reported X missed doses in the last month: 0  Specialty Medication: bimekizumab -bkzx (BIMZELX ) 320 mg/2 mL subcutaneous injection  Patient is on additional specialty medications: No  Informant: patient              Were doses missed due to medication being on hold? No        REFERRAL TO PHARMACIST     Referral to the pharmacist: Not needed      [facility]     Shipping address confirmed in Epic.     Cost and Payment: Patient has a $0 copay, payment information is not required.    Delivery Scheduled: Yes, Expected medication delivery date: 7/1. Medication will be delivered via UPS to the prescription address in Epic WAM.    Melissa Pearson Specialty and [name]

## 2023-09-02 ENCOUNTER — Encounter: Admit: 2023-09-02 | Discharge: 2023-09-03 | Payer: BLUE CROSS/BLUE SHIELD

## 2023-09-02 DIAGNOSIS — L732 Hidradenitis suppurativa: Secondary | ICD-10-CM | POA: Diagnosis not present

## 2023-09-02 MED ADMIN — cetirizine (ZYRTEC) tablet 10 mg: 10 mg | ORAL | @ 14:00:00 | Stop: 2023-09-02

## 2023-09-02 MED ADMIN — ondansetron (ZOFRAN) injection 8 mg: 8 mg | INTRAVENOUS | @ 14:00:00 | Stop: 2023-09-02

## 2023-09-02 MED ADMIN — inFLIXimab-axxq (AVSOLA) 10 mg/kg = 800 mg in sodium chloride (NS) 250 mL IVPB: 10 mg/kg | INTRAVENOUS | @ 15:00:00 | Stop: 2023-09-02

## 2023-09-02 MED ADMIN — acetaminophen (TYLENOL) tablet 650 mg: 650 mg | ORAL | @ 14:00:00 | Stop: 2023-09-02

## 2023-09-02 MED ADMIN — diphenhydrAMINE (BENADRYL) capsule/tablet 25 mg: 25 mg | ORAL | @ 14:00:00 | Stop: 2023-09-02

## 2023-09-02 MED FILL — BIMZELX 320 MG/2 ML SUBCUTANEOUS SYRINGE: SUBCUTANEOUS | 28 days supply | Qty: 2 | Fill #4

## 2023-09-02 NOTE — Unmapped (Signed)
 Patient arrives for scheduled Infliximab  infusion.  Reports feeling well.  VS stable.    PIV placed.  Premeds administered as ordered (patient requested Zofran  prior to infusion).  Callbell placed within reach.      1039 Infusion started  1251 Infusion completed    VS stable.  No complications noted  PIV removed    Patient discharged in stable condition

## 2023-09-04 NOTE — Therapy (Unsigned)
 OUTPATIENT OCCUPATIONAL THERAPY ORTHO EVALUATION  Patient Name: Courtney [name] MRN: [id number] DOB:[birth year], 33 y.o., female Today's Date: 09/05/2023  PCP: Glendia PA REFERRING PROVIDER: Dr Kathlynn  END OF SESSION:  OT End of Session - 09/05/23 0904     Visit Number 1    Number of Visits 12    Date for OT Re-Evaluation 10/31/23    OT Start Time 0904    Activity Tolerance Patient tolerated treatment well    Behavior During Therapy [facility] for tasks assessed/performed          Past Medical History:  Diagnosis Date   ADHD (attention deficit hyperactivity disorder)    Bipolar disorder (HCC)    Body piercing    Nose, frenulum, nipples   Chronic tonsillitis 08/2013   current strep, will finish antibiotic 09/04/2013; snores during sleep, mother denies apnea   Conductive hearing loss of left ear    Heart murmur    when I was born; outgrew it; I was a preemie   Hidradenitis    Hidradenitis 2017   Hx of substance abuse (HCC)    Migraine 12/19/2016   I've just had this one for 3 days (12/20/2016)   Pre-diabetes    Pulmonary emboli (HCC) 07/02/2021   BL   Septic shock (HCC) 07/02/2021   Past Surgical History:  Procedure Laterality Date   ADENOIDECTOMY N/A 10/15/2022   Procedure: ADENOIDECTOMY;  Surgeon: Jesus Oliphant, MD;  Location: [facility];  Service: ENT;  Laterality: N/A;   AXILLARY HIDRADENITIS EXCISION     AXILLARY LYMPH NODE BIOPSY Left 01/06/2019   CARPOMETACARPAL (CMC) FUSION OF THUMB Right 08/06/2023   Procedure: CARPOMETACARPAL [facility]) FUSION OF THUMB;  Surgeon: Kathlynn Sharper, MD;  Location: [facility];  Service: Orthopedics;  Laterality: Right;  Per Dr. Kathlynn, a carpometacarpal arthroplasty, but no bone removed   CYSTOSCOPY W/ URETERAL STENT PLACEMENT Right 05/28/2017   Procedure: CYSTOSCOPY WITH RETROGRADE PYELOGRAM/ RIGHT URETERAL STENT PLACEMENT;  Surgeon: Cam Morene ORN, MD;  Location: [facility];  Service: Urology;  Laterality: Right;   DORSAL  COMPARTMENT RELEASE Right 08/06/2023   Procedure: RELEASE, FIRST DORSAL COMPARTMENT, HAND;  Surgeon: Kathlynn Sharper, MD;  Location: [facility];  Service: Orthopedics;  Laterality: Right;   HYDRADENITIS EXCISION Bilateral 04/03/2017   Procedure: EXCISION AND DRAINAGE BILATERAL  HIDRADENITIS AXILLA;  Surgeon: Curvin Deward MOULD, MD;  Location: [facility] OR;  Service: General;  Laterality: Bilateral;   INCISION AND DRAINAGE ABSCESS Right 07/20/2016   Procedure: INCISION AND DRAINAGE ABSCESS right axilla;  Surgeon: Vernetta Berg, MD;  Location: [facility] OR;  Service: General;  Laterality: Right;   INTRAUTERINE DEVICE (IUD) INSERTION  11/2016   had the one in my left arm removed   IRRIGATION AND DEBRIDEMENT ABSCESS Right 12/21/2016   Procedure: IRRIGATION AND DEBRIDEMENT RIGHT AXILLARY HIDRADENITIS;  Surgeon: Teresa Lonni HERO, MD;  Location: [facility] OR;  Service: General;  Laterality: Right;   IRRIGATION AND DEBRIDEMENT ABSCESS Left 01/08/2017   Procedure: IRRIGATION AND DEBRIDEMENT AXILLARY ABSCESS;  Surgeon: Curvin Deward MOULD, MD;  Location: [facility];  Service: General;  Laterality: Left;   NASAL SEPTOPLASTY W/ TURBINOPLASTY Bilateral 02/25/2019   Procedure: NASAL SEPTOPLASTY WITH TURBINATE REDUCTION;  Surgeon: Jesus Oliphant, MD;  Location: [facility];  Service: ENT;  Laterality: Bilateral;   TONSILLECTOMY Bilateral 09/14/2013   Procedure: BILATERAL TONSILLECTOMY;  Surgeon: Oliphant Jesus, MD;  Location: [facility];  Service: ENT;  Laterality: Bilateral;   TYMPANOPLASTY Bilateral    rebuilt eardrums  WISDOM TOOTH EXTRACTION     Patient Active Problem List   Diagnosis Date Noted   Polypharmacy 01/17/2023   Abnormal urinalysis 01/17/2023   History of substance abuse (HCC) 01/17/2023   Hospital discharge follow-up 01/17/2023   Acute cystitis 01/02/2023   Left ureteral stone 01/02/2023   CAP (community acquired pneumonia) 01/02/2023   Polysubstance abuse (HCC) 01/02/2023    Methamphetamine use (HCC) 11/01/2022   Otitis externa of both ears 05/16/2022   Chronic rhinitis 02/15/2022   Shellfish allergy 02/15/2022   Pulmonary embolism (HCC) 07/19/2021   Hypotension 07/11/2021   Trichomonas vaginitis 07/05/2021   Acute metabolic encephalopathy 07/05/2021   Bilateral pulmonary embolism (HCC) 07/05/2021   Anxiety disorder 06/28/2021   Prediabetes 05/25/2021   Allergy to adhesive tape 05/25/2021   Hidradenitis suppurativa 05/23/2021   Insomnia 03/27/2021   Anemia 12/21/2020   Hypoglycemia 11/17/2020   Bipolar disorder, in full remission, most recent episode mixed (HCC) 10/07/2020   Bipolar disorder, in full remission, most recent episode depressed (HCC) 03/03/2020   Bipolar 2 disorder, major depressive episode (HCC) 08/04/2019   At risk for long QT syndrome 08/04/2019   Obesity (BMI 35.0-39.9 without comorbidity) 03/19/2019   Reactive airway disease 02/11/2019   Conductive hearing loss 03/13/2018   Axillary hidradenitis suppurativa 04/03/2017   Preventative health care 12/06/2016   Vitamin D  deficiency 10/18/2014   Lethargy 07/21/2014   Attention deficit hyperactivity disorder (ADHD), predominantly hyperactive type 02/11/2012    ONSET DATE: 08/06/23  REFERRING DIAG: R DeQuervain release and CMC arthroplasty  THERAPY DIAG:  No diagnosis found.  Rationale for Evaluation and Treatment: Rehabilitation  SUBJECTIVE:   SUBJECTIVE STATEMENT: My thumb had been bothering me for a while.  I had 2 shots in my thumb.  I am only wearing the hard brace at nighttime.  You will it hurts during the day.  I would say less than 25%.  My pain in my thumb is like a 4/10 Pt accompanied by: self  PERTINENT HISTORY: Dr Kathlynn 08/30/23 Assessment/Plan:   Assessment & Plan Status post [facility] stabilization surgery  Post-surgical follow-up after right CMC stabilization with mini tightrope and De Quervain's release (08/06/23) shows well-healing incisions and good thumb strength. No  infection or complications are present. Encourage scar massage to soften scar tissue. Plan for occupational therapy to improve motion and strength.  Postoperative numbness in hand  Persistent numbness on the ulnar side of the right thumb is noted post-surgery, which is unusual for the affected area. Possible causes include nerve irritation or compression unrelated to the surgical site. Provide carpal tunnel exercises for five minutes twice daily. Encourage scar massage with lotion four to five times daily over all three incisions. Advise against heavy lifting or yard work, but encourage normal activities like feeding herself.  De Quervain's tenosynovitis  Following De Quervain's release, decreased sensation on the ulnar side of the thumb is observed, which is atypical for this procedure. Continue scar massage to aid recovery. Provide education on the difference between [facility] and carpal tunnel syndrome. Diagnoses and all orders for this visit:  De Quervain's tenosynovitis  Chronic or recurrent subluxation of carpometacarpal joint of right thumb   PRECAUTIONS: Splint wearing     WEIGHT BEARING RESTRICTIONS: NWB thru hand  PAIN:  Are you having pain? 4/10 pain thumb and webspace  FALLS: Has patient fallen in last 6 months? 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  NEXT DR APPT:   OBJECTIVE:  Note: Objective measures were completed at Evaluation unless otherwise noted.  HAND DOMINANCE: Right  ADLs:   FUNCTIONAL OUTCOME MEASURES: Next session  UPPER EXTREMITY ROM:     Active ROM Right eval Left eval  Shoulder flexion    Shoulder abduction    Shoulder adduction    Shoulder extension    Shoulder internal rotation    Shoulder external rotation    Elbow flexion    Elbow extension    Wrist flexion 75 fist , 90 open  hand   Wrist extension 70   Wrist ulnar deviation 30   Wrist radial deviation 20   Wrist pronation    Wrist supination    (Blank rows = not tested)  Active ROM Right eval Left eval  Thumb MCP (0-60) 40 65  Thumb IP (0-80) 60 80  Thumb Radial abd/add (0-55) 42    Thumb Palmar abd/add (0-45) 50    Thumb Opposition to Small Finger Pain to 5th     Index MCP (0-90)     Index PIP (0-100)     Index DIP (0-70)      Long MCP (0-90)      Long PIP (0-100)      Long DIP (0-70)      Ring MCP (0-90)      Ring PIP (0-100)      Ring DIP (0-70)      Little MCP (0-90)      Little PIP (0-100)      Little DIP (0-70)      (Blank rows = not tested) Discomfort with composite flexion at the dorsal second metacarpal.  At incision. CMC flexion on the right 30 and left 50. Opposition patient should decrease flexion at the IP and the MCP. Adducting into the carpal tunnel      HAND FUNCTION: Grip strength: Right:   lbs; Left:   lbs, Lateral pinch: Right:   lbs, Left:   lbs, and 3 point pinch: Right:   lbs, Left:   lbs  COORDINATION: 4 weeks postop.  Was in a soft cast in our thumb spica increase stiffness decreased dexterity and strength  SENSATION: Patient continues to have some diminished sensation on the medial side of thumb Lateral side was numb but improved  EDEMA: Some increased edema over the thumb thenar eminence  COGNITION: Overall cognitive status: Within functional limits for tasks assessed      TREATMENT DATE: 09/05/23                                                                                                                           Modalities: Contrast bath:  Time: 8 Location: Right hand and wrist Alternate 2 minutes heat in 1 minute cold to decrease inflammation and pain and decrease stiffness prior to review of home exercises. Resting pain improved to 0-1/10.  Prior was 4/10.  Patient to do contrast 2-3 times a day. Prior to composite wrist flexion extension  as well as active assisted range of motion for ulnar radial deviation pain-free 10-12 reps Tendon glides pain-free focusing on motion 10 reps Active range of motion for palmar abduction and radial abduction of the thumb pain-free 10 reps Blocked IP MC flexion of the thumb pain-free 10 reps Prior to opposition to all digits focusing on oval and extension of the thumb in between 5-8 reps pain-free  Reviewed with patient to not lift and pick up and grabs to heavy stuff. Recommend to get a CMC neoprene brace from [place] she is a medium plus to wear during the day for about 24 to 48 hours to decrease resting pain as well as support with activities that causes pain. And then can wean out of it and just use with painful activities.       PATIENT EDUCATION: Education details: findings of eval and HEP  Person educated: Patient Education method: Explanation, Demonstration, Tactile cues, Verbal cues, and Handouts Education comprehension: verbalized understanding, returned demonstration, verbal cues required, and needs further education     GOALS: Goals reviewed with patient? Yes   LONG TERM GOALS: Target date: 8 wks  Patient to be independent in home program to decrease pain to 0-1/10 at rest to be able to increase range of motion to within normal limits Baseline: Resting pain coming in 4/10 can increase to 7/10 with motion and use.  Opposition decreased.  Thumb IP MC flexion decreased thumb radial abduction decreased Goal status: INITIAL  2.  Right thumb radial and palmar abduction strength increased to 5/5 for patient be able to turn a doorknob, pick up half full glass as well as opening jar symptom-free Baseline: Patient pain with range of motion and functional use increased to 7/10 at the thumb.  Decrease thumb radial abduction.  Patient 4 weeks postop Goal status: INITIAL  3.  Right wrist active range of motion improved to within normal limits in all planes as well as strength 5/5 for  patient to push and pull heavy door , turn doorknob doorknob Baseline: Wrist flexion composite decreased 75.  Radial ulnar deviation within functional limits with some discomfort, 4 weeks postop pain improved with use 7/10 Goal status: INITIAL  4.  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  Sleeping with thumb spica as well as wearing it during the day as needed because of pain.  Resting pain 4/10 can increase to 7/10. Goal status: INITIAL  5.  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  Opposition decreased as well as increased pain 4-7/10 in right thumb.  Patient is right-hand dominant. Goal status: INITIAL    ASSESSMENT:  CLINICAL IMPRESSION: Patient seen today for occupational therapy evaluation for right thumb stabilization surgery at the [facility] using tight rope as well as DeQuervain release on 08/05/2020 5 by Dr. Kathlynn.  Patient presented in a thumb spica splint.  Reports she still wears it at nighttime as well as about 25% of the day with activities that causing pain.  Patient resting pain 4/10 can increase to 7/10 mostly in the thumb webspace as well as into the thumb and radial wrist.  Patient showed decreased composite wrist flexion as well as decreased thumb radial abduction and flexion of thumb including opposition.  Patient limited in functional use of right dominant hand in ADLs and IADLs.  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  Patient can benefit from skilled OT services to decrease inflammation and pain and increased motion and  strength.  Recommended for patient to get a CMC neoprene wrap to wear during the day as needed to decrease resting pain to 0-1/10 to be able to focus to advance range of motion and then initiate strengthening.  PERFORMANCE DEFICITS: in functional skills including ADLs, IADLs, ROM, strength, pain, flexibility, decreased knowledge of use of DME, and UE functional use,   and psychosocial skills including environmental adaptation and routines and behaviors.   IMPAIRMENTS: are limiting patient from ADLs, IADLs, rest and sleep, play, leisure, and social participation.   COMORBIDITIES: has no other co-morbidities that affects occupational performance. Patient will benefit from skilled OT to address above impairments and improve overall function.  MODIFICATION OR ASSISTANCE TO COMPLETE EVALUATION: No modification of tasks or assist necessary to complete an evaluation.  OT OCCUPATIONAL PROFILE AND HISTORY: Problem focused assessment: Including review of records relating to presenting problem.  CLINICAL DECISION MAKING: LOW - limited treatment options, no task modification necessary  REHAB POTENTIAL: Good for goals  EVALUATION COMPLEXITY: Low   PLAN:  OT FREQUENCY: 1-2x/week  OT DURATION: 10 weeks  PLANNED INTERVENTIONS: 97168 OT Re-evaluation, 97535 self care/ADL training, 02889 therapeutic exercise, 97530 therapeutic activity, 97112 neuromuscular re-education, 97140 manual therapy, 97018 paraffin, 02960 fluidotherapy, 97010 cryotherapy, 97034 contrast bath, 97033 iontophoresis, 97760 Orthotic Initial, [id number] Orthotic/Prosthetic subsequent, scar mobilization, passive range of motion, patient/family education, and DME and/or AE instructions    CONSULTED AND AGREED WITH PLAN OF CARE: Patient     Ancel Peters, OTR/L,CLT 09/05/2023, 9:04 AM

## 2023-09-05 ENCOUNTER — Encounter: Payer: Self-pay | Admitting: Occupational Therapy

## 2023-09-05 ENCOUNTER — Ambulatory Visit: Attending: Orthopedic Surgery | Admitting: Occupational Therapy

## 2023-09-05 DIAGNOSIS — L408 Other psoriasis: Principal | ICD-10-CM

## 2023-09-05 DIAGNOSIS — L732 Hidradenitis suppurativa: Principal | ICD-10-CM

## 2023-09-05 DIAGNOSIS — M6281 Muscle weakness (generalized): Secondary | ICD-10-CM | POA: Insufficient documentation

## 2023-09-05 DIAGNOSIS — R600 Localized edema: Secondary | ICD-10-CM | POA: Diagnosis not present

## 2023-09-05 DIAGNOSIS — M25641 Stiffness of right hand, not elsewhere classified: Secondary | ICD-10-CM | POA: Diagnosis not present

## 2023-09-05 DIAGNOSIS — M25541 Pain in joints of right hand: Secondary | ICD-10-CM | POA: Diagnosis not present

## 2023-09-09 ENCOUNTER — Ambulatory Visit: Admitting: Occupational Therapy

## 2023-09-09 ENCOUNTER — Encounter: Admitting: Occupational Therapy

## 2023-09-10 ENCOUNTER — Ambulatory Visit: Admitting: Occupational Therapy

## 2023-09-10 DIAGNOSIS — M6281 Muscle weakness (generalized): Secondary | ICD-10-CM | POA: Diagnosis not present

## 2023-09-10 DIAGNOSIS — R600 Localized edema: Secondary | ICD-10-CM

## 2023-09-10 DIAGNOSIS — M25541 Pain in joints of right hand: Secondary | ICD-10-CM | POA: Diagnosis not present

## 2023-09-10 DIAGNOSIS — M25641 Stiffness of right hand, not elsewhere classified: Secondary | ICD-10-CM

## 2023-09-10 DIAGNOSIS — H9212 Otorrhea, left ear: Secondary | ICD-10-CM | POA: Diagnosis not present

## 2023-09-12 ENCOUNTER — Encounter: Admitting: Occupational Therapy

## 2023-09-13 ENCOUNTER — Ambulatory Visit: Admitting: Occupational Therapy

## 2023-09-13 DIAGNOSIS — M25641 Stiffness of right hand, not elsewhere classified: Secondary | ICD-10-CM

## 2023-09-13 DIAGNOSIS — M25541 Pain in joints of right hand: Secondary | ICD-10-CM | POA: Diagnosis not present

## 2023-09-13 DIAGNOSIS — R600 Localized edema: Secondary | ICD-10-CM | POA: Diagnosis not present

## 2023-09-13 DIAGNOSIS — M6281 Muscle weakness (generalized): Secondary | ICD-10-CM

## 2023-09-14 ENCOUNTER — Encounter: Payer: Self-pay | Admitting: Occupational Therapy

## 2023-09-14 NOTE — Therapy (Signed)
 OUTPATIENT OCCUPATIONAL THERAPY ORTHO TREATMENT  Patient Name: Courtney [name] MRN: [id number] DOB:26-Mar-1990, 33 y.o., female Today's Date: 09/14/2023  PCP: Glendia PA REFERRING PROVIDER: Dr Kathlynn  END OF SESSION:  OT End of Session - 09/14/23 1636     Visit Number 2    Number of Visits 12    Date for OT Re-Evaluation 10/31/23    OT Start Time 1110    OT Stop Time 1204    OT Time Calculation (min) 54 min    Activity Tolerance Patient tolerated treatment well    Behavior During Therapy [facility] for tasks assessed/performed          Past Medical History:  Diagnosis Date   ADHD (attention deficit hyperactivity disorder)    Bipolar disorder (HCC)    Body piercing    Nose, frenulum, nipples   Chronic tonsillitis 08/2013   current strep, will finish antibiotic 09/04/2013; snores during sleep, mother denies apnea   Conductive hearing loss of left ear    Heart murmur    when I was born; outgrew it; I was a preemie   Hidradenitis    Hidradenitis 2017   Hx of substance abuse (HCC)    Migraine 12/19/2016   I've just had this one for 3 days (12/20/2016)   Pre-diabetes    Pulmonary emboli (HCC) 07/02/2021   BL   Septic shock (HCC) 07/02/2021   Past Surgical History:  Procedure Laterality Date   ADENOIDECTOMY N/A 10/15/2022   Procedure: ADENOIDECTOMY;  Surgeon: Jesus Oliphant, MD;  Location: [facility];  Service: ENT;  Laterality: N/A;   AXILLARY HIDRADENITIS EXCISION     AXILLARY LYMPH NODE BIOPSY Left 01/06/2019   CARPOMETACARPAL (CMC) FUSION OF THUMB Right 08/06/2023   Procedure: CARPOMETACARPAL [facility]) FUSION OF THUMB;  Surgeon: Kathlynn Sharper, MD;  Location: [facility];  Service: Orthopedics;  Laterality: Right;  Per Dr. Kathlynn, a carpometacarpal arthroplasty, but no bone removed   CYSTOSCOPY W/ URETERAL STENT PLACEMENT Right 05/28/2017   Procedure: CYSTOSCOPY WITH RETROGRADE PYELOGRAM/ RIGHT URETERAL STENT PLACEMENT;  Surgeon: Cam Morene ORN, MD;  Location:  [facility];  Service: Urology;  Laterality: Right;   DORSAL COMPARTMENT RELEASE Right 08/06/2023   Procedure: RELEASE, FIRST DORSAL COMPARTMENT, HAND;  Surgeon: Kathlynn Sharper, MD;  Location: [facility];  Service: Orthopedics;  Laterality: Right;   HYDRADENITIS EXCISION Bilateral 04/03/2017   Procedure: EXCISION AND DRAINAGE BILATERAL  HIDRADENITIS AXILLA;  Surgeon: Curvin Deward MOULD, MD;  Location: [facility];  Service: General;  Laterality: Bilateral;   INCISION AND DRAINAGE ABSCESS Right 07/20/2016   Procedure: INCISION AND DRAINAGE ABSCESS right axilla;  Surgeon: Vernetta Berg, MD;  Location: [facility] OR;  Service: General;  Laterality: Right;   INTRAUTERINE DEVICE (IUD) INSERTION  11/2016   had the one in my left arm removed   IRRIGATION AND DEBRIDEMENT ABSCESS Right 12/21/2016   Procedure: IRRIGATION AND DEBRIDEMENT RIGHT AXILLARY HIDRADENITIS;  Surgeon: Teresa Lonni HERO, MD;  Location: [facility] OR;  Service: General;  Laterality: Right;   IRRIGATION AND DEBRIDEMENT ABSCESS Left 01/08/2017   Procedure: IRRIGATION AND DEBRIDEMENT AXILLARY ABSCESS;  Surgeon: Curvin Deward MOULD, MD;  Location: [facility] OR;  Service: General;  Laterality: Left;   NASAL SEPTOPLASTY W/ TURBINOPLASTY Bilateral 02/25/2019   Procedure: NASAL SEPTOPLASTY WITH TURBINATE REDUCTION;  Surgeon: Jesus Oliphant, MD;  Location: [facility];  Service: ENT;  Laterality: Bilateral;   TONSILLECTOMY Bilateral 09/14/2013   Procedure: BILATERAL TONSILLECTOMY;  Surgeon: Oliphant Jesus, MD;  Location: [facility];  Service: ENT;  Laterality: Bilateral;   TYMPANOPLASTY Bilateral    rebuilt eardrums   WISDOM TOOTH EXTRACTION     Patient Active Problem List   Diagnosis Date Noted   Polypharmacy 01/17/2023   Abnormal urinalysis 01/17/2023   History of substance abuse (HCC) 01/17/2023   Hospital discharge follow-up 01/17/2023   Acute cystitis 01/02/2023   Left ureteral stone 01/02/2023   CAP (community acquired pneumonia)  01/02/2023   Polysubstance abuse (HCC) 01/02/2023   Methamphetamine use (HCC) 11/01/2022   Otitis externa of both ears 05/16/2022   Chronic rhinitis 02/15/2022   Shellfish allergy 02/15/2022   Pulmonary embolism (HCC) 07/19/2021   Hypotension 07/11/2021   Trichomonas vaginitis 07/05/2021   Acute metabolic encephalopathy 07/05/2021   Bilateral pulmonary embolism (HCC) 07/05/2021   Anxiety disorder 06/28/2021   Prediabetes 05/25/2021   Allergy to adhesive tape 05/25/2021   Hidradenitis suppurativa 05/23/2021   Insomnia 03/27/2021   Anemia 12/21/2020   Hypoglycemia 11/17/2020   Bipolar disorder, in full remission, most recent episode mixed (HCC) 10/07/2020   Bipolar disorder, in full remission, most recent episode depressed (HCC) 03/03/2020   Bipolar 2 disorder, major depressive episode (HCC) 08/04/2019   At risk for long QT syndrome 08/04/2019   Obesity (BMI 35.0-39.9 without comorbidity) 03/19/2019   Reactive airway disease 02/11/2019   Conductive hearing loss 03/13/2018   Axillary hidradenitis suppurativa 04/03/2017   Preventative health care 12/06/2016   Vitamin D  deficiency 10/18/2014   Lethargy 07/21/2014   Attention deficit hyperactivity disorder (ADHD), predominantly hyperactive type 02/11/2012    ONSET DATE: 08/06/23  REFERRING DIAG: R DeQuervain release and CMC arthroplasty  THERAPY DIAG:  Pain in joint of right hand  Edema of hand  Stiffness of right hand, not elsewhere classified  Muscle weakness (generalized)  Rationale for Evaluation and Treatment: Rehabilitation  SUBJECTIVE:   SUBJECTIVE STATEMENT: See below in today's treatment note for subjective  Pt accompanied by: self  PERTINENT HISTORY: Dr Kathlynn 08/30/23 Assessment/Plan:   Assessment & Plan Status post [facility] stabilization surgery  Post-surgical follow-up after right CMC stabilization with mini tightrope and De Quervain's release (08/06/23) shows well-healing incisions and good thumb strength. No  infection or complications are present. Encourage scar massage to soften scar tissue. Plan for occupational therapy to improve motion and strength.  Postoperative numbness in hand  Persistent numbness on the ulnar side of the right thumb is noted post-surgery, which is unusual for the affected area. Possible causes include nerve irritation or compression unrelated to the surgical site. Provide carpal tunnel exercises for five minutes twice daily. Encourage scar massage with lotion four to five times daily over all three incisions. Advise against heavy lifting or yard work, but encourage normal activities like feeding herself.  De Quervain's tenosynovitis  Following De Quervain's release, decreased sensation on the ulnar side of the thumb is observed, which is atypical for this procedure. Continue scar massage to aid recovery. Provide education on the difference between [facility] and carpal tunnel syndrome. Diagnoses and all orders for this visit:  De Quervain's tenosynovitis  Chronic or recurrent subluxation of carpometacarpal joint of right thumb   PRECAUTIONS: Splint wearing     WEIGHT BEARING RESTRICTIONS: NWB thru hand  PAIN:  Are you having pain? 4/10 pain thumb and webspace  FALLS: Has patient fallen in last 6 months? 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  NEXT DR APPT:   OBJECTIVE:  Note: Objective measures were completed at Evaluation unless otherwise noted.  HAND DOMINANCE: Right  ADLs:   FUNCTIONAL OUTCOME MEASURES: Next session  UPPER EXTREMITY ROM:     Active ROM Right eval Left eval  Shoulder flexion    Shoulder abduction    Shoulder adduction    Shoulder extension    Shoulder internal rotation    Shoulder external rotation    Elbow flexion    Elbow extension    Wrist flexion 75 fist , 90 open  hand   Wrist extension 70   Wrist ulnar deviation 30   Wrist radial deviation 20   Wrist pronation    Wrist supination    (Blank rows = not tested)  Active ROM Right eval Left eval  Thumb MCP (0-60) 40 65  Thumb IP (0-80) 60 80  Thumb Radial abd/add (0-55) 42    Thumb Palmar abd/add (0-45) 50    Thumb Opposition to Small Finger Pain to 5th     Index MCP (0-90)     Index PIP (0-100)     Index DIP (0-70)      Long MCP (0-90)      Long PIP (0-100)      Long DIP (0-70)      Ring MCP (0-90)      Ring PIP (0-100)      Ring DIP (0-70)      Little MCP (0-90)      Little PIP (0-100)      Little DIP (0-70)      (Blank rows = not tested) Discomfort with composite flexion at the dorsal second metacarpal.  At incision. CMC flexion on the right 30 and left 50. Opposition patient should decrease flexion at the IP and the MCP. Adducting into the carpal tunnel   HAND FUNCTION: Grip strength: Right:   lbs; Left:   lbs, Lateral pinch: Right:   lbs, Left:   lbs, and 3 point pinch: Right:   lbs, Left:   lbs  COORDINATION: 4 weeks postop.  Was in a soft cast in our thumb spica increase stiffness decreased dexterity and strength  SENSATION: Patient continues to have some diminished sensation on the medial side of thumb Lateral side was numb but improved  EDEMA: Some increased edema over the thumb thenar eminence  COGNITION: Overall cognitive status: Within functional limits for tasks assessed  TREATMENT DATE: 09/10/23                                                                                                                            Pt reports she ordered a CMC brace and got the medium but it felt tight and she thinks she should have gotten the medium plus size.  Remeasured patient, she measures a size Med across MPs, she already ordered the plus which should arrive soon.   Fluidotherapy: Fluidotherapy to hand and wrist to decrease  pain, increase motion and tissue mobility for 8  mins.   Manual Therapy:   Following fluidotherapy, pt seen for manual therapy by therapist with soft tissue mobs, scar massage to decrease pain, increase tissue mobility and prevent adhesions.  Therapeutic Exercises:   Composite wrist flexion extension as well as active assisted range of motion for ulnar radial deviation pain-free 10-12 reps Tendon glides pain-free focusing on motion 10 reps Active range of motion for palmar abduction and radial abduction of the thumb pain-free 10 reps Blocked IP MC flexion of the thumb pain-free 10 reps Prior to opposition to all digits focusing on oval and extension of the thumb in between 5-8 reps pain-free  Home program- Patient to do contrast 2-3 times a day. Reinforced with patient to not lift or grasp heavy items. Pt to wear CMC neoprene brace when performing more complex tasks for thumb support.   PATIENT EDUCATION: Education details: findings of eval and HEP  Person educated: Patient Education method: Explanation, Demonstration, Tactile cues, Verbal cues, and Handouts Education comprehension: verbalized understanding, returned demonstration, verbal cues required, and needs further education   GOALS: Goals reviewed with patient? Yes   LONG TERM GOALS: Target date: 8 wks  Patient to be independent in home program to decrease pain to 0-1/10 at rest to be able to increase range of motion to within normal limits Baseline: Resting pain coming in 4/10 can increase to 7/10 with motion and use.  Opposition decreased.  Thumb IP MC flexion decreased thumb radial abduction decreased Goal status: INITIAL  2.  Right thumb radial and palmar abduction strength increased to 5/5 for patient be able to turn a doorknob, pick up half full glass as well as opening jar symptom-free Baseline: Patient pain with range of motion and functional use increased to 7/10 at the thumb.  Decrease thumb radial abduction.  Patient 4 weeks postop Goal status: INITIAL  3.   Right wrist active range of motion improved to within normal limits in all planes as well as strength 5/5 for patient to push and pull heavy door , turn doorknob doorknob Baseline: Wrist flexion composite decreased 75.  Radial ulnar deviation within functional limits with some discomfort, 4 weeks postop pain improved with use 7/10 Goal status: INITIAL  4.  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  Sleeping with thumb spica as well as wearing it during the day as needed because of pain.  Resting pain 4/10 can increase to 7/10. Goal status: INITIAL  5.  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  Opposition decreased as well as increased pain 4-7/10 in right thumb.  Patient is right-hand dominant. Goal status: INITIAL  ASSESSMENT:  CLINICAL IMPRESSION: Patient seen for occupational therapy evaluation for right thumb stabilization surgery at the [facility] using tight rope as well as DeQuervain release on 08/05/2020 5 by Dr. Kathlynn.  Patient presented in a thumb spica splint.  Reports she still wears it at nighttime as well as about 25% of the day with activities that causing pain. Patient demonstrated decreased composite wrist flexion as  well as decreased thumb radial abduction and flexion of thumb including opposition.  Pt with decreased pain this date overall, reports contrast helps at home and demonstrates increased motion. Responds well to cues for exercises.  Pt feels med CMC brace is too small, reordered a medium plus and waiting for it to arrive.  She now measures a  size med per chart sizing.  Continue current plan of care to focus on edema management, ROM, scar management.  Patient limited in functional use of right dominant hand in ADLs and IADLs.  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  Patient can benefit from skilled OT services to decrease inflammation and pain and increased motion and strength.  Recommended for patient to get a CMC neoprene wrap to wear during the day as needed to decrease resting pain to 0-1/10 to be able to focus to advance range of motion and then initiate strengthening.  PERFORMANCE DEFICITS: in functional skills including ADLs, IADLs, ROM, strength, pain, flexibility, decreased knowledge of use of DME, and UE functional use,   and psychosocial skills including environmental adaptation and routines and behaviors.   IMPAIRMENTS: are limiting patient from ADLs, IADLs, rest and sleep, play, leisure, and social participation.   COMORBIDITIES: has no other co-morbidities that affects occupational performance. Patient will benefit from skilled OT to address above impairments and improve overall function.  MODIFICATION OR ASSISTANCE TO COMPLETE EVALUATION: No modification of tasks or assist necessary to complete an evaluation.  OT OCCUPATIONAL PROFILE AND HISTORY: Problem focused assessment: Including review of records relating to presenting problem.  CLINICAL DECISION MAKING: LOW - limited treatment options, no task modification necessary  REHAB POTENTIAL: Good for goals  EVALUATION COMPLEXITY: Low   PLAN:  OT FREQUENCY: 1-2x/week  OT DURATION: 10 weeks  PLANNED INTERVENTIONS: 97168 OT Re-evaluation, 97535 self care/ADL training, 02889 therapeutic exercise, 97530 therapeutic activity, 97112 neuromuscular re-education, 97140 manual therapy, 97018 paraffin, 02960 fluidotherapy, 97010 cryotherapy, 97034 contrast bath, 97033 iontophoresis, 97760 Orthotic Initial, [id number] Orthotic/Prosthetic subsequent, scar  mobilization, passive range of motion, patient/family education, and DME and/or AE instructions  CONSULTED AND AGREED WITH PLAN OF CARE: Patient  Sherlon No, OTR/L,CLT 09/14/2023, 4:37 PM

## 2023-09-14 NOTE — Therapy (Signed)
 OUTPATIENT OCCUPATIONAL THERAPY ORTHO TREATMENT  Patient Name: Courtney [name] MRN: [id number] DOB:March 04, 1991, 33 y.o., female Today's Date: 09/17/2023  PCP: Glendia PA REFERRING PROVIDER: Dr Kathlynn  END OF SESSION:  OT End of Session - 09/17/23 1056     Visit Number 3    Number of Visits 12    Date for OT Re-Evaluation 10/31/23    OT Start Time 1032    OT Stop Time 1120    OT Time Calculation (min) 48 min    Activity Tolerance Patient tolerated treatment well    Behavior During Therapy [facility] for tasks assessed/performed           Past Medical History:  Diagnosis Date   ADHD (attention deficit hyperactivity disorder)    Bipolar disorder (HCC)    Body piercing    Nose, frenulum, nipples   Chronic tonsillitis 08/2013   current strep, will finish antibiotic 09/04/2013; snores during sleep, mother denies apnea   Conductive hearing loss of left ear    Heart murmur    when I was born; outgrew it; I was a preemie   Hidradenitis    Hidradenitis 2017   Hx of substance abuse (HCC)    Migraine 12/19/2016   I've just had this one for 3 days (12/20/2016)   Pre-diabetes    Pulmonary emboli (HCC) 07/02/2021   BL   Septic shock (HCC) 07/02/2021   Past Surgical History:  Procedure Laterality Date   ADENOIDECTOMY N/A 10/15/2022   Procedure: ADENOIDECTOMY;  Surgeon: Jesus Oliphant, MD;  Location: [facility];  Service: ENT;  Laterality: N/A;   AXILLARY HIDRADENITIS EXCISION     AXILLARY LYMPH NODE BIOPSY Left 01/06/2019   CARPOMETACARPAL (CMC) FUSION OF THUMB Right 08/06/2023   Procedure: CARPOMETACARPAL [facility]) FUSION OF THUMB;  Surgeon: Kathlynn Sharper, MD;  Location: [facility];  Service: Orthopedics;  Laterality: Right;  Per Dr. Kathlynn, a carpometacarpal arthroplasty, but no bone removed   CYSTOSCOPY W/ URETERAL STENT PLACEMENT Right 05/28/2017   Procedure: CYSTOSCOPY WITH RETROGRADE PYELOGRAM/ RIGHT URETERAL STENT PLACEMENT;  Surgeon: Cam Morene ORN, MD;   Location: [facility];  Service: Urology;  Laterality: Right;   DORSAL COMPARTMENT RELEASE Right 08/06/2023   Procedure: RELEASE, FIRST DORSAL COMPARTMENT, HAND;  Surgeon: Kathlynn Sharper, MD;  Location: [facility];  Service: Orthopedics;  Laterality: Right;   HYDRADENITIS EXCISION Bilateral 04/03/2017   Procedure: EXCISION AND DRAINAGE BILATERAL  HIDRADENITIS AXILLA;  Surgeon: Curvin Deward MOULD, MD;  Location: [facility] OR;  Service: General;  Laterality: Bilateral;   INCISION AND DRAINAGE ABSCESS Right 07/20/2016   Procedure: INCISION AND DRAINAGE ABSCESS right axilla;  Surgeon: Vernetta Berg, MD;  Location: [facility] OR;  Service: General;  Laterality: Right;   INTRAUTERINE DEVICE (IUD) INSERTION  11/2016   had the one in my left arm removed   IRRIGATION AND DEBRIDEMENT ABSCESS Right 12/21/2016   Procedure: IRRIGATION AND DEBRIDEMENT RIGHT AXILLARY HIDRADENITIS;  Surgeon: Teresa Lonni HERO, MD;  Location: [facility] OR;  Service: General;  Laterality: Right;   IRRIGATION AND DEBRIDEMENT ABSCESS Left 01/08/2017   Procedure: IRRIGATION AND DEBRIDEMENT AXILLARY ABSCESS;  Surgeon: Curvin Deward MOULD, MD;  Location: [facility] OR;  Service: General;  Laterality: Left;   NASAL SEPTOPLASTY W/ TURBINOPLASTY Bilateral 02/25/2019   Procedure: NASAL SEPTOPLASTY WITH TURBINATE REDUCTION;  Surgeon: Jesus Oliphant, MD;  Location: [facility];  Service: ENT;  Laterality: Bilateral;   TONSILLECTOMY Bilateral 09/14/2013   Procedure: BILATERAL TONSILLECTOMY;  Surgeon: Oliphant Jesus, MD;  Location:   SURGERY CENTER;  Service: ENT;  Laterality: Bilateral;   TYMPANOPLASTY Bilateral    rebuilt eardrums   WISDOM TOOTH EXTRACTION     Patient Active Problem List   Diagnosis Date Noted   Polypharmacy 01/17/2023   Abnormal urinalysis 01/17/2023   History of substance abuse (HCC) 01/17/2023   Hospital discharge follow-up 01/17/2023   Acute cystitis 01/02/2023   Left ureteral stone 01/02/2023   CAP (community acquired  pneumonia) 01/02/2023   Polysubstance abuse (HCC) 01/02/2023   Methamphetamine use (HCC) 11/01/2022   Otitis externa of both ears 05/16/2022   Chronic rhinitis 02/15/2022   Shellfish allergy 02/15/2022   Pulmonary embolism (HCC) 07/19/2021   Hypotension 07/11/2021   Trichomonas vaginitis 07/05/2021   Acute metabolic encephalopathy 07/05/2021   Bilateral pulmonary embolism (HCC) 07/05/2021   Anxiety disorder 06/28/2021   Prediabetes 05/25/2021   Allergy to adhesive tape 05/25/2021   Hidradenitis suppurativa 05/23/2021   Insomnia 03/27/2021   Anemia 12/21/2020   Hypoglycemia 11/17/2020   Bipolar disorder, in full remission, most recent episode mixed (HCC) 10/07/2020   Bipolar disorder, in full remission, most recent episode depressed (HCC) 03/03/2020   Bipolar 2 disorder, major depressive episode (HCC) 08/04/2019   At risk for long QT syndrome 08/04/2019   Obesity (BMI 35.0-39.9 without comorbidity) 03/19/2019   Reactive airway disease 02/11/2019   Conductive hearing loss 03/13/2018   Axillary hidradenitis suppurativa 04/03/2017   Preventative health care 12/06/2016   Vitamin D  deficiency 10/18/2014   Lethargy 07/21/2014   Attention deficit hyperactivity disorder (ADHD), predominantly hyperactive type 02/11/2012    ONSET DATE: 08/06/23  REFERRING DIAG: R DeQuervain release and CMC arthroplasty  THERAPY DIAG:  Pain in joint of right hand  Edema of hand  Stiffness of right hand, not elsewhere classified  Muscle weakness (generalized)  Rationale for Evaluation and Treatment: Rehabilitation  SUBJECTIVE:   SUBJECTIVE STATEMENT: See below in today's treatment note for subjective  Pt accompanied by: self  PERTINENT HISTORY: Dr Kathlynn 08/30/23 Assessment/Plan:   Assessment & Plan Status post [facility] stabilization surgery  Post-surgical follow-up after right CMC stabilization with mini tightrope and De Quervain's release (08/06/23) shows well-healing incisions and good thumb  strength. No infection or complications are present. Encourage scar massage to soften scar tissue. Plan for occupational therapy to improve motion and strength.  Postoperative numbness in hand  Persistent numbness on the ulnar side of the right thumb is noted post-surgery, which is unusual for the affected area. Possible causes include nerve irritation or compression unrelated to the surgical site. Provide carpal tunnel exercises for five minutes twice daily. Encourage scar massage with lotion four to five times daily over all three incisions. Advise against heavy lifting or yard work, but encourage normal activities like feeding herself.  De Quervain's tenosynovitis  Following De Quervain's release, decreased sensation on the ulnar side of the thumb is observed, which is atypical for this procedure. Continue scar massage to aid recovery. Provide education on the difference between [facility] and carpal tunnel syndrome. Diagnoses and all orders for this visit:  De Quervain's tenosynovitis  Chronic or recurrent subluxation of carpometacarpal joint of right thumb   PRECAUTIONS: Splint wearing     WEIGHT BEARING RESTRICTIONS: NWB thru hand  PAIN:  Are you having pain? 4/10 pain thumb and webspace  FALLS: Has patient fallen in last 6 months? 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  NEXT DR APPT:   OBJECTIVE:  Note: Objective measures were completed at Evaluation unless otherwise noted.  HAND DOMINANCE: Right  ADLs:   FUNCTIONAL OUTCOME MEASURES: Next session  UPPER EXTREMITY ROM:     Active ROM Right eval Left eval  Shoulder flexion    Shoulder abduction    Shoulder adduction    Shoulder extension    Shoulder internal rotation    Shoulder external rotation    Elbow flexion    Elbow extension    Wrist flexion 75  fist , 90 open hand   Wrist extension 70   Wrist ulnar deviation 30   Wrist radial deviation 20   Wrist pronation    Wrist supination    (Blank rows = not tested)  Active ROM Right eval Left eval  Thumb MCP (0-60) 40 65  Thumb IP (0-80) 60 80  Thumb Radial abd/add (0-55) 42    Thumb Palmar abd/add (0-45) 50    Thumb Opposition to Small Finger Pain to 5th     Index MCP (0-90)     Index PIP (0-100)     Index DIP (0-70)      Long MCP (0-90)      Long PIP (0-100)      Long DIP (0-70)      Ring MCP (0-90)      Ring PIP (0-100)      Ring DIP (0-70)      Little MCP (0-90)      Little PIP (0-100)      Little DIP (0-70)      (Blank rows = not tested) Discomfort with composite flexion at the dorsal second metacarpal.  At incision. CMC flexion on the right 30 and left 50. Opposition patient should decrease flexion at the IP and the MCP. Adducting into the carpal tunnel   HAND FUNCTION: Grip strength: Right:   lbs; Left:   lbs, Lateral pinch: Right:   lbs, Left:   lbs, and 3 point pinch: Right:   lbs, Left:   lbs  COORDINATION: 4 weeks postop.  Was in a soft cast in our thumb spica increase stiffness decreased dexterity and strength  SENSATION: Patient continues to have some diminished sensation on the medial side of thumb Lateral side was numb but improved  EDEMA: Some increased edema over the thumb thenar eminence  COGNITION: Overall cognitive status: Within functional limits for tasks assessed  TREATMENT DATE: 09/13/23                                                                                                                           Pt reports she brace she ordered is delayed, she is hoping it will arrive soon, she has been wearing the medium and waiting on med plus.   Pt reports 2/10 pain and feels her overall pain and motion has improved.  She has been working on some handwriting at home to improve legibility.  Fluidotherapy: Fluidotherapy to hand and wrist to  decrease pain, increase motion and tissue mobility for 8 mins. Pt performing ROM of hand and wrist actively while in fluidotherapy.  Manual Therapy:   Following fluidotherapy, pt seen for manual therapy by therapist with soft tissue mobs, scar massage to decrease pain, increase tissue mobility and prevent adhesions.    Therapeutic Exercises:   Following manual therapy, pt seen for focus on composite wrist flexion extension as well as active assisted range of motion for ulnar radial deviation pain-free 10-12 reps, 2 sets Tendon glides pain-free focusing on motion 10 reps, 2 sets Active range of motion for palmar abduction and radial abduction of the thumb pain-free 10 reps, 2 sets Blocked IP MC flexion of the thumb pain-free 10 reps, 2 sets Opposition to all digits focusing on oval and extension of the thumb in between 5-8 reps.   Home program- Patient to continue contrast 2-3 times a day for edema control and management. Reinforced with patient to not lift or grasp heavy items. Pt to wear CMC neoprene brace when performing more complex tasks for thumb support, she is waiting for new brace to arrive.     PATIENT EDUCATION: Education details: findings of eval and HEP  Person educated: Patient Education method: Explanation, Demonstration, Tactile cues, Verbal cues, and Handouts Education comprehension: verbalized understanding, returned demonstration, verbal cues required, and needs further education   GOALS: Goals reviewed with patient? Yes   LONG TERM GOALS: Target date: 8 wks  Patient to be independent in home program to decrease pain to 0-1/10 at rest to be able to increase range of motion to within normal limits Baseline: Resting pain coming in 4/10 can increase to 7/10 with motion and use.  Opposition decreased.  Thumb IP MC flexion decreased thumb radial abduction decreased Goal status: INITIAL  2.  Right thumb radial and palmar abduction strength increased to 5/5 for patient be  able to turn a doorknob, pick up half full glass as well as opening jar symptom-free Baseline: Patient pain with range of motion and functional use increased to 7/10 at the thumb.  Decrease thumb radial abduction.  Patient 4 weeks postop Goal status: INITIAL  3.  Right wrist active range of motion improved to within normal limits in all planes as well as strength 5/5 for patient to push and pull heavy door , turn doorknob doorknob Baseline: Wrist flexion composite decreased 75.  Radial ulnar deviation within functional limits with some discomfort, 4 weeks postop pain improved with use 7/10 Goal status: INITIAL  4.  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  Sleeping with thumb spica as well as wearing it during the day as needed because of pain.  Resting pain 4/10 can increase to 7/10. Goal status: INITIAL  5.  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  Opposition decreased as well as increased pain 4-7/10 in right thumb.  Patient is right-hand dominant. Goal status: INITIAL  ASSESSMENT:  CLINICAL IMPRESSION: Patient seen for occupational therapy evaluation for right thumb stabilization surgery at the [facility] using  tight rope as well as DeQuervain release on 08/05/2020 5 by Dr. Kathlynn.  Patient presented in a thumb spica splint.  Reports she still wears it at nighttime as well as about 25% of the day with activities that causing pain. Patient demonstrated decreased composite wrist flexion as well as decreased thumb radial abduction and flexion of thumb including  opposition.  Pt with decreased pain this date overall, reports contrast helps at home and demonstrates increased motion. Responds well to cues for exercises.  Pt feels med CMC brace is too small, reordered a medium plus and waiting for it to arrive.  She measures a size med per chart sizing.  Pt continues to demonstrate decreased pain overall, now 2/10 with use and no pain at rest.  She demonstrates improved ROM and functional use, working on handwriting at home for improved legibility.  Continue current plan of care to focus on edema management, ROM, scar management.  Will plan to initiate strengthening in the next few visits. Patient limited in functional use of right dominant hand in ADLs and IADLs.  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  Patient can benefit from skilled OT services to decrease inflammation and pain and increased motion and strength.   PERFORMANCE DEFICITS: in functional skills including ADLs, IADLs, ROM, strength, pain, flexibility, decreased knowledge of use of DME, and UE functional use,   and psychosocial skills including environmental adaptation and routines and behaviors.   IMPAIRMENTS: are limiting patient from ADLs, IADLs, rest and sleep, play, leisure, and social participation.   COMORBIDITIES: has no other co-morbidities that affects occupational performance. Patient will benefit from skilled OT to address above impairments and improve overall function.  MODIFICATION OR ASSISTANCE TO COMPLETE EVALUATION: No modification of tasks or assist necessary to complete an evaluation.  OT OCCUPATIONAL PROFILE AND HISTORY: Problem focused assessment: Including review of records relating to presenting problem.  CLINICAL DECISION MAKING: LOW - limited treatment options, no task modification necessary  REHAB POTENTIAL: Good for goals  EVALUATION COMPLEXITY: Low   PLAN:  OT FREQUENCY: 1-2x/week  OT DURATION: 10 weeks  PLANNED INTERVENTIONS: 97168 OT  Re-evaluation, 97535 self care/ADL training, 02889 therapeutic exercise, 97530 therapeutic activity, 97112 neuromuscular re-education, 97140 manual therapy, 97018 paraffin, 02960 fluidotherapy, 97010 cryotherapy, 97034 contrast bath, 97033 iontophoresis, 97760 Orthotic Initial, [id number] Orthotic/Prosthetic subsequent, scar mobilization, passive range of motion, patient/family education, and DME and/or AE instructions  CONSULTED AND AGREED WITH PLAN OF CARE: Patient  Joseandres Mazer, OTR/L,CLT 09/17/2023, 10:57 AM

## 2023-09-17 ENCOUNTER — Ambulatory Visit: Admitting: Occupational Therapy

## 2023-09-24 ENCOUNTER — Ambulatory Visit: Admitting: Occupational Therapy

## 2023-09-24 DIAGNOSIS — M25541 Pain in joints of right hand: Secondary | ICD-10-CM | POA: Diagnosis not present

## 2023-09-24 DIAGNOSIS — M6281 Muscle weakness (generalized): Secondary | ICD-10-CM | POA: Diagnosis not present

## 2023-09-24 DIAGNOSIS — R600 Localized edema: Secondary | ICD-10-CM

## 2023-09-24 DIAGNOSIS — M25641 Stiffness of right hand, not elsewhere classified: Secondary | ICD-10-CM | POA: Diagnosis not present

## 2023-09-24 NOTE — Therapy (Signed)
 OUTPATIENT OCCUPATIONAL THERAPY ORTHO TREATMENT  Patient Name: Courtney [name] MRN: [id number] DOB:March 06, 1990, 33 y.o., female Today's Date: 09/24/2023  PCP: Glendia PA REFERRING PROVIDER: Dr Kathlynn  END OF SESSION:  OT End of Session - 09/24/23 1205     Visit Number 4    Number of Visits 12    Date for OT Re-Evaluation 10/31/23    OT Start Time 1200    OT Stop Time 1244    OT Time Calculation (min) 44 min    Activity Tolerance Patient tolerated treatment well    Behavior During Therapy [facility] for tasks assessed/performed           Past Medical History:  Diagnosis Date   ADHD (attention deficit hyperactivity disorder)    Bipolar disorder (HCC)    Body piercing    Nose, frenulum, nipples   Chronic tonsillitis 08/2013   current strep, will finish antibiotic 09/04/2013; snores during sleep, mother denies apnea   Conductive hearing loss of left ear    Heart murmur    when I was born; outgrew it; I was a preemie   Hidradenitis    Hidradenitis 2017   Hx of substance abuse (HCC)    Migraine 12/19/2016   I've just had this one for 3 days (12/20/2016)   Pre-diabetes    Pulmonary emboli (HCC) 07/02/2021   BL   Septic shock (HCC) 07/02/2021   Past Surgical History:  Procedure Laterality Date   ADENOIDECTOMY N/A 10/15/2022   Procedure: ADENOIDECTOMY;  Surgeon: Jesus Oliphant, MD;  Location: [facility];  Service: ENT;  Laterality: N/A;   AXILLARY HIDRADENITIS EXCISION     AXILLARY LYMPH NODE BIOPSY Left 01/06/2019   CARPOMETACARPAL (CMC) FUSION OF THUMB Right 08/06/2023   Procedure: CARPOMETACARPAL [facility]) FUSION OF THUMB;  Surgeon: Kathlynn Sharper, MD;  Location: [facility];  Service: Orthopedics;  Laterality: Right;  Per Dr. Kathlynn, a carpometacarpal arthroplasty, but no bone removed   CYSTOSCOPY W/ URETERAL STENT PLACEMENT Right 05/28/2017   Procedure: CYSTOSCOPY WITH RETROGRADE PYELOGRAM/ RIGHT URETERAL STENT PLACEMENT;  Surgeon: Cam Morene ORN, MD;   Location: [facility];  Service: Urology;  Laterality: Right;   DORSAL COMPARTMENT RELEASE Right 08/06/2023   Procedure: RELEASE, FIRST DORSAL COMPARTMENT, HAND;  Surgeon: Kathlynn Sharper, MD;  Location: [facility];  Service: Orthopedics;  Laterality: Right;   HYDRADENITIS EXCISION Bilateral 04/03/2017   Procedure: EXCISION AND DRAINAGE BILATERAL  HIDRADENITIS AXILLA;  Surgeon: Curvin Deward MOULD, MD;  Location: [facility] OR;  Service: General;  Laterality: Bilateral;   INCISION AND DRAINAGE ABSCESS Right 07/20/2016   Procedure: INCISION AND DRAINAGE ABSCESS right axilla;  Surgeon: Vernetta Berg, MD;  Location: [facility] OR;  Service: General;  Laterality: Right;   INTRAUTERINE DEVICE (IUD) INSERTION  11/2016   had the one in my left arm removed   IRRIGATION AND DEBRIDEMENT ABSCESS Right 12/21/2016   Procedure: IRRIGATION AND DEBRIDEMENT RIGHT AXILLARY HIDRADENITIS;  Surgeon: Teresa Lonni HERO, MD;  Location: [facility] OR;  Service: General;  Laterality: Right;   IRRIGATION AND DEBRIDEMENT ABSCESS Left 01/08/2017   Procedure: IRRIGATION AND DEBRIDEMENT AXILLARY ABSCESS;  Surgeon: Curvin Deward MOULD, MD;  Location: [facility] OR;  Service: General;  Laterality: Left;   NASAL SEPTOPLASTY W/ TURBINOPLASTY Bilateral 02/25/2019   Procedure: NASAL SEPTOPLASTY WITH TURBINATE REDUCTION;  Surgeon: Jesus Oliphant, MD;  Location: [facility];  Service: ENT;  Laterality: Bilateral;   TONSILLECTOMY Bilateral 09/14/2013   Procedure: BILATERAL TONSILLECTOMY;  Surgeon: Oliphant Jesus, MD;  Location: [facility];  Service: ENT;  Laterality: Bilateral;   TYMPANOPLASTY Bilateral    rebuilt eardrums   WISDOM TOOTH EXTRACTION     Patient Active Problem List   Diagnosis Date Noted   Polypharmacy 01/17/2023   Abnormal urinalysis 01/17/2023   History of substance abuse (HCC) 01/17/2023   Hospital discharge follow-up 01/17/2023   Acute cystitis 01/02/2023   Left ureteral stone 01/02/2023   CAP (community acquired  pneumonia) 01/02/2023   Polysubstance abuse (HCC) 01/02/2023   Methamphetamine use (HCC) 11/01/2022   Otitis externa of both ears 05/16/2022   Chronic rhinitis 02/15/2022   Shellfish allergy 02/15/2022   Pulmonary embolism (HCC) 07/19/2021   Hypotension 07/11/2021   Trichomonas vaginitis 07/05/2021   Acute metabolic encephalopathy 07/05/2021   Bilateral pulmonary embolism (HCC) 07/05/2021   Anxiety disorder 06/28/2021   Prediabetes 05/25/2021   Allergy to adhesive tape 05/25/2021   Hidradenitis suppurativa 05/23/2021   Insomnia 03/27/2021   Anemia 12/21/2020   Hypoglycemia 11/17/2020   Bipolar disorder, in full remission, most recent episode mixed (HCC) 10/07/2020   Bipolar disorder, in full remission, most recent episode depressed (HCC) 03/03/2020   Bipolar 2 disorder, major depressive episode (HCC) 08/04/2019   At risk for long QT syndrome 08/04/2019   Obesity (BMI 35.0-39.9 without comorbidity) 03/19/2019   Reactive airway disease 02/11/2019   Conductive hearing loss 03/13/2018   Axillary hidradenitis suppurativa 04/03/2017   Preventative health care 12/06/2016   Vitamin D  deficiency 10/18/2014   Lethargy 07/21/2014   Attention deficit hyperactivity disorder (ADHD), predominantly hyperactive type 02/11/2012    ONSET DATE: 08/06/23  REFERRING DIAG: R DeQuervain release and CMC arthroplasty  THERAPY DIAG:  Pain in joint of right hand  Edema of hand  Stiffness of right hand, not elsewhere classified  Muscle weakness (generalized)  Rationale for Evaluation and Treatment: Rehabilitation  SUBJECTIVE:   SUBJECTIVE STATEMENT: I played with my nephew yesterday.  So I think that is why my hand is sore today.  But before that it is doing good.  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 No infection or complications are present. Encourage scar massage to soften scar tissue. Plan for occupational therapy to improve motion and strength.  Postoperative numbness in hand  Persistent numbness on the ulnar side of the right thumb is noted post-surgery, which is unusual for the affected area. Possible causes include nerve irritation or compression unrelated to the surgical site. Provide carpal tunnel exercises for five minutes twice daily. Encourage scar massage with lotion four to five times daily over all three incisions. Advise against heavy lifting or yard work, but encourage normal activities like feeding herself.  De Quervain's tenosynovitis  Following De Quervain's release, decreased sensation on the ulnar side of the thumb is observed, which is atypical for this procedure. Continue scar massage to aid recovery. Provide education on the difference between [facility] and carpal tunnel syndrome. Diagnoses and all orders for this visit:  De Quervain's tenosynovitis  Chronic or recurrent subluxation of carpometacarpal joint of right thumb   PRECAUTIONS: Splint wearing     WEIGHT BEARING RESTRICTIONS: NWB thru hand  PAIN:  Are you having pain? 4/10 pain thumb and webspace  FALLS: Has patient fallen in last 6 months? 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  NEXT DR APPT:   OBJECTIVE:  Note: Objective measures were completed at Evaluation unless otherwise noted.  HAND DOMINANCE: Right  ADLs:   FUNCTIONAL OUTCOME MEASURES: Next session  UPPER EXTREMITY ROM:     Active ROM Right eval Left eval  Shoulder flexion    Shoulder abduction    Shoulder adduction    Shoulder extension     Shoulder internal rotation    Shoulder external rotation    Elbow flexion    Elbow extension    Wrist flexion 75 fist , 90 open hand   Wrist extension 70   Wrist ulnar deviation 30   Wrist radial deviation 20   Wrist pronation    Wrist supination    (Blank rows = not tested)  Active ROM Right eval Left eval  Thumb MCP (0-60) 40 65  Thumb IP (0-80) 60 80  Thumb Radial abd/add (0-55) 42    Thumb Palmar abd/add (0-45) 50    Thumb Opposition to Small Finger Pain to 5th     Index MCP (0-90)     Index PIP (0-100)     Index DIP (0-70)      Long MCP (0-90)      Long PIP (0-100)      Long DIP (0-70)      Ring MCP (0-90)      Ring PIP (0-100)      Ring DIP (0-70)      Little MCP (0-90)      Little PIP (0-100)      Little DIP (0-70)      (Blank rows = not tested) Discomfort with composite flexion at the dorsal second metacarpal.  At incision. CMC flexion on the right 30 and left 50. Opposition patient should decrease flexion at the IP and the MCP. Adducting into the carpal tunnel   HAND FUNCTION: 09/24/23 Grip strength: Right: 48 lbs; Left: 70 lbs, Lateral pinch: Right: 9 lbs, Left: 13 lbs, and 3 point pinch: Right: 11 lbs, Left: 15 lbs  COORDINATION:  At eval 4 weeks postop.  Was in a soft cast in our thumb spica increase stiffness decreased dexterity and strength  SENSATION: Patient continues to have some diminished sensation on the medial side of thumb Lateral side was numb but improved  EDEMA: Some increased edema over the thumb thenar eminence  COGNITION: Overall cognitive status: Within functional limits for tasks assessed  TREATMENT DATE: 09/24/23                                                                                                                           Pt reports she brace she ordered is delayed, she is hoping it will arrive soon, she has been wearing the medium and waiting on med plus.   Pt reports 4/10  pain and feels her overall pain and motion  has improved.  Pain was doing good but then she played with her nephew yesterday.  Measurements taken for right wrist and digits.  As well as strength. Active range of motion for thumb palmar radial abduction within normal limits.  As well as opposition to distal palmar crease Strength to opposition to fifth 5/5 Strength in wrist and forearm 5/5 except supination pronation 4+/5 As well as wrist extension and flexion 5/5 but some discomfort Decree strength in thumb palmar radial abduction. Added this date rubber bands for palmar and radial abduction 12 reps to 15 reps pain-free 2 times a day Grip and prehension strength assessed see flowsheet.-Some discomfort and pain with prehension  Fluidotherapy: Fluidotherapy to hand and wrist to decrease pain, increase motion and tissue mobility for 8 mins.  1 rotation of ice done to decrease pain and edema.  Patient t performing ROM of hand and wrist actively while in fluidotherapy.     Therapeutic Exercises:    Tendon glides pain-free focusing on motion 10 reps, 2 sets Active range of motion for palmar abduction and radial abduction of the thumb pain-free 10 reps, 2 sets Opposition to all digits focusing on oval and extension of the thumb in between 5-8 reps.  Rubber band for palmar radial abduction 12-15 reps 2 times a day.  Patient can increase in 2 to 3 days if pain-free to second set. Light blue putty for gripping and 3 point and lateral pinch.  12-15 reps 2 times a day pain-free Patient tolerated well. 16 ounce hammer for supination pronation as well as radial ulnar deviation 12-15 reps pain-free 2 times a day. All above added to home exercises.  Patient tolerating well.  Home program- Patient to continue contrast 2-3 times a day for edema control and management. Reinforced with patient to not lift or grasp heavy items. Pt to wear CMC neoprene brace when performing more complex tasks for thumb support, she is waiting for new brace to arrive.      PATIENT EDUCATION: Education details: findings of eval and HEP  Person educated: Patient Education method: Explanation, Demonstration, Tactile cues, Verbal cues, and Handouts Education comprehension: verbalized understanding, returned demonstration, verbal cues required, and needs further education   GOALS: Goals reviewed with patient? Yes   LONG TERM GOALS: Target date: 8 wks  Patient to be independent in home program to decrease pain to 0-1/10 at rest to be able to increase range of motion to within normal limits Baseline: Resting pain coming in 4/10 can increase to 7/10 with motion and use.  Opposition decreased.  Thumb IP MC flexion decreased thumb radial abduction decreased Goal status: INITIAL  2.  Right thumb radial and palmar abduction strength increased to 5/5 for patient be able to turn a doorknob, pick up half full glass as well as opening jar symptom-free Baseline: Patient pain with range of motion and functional use increased to 7/10 at the thumb.  Decrease thumb radial abduction.  Patient 4 weeks postop Goal status: INITIAL  3.  Right wrist active range of motion improved to within normal limits in all planes as well as strength 5/5 for patient to push and pull heavy door , turn doorknob doorknob Baseline: Wrist flexion composite decreased 75.  Radial ulnar deviation within functional limits with some discomfort, 4 weeks postop pain improved with use 7/10 Goal status: INITIAL  4.  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  Sleeping with thumb spica as well as wearing it during the day as needed because of pain.  Resting pain 4/10 can increase to 7/10. Goal status: INITIAL  5.  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  Opposition decreased as well as increased pain 4-7/10 in right thumb.  Patient is right-hand dominant. Goal status: INITIAL  ASSESSMENT:  CLINICAL IMPRESSION: Patient seen for occupational therapy  for right thumb stabilization surgery at the [facility] using tight rope as well as DeQuervain release on 08/05/2020 5 by Dr. Kathlynn.  Patient with increased soreness today because of playing with nephew yesterday./5/10.  But decreased after fluidotherapy.  Contrast helps at home and demonstrates increased motion. Responds well to cues for exercises.  Pt feels med CMC brace is too small, reordered a medium plus and waiting for it to arrive.   She demonstrates improved ROM and functional use, working on handwriting at home for improved legibility.  Assessed this date progress in active range of motion as well as baseline for strength.  Initiate strengthening for wrist as well as thumb palmar radial abduction.  As well as grip and prevention.  Patient tolerating well in clinic.  Patient limited in functional use of right dominant hand in ADLs and IADLs.  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  Patient can benefit from skilled OT services to decrease inflammation and pain and increased motion and strength.   PERFORMANCE DEFICITS: in functional skills including ADLs, IADLs, ROM, strength, pain, flexibility, decreased knowledge of use of DME, and UE functional use,   and psychosocial skills including environmental adaptation and routines and behaviors.   IMPAIRMENTS: are limiting patient from ADLs, IADLs, rest and sleep, play, leisure, and social participation.   COMORBIDITIES: has no other co-morbidities that affects occupational performance. Patient will benefit from skilled OT to address above impairments and  improve overall function.  MODIFICATION OR ASSISTANCE TO COMPLETE EVALUATION: No modification of tasks or assist necessary to complete an evaluation.  OT OCCUPATIONAL PROFILE AND HISTORY: Problem focused assessment: Including review of records relating to presenting problem.  CLINICAL DECISION MAKING: LOW - limited treatment options, no task modification necessary  REHAB POTENTIAL: Good for goals  EVALUATION COMPLEXITY: Low   PLAN:  OT FREQUENCY: 1-2x/week  OT DURATION: 10 weeks  PLANNED INTERVENTIONS: 97168 OT Re-evaluation, 97535 self care/ADL training, 02889 therapeutic exercise, 97530 therapeutic activity, 97112 neuromuscular re-education, 97140 manual therapy, 97018 paraffin, 02960 fluidotherapy, 97010 cryotherapy, 97034 contrast bath, 97033 iontophoresis, 97760 Orthotic Initial, [id number] Orthotic/Prosthetic subsequent, scar mobilization, passive range of motion, patient/family education, and DME and/or AE instructions  CONSULTED AND AGREED WITH PLAN OF CARE: Patient  Ancel Peters, OTR/L,CLT 09/24/2023, 1:00 PM

## 2023-09-27 ENCOUNTER — Ambulatory Visit: Admitting: Occupational Therapy

## 2023-09-27 DIAGNOSIS — M6281 Muscle weakness (generalized): Secondary | ICD-10-CM

## 2023-09-27 DIAGNOSIS — M25541 Pain in joints of right hand: Secondary | ICD-10-CM | POA: Diagnosis not present

## 2023-09-27 DIAGNOSIS — R600 Localized edema: Secondary | ICD-10-CM

## 2023-09-27 DIAGNOSIS — M25641 Stiffness of right hand, not elsewhere classified: Secondary | ICD-10-CM | POA: Diagnosis not present

## 2023-09-27 NOTE — Therapy (Signed)
 OUTPATIENT OCCUPATIONAL THERAPY ORTHO TREATMENT  Patient Name: Courtney [name] MRN: [id number] DOB:Jun 05, 1990, 33 y.o., female Today's Date: 09/27/2023  PCP: Glendia PA REFERRING PROVIDER: Dr Kathlynn  END OF SESSION:  OT End of Session - 09/27/23 1110     Visit Number 5    Number of Visits 12    Date for OT Re-Evaluation 10/31/23    OT Start Time 1110    OT Stop Time 1149    OT Time Calculation (min) 39 min    Activity Tolerance Patient tolerated treatment well    Behavior During Therapy [facility] for tasks assessed/performed           Past Medical History:  Diagnosis Date   ADHD (attention deficit hyperactivity disorder)    Bipolar disorder (HCC)    Body piercing    Nose, frenulum, nipples   Chronic tonsillitis 08/2013   current strep, will finish antibiotic 09/04/2013; snores during sleep, mother denies apnea   Conductive hearing loss of left ear    Heart murmur    when I was born; outgrew it; I was a preemie   Hidradenitis    Hidradenitis 2017   Hx of substance abuse (HCC)    Migraine 12/19/2016   I've just had this one for 3 days (12/20/2016)   Pre-diabetes    Pulmonary emboli (HCC) 07/02/2021   BL   Septic shock (HCC) 07/02/2021   Past Surgical History:  Procedure Laterality Date   ADENOIDECTOMY N/A 10/15/2022   Procedure: ADENOIDECTOMY;  Surgeon: Jesus Oliphant, MD;  Location: [facility];  Service: ENT;  Laterality: N/A;   AXILLARY HIDRADENITIS EXCISION     AXILLARY LYMPH NODE BIOPSY Left 01/06/2019   CARPOMETACARPAL (CMC) FUSION OF THUMB Right 08/06/2023   Procedure: CARPOMETACARPAL [facility]) FUSION OF THUMB;  Surgeon: Kathlynn Sharper, MD;  Location: [facility];  Service: Orthopedics;  Laterality: Right;  Per Dr. Kathlynn, a carpometacarpal arthroplasty, but no bone removed   CYSTOSCOPY W/ URETERAL STENT PLACEMENT Right 05/28/2017   Procedure: CYSTOSCOPY WITH RETROGRADE PYELOGRAM/ RIGHT URETERAL STENT PLACEMENT;  Surgeon: Cam Morene ORN, MD;   Location: [facility];  Service: Urology;  Laterality: Right;   DORSAL COMPARTMENT RELEASE Right 08/06/2023   Procedure: RELEASE, FIRST DORSAL COMPARTMENT, HAND;  Surgeon: Kathlynn Sharper, MD;  Location: [facility];  Service: Orthopedics;  Laterality: Right;   HYDRADENITIS EXCISION Bilateral 04/03/2017   Procedure: EXCISION AND DRAINAGE BILATERAL  HIDRADENITIS AXILLA;  Surgeon: Curvin Deward MOULD, MD;  Location: [facility] OR;  Service: General;  Laterality: Bilateral;   INCISION AND DRAINAGE ABSCESS Right 07/20/2016   Procedure: INCISION AND DRAINAGE ABSCESS right axilla;  Surgeon: Vernetta Berg, MD;  Location: [facility];  Service: General;  Laterality: Right;   INTRAUTERINE DEVICE (IUD) INSERTION  11/2016   had the one in my left arm removed   IRRIGATION AND DEBRIDEMENT ABSCESS Right 12/21/2016   Procedure: IRRIGATION AND DEBRIDEMENT RIGHT AXILLARY HIDRADENITIS;  Surgeon: Teresa Lonni HERO, MD;  Location: [facility] OR;  Service: General;  Laterality: Right;   IRRIGATION AND DEBRIDEMENT ABSCESS Left 01/08/2017   Procedure: IRRIGATION AND DEBRIDEMENT AXILLARY ABSCESS;  Surgeon: Curvin Deward MOULD, MD;  Location: [facility] OR;  Service: General;  Laterality: Left;   NASAL SEPTOPLASTY W/ TURBINOPLASTY Bilateral 02/25/2019   Procedure: NASAL SEPTOPLASTY WITH TURBINATE REDUCTION;  Surgeon: Jesus Oliphant, MD;  Location: [facility];  Service: ENT;  Laterality: Bilateral;   TONSILLECTOMY Bilateral 09/14/2013   Procedure: BILATERAL TONSILLECTOMY;  Surgeon: Oliphant Jesus, MD;  Location: [facility];  Service: ENT;  Laterality: Bilateral;   TYMPANOPLASTY Bilateral    rebuilt eardrums   WISDOM TOOTH EXTRACTION     Patient Active Problem List   Diagnosis Date Noted   Polypharmacy 01/17/2023   Abnormal urinalysis 01/17/2023   History of substance abuse (HCC) 01/17/2023   Hospital discharge follow-up 01/17/2023   Acute cystitis 01/02/2023   Left ureteral stone 01/02/2023   CAP (community acquired  pneumonia) 01/02/2023   Polysubstance abuse (HCC) 01/02/2023   Methamphetamine use (HCC) 11/01/2022   Otitis externa of both ears 05/16/2022   Chronic rhinitis 02/15/2022   Shellfish allergy 02/15/2022   Pulmonary embolism (HCC) 07/19/2021   Hypotension 07/11/2021   Trichomonas vaginitis 07/05/2021   Acute metabolic encephalopathy 07/05/2021   Bilateral pulmonary embolism (HCC) 07/05/2021   Anxiety disorder 06/28/2021   Prediabetes 05/25/2021   Allergy to adhesive tape 05/25/2021   Hidradenitis suppurativa 05/23/2021   Insomnia 03/27/2021   Anemia 12/21/2020   Hypoglycemia 11/17/2020   Bipolar disorder, in full remission, most recent episode mixed (HCC) 10/07/2020   Bipolar disorder, in full remission, most recent episode depressed (HCC) 03/03/2020   Bipolar 2 disorder, major depressive episode (HCC) 08/04/2019   At risk for long QT syndrome 08/04/2019   Obesity (BMI 35.0-39.9 without comorbidity) 03/19/2019   Reactive airway disease 02/11/2019   Conductive hearing loss 03/13/2018   Axillary hidradenitis suppurativa 04/03/2017   Preventative health care 12/06/2016   Vitamin D  deficiency 10/18/2014   Lethargy 07/21/2014   Attention deficit hyperactivity disorder (ADHD), predominantly hyperactive type 02/11/2012    ONSET DATE: 08/06/23  REFERRING DIAG: R DeQuervain release and CMC arthroplasty  THERAPY DIAG:  Pain in joint of right hand  Edema of hand  Stiffness of right hand, not elsewhere classified  Muscle weakness (generalized)  Rationale for Evaluation and Treatment: Rehabilitation  SUBJECTIVE:   SUBJECTIVE STATEMENT: I am doing okay.  I brought in my putty from home for you to check.  No pain.  I can tell him strong to use my right hand more automatically. Pt accompanied by: self  PERTINENT HISTORY: Dr Kathlynn 08/30/23 Assessment/Plan:   Assessment & Plan Status post [facility] stabilization surgery  Post-surgical follow-up after right CMC stabilization with mini  tightrope and De Quervain's release (08/06/23) shows well-healing incisions and good thumb strength. No infection or complications are present. Encourage scar massage to soften scar tissue. Plan for occupational therapy to improve motion and strength.  Postoperative numbness in hand  Persistent numbness on the ulnar side of the right thumb is noted post-surgery, which is unusual for the affected area. Possible causes include nerve irritation or compression unrelated to the surgical site. Provide carpal tunnel exercises for five minutes twice daily. Encourage scar massage with lotion four to five times daily over all three incisions. Advise against heavy lifting or yard work, but encourage normal activities like feeding herself.  De Quervain's tenosynovitis  Following De Quervain's release, decreased sensation on the ulnar side of the thumb is observed, which is atypical for this procedure. Continue scar massage to aid recovery. Provide education on the difference between [facility] and carpal tunnel syndrome. Diagnoses and all orders for this visit:  De Quervain's tenosynovitis  Chronic or recurrent subluxation of carpometacarpal joint of right thumb   PRECAUTIONS: Splint wearing     WEIGHT BEARING RESTRICTIONS: NWB thru hand  PAIN:  Are you having pain?  No pain  FALLS: Has patient fallen in last 6 months? 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  NEXT DR APPT:   OBJECTIVE:  Note: Objective measures were completed at Evaluation unless otherwise noted.  HAND DOMINANCE: Right  ADLs:   FUNCTIONAL OUTCOME MEASURES: Next session  UPPER EXTREMITY ROM:     Active ROM Right eval Left eval  Shoulder flexion    Shoulder abduction    Shoulder adduction    Shoulder extension    Shoulder internal rotation    Shoulder external  rotation    Elbow flexion    Elbow extension    Wrist flexion 75 fist , 90 open hand   Wrist extension 70   Wrist ulnar deviation 30   Wrist radial deviation 20   Wrist pronation    Wrist supination    (Blank rows = not tested)  Active ROM Right eval Left eval R 09/27/23  Thumb MCP (0-60) 40 65   Thumb IP (0-80) 60 80   Thumb Radial abd/add (0-55) 42   45  Thumb Palmar abd/add (0-45) 50   50  Thumb Opposition to Small Finger Pain to 5th    Base of fifth pain-free  Index MCP (0-90)      Index PIP (0-100)      Index DIP (0-70)       Long MCP (0-90)       Long PIP (0-100)       Long DIP (0-70)       Ring MCP (0-90)       Ring PIP (0-100)       Ring DIP (0-70)       Little MCP (0-90)       Little PIP (0-100)       Little DIP (0-70)       (Blank rows = not tested) Discomfort with composite flexion at the dorsal second metacarpal.  At incision. CMC flexion on the right 30 and left 50. Opposition patient should decrease flexion at the IP and the MCP. Adducting into the carpal tunnel   HAND FUNCTION: 09/24/23 Grip strength: Right: 48 lbs; Left: 70 lbs, Lateral pinch: Right: 9 lbs, Left: 13 lbs, and 3 point pinch: Right: 11 lbs, Left: 15 lbs  COORDINATION:  At eval 4 weeks postop.  Was in a soft cast in our thumb spica increase stiffness decreased dexterity and strength  SENSATION: Patient continues to have some diminished sensation on the medial side of thumb Lateral side was numb but improved  EDEMA: Some increased edema over the thumb thenar eminence  COGNITION: Overall cognitive status: Within functional limits for tasks assessed  TREATMENT DATE: 09/26/23                                                                                                                           Patient arrived with [facility] neoprene on right hand.   Denies pain.  Doing okay with home exercises.  Used a 2 pound weight for the wrist exercises.    Grip strength same as last time.  Lateral  and 30 point pinch improved 1 pound. Active range of motion for thumb palmar radial abduction within normal limits.  As well as opposition to distal palmar crease Strength to opposition to fifth 5/5 Strength in wrist and forearm 5/5 except supination pronation 4+/5 As well as wrist extension and flexion 5/5   Soft tissue mobilization done to webspace to increase radial abduction.  To decrease strain on radial abduction of the thumb. Gentle clip done 2 x 45 seconds to decrease tightness increased radial and palmar abduction. Metacarpal spreads as well as carpal spreadds done prior to range of motion /strengthening.    Therapeutic Exercises:    Tendon glides pain-free focusing on motion 10 reps, 2 sets Active range of motion for palmar abduction and radial abduction of the thumb pain-free 10 reps, 2 sets Opposition to all digits focusing on oval and extension of the thumb in between 5-8 reps.  Rubber band for palmar radial abduction 12-15 reps 2 times a day 2 sets Upgrade to teal medium putty for for gripping and 3 point and lateral pinch.  12-15 reps 2 times a day pain-free 2 sets pain-free Patient tolerated well. Upgrade to 2 pound weight upination pronation ,radial ulnar deviation as well as atavistic wrist flexion extension 12-15 reps pain-free 2 times a day.;  2 sets Patient continue with 2 sets for another 2 days if pain-free can increase on Sunday to third set 2 times a day Patient needed verbal cueing as well as min of assistance for performing correctly as well as slowing down.       PATIENT EDUCATION: Education details: findings of eval and HEP  Person educated: Patient Education method: Explanation, Demonstration, Tactile cues, Verbal cues, and Handouts Education comprehension: verbalized understanding, returned demonstration, verbal cues required, and needs further education   GOALS: Goals reviewed with patient? Yes   LONG TERM GOALS: Target date: 8 wks  Patient to  be independent in home program to decrease pain to 0-1/10 at rest to be able to increase range of motion to within normal limits Baseline: Resting pain coming in 4/10 can increase to 7/10 with motion and use.  Opposition decreased.  Thumb IP MC flexion decreased thumb radial abduction decreased Goal status: INITIAL  2.  Right thumb radial and palmar abduction strength increased to 5/5 for patient be able to turn a doorknob, pick up half full glass as well as opening jar symptom-free Baseline: Patient pain with range of motion and functional use increased to 7/10 at the thumb.  Decrease thumb radial abduction.  Patient 4 weeks postop Goal status: INITIAL  3.  Right wrist active range of motion improved to within normal limits in all planes as well as strength 5/5 for patient to push and pull heavy door , turn doorknob doorknob Baseline: Wrist flexion composite decreased 75.  Radial ulnar deviation within functional limits with some discomfort, 4 weeks postop pain improved with use 7/10 Goal status: INITIAL  4.  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  Sleeping with thumb spica as well as wearing it during the day as needed because of pain.  Resting pain 4/10 can increase to 7/10. Goal status: INITIAL  5.  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  Opposition decreased as well as increased pain 4-7/10 in right thumb.  Patient is right-hand dominant. Goal status: INITIAL  ASSESSMENT:  CLINICAL IMPRESSION: Patient seen for occupational therapy  for right  thumb stabilization surgery at the [facility] using tight rope as well as DeQuervain release on 08/05/2020 5 by Dr. Kathlynn.  Patient to continue with contrast that helps at home to decrease stiffness and increased soreness or pain. Responds well to cues for exercises.  Patient aware CMC neoprene medium with test that causes some strain on thumb.  Awaked medium plus.  Patient's active range of motion increased greatly in thumb as well as wrist.  Last session initiated strengthening for wrist as well as thumb palmar radial abduction.  As well as grip and prehension strength.  Patient tolerating well in clinic today 2 sets pain-free.  Patient can increase over the weekend to a third set.  If pain-free.  Patient reports increased functional use of right dominant hand.  Patient limited in functional use of right dominant hand in ADLs and IADLs.  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  Patient can benefit from skilled OT services to decrease inflammation and pain and increased motion and strength.   PERFORMANCE DEFICITS: in functional skills including ADLs, IADLs, ROM, strength, pain, flexibility, decreased knowledge of use of DME, and UE functional use,   and psychosocial skills including environmental adaptation and routines and behaviors.   IMPAIRMENTS: are limiting patient from ADLs, IADLs, rest and sleep, play, leisure, and social participation.   COMORBIDITIES: has no other co-morbidities that affects occupational performance. Patient will benefit from skilled OT to address above impairments and improve overall function.  MODIFICATION OR ASSISTANCE TO COMPLETE EVALUATION: No modification of tasks or assist necessary to complete an evaluation.  OT OCCUPATIONAL PROFILE AND HISTORY: Problem focused assessment: Including review of records relating to presenting problem.  CLINICAL DECISION MAKING: LOW - limited treatment options, no task modification necessary  REHAB POTENTIAL: Good for  goals  EVALUATION COMPLEXITY: Low   PLAN:  OT FREQUENCY: 1-2x/week  OT DURATION: 10 weeks  PLANNED INTERVENTIONS: 97168 OT Re-evaluation, 97535 self care/ADL training, 02889 therapeutic exercise, 97530 therapeutic activity, 97112 neuromuscular re-education, 97140 manual therapy, 97018 paraffin, 02960 fluidotherapy, 97010 cryotherapy, 97034 contrast bath, 97033 iontophoresis, 97760 Orthotic Initial, [id number] Orthotic/Prosthetic subsequent, scar mobilization, passive range of motion, patient/family education, and DME and/or AE instructions  CONSULTED AND AGREED WITH PLAN OF CARE: Patient  Ancel Peters, OTR/L,CLT 09/27/2023, 1:58 PM

## 2023-09-30 ENCOUNTER — Encounter: Admit: 2023-09-30 | Discharge: 2023-10-01 | Payer: BLUE CROSS/BLUE SHIELD

## 2023-09-30 DIAGNOSIS — L732 Hidradenitis suppurativa: Principal | ICD-10-CM

## 2023-09-30 LAB — CBC W/ AUTO DIFF
BASOPHILS ABSOLUTE COUNT: 0.1 10*9/L (ref 0.0–0.1)
BASOPHILS RELATIVE PERCENT: 0.7 %
EOSINOPHILS ABSOLUTE COUNT: 0.3 10*9/L (ref 0.0–0.5)
EOSINOPHILS RELATIVE PERCENT: 2.7 %
HEMATOCRIT: 38.9 % (ref 34.0–44.0)
HEMOGLOBIN: 13.3 g/dL (ref 11.3–14.9)
LYMPHOCYTES ABSOLUTE COUNT: 3.8 10*9/L — ABNORMAL HIGH (ref 1.1–3.6)
LYMPHOCYTES RELATIVE PERCENT: 37.5 %
MEAN CORPUSCULAR HEMOGLOBIN CONC: 34.2 g/dL (ref 32.0–36.0)
MEAN CORPUSCULAR HEMOGLOBIN: 30.1 pg (ref 25.9–32.4)
MEAN CORPUSCULAR VOLUME: 88 fL (ref 77.6–95.7)
MEAN PLATELET VOLUME: 8.4 fL (ref 6.8–10.7)
MONOCYTES ABSOLUTE COUNT: 0.9 10*9/L — ABNORMAL HIGH (ref 0.3–0.8)
MONOCYTES RELATIVE PERCENT: 8.6 %
NEUTROPHILS ABSOLUTE COUNT: 5.1 10*9/L (ref 1.8–7.8)
NEUTROPHILS RELATIVE PERCENT: 50.5 %
PLATELET COUNT: 313 10*9/L (ref 150–450)
RED BLOOD CELL COUNT: 4.42 10*12/L (ref 3.95–5.13)
RED CELL DISTRIBUTION WIDTH: 13.2 % (ref 12.2–15.2)
WBC ADJUSTED: 10.1 10*9/L (ref 3.6–11.2)

## 2023-09-30 LAB — BUN: BLOOD UREA NITROGEN: 9 mg/dL (ref 9–23)

## 2023-09-30 LAB — CREATININE
CREATININE: 0.51 mg/dL — ABNORMAL LOW (ref 0.55–1.02)
EGFR CKD-EPI (2021) FEMALE: >90 mL/min/1.73m2 (ref >=60)

## 2023-09-30 LAB — C-REACTIVE PROTEIN: C-REACTIVE PROTEIN: <5 mg/L (ref <=10.0)

## 2023-09-30 LAB — SEDIMENTATION RATE: ERYTHROCYTE SEDIMENTATION RATE: 69 mm/h — ABNORMAL HIGH (ref 0–20)

## 2023-09-30 LAB — ALT: ALT (SGPT): 18 U/L (ref 10–49)

## 2023-09-30 LAB — AST: AST (SGOT): 18 U/L (ref <=34)

## 2023-09-30 MED ADMIN — inFLIXimab-axxq (AVSOLA) 10 mg/kg = 800 mg in sodium chloride (NS) 250 mL IVPB: 10 mg/kg | INTRAVENOUS | @ 14:00:00 | Stop: 2023-09-30

## 2023-09-30 MED ADMIN — acetaminophen (TYLENOL) tablet 650 mg: 650 mg | ORAL | @ 13:00:00 | Stop: 2023-09-30

## 2023-09-30 MED ADMIN — ondansetron (ZOFRAN) injection 8 mg: 8 mg | INTRAVENOUS | @ 14:00:00 | Stop: 2023-09-30

## 2023-09-30 MED ADMIN — diphenhydrAMINE (BENADRYL) capsule/tablet 25 mg: 25 mg | ORAL | @ 13:00:00 | Stop: 2023-09-30

## 2023-09-30 MED ADMIN — cetirizine (ZYRTEC) tablet 10 mg: 10 mg | ORAL | @ 13:00:00 | Stop: 2023-09-30

## 2023-09-30 NOTE — Unmapped (Signed)
 Pt presents to infusion center today for Q4 weeks dose of Infliximab -axxq (Avsola ) 10 mg/kg. Alert, oriented, NAD. Reports  no new medical issues/allergies or S/S of recent infection.   Pre-meds administered per MAR.   #22 PIV placed to LFA, labs collected per therapy plan orders.   @0941 : Infliximab -axxq (Avsola ) 10 mg/kg dose initiated at following rates:   10 ml/hr x 15 minutes  20 ml/hr x 15 minutes  40 ml/hr x 15 minutes  80 ml/hr x 15 minutes  150 ml/hr x 30 minutes  250 ml/hr x for the remainder of infusion.   @1144 : infusion completed without complications. Pt VSS, afebrile.   Line care provided with positive blood return; PIV flushed, discontinued per protocol.   Pt discharged from clinic in NAD, in stable condition.

## 2023-10-01 ENCOUNTER — Ambulatory Visit: Admitting: Occupational Therapy

## 2023-10-01 DIAGNOSIS — M25641 Stiffness of right hand, not elsewhere classified: Secondary | ICD-10-CM | POA: Diagnosis not present

## 2023-10-01 DIAGNOSIS — M25541 Pain in joints of right hand: Secondary | ICD-10-CM

## 2023-10-01 DIAGNOSIS — M6281 Muscle weakness (generalized): Secondary | ICD-10-CM | POA: Diagnosis not present

## 2023-10-01 DIAGNOSIS — R600 Localized edema: Secondary | ICD-10-CM

## 2023-10-01 LAB — QUANTIFERON TB GOLD PLUS
QUANTIFERON ANTIGEN 1 MINUS NIL: 0.02 [IU]/mL
QUANTIFERON ANTIGEN 2 MINUS NIL: -0.13 [IU]/mL
QUANTIFERON MITOGEN: 9.78 [IU]/mL
QUANTIFERON TB GOLD PLUS: NEGATIVE
QUANTIFERON TB NIL VALUE: 0.22 [IU]/mL

## 2023-10-01 LAB — TB AG2: TB AG2 VALUE: 0.09

## 2023-10-01 LAB — TB AG1: TB AG1 VALUE: 0.24

## 2023-10-01 LAB — TB NIL: TB NIL VALUE: 0.22

## 2023-10-01 LAB — TB MITOGEN: TB MITOGEN VALUE: 10

## 2023-10-01 NOTE — Therapy (Signed)
 OUTPATIENT OCCUPATIONAL THERAPY ORTHO TREATMENT  Patient Name: Courtney [name] MRN: [id number] DOB:1990/05/29, 33 y.o., female Today's Date: 10/01/2023  PCP: Glendia PA REFERRING PROVIDER: Dr Kathlynn  END OF SESSION:  OT End of Session - 10/01/23 1121     Visit Number 6    Number of Visits 12    Date for OT Re-Evaluation 10/31/23    OT Start Time 1121    Activity Tolerance Patient tolerated treatment well    Behavior During Therapy [facility] for tasks assessed/performed           Past Medical History:  Diagnosis Date   ADHD (attention deficit hyperactivity disorder)    Bipolar disorder (HCC)    Body piercing    Nose, frenulum, nipples   Chronic tonsillitis 08/2013   current strep, will finish antibiotic 09/04/2013; snores during sleep, mother denies apnea   Conductive hearing loss of left ear    Heart murmur    when I was born; outgrew it; I was a preemie   Hidradenitis    Hidradenitis 2017   Hx of substance abuse (HCC)    Migraine 12/19/2016   I've just had this one for 3 days (12/20/2016)   Pre-diabetes    Pulmonary emboli (HCC) 07/02/2021   BL   Septic shock (HCC) 07/02/2021   Past Surgical History:  Procedure Laterality Date   ADENOIDECTOMY N/A 10/15/2022   Procedure: ADENOIDECTOMY;  Surgeon: Jesus Oliphant, MD;  Location: [facility];  Service: ENT;  Laterality: N/A;   AXILLARY HIDRADENITIS EXCISION     AXILLARY LYMPH NODE BIOPSY Left 01/06/2019   CARPOMETACARPAL (CMC) FUSION OF THUMB Right 08/06/2023   Procedure: CARPOMETACARPAL [facility]) FUSION OF THUMB;  Surgeon: Kathlynn Sharper, MD;  Location: [facility];  Service: Orthopedics;  Laterality: Right;  Per Dr. Kathlynn, a carpometacarpal arthroplasty, but no bone removed   CYSTOSCOPY W/ URETERAL STENT PLACEMENT Right 05/28/2017   Procedure: CYSTOSCOPY WITH RETROGRADE PYELOGRAM/ RIGHT URETERAL STENT PLACEMENT;  Surgeon: Cam Morene ORN, MD;  Location: [facility];  Service: Urology;  Laterality: Right;   DORSAL  COMPARTMENT RELEASE Right 08/06/2023   Procedure: RELEASE, FIRST DORSAL COMPARTMENT, HAND;  Surgeon: Kathlynn Sharper, MD;  Location: [facility];  Service: Orthopedics;  Laterality: Right;   HYDRADENITIS EXCISION Bilateral 04/03/2017   Procedure: EXCISION AND DRAINAGE BILATERAL  HIDRADENITIS AXILLA;  Surgeon: Curvin Deward MOULD, MD;  Location: [facility];  Service: General;  Laterality: Bilateral;   INCISION AND DRAINAGE ABSCESS Right 07/20/2016   Procedure: INCISION AND DRAINAGE ABSCESS right axilla;  Surgeon: Vernetta Berg, MD;  Location: [facility];  Service: General;  Laterality: Right;   INTRAUTERINE DEVICE (IUD) INSERTION  11/2016   had the one in my left arm removed   IRRIGATION AND DEBRIDEMENT ABSCESS Right 12/21/2016   Procedure: IRRIGATION AND DEBRIDEMENT RIGHT AXILLARY HIDRADENITIS;  Surgeon: Teresa Lonni HERO, MD;  Location: [facility] OR;  Service: General;  Laterality: Right;   IRRIGATION AND DEBRIDEMENT ABSCESS Left 01/08/2017   Procedure: IRRIGATION AND DEBRIDEMENT AXILLARY ABSCESS;  Surgeon: Curvin Deward MOULD, MD;  Location: [facility] OR;  Service: General;  Laterality: Left;   NASAL SEPTOPLASTY W/ TURBINOPLASTY Bilateral 02/25/2019   Procedure: NASAL SEPTOPLASTY WITH TURBINATE REDUCTION;  Surgeon: Jesus Oliphant, MD;  Location: [facility];  Service: ENT;  Laterality: Bilateral;   TONSILLECTOMY Bilateral 09/14/2013   Procedure: BILATERAL TONSILLECTOMY;  Surgeon: Oliphant Jesus, MD;  Location: [facility];  Service: ENT;  Laterality: Bilateral;   TYMPANOPLASTY Bilateral    rebuilt  eardrums   WISDOM TOOTH EXTRACTION     Patient Active Problem List   Diagnosis Date Noted   Polypharmacy 01/17/2023   Abnormal urinalysis 01/17/2023   History of substance abuse (HCC) 01/17/2023   Hospital discharge follow-up 01/17/2023   Acute cystitis 01/02/2023   Left ureteral stone 01/02/2023   CAP (community acquired pneumonia) 01/02/2023   Polysubstance abuse (HCC) 01/02/2023    Methamphetamine use (HCC) 11/01/2022   Otitis externa of both ears 05/16/2022   Chronic rhinitis 02/15/2022   Shellfish allergy 02/15/2022   Pulmonary embolism (HCC) 07/19/2021   Hypotension 07/11/2021   Trichomonas vaginitis 07/05/2021   Acute metabolic encephalopathy 07/05/2021   Bilateral pulmonary embolism (HCC) 07/05/2021   Anxiety disorder 06/28/2021   Prediabetes 05/25/2021   Allergy to adhesive tape 05/25/2021   Hidradenitis suppurativa 05/23/2021   Insomnia 03/27/2021   Anemia 12/21/2020   Hypoglycemia 11/17/2020   Bipolar disorder, in full remission, most recent episode mixed (HCC) 10/07/2020   Bipolar disorder, in full remission, most recent episode depressed (HCC) 03/03/2020   Bipolar 2 disorder, major depressive episode (HCC) 08/04/2019   At risk for long QT syndrome 08/04/2019   Obesity (BMI 35.0-39.9 without comorbidity) 03/19/2019   Reactive airway disease 02/11/2019   Conductive hearing loss 03/13/2018   Axillary hidradenitis suppurativa 04/03/2017   Preventative health care 12/06/2016   Vitamin D  deficiency 10/18/2014   Lethargy 07/21/2014   Attention deficit hyperactivity disorder (ADHD), predominantly hyperactive type 02/11/2012    ONSET DATE: 08/06/23  REFERRING DIAG: R DeQuervain release and CMC arthroplasty  THERAPY DIAG:  Pain in joint of right hand  Edema of hand  Stiffness of right hand, not elsewhere classified  Muscle weakness (generalized)  Rationale for Evaluation and Treatment: Rehabilitation  SUBJECTIVE:   SUBJECTIVE STATEMENT: No pain since last time -but opening jars like twist top still hard, not picking up gallon yet - can do soda can - it is lighter and 1/2 gallon  Pt accompanied by: self  PERTINENT HISTORY: Dr Kathlynn 08/30/23 Assessment/Plan:   Assessment & Plan Status post [facility] stabilization surgery  Post-surgical follow-up after right CMC stabilization with mini tightrope and De Quervain's release (08/06/23) shows well-healing  incisions and good thumb strength. No infection or complications are present. Encourage scar massage to soften scar tissue. Plan for occupational therapy to improve motion and strength.  Postoperative numbness in hand  Persistent numbness on the ulnar side of the right thumb is noted post-surgery, which is unusual for the affected area. Possible causes include nerve irritation or compression unrelated to the surgical site. Provide carpal tunnel exercises for five minutes twice daily. Encourage scar massage with lotion four to five times daily over all three incisions. Advise against heavy lifting or yard work, but encourage normal activities like feeding herself.  De Quervain's tenosynovitis  Following De Quervain's release, decreased sensation on the ulnar side of the thumb is observed, which is atypical for this procedure. Continue scar massage to aid recovery. Provide education on the difference between [facility] and carpal tunnel syndrome. Diagnoses and all orders for this visit:  De Quervain's tenosynovitis  Chronic or recurrent subluxation of carpometacarpal joint of right thumb   PRECAUTIONS: Splint wearing     WEIGHT BEARING RESTRICTIONS: NWB thru hand  PAIN:  Are you having pain?  No pain  FALLS: Has patient fallen in last 6 months? 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  NEXT DR APPT:   OBJECTIVE:  Note: Objective measures were completed at Evaluation unless otherwise noted.  HAND DOMINANCE: Right  ADLs:   FUNCTIONAL OUTCOME MEASURES: Next session  UPPER EXTREMITY ROM:     Active ROM Right eval Left eval  Shoulder flexion    Shoulder abduction    Shoulder adduction    Shoulder extension    Shoulder internal rotation    Shoulder external rotation    Elbow flexion    Elbow extension    Wrist flexion  75 fist , 90 open hand   Wrist extension 70   Wrist ulnar deviation 30   Wrist radial deviation 20   Wrist pronation    Wrist supination    (Blank rows = not tested)  Active ROM Right eval Left eval R 09/27/23  Thumb MCP (0-60) 40 65   Thumb IP (0-80) 60 80   Thumb Radial abd/add (0-55) 42   45  Thumb Palmar abd/add (0-45) 50   50  Thumb Opposition to Small Finger Pain to 5th    Base of fifth pain-free  Index MCP (0-90)      Index PIP (0-100)      Index DIP (0-70)       Long MCP (0-90)       Long PIP (0-100)       Long DIP (0-70)       Ring MCP (0-90)       Ring PIP (0-100)       Ring DIP (0-70)       Little MCP (0-90)       Little PIP (0-100)       Little DIP (0-70)       (Blank rows = not tested) Discomfort with composite flexion at the dorsal second metacarpal.  At incision. CMC flexion on the right 30 and left 50. Opposition patient should decrease flexion at the IP and the MCP. Adducting into the carpal tunnel   HAND FUNCTION: 09/24/23 Grip strength: Right: 48 lbs; Left: 70 lbs, Lateral pinch: Right: 9 lbs, Left: 13 lbs, and 3 point pinch: Right: 11 lbs, Left: 15 lbs 10/01/23 Grip strength: Right: 55 lbs; Left: 70 lbs, Lateral pinch: Right:10 lbs, Left: 13 lbs, and 3 point pinch: Right: 11 lbs, Left: 15 lbs  COORDINATION:  At eval 4 weeks postop.  Was in a soft cast in our thumb spica increase stiffness decreased dexterity and strength  SENSATION: Patient continues to have some diminished sensation on the medial side of thumb Lateral side was numb but improved  EDEMA: Some increased edema over the thumb thenar eminence  COGNITION: Overall cognitive status: Within functional limits for tasks assessed  TREATMENT DATE: 10/01/23                                                                                                                           Patient arrived with [facility] neoprene  on right hand wearing about 50% with activities Denies pain.   Doing okay with home  exercises.  Used a 2 pound weight for the wrist exercises.    Grip and prehension strength continue to improve. Active range of motion for thumb palmar radial abduction within normal limits.  As well as opposition to distal palmar crease Strength to opposition to fifth 5/5 Strength in wrist and forearm 5/5 except supination pronation 4+/5 As well as wrist extension and flexion 5/5 Thumb palmar radial abduction 4/5  Fluidotherapy done for 8 minutes to decrease stiffness increase thumb range of motion as well as wrist prior to review of home exercises and soft tissue.  Soft tissue mobilization done to webspace to increase radial abduction.  To decrease strain on radial abduction of the thumb. Gentle clip done 2 x 45 seconds to decrease tightness increased radial and palmar abduction. Metacarpal spreads as well as carpal spreadds done prior to range of motion /strengthening.    Therapeutic Exercises:    Tendon glides pain-free focusing on motion 10 reps, 2 sets Active range of motion for palmar abduction and radial abduction of the thumb pain-free 10 reps, 2 sets Opposition to all digits focusing on oval and extension of the thumb in between 5-8 reps.  Rubber band for palmar radial abduction 12-15 reps 2 times a day 3 sets Upgrade to green  medium  firm putty for for gripping and 3 point and lateral pinch.  12-15 reps 2 times a day pain-free 1 set pain-free needed moderate verbal cueing to keep pain-free Patient tolerated well. Continue to 2 pound weight supination pronation ,radial ulnar deviation as well as wrist flexion extension 12-15 reps pain-free 2 times a day.;  3 sets  At this date isometric strengthening for thumb in all directions for stability pain-free 12 reps 2 times a day Also turning and twisting lacrosse ball and putty maintaining palmar abduction pain-free clockwise and onto clockwise pain-free add to home exercises   Reinforced with patient not to overdo thumb exercises  by attention to her reps and sets.   PATIENT EDUCATION: Education details: findings of eval and HEP  Person educated: Patient Education method: Explanation, Demonstration, Tactile cues, Verbal cues, and Handouts Education comprehension: verbalized understanding, returned demonstration, verbal cues required, and needs further education   GOALS: Goals reviewed with patient? Yes   LONG TERM GOALS: Target date: 8 wks  Patient to be independent in home program to decrease pain to 0-1/10 at rest to be able to increase range of motion to within normal limits Baseline: Resting pain coming in 4/10 can increase to 7/10 with motion and use.  Opposition decreased.  Thumb IP MC flexion decreased thumb radial abduction decreased Goal status: INITIAL  2.  Right thumb radial and palmar abduction strength increased to 5/5 for patient be able to turn a doorknob, pick up half full glass as well as opening jar symptom-free Baseline: Patient pain with range of motion and functional use increased to 7/10 at the thumb.  Decrease thumb radial abduction.  Patient 4 weeks postop Goal status: INITIAL  3.  Right wrist active range of motion improved to within normal limits in all planes as well as strength 5/5 for patient to push and pull heavy door , turn doorknob doorknob Baseline: Wrist flexion composite decreased 75.  Radial ulnar deviation within functional limits with some discomfort, 4 weeks postop pain improved with use 7/10 Goal status: INITIAL  4.  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  Sleeping with thumb spica as well as wearing it during the day as needed because of pain.  Resting pain 4/10 can increase to 7/10. Goal status: INITIAL  5.  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  Opposition decreased as well as increased pain 4-7/10 in right thumb.  Patient is right-hand dominant. Goal status: INITIAL  ASSESSMENT:  CLINICAL IMPRESSION: Patient seen for occupational therapy  for right thumb stabilization surgery at the [facility] using tight rope as well as DeQuervain release on 08/06/2023- Responds well to cues for exercises.  Patient continues to use her CMC neoprene as needed with activities that causes pain in the thumb.  Less than 50% of the time.  Patient's active range of motion increased greatly in thumb as well as wrist.  Last week initiated strengthening for wrist as well as thumb palmar radial abduction.  As well as grip and prehension strength.  Patient tolerated really well was able to upgrade to 3 sets.  Today increase patient to medium firm putty as well as some isometric strengthening for the thumb.   Patient reports increased functional use of right dominant hand.  Patient limited in functional use of right dominant hand in ADLs and IADLs.  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  Patient can benefit from skilled OT services to decrease inflammation and pain and increased motion and strength.   PERFORMANCE DEFICITS: in functional skills including ADLs, IADLs, ROM, strength, pain, flexibility, decreased knowledge of use of DME, and UE functional use,   and psychosocial skills including environmental adaptation and routines and behaviors.   IMPAIRMENTS: are limiting patient from ADLs, IADLs, rest and sleep, play, leisure, and social participation.   COMORBIDITIES: has no other co-morbidities that affects occupational performance. Patient will benefit from skilled OT to address above impairments and improve overall  function.  MODIFICATION OR ASSISTANCE TO COMPLETE EVALUATION: No modification of tasks or assist necessary to complete an evaluation.  OT OCCUPATIONAL PROFILE AND HISTORY: Problem focused assessment: Including review of records relating to presenting problem.  CLINICAL DECISION MAKING: LOW - limited treatment options, no task modification necessary  REHAB POTENTIAL: Good for goals  EVALUATION COMPLEXITY: Low   PLAN:  OT FREQUENCY: 1-2x/week  OT DURATION: 10 weeks  PLANNED INTERVENTIONS: 97168 OT Re-evaluation, 97535 self care/ADL training, 02889 therapeutic exercise, 97530 therapeutic activity, 97112 neuromuscular re-education, 97140 manual therapy, 97018 paraffin, 02960 fluidotherapy, 97010 cryotherapy, 97034 contrast bath, 97033 iontophoresis, 97760 Orthotic Initial, [id number] Orthotic/Prosthetic subsequent, scar mobilization, passive range of motion, patient/family education, and DME and/or AE instructions  CONSULTED AND AGREED WITH PLAN OF CARE: Patient  Ancel Peters, OTR/L,CLT 10/01/2023, 11:22 AM

## 2023-10-01 NOTE — Unmapped (Signed)
 10/01/2023 - Patient originally requested delivery for 10/05/2023. Delivery is not possible on this date due to Saturday delivery unavailable. I have reached out to the patient and confirmed that delivery on 10/04/2023 is ok.    [facility] Refill Coordination Note    Melissa Pearson, Melissa Pearson: 09-19-1990  Phone: [phone] (home) [phone] (work)      All above HIPAA information was verified with patient.         09/27/2023     1:23 PM   Specialty Rx Medication Refill Questionnaire   Which Medications would you like refilled and shipped? [email or web address]    Please list all current allergies: Morphine   Have you missed any doses in the last 30 days? No   Have you had any changes to your medication(s) since your last refill? No   How much of each medication do you have remaining at home? (eg. number of tablets, injections, etc.) 0   If receiving an injectable medication, next injection date is 10/06/2023   Have you experienced any side effects in the last 30 days? No   Please enter the full address (street address, city, state, zip code) where you would like your medication(s) to be delivered to. [address]   Please specify on which day you would like your medication(s) to arrive. Note: if you need your medication(s) within 3 days, please call the pharmacy to schedule your order at [phone]  10/05/2023   Has your insurance changed since your last refill? No   Would you like a pharmacist to call you to discuss your medication(s)? No   Do you require a signature for your package? (Note: if we are billing Medicare Part B or your order contains a controlled substance, we will require a signature) No   I have been provided my out of pocket cost for my medication and approve the pharmacy to charge the amount to my credit card on file. Yes         Completed refill call assessment today to schedule patient's medication shipment from the [facility] [phone]). All relevant notes have been reviewed.       Confirmed patient received a Conservation officer, historic buildings and a Surveyor, mining with first shipment. The patient will receive a drug information handout for each medication shipped and additional FDA Medication Guides as required.         REFERRAL TO PHARMACIST     Referral to the pharmacist: Not needed      [facility]     Shipping address confirmed in Epic.     Delivery Scheduled: Yes, Expected medication delivery date: 10/04/2023.     Medication will be delivered via UPS to the prescription address in Epic WAM.    Melissa Pearson   [facility] Specialty Technician

## 2023-10-03 ENCOUNTER — Ambulatory Visit: Admitting: Occupational Therapy

## 2023-10-03 DIAGNOSIS — M6281 Muscle weakness (generalized): Secondary | ICD-10-CM

## 2023-10-03 DIAGNOSIS — M25641 Stiffness of right hand, not elsewhere classified: Secondary | ICD-10-CM | POA: Diagnosis not present

## 2023-10-03 DIAGNOSIS — R600 Localized edema: Secondary | ICD-10-CM | POA: Diagnosis not present

## 2023-10-03 DIAGNOSIS — M25541 Pain in joints of right hand: Secondary | ICD-10-CM

## 2023-10-03 MED FILL — BIMZELX 320 MG/2 ML SUBCUTANEOUS SYRINGE: SUBCUTANEOUS | 28 days supply | Qty: 2 | Fill #5

## 2023-10-03 NOTE — Therapy (Signed)
 OUTPATIENT OCCUPATIONAL THERAPY ORTHO TREATMENT  Patient Name: Courtney [name] DOBBIN MRN: [id number] DOB:20-Jul-1990, 33 y.o., female Today's Date: 10/03/2023  PCP: Glendia PA REFERRING PROVIDER: Dr Kathlynn  END OF SESSION:  OT End of Session - 10/03/23 1205     Visit Number 7    Number of Visits 12    Date for OT Re-Evaluation 10/31/23    OT Start Time 1205    OT Stop Time 1244    OT Time Calculation (min) 39 min    Activity Tolerance Patient tolerated treatment well    Behavior During Therapy [facility] for tasks assessed/performed           Past Medical History:  Diagnosis Date   ADHD (attention deficit hyperactivity disorder)    Bipolar disorder (HCC)    Body piercing    Nose, frenulum, nipples   Chronic tonsillitis 08/2013   current strep, will finish antibiotic 09/04/2013; snores during sleep, mother denies apnea   Conductive hearing loss of left ear    Heart murmur    when I was born; outgrew it; I was a preemie   Hidradenitis    Hidradenitis 2017   Hx of substance abuse (HCC)    Migraine 12/19/2016   I've just had this one for 3 days (12/20/2016)   Pre-diabetes    Pulmonary emboli (HCC) 07/02/2021   BL   Septic shock (HCC) 07/02/2021   Past Surgical History:  Procedure Laterality Date   ADENOIDECTOMY N/A 10/15/2022   Procedure: ADENOIDECTOMY;  Surgeon: Jesus Oliphant, MD;  Location: [facility];  Service: ENT;  Laterality: N/A;   AXILLARY HIDRADENITIS EXCISION     AXILLARY LYMPH NODE BIOPSY Left 01/06/2019   CARPOMETACARPAL (CMC) FUSION OF THUMB Right 08/06/2023   Procedure: CARPOMETACARPAL [facility]) FUSION OF THUMB;  Surgeon: Kathlynn Sharper, MD;  Location: [facility];  Service: Orthopedics;  Laterality: Right;  Per Dr. Kathlynn, a carpometacarpal arthroplasty, but no bone removed   CYSTOSCOPY W/ URETERAL STENT PLACEMENT Right 05/28/2017   Procedure: CYSTOSCOPY WITH RETROGRADE PYELOGRAM/ RIGHT URETERAL STENT PLACEMENT;  Surgeon: Cam Morene ORN, MD;   Location: [facility];  Service: Urology;  Laterality: Right;   DORSAL COMPARTMENT RELEASE Right 08/06/2023   Procedure: RELEASE, FIRST DORSAL COMPARTMENT, HAND;  Surgeon: Kathlynn Sharper, MD;  Location: [facility];  Service: Orthopedics;  Laterality: Right;   HYDRADENITIS EXCISION Bilateral 04/03/2017   Procedure: EXCISION AND DRAINAGE BILATERAL  HIDRADENITIS AXILLA;  Surgeon: Curvin Deward MOULD, MD;  Location: [facility] OR;  Service: General;  Laterality: Bilateral;   INCISION AND DRAINAGE ABSCESS Right 07/20/2016   Procedure: INCISION AND DRAINAGE ABSCESS right axilla;  Surgeon: Vernetta Berg, MD;  Location: [facility] OR;  Service: General;  Laterality: Right;   INTRAUTERINE DEVICE (IUD) INSERTION  11/2016   had the one in my left arm removed   IRRIGATION AND DEBRIDEMENT ABSCESS Right 12/21/2016   Procedure: IRRIGATION AND DEBRIDEMENT RIGHT AXILLARY HIDRADENITIS;  Surgeon: Teresa Lonni HERO, MD;  Location: [facility] OR;  Service: General;  Laterality: Right;   IRRIGATION AND DEBRIDEMENT ABSCESS Left 01/08/2017   Procedure: IRRIGATION AND DEBRIDEMENT AXILLARY ABSCESS;  Surgeon: Curvin Deward MOULD, MD;  Location: [facility] OR;  Service: General;  Laterality: Left;   NASAL SEPTOPLASTY W/ TURBINOPLASTY Bilateral 02/25/2019   Procedure: NASAL SEPTOPLASTY WITH TURBINATE REDUCTION;  Surgeon: Jesus Oliphant, MD;  Location: [facility];  Service: ENT;  Laterality: Bilateral;   TONSILLECTOMY Bilateral 09/14/2013   Procedure: BILATERAL TONSILLECTOMY;  Surgeon: Oliphant Jesus, MD;  Location: [facility];  Service: ENT;  Laterality: Bilateral;   TYMPANOPLASTY Bilateral    rebuilt eardrums   WISDOM TOOTH EXTRACTION     Patient Active Problem List   Diagnosis Date Noted   Polypharmacy 01/17/2023   Abnormal urinalysis 01/17/2023   History of substance abuse (HCC) 01/17/2023   Hospital discharge follow-up 01/17/2023   Acute cystitis 01/02/2023   Left ureteral stone 01/02/2023   CAP (community acquired  pneumonia) 01/02/2023   Polysubstance abuse (HCC) 01/02/2023   Methamphetamine use (HCC) 11/01/2022   Otitis externa of both ears 05/16/2022   Chronic rhinitis 02/15/2022   Shellfish allergy 02/15/2022   Pulmonary embolism (HCC) 07/19/2021   Hypotension 07/11/2021   Trichomonas vaginitis 07/05/2021   Acute metabolic encephalopathy 07/05/2021   Bilateral pulmonary embolism (HCC) 07/05/2021   Anxiety disorder 06/28/2021   Prediabetes 05/25/2021   Allergy to adhesive tape 05/25/2021   Hidradenitis suppurativa 05/23/2021   Insomnia 03/27/2021   Anemia 12/21/2020   Hypoglycemia 11/17/2020   Bipolar disorder, in full remission, most recent episode mixed (HCC) 10/07/2020   Bipolar disorder, in full remission, most recent episode depressed (HCC) 03/03/2020   Bipolar 2 disorder, major depressive episode (HCC) 08/04/2019   At risk for long QT syndrome 08/04/2019   Obesity (BMI 35.0-39.9 without comorbidity) 03/19/2019   Reactive airway disease 02/11/2019   Conductive hearing loss 03/13/2018   Axillary hidradenitis suppurativa 04/03/2017   Preventative health care 12/06/2016   Vitamin D  deficiency 10/18/2014   Lethargy 07/21/2014   Attention deficit hyperactivity disorder (ADHD), predominantly hyperactive type 02/11/2012    ONSET DATE: 08/06/23  REFERRING DIAG: R DeQuervain release and CMC arthroplasty  THERAPY DIAG:  Pain in joint of right hand  Edema of hand  Stiffness of right hand, not elsewhere classified  Muscle weakness (generalized)  Rationale for Evaluation and Treatment: Rehabilitation  SUBJECTIVE:   SUBJECTIVE STATEMENT: No pain since last time -but opening jars like twist top still hard, not picking up gallon yet - can do soda can - it is lighter and 1/2 gallon  Pt accompanied by: self  PERTINENT HISTORY: Dr Kathlynn 08/30/23 Assessment/Plan:   Assessment & Plan Status post [facility] stabilization surgery  Post-surgical follow-up after right CMC stabilization with mini  tightrope and De Quervain's release (08/06/23) shows well-healing incisions and good thumb strength. No infection or complications are present. Encourage scar massage to soften scar tissue. Plan for occupational therapy to improve motion and strength.  Postoperative numbness in hand  Persistent numbness on the ulnar side of the right thumb is noted post-surgery, which is unusual for the affected area. Possible causes include nerve irritation or compression unrelated to the surgical site. Provide carpal tunnel exercises for five minutes twice daily. Encourage scar massage with lotion four to five times daily over all three incisions. Advise against heavy lifting or yard work, but encourage normal activities like feeding herself.  De Quervain's tenosynovitis  Following De Quervain's release, decreased sensation on the ulnar side of the thumb is observed, which is atypical for this procedure. Continue scar massage to aid recovery. Provide education on the difference between [facility] and carpal tunnel syndrome. Diagnoses and all orders for this visit:  De Quervain's tenosynovitis  Chronic or recurrent subluxation of carpometacarpal joint of right thumb   PRECAUTIONS: Splint wearing     WEIGHT BEARING RESTRICTIONS: NWB thru hand  PAIN:  Are you having pain?  No pain  FALLS: Has patient fallen in last 6 months? 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  NEXT DR APPT:   OBJECTIVE:  Note: Objective measures were completed at Evaluation unless otherwise noted.  HAND DOMINANCE: Right  ADLs:   FUNCTIONAL OUTCOME MEASURES: Next session  UPPER EXTREMITY ROM:     Active ROM Right eval Left eval  Shoulder flexion    Shoulder abduction    Shoulder adduction    Shoulder extension    Shoulder internal rotation    Shoulder external  rotation    Elbow flexion    Elbow extension    Wrist flexion 75 fist , 90 open hand   Wrist extension 70   Wrist ulnar deviation 30   Wrist radial deviation 20   Wrist pronation    Wrist supination    (Blank rows = not tested)  Active ROM Right eval Left eval R 09/27/23  Thumb MCP (0-60) 40 65   Thumb IP (0-80) 60 80   Thumb Radial abd/add (0-55) 42   45  Thumb Palmar abd/add (0-45) 50   50  Thumb Opposition to Small Finger Pain to 5th    Base of fifth pain-free  Index MCP (0-90)      Index PIP (0-100)      Index DIP (0-70)       Long MCP (0-90)       Long PIP (0-100)       Long DIP (0-70)       Ring MCP (0-90)       Ring PIP (0-100)       Ring DIP (0-70)       Little MCP (0-90)       Little PIP (0-100)       Little DIP (0-70)       (Blank rows = not tested) Discomfort with composite flexion at the dorsal second metacarpal.  At incision. CMC flexion on the right 30 and left 50. Opposition patient should decrease flexion at the IP and the MCP. Adducting into the carpal tunnel   HAND FUNCTION: 09/24/23 Grip strength: Right: 48 lbs; Left: 70 lbs, Lateral pinch: Right: 9 lbs, Left: 13 lbs, and 3 point pinch: Right: 11 lbs, Left: 15 lbs 10/01/23 Grip strength: Right: 55 lbs; Left: 70 lbs, Lateral pinch: Right:10 lbs, Left: 13 lbs, and 3 point pinch: Right: 11 lbs, Left: 15 lbs 10/01/23 Grip strength: Right: 64 lbs; Left: 70 lbs, Lateral pinch: Right:13 lbs, Left: 13 lbs, and 3 point pinch: Right: 14 lbs, Left: 15 lbs  COORDINATION:  At eval 4 weeks postop.  Was in a soft cast in our thumb spica increase stiffness decreased dexterity and strength  SENSATION: Patient continues to have some diminished sensation on the medial side of thumb Lateral side was numb but improved  EDEMA: Some increased edema over the thumb thenar eminence  COGNITION: Overall cognitive status: Within functional limits for tasks assessed  TREATMENT DATE: 10/03/23  Patient arrives this date with no complaints of pain. Patient was able to state to push and pull heavy door, turn doorknob, push up from a chair.  Pain-free. Patient able to carry 4 to 6 pounds and simulate pouring and drink.  Some discomfort and pain with 8 pounds carrying and lifting. Patient reports some symptoms of medial epicondylitis that she had in the past.  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 Assessed Grip and prehension strength -patient continues to improve every session. Wrist extension limited by 5 to 10 degrees this date Reviewed and added with patient table slides with light weightbearing 20 reps 2-3 times a day prior to wrist exercises Strengthen wrist in all planes 5/5 this date except supination 4+/5 Thumb palmar radial abduction 4+/5. Opposition within normal limits 5 -/5 opposition strength     Therapeutic Exercises:    Patient to continue with tendon glides as well as thumb palmar radial abduction opposition for warm up AROM 2 times a day   Followed by Rubber band for palmar radial abduction 12-15 reps 2 times a day 3 sets Continue with green medium firm putty for for gripping and 3 point and lateral pinch.  12-15 reps 2 times a day pain-free 1 set pain patient can over the next 5 days to 7 days increase to a 2nd and 3rd set pain-free Patient tolerated well. Upgrade patient to 3 pounds for scapular retraction or rowing 1215 reps Horizontal shoulder abduction with scapular retraction on the wall weightbearing modified plank 12 reps Bicep curls 12 reps Tricep 12 reps 1 time a day pain-free Patient can increase to 2nd and 3rd set over the next 5 to 7 days Reinforced with patient not to overdo thumb exercises by attention to her reps and sets.   PATIENT EDUCATION: Education details: findings of eval and HEP  Person educated: Patient Education method: Explanation,  Demonstration, Tactile cues, Verbal cues, and Handouts Education comprehension: verbalized understanding, returned demonstration, verbal cues required, and needs further education   GOALS: Goals reviewed with patient? Yes   LONG TERM GOALS: Target date: 8 wks  Patient to be independent in home program to decrease pain to 0-1/10 at rest to be able to increase range of motion to within normal limits Baseline: Resting pain coming in 4/10 can increase to 7/10 with motion and use.  Opposition decreased.  Thumb IP MC flexion decreased thumb radial abduction decreased Goal status: INITIAL  2.  Right thumb radial and palmar abduction strength increased to 5/5 for patient be able to turn a doorknob, pick up half full glass as well as opening jar symptom-free Baseline: Patient pain with range of motion and functional use increased to 7/10 at the thumb.  Decrease thumb radial abduction.  Patient 4 weeks postop Goal status: INITIAL  3.  Right wrist active range of motion improved to within normal limits in all planes as well as strength 5/5 for patient to push and pull heavy door , turn doorknob doorknob Baseline: Wrist flexion composite decreased 75.  Radial ulnar deviation within functional limits with some discomfort, 4 weeks postop pain improved with use 7/10 Goal status: INITIAL  4.  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  Sleeping with thumb spica as well as wearing it during the day as needed because of pain.  Resting pain 4/10 can increase  to 7/10. Goal status: INITIAL  5.  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  Opposition decreased as well as increased pain 4-7/10 in right thumb.  Patient is right-hand dominant. Goal status: INITIAL  ASSESSMENT:  CLINICAL IMPRESSION: Patient seen for occupational therapy  for right thumb stabilization surgery at the [facility] using tight rope as well as DeQuervain release on 08/06/2023- Responds well to cues for exercises.   Patient's active range of motion increased greatly in thumb as well as wrist.  Patient making great progress in range of motion and strength in right thumb and wrist in all planes.  Grip and prehension strength improve each session.  Patient doing really well with pain control.  Able to initiate more strength and conditioning for shoulders, elbows and wrist this date.  Patient can increase reps and sets at home as well as putty for gripping and prehension.  Pain-free.  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  Patient limited in functional use of right dominant hand in ADLs and IADLs.  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  Patient can benefit from skilled OT services to decrease inflammation and pain and increased motion and strength.   PERFORMANCE DEFICITS: in functional skills including ADLs, IADLs, ROM, strength, pain, flexibility, decreased knowledge of use of DME, and UE functional use,   and psychosocial skills including environmental adaptation and routines and behaviors.   IMPAIRMENTS: are limiting patient from ADLs, IADLs, rest and sleep, play, leisure, and social participation.   COMORBIDITIES: has no other co-morbidities that affects occupational performance. Patient will benefit from skilled OT to address above impairments and improve overall function.  MODIFICATION OR ASSISTANCE TO COMPLETE EVALUATION: No modification of tasks or assist necessary to complete  an evaluation.  OT OCCUPATIONAL PROFILE AND HISTORY: Problem focused assessment: Including review of records relating to presenting problem.  CLINICAL DECISION MAKING: LOW - limited treatment options, no task modification necessary  REHAB POTENTIAL: Good for goals  EVALUATION COMPLEXITY: Low   PLAN:  OT FREQUENCY: 1-2x/week  OT DURATION: 10 weeks  PLANNED INTERVENTIONS: 97168 OT Re-evaluation, 97535 self care/ADL training, 02889 therapeutic exercise, 97530 therapeutic activity, 97112 neuromuscular re-education, 97140 manual therapy, 97018 paraffin, 02960 fluidotherapy, 97010 cryotherapy, 97034 contrast bath, 97033 iontophoresis, 97760 Orthotic Initial, [id number] Orthotic/Prosthetic subsequent, scar mobilization, passive range of motion, patient/family education, and DME and/or AE instructions  CONSULTED AND AGREED WITH PLAN OF CARE: Patient  Ancel Peters, OTR/L,CLT 10/03/2023, 12:49 PM

## 2023-10-10 ENCOUNTER — Ambulatory Visit: Admitting: Occupational Therapy

## 2023-10-12 DIAGNOSIS — L732 Hidradenitis suppurativa: Principal | ICD-10-CM

## 2023-10-29 ENCOUNTER — Encounter: Admit: 2023-10-29 | Discharge: 2023-10-30 | Payer: BLUE CROSS/BLUE SHIELD

## 2023-10-29 DIAGNOSIS — L732 Hidradenitis suppurativa: Secondary | ICD-10-CM | POA: Diagnosis not present

## 2023-10-29 MED ADMIN — diphenhydrAMINE (BENADRYL) capsule/tablet 25 mg: 25 mg | ORAL | @ 13:00:00 | Stop: 2023-10-29

## 2023-10-29 MED ADMIN — inFLIXimab-axxq (AVSOLA) 10 mg/kg = 800 mg in sodium chloride (NS) 250 mL IVPB: 10 mg/kg | INTRAVENOUS | @ 13:00:00 | Stop: 2023-10-29

## 2023-10-29 MED ADMIN — ondansetron (ZOFRAN) injection 8 mg: 8 mg | INTRAVENOUS | @ 13:00:00 | Stop: 2023-10-29

## 2023-10-29 MED ADMIN — cetirizine (ZYRTEC) tablet 10 mg: 10 mg | ORAL | @ 13:00:00 | Stop: 2023-10-29

## 2023-10-29 MED ADMIN — acetaminophen (TYLENOL) tablet 650 mg: 650 mg | ORAL | @ 13:00:00 | Stop: 2023-10-29

## 2023-10-29 NOTE — Unmapped (Signed)
 Patient presents for standard Avsola  (infliximab -axxq) infusion.  In no acute distress. Vitals stable.  Reports no new medical issues or S/S of infection.  PIV placed.  See MAR for premeds.    9073 Avsola  (infliximab -axxq) 800 mg infusing as follows:     10ml/hr x 15 min  74ml/hr x 15 min  43ml/hr x 15 min  79ml/hr x 15 min  174ml/hr x 30 min  25ml/hr for the remainder of the infusion.    1130 Avsola  (infliximab -axxq) infusion complete.  PIV flushed with NS.  Vitals stable.  Patient without any s/s of adverse reaction.    IV d/c'd.  Patient discharged from Infusion Center.

## 2023-10-30 NOTE — Unmapped (Signed)
 [facility] Refill Coordination Note    Specialty Medication(s) to be Shipped:   Inflammatory Disorders: Bimzelx     Other medication(s) to be shipped: No additional medications requested for fill at this time    Specialty Medications not needed at this time: N/A     Melissa Pearson, DOB: Jun 30, 1990  Phone: [phone] (home) [phone] (work)      All above HIPAA information was verified with patient.     Was a Nurse, learning disability used for this call? No    Completed refill call assessment today to schedule patient's medication shipment from the [facility]  [phone]).  All relevant notes have been reviewed.     Specialty medication(s) and dose(s) confirmed: Regimen is correct and unchanged.   Changes to medications: Melissa Pearson reports no changes at this time.  Changes to insurance: No  New side effects reported not previously addressed with a pharmacist or physician: None reported  Questions for the pharmacist: No    Confirmed patient received a Conservation officer, historic buildings and a Surveyor, mining with first shipment. The patient will receive a drug information handout for each medication shipped and additional FDA Medication Guides as required.       DISEASE/MEDICATION-SPECIFIC INFORMATION        For patients on injectable medications: Next injection is scheduled for 11/01/2023.    SPECIALTY MEDICATION ADHERENCE     Medication Adherence    Patient reported X missed doses in the last month: 0  Specialty Medication: bimekizumab -bkzx (BIMZELX ) 320 mg/2 mL subcutaneous injection  Patient is on additional specialty medications: No              Were doses missed due to medication being on hold? No      BIMZELX  320 mg/2 mL subcutaneous injection (bimekizumab -bkzx): 0 doses of medicine on hand       REFERRAL TO PHARMACIST     Referral to the pharmacist: Not needed      [facility]     Shipping address confirmed in Epic.     Cost and Payment: Patient has a $0 copay, payment information is not required.    Delivery Scheduled: Yes, Expected medication delivery date: 11/01/2023.     Medication will be delivered via UPS to the prescription address in Epic WAM.    [facility]  Specialty Technician

## 2023-10-30 NOTE — Unmapped (Signed)
 [facility] has made a second and final attempt to reach this patient to refill the following medication:BIMZELX  320 mg/2 mL subcutaneous injection (bimekizumab -bkzx).      We have left voicemails on the following phone numbers: [phone] and [phone], have been unable to leave messages on the following phone numbers: [phone], have sent a MyChart message, have sent a text message to the following phone numbers: [phone], and have sent a Mychart questionnaire..    Dates contacted: 10/25/2023 and 10/30/2023  Last scheduled delivery: 10/03/2023    The patient may be at risk of non-compliance with this medication. The patient should call the [facility] at [phone]  Option 4, then Option 2: Dermatology, Gastroenterology, Rheumatology to refill medication.    Lucie HERO Kelyn Koskela   [place] [facility] Oncologist

## 2023-10-31 MED FILL — BIMZELX 320 MG/2 ML SUBCUTANEOUS SYRINGE: SUBCUTANEOUS | 28 days supply | Qty: 2 | Fill #6

## 2023-11-13 DIAGNOSIS — L732 Hidradenitis suppurativa: Principal | ICD-10-CM

## 2023-11-17 ENCOUNTER — Ambulatory Visit
Admission: EM | Admit: 2023-11-17 | Discharge: 2023-11-17 | Disposition: A | Discharge status: Home / Self Care | Attending: Nurse Practitioner | Admitting: Nurse Practitioner

## 2023-11-17 ENCOUNTER — Telehealth

## 2023-11-17 ENCOUNTER — Ambulatory Visit

## 2023-11-17 DIAGNOSIS — L0100 Impetigo, unspecified: Secondary | ICD-10-CM

## 2023-11-17 DIAGNOSIS — H5789 Other specified disorders of eye and adnexa: Secondary | ICD-10-CM

## 2023-11-17 MED ORDER — TOBRAMYCIN-DEXAMETHASONE 0.3-0.1 % OP SUSP
1.0000 [drp] | Freq: Four times a day (QID) | OPHTHALMIC | 0 refills | Status: AC
Start: 2023-11-17 — End: 2023-11-24

## 2023-11-17 MED ORDER — CEPHALEXIN 500 MG PO CAPS: 500.0000 mg | ORAL_CAPSULE | Freq: Four times a day (QID) | ORAL | 0 refills | Status: DC

## 2023-11-17 NOTE — Discharge Instructions (Addendum)
 Take medication as prescribed. You may take over-the-counter Tylenol  or ibuprofen  as needed for pain or discomfort. Do not pick or disrupt the areas in or under your nose.  Recommend normal saline nasal spray throughout the day to help keep the nasal passages moist and lubricated. Apply warm compresses to the eye to help with irritation and discomfort.  You may apply cool compresses to help with pain or swelling. Strict hand hygiene when applying eyedrops to the eye. You may use over-the-counter Visine or Clear Eyes eyedrops during the day to help keep the eyes moist and lubricated. If symptoms fail to improve with this treatment, you may follow-up in this clinic or with your primary care physician for further evaluation. Follow-up as needed.

## 2023-11-17 NOTE — ED Provider Notes (Signed)
 [facility]    CSN: [id number] Arrival date & time: 11/17/23  0955      History   Chief Complaint No chief complaint on file.   HPI Courtney [name] is a 33 y.o. female.   The history is provided by the patient.   Patient presents for complaints of scabbing and in the nose and redness in the left eye.  Patient states symptoms have been present for the past several days.  She states that the scabbing in and around her nose has a yellow drainage.  She states that the left eye has been burning and itching.  She denies fever, chills, visual changes, purulent drainage from the eye, eye swelling, or swelling around her nose.  Patient states that each time she blows her nose, it bleeds.  States that she has tried erythromycin ointment in the left eye with minimal relief of her symptoms.  States prior to her symptoms starting, she had a upper respiratory infection. Past Medical History:  Diagnosis Date   ADHD (attention deficit hyperactivity disorder)    Bipolar disorder (HCC)    Body piercing    Nose, frenulum, nipples   Chronic tonsillitis 08/2013   current strep, will finish antibiotic 09/04/2013; snores during sleep, mother denies apnea   Conductive hearing loss of left ear    Heart murmur    when I was born; outgrew it; I was a preemie   Hidradenitis    Hidradenitis 2017   Hx of substance abuse (HCC)    Migraine 12/19/2016   I've just had this one for 3 days (12/20/2016)   Pre-diabetes    Pulmonary emboli (HCC) 07/02/2021   BL   Septic shock (HCC) 07/02/2021    Patient Active Problem List   Diagnosis Date Noted   Polypharmacy 01/17/2023   Abnormal urinalysis 01/17/2023   History of substance abuse (HCC) 01/17/2023   Hospital discharge follow-up 01/17/2023   Acute cystitis 01/02/2023   Left ureteral stone 01/02/2023   CAP (community acquired pneumonia) 01/02/2023   Polysubstance abuse (HCC) 01/02/2023   Methamphetamine use (HCC) 11/01/2022   Otitis externa  of both ears 05/16/2022   Chronic rhinitis 02/15/2022   Shellfish allergy 02/15/2022   Pulmonary embolism (HCC) 07/19/2021   Hypotension 07/11/2021   Trichomonas vaginitis 07/05/2021   Acute metabolic encephalopathy 07/05/2021   Bilateral pulmonary embolism (HCC) 07/05/2021   Anxiety disorder 06/28/2021   Prediabetes 05/25/2021   Allergy to adhesive tape 05/25/2021   Hidradenitis suppurativa 05/23/2021   Insomnia 03/27/2021   Anemia 12/21/2020   Hypoglycemia 11/17/2020   Bipolar disorder, in full remission, most recent episode mixed (HCC) 10/07/2020   Bipolar disorder, in full remission, most recent episode depressed (HCC) 03/03/2020   Bipolar 2 disorder, major depressive episode (HCC) 08/04/2019   At risk for long QT syndrome 08/04/2019   Obesity (BMI 35.0-39.9 without comorbidity) 03/19/2019   Reactive airway disease 02/11/2019   Conductive hearing loss 03/13/2018   Axillary hidradenitis suppurativa 04/03/2017   Preventative health care 12/06/2016   Vitamin D  deficiency 10/18/2014   Lethargy 07/21/2014   Attention deficit hyperactivity disorder (ADHD), predominantly hyperactive type 02/11/2012    Past Surgical History:  Procedure Laterality Date   ADENOIDECTOMY N/A 10/15/2022   Procedure: ADENOIDECTOMY;  Surgeon: Jesus Oliphant, MD;  Location: [facility];  Service: ENT;  Laterality: N/A;   AXILLARY HIDRADENITIS EXCISION     AXILLARY LYMPH NODE BIOPSY Left 01/06/2019   CARPOMETACARPAL ([facility]) FUSION OF THUMB Right 08/06/2023   Procedure:  CARPOMETACARPAL [facility]) FUSION OF THUMB;  Surgeon: Kathlynn Sharper, MD;  Location: [facility];  Service: Orthopedics;  Laterality: Right;  Per Dr. Kathlynn, a carpometacarpal arthroplasty, but no bone removed   CYSTOSCOPY W/ URETERAL STENT PLACEMENT Right 05/28/2017   Procedure: CYSTOSCOPY WITH RETROGRADE PYELOGRAM/ RIGHT URETERAL STENT PLACEMENT;  Surgeon: Cam Morene ORN, MD;  Location: [facility];  Service: Urology;  Laterality:  Right;   DORSAL COMPARTMENT RELEASE Right 08/06/2023   Procedure: RELEASE, FIRST DORSAL COMPARTMENT, HAND;  Surgeon: Kathlynn Sharper, MD;  Location: [facility];  Service: Orthopedics;  Laterality: Right;   HYDRADENITIS EXCISION Bilateral 04/03/2017   Procedure: EXCISION AND DRAINAGE BILATERAL  HIDRADENITIS AXILLA;  Surgeon: Curvin Deward MOULD, MD;  Location: [facility] OR;  Service: General;  Laterality: Bilateral;   INCISION AND DRAINAGE ABSCESS Right 07/20/2016   Procedure: INCISION AND DRAINAGE ABSCESS right axilla;  Surgeon: Vernetta Berg, MD;  Location: [facility] OR;  Service: General;  Laterality: Right;   INTRAUTERINE DEVICE (IUD) INSERTION  11/2016   had the one in my left arm removed   IRRIGATION AND DEBRIDEMENT ABSCESS Right 12/21/2016   Procedure: IRRIGATION AND DEBRIDEMENT RIGHT AXILLARY HIDRADENITIS;  Surgeon: Teresa Lonni HERO, MD;  Location: [facility] OR;  Service: General;  Laterality: Right;   IRRIGATION AND DEBRIDEMENT ABSCESS Left 01/08/2017   Procedure: IRRIGATION AND DEBRIDEMENT AXILLARY ABSCESS;  Surgeon: Curvin Deward MOULD, MD;  Location: [facility] OR;  Service: General;  Laterality: Left;   NASAL SEPTOPLASTY W/ TURBINOPLASTY Bilateral 02/25/2019   Procedure: NASAL SEPTOPLASTY WITH TURBINATE REDUCTION;  Surgeon: Jesus Oliphant, MD;  Location: [facility];  Service: ENT;  Laterality: Bilateral;   TONSILLECTOMY Bilateral 09/14/2013   Procedure: BILATERAL TONSILLECTOMY;  Surgeon: Oliphant Jesus, MD;  Location:  SURGERY CENTER;  Service: ENT;  Laterality: Bilateral;   TYMPANOPLASTY Bilateral    rebuilt eardrums   WISDOM TOOTH EXTRACTION      OB History   No obstetric history on file.      Home Medications    Prior to Admission medications   Medication Sig Start Date End Date Taking? Authorizing Provider  cephALEXin  (KEFLEX ) 500 MG capsule Take 1 capsule (500 mg total) by mouth 4 (four) times daily. 11/17/23  Yes Leath-Warren, Etta PARAS, NP  tobramycin -dexamethasone   (TOBRADEX ) ophthalmic solution Place 1 drop into both eyes every 6 (six) hours for 7 days. 11/17/23 11/24/23 Yes Leath-Warren, Etta PARAS, NP  albuterol  (VENTOLIN  HFA) 108 (90 Base) MCG/ACT inhaler TAKE 2 PUFFS BY MOUTH EVERY 6 HOURS AS NEEDED FOR WHEEZE OR SHORTNESS OF BREATH 07/05/22   Wendee Lynwood HERO, NP  bifidobacterium infantis (ALIGN) capsule Take 1 capsule by mouth daily.    [provider]  BIMZELX 160 MG/ML pen Inject into the skin. 05/15/22   [provider]  buprenorphine -naloxone (SUBOXONE) 2-0.5 mg SUBL SL tablet Place 1 tablet under the tongue. 8-2 mg    [provider]  chlorhexidine  (PERIDEX ) 0.12 % solution Use as directed 15 mLs in the mouth or throat 2 (two) times daily. 05/29/23   Stuart Vernell Norris, PA-C  diphenhydrAMINE  (BENADRYL ) 25 mg capsule 25 mg every 6 (six) hours as needed.    [provider]  doxycycline  (VIBRAMYCIN ) 100 MG capsule Take 1 capsule (100 mg total) by mouth 2 (two) times daily. 05/29/23   Stuart Vernell Norris, PA-C  EPINEPHrine  (EPIPEN  2-PAK) 0.3 mg/0.3 mL IJ SOAJ injection Inject 0.3 mg into the muscle as needed for anaphylaxis. 11/01/22   Wendee Lynwood HERO, NP  glucose blood (CONTOUR  TEST) test strip And lancets #100 05/18/21   Kassie Mallick, MD  HYDROcodone -acetaminophen  (NORCO/VICODIN) 5-325 MG tablet Take 1 tablet by mouth every 4 (four) hours as needed for moderate pain (pain score 4-6). 08/06/23   Kathlynn Sharper, MD  hydrOXYzine  (ATARAX ) 25 MG tablet TAKE 1 TABLET BY MOUTH EVERY 8 HOURS AS NEEDED FOR ITCHING. Patient not taking: Reported on 07/30/2023 01/16/22   Webb, Padonda B, FNP  inFLIXimab  (REMICADE  IV) Inject 1 Dose into the vein every 30 (thirty) days.    [provider]  inFLIXimab  (REMICADE ) 100 MG injection     [provider]  Iron-Vitamin C 100-250 MG TABS Take 1 tablet by mouth daily.    [provider]  lidocaine  (XYLOCAINE ) 2 % solution May soak a cotton swab and set it onto the area  of pain every 3 hours as needed Patient not taking: Reported on 07/30/2023 05/29/23   Stuart Vernell Norris, PA-C  methylphenidate  (RITALIN ) 20 MG tablet Take 1 tablet (20 mg total) by mouth daily before breakfast. 01/03/23   Wendee Lynwood HERO, NP  Microlet Lancets MISC See admin instructions. 05/18/21   [provider]  mometasone  (ELOCON ) 0.1 % lotion Apply 1 Application topically daily.    [provider]  montelukast  (SINGULAIR ) 10 MG tablet TAKE 1 TABLET BY MOUTH EVERYDAY AT BEDTIME 05/16/22   Wendee Lynwood HERO, NP  neomycin -polymyxin-hydrocortisone  (CORTISPORIN ) 3.5-10000-1 OTIC suspension Place 3 drops into both ears 3 (three) times daily for 7 days 04/25/23     norethindrone (MICRONOR) 0.35 MG tablet Take 1 tablet by mouth daily. 07/19/21   [provider]  ondansetron  (ZOFRAN -ODT) 4 MG disintegrating tablet Take 1 tablet (4 mg total) by mouth every 8 (eight) hours as needed. 02/24/23   Leath-Warren, Etta PARAS, NP  SUMAtriptan (IMITREX) 100 MG tablet Take by mouth. 10/03/22   [provider]  triamcinolone  ointment (KENALOG ) 0.1 % PLEASE SEE ATTACHED FOR DETAILED DIRECTIONS    [provider]  venlafaxine (EFFEXOR) 37.5 MG tablet Take 37.5 mg by mouth in the morning and at bedtime. START WITH 1 TABLET DAILY FOR ONE WEEK, THEN INCREASE TO 1 TABLET TWICE PER DAY FOR HEADACHES. 12/24/22   [provider]  Vitamin D , Ergocalciferol , (DRISDOL ) 1.25 MG (50000 UNIT) CAPS capsule Take 1 capsule (50,000 Units total) by mouth every 7 (seven) days. 12/12/22   Wendee Lynwood HERO, NP    Family History Family History  Problem Relation Age of Onset   Hypertension Mother    Hyperlipidemia Mother    Diabetes Mother    Hyperlipidemia Father    Heart attack Father 38   Alcohol abuse Paternal Uncle    Parkinson's disease Maternal Grandmother    Dementia Maternal Grandmother    Diabetes Maternal Grandmother    Bell's palsy Maternal Grandmother     Social  History Social History   Tobacco Use   Smoking status: Former    Current packs/day: 0.00    Average packs/day: 1 pack/day for 13.3 years (13.3 ttl pk-yrs)    Types: Cigarettes    Start date: 2010    Quit date: 07/03/2021    Years since quitting: 2.3   Smokeless tobacco: Never   Tobacco comments:    May 2023, switched  from smoking to vaping  Vaping Use   Vaping status: Every Day   Last attempt to quit: 07/03/2021   Substances: Nicotine   Devices: Various - disposable  Substance Use Topics   Alcohol use: Not Currently  Comment: once a month   Drug use: Not Currently    Types: Methamphetamines, Cocaine    Comment: opioids     Allergies   Iodine, Other, Shellfish allergy, Tape, Bactrim  [sulfamethoxazole -trimethoprim ], Ginger, Morphine  and codeine , and Vancomycin    Review of Systems Review of Systems Per HPI  Physical Exam Triage Vital Signs ED Triage Vitals  Encounter Vitals Group     BP 11/17/23 1007 107/73     Girls Systolic BP Percentile --      Girls Diastolic BP Percentile --      Boys Systolic BP Percentile --      Boys Diastolic BP Percentile --      Pulse Rate 11/17/23 1007 (!) 108     Resp 11/17/23 1007 16     Temp 11/17/23 1007 98.6 F (37 C)     Temp Source 11/17/23 1007 Oral     SpO2 11/17/23 1007 98 %     Weight --      Height --      Head Circumference --      Peak Flow --      Pain Score 11/17/23 1009 0     Pain Loc --      Pain Education --      Exclude from Growth Chart --    No data found.  Updated Vital Signs BP 107/73 (BP Location: Right Arm)   Pulse (!) 108   Temp 98.6 F (37 C) (Oral)   Resp 16   LMP 11/04/2023 (Approximate)   SpO2 98%   Visual Acuity Right Eye Distance:   Left Eye Distance:   Bilateral Distance:    Right Eye Near:   Left Eye Near:    Bilateral Near:     Physical Exam Vitals and nursing note reviewed.  Constitutional:      General: She is not in acute distress.    Appearance: Normal appearance.   HENT:     Head: Normocephalic.     Right Ear: Tympanic membrane, ear canal and external ear normal.     Left Ear: Tympanic membrane, ear canal and external ear normal.     Nose: Congestion present.      Mouth/Throat:     Mouth: Mucous membranes are moist.  Eyes:     General: Lids are normal.        Left eye: Discharge present.No foreign body.     Extraocular Movements: Extraocular movements intact.     Left eye: Normal extraocular motion and no nystagmus.     Conjunctiva/sclera:     Right eye: Right conjunctiva is injected. No chemosis, exudate or hemorrhage.    Left eye: Left conjunctiva is injected. No chemosis, exudate or hemorrhage.    Pupils: Pupils are equal, round, and reactive to light.  Cardiovascular:     Rate and Rhythm: Normal rate and regular rhythm.     Pulses: Normal pulses.     Heart sounds: Normal heart sounds.  Pulmonary:     Effort: Pulmonary effort is normal.     Breath sounds: Normal breath sounds.  Musculoskeletal:     Cervical back: Normal range of motion.  Skin:    General: Skin is warm and dry.  Neurological:     General: No focal deficit present.     Mental Status: She is alert and oriented to person, place, and time.  Psychiatric:        Mood and Affect: Mood normal.        Behavior: Behavior  normal.      UC Treatments / Results  Labs (all labs ordered are listed, but only abnormal results are displayed) Labs Reviewed - No data to display  EKG   Radiology No results found.  Procedures Procedures (including critical care time)  Medications Ordered in UC Medications - No data to display  Initial Impression / Assessment and Plan / UC Course  I have reviewed the triage vital signs and the nursing notes.  Pertinent labs & imaging results that were available during my care of the patient were reviewed by me and considered in my medical decision making (see chart for details).  Symptoms concerning for impetigo.  With regard to her left  eye, cannot rule out conjunctivitis.  Will treat empirically with Keflex  500 mg 4 times daily for the next 7 days and Tobradex  eyedrops to help with possible infection and irritation.  Supportive care recommendations were provided and discussed with the patient to include over-the-counter analgesics, warm compresses to the eye, use of over-the-counter eyedrops, and to avoid picking or disrupting the area around her nose.  Discussed indications with patient regarding follow-up.  Patient was in agreement with this plan of care and verbalizes understanding.  All questions were answered.  Patient stable for discharge.   Final Clinical Impressions(s) / UC Diagnoses   Final diagnoses:  Impetigo  Irritation of left eye     Discharge Instructions      Take medication as prescribed. You may take over-the-counter Tylenol  or ibuprofen  as needed for pain or discomfort. Do not pick or disrupt the areas in or under your nose.  Recommend normal saline nasal spray throughout the day to help keep the nasal passages moist and lubricated. Apply warm compresses to the eye to help with irritation and discomfort.  You may apply cool compresses to help with pain or swelling. Strict hand hygiene when applying eyedrops to the eye. You may use over-the-counter Visine or Clear Eyes eyedrops during the day to help keep the eyes moist and lubricated. If symptoms fail to improve with this treatment, you may follow-up in this clinic or with your primary care physician for further evaluation. Follow-up as needed.      ED Prescriptions     Medication Sig Dispense Auth. Provider   cephALEXin  (KEFLEX ) 500 MG capsule Take 1 capsule (500 mg total) by mouth 4 (four) times daily. 28 capsule Leath-Warren, Etta PARAS, NP   tobramycin -dexamethasone  (TOBRADEX ) ophthalmic solution Place 1 drop into both eyes every 6 (six) hours for 7 days. 5 mL Leath-Warren, Etta PARAS, NP      PDMP not reviewed this encounter.    Gilmer Etta PARAS, NP 11/17/23 1023

## 2023-11-17 NOTE — ED Triage Notes (Signed)
 Pt reports her left eye is itchy and burning x 1 day Has some scabs in he nostrils x 2 weeks

## 2023-11-18 ENCOUNTER — Other Ambulatory Visit: Payer: Self-pay | Admitting: Medical Genetics

## 2023-11-21 ENCOUNTER — Other Ambulatory Visit: Payer: Self-pay

## 2023-11-21 ENCOUNTER — Emergency Department (HOSPITAL_BASED_OUTPATIENT_CLINIC_OR_DEPARTMENT_OTHER): Admission: EM | Admit: 2023-11-21 | Discharge: 2023-11-21 | Disposition: A | Discharge status: Home / Self Care

## 2023-11-21 ENCOUNTER — Encounter (HOSPITAL_BASED_OUTPATIENT_CLINIC_OR_DEPARTMENT_OTHER): Payer: Self-pay | Admitting: Emergency Medicine

## 2023-11-21 DIAGNOSIS — L0100 Impetigo, unspecified: Secondary | ICD-10-CM | POA: Diagnosis not present

## 2023-11-21 DIAGNOSIS — R21 Rash and other nonspecific skin eruption: Secondary | ICD-10-CM | POA: Diagnosis not present

## 2023-11-21 MED ORDER — DOXYCYCLINE HYCLATE 100 MG PO TABS
100.0000 mg | ORAL_TABLET | Freq: Once | ORAL | Status: AC
  Administered 2023-11-21: 100 mg via ORAL
  Filled 2023-11-21: qty 1

## 2023-11-21 MED ORDER — DOXYCYCLINE HYCLATE 100 MG PO CAPS: 100.0000 mg | ORAL_CAPSULE | Freq: Two times a day (BID) | ORAL | 0 refills | Status: AC

## 2023-11-21 NOTE — ED Notes (Signed)
 Reviewed AVS/discharge instruction with patient. Time allotted for and all questions answered. Patient is agreeable for d/c and escorted to ed exit by staff.

## 2023-11-21 NOTE — ED Provider Notes (Signed)
 [place] EMERGENCY DEPARTMENT AT [facility] Provider Note   CSN: [id number] Arrival date & time: 11/21/23  2035     Patient presents with: Rash   Courtney [name] is a 33 y.o. female.   33 year old female presents for evaluation of rash around her nostril.  She was recently treated for impetigo with Keflex  but states it has only gotten worse.  She has a history of positive MRSA swab while inpatient in the hospital previously.  She denies any other symptoms or concerns at this time.   Rash Associated symptoms: no abdominal pain, no fever, no joint pain, no shortness of breath, no sore throat and not vomiting        Prior to Admission medications   Medication Sig Start Date End Date Taking? Authorizing Provider  doxycycline  (VIBRAMYCIN ) 100 MG capsule Take 1 capsule (100 mg total) by mouth 2 (two) times daily for 7 days. 11/21/23 11/28/23 Yes Kyanne Rials L, DO  albuterol  (VENTOLIN  HFA) 108 (90 Base) MCG/ACT inhaler TAKE 2 PUFFS BY MOUTH EVERY 6 HOURS AS NEEDED FOR WHEEZE OR SHORTNESS OF BREATH 07/05/22   Wendee Lynwood HERO, NP  bifidobacterium infantis (ALIGN) capsule Take 1 capsule by mouth daily.    [provider]  BIMZELX 160 MG/ML pen Inject into the skin. 05/15/22   [provider]  buprenorphine -naloxone (SUBOXONE) 2-0.5 mg SUBL SL tablet Place 1 tablet under the tongue. 8-2 mg    [provider]  cephALEXin  (KEFLEX ) 500 MG capsule Take 1 capsule (500 mg total) by mouth 4 (four) times daily. 11/17/23   Leath-Warren, Etta PARAS, NP  chlorhexidine  (PERIDEX ) 0.12 % solution Use as directed 15 mLs in the mouth or throat 2 (two) times daily. 05/29/23   Stuart Vernell Norris, PA-C  diphenhydrAMINE  (BENADRYL ) 25 mg capsule 25 mg every 6 (six) hours as needed.    [provider]  EPINEPHrine  (EPIPEN  2-PAK) 0.3 mg/0.3 mL IJ SOAJ injection Inject 0.3 mg into the muscle as needed for anaphylaxis. 11/01/22   Wendee Lynwood HERO, NP  glucose blood (CONTOUR  TEST) test strip And lancets #100 05/18/21   Kassie Mallick, MD  HYDROcodone -acetaminophen  (NORCO/VICODIN) 5-325 MG tablet Take 1 tablet by mouth every 4 (four) hours as needed for moderate pain (pain score 4-6). 08/06/23   Kathlynn Sharper, MD  hydrOXYzine  (ATARAX ) 25 MG tablet TAKE 1 TABLET BY MOUTH EVERY 8 HOURS AS NEEDED FOR ITCHING. Patient not taking: Reported on 07/30/2023 01/16/22   Webb, Padonda B, FNP  inFLIXimab  (REMICADE  IV) Inject 1 Dose into the vein every 30 (thirty) days.    [provider]  inFLIXimab  (REMICADE ) 100 MG injection     [provider]  Iron-Vitamin C 100-250 MG TABS Take 1 tablet by mouth daily.    [provider]  lidocaine  (XYLOCAINE ) 2 % solution May soak a cotton swab and set it onto the area of pain every 3 hours as needed Patient not taking: Reported on 07/30/2023 05/29/23   Stuart Vernell Norris, PA-C  methylphenidate  (RITALIN ) 20 MG tablet Take 1 tablet (20 mg total) by mouth daily before breakfast. 01/03/23   Wendee Lynwood HERO, NP  Microlet Lancets MISC See admin instructions. 05/18/21   [provider]  mometasone  (ELOCON ) 0.1 % lotion Apply 1 Application topically daily.    [provider]  montelukast  (SINGULAIR ) 10 MG tablet TAKE 1 TABLET BY MOUTH EVERYDAY AT BEDTIME 05/16/22   Wendee Lynwood HERO, NP  neomycin -polymyxin-hydrocortisone  (CORTISPORIN ) 3.5-10000-1 OTIC suspension Place 3 drops into  both ears 3 (three) times daily for 7 days 04/25/23     norethindrone (MICRONOR) 0.35 MG tablet Take 1 tablet by mouth daily. 07/19/21   [provider]  ondansetron  (ZOFRAN -ODT) 4 MG disintegrating tablet Take 1 tablet (4 mg total) by mouth every 8 (eight) hours as needed. 02/24/23   Leath-Warren, Etta PARAS, NP  SUMAtriptan (IMITREX) 100 MG tablet Take by mouth. 10/03/22   [provider]  tobramycin -dexamethasone  (TOBRADEX ) ophthalmic solution Place 1 drop into both eyes every 6 (six) hours for 7 days. 11/17/23 11/24/23   Leath-Warren, Etta PARAS, NP  triamcinolone  ointment (KENALOG ) 0.1 % PLEASE SEE ATTACHED FOR DETAILED DIRECTIONS    [provider]  venlafaxine (EFFEXOR) 37.5 MG tablet Take 37.5 mg by mouth in the morning and at bedtime. START WITH 1 TABLET DAILY FOR ONE WEEK, THEN INCREASE TO 1 TABLET TWICE PER DAY FOR HEADACHES. 12/24/22   [provider]  Vitamin D , Ergocalciferol , (DRISDOL ) 1.25 MG (50000 UNIT) CAPS capsule Take 1 capsule (50,000 Units total) by mouth every 7 (seven) days. 12/12/22   Wendee Lynwood HERO, NP    Allergies: Iodine, Other, Shellfish allergy, Tape, Bactrim  [sulfamethoxazole -trimethoprim ], Ginger, Morphine  and codeine , and Vancomycin     Review of Systems  Constitutional:  Negative for chills and fever.  HENT:  Negative for ear pain and sore throat.        Rash around nostrils  Eyes:  Negative for pain and visual disturbance.  Respiratory:  Negative for cough and shortness of breath.   Cardiovascular:  Negative for chest pain and palpitations.  Gastrointestinal:  Negative for abdominal pain and vomiting.  Genitourinary:  Negative for dysuria and hematuria.  Musculoskeletal:  Negative for arthralgias and back pain.  Skin:  Positive for rash. Negative for color change.  Neurological:  Negative for seizures and syncope.  All other systems reviewed and are negative.   Updated Vital Signs BP 111/80 (BP Location: Right Arm)   Pulse 83   Temp 98.4 F (36.9 C) (Oral)   Resp 18   LMP 11/04/2023 (Approximate)   SpO2 95%   Physical Exam Vitals and nursing note reviewed.  Constitutional:      General: She is not in acute distress.    Appearance: Normal appearance. She is well-developed.  HENT:     Head: Normocephalic and atraumatic.     Nose:     Comments: Impetigo appearing rash around left nostril cracked skin Eyes:     Conjunctiva/sclera: Conjunctivae normal.  Cardiovascular:     Rate and Rhythm: Normal rate and regular rhythm.     Heart sounds: No  murmur heard. Pulmonary:     Effort: Pulmonary effort is normal. No respiratory distress.     Breath sounds: Normal breath sounds.  Abdominal:     Palpations: Abdomen is soft.     Tenderness: There is no abdominal tenderness.  Musculoskeletal:        General: No swelling.     Cervical back: Neck supple.  Skin:    General: Skin is warm and dry.     Capillary Refill: Capillary refill takes less than 2 seconds.  Neurological:     Mental Status: She is alert.  Psychiatric:        Mood and Affect: Mood normal.     (all labs ordered are listed, but only abnormal results are displayed) Labs Reviewed - No data to display  EKG: None  Radiology: No results found.   Procedures   Medications Ordered in the ED  doxycycline  (VIBRA -TABS) tablet 100 mg (100 mg Oral Given 11/21/23 2140)                                    Medical Decision Making Will treat patient for MRSA impetigo with doxycycline .  She is allergic to Bactrim .  Will give her her first dose here and a prescription for.  Advised Aquaphor as needed for dry skin.  Vies close follow-up with primary care and otherwise return to the ER for new or worsening symptoms.  She feels comfortable being discharged home.  Problems Addressed: Impetigo: acute illness or injury  Amount and/or Complexity of Data Reviewed External Data Reviewed: notes.    Details: Prior urgent care records reviewed and patient was treated for impetigo with Keflex  recently  Risk OTC drugs. Prescription drug management.     Final diagnoses:  Impetigo    ED Discharge Orders          Ordered    doxycycline  (VIBRAMYCIN ) 100 MG capsule  2 times daily        11/21/23 2135               Gennaro Duwaine CROME, DO 11/21/23 2147

## 2023-11-21 NOTE — Discharge Instructions (Signed)
 You can use Aquaphor as needed in your nose for your symptoms.  Take your doxycycline  as prescribed.

## 2023-11-21 NOTE — ED Triage Notes (Signed)
 Left side nostril rash See at UC treated from impetago with keflex  Got worse since

## 2023-11-25 ENCOUNTER — Encounter: Admit: 2023-11-25 | Discharge: 2023-11-26 | Payer: BLUE CROSS/BLUE SHIELD

## 2023-11-25 DIAGNOSIS — L732 Hidradenitis suppurativa: Principal | ICD-10-CM

## 2023-11-25 LAB — C-REACTIVE PROTEIN: C-REACTIVE PROTEIN: 13.1 mg/L — ABNORMAL HIGH (ref <=10.0)

## 2023-11-25 LAB — CBC W/ AUTO DIFF
BASOPHILS ABSOLUTE COUNT: 0.1 10*9/L (ref 0.0–0.1)
BASOPHILS RELATIVE PERCENT: 0.9 %
EOSINOPHILS ABSOLUTE COUNT: 0.4 10*9/L (ref 0.0–0.5)
EOSINOPHILS RELATIVE PERCENT: 3.2 %
HEMATOCRIT: 38.2 % (ref 34.0–44.0)
HEMOGLOBIN: 12.9 g/dL (ref 11.3–14.9)
LYMPHOCYTES ABSOLUTE COUNT: 3.9 10*9/L — ABNORMAL HIGH (ref 1.1–3.6)
LYMPHOCYTES RELATIVE PERCENT: 29.8 %
MEAN CORPUSCULAR HEMOGLOBIN CONC: 33.8 g/dL (ref 32.0–36.0)
MEAN CORPUSCULAR HEMOGLOBIN: 30.1 pg (ref 25.9–32.4)
MEAN CORPUSCULAR VOLUME: 89.1 fL (ref 77.6–95.7)
MEAN PLATELET VOLUME: 8.3 fL (ref 6.8–10.7)
MONOCYTES ABSOLUTE COUNT: 1.2 10*9/L — ABNORMAL HIGH (ref 0.3–0.8)
MONOCYTES RELATIVE PERCENT: 9.1 %
NEUTROPHILS ABSOLUTE COUNT: 7.5 10*9/L (ref 1.8–7.8)
NEUTROPHILS RELATIVE PERCENT: 57 %
PLATELET COUNT: 343 10*9/L (ref 150–450)
RED BLOOD CELL COUNT: 4.28 10*12/L (ref 3.95–5.13)
RED CELL DISTRIBUTION WIDTH: 12.8 % (ref 12.2–15.2)
WBC ADJUSTED: 13.2 10*9/L — ABNORMAL HIGH (ref 3.6–11.2)

## 2023-11-25 LAB — ALT: ALT (SGPT): 13 U/L (ref 10–49)

## 2023-11-25 LAB — SEDIMENTATION RATE: ERYTHROCYTE SEDIMENTATION RATE: 99 mm/h — ABNORMAL HIGH (ref 0–20)

## 2023-11-25 LAB — CREATININE
CREATININE: 0.62 mg/dL (ref 0.55–1.02)
EGFR CKD-EPI (2021) FEMALE: >90 mL/min/1.73m2 (ref >=60)

## 2023-11-25 LAB — BUN: BLOOD UREA NITROGEN: 7 mg/dL — ABNORMAL LOW (ref 9–23)

## 2023-11-25 MED ADMIN — ondansetron (ZOFRAN) injection 8 mg: 8 mg | INTRAVENOUS | @ 15:00:00 | Stop: 2023-11-25

## 2023-11-25 MED ADMIN — inFLIXimab-axxq (AVSOLA) 10 mg/kg = 800 mg in sodium chloride (NS) 250 mL IVPB: 10 mg/kg | INTRAVENOUS | @ 15:00:00 | Stop: 2023-11-25

## 2023-11-25 MED ADMIN — cetirizine (ZYRTEC) tablet 10 mg: 10 mg | ORAL | @ 15:00:00 | Stop: 2023-11-25

## 2023-11-25 MED ADMIN — acetaminophen (TYLENOL) tablet 650 mg: 650 mg | ORAL | @ 15:00:00 | Stop: 2023-11-25

## 2023-11-25 MED ADMIN — diphenhydrAMINE (BENADRYL) capsule/tablet 25 mg: 25 mg | ORAL | @ 15:00:00 | Stop: 2023-11-25

## 2023-11-25 NOTE — Unmapped (Signed)
 1000 Patient presents for standard Avsola  (infliximab -axxq) infusion.  In no acute distress. Vitals stable.  Reports no new medical issues or S/S of infection.  PIV placed.  See MAR for premeds.     1050 Avsola  (infliximab -axxq) 800 mg infusing as follows:      10ml/hr x 15 min  47ml/hr x 15 min  71ml/hr x 15 min  87ml/hr x 15 min  162ml/hr x 30 min  220ml/hr for the remainder of the infusion.     1257 Avsola  (infliximab -axxq) infusion complete.  PIV flushed with NS.  Vitals stable.  Patient without any s/s of adverse reaction.     1300 IV d/c'd.  Patient discharged from Infusion Center.

## 2023-11-25 NOTE — Unmapped (Signed)
 [facility] Refill Coordination Note    Melissa Pearson, [place]: 1990-09-03  Phone: [phone] (home) [phone] (work)      All above HIPAA information was verified with patient.         11/23/2023     6:29 PM   Specialty Rx Medication Refill Questionnaire   Which Medications would you like refilled and shipped? BIMZELX    Please list all current allergies: Morphine Sulfa  drugs iodine shellfish   Have you missed any doses in the last 30 days? No   Have you had any changes to your medication(s) since your last refill? No   How much of each medication do you have remaining at home? (eg. number of tablets, injections, etc.) 0   If receiving an injectable medication, next injection date is 11/30/2023   Have you experienced any side effects in the last 30 days? No   Please enter the full address (street address, city, state, zip code) where you would like your medication(s) to be delivered to. [address] , [place], [address]   Please specify on which day you would like your medication(s) to arrive. Note: if you need your medication(s) within 3 days, please call the pharmacy to schedule your order at [phone]  11/28/2023   Has your insurance changed since your last refill? No   Would you like a pharmacist to call you to discuss your medication(s)? No   Do you require a signature for your package? (Note: if we are billing Medicare Part B or your order contains a controlled substance, we will require a signature) No   I have been provided my out of pocket cost for my medication and approve the pharmacy to charge the amount to my credit card on file. Yes         Completed refill call assessment today to schedule patient's medication shipment from the [facility] [phone]).  All relevant notes have been reviewed.       Confirmed patient received a Conservation officer, historic buildings and a Surveyor, mining with first shipment. The patient will receive a drug information handout for each medication shipped and additional FDA Medication Guides as required.         REFERRAL TO PHARMACIST     Referral to the pharmacist: Not needed      [facility]     Shipping address confirmed in Epic.     Delivery Scheduled: Yes, Expected medication delivery date: 11/28/2023.     Medication will be delivered via UPS to the prescription address in Epic WAM.    Chiquita Gavel   [facility] Specialty Technician

## 2023-11-27 MED FILL — BIMZELX 320 MG/2 ML SUBCUTANEOUS SYRINGE: SUBCUTANEOUS | 28 days supply | Qty: 2 | Fill #7

## 2023-12-09 DIAGNOSIS — L732 Hidradenitis suppurativa: Principal | ICD-10-CM

## 2023-12-21 ENCOUNTER — Ambulatory Visit
Admission: EM | Admit: 2023-12-21 | Discharge: 2023-12-21 | Disposition: A | Discharge status: Home / Self Care | Attending: Nurse Practitioner | Admitting: Nurse Practitioner

## 2023-12-21 DIAGNOSIS — H5789 Other specified disorders of eye and adnexa: Secondary | ICD-10-CM

## 2023-12-21 DIAGNOSIS — L0100 Impetigo, unspecified: Secondary | ICD-10-CM | POA: Diagnosis not present

## 2023-12-21 MED ORDER — NEOMYCIN-POLYMYXIN-DEXAMETH 0.1 % OP OINT: 1.0000 | TOPICAL_OINTMENT | Freq: Two times a day (BID) | OPHTHALMIC | 0 refills | Status: AC

## 2023-12-21 MED ORDER — CLINDAMYCIN HCL 150 MG PO CAPS: 450.0000 mg | ORAL_CAPSULE | Freq: Three times a day (TID) | ORAL | 0 refills | Status: AC

## 2023-12-21 MED ORDER — MUPIROCIN 2 % EX OINT: 1.0000 | TOPICAL_OINTMENT | Freq: Two times a day (BID) | CUTANEOUS | 0 refills | Status: DC

## 2023-12-21 MED ORDER — CHLORHEXIDINE GLUCONATE 4 % EX SOLN: CUTANEOUS | 0 refills | Status: DC

## 2023-12-21 NOTE — ED Provider Notes (Signed)
 [facility]    CSN: [id number] Arrival date & time: 12/21/23  1151      History   Chief Complaint Chief Complaint  Patient presents with   Rash    HPI Courtney [name] is a 33 y.o. female.   The history is provided by the patient and a relative.   Patient presents for complaints of scabbing and in the nose bilateral cheeks and around the mouth. She also complains of bilateral eye redness and irritation. States symptoms have been present for the past several days. She states that the scabbing in and around her nose has a yellow drainage. She states that her eyes  been burning and itching. She denies fever, chills, visual changes, purulent drainage from the eye, eye swelling, or swelling around her nose.  Patient with prior history of impetigo per review of her chart.  Past Medical History:  Diagnosis Date   ADHD (attention deficit hyperactivity disorder)    Bipolar disorder (HCC)    Body piercing    Nose, frenulum, nipples   Chronic tonsillitis 08/2013   current strep, will finish antibiotic 09/04/2013; snores during sleep, mother denies apnea   Conductive hearing loss of left ear    Heart murmur    when I was born; outgrew it; I was a preemie   Hidradenitis    Hidradenitis 2017   Hx of substance abuse (HCC)    Migraine 12/19/2016   I've just had this one for 3 days (12/20/2016)   Pre-diabetes    Pulmonary emboli (HCC) 07/02/2021   BL   Septic shock (HCC) 07/02/2021    Patient Active Problem List   Diagnosis Date Noted   Polypharmacy 01/17/2023   Abnormal urinalysis 01/17/2023   History of substance abuse (HCC) 01/17/2023   Hospital discharge follow-up 01/17/2023   Acute cystitis 01/02/2023   Left ureteral stone 01/02/2023   CAP (community acquired pneumonia) 01/02/2023   Polysubstance abuse (HCC) 01/02/2023   Methamphetamine use 11/01/2022   Otitis externa of both ears 05/16/2022   Chronic rhinitis 02/15/2022   Shellfish allergy 02/15/2022    Pulmonary embolism (HCC) 07/19/2021   Hypotension 07/11/2021   Trichomonas vaginitis 07/05/2021   Acute metabolic encephalopathy 07/05/2021   Bilateral pulmonary embolism (HCC) 07/05/2021   Anxiety disorder 06/28/2021   Prediabetes 05/25/2021   Allergy to adhesive tape 05/25/2021   Hidradenitis suppurativa 05/23/2021   Insomnia 03/27/2021   Anemia 12/21/2020   Hypoglycemia 11/17/2020   Bipolar disorder, in full remission, most recent episode mixed 10/07/2020   Bipolar disorder, in full remission, most recent episode depressed 03/03/2020   Bipolar 2 disorder, major depressive episode (HCC) 08/04/2019   At risk for long QT syndrome 08/04/2019   Obesity (BMI 35.0-39.9 without comorbidity) 03/19/2019   Reactive airway disease 02/11/2019   Conductive hearing loss 03/13/2018   Axillary hidradenitis suppurativa 04/03/2017   Preventative health care 12/06/2016   Vitamin D  deficiency 10/18/2014   Lethargy 07/21/2014   Attention deficit hyperactivity disorder (ADHD), predominantly hyperactive type 02/11/2012    Past Surgical History:  Procedure Laterality Date   ADENOIDECTOMY N/A 10/15/2022   Procedure: ADENOIDECTOMY;  Surgeon: Jesus Oliphant, MD;  Location: [facility];  Service: ENT;  Laterality: N/A;   AXILLARY HIDRADENITIS EXCISION     AXILLARY LYMPH NODE BIOPSY Left 01/06/2019   CARPOMETACARPAL (CMC) FUSION OF THUMB Right 08/06/2023   Procedure: CARPOMETACARPAL (CMC) FUSION OF THUMB;  Surgeon: Kathlynn Sharper, MD;  Location: [facility];  Service: Orthopedics;  Laterality: Right;  Per Dr. Kathlynn, a carpometacarpal arthroplasty, but no bone removed   CYSTOSCOPY W/ URETERAL STENT PLACEMENT Right 05/28/2017   Procedure: CYSTOSCOPY WITH RETROGRADE PYELOGRAM/ RIGHT URETERAL STENT PLACEMENT;  Surgeon: Cam Morene ORN, MD;  Location: [facility];  Service: Urology;  Laterality: Right;   DORSAL COMPARTMENT RELEASE Right 08/06/2023   Procedure: RELEASE, FIRST DORSAL COMPARTMENT, HAND;   Surgeon: Kathlynn Sharper, MD;  Location: [facility];  Service: Orthopedics;  Laterality: Right;   HYDRADENITIS EXCISION Bilateral 04/03/2017   Procedure: EXCISION AND DRAINAGE BILATERAL  HIDRADENITIS AXILLA;  Surgeon: Curvin Deward MOULD, MD;  Location: [facility];  Service: General;  Laterality: Bilateral;   INCISION AND DRAINAGE ABSCESS Right 07/20/2016   Procedure: INCISION AND DRAINAGE ABSCESS right axilla;  Surgeon: Vernetta Berg, MD;  Location: [facility];  Service: General;  Laterality: Right;   INTRAUTERINE DEVICE (IUD) INSERTION  11/2016   had the one in my left arm removed   IRRIGATION AND DEBRIDEMENT ABSCESS Right 12/21/2016   Procedure: IRRIGATION AND DEBRIDEMENT RIGHT AXILLARY HIDRADENITIS;  Surgeon: Teresa Lonni HERO, MD;  Location: [facility];  Service: General;  Laterality: Right;   IRRIGATION AND DEBRIDEMENT ABSCESS Left 01/08/2017   Procedure: IRRIGATION AND DEBRIDEMENT AXILLARY ABSCESS;  Surgeon: Curvin Deward MOULD, MD;  Location: [facility] OR;  Service: General;  Laterality: Left;   NASAL SEPTOPLASTY W/ TURBINOPLASTY Bilateral 02/25/2019   Procedure: NASAL SEPTOPLASTY WITH TURBINATE REDUCTION;  Surgeon: Jesus Oliphant, MD;  Location: [facility];  Service: ENT;  Laterality: Bilateral;   TONSILLECTOMY Bilateral 09/14/2013   Procedure: BILATERAL TONSILLECTOMY;  Surgeon: Oliphant Jesus, MD;  Location: [facility];  Service: ENT;  Laterality: Bilateral;   TYMPANOPLASTY Bilateral    rebuilt eardrums   WISDOM TOOTH EXTRACTION      OB History   No obstetric history on [name].      Home Medications    Prior to Admission medications   Medication Sig Start Date End Date Taking? Authorizing Provider  albuterol  (VENTOLIN  HFA) 108 (90 Base) MCG/ACT inhaler TAKE 2 PUFFS BY MOUTH EVERY 6 HOURS AS NEEDED FOR WHEEZE OR SHORTNESS OF BREATH 07/05/22   Wendee Lynwood HERO, NP  bifidobacterium infantis (ALIGN) capsule Take 1 capsule by mouth daily. Patient not taking: Reported on  12/21/2023    [provider]  BIMZELX 160 MG/ML pen Inject into the skin. 05/15/22   [provider]  buprenorphine -naloxone (SUBOXONE) 2-0.5 mg SUBL SL tablet Place 1 tablet under the tongue. 8-2 mg    [provider]  cephALEXin  (KEFLEX ) 500 MG capsule Take 1 capsule (500 mg total) by mouth 4 (four) times daily. Patient not taking: Reported on 12/21/2023 11/17/23   Leath-Warren, Etta PARAS, NP  chlorhexidine  (PERIDEX ) 0.12 % solution Use as directed 15 mLs in the mouth or throat 2 (two) times daily. Patient not taking: Reported on 12/21/2023 05/29/23   Stuart Vernell Norris, PA-C  diphenhydrAMINE  (BENADRYL ) 25 mg capsule 25 mg every 6 (six) hours as needed.    [provider]  EPINEPHrine  (EPIPEN  2-PAK) 0.3 mg/0.3 mL IJ SOAJ injection Inject 0.3 mg into the muscle as needed for anaphylaxis. 11/01/22   Wendee Lynwood HERO, NP  glucose blood (CONTOUR TEST) test strip And lancets #100 05/18/21   Kassie Mallick, MD  HYDROcodone -acetaminophen  (NORCO/VICODIN) 5-325 MG tablet Take 1 tablet by mouth every 4 (four) hours as needed for moderate pain (pain score 4-6). Patient not taking: Reported on 12/21/2023 08/06/23   Kathlynn Sharper, MD  hydrOXYzine  (ATARAX ) 25 MG tablet TAKE  1 TABLET BY MOUTH EVERY 8 HOURS AS NEEDED FOR ITCHING. Patient not taking: Reported on 07/30/2023 01/16/22   Webb, Padonda B, FNP  inFLIXimab  (REMICADE  IV) Inject 1 Dose into the vein every 30 (thirty) days.    [provider]  inFLIXimab  (REMICADE ) 100 MG injection     [provider]  Iron-Vitamin C 100-250 MG TABS Take 1 tablet by mouth daily. Patient not taking: Reported on 12/21/2023    [provider]  lidocaine  (XYLOCAINE ) 2 % solution May soak a cotton swab and set it onto the area of pain every 3 hours as needed Patient not taking: Reported on 07/30/2023 05/29/23   Stuart Vernell Norris, PA-C  methylphenidate  (RITALIN ) 20 MG tablet Take 1 tablet (20 mg total) by mouth daily  before breakfast. 01/03/23   Wendee Lynwood HERO, NP  Microlet Lancets MISC See admin instructions. 05/18/21   [provider]  mometasone  (ELOCON ) 0.1 % lotion Apply 1 Application topically daily.    [provider]  montelukast  (SINGULAIR ) 10 MG tablet TAKE 1 TABLET BY MOUTH EVERYDAY AT BEDTIME 05/16/22   Wendee Lynwood HERO, NP  neomycin -polymyxin-hydrocortisone  (CORTISPORIN ) 3.5-10000-1 OTIC suspension Place 3 drops into both ears 3 (three) times daily for 7 days 04/25/23     norethindrone (MICRONOR) 0.35 MG tablet Take 1 tablet by mouth daily. 07/19/21   [provider]  ondansetron  (ZOFRAN -ODT) 4 MG disintegrating tablet Take 1 tablet (4 mg total) by mouth every 8 (eight) hours as needed. 02/24/23   Leath-Warren, Etta PARAS, NP  SUMAtriptan (IMITREX) 100 MG tablet Take by mouth. 10/03/22   [provider]  triamcinolone  ointment (KENALOG ) 0.1 % PLEASE SEE ATTACHED FOR DETAILED DIRECTIONS    [provider]  venlafaxine (EFFEXOR) 37.5 MG tablet Take 37.5 mg by mouth in the morning and at bedtime. START WITH 1 TABLET DAILY FOR ONE WEEK, THEN INCREASE TO 1 TABLET TWICE PER DAY FOR HEADACHES. 12/24/22   [provider]  Vitamin D , Ergocalciferol , (DRISDOL ) 1.25 MG (50000 UNIT) CAPS capsule Take 1 capsule (50,000 Units total) by mouth every 7 (seven) days. Patient not taking: Reported on 12/21/2023 12/12/22   Wendee Lynwood HERO, NP    Family History Family History  Problem Relation Age of Onset   Hypertension Mother    Hyperlipidemia Mother    Diabetes Mother    Hyperlipidemia Father    Heart attack Father 73   Alcohol abuse Paternal Uncle    Parkinson's disease Maternal Grandmother    Dementia Maternal Grandmother    Diabetes Maternal Grandmother    Bell's palsy Maternal Grandmother     Social History Social History   Tobacco Use   Smoking status: Former    Current packs/day: 0.00    Average packs/day: 1 pack/day for 13.3 years (13.3 ttl pk-yrs)     Types: Cigarettes    Start date: 2010    Quit date: 07/03/2021    Years since quitting: 2.4   Smokeless tobacco: Never   Tobacco comments:    May 2023, switched  from smoking to vaping  Vaping Use   Vaping status: Some Days   Last attempt to quit: 07/03/2021   Substances: Nicotine   Devices: Various - disposable  Substance Use Topics   Alcohol use: Yes    Comment: once a month   Drug use: Not Currently    Types: Methamphetamines, Cocaine    Comment: opioids, pt reports occassionally smoking marijuana     Allergies   Iodine, Other, Shellfish allergy,  Tape, Bactrim  [sulfamethoxazole -trimethoprim ], Ginger, Morphine  and codeine , Sulfa  antibiotics, and Vancomycin    Review of Systems Review of Systems Per HPI  Physical Exam Triage Vital Signs ED Triage Vitals [12/21/23 1201]  Encounter Vitals Group     BP 116/78     Girls Systolic BP Percentile      Girls Diastolic BP Percentile      Boys Systolic BP Percentile      Boys Diastolic BP Percentile      Pulse Rate (!) 112     Resp 19     Temp 98.5 F (36.9 C)     Temp Source Oral     SpO2 96 %     Weight      Height      Head Circumference      Peak Flow      Pain Score 10     Pain Loc      Pain Education      Exclude from Growth Chart    No data found.  Updated Vital Signs BP 116/78 (BP Location: Right Arm)   Pulse (!) 112   Temp 98.5 F (36.9 C) (Oral)   Resp 19   LMP 12/14/2023 (Approximate)   SpO2 96%   Visual Acuity Right Eye Distance:   Left Eye Distance:   Bilateral Distance:    Right Eye Near:   Left Eye Near:    Bilateral Near:     Physical Exam Vitals and nursing note reviewed.  Constitutional:      General: She is not in acute distress.    Appearance: Normal appearance.  HENT:     Head: Normocephalic.   Eyes:     General: Lids are normal. Vision grossly intact.        Right eye: Discharge present. No foreign body or hordeolum.        Left eye: Discharge present.No foreign body or  hordeolum.     Extraocular Movements: Extraocular movements intact.     Right eye: Normal extraocular motion and no nystagmus.     Left eye: Normal extraocular motion and no nystagmus.     Conjunctiva/sclera:     Right eye: Right conjunctiva is injected. No chemosis, exudate or hemorrhage.    Left eye: Left conjunctiva is injected. No chemosis, exudate or hemorrhage.    Pupils: Pupils are equal, round, and reactive to light.  Pulmonary:     Effort: Pulmonary effort is normal.     Breath sounds: Normal breath sounds.  Musculoskeletal:     Cervical back: Normal range of motion.  Skin:    General: Skin is warm and dry.     Findings: Erythema, rash and wound present. Rash is crusting.     Comments: Large areas of crusting and scabbing to the nose, and bilateral mouth.  Areas are oozing yellowish drainage.  Neurological:     General: No focal deficit present.     Mental Status: She is alert and oriented to person, place, and time.  Psychiatric:        Mood and Affect: Mood normal.        Behavior: Behavior normal.      UC Treatments / Results  Labs (all labs ordered are listed, but only abnormal results are displayed) Labs Reviewed - No data to display  EKG   Radiology No results found.  Procedures Procedures (including critical care time)  Medications Ordered in UC Medications - No data to display  Initial Impression / Assessment and Plan /  UC Course  I have reviewed the triage vital signs and the nursing notes.  Pertinent labs & imaging results that were available during my care of the patient were reviewed by me and considered in my medical decision making (see chart for details).  Symptoms consistent with ongoing impetigo.  With regard to her eyes, cannot rule out bacterial infection.  Patient was seen for the same or similar symptoms approximately 1 month ago.  Will treat empirically with clindamycin  450 mg 3 times daily for the next 7 days, mupirocin  ointment,  Hibiclens  4% solution, Maxitrol ointment for bacterial eye infection and irritation.  Supportive care recommendations were provided and discussed with the patient to include over-the-counter analgesics, warm compresses to the eye, use of over-the-counter eyedrops, and to avoid picking or disrupting the area around her nose.  Discussed indications with patient regarding follow-up, advised patient to consider follow-up with dermatology if symptoms fail to improve..  Patient was in agreement with this plan of care and verbalizes understanding.  All questions were answered.  Patient stable for discharge.   Final Clinical Impressions(s) / UC Diagnoses   Final diagnoses:  None   Discharge Instructions   None    ED Prescriptions   None    PDMP not reviewed this encounter.   Gilmer Etta PARAS, NP 12/21/23 [phone]

## 2023-12-21 NOTE — ED Triage Notes (Signed)
 Pt reports being seen in UC for facial skin irritation. Noticeable irritation on face. Pt reports redness and discomfort in eyes. Pt reports using aquafor  ointment and eye drops to help with discomfort with no relief.

## 2023-12-21 NOTE — Discharge Instructions (Signed)
 Take medication as prescribed. You may take over-the-counter Tylenol  or ibuprofen  as needed for pain or discomfort. Do not pick or disrupt the areas in or under your nose.  Recommend normal saline nasal spray throughout the day to help keep the nasal passages moist and lubricated. Apply warm compresses to the eye to help with irritation and discomfort.  You may apply cool compresses to help with pain or swelling. Strict hand hygiene when applying eyedrops to the eye. You may use over-the-counter Visine or Clear Eyes eyedrops during the day to help keep the eyes moist and lubricated. If symptoms fail to improve with this treatment, you may follow-up in this clinic or with your primary care physician for further evaluation. Follow-up as needed.

## 2023-12-25 ENCOUNTER — Encounter: Admit: 2023-12-25 | Discharge: 2023-12-26 | Payer: BLUE CROSS/BLUE SHIELD

## 2023-12-25 DIAGNOSIS — L732 Hidradenitis suppurativa: Secondary | ICD-10-CM | POA: Diagnosis not present

## 2023-12-25 MED ADMIN — cetirizine (ZYRTEC) tablet 10 mg: 10 mg | ORAL | @ 19:00:00 | Stop: 2023-12-25

## 2023-12-25 MED ADMIN — inFLIXimab-axxq (AVSOLA) 10 mg/kg = 700 mg in sodium chloride (NS) 250 mL IVPB: 10 mg/kg | INTRAVENOUS | @ 19:00:00 | Stop: 2023-12-25

## 2023-12-25 MED ADMIN — acetaminophen (TYLENOL) tablet 650 mg: 650 mg | ORAL | @ 19:00:00 | Stop: 2023-12-25

## 2023-12-25 MED ADMIN — diphenhydrAMINE (BENADRYL) capsule/tablet 25 mg: 25 mg | ORAL | @ 19:00:00 | Stop: 2023-12-25

## 2023-12-25 MED ADMIN — ondansetron (ZOFRAN) injection 8 mg: 8 mg | INTRAVENOUS | @ 19:00:00 | Stop: 2023-12-25

## 2023-12-25 NOTE — Unmapped (Signed)
 [facility] Refill Coordination Note    Melissa Pearson, [place]: Apr 04, 1990  Phone: [phone] (home) [phone] (work)      All above HIPAA information was verified with patient.         12/25/2023    12:41 PM   Specialty Rx Medication Refill Questionnaire   Which Medications would you like refilled and shipped? BIMZELX    Please list all current allergies: Morphine iodien shellfish   Have you missed any doses in the last 30 days? No   Have you had any changes to your medication(s) since your last refill? No   How much of each medication do you have remaining at home? (eg. number of tablets, injections, etc.) 0   If receiving an injectable medication, next injection date is 01/03/2024   Have you experienced any side effects in the last 30 days? No   Please enter the full address (street address, city, state, zip code) where you would like your medication(s) to be delivered to. [address], [place] [address]   Please specify on which day you would like your medication(s) to arrive. Note: if you need your medication(s) within 3 days, please call the pharmacy to schedule your order at [phone]  01/01/2024   Has your insurance changed since your last refill? No   Would you like a pharmacist to call you to discuss your medication(s)? No   Do you require a signature for your package? (Note: if we are billing Medicare Part B or your order contains a controlled substance, we will require a signature) No   I have been provided my out of pocket cost for my medication and approve the pharmacy to charge the amount to my credit card on file. Yes         Completed refill call assessment today to schedule patient's medication shipment from the [facility] [phone]).  All relevant notes have been reviewed.       Confirmed patient received a Conservation officer, historic buildings and a Surveyor, mining with first shipment. The patient will receive a drug information handout for each medication shipped and additional FDA Medication Guides as required.         REFERRAL TO PHARMACIST     Referral to the pharmacist: Not needed      [facility]     Shipping address confirmed in Epic.     Delivery Scheduled: Yes, Expected medication delivery date: 01/01/24.     Medication will be delivered via UPS to the prescription address in Epic WAM.    Melissa Pearson   [facility] Specialty Technician

## 2023-12-25 NOTE — Unmapped (Signed)
 Patient presents for standard Avsola  (infliximab -axxq) infusion.  In no acute distress. Vitals stable.  Reports no new medical issues or S/S of infection.  PIV placed in LFA.  See MAR for premeds.    1451 Avsola  (infliximab -axxq) 700 mg infusing as follows:     10ml/hr x 15 min  80ml/hr x 15 min  23ml/hr x 15 min  16ml/hr x 15 min  114ml/hr x 30 min  266ml/hr for the remainder of the infusion.    1655 Avsola  (infliximab -axxq) infusion complete.  PIV flushed with NS.  Vitals stable.  Patient without any s/s of adverse reaction.    IV d/c'd.  Patient discharged from Infusion Center.

## 2023-12-31 MED FILL — BIMZELX 320 MG/2 ML SUBCUTANEOUS SYRINGE: SUBCUTANEOUS | 28 days supply | Qty: 2 | Fill #8

## 2024-01-01 ENCOUNTER — Other Ambulatory Visit: Payer: Self-pay

## 2024-01-01 ENCOUNTER — Encounter (HOSPITAL_BASED_OUTPATIENT_CLINIC_OR_DEPARTMENT_OTHER): Payer: Self-pay | Admitting: Emergency Medicine

## 2024-01-01 ENCOUNTER — Emergency Department (HOSPITAL_BASED_OUTPATIENT_CLINIC_OR_DEPARTMENT_OTHER)
Admission: EM | Admit: 2024-01-01 | Discharge: 2024-01-01 | Disposition: A | Discharge status: Home / Self Care | Attending: Emergency Medicine | Admitting: Emergency Medicine

## 2024-01-01 DIAGNOSIS — R0981 Nasal congestion: Secondary | ICD-10-CM | POA: Diagnosis not present

## 2024-01-01 DIAGNOSIS — M542 Cervicalgia: Secondary | ICD-10-CM | POA: Insufficient documentation

## 2024-01-01 DIAGNOSIS — R509 Fever, unspecified: Secondary | ICD-10-CM | POA: Insufficient documentation

## 2024-01-01 DIAGNOSIS — H9202 Otalgia, left ear: Secondary | ICD-10-CM | POA: Diagnosis present

## 2024-01-01 DIAGNOSIS — J3489 Other specified disorders of nose and nasal sinuses: Secondary | ICD-10-CM | POA: Insufficient documentation

## 2024-01-01 DIAGNOSIS — H9209 Otalgia, unspecified ear: Secondary | ICD-10-CM | POA: Diagnosis not present

## 2024-01-01 DIAGNOSIS — H60312 Diffuse otitis externa, left ear: Secondary | ICD-10-CM

## 2024-01-01 MED ORDER — CIPROFLOXACIN-DEXAMETHASONE 0.3-0.1 % OT SUSP: 4.0000 [drp] | Freq: Two times a day (BID) | OTIC | 0 refills | Status: DC

## 2024-01-01 MED ORDER — HYDROCODONE-ACETAMINOPHEN 5-325 MG PO TABS
1.0000 | ORAL_TABLET | Freq: Once | ORAL | Status: AC
  Administered 2024-01-01: 1 via ORAL
  Filled 2024-01-01: qty 1

## 2024-01-01 MED ORDER — CIPROFLOXACIN-DEXAMETHASONE 0.3-0.1 % OT SUSP: 4.0000 [drp] | Freq: Two times a day (BID) | OTIC | 0 refills | Status: AC

## 2024-01-01 NOTE — ED Notes (Signed)
 ED Provider at bedside.

## 2024-01-01 NOTE — Discharge Instructions (Addendum)
 It was a pleasure seeing you today. Use antibiotic ear drops as directed. Continue with your scheduled appointment with ENT next week. Return if symptoms persist or worsen. Return if swelling behind the ear develops.

## 2024-01-01 NOTE — ED Triage Notes (Signed)
 Woke up with left ear pain  Not relieved with OTC pain meds Hx ear surgery

## 2024-01-01 NOTE — ED Provider Notes (Signed)
 [place] EMERGENCY DEPARTMENT AT [facility] Provider Note   CSN: [id number] Arrival date & time: 01/01/24  1853     Patient presents with: Otalgia   Courtney [name] is a 33 y.o. female.   Patient is here for evaluation of left sided ear pain. This pain began yesterday morning and woke patient from sleep. Pain radiates into her jaw and down her neck. She arrived by EMS today as turning her neck exacerbates her pain and she did not feel comfortable driving while being unable to turn her head easily. She reports worsening pain with laying on the left ear, bending down, and chewing. She has been taking alternating Tylenol  and ibuprofen  with minimal relief. She has noticed ear drainage from the left ear. She uses Q-tips. She has not experienced similar pain to this in the past. She does report bilateral ear surgeries and abscesses as a child. She last had ear abscesses when she was in middle school. She denies any recent ear infections. She reports hearing loss with current symptoms, and goes on to say she has a history of hearing loss as well. She is currently on Doxycycline  as prescribed by her dermatologist for recurrent staph infections. She has had an URI for the last couple weeks with cough, congestion, and rhinorrhea which she believes to be a sinus infection but has not been evaluated for. She reports low-grade fever at home today of 61F. She denies recent swimming or submersion in water. Denies any recent injuries to the left ear. Patient called ENT and the soonest they could schedule her was for next week.   The history is provided by the patient.  Otalgia Location:  Left Quality:  Aching, throbbing and shooting Severity:  Severe Onset quality:  Sudden Duration:  1 day Timing:  Constant Progression:  Worsening Chronicity:  New Context: recent URI   Context: not direct blow, not elevation change and not foreign body in ear   Relieved by:  Nothing Worsened by:   Position Ineffective treatments:  OTC medications Associated symptoms: congestion, cough, ear discharge, fever (Low-grade), hearing loss, neck pain and rhinorrhea   Associated symptoms: no vomiting   Risk factors: prior ear surgery   Risk factors: no chronic ear infection        Prior to Admission medications   Medication Sig Start Date End Date Taking? Authorizing Provider  albuterol  (VENTOLIN  HFA) 108 (90 Base) MCG/ACT inhaler TAKE 2 PUFFS BY MOUTH EVERY 6 HOURS AS NEEDED FOR WHEEZE OR SHORTNESS OF BREATH 07/05/22   Wendee Lynwood HERO, NP  bifidobacterium infantis (ALIGN) capsule Take 1 capsule by mouth daily. Patient not taking: Reported on 12/21/2023    [provider]  BIMZELX 160 MG/ML pen Inject into the skin. 05/15/22   [provider]  buprenorphine -naloxone (SUBOXONE) 2-0.5 mg SUBL SL tablet Place 1 tablet under the tongue. 8-2 mg    [provider]  cephALEXin  (KEFLEX ) 500 MG capsule Take 1 capsule (500 mg total) by mouth 4 (four) times daily. Patient not taking: Reported on 12/21/2023 11/17/23   Leath-Warren, Etta PARAS, NP  chlorhexidine  (HIBICLENS ) 4 % external liquid Apply a dime size amount of the solution to warm water and cleanse the affected areas twice daily until symptoms improve. 12/21/23   Leath-Warren, Etta PARAS, NP  chlorhexidine  (PERIDEX ) 0.12 % solution Use as directed 15 mLs in the mouth or throat 2 (two) times daily. Patient not taking: Reported on 12/21/2023 05/29/23   Stuart Vernell Norris, PA-C  diphenhydrAMINE  (BENADRYL )  25 mg capsule 25 mg every 6 (six) hours as needed.    [provider]  EPINEPHrine  (EPIPEN  2-PAK) 0.3 mg/0.3 mL IJ SOAJ injection Inject 0.3 mg into the muscle as needed for anaphylaxis. 11/01/22   Wendee Lynwood HERO, NP  glucose blood (CONTOUR TEST) test strip And lancets #100 05/18/21   Kassie Mallick, MD  HYDROcodone -acetaminophen  (NORCO/VICODIN) 5-325 MG tablet Take 1 tablet by mouth every 4 (four) hours as needed  for moderate pain (pain score 4-6). Patient not taking: Reported on 12/21/2023 08/06/23   Kathlynn Sharper, MD  hydrOXYzine  (ATARAX ) 25 MG tablet TAKE 1 TABLET BY MOUTH EVERY 8 HOURS AS NEEDED FOR ITCHING. Patient not taking: Reported on 07/30/2023 01/16/22   Webb, Padonda B, FNP  inFLIXimab  (REMICADE  IV) Inject 1 Dose into the vein every 30 (thirty) days.    [provider]  inFLIXimab  (REMICADE ) 100 MG injection     [provider]  Iron-Vitamin C 100-250 MG TABS Take 1 tablet by mouth daily. Patient not taking: Reported on 12/21/2023    [provider]  lidocaine  (XYLOCAINE ) 2 % solution May soak a cotton swab and set it onto the area of pain every 3 hours as needed Patient not taking: Reported on 07/30/2023 05/29/23   Stuart Vernell Norris, PA-C  methylphenidate  (RITALIN ) 20 MG tablet Take 1 tablet (20 mg total) by mouth daily before breakfast. 01/03/23   Wendee Lynwood HERO, NP  Microlet Lancets MISC See admin instructions. 05/18/21   [provider]  mometasone  (ELOCON ) 0.1 % lotion Apply 1 Application topically daily.    [provider]  montelukast  (SINGULAIR ) 10 MG tablet TAKE 1 TABLET BY MOUTH EVERYDAY AT BEDTIME 05/16/22   Wendee Lynwood HERO, NP  mupirocin  ointment (BACTROBAN ) 2 % Apply 1 Application topically 2 (two) times daily. 12/21/23   Leath-Warren, Etta PARAS, NP  neomycin -polymyxin-hydrocortisone  (CORTISPORIN ) 3.5-10000-1 OTIC suspension Place 3 drops into both ears 3 (three) times daily for 7 days 04/25/23     norethindrone (MICRONOR) 0.35 MG tablet Take 1 tablet by mouth daily. 07/19/21   [provider]  ondansetron  (ZOFRAN -ODT) 4 MG disintegrating tablet Take 1 tablet (4 mg total) by mouth every 8 (eight) hours as needed. 02/24/23   Leath-Warren, Etta PARAS, NP  SUMAtriptan (IMITREX) 100 MG tablet Take by mouth. 10/03/22   [provider]  triamcinolone  ointment (KENALOG ) 0.1 % PLEASE SEE ATTACHED FOR DETAILED DIRECTIONS    [provider]  venlafaxine (EFFEXOR) 37.5 MG tablet Take 37.5 mg by mouth in the morning and at bedtime. START WITH 1 TABLET DAILY FOR ONE WEEK, THEN INCREASE TO 1 TABLET TWICE PER DAY FOR HEADACHES. 12/24/22   [provider]  Vitamin D , Ergocalciferol , (DRISDOL ) 1.25 MG (50000 UNIT) CAPS capsule Take 1 capsule (50,000 Units total) by mouth every 7 (seven) days. Patient not taking: Reported on 12/21/2023 12/12/22   Wendee Lynwood HERO, NP    Allergies: Iodine, Other, Shellfish allergy, Tape, Bactrim  [sulfamethoxazole -trimethoprim ], Ginger, Morphine  and codeine , Sulfa  antibiotics, and Vancomycin     Review of Systems  Constitutional:  Positive for fever (Low-grade).  HENT:  Positive for congestion, ear discharge, ear pain, hearing loss, rhinorrhea, sinus pressure and sinus pain.   Respiratory:  Positive for cough.   Gastrointestinal:  Negative for nausea and vomiting.  Musculoskeletal:  Positive for neck pain.    Updated Vital Signs BP 118/82 (BP Location: Right Arm)   Pulse (!) 108   Temp 99.8 F (37.7 C)   Resp 18  LMP 12/14/2023 (Approximate)   SpO2 99%   Physical Exam Vitals and nursing note reviewed.  Constitutional:      Appearance: Normal appearance. She is not ill-appearing.  HENT:     Head: Normocephalic and atraumatic.     Right Ear: Tympanic membrane, ear canal and external ear normal.     Left Ear: Tympanic membrane normal. Tenderness present. No foreign body. Tympanic membrane is not perforated, retracted or bulging.     Ears:     Comments: Right ear: Erythematous external ear with active drainage from ear canal. Erythematous ear canal. Pain with palpation of external ear and surrounding ear.   No swelling over the mastoid. Tender occipital lymph node on left side.    Nose: Congestion and rhinorrhea present.     Mouth/Throat:     Mouth: Mucous membranes are moist.  Eyes:     Extraocular Movements: Extraocular movements intact.  Cardiovascular:     Rate and  Rhythm: Normal rate and regular rhythm.     Pulses: Normal pulses.  Pulmonary:     Effort: Pulmonary effort is normal.     Breath sounds: Normal breath sounds.  Musculoskeletal:     Cervical back: Normal range of motion. Tenderness present. No rigidity.  Skin:    General: Skin is warm and dry.     Coloration: Skin is not pale.  Neurological:     Mental Status: She is alert and oriented to person, place, and time.     (all labs ordered are listed, but only abnormal results are displayed) Labs Reviewed - No data to display  EKG: None  Radiology: No results found.   Procedures   Medications Ordered in the ED - No data to display    Patient presents to the ED for concern of otalgia, this involves an extensive number of treatment options, and is a complaint that carries with it a high risk of complications and morbidity.  The differential diagnosis includes otitis media, otitis externa, mastoiditis, traumatic injury, or perforation of tympanic membrane.   Medicines ordered and prescription drug management:  I ordered medication including Norco  for pain management.  Reevaluation of the patient after these medicines showed that the patient improved I have reviewed the patients home medicines and have made adjustments as needed   Problem List / ED Course:  Otalgia: Ear exam. Pain management. Antibiotic course.   Reevaluation:  After the interventions noted above, I reevaluated the patient and found that they have :improved   Dispostion:  After consideration of the diagnostic results and the patients response to treatment, I feel that the patent would benefit from antibiotic ear drops. She will continue with scheduled appointment with ENT next week.                                    Medical Decision Making Risk Prescription drug management.        Final diagnoses:  None    ED Discharge Orders     None          Rosina Almarie DELENA DEVONNA 01/01/24 2105    Yolande Lamar BROCKS, MD 01/03/24 1910

## 2024-01-09 DIAGNOSIS — L732 Hidradenitis suppurativa: Principal | ICD-10-CM

## 2024-01-20 ENCOUNTER — Encounter: Admit: 2024-01-20 | Discharge: 2024-01-21 | Payer: BLUE CROSS/BLUE SHIELD

## 2024-01-20 DIAGNOSIS — L732 Hidradenitis suppurativa: Secondary | ICD-10-CM | POA: Diagnosis not present

## 2024-01-20 LAB — CBC W/ AUTO DIFF
BASOPHILS ABSOLUTE COUNT: 0.1 10*9/L (ref 0.0–0.1)
BASOPHILS RELATIVE PERCENT: 1.5 %
EOSINOPHILS ABSOLUTE COUNT: 0.4 10*9/L (ref 0.0–0.5)
EOSINOPHILS RELATIVE PERCENT: 4.3 %
HEMATOCRIT: 37.4 % (ref 34.0–44.0)
HEMOGLOBIN: 12.8 g/dL (ref 11.3–14.9)
LYMPHOCYTES ABSOLUTE COUNT: 3.3 10*9/L (ref 1.1–3.6)
LYMPHOCYTES RELATIVE PERCENT: 38.9 %
MEAN CORPUSCULAR HEMOGLOBIN CONC: 34.1 g/dL (ref 32.0–36.0)
MEAN CORPUSCULAR HEMOGLOBIN: 29.7 pg (ref 25.9–32.4)
MEAN CORPUSCULAR VOLUME: 87.2 fL (ref 77.6–95.7)
MEAN PLATELET VOLUME: 8.1 fL (ref 6.8–10.7)
MONOCYTES ABSOLUTE COUNT: 0.7 10*9/L (ref 0.3–0.8)
MONOCYTES RELATIVE PERCENT: 7.8 %
NEUTROPHILS ABSOLUTE COUNT: 4 10*9/L (ref 1.8–7.8)
NEUTROPHILS RELATIVE PERCENT: 47.5 %
PLATELET COUNT: 352 10*9/L (ref 150–450)
RED BLOOD CELL COUNT: 4.29 10*12/L (ref 3.95–5.13)
RED CELL DISTRIBUTION WIDTH: 13.7 % (ref 12.2–15.2)
WBC ADJUSTED: 8.5 10*9/L (ref 3.6–11.2)

## 2024-01-20 LAB — SEDIMENTATION RATE: ERYTHROCYTE SEDIMENTATION RATE: 126 mm/h — ABNORMAL HIGH (ref 0–20)

## 2024-01-20 LAB — ALT: ALT (SGPT): 11 U/L (ref 10–49)

## 2024-01-20 LAB — C-REACTIVE PROTEIN: C-REACTIVE PROTEIN: 24.5 mg/L — ABNORMAL HIGH (ref <=10.0)

## 2024-01-20 LAB — AST: AST (SGOT): 14 U/L (ref <=34)

## 2024-01-20 LAB — BUN: BLOOD UREA NITROGEN: 7 mg/dL — ABNORMAL LOW (ref 9–23)

## 2024-01-20 LAB — CREATININE
CREATININE: 0.63 mg/dL (ref 0.55–1.02)
EGFR CKD-EPI (2021) FEMALE: >90 mL/min/1.73m2 (ref >=60)

## 2024-01-20 MED ADMIN — cetirizine (ZYRTEC) tablet 10 mg: 10 mg | ORAL | @ 15:00:00 | Stop: 2024-01-20

## 2024-01-20 MED ADMIN — acetaminophen (TYLENOL) tablet 650 mg: 650 mg | ORAL | @ 15:00:00 | Stop: 2024-01-20

## 2024-01-20 MED ADMIN — ondansetron (ZOFRAN) injection 8 mg: 8 mg | INTRAVENOUS | @ 16:00:00 | Stop: 2024-01-20

## 2024-01-20 MED ADMIN — diphenhydrAMINE (BENADRYL) capsule/tablet 25 mg: 25 mg | ORAL | @ 15:00:00 | Stop: 2024-01-20

## 2024-01-20 MED ADMIN — inFLIXimab-axxq (AVSOLA) 10 mg/kg = 700 mg in sodium chloride (NS) 250 mL IVPB: 10 mg/kg | INTRAVENOUS | @ 16:00:00 | Stop: 2024-01-20

## 2024-01-20 NOTE — Progress Notes (Signed)
 Patient presents for Avsola  (infliximab -axxq) infusion.  VSS, in no acute distress. Reports no new medical issues or S/S of infection; allergies, medications, and health history reviewed and up to date.  PIV placed without complication, labs drawn.  See MAR for premeds.    1047: Avsola  (infliximab -axxq) 700 mg infusing as follows:     10ml/hr x 15 min  17ml/hr x 15 min  77ml/hr x 15 min  6ml/hr x 15 min  131ml/hr x 30 min  275ml/hr for the remainder of the infusion    1250: Avsola  (infliximab -axxq) infusion complete.  PIV flushed with NS and removed, coban and gauze applied.  Vitals stable.  Patient remains without any s/s of adverse reaction.  Patient discharged from [facility] in stable condition.  Has future appts through January 2026.

## 2024-01-24 NOTE — Progress Notes (Signed)
 [facility] Specialty and Home Delivery Pharmacy Refill Coordination Note    Melissa Pearson, [place]: 11/04/1990  Phone: [phone] (home) [phone] (work)      All above HIPAA information was verified with patient.         01/23/2024    11:32 AM   Specialty Rx Medication Refill Questionnaire   Which Medications would you like refilled and shipped? BIMZELX    Please list all current allergies: Shellfish idodine   Have you missed any doses in the last 30 days? No   Have you had any changes to your medication(s) since your last refill? No   How much of each medication do you have remaining at home? (eg. number of tablets, injections, etc.) 0   If receiving an injectable medication, next injection date is 01/31/2024   Have you experienced any side effects in the last 30 days? No   Please enter the full address (street address, city, state, zip code) where you would like your medication(s) to be delivered to. [address], [place] [address]   Please specify on which day you would like your medication(s) to arrive. Note: if you need your medication(s) within 3 days, please call the pharmacy to schedule your order at [phone]  01/29/2024   Has your insurance changed since your last refill? No   Would you like a pharmacist to call you to discuss your medication(s)? No   Do you require a signature for your package? (Note: if we are billing Medicare Part B or your order contains a controlled substance, we will require a signature) No   I have been provided my out of pocket cost for my medication and approve the pharmacy to charge the amount to my credit card on file. Yes         Completed refill call assessment today to schedule patient's medication shipment from the [facility] [phone]).  All relevant notes have been reviewed.       Confirmed patient received a Conservation Officer, Historic Buildings and a Surveyor, Mining with first shipment. The patient will receive a drug information handout for each medication shipped and additional FDA Medication Guides as required.         REFERRAL TO PHARMACIST     Referral to the pharmacist: Not needed      [facility]     Shipping address confirmed in Epic.     Delivery Scheduled: Yes, Expected medication delivery date: 01/29/24.     Medication will be delivered via UPS to the temporary address in Epic WAM.    Melissa Pearson   [facility] Specialty and Home Delivery Pharmacy Specialty Technician

## 2024-02-01 ENCOUNTER — Other Ambulatory Visit (HOSPITAL_COMMUNITY): Payer: Self-pay

## 2024-02-01 ENCOUNTER — Emergency Department (HOSPITAL_BASED_OUTPATIENT_CLINIC_OR_DEPARTMENT_OTHER)
Admission: EM | Admit: 2024-02-01 | Discharge: 2024-02-01 | Disposition: A | Discharge status: Home / Self Care | Attending: Emergency Medicine | Admitting: Emergency Medicine

## 2024-02-01 ENCOUNTER — Encounter (HOSPITAL_BASED_OUTPATIENT_CLINIC_OR_DEPARTMENT_OTHER): Payer: Self-pay

## 2024-02-01 ENCOUNTER — Ambulatory Visit

## 2024-02-01 DIAGNOSIS — R0981 Nasal congestion: Secondary | ICD-10-CM | POA: Insufficient documentation

## 2024-02-01 DIAGNOSIS — H60312 Diffuse otitis externa, left ear: Secondary | ICD-10-CM | POA: Diagnosis not present

## 2024-02-01 DIAGNOSIS — Z79899 Other long term (current) drug therapy: Secondary | ICD-10-CM | POA: Insufficient documentation

## 2024-02-01 DIAGNOSIS — H9202 Otalgia, left ear: Secondary | ICD-10-CM | POA: Diagnosis not present

## 2024-02-01 MED ORDER — AMOXICILLIN-POT CLAVULANATE 875-125 MG PO TABS
1.0000 | ORAL_TABLET | Freq: Two times a day (BID) | ORAL | 0 refills | Status: DC
  Filled 2024-02-01: qty 13, 7d supply, fill #0

## 2024-02-01 MED ORDER — AMOXICILLIN-POT CLAVULANATE 875-125 MG PO TABS
1.0000 | ORAL_TABLET | Freq: Once | ORAL | Status: AC
  Administered 2024-02-01: 1 via ORAL
  Filled 2024-02-01: qty 1

## 2024-02-01 MED ORDER — IBUPROFEN 400 MG PO TABS
600.0000 mg | ORAL_TABLET | Freq: Once | ORAL | Status: AC
  Administered 2024-02-01: 600 mg via ORAL
  Filled 2024-02-01: qty 1

## 2024-02-01 MED ORDER — FLUTICASONE PROPIONATE 50 MCG/ACT NA SUSP
2.0000 | Freq: Every day | NASAL | Status: DC
  Administered 2024-02-01: 2 via NASAL
  Filled 2024-02-01: qty 16

## 2024-02-01 MED ORDER — CIPROFLOXACIN-DEXAMETHASONE 0.3-0.1 % OT SUSP
3.0000 [drp] | Freq: Two times a day (BID) | OTIC | Status: DC
  Administered 2024-02-01: 3 [drp] via OTIC
  Filled 2024-02-01: qty 7.5

## 2024-02-01 NOTE — ED Triage Notes (Signed)
 She reports left earache which began this morning. She cites rebuilt eardrum, and this happens sometimes. She states the last occurrence of this was ~ 1 month ago. She denis fever/cough and is in no distress.

## 2024-02-01 NOTE — ED Provider Notes (Signed)
 [place] EMERGENCY DEPARTMENT AT [facility] Provider Note   CSN: [id number] Arrival date & time: 02/01/24  9149     Patient presents with: No chief complaint on file.   Courtney [name] is a 33 y.o. female with reported reconstruction surgery on her ears previously, presents with concern for left ear pain that started this morning.  Reports some mild clear drainage that is also coming out of the left ear.  She denies any pain in her right ear.  Reports congestion that has been going on for about the past week.  Denies any fever, chills, cough, sore throat.  She reports she frequently gets ear infections and has previously had surgery on her ears.   HPI     Prior to Admission medications   Medication Sig Start Date End Date Taking? Authorizing Provider  amoxicillin -clavulanate (AUGMENTIN ) 875-125 MG tablet Take 1 tablet by mouth 2 (two) times daily for 7 days. 02/01/24 02/08/24 Yes Veta Palma, PA-C  albuterol  (VENTOLIN  HFA) 108 (90 Base) MCG/ACT inhaler TAKE 2 PUFFS BY MOUTH EVERY 6 HOURS AS NEEDED FOR WHEEZE OR SHORTNESS OF BREATH 07/05/22   Wendee Lynwood HERO, NP  bifidobacterium infantis (ALIGN) capsule Take 1 capsule by mouth daily. Patient not taking: Reported on 12/21/2023    [provider]  BIMZELX 160 MG/ML pen Inject into the skin. 05/15/22   [provider]  buprenorphine -naloxone (SUBOXONE) 2-0.5 mg SUBL SL tablet Place 1 tablet under the tongue. 8-2 mg    [provider]  chlorhexidine  (HIBICLENS ) 4 % external liquid Apply a dime size amount of the solution to warm water and cleanse the affected areas twice daily until symptoms improve. 12/21/23   Leath-Warren, Etta PARAS, NP  chlorhexidine  (PERIDEX ) 0.12 % solution Use as directed 15 mLs in the mouth or throat 2 (two) times daily. Patient not taking: Reported on 12/21/2023 05/29/23   Stuart Vernell Norris, PA-C  diphenhydrAMINE  (BENADRYL ) 25 mg capsule 25 mg every 6 (six) hours as  needed.    [provider]  EPINEPHrine  (EPIPEN  2-PAK) 0.3 mg/0.3 mL IJ SOAJ injection Inject 0.3 mg into the muscle as needed for anaphylaxis. 11/01/22   Wendee Lynwood HERO, NP  glucose blood (CONTOUR TEST) test strip And lancets #100 05/18/21   Kassie Mallick, MD  HYDROcodone -acetaminophen  (NORCO/VICODIN) 5-325 MG tablet Take 1 tablet by mouth every 4 (four) hours as needed for moderate pain (pain score 4-6). Patient not taking: Reported on 12/21/2023 08/06/23   Kathlynn Sharper, MD  hydrOXYzine  (ATARAX ) 25 MG tablet TAKE 1 TABLET BY MOUTH EVERY 8 HOURS AS NEEDED FOR ITCHING. Patient not taking: Reported on 07/30/2023 01/16/22   Douglass Caul B, FNP  inFLIXimab  (REMICADE  IV) Inject 1 Dose into the vein every 30 (thirty) days.    [provider]  inFLIXimab  (REMICADE ) 100 MG injection     [provider]  Iron-Vitamin C 100-250 MG TABS Take 1 tablet by mouth daily. Patient not taking: Reported on 12/21/2023    [provider]  lidocaine  (XYLOCAINE ) 2 % solution May soak a cotton swab and set it onto the area of pain every 3 hours as needed Patient not taking: Reported on 07/30/2023 05/29/23   Stuart Vernell Norris, PA-C  methylphenidate  (RITALIN ) 20 MG tablet Take 1 tablet (20 mg total) by mouth daily before breakfast. 01/03/23   Wendee Lynwood HERO, NP  Microlet Lancets MISC See admin instructions. 05/18/21   [provider]  mometasone  (ELOCON ) 0.1 % lotion Apply 1 Application topically daily.  [provider]  montelukast  (SINGULAIR ) 10 MG tablet TAKE 1 TABLET BY MOUTH EVERYDAY AT BEDTIME 05/16/22   Wendee Lynwood HERO, NP  mupirocin  ointment (BACTROBAN ) 2 % Apply 1 Application topically 2 (two) times daily. 12/21/23   Leath-Warren, Etta PARAS, NP  norethindrone (MICRONOR) 0.35 MG tablet Take 1 tablet by mouth daily. 07/19/21   [provider]  ondansetron  (ZOFRAN -ODT) 4 MG disintegrating tablet Take 1 tablet (4 mg total) by mouth every 8 (eight) hours as  needed. 02/24/23   Leath-Warren, Etta PARAS, NP  SUMAtriptan (IMITREX) 100 MG tablet Take by mouth. 10/03/22   [provider]  triamcinolone  ointment (KENALOG ) 0.1 % PLEASE SEE ATTACHED FOR DETAILED DIRECTIONS    [provider]  venlafaxine (EFFEXOR) 37.5 MG tablet Take 37.5 mg by mouth in the morning and at bedtime. START WITH 1 TABLET DAILY FOR ONE WEEK, THEN INCREASE TO 1 TABLET TWICE PER DAY FOR HEADACHES. 12/24/22   [provider]  Vitamin D , Ergocalciferol , (DRISDOL ) 1.25 MG (50000 UNIT) CAPS capsule Take 1 capsule (50,000 Units total) by mouth every 7 (seven) days. Patient not taking: Reported on 12/21/2023 12/12/22   Wendee Lynwood HERO, NP    Allergies: Iodine, Other, Shellfish allergy, Tape, Bactrim  [sulfamethoxazole -trimethoprim ], Ginger, Morphine  and codeine , Sulfa  antibiotics, and Vancomycin     Review of Systems  Constitutional:  Negative for fever.  HENT:  Positive for ear discharge and ear pain.     Updated Vital Signs BP 116/89 (BP Location: Left Arm)   Pulse 77   Temp 98.1 F (36.7 C) (Oral)   Resp 19   LMP  (LMP Unknown)   SpO2 100%   Physical Exam Vitals and nursing note reviewed.  Constitutional:      General: She is not in acute distress.    Appearance: She is well-developed.  HENT:     Head: Normocephalic and atraumatic.     Right Ear: Tympanic membrane and ear canal normal.     Ears:     Comments: Left external auditory canal appears swollen but not red, unable to visualize her left tympanic membrane.  Unclear if this is from her surgery or due to infection. Clear drainage coming out of the left ear.      Nose: Congestion present.     Mouth/Throat:     Mouth: Mucous membranes are moist.     Pharynx: No oropharyngeal exudate or posterior oropharyngeal erythema.     Comments: Swallowing without difficulty Eyes:     Conjunctiva/sclera: Conjunctivae normal.  Cardiovascular:     Rate and Rhythm: Normal rate and regular rhythm.      Heart sounds: No murmur heard. Pulmonary:     Effort: Pulmonary effort is normal. No respiratory distress.     Breath sounds: Normal breath sounds.  Abdominal:     Palpations: Abdomen is soft.     Tenderness: There is no abdominal tenderness.  Musculoskeletal:        General: No swelling.     Cervical back: Neck supple.  Skin:    General: Skin is warm and dry.     Capillary Refill: Capillary refill takes less than 2 seconds.  Neurological:     Mental Status: She is alert.  Psychiatric:        Mood and Affect: Mood normal.     (all labs ordered are listed, but only abnormal results are displayed) Labs Reviewed - No data to display  EKG: None  Radiology: No results found.   Procedures  Medications Ordered in the ED  ciprofloxacin -dexamethasone  (CIPRODEX ) 0.3-0.1 % OTIC (EAR) suspension 3 drop (has no administration in time range)  amoxicillin -clavulanate (AUGMENTIN ) 875-125 MG per tablet 1 tablet (has no administration in time range)  ibuprofen  (ADVIL ) tablet 600 mg (has no administration in time range)  fluticasone  (FLONASE ) 50 MCG/ACT nasal spray 2 spray (has no administration in time range)                                    Medical Decision Making Risk Prescription drug management.     Differential diagnosis includes but is not limited to otitis media, otitis externa, tympanic membrane perforation, eustachian tube dysfunction, URI  ED Course:  Upon initial evaluation, patient appears uncomfortable, but no acute distress.  Normal vital signs.  She is reporting left ear pain.  On exam, she has mild amount of clear drainage coming out of the left ear.  Her external auditory canal appears prepped swollen as I am unable to visualize her left tympanic membrane.  The left external auditory canal is not erythematous.  Given her report of pain, will treat for otitis media and otitis externa with course of Augmentin  and Ciprodex  drops.  The bottle of Ciprodex  drops was  given to patient here for her to use at home.  Her right external auditory canal and tympanic membrane are without erythema, edema, no signs of infection in the right ear. She does have significant congestion on exam.  Will give Flonase  to help with her congestion.  Stable and appropriate for discharge home  Medications Given: Ciprodex  otic drops Augmentin  Ibuprofen   Impression: Left otitis externa  Disposition:  The patient was discharged home with instructions to take the ciprodex  drops provided here and the Augmentin  as prescribed.  Tylenol  and ibuprofen  as seen for pain.  Follow-up with her PCP return ENT provider within the next week for recheck. Return precautions given and patient verbalized understanding.     This chart was dictated using voice recognition software, Dragon. Despite the best efforts of this provider to proofread and correct errors, errors may still occur which can change documentation meaning.       Final diagnoses:  Acute diffuse otitis externa of left ear    ED Discharge Orders          Ordered    amoxicillin -clavulanate (AUGMENTIN ) 875-125 MG tablet  2 times daily        02/01/24 1001               Veta Palma, PA-C 02/01/24 1003    Yolande Lamar BROCKS, MD 02/02/24 1722

## 2024-02-01 NOTE — ED Notes (Signed)

## 2024-02-01 NOTE — Discharge Instructions (Addendum)
 On your exam today, it appears that you have an outer ear infection in your left ear.  You have been prescribed Ciprodex  antibiotic drops for your left ear to treat this infection.  Please put 3 drops in your left ear twice daily for the next 7 days.  You were given your first dose here today.  You have been prescribed an antibiotic called Augmentin  to treat any potential inner ear infection as I cannot see your eardrum on exam today. Take this antibiotic 2 times a day for the next 7 days.  You were given your first dose here today.  Take your next dose this evening.  Take the full course of your antibiotic even if you start feeling better. Antibiotics may cause you to have diarrhea.  I recommend using 2 puffs of Flonase  (fluticasone ) in each nostril daily to help with your nasal congestion.  This can also help with your ear pain.   You may use up to 600mg  ibuprofen  every 6 hours as needed for pain.  Do not exceed 2.4g of ibuprofen  per day.  You may take up to 1000mg  of tylenol  every 6 hours as needed for pain.  Do not take more then 4g per day.  Please schedule follow-up appointment with your PCP or ENT provider within the next week for recheck of your symptoms  Please return to the ER for any other new or concerning symptoms

## 2024-02-02 ENCOUNTER — Ambulatory Visit (HOSPITAL_COMMUNITY)
Admission: EM | Admit: 2024-02-02 | Discharge: 2024-02-03 | Disposition: A | Attending: Psychiatry | Admitting: Psychiatry

## 2024-02-02 DIAGNOSIS — R45851 Suicidal ideations: Secondary | ICD-10-CM | POA: Insufficient documentation

## 2024-02-02 DIAGNOSIS — F1990 Other psychoactive substance use, unspecified, uncomplicated: Secondary | ICD-10-CM

## 2024-02-02 DIAGNOSIS — Z91148 Patient's other noncompliance with medication regimen for other reason: Secondary | ICD-10-CM | POA: Diagnosis not present

## 2024-02-02 DIAGNOSIS — Z79899 Other long term (current) drug therapy: Secondary | ICD-10-CM | POA: Diagnosis not present

## 2024-02-02 DIAGNOSIS — F909 Attention-deficit hyperactivity disorder, unspecified type: Secondary | ICD-10-CM | POA: Diagnosis not present

## 2024-02-02 DIAGNOSIS — R630 Anorexia: Secondary | ICD-10-CM | POA: Diagnosis not present

## 2024-02-02 DIAGNOSIS — F3181 Bipolar II disorder: Secondary | ICD-10-CM | POA: Diagnosis not present

## 2024-02-02 DIAGNOSIS — Z86711 Personal history of pulmonary embolism: Secondary | ICD-10-CM | POA: Insufficient documentation

## 2024-02-02 DIAGNOSIS — G4700 Insomnia, unspecified: Secondary | ICD-10-CM | POA: Diagnosis not present

## 2024-02-02 LAB — CBC WITH DIFFERENTIAL/PLATELET
Abs Immature Granulocytes: 0.04 K/uL (ref 0.00–0.07)
Basophils Absolute: 0.1 K/uL (ref 0.0–0.1)
Basophils Relative: 1 %
Eosinophils Absolute: 0.3 K/uL (ref 0.0–0.5)
Eosinophils Relative: 2 %
HCT: 42.3 % (ref 36.0–46.0)
Hemoglobin: 13.4 g/dL (ref 12.0–15.0)
Immature Granulocytes: 0 %
Lymphocytes Relative: 36 %
Lymphs Abs: 4.9 K/uL — ABNORMAL HIGH (ref 0.7–4.0)
MCH: 29.2 pg (ref 26.0–34.0)
MCHC: 31.7 g/dL (ref 30.0–36.0)
MCV: 92.2 fL (ref 80.0–100.0)
Monocytes Absolute: 0.6 K/uL (ref 0.1–1.0)
Monocytes Relative: 5 %
Neutro Abs: 7.9 K/uL — ABNORMAL HIGH (ref 1.7–7.7)
Neutrophils Relative %: 56 %
Platelets: 298 K/uL (ref 150–400)
RBC: 4.59 MIL/uL (ref 3.87–5.11)
RDW: 13.4 % (ref 11.5–15.5)
WBC: 13.8 K/uL — ABNORMAL HIGH (ref 4.0–10.5)
nRBC: 0 % (ref 0.0–0.2)

## 2024-02-02 LAB — URINALYSIS, MICROSCOPIC (REFLEX)

## 2024-02-02 LAB — URINALYSIS, ROUTINE W REFLEX MICROSCOPIC
Bilirubin Urine: NEGATIVE
Glucose, UA: NEGATIVE mg/dL
Ketones, ur: 15 mg/dL — AB
Nitrite: POSITIVE — AB
Protein, ur: 30 mg/dL — AB
Specific Gravity, Urine: >1.03 — ABNORMAL HIGH (ref 1.005–1.030)
pH: 6 (ref 5.0–8.0)

## 2024-02-02 LAB — POCT URINE DRUG SCREEN - MANUAL ENTRY (I-SCREEN)
POC Amphetamine UR: POSITIVE — AB
POC Buprenorphine (BUP): NOT DETECTED
POC Cocaine UR: NOT DETECTED
POC Marijuana UR: NOT DETECTED
POC Methadone UR: NOT DETECTED
POC Methamphetamine UR: NOT DETECTED
POC Morphine: NOT DETECTED
POC Oxazepam (BZO): NOT DETECTED
POC Oxycodone UR: NOT DETECTED
POC Secobarbital (BAR): POSITIVE — AB

## 2024-02-02 LAB — TSH: TSH: 1.02 u[IU]/mL (ref 0.350–4.500)

## 2024-02-02 LAB — COMPREHENSIVE METABOLIC PANEL WITH GFR
ALT: 18 U/L (ref 0–44)
AST: 19 U/L (ref 15–41)
Albumin: 3 g/dL — ABNORMAL LOW (ref 3.5–5.0)
Alkaline Phosphatase: 94 U/L (ref 38–126)
Anion gap: 7 (ref 5–15)
BUN: 6 mg/dL (ref 6–20)
CO2: 26 mmol/L (ref 22–32)
Calcium: 9 mg/dL (ref 8.9–10.3)
Chloride: 103 mmol/L (ref 98–111)
Creatinine, Ser: 0.72 mg/dL (ref 0.44–1.00)
GFR, Estimated: >60 mL/min (ref >60)
Glucose, Bld: 87 mg/dL (ref 70–99)
Potassium: 3.8 mmol/L (ref 3.5–5.1)
Sodium: 136 mmol/L (ref 135–145)
Total Bilirubin: 0.3 mg/dL (ref 0.0–1.2)
Total Protein: 9.2 g/dL — ABNORMAL HIGH (ref 6.5–8.1)

## 2024-02-02 LAB — MAGNESIUM: Magnesium: 1.9 mg/dL (ref 1.7–2.4)

## 2024-02-02 LAB — ETHANOL: Alcohol, Ethyl (B): <15 mg/dL (ref <15)

## 2024-02-02 LAB — LIPID PANEL
Cholesterol: 143 mg/dL (ref 0–200)
HDL: 43 mg/dL (ref >40)
LDL Cholesterol: 80 mg/dL (ref 0–99)
Total CHOL/HDL Ratio: 3.3 ratio
Triglycerides: 102 mg/dL (ref <150)
VLDL: 20 mg/dL (ref 0–40)

## 2024-02-02 LAB — POCT PREGNANCY, URINE: Preg Test, Ur: NEGATIVE

## 2024-02-02 MED ORDER — ACETAMINOPHEN 325 MG PO TABS
650.0000 mg | ORAL_TABLET | Freq: Four times a day (QID) | ORAL | Status: DC | PRN
  Administered 2024-02-02: 650 mg via ORAL
  Filled 2024-02-02: qty 2

## 2024-02-02 MED ORDER — TRAZODONE HCL 50 MG PO TABS
50.0000 mg | ORAL_TABLET | Freq: Every evening | ORAL | Status: DC | PRN
  Administered 2024-02-02: 50 mg via ORAL
  Filled 2024-02-02: qty 1

## 2024-02-02 MED ORDER — HALOPERIDOL LACTATE 5 MG/ML IJ SOLN: 5.0000 mg | Freq: Three times a day (TID) | INTRAMUSCULAR | Status: DC | PRN

## 2024-02-02 MED ORDER — DIPHENHYDRAMINE HCL 50 MG/ML IJ SOLN: 50.0000 mg | Freq: Three times a day (TID) | INTRAMUSCULAR | Status: DC | PRN

## 2024-02-02 MED ORDER — HYDROXYZINE HCL 25 MG PO TABS
25.0000 mg | ORAL_TABLET | Freq: Three times a day (TID) | ORAL | Status: DC | PRN
  Administered 2024-02-02: 25 mg via ORAL
  Filled 2024-02-02: qty 1

## 2024-02-02 MED ORDER — HALOPERIDOL LACTATE 5 MG/ML IJ SOLN: 10.0000 mg | Freq: Three times a day (TID) | INTRAMUSCULAR | Status: DC | PRN

## 2024-02-02 MED ORDER — LORAZEPAM 2 MG/ML IJ SOLN: 2.0000 mg | Freq: Three times a day (TID) | INTRAMUSCULAR | Status: DC | PRN

## 2024-02-02 MED ORDER — HALOPERIDOL 5 MG PO TABS: 5.0000 mg | ORAL_TABLET | Freq: Three times a day (TID) | ORAL | Status: DC | PRN

## 2024-02-02 MED ORDER — MAGNESIUM HYDROXIDE 400 MG/5ML PO SUSP: 30.0000 mL | Freq: Every day | ORAL | Status: DC | PRN

## 2024-02-02 MED ORDER — DIPHENHYDRAMINE HCL 50 MG PO CAPS: 50.0000 mg | ORAL_CAPSULE | Freq: Three times a day (TID) | ORAL | Status: DC | PRN

## 2024-02-02 MED ORDER — ALUM & MAG HYDROXIDE-SIMETH 200-200-20 MG/5ML PO SUSP: 30.0000 mL | ORAL | Status: DC | PRN

## 2024-02-02 NOTE — ED Notes (Addendum)
 Patient arrived to the observation unit tearful with depressed, somewhat irritable mood at times.  Patient has been cooperative with staff other wise.  No acute distress noted.  Patient denies SI/HI/AVH at this time.  Patient offered food and drink at this time.  PRN Tylenol  650mg  for left ear pain, and patient states having a long-term ear infection is HOH in the left ear.  Patient was compliant with PRN Trazodone  50mg  as needed for sleep. Patient will remain on continuous observation at this time with  15 min safety checks per facility protocol.  No further issues noted or concerns voiced at this time.

## 2024-02-02 NOTE — Progress Notes (Signed)
 Patient is currently resting with eyes closed and no acute distress noted.  Respirations present, even and unlabored.  No behaviors noted or concerns voiced.  Will continue Q 15 min safety checks per facility protocol.
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
## 2024-02-02 NOTE — BH Assessment (Addendum)
 Comprehensive Clinical Assessment (CCA) Note  02/02/2024 Courtney [name] [id number]  Chief Complaint:  Chief Complaint  Patient presents with   Suicidal  Disposition: Per Starlyn Randall PIETY patient is recommended for inpatient admission.  The patient demonstrates the following risk factors for suicide: Chronic risk factors for suicide include: psychiatric disorder of Bipolar disorder, ADHD. Acute risk factors for suicide include: family or marital conflict. Protective factors for this patient include: hope for the future. Considering these factors, the overall suicide risk at this point appears to be moderate. Patient is not appropriate for outpatient follow up.   Courtney [name] is a 33 year old female with a history of bipolar disorder who presents voluntarily to [facility] for an assessment. Patient resides in the home with her mother.Patient reports  crying spells, irritability and lack of concentration but denies any other depression symptoms. She reports getting into an argument with her mother today that lead to suicidal ideations. Patient reports history of past suicide attempts, last occurrence was a few years ago.  Patient has a hx of Substance Abuse:Meth and cocaine. Last use was about 1 year ago. She denies current substance use.Patient denies NSSIB,  HI, AVH.  Patient identifies her primary stressors as ongoing issues with her mother and her boyfriend is currently in prison for a parole violation. Patient denies history of abuse or trauma. Patient denies current legal problems. Patient is not receiving outpatient therapy or psychiatry services. Patient reports she has been without medication for about 1-2 years.  Patient denies access to weapons.Patient states she does not want to return to her mothers house but would like to go to her father [place]el[place] house, [phone], and gives permission to speak with him if needed.   During evaluation patient is in no acute  distress. She is alert, oriented x 4, anxious but cooperative and attentive. Her mood is anxious and depressed with congruent affect. She has normal speech, but  is tearful during the assessment.  Objectively there is no evidence of psychosis/mania or delusional thinking.  Patient is able to converse coherently, goal directed thoughts, no distractibility, or pre-occupation. Patient answered question appropriately.      Visit Diagnosis:  Family discord  Suicidal Ideation Bipolar II disorder   CCA Screening, Triage and Referral (STR)  Patient Reported Information How did you hear about us ? Legal System  What Is the Reason for Your Visit/Call Today? Per triage note Courtney [name] presents to [facility] voluntarily via [employer]. Pt states she was in an altercation with her mother this evening which led to her coming here. Pt most recently endorsed SI today with no plan. Pt is diagnosed with Bipolar 2, not taking medication at this time. Hx of substance use, not with in the last year. Pt is seeking outpatient therapy and medication. Pt denies HI, AVH, Alcohol and drug use.  How Long Has This Been Causing You Problems? <Week  What Do You Feel Would Help You the Most Today? Treatment for Depression or other mood problem; Medication(s)   Have You Recently Had Any Thoughts About Hurting Yourself? Yes  Are You Planning to Commit Suicide/Harm Yourself At This time? No   Flowsheet Row ED from 02/02/2024 in [facility] ED from 02/01/2024 in [facility] Emergency Department at [facility] ED from 01/01/2024 in [facility] Emergency Department at [facility]  C-SSRS RISK CATEGORY Moderate Risk No Risk No Risk    Have you Recently Had Thoughts About Hurting Someone [name]? No  Are  You Planning to Harm Someone at This Time? No  Explanation: pt denies   Have You Used Any Alcohol or Drugs in the Past 24 Hours? No  How Long Ago Did You Use Drugs or Alcohol? N/a What Did  You Use and How Much? N/a  Do You Currently Have a Therapist/Psychiatrist? No  Name of Therapist/Psychiatrist:    Have You Been Recently Discharged From Any Office Practice or Programs? No  Explanation of Discharge From Practice/Program: n/a    CCA Screening Triage Referral Assessment Type of Contact: Face-to-Face  Telemedicine Service Delivery:   Is this Initial or Reassessment?   Date Telepsych consult ordered in [facility]:    Time Telepsych consult ordered in [facility]:    Location of Assessment: [facility] Assessment Services  Provider Location: [facility] Assessment Services   Collateral Involvement: n/a   Does Patient Have a Automotive Engineer Guardian? No  Legal Guardian Contact Information: n/a  Copy of Legal Guardianship Form: -- (n/a)  Legal Guardian Notified of Arrival: -- (n/a)  Legal Guardian Notified of Pending Discharge: -- (n/a)  If Minor and Not Living with Parent(s), Who has Custody? n/a  Is CPS involved or ever been involved? Never  Is APS involved or ever been involved? Never   Patient Determined To Be At Risk for Harm To Self or Others Based on Review of Patient Reported Information or Presenting Complaint? Yes, for Self-Harm  Method: No Plan  Availability of Means: No access or NA  Intent: Vague intent or NA  Notification Required: No need or identified person  Additional Information for Danger to Others Potential: -- (n/a)  Additional Comments for Danger to Others Potential: n/a  Are There Guns or Other Weapons in Your Home? No  Types of Guns/Weapons: n/a  Are These Weapons Safely Secured?                            -- (n/a)  Who Could Verify You Are Able To Have These Secured: n/a  Do You Have any Outstanding Charges, Pending Court Dates, Parole/Probation? pt denies  Contacted To Inform of Risk of Harm To Self or Others: Law Enforcement    Does Patient Present under Involuntary Commitment? No    [place] of Residence:  [place]   Patient Currently Receiving the Following Services: Not Receiving Services   Determination of Need: Urgent (48 hours)   Options For Referral: Medication Management; Outpatient Therapy; BH Urgent Care     CCA Biopsychosocial Patient Reported Schizophrenia/Schizoaffective Diagnosis in Past: No   Strengths: Cooperation in assessment   Mental Health Symptoms Depression:  Change in energy/activity; Difficulty Concentrating; Irritability; Tearfulness   Duration of Depressive symptoms: Duration of Depressive Symptoms: Greater than two weeks   Mania:  N/A   Anxiety:   Worrying; Tension   Psychosis:  None   Duration of Psychotic symptoms:    Trauma:  N/A   Obsessions:  N/A   Compulsions:  N/A   Inattention:  N/A   Hyperactivity/Impulsivity:  N/A   Oppositional/Defiant Behaviors:  N/A   Emotional Irregularity:  N/A   Other Mood/Personality Symptoms:  n/a    Mental Status Exam Appearance and self-care  Stature:  Average   Weight:  Average weight   Clothing:  Casual   Grooming:  Neglected   Cosmetic use:  None   Posture/gait:  Slumped   Motor activity:  Not Remarkable   Sensorium  Attention:  Normal   Concentration:  Normal   Orientation:  X5   Recall/memory:  Normal   Affect and Mood  Affect:  Anxious; Depressed; Tearful   Mood:  Anxious; Depressed   Relating  Eye contact:  Normal   Facial expression:  Depressed; Sad   Attitude toward examiner:  Cooperative   Thought and Language  Speech flow: Clear and Coherent   Thought content:  Appropriate to Mood and Circumstances   Preoccupation:  None   Hallucinations:  None   Organization:  Coherent   [employer] of Knowledge:  Average   Intelligence:  Average   Abstraction:  Normal   Judgement:  Impaired   Reality Testing:  Realistic   Insight:  Fair   Decision Making:  Impulsive   Social Functioning  Social Maturity:  Impulsive   Social  Judgement:  Normal   Stress  Stressors:  Family conflict   Coping Ability:  Overwhelmed   Skill Deficits:  Communication   Supports:  Family; Support needed     Religion: Religion/Spirituality Are You A Religious Person?: No How Might This Affect Treatment?: n/a  Leisure/Recreation: Leisure / Recreation Do You Have Hobbies?: No  Exercise/Diet: Exercise/Diet Do You Exercise?: No Have You Gained or Lost A Significant Amount of Weight in the Past Six Months?: No Do You Follow a Special Diet?: No Do You Have Any Trouble Sleeping?: No   CCA Employment/Education Employment/Work Situation: Employment / Work Situation Employment Situation: Unemployed Patient's Job has Been Impacted by Current Illness: No Has Patient ever Been in Equities Trader?: No  Education: Education Is Patient Currently Attending School?: No Last Grade Completed: 12 Did You Product Manager?: Yes What Type of College Degree Do you Have?: some college Did You Have An Individualized Education Program (IIEP): No Did You Have Any Difficulty At School?: No Patient's Education Has Been Impacted by Current Illness: No   CCA Family/Childhood History Family and Relationship History: Family history Marital status: Single Does patient have children?: No  Childhood History:  Childhood History By whom was/is the patient raised?: Mother Did patient suffer any verbal/emotional/physical/sexual abuse as a child?: No Did patient suffer from severe childhood neglect?: No Has patient ever been sexually abused/assaulted/raped as an adolescent or adult?: No Was the patient ever a victim of a crime or a disaster?: No Witnessed domestic violence?: No Has patient been affected by domestic violence as an adult?: No       CCA Substance Use Alcohol/Drug Use: Alcohol / Drug Use Pain Medications: n/a Prescriptions: n/a Over the Counter: n/a History of alcohol / drug use?: No history of alcohol / drug abuse                          ASAM's:  Six Dimensions of Multidimensional Assessment  Dimension 1:  Acute Intoxication and/or Withdrawal Potential:      Dimension 2:  Biomedical Conditions and Complications:      Dimension 3:  Emotional, Behavioral, or Cognitive Conditions and Complications:     Dimension 4:  Readiness to Change:     Dimension 5:  Relapse, Continued use, or Continued Problem Potential:     Dimension 6:  Recovery/Living Environment:     ASAM Severity Score:    ASAM Recommended Level of Treatment:     Substance use Disorder (SUD)    Recommendations for Services/Supports/Treatments:    Disposition Recommendation per psychiatric provider: Inpatient admission   DSM5 Diagnoses: Patient Active Problem List   Diagnosis Date Noted  Polypharmacy 01/17/2023   Abnormal urinalysis 01/17/2023   History of substance abuse (HCC) 01/17/2023   Hospital discharge follow-up 01/17/2023   Acute cystitis 01/02/2023   Left ureteral stone 01/02/2023   CAP (community acquired pneumonia) 01/02/2023   Polysubstance abuse (HCC) 01/02/2023   Methamphetamine use 11/01/2022   Otitis externa of both ears 05/16/2022   Chronic rhinitis 02/15/2022   Shellfish allergy 02/15/2022   Pulmonary embolism (HCC) 07/19/2021   Hypotension 07/11/2021   Trichomonas vaginitis 07/05/2021   Acute metabolic encephalopathy 07/05/2021   Bilateral pulmonary embolism (HCC) 07/05/2021   Anxiety disorder 06/28/2021   Prediabetes 05/25/2021   Allergy to adhesive tape 05/25/2021   Hidradenitis suppurativa 05/23/2021   Insomnia 03/27/2021   Anemia 12/21/2020   Hypoglycemia 11/17/2020   Bipolar disorder, in full remission, most recent episode mixed 10/07/2020   Bipolar disorder, in full remission, most recent episode depressed 03/03/2020   Bipolar 2 disorder, major depressive episode (HCC) 08/04/2019   At risk for long QT syndrome 08/04/2019   Obesity (BMI 35.0-39.9 without comorbidity) 03/19/2019    Reactive airway disease 02/11/2019   Conductive hearing loss 03/13/2018   Axillary hidradenitis suppurativa 04/03/2017   Preventative health care 12/06/2016   Vitamin D  deficiency 10/18/2014   Lethargy 07/21/2014   Attention deficit hyperactivity disorder (ADHD), predominantly hyperactive type 02/11/2012     Referrals to Alternative Service(s): Referred to Alternative Service(s):   Place:   Date:   Time:    Referred to Alternative Service(s):   Place:   Date:   Time:    Referred to Alternative Service(s):   Place:   Date:   Time:    Referred to Alternative Service(s):   Place:   Date:   Time:     Annamary Buschman C Basilio Meadow, LCMHCA

## 2024-02-03 ENCOUNTER — Encounter (HOSPITAL_COMMUNITY): Payer: Self-pay

## 2024-02-03 ENCOUNTER — Other Ambulatory Visit: Payer: Self-pay

## 2024-02-03 ENCOUNTER — Inpatient Hospital Stay (HOSPITAL_COMMUNITY): Admission: AD | Admit: 2024-02-03 | Discharge: 2024-02-09 | DRG: 885 | Disposition: A | Source: Intra-hospital

## 2024-02-03 DIAGNOSIS — L732 Hidradenitis suppurativa: Principal | ICD-10-CM

## 2024-02-03 DIAGNOSIS — Z91148 Patient's other noncompliance with medication regimen for other reason: Secondary | ICD-10-CM | POA: Diagnosis not present

## 2024-02-03 DIAGNOSIS — N390 Urinary tract infection, site not specified: Secondary | ICD-10-CM | POA: Insufficient documentation

## 2024-02-03 DIAGNOSIS — G4700 Insomnia, unspecified: Secondary | ICD-10-CM | POA: Diagnosis not present

## 2024-02-03 DIAGNOSIS — Z91013 Allergy to seafood: Secondary | ICD-10-CM | POA: Diagnosis not present

## 2024-02-03 DIAGNOSIS — Z91041 Radiographic dye allergy status: Secondary | ICD-10-CM | POA: Diagnosis not present

## 2024-02-03 DIAGNOSIS — Z885 Allergy status to narcotic agent status: Secondary | ICD-10-CM | POA: Diagnosis not present

## 2024-02-03 DIAGNOSIS — Z881 Allergy status to other antibiotic agents status: Secondary | ICD-10-CM | POA: Diagnosis not present

## 2024-02-03 DIAGNOSIS — Z79899 Other long term (current) drug therapy: Secondary | ICD-10-CM | POA: Diagnosis not present

## 2024-02-03 DIAGNOSIS — H9192 Unspecified hearing loss, left ear: Secondary | ICD-10-CM | POA: Diagnosis not present

## 2024-02-03 DIAGNOSIS — Z86711 Personal history of pulmonary embolism: Secondary | ICD-10-CM | POA: Diagnosis not present

## 2024-02-03 DIAGNOSIS — F1994 Other psychoactive substance use, unspecified with psychoactive substance-induced mood disorder: Principal | ICD-10-CM | POA: Diagnosis present

## 2024-02-03 DIAGNOSIS — R45851 Suicidal ideations: Secondary | ICD-10-CM | POA: Diagnosis not present

## 2024-02-03 DIAGNOSIS — Z91018 Allergy to other foods: Secondary | ICD-10-CM | POA: Diagnosis not present

## 2024-02-03 DIAGNOSIS — Z882 Allergy status to sulfonamides status: Secondary | ICD-10-CM | POA: Diagnosis not present

## 2024-02-03 DIAGNOSIS — H6693 Otitis media, unspecified, bilateral: Secondary | ICD-10-CM | POA: Diagnosis not present

## 2024-02-03 DIAGNOSIS — F3281 Premenstrual dysphoric disorder: Secondary | ICD-10-CM | POA: Diagnosis not present

## 2024-02-03 DIAGNOSIS — F411 Generalized anxiety disorder: Secondary | ICD-10-CM | POA: Diagnosis not present

## 2024-02-03 DIAGNOSIS — F410 Panic disorder [episodic paroxysmal anxiety] without agoraphobia: Secondary | ICD-10-CM | POA: Diagnosis not present

## 2024-02-03 DIAGNOSIS — H6690 Otitis media, unspecified, unspecified ear: Secondary | ICD-10-CM | POA: Diagnosis not present

## 2024-02-03 DIAGNOSIS — F3181 Bipolar II disorder: Secondary | ICD-10-CM | POA: Diagnosis not present

## 2024-02-03 DIAGNOSIS — G43909 Migraine, unspecified, not intractable, without status migrainosus: Secondary | ICD-10-CM | POA: Diagnosis not present

## 2024-02-03 DIAGNOSIS — F909 Attention-deficit hyperactivity disorder, unspecified type: Secondary | ICD-10-CM | POA: Diagnosis not present

## 2024-02-03 DIAGNOSIS — F17290 Nicotine dependence, other tobacco product, uncomplicated: Secondary | ICD-10-CM | POA: Diagnosis not present

## 2024-02-03 LAB — HEMOGLOBIN A1C
Hgb A1c MFr Bld: 5.3 % (ref 4.8–5.6)
Mean Plasma Glucose: 105 mg/dL

## 2024-02-03 MED ORDER — TRAZODONE HCL 50 MG PO TABS
50.0000 mg | ORAL_TABLET | Freq: Every evening | ORAL | Status: DC | PRN
  Administered 2024-02-03 – 2024-02-08 (×6): 50 mg via ORAL
  Filled 2024-02-03 (×6): qty 1

## 2024-02-03 MED ORDER — LORAZEPAM 2 MG/ML IJ SOLN: 2.0000 mg | Freq: Three times a day (TID) | INTRAMUSCULAR | Status: DC | PRN

## 2024-02-03 MED ORDER — DIPHENHYDRAMINE HCL 50 MG/ML IJ SOLN: 50.0000 mg | Freq: Three times a day (TID) | INTRAMUSCULAR | Status: DC | PRN

## 2024-02-03 MED ORDER — HALOPERIDOL 5 MG PO TABS: 5.0000 mg | ORAL_TABLET | Freq: Three times a day (TID) | ORAL | Status: DC | PRN

## 2024-02-03 MED ORDER — DIPHENHYDRAMINE HCL 25 MG PO CAPS: 50.0000 mg | ORAL_CAPSULE | Freq: Four times a day (QID) | ORAL | Status: DC | PRN

## 2024-02-03 MED ORDER — HALOPERIDOL LACTATE 5 MG/ML IJ SOLN: 10.0000 mg | Freq: Three times a day (TID) | INTRAMUSCULAR | Status: DC | PRN

## 2024-02-03 MED ORDER — MAGNESIUM HYDROXIDE 400 MG/5ML PO SUSP: 30.0000 mL | Freq: Every day | ORAL | Status: DC | PRN

## 2024-02-03 MED ORDER — HALOPERIDOL LACTATE 5 MG/ML IJ SOLN: 5.0000 mg | Freq: Three times a day (TID) | INTRAMUSCULAR | Status: DC | PRN

## 2024-02-03 MED ORDER — HYDROXYZINE HCL 25 MG PO TABS
25.0000 mg | ORAL_TABLET | Freq: Three times a day (TID) | ORAL | Status: DC | PRN
  Administered 2024-02-03 – 2024-02-08 (×6): 25 mg via ORAL
  Filled 2024-02-03 (×6): qty 1

## 2024-02-03 MED ORDER — ACETAMINOPHEN 325 MG PO TABS
650.0000 mg | ORAL_TABLET | Freq: Four times a day (QID) | ORAL | Status: DC | PRN
  Administered 2024-02-09: 650 mg via ORAL
  Filled 2024-02-03: qty 2

## 2024-02-03 MED ORDER — DIPHENHYDRAMINE HCL 25 MG PO CAPS: 50.0000 mg | ORAL_CAPSULE | Freq: Three times a day (TID) | ORAL | Status: DC | PRN

## 2024-02-03 MED ORDER — ALUM & MAG HYDROXIDE-SIMETH 200-200-20 MG/5ML PO SUSP: 30.0000 mL | ORAL | Status: DC | PRN

## 2024-02-03 MED ORDER — NICOTINE 21 MG/24HR TD PT24: 21.0000 mg | MEDICATED_PATCH | Freq: Every day | TRANSDERMAL | Status: DC

## 2024-02-03 NOTE — Progress Notes (Signed)
 Pt admitted to room 404-1. Skin check completed by previous shift. Paperwork signed and interview complete by this clinical research associate. Pt has verious tattoos on bilat arms and chest. Partial hearing loss in lt ear. Pt states she uses cipro  ear gtts. at home. States she has problems with chronic migraines, ADHD and UTIs. LMP 2 weeks ago. PCP is Dr Glendia Piedmont, pt does not see him regular. States she has lost a lot of weight but could not give me amount of weight lost or timeframe in which she lost it. Diet is regular. Denies any physical, verbal or sexual abuse now or in the past. C/o agitation, anger, decreased appetite, problems with concentration, crying spells, depression, hyper, irritability, insomnia, loneliness, nervousness, panic attacks, sadness, worry, and tension. To calm down she likes to be alone and listen to music. She lives with her mother and states mom is her support and a stressor. Also states sister is a support for her. Pt was calm and cooperative with interview, animated, and pleasant. Oriented to room, med window and nurses station. Refused sandwich or snack. Drink given. Educated on sleeping medication. All concerns answered.

## 2024-02-03 NOTE — Group Note (Signed)
 Date:  02/03/2024 Time:  7:15 PM  Group Topic/Focus: Emotional Wellness Emotional Education:   The focus of this group is to discuss what feelings/emotions are, and how they are experienced.    Participation Level:  Patient was not on unit.  Participation Quality:  NA  Affect:  NA  Cognitive:  NA  Insight: NA  Engagement in Group:  NA  Modes of Intervention:  NA  Additional Comments:  Patient was not on unit  Rosaleen BIRCH Arlana Canizales 02/03/2024, 7:15 PM

## 2024-02-03 NOTE — ED Notes (Signed)
 Patient is resting at this time, no acute distress noted.  Respirations present, even and unlabored.  No issues noted at this time.  Will continue Q 15 min safety checks for safety/behavior per facility protocol.

## 2024-02-03 NOTE — BHH Group Notes (Signed)
 Patient did not attend Recreational Therapy Group.

## 2024-02-03 NOTE — Plan of Care (Signed)
   Problem: Education: Goal: Knowledge of [place] [facility] Education information/materials will improve Outcome: Progressing Goal: Emotional status will improve Outcome: Progressing Goal: Mental status will improve Outcome: Progressing Goal: Verbalization of understanding the information provided will improve Outcome: Progressing

## 2024-02-03 NOTE — BHH Group Notes (Signed)
 Courtney [name] did not attend wrap up group

## 2024-02-03 NOTE — ED Notes (Signed)
 Pt discharged to [facility] via [employer], patient belongings given to officer, pt satisfied with belongings. Pt denies any other needs, no acute distress noted.

## 2024-02-03 NOTE — Progress Notes (Signed)
 Patient is currently resting with eyes closed at this time.  No acute distress noted.  Respirations present, even and unlabored.  No issues noted at this time.  Will continue Q 15 min safety checks for safety/behavior per facility protocol.

## 2024-02-04 MED ORDER — ARIPIPRAZOLE 5 MG PO TABS
5.0000 mg | ORAL_TABLET | Freq: Every day | ORAL | Status: DC
  Administered 2024-02-04 – 2024-02-09 (×6): 5 mg via ORAL
  Filled 2024-02-04 (×6): qty 1

## 2024-02-04 MED ORDER — FLUTICASONE PROPIONATE 50 MCG/ACT NA SUSP
2.0000 | Freq: Every day | NASAL | Status: DC
  Administered 2024-02-04 – 2024-02-07 (×4): 2 via NASAL
  Filled 2024-02-04: qty 32

## 2024-02-04 MED ORDER — CIPROFLOXACIN-DEXAMETHASONE 0.3-0.1 % OT SUSP
3.0000 [drp] | Freq: Two times a day (BID) | OTIC | Status: DC
  Administered 2024-02-04 – 2024-02-08 (×9): 3 [drp] via OTIC
  Filled 2024-02-04: qty 7.5

## 2024-02-04 MED ORDER — AMOXICILLIN-POT CLAVULANATE 875-125 MG PO TABS
1.0000 | ORAL_TABLET | Freq: Two times a day (BID) | ORAL | Status: DC
  Administered 2024-02-04 – 2024-02-09 (×11): 1 via ORAL
  Filled 2024-02-04 (×11): qty 1

## 2024-02-04 MED ORDER — VENLAFAXINE HCL ER 37.5 MG PO CP24
37.5000 mg | ORAL_CAPSULE | Freq: Every day | ORAL | Status: DC
  Administered 2024-02-05 – 2024-02-09 (×5): 37.5 mg via ORAL
  Filled 2024-02-04 (×5): qty 1

## 2024-02-04 NOTE — Group Note (Signed)
 Recreation Therapy Group Note   Group Topic:Animal Assisted Therapy   Group Date: 02/04/2024 Start Time: 9048 End Time: 1030 Facilitators: Valentine Barney-McCall, LRT,CTRS Location: [address] Dayroom   Animal-Assisted Activity (AAA) Program Checklist/Progress Notes Patient Eligibility Criteria Checklist & Daily Group note for Rec Tx Intervention  AAA/T Program Assumption of Risk Form signed by Patient/ or Parent Legal Guardian Yes  Patient understands his/her participation is voluntary Yes  Behavioral Response:    Education: Charity Fundraiser, Appropriate Animal Interaction   Education Outcome: Acknowledges education.    Affect/Mood: N/A   Participation Level: Did not attend    Clinical Observations/Individualized Feedback:      Plan: Continue to engage patient in RT group sessions 2-3x/week.   Shaneeka Scarboro-McCall, LRT,CTRS 02/04/2024 1:32 PM

## 2024-02-04 NOTE — Progress Notes (Signed)
(  Sleep Hours) -7.25 as of 0530 (Any PRNs that were needed, meds refused, or side effects to meds)- prn hydroxyzine  and trazodone  given @ 2124 (Any disturbances and when (visitation, over night)-none (Concerns raised by the patient)- none (SI/HI/AVH)- denies all

## 2024-02-04 NOTE — BHH Suicide Risk Assessment (Signed)
 Suicide Risk Assessment  Admission Assessment    [facility] Admission Suicide Risk Assessment   Nursing information obtained from:  Patient Demographic factors:  Caucasian Current Mental Status:  NA Loss Factors:  Loss of significant relationship Historical Factors:  Impulsivity Risk Reduction Factors:  Living with another person, especially a relative  Total Time spent with patient: 1.5 hours Principal Problem: Bipolar 2 disorder, major depressive episode (HCC) Diagnosis:  Principal Problem:   Bipolar 2 disorder, major depressive episode (HCC)  Subjective Data: Courtney [name] is a 33 year old female with a past psychiatric history significant for Bipolar II disorder, ADHD and substance use disorder. She was admitted to the [facility] under IVC following evaluation at the [facility], where she was brought in by law enforcement due to reported suicidal ideation after an altercation with her mother. The patient has reportedly been noncompliant with her psychotropic medications for approximately the past two years.  Per IVC Petition: Respondent has a mental health history of Bipolar Il disorder, ADHD, and methamphetamine use disorder. Patient reports she has no been taking her prescribed mental health medications for the past two years. According to the patient's mother, the patient stated yesterday that she was going to kill herself, her mother, and the officer who brought her to urgent care. She presents to urgent care disheveled and appears to have been neglecting her hygiene. She has been agitated, screaming, and uncooperative with care. Patient will benefit from inpatient hospitalization to restart her medications and prevent further mental health decompensation.   Continued Clinical Symptoms:  Alcohol Use Disorder Identification Test Final Score (AUDIT): 0 The Alcohol Use Disorders Identification Test, Guidelines for Use in Primary Care, Second  Edition.  World Science Writer [facility]). Score between 0-7:  no or low risk or alcohol related problems. Score between 8-15:  moderate risk of alcohol related problems. Score between 16-19:  high risk of alcohol related problems. Score 20 or above:  warrants further diagnostic evaluation for alcohol dependence and treatment.   CLINICAL FACTORS:   Severe Anxiety and/or Agitation Panic Attacks Bipolar Disorder:   Bipolar II Alcohol/Substance Abuse/Dependencies Unstable or Poor Therapeutic Relationship Previous Psychiatric Diagnoses and Treatments Medical Diagnoses and Treatments/Surgeries   Musculoskeletal: Strength & Muscle Tone: within normal limits Gait & Station: normal Patient leans: N/A  Psychiatric Specialty Exam:  Presentation  General Appearance:  Disheveled  Eye Contact: Fair  Speech: Clear and Coherent  Speech Volume: Normal  Handedness: Right   Mood and Affect  Mood: Anxious  Affect: Labile; Tearful   Thought Process  Thought Processes: Coherent; Goal Directed  Descriptions of Associations:Intact  Orientation:Full (Time, Place and Person)  Thought Content:Logical  History of Schizophrenia/Schizoaffective disorder:No  Duration of Psychotic Symptoms:No data recorded Hallucinations:Hallucinations: None  Ideas of Reference:None  Suicidal Thoughts:Suicidal Thoughts: No SI Active Intent and/or Plan: -- (Denies)  Homicidal Thoughts:Homicidal Thoughts: No   Sensorium  Memory: Immediate Fair  Judgment: Fair  Insight: Fair   Art Therapist  Concentration: Fair  Attention Span: Fair  Recall: [employer] of Knowledge: Fair  Language: Fair   Psychomotor Activity  Psychomotor Activity:Psychomotor Activity: Restlessness   Assets  Assets: Manufacturing Systems Engineer; Desire for Improvement; Resilience; Social Support   Sleep  Sleep:Sleep: Fair    Physical Exam: Physical Exam ROS Blood pressure 108/74, pulse  (!) 109, temperature 98.2 F (36.8 C), temperature source Oral, resp. rate 18, height 5' 1 (1.549 m), weight 70.3 kg, SpO2 100%. Body mass index is 29.28 kg/m.  COGNITIVE FEATURES THAT CONTRIBUTE TO RISK:  Closed-mindedness and Polarized thinking    SUICIDE RISK:   Mild:  Suicidal ideation of limited frequency, intensity, duration, and specificity.  There are no identifiable plans, no associated intent, mild dysphoria and related symptoms, good self-control (both objective and subjective assessment), few other risk factors, and identifiable protective factors, including available and accessible social support.  PLAN OF CARE: See H&P for assessment and plan.  I certify that inpatient services furnished can reasonably be expected to improve the patient's condition.   Blair Chiquita Hint, NP 02/04/2024, 4:31 PM

## 2024-02-04 NOTE — BHH Suicide Risk Assessment (Signed)
 BHH INPATIENT:  Family/Significant Other Suicide Prevention Education  Suicide Prevention Education:  Education Completed; Arelie Kuzel - [phone],  (mother) has been identified by the patient as the family member/significant other with whom the patient will be residing, and identified as the person(s) who will aid the patient in the event of a mental health crisis (suicidal ideations/suicide attempt).  With written consent from the patient, the family member/significant other has been provided the following suicide prevention education, prior to the and/or following the discharge of the patient.  Lonell indicates that she does not believe pt is a danger to herself or others.  She states she does not have access to any weapons, dangerous objects, etc.at her home, or her father's home Jacklynn Christmas).  The suicide prevention education provided includes the following: Suicide risk factors Suicide prevention and interventions National Suicide Hotline telephone number [facility] assessment telephone number [facility] Emergency Assistance 911 [facility] and/or Residential Mobile Crisis Unit telephone number  Request made of family/significant other to: Remove weapons (e.g., guns, rifles, knives), all items previously/currently identified as safety concern.   Remove drugs/medications (over-the-counter, prescriptions, illicit drugs), all items previously/currently identified as a safety concern.  The family member/significant other verbalizes understanding of the suicide prevention education information provided.  The family member/significant other agrees to remove the items of safety concern listed above.  Derick JONELLE Blanch, LCSW 02/04/2024, 9:46 AM

## 2024-02-04 NOTE — BHH Group Notes (Signed)
 BHH Group Notes:  (Nursing/MHT/Case Management/Adjunct)  Date:  02/04/2024  Time:  10:50 PM  Type of Therapy:  Psychoeducational Skills  Participation Level:  Minimal  Participation Quality:  Appropriate  Affect:  Appropriate  Cognitive:  Lacking  Insight:  Lacking  Engagement in Group:  Developing/Improving  Modes of Intervention:  Education  Summary of Progress/Problems: Patient rated her day as a 6 out of a possible 10 since she made some new friends today. She had nothing else to share about her day.   Courtney [name] S 02/04/2024, 10:50 PM

## 2024-02-04 NOTE — Group Note (Signed)
 Date:  02/04/2024 Time:  10:33 AM  Group Topic/Focus: Coping skills and orientation goals group Goals Group:   The focus of this group is to help patients establish daily goals to achieve during treatment and discuss how the patient can incorporate goal setting into their daily lives to aide in recovery. Orientation:   The focus of this group is to educate the patient on the purpose and policies of crisis stabilization and provide a format to answer questions about their admission.  The group details unit policies and expectations of patients while admitted.    Participation Level:  Did Not Attend   Courtney [name] 02/04/2024, 10:33 AM

## 2024-02-04 NOTE — H&P (Incomplete)
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    Physician Treatment Plan for Primary Diagnosis: Bipolar 2 disorder, major depressive episode (HCC) Long Term Goal(s): Improvement in symptoms so as ready for discharge  Short Term Goals: Ability to identify changes in lifestyle to reduce recurrence of condition will improve, Ability to verbalize feelings will improve, Ability to disclose and discuss suicidal ideas, Ability to demonstrate self-control will improve, Ability to identify and develop effective coping behaviors will improve, Ability to maintain clinical measurements within normal limits will improve, Compliance with prescribed medications will improve, and Ability  to identify triggers associated with substance abuse/mental health issues will improve  I certify that inpatient services furnished can reasonably be expected to improve the patient's condition.    Blair Chiquita Hint, NP 12/3/20256:36 AM

## 2024-02-04 NOTE — Plan of Care (Signed)
   Problem: Education: Goal: Knowledge of [facility] Education information/materials will improve Outcome: Progressing Goal: Emotional status will improve Outcome: Progressing

## 2024-02-04 NOTE — Group Note (Signed)
 LCSW Group Therapy Note   Group Date: 02/04/2024 Start Time: 1100 End Time: 1200   Participation:  did not attend  Type of Therapy:  Group Therapy   Topic:  Healing From Within: Understanding Our Past, Building Our Future    Objective:  To help participants understand the impact of early experiences on mental and physical health, with a focus on Adverse Childhood Experiences (ACEs), and to explore ways to build resilience and healing.  Group Goals: Understand ACEs and Their Impact: Learn how childhood experiences shape mental and physical health. Build Resilience: Develop strategies for overcoming challenges and creating positive change. Promote Healing: Recognize the value of support and the possibility of healing through therapy and self-care.  Summary: In today's session, we discussed how early experiences, especially ACEs, impact mental and physical health. We explored the effects of stress, abuse, and neglect on brain development and well-being. The group focused on resilience, understanding that healing and positive change are possible with support and self-awareness.  Therapeutic Modalities Used: Psychoeducation: Sharing information about ACEs and their effects. Cognitive Behavioral Therapy (CBT): Helping reframe negative thought patterns. Trauma-Informed Therapy: Creating a safe, supportive space for healing.   Amneet Cendejas O Burgandy Hackworth, LCSWA 02/04/2024  12:26 PM

## 2024-02-04 NOTE — Plan of Care (Signed)
   Problem: Education: Goal: Emotional status will improve Outcome: Progressing Goal: Verbalization of understanding the information provided will improve Outcome: Progressing

## 2024-02-04 NOTE — Group Note (Signed)
 Date:  02/04/2024 Time:  7:46 PM  Group Topic/Focus:  Goals Group:   The focus of this group is to help patients establish daily goals to achieve during treatment and discuss how the patient can incorporate goal setting into their daily lives to aide in recovery.  Participation Level:  Active  Participation Quality:  Appropriate  Affect:  Appropriate  Cognitive:  Appropriate  Insight: Appropriate  Engagement in Group:  Engaged  Modes of Intervention:  Discussion and Education  Additional Comments:    Courtney [name] 02/04/2024, 7:46 PM

## 2024-02-04 NOTE — Group Note (Signed)
 Date:  02/04/2024 Time:  4:06 PM  Group Topic/Focus: Social work Patient explaining the meaning of trauma and how to prevent it or when it comes to those circumstances Managing Feelings:   The focus of this group is to identify what feelings patients have difficulty handling and develop a plan to handle them in a healthier way upon discharge. Overcoming Stress:   The focus of this group is to define stress and help patients assess their triggers.    Participation Level:  Active   Courtney [name] HERO Rafaela Dinius 02/04/2024, 4:06 PM

## 2024-02-04 NOTE — Group Note (Signed)
 Date:  02/04/2024 Time:  11:21 AM  Group Topic/Focus: Pet therapy  Overcoming Stress:   The focus of this group is to define stress and help patients assess their triggers.    Participation Level:  Did Not Attend  Courtney [name] 02/04/2024, 11:21 AM

## 2024-02-04 NOTE — [facility] Counselor (Signed)
 Adult Comprehensive Assessment  Patient ID: Courtney [name], female   DOB: 03-11-1990, 33 y.o.   MRN: [id number]  Information Source: Information source: Patient  Current Stressors:  Patient states their primary concerns and needs for treatment are:: I don't know. Patient states their goals for this hospitilization and ongoing recovery are:: I wanna be discharged. Educational / Learning stressors: None reported Employment / Job issues: Unemployed Family Relationships: It's okay. Financial / Lack of resources (include bankruptcy): Yes; reports financial issues due to unemployment and reliance on mom for monies. Housing / Lack of housing: None reported Physical health (include injuries & life threatening diseases): It's okay. Social relationships: Good Substance abuse: Denies Bereavement / Loss: Grandfather a few  years ago; grandmother died in Jun 16, 2023 Living/Environment/Situation:  Living Arrangements: Parent Living conditions (as described by patient or guardian): It's loving and supportive. Who else lives in the home?: Mom, cat How long has patient lived in current situation?: All her life; has not lived independently. What is atmosphere in current home: Loving, Supportive  Family History:  Marital status: Single Are you sexually active?: No What is your sexual orientation?: Heterosexual Has your sexual activity been affected by drugs, alcohol, medication, or emotional stress?: N/A Does patient have children?: No  Childhood History:  By whom was/is the patient raised?: Mother Additional childhood history information: Parents divorced at an early age; father remarried and had a daughter (half-sister to patient) Description of patient's relationship with caregiver when they were a child: It was good. Patient's description of current relationship with people who raised him/her: Okay How were you disciplined when you got in trouble as a child/adolescent?:  Whoopings but my mom didn't believe in harsh punishment. Does patient have siblings?: Yes Number of Siblings: 1 Description of patient's current relationship with siblings: Half sister on father's side after he remarried. 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?: No  Financial Resources:   Financial resources: No income Does patient have a lawyer or guardian?: No  Alcohol/Substance Abuse:   What has been your use of drugs/alcohol within the last 12 months?: Denies alcohol or substance use. If attempted suicide, did drugs/alcohol play a role in this?: No Alcohol/Substance Abuse Treatment Hx: Denies past history Has alcohol/substance abuse ever caused legal problems?: No  Social Support System:   Patient's Community Support System: Good Describe Community Support System: Friends, my mom and cat. Type of faith/religion: Christian; [employer] denomination How does patient's faith help to cope with current illness?: None  Leisure/Recreation:      Strengths/Needs:   What is the patient's perception of their strengths?: I am reliable. Patient states they can use these personal strengths during their treatment to contribute to their  recovery: None reported Patient states these barriers may affect/interfere with their treatment: None reported Patient states these barriers  may affect their return to the community: Not sure. Other important information patient would like considered in planning for their treatment: None reported  Discharge Plan:   Currently receiving community mental health services: No Patient states concerns and preferences for aftercare planning are: Not sure. Patient states they will know when they are safe and ready for discharge when: I don't know. Does patient have access to transportation?: Yes Does patient have financial barriers related to discharge medications?: No Patient description of barriers related to discharge medications: N/A Will patient be returning to same living situation after discharge?: Yes  Summary/Recommendations:   Summary and Recommendations (to be completed by the evaluator): Patient, Courtney [name], is a 33 year old single Caucasian female who voluntarily presents to [facility] from [facility] after she and her mother were involved in a verbal altercation. At present, pt is under an involuntary committment order.  Vandana's mother called the police and the police gave her two options; to be mentally evaluated at the hospital or to go to jail.  Pt appeared lethargic, hypoverbal, blunt speech, mood is dysthymic/depressed affect.  Although cooperative, unable to elaborate much on answers to questions during assessment.  She denies active, current SI/HI/DI/SIB at present and does not endorse AVH, as well.  Pt is diagnosed with Bipolar II, not taking medication or receiving mental health services in the community at this time. Hx of substance use, not with in the last year. Pt is seeking outpatient therapy and medication management to address mood related issues. UDS indicates pt is positive for amphetamines and BAR.  While here, Madilynn can benefit from crisis stabilization, medication  management, therapeutic milieu, and referrals for services.   Derick JONELLE Blanch, LCSW 02/04/2024

## 2024-02-04 NOTE — Group Note (Signed)
 Date:  02/04/2024 Time:  4:30 PM  Group Topic/Focus:[facility] Coping With Mental Health Crisis:   The purpose of this group is to help patients identify strategies for coping with mental health crisis.  Group discusses possible causes of crisis and ways to manage them effectively.    Participation Level:  Active  Dolores HERO Konner Warrior 02/04/2024, 4:30 PM

## 2024-02-05 ENCOUNTER — Encounter (HOSPITAL_COMMUNITY): Payer: Self-pay

## 2024-02-05 DIAGNOSIS — H6690 Otitis media, unspecified, unspecified ear: Secondary | ICD-10-CM

## 2024-02-05 DIAGNOSIS — N390 Urinary tract infection, site not specified: Secondary | ICD-10-CM

## 2024-02-05 DIAGNOSIS — F411 Generalized anxiety disorder: Secondary | ICD-10-CM

## 2024-02-05 DIAGNOSIS — G4700 Insomnia, unspecified: Secondary | ICD-10-CM

## 2024-02-05 DIAGNOSIS — F3281 Premenstrual dysphoric disorder: Secondary | ICD-10-CM

## 2024-02-05 MED ORDER — SUMATRIPTAN SUCCINATE 50 MG PO TABS: 100.0000 mg | ORAL_TABLET | ORAL | Status: DC | PRN

## 2024-02-05 MED ORDER — ENSURE PLUS HIGH PROTEIN PO LIQD
237.0000 mL | Freq: Two times a day (BID) | ORAL | Status: DC
  Administered 2024-02-05 – 2024-02-09 (×7): 237 mL via ORAL
  Filled 2024-02-05 (×8): qty 237

## 2024-02-05 MED ORDER — ONDANSETRON 4 MG PO TBDP
4.0000 mg | ORAL_TABLET | Freq: Once | ORAL | Status: AC
  Administered 2024-02-05: 4 mg via ORAL
  Filled 2024-02-05: qty 1

## 2024-02-05 NOTE — Group Note (Signed)
 Date:  02/05/2024 Time:  4:47 PM  Group Topic/Focus:  Nursing Group     Participation Level:  Did Not Attend   Logan LITTIE [name] 02/05/2024, 4:47 PM
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
## 2024-02-05 NOTE — Group Note (Signed)
 Date:  02/05/2024 Time:  10:04 AM  Group Topic/Focus:  Goals Group:   The focus of this group is to help patients establish daily goals to achieve during treatment and discuss how the patient can incorporate goal setting into their daily lives to aide in recovery.    Participation Level:  Did Not Attend

## 2024-02-05 NOTE — Group Note (Signed)
 Date:  02/05/2024 Time:  3:21 PM  Group Topic/Focus:  Spirituality:   The focus of this group is to discuss how one's spirituality can aide in recovery.    Participation Level:  Did Not Attend   Courtney [name] 02/05/2024, 3:21 PM

## 2024-02-05 NOTE — Group Note (Signed)
 Date:  02/05/2024 Time:  5:27 PM  Group Topic/Focus:  Developing a Wellness Toolbox:   The focus of this group is to help patients develop a wellness toolbox with skills and strategies to promote recovery upon discharge. Sleep Hygeine   Participation Level:  Did Not Attend    Courtney [name] 02/05/2024, 5:27 PM

## 2024-02-05 NOTE — Group Note (Signed)
 Recreation Therapy Group Note   Group Topic:Team Building  Group Date: 02/05/2024 Start Time: 0930 End Time: 1002 Facilitators: Alanmichael Barmore-McCall, LRT,CTRS Location: [address] Dayroom   Group Topic: Communication, Team Building, Problem Solving  Goal Area(s) Addresses:  Patient will effectively work with peer towards shared goal.  Patient will identify skills used to make activity successful.  Patient will identify how skills used during activity can be used to reach post d/c goals.   Behavioral Response:   Intervention: STEM Activity  Activity: Straw Bridge. In teams of 3-5, patients were given 15 plastic drinking straws and an equal length of masking tape. Using the materials provided, patients were instructed to build a free standing bridge-like structure to suspend an everyday item (ex: puzzle box) off of the floor or table surface. All materials were required to be used by the team in their design. LRT facilitated post-activity discussion reviewing team process. Patients were encouraged to reflect how the skills used in this activity can be generalized to daily life post discharge.   Education: Pharmacist, Community, Scientist, Physiological, Discharge Planning   Education Outcome: Acknowledges education/In group clarification offered/Needs additional education.    Affect/Mood: N/A   Participation Level: Did not attend    Clinical Observations/Individualized Feedback:      Plan: Continue to engage patient in RT group sessions 2-3x/week.   Aaliyan Brinkmeier-McCall, LRT,CTRS 02/05/2024 1:17 PM

## 2024-02-05 NOTE — Group Note (Signed)
 Date:  02/05/2024 Time:  11:22 AM  Group Topic/Focus:  Recreational Therapy    Participation Level:  Did Not Attend   Courtney [name] [name] 02/05/2024, 11:22 AM

## 2024-02-05 NOTE — Progress Notes (Signed)
(  Sleep Hours) -12.75 as of 0530 (Any PRNs that were needed, meds refused, or side effects to meds)- prn hydroxyzine  and trazodone  @ 2046 (Any disturbances and when (visitation, over night)-none (Concerns raised by the patient)- none (SI/HI/AVH)- denies all

## 2024-02-05 NOTE — BH IP Treatment Plan (Signed)
 Interdisciplinary Treatment and Diagnostic Plan Update  02/05/2024 Time of Session: 10:20 AM Courtney [name] MRN: [id number]  Principal Diagnosis: Bipolar 2 disorder, major depressive episode (HCC)  Secondary Diagnoses: Principal Problem:   Bipolar 2 disorder, major depressive episode (HCC) Active Problems:   Insomnia   GAD (generalized anxiety disorder)   UTI (urinary tract infection)   Otitis media   Current Medications:  Current Facility-Administered Medications  Medication Dose Route Frequency Provider Last Rate Last Admin   acetaminophen  (TYLENOL ) tablet 650 mg  650 mg Oral Q6H PRN Olasunkanmi, Oluwatosin, NP       alum & mag hydroxide-simeth (MAALOX/MYLANTA) 200-200-20 MG/5ML suspension 30 mL  30 mL Oral Q4H PRN Olasunkanmi, Oluwatosin, NP       amoxicillin -clavulanate (AUGMENTIN ) 875-125 MG per tablet 1 tablet  1 tablet Oral BID Bennett, Christal H, NP   1 tablet at 02/05/24 0844   ARIPiprazole  (ABILIFY ) tablet 5 mg  5 mg Oral Daily Bennett, Christal H, NP   5 mg at 02/05/24 0844   ciprofloxacin -dexamethasone  (CIPRODEX ) 0.3-0.1 % OTIC (EAR) suspension 3 drop  3 drop Left EAR BID Bennett, Christal H, NP   3 drop at 02/05/24 0845   haloperidol  (HALDOL ) tablet 5 mg  5 mg Oral TID PRN Olasunkanmi, Oluwatosin, NP       And   diphenhydrAMINE  (BENADRYL ) capsule 50 mg  50 mg Oral TID PRN Olasunkanmi, Oluwatosin, NP       diphenhydrAMINE  (BENADRYL ) capsule 50 mg  50 mg Oral Q6H PRN Olasunkanmi, Oluwatosin, NP       haloperidol  lactate (HALDOL ) injection 5 mg  5 mg Intramuscular TID PRN Olasunkanmi, Oluwatosin, NP       And   diphenhydrAMINE  (BENADRYL ) injection 50 mg  50 mg Intramuscular TID PRN Olasunkanmi, Oluwatosin, NP       And   LORazepam (ATIVAN) injection 2 mg  2 mg Intramuscular TID PRN Olasunkanmi, Oluwatosin, NP       haloperidol  lactate (HALDOL ) injection 10 mg  10 mg Intramuscular TID PRN Olasunkanmi, Oluwatosin, NP       And   diphenhydrAMINE  (BENADRYL ) injection 50 mg   50 mg Intramuscular TID PRN Olasunkanmi, Oluwatosin, NP       And   LORazepam (ATIVAN) injection 2 mg  2 mg Intramuscular TID PRN Olasunkanmi, Oluwatosin, NP       feeding supplement (ENSURE PLUS HIGH PROTEIN) liquid 237 mL  237 mL Oral BID BM Bennett, Christal H, NP       fluticasone  (FLONASE ) 50 MCG/ACT nasal spray 2 spray  2 spray Each Nare Daily Bennett, Christal H, NP   2 spray at 02/05/24 0844   hydrOXYzine  (ATARAX ) tablet 25 mg  25 mg Oral TID PRN Olasunkanmi, Oluwatosin, NP   25 mg at 02/04/24 2046   magnesium  hydroxide (MILK OF MAGNESIA) suspension 30 mL  30 mL Oral Daily PRN Olasunkanmi, Oluwatosin, NP       ondansetron  (ZOFRAN -ODT) disintegrating tablet 4 mg  4 mg Oral Once Bennett, Christal H, NP       SUMAtriptan (IMITREX) tablet 100 mg  100 mg Oral Q4H PRN Bennett, Christal H, NP       traZODone  (DESYREL ) tablet 50 mg  50 mg Oral QHS PRN Olasunkanmi, Oluwatosin, NP   50 mg at 02/04/24 2046   venlafaxine XR (EFFEXOR-XR) 24 hr capsule 37.5 mg  37.5 mg Oral Q breakfast Bennett, Christal H, NP   37.5 mg at 02/05/24 0844   PTA Medications: Medications Prior to Admission  Medication Sig Dispense Refill Last Dose/Taking   albuterol  (VENTOLIN  HFA) 108 (90 Base) MCG/ACT inhaler TAKE 2 PUFFS BY MOUTH EVERY 6 HOURS AS NEEDED FOR WHEEZE OR SHORTNESS OF BREATH (Patient taking differently: Inhale 2 puffs into the lungs every 6 (six) hours as needed for wheezing or shortness of breath.) 8.5 each 1    amoxicillin -clavulanate (AUGMENTIN ) 875-125 MG tablet Take 1 tablet by mouth 2 (two) times daily for 7 days. 13 tablet 0    bimekizumab-bkzx (BIMZELX) 320 MG/2ML pen Inject 320 mg into the skin every 30 (thirty) days.      ciprofloxacin -dexamethasone  (CIPRODEX ) OTIC suspension Place 3 drops into the left ear 2 (two) times daily. Instill for 7 days starting on 02/01/24      EMGALITY 120 MG/ML SOAJ Inject 120 mg into the skin every 30 (thirty) days.      EPINEPHrine  (EPIPEN  2-PAK) 0.3 mg/0.3 mL IJ SOAJ  injection Inject 0.3 mg into the muscle as needed for anaphylaxis. 1 each 0    fluticasone  (FLONASE ) 50 MCG/ACT nasal spray Place 2 sprays into both nostrils daily.      inFLIXimab -axxq in sodium chloride  0.9 % Inject 10 mg/kg into the vein every 30 (thirty) days. Patient received 700mg  at [facility] - [address], [place] on [id number]      montelukast  (SINGULAIR ) 10 MG tablet TAKE 1 TABLET BY MOUTH EVERYDAY AT BEDTIME (Patient taking differently: Take 10 mg by mouth at bedtime.) 90 tablet 0    norethindrone (MICRONOR) 0.35 MG tablet Take 0.35 mg by mouth daily.      nystatin  (MYCOSTATIN /NYSTOP ) powder Apply 1 Application topically 2 (two) times daily.      SUMAtriptan (IMITREX) 100 MG tablet Take 100 mg by mouth every 2 (two) hours as needed for migraine or headache.       Patient Stressors:    Patient Strengths:    Treatment Modalities: Medication Management, Group therapy, Case management,  1 to 1 session with clinician, Psychoeducation, Recreational therapy.   Physician Treatment Plan for Primary Diagnosis: Bipolar 2 disorder, major depressive episode (HCC) Long Term Goal(s): Improvement in symptoms so as ready for discharge   Short Term Goals: Ability to identify changes in lifestyle to reduce recurrence of condition will improve Ability to verbalize feelings will improve Ability to disclose and discuss suicidal ideas Ability to demonstrate self-control will improve Ability to identify and develop effective coping behaviors will improve Ability to maintain clinical measurements within normal limits will improve Compliance with prescribed medications will improve Ability to identify triggers associated with substance abuse/mental health issues will improve  Medication Management: Evaluate patient's response, side effects, and tolerance of medication regimen.  Therapeutic Interventions: 1 to 1 sessions, Unit Group sessions and Medication  administration.  Evaluation of Outcomes: Not Progressing  Physician Treatment Plan for Secondary Diagnosis: Principal Problem:   Bipolar 2 disorder, major depressive episode (HCC) Active Problems:   Insomnia   GAD (generalized anxiety disorder)   UTI (urinary tract infection)   Otitis media  Long Term Goal(s): Improvement in symptoms so as ready for discharge   Short Term Goals: Ability to identify changes in lifestyle to reduce recurrence of condition will improve Ability to verbalize feelings will improve Ability to disclose and discuss suicidal ideas Ability to demonstrate self-control will improve Ability to identify and develop effective coping behaviors will improve Ability to maintain clinical measurements within normal limits will improve Compliance with prescribed medications will improve Ability to identify triggers associated with substance abuse/mental health  issues will improve     Medication Management: Evaluate patient's response, side effects, and tolerance of medication regimen.  Therapeutic Interventions: 1 to 1 sessions, Unit Group sessions and Medication administration.  Evaluation of Outcomes: Not Progressing   RN Treatment Plan for Primary Diagnosis: Bipolar 2 disorder, major depressive episode (HCC) Long Term Goal(s): Knowledge of disease and therapeutic regimen to maintain health will improve  Short Term Goals: Ability to remain free from injury will improve, Ability to verbalize frustration and anger appropriately will improve, Ability to demonstrate self-control, Ability to participate in decision making will improve, Ability to verbalize feelings will improve, Ability to disclose and discuss suicidal ideas, Ability to identify and develop effective coping behaviors will improve, and Compliance with prescribed medications will improve  Medication Management: RN will administer medications as ordered by provider, will assess and evaluate patient's response  and provide education to patient for prescribed medication. RN will report any adverse and/or side effects to prescribing provider.  Therapeutic Interventions: 1 on 1 counseling sessions, Psychoeducation, Medication administration, Evaluate responses to treatment, Monitor vital signs and CBGs as ordered, Perform/monitor CIWA, COWS, AIMS and Fall Risk screenings as ordered, Perform wound care treatments as ordered.  Evaluation of Outcomes: Not Progressing   LCSW Treatment Plan for Primary Diagnosis: Bipolar 2 disorder, major depressive episode (HCC) Long Term Goal(s): Safe transition to appropriate next level of care at discharge, Engage patient in therapeutic group addressing interpersonal concerns.  Short Term Goals: Engage patient in aftercare planning with referrals and resources, Increase social support, Increase ability to appropriately verbalize feelings, Increase emotional regulation, Facilitate acceptance of mental health diagnosis and concerns, Facilitate patient progression through stages of change regarding substance use diagnoses and concerns, Identify triggers associated with mental health/substance abuse issues, and Increase skills for wellness and recovery  Therapeutic Interventions: Assess for all discharge needs, 1 to 1 time with Social worker, Explore available resources and support systems, Assess for adequacy in community support network, Educate family and significant other(s) on suicide prevention, Complete Psychosocial Assessment, Interpersonal group therapy.  Evaluation of Outcomes: Not Progressing   Progress in Treatment: Attending groups: attended some groups Participating in groups: Yes. Taking medication as prescribed: Yes. Toleration medication: Yes. Family/Significant other contact made: No, will contact:  Yarissa Reining [phone] Patient understands diagnosis: Yes. Discussing patient identified problems/goals with staff: Yes. Medical problems stabilized or  resolved: Yes. Denies suicidal/homicidal ideation: Yes. Issues/concerns per patient self-inventory: No.  New problem(s) identified:  No  New Short Term/Long Term Goal(s):    medication stabilization, elimination of SI thoughts, development of comprehensive mental wellness plan.    Patient Goals:  I want to go home.    Discharge Plan or Barriers:  Patient recently admitted. CSW will continue to follow and assess for appropriate referrals and possible discharge planning.    Reason for Continuation of Hospitalization: Anxiety Medication stabilization Suicidal ideation  Estimated Length of Stay:  5 - 7 days  Last 3 [place] Suicide Severity Risk Score: Flowsheet Row Ad[facility] (Current) [facility] from [facility]/02/2024 in [facility] ED from 02/01/2024 in [facility]  C-SSRS RISK CATEGORY Moderate Risk Error: Q2 is Yes, you must answer 3, 4, and 5 No Risk    Last PHQ 2/9 Scores:    02/02/2024    7:54 PM 01/17/2023    2:07 PM 12/10/2022    2:19 PM  Depression screen PHQ 2/9  Decreased Interest 2  0 2  Down, Depressed, Hopeless 2 0 2  PHQ - 2 Score 4 0 4  Altered sleeping 1 0 2  Tired, decreased energy 1 0 2  Change in appetite 1 0 1  Feeling bad or failure about yourself  2 0 1  Trouble concentrating 2 0 1  Moving slowly or fidgety/restless 1 0 1  Suicidal thoughts 2 0 0  PHQ-9 Score 14 0  12   Difficult doing work/chores Very difficult Not difficult at all Somewhat difficult     Data saved with a previous flowsheet row definition    Scribe for Treatment Team: Menaal Russum O Brylea Pita, LCSWA 02/05/2024 2:33 PM

## 2024-02-05 NOTE — Plan of Care (Signed)
  Problem: Education: Goal: Emotional status will improve Outcome: Progressing Goal: Mental status will improve Outcome: Progressing   Problem: Activity: Goal: Interest or engagement in activities will improve Outcome: Not Progressing   

## 2024-02-05 NOTE — Progress Notes (Signed)
 MHT reported pulse of 121, pulse rechecked manual at 78, pt is asymptomatic

## 2024-02-05 NOTE — Progress Notes (Signed)
   02/05/24 1000  Psych Admission Type (Psych Patients Only)  Admission Status Involuntary  Psychosocial Assessment  Patient Complaints Anxiety;Depression  Eye Contact Fair  Facial Expression Animated  Affect Appropriate to circumstance  Speech Logical/coherent  Interaction Assertive  Motor Activity Slow  Appearance/Hygiene Disheveled  Behavior Characteristics Cooperative  Mood Anxious  Thought Process  Coherency WDL  Content WDL  Delusions None reported or observed  Perception WDL  Hallucination None reported or observed  Judgment Impaired  Confusion None  Danger to Self  Current suicidal ideation? Denies  Description of Suicide Plan No Place  Self-Injurious Behavior No self-injurious ideation or behavior indicators observed or expressed   Agreement Not to Harm Self Yes  Description of Agreement Verbal  Danger to Others  Danger to Others None reported or observed

## 2024-02-05 NOTE — [facility] Group Notes (Signed)
 Psychoeducational Group Note  Date:  02/05/2024 Time:  2000  Group Topic/Focus:  Narcotics Anonymous Meeting  Participation Level: Did Not Attend  Participation Quality:  Not Applicable  Affect:  Not Applicable  Cognitive:  Not Applicable  Insight:  Not Applicable  Engagement in Group: Not Applicable  Additional Comments:  Did not attend.   Courtney [name] 02/05/2024, 9:59 PM

## 2024-02-05 NOTE — Group Note (Signed)
 Date:  02/05/2024 Time:  12:59 PM  Group Topic/Focus:  Pharmacy group     Participation Level:  Active  Courtney [name] 02/05/2024, 12:59 PM
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
## 2024-02-06 NOTE — Group Note (Signed)
 LCSW Group Therapy Note   Group Date: 02/06/2024 Start Time: 1100 End Time: 1200   Participation:  did not attend  Type of Therapy:  Group Therapy  Topic:  Money Matters: Ecologist, Confidence and Peace of Mind  Objective: To help participants understand the impact of financial stability on well-being through the lens of Maslow's Hierarchy of Needs and develop practical strategies for budgeting, saving, and debt repayment.  Goals: Increase awareness of spending habits and financial priorities, recognizing how money supports basic needs, security, and relationships. Develop simple budgeting and saving strategies to enhance stability and peace of mind.  Reduce financial stress by creating a realistic debt repayment plan, supporting long-term confidence and well-being.  Summary:  Participants explored how financial stability connects to basic needs, relationships, and self-esteem using Maslow's Hierarchy. They discussed budgeting, saving, and debt repayment strategies, identifying small, manageable changes. Through interactive discussion and self-reflection, they gained insight into their financial habits and created personal action steps for improvement.  Therapeutic Modalities Used: Elements of Cognitive Behavioral Therapy (CBT) - Addressing financial stress and thought patterns. Psychoeducation - Engineer, agricultural. Elements of Motivational Interviewing (MI) - Encouraging realistic, achievable changes. Group Support - Reducing shame and stress through shared experiences.   Augie Vane O Marisol Glazer, LCSWA 02/06/2024  12:29 PM

## 2024-02-06 NOTE — BH Assessment (Addendum)
(  Sleep Hours) - 14.75 (Any PRNs that were needed, meds refused, or side effects to meds)-  (Any disturbances and when (visitation, over night)- None (Concerns raised by the patient)- None (SI/HI/AVH)- Denies

## 2024-02-06 NOTE — Group Note (Signed)
 Date:  02/06/2024 Time:  7:13 PM  Group Topic/Focus: Emotional Wellness  Emotional Education:   The focus of this group is to discuss what feelings/emotions are, and how they are experienced.   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 Patients then participated in an Emotion Bingo activity, where they were provided bingo cards listing various emotions. Emotions were called at random, and patients selected emotions to discuss how each feeling affects them and the actions or behaviors linked to that emotion. A gold star was awarded for bingo completion, and prompting questions such as "Have you ever experienced this emotion?" were used to encourage reflection and sharing. The group discussion highlighted the universality of emotions, the differences in how they may appear, and the importance of giving grace to others. In conclusion, patients were reminded that naming emotions increases awareness and provides the power to control how emotions are expressed.   Participation Level:  Did Not Attend   Tinnie LOISE Jim 02/06/2024, 7:13 PM

## 2024-02-06 NOTE — Plan of Care (Signed)
   Problem: Education: Goal: Knowledge of [place] [facility] Education information/materials will improve Outcome: Progressing Goal: Emotional status will improve Outcome: Progressing Goal: Mental status will improve Outcome: Progressing Goal: Verbalization of understanding the information provided will improve Outcome: Progressing

## 2024-02-06 NOTE — BHH Group Notes (Signed)
 Adult Psychoeducational Group Note  Date:  02/06/2024 Time:  8:42 PM  Group Topic/Focus:  Wrap-Up Group:   The focus of this group is to help patients review their daily goal of treatment and discuss progress on daily workbooks.  Participation Level:  Active  Participation Quality:  Appropriate  Affect:  Appropriate  Cognitive:  Appropriate  Insight: Appropriate  Engagement in Group:  Engaged  Modes of Intervention:  Discussion  Additional Comments:  Courtney [name] said her day was 8. Her goal talk to people. Coping skills talking things out. Favorite part of the day seeing her mom.   Lang Drilling Long 02/06/2024, 8:42 PM

## 2024-02-06 NOTE — [facility] Group Notes (Signed)
 Robi did not attend wrap up group. Leighla remain in bed and slept

## 2024-02-06 NOTE — Plan of Care (Signed)

## 2024-02-06 NOTE — Group Note (Signed)
 Date:  02/06/2024 Time:  10:49 AM  Group Topic/Focus:  Goals Group:   The focus of this group is to help patients establish daily goals to achieve during treatment and discuss how the patient can incorporate goal setting into their daily lives to aide in recovery.    Participation Level:  Did Not Attend  Participation Quality:  Did Not Attend  Affect:  Did Not Attend  Cognitive:  Did Not Attend  Insight: None  Engagement in Group:  Did Not Attend  Modes of Intervention:  Did Not Attend  Additional Comments:  Did Not Attend  Courtney [name] 02/06/2024, 10:49 AM

## 2024-02-06 NOTE — Group Note (Signed)
 Date:  02/06/2024 Time:  3:47 PM  Group Topic/Focus: Occupational therapy Healthy Communication:   The focus of this group is to discuss communication, barriers to communication, as well as healthy ways to communicate with others.    Participation Level:  Did Not Attend  Courtney [name] 02/06/2024, 3:47 PM

## 2024-02-07 DIAGNOSIS — F3181 Bipolar II disorder: Principal | ICD-10-CM

## 2024-02-07 NOTE — Group Note (Signed)
 Date:  02/07/2024 Time:  12:58 PM  Group Topic/Focus: Social Wellness Fostering connection, teamwork, communication, and stress-relief through a fun and engaging group activity. Enhance Social Connection: Encourage participants to interact, communicate, and bond in a fun, low-pressure environment. Promote Teamwork and Collaboration: Jerrye cooperative problem-solving as players work together to united stationers and support their teammates. Boost Communication Skills: Improve non-verbal communication, creativity, and active listening through drawing and interpreting illustrations. Increase Engagement and Participation: Provide an enjoyable activity that motivates all group members to participate regardless of drawing ability. Encourage Stress Relief and Playfulness: Create a relaxed, enjoyable atmosphere that supports emotional well-being through laughter and play. Strengthen Group Cohesion: Build trust and camaraderie, helping members feel more comfortable and connected within the group.  Participation Level:  Active  Participation Quality:  Appropriate  Affect:  Appropriate  Cognitive:  Appropriate  Insight: Appropriate  Engagement in Group:  Developing/Improving  Modes of Intervention:  Activity  Additional Comments:  N/A  Courtney [name] 02/07/2024, 12:58 PM

## 2024-02-07 NOTE — Group Note (Signed)
 Date:  02/07/2024 Time:  7:33 PM  Group Topic/Focus:  Gut flora and it's impact on behavioral health    Participation Level:  Did Not Attend   Courtney [name] 02/07/2024, 7:33 PM

## 2024-02-07 NOTE — Group Note (Signed)
 Date:  02/07/2024 Time:  9:06 PM  Group Topic/Focus:  Alcoholics Anonymous (AA) Meeting    Participation Level:  Active  Participation Quality:  Appropriate  Affect:  Appropriate  Cognitive:  Appropriate  Insight: Appropriate  Engagement in Group:  Engaged  Modes of Intervention:  Discussion, Socialization, and Support  Additional Comments:  Patient attended AA  Eward Mace 02/07/2024, 9:06 PM

## 2024-02-07 NOTE — Group Note (Unsigned)
 Date:  02/07/2024 Time:  8:26 PM  Group Topic/Focus:  Wrap-Up Group:   The focus of this group is to help patients review their daily goal of treatment and discuss progress on daily workbooks.     Participation Level:  {BHH PARTICIPATION OZCZO:77735}  Participation Quality:  {BHH PARTICIPATION QUALITY:22265}  Affect:  {BHH AFFECT:22266}  Cognitive:  {BHH COGNITIVE:22267}  Insight: {BHH Insight2:20797}  Engagement in Group:  {BHH ENGAGEMENT IN HMNLE:77731}  Modes of Intervention:  {BHH MODES OF INTERVENTION:22269}  Additional Comments:  ***  Gwenn Nobie [place] 02/07/2024, 8:26 PM

## 2024-02-07 NOTE — Progress Notes (Signed)
 Shift Note  (Sleep Hours) - 8.75  (Any PRNs that were needed, meds refused, or side effects to meds)-   PRN Trazodone : Insomnia   PRN Hydroxyzine : Anxiety   (Any disturbances and when (visitation, over night)- None  (Concerns raised by the patient)- None  (SI/HI/AVH)- Denies

## 2024-02-07 NOTE — Group Note (Signed)
 Date:  02/07/2024 Time:  10:40 AM  Group Topic/Focus: Recreational Therapy    Pt did not attend recreational therapy group  Courtney [name] R Milen Lengacher 02/07/2024, 10:40 AM

## 2024-02-07 NOTE — Group Note (Signed)
 Recreation Therapy Group Note   Group Topic:Communication  Group Date: 02/07/2024 Start Time: 0933 End Time: 1025 Facilitators: Courtney [name], LRT,CTRS Location: [address] Dayroom   Group Topic: Communication, Problem Solving   Goal Area(s) Addresses:  Patient will effectively listen to complete activity.  Patient will identify communication skills used to make activity successful.  Patient will identify how skills used during activity can be used to reach post d/c goals.    Behavioral Response:    Intervention: Building Surveyor Activity - Geometric pattern cards, pencils, blank paper    Activity: Geometric Drawings.  Three volunteers from the peer group will be shown an abstract picture with a particular arrangement of geometrical shapes.  Each round, one 'speaker' will describe the pattern, as accurately as possible without revealing the image to the group.  The remaining group members will listen and draw the picture to reflect how it is described to them. Patients with the role of 'listener' cannot ask clarifying questions but, may request that the speaker repeat a direction. Once the drawings are complete, the presenter will show the rest of the group the picture and compare how close each person came to drawing the picture. LRT will facilitate a post-activity discussion regarding effective communication and the importance of planning, listening, and asking for clarification in daily interactions with others.  Education: Environmental consultant, Active listening, Support systems, Discharge planning  Education Outcome: Acknowledges understanding/In group clarification offered/Needs additional education.    Affect/Mood: N/A   Participation Level: Did not attend    Clinical Observations/Individualized Feedback:      Plan: Continue to engage patient in RT group sessions 2-3x/week.   Courtney [name], LRT,CTRS 02/07/2024 12:56 PM

## 2024-02-07 NOTE — Group Note (Signed)
 Date:  02/07/2024 Time:  9:54 AM  Group Topic/Focus:  Goals Group:   The focus of this group is to help patients establish daily goals to achieve during treatment and discuss how the patient can incorporate goal setting into their daily lives to aide in recovery. Orientation:   The focus of this group is to educate the patient on the purpose and policies of crisis stabilization and provide a format to answer questions about their admission.  The group details unit policies and expectations of patients while admitted.    Participation Level:  Did Not Attend   Courtney [name] 02/07/2024, 9:54 AM

## 2024-02-07 NOTE — Plan of Care (Signed)

## 2024-02-07 NOTE — Progress Notes (Signed)
   02/07/24 1200  Psych Admission Type (Psych Patients Only)  Admission Status Involuntary  Psychosocial Assessment  Patient Complaints Anxiety  Eye Contact Fair  Facial Expression Animated;Anxious  Affect Appropriate to circumstance  Speech Logical/coherent  Interaction Assertive  Motor Activity Other (Comment)  Appearance/Hygiene Unremarkable  Behavior Characteristics Cooperative  Mood Anxious  Thought Process  Coherency WDL  Content WDL  Delusions None reported or observed  Perception WDL  Hallucination None reported or observed  Judgment Poor  Confusion None  Danger to Self  Current suicidal ideation? Denies  Danger to Others  Danger to Others None reported or observed
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
## 2024-02-08 NOTE — Group Note (Signed)
 Date:  02/08/2024 Time:  11:35 AM  Group Topic/Focus: Social Work    Pt did not attend social work group.   Jorgeluis Gurganus R Olubunmi Rothenberger 02/08/2024, 11:35 AM

## 2024-02-08 NOTE — Group Note (Signed)
 Date:  02/08/2024 Time:  10:26 AM  Group Topic/Focus:  Dimensions of Wellness:   The focus of this group is to introduce the topic of wellness and discuss the role each dimension of wellness plays in total health.    Participation Level:  Did Not Attend   Arletha Marschke A Dorwin Fitzhenry 02/08/2024, 10:26 AM

## 2024-02-08 NOTE — Group Note (Signed)
 Date:  02/08/2024 Time:  6:04 PM  Group Topic/Focus:  Getting ready for change. Stages of change.    Participation Level:  Did Not Attend  Courtney [name] 02/08/2024, 6:04 PM

## 2024-02-08 NOTE — Plan of Care (Signed)
   Problem: Education: Goal: Emotional status will improve Outcome: Progressing Goal: Mental status will improve Outcome: Progressing

## 2024-02-08 NOTE — Group Note (Signed)
 Date:  02/08/2024 Time:  9:11 PM  Group Topic/Focus:  Wrap-Up Group:   The focus of this group is to help patients review their daily goal of treatment and discuss progress on daily workbooks.    Participation Level:  Active  Participation Quality:  Appropriate and Sharing  Affect:  Appropriate  Cognitive:  Appropriate  Insight: Appropriate  Engagement in Group:  Engaged  Modes of Intervention:  Activity and Socialization  Additional Comments:  Patient completed wrap-up group sheets and particiapted in activity. Patient stated that she is feeling, good right now. Patient rated her day a 10. Patient stated high point from today, learning that I may be going home tomorrow or monday. Patient shared things that she had leaned while being here.   Eward Mace 02/08/2024, 9:11 PM
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
## 2024-02-08 NOTE — Plan of Care (Signed)
   Problem: Education: Goal: Knowledge of [facility] Education information/materials will improve Outcome: Progressing Goal: Emotional status will improve Outcome: Progressing Goal: Mental status will improve Outcome: Progressing

## 2024-02-08 NOTE — Group Note (Signed)
 [facility] LCSW Group Therapy Note   Group Date: 02/08/2024 Start Time: 1000 End Time: 1125   Type of Therapy and Topic: Group Therapy: Avoiding Self-Sabotaging and Enabling Behaviors  Participation Level: Active  Mood:  Description of Group:  In this group, patients will learn how to identify obstacles, self-sabotaging and enabling behaviors, as well as: what are they, why do we do them and what needs these behaviors meet. Discuss unhealthy relationships and how to have positive healthy boundaries with those that sabotage and enable. Explore aspects of self-sabotage and enabling in yourself and how to limit these self-destructive behaviors in everyday life.   Therapeutic Goals: 1. Patient will identify one obstacle that relates to self-sabotage and enabling behaviors 2. Patient will identify one personal self-sabotaging or enabling behavior they did prior to admission 3. Patient will state a plan to change the above identified behavior 4. Patient will demonstrate ability to communicate their needs through discussion and/or role play.    Summary of Patient Progress:   Pt was present and respectful during group. Pt was able to verbalize and communicate effectively regarding things in her life that she could and couldn't control. She was able to demonstrate ability to communicate their needs through discussion   Therapeutic Modalities:  Cognitive Behavioral Therapy Person-Centered Therapy Motivational Interviewing    Golda Louder, LCSW

## 2024-02-08 NOTE — Group Note (Signed)
 Date:  02/08/2024 Time:  9:11 AM  Group Topic/Focus:  Goals Group:   The focus of this group is to help patients establish daily goals to achieve during treatment and discuss how the patient can incorporate goal setting into their daily lives to aide in recovery.    Participation Level:  Did Not Attend  Courtney [name] 02/08/2024, 9:11 AM

## 2024-02-08 NOTE — Progress Notes (Signed)
 Tour of Duty:  Prentice JINNY Angle, RN, 02/08/24, Tour of Duty: 0700-1900  SI/HI/AVH: Denies  Self-Reported   Mood: Neutral  Anxiety: Denies, but Observable Depression: Denies Irritability: Denies  Broset  Violence Prevention Guidelines *See Row Information*: Small Violence Risk interventions implemented   LBM  Last BM Date : 02/07/24   Pain: not present  Patient Refusals (including Rx): No  >>Shift Summary: Patient observed to be mildly anxious on unit. Patient able to make needs known. Patient observed to engage appropriately with staff and peers, but minimal initiation. Patient taking medications as prescribed. This shift, no PRN medication requested or required. No reported or observed side effects to medication. No reported or observed agitation, aggression, or other acute emotional distress. No reported or observed physical abnormalities or concerns.  Last Vitals  Vitals Weight: 70.3 kg Temp: 97.7 F (36.5 C) Temp Source: Oral Pulse Rate: (!) 101 Resp: 16 BP: 95/69 Patient Position: (not recorded)  Admission Type  Psych Admission Type (Psych Patients Only) Admission Status: Involuntary Date 72 hour document signed : (not recorded) Time 72 hour document signed : (not recorded) Provider Notified (First and Last Name) (see details for LINK to note): (not recorded)   Psychosocial Assessment  Psychosocial Assessment Patient Complaints: None Eye Contact: Fair Facial Expression: Anxious Affect: Anxious Speech: Logical/coherent Interaction: Assertive Motor Activity: Other (Comment) (WDL) Appearance/Hygiene: Unremarkable Behavior Characteristics: Appropriate to situation Mood: Anxious   Aggressive Behavior  Targets: (not recorded)   Thought Process  Thought Process Coherency: Within Defined Limits Content: Within Defined Limits Delusions: None reported or observed Perception: Within Defined Limits Hallucination: None reported or observed Judgment:  Poor Confusion: None  Danger to Self/Others  Danger to Self Current suicidal ideation?: Denies Description of Suicide Plan: (not recorded) Self-Injurious Behavior: (not recorded) Agreement Not to Harm Self: (not recorded) Description of Agreement: (not recorded) Danger to Others: None reported or observed

## 2024-02-09 MED ORDER — TRAZODONE HCL 50 MG PO TABS: 50.0000 mg | ORAL_TABLET | Freq: Every evening | ORAL | 0 refills | Status: AC | PRN

## 2024-02-09 MED ORDER — VENLAFAXINE HCL ER 37.5 MG PO CP24: 37.5000 mg | ORAL_CAPSULE | Freq: Every day | ORAL | 0 refills | Status: AC

## 2024-02-09 MED ORDER — ARIPIPRAZOLE 5 MG PO TABS: 5.0000 mg | ORAL_TABLET | Freq: Every day | ORAL | 0 refills | Status: AC

## 2024-02-09 MED ORDER — AMOXICILLIN-POT CLAVULANATE 875-125 MG PO TABS: 1.0000 | ORAL_TABLET | Freq: Two times a day (BID) | ORAL | 0 refills | Status: AC

## 2024-02-09 MED ORDER — HYDROXYZINE HCL 25 MG PO TABS: 25.0000 mg | ORAL_TABLET | Freq: Three times a day (TID) | ORAL | 0 refills | Status: AC | PRN

## 2024-02-09 NOTE — Progress Notes (Signed)
(  Sleep Hours) -7.0 as of 0530 (Any PRNs that were needed, meds refused, or side effects to meds)- prn hydroxyzine  and trazodone  @ 2107 (Any disturbances and when (visitation, over night)-none (Concerns raised by the patient)- none (SI/HI/AVH)- denies all

## 2024-02-09 NOTE — Discharge Summary (Signed)
 Physician Discharge Summary Note Patient:  Courtney [name] is an 33 y.o., female MRN:  [id number] DOB:  07-21-1990 Patient phone:  [phone] (home)  Patient address:   [address] [place] [address],  Total Time spent with patient: 1 hour  Date of Admission:  02/03/2024 Date of Discharge: 02/09/24  Reason for Admission:  Courtney [name] is a 33 year old female with a past psychiatric history significant for Bipolar II disorder, insomnia, ADHD and substance use disorder. She was admitted to the [facility] under IVC following evaluation at the [facility], where she was brought in by law enforcement due to reported suicidal ideation after an altercation with her mother. The patient has reportedly been noncompliant with her psychotropic medications for approximately the past two years.   Hospital Course  During the patient's hospitalization, patient had extensive initial psychiatric evaluation, and follow-up psychiatric evaluations every day.  Psychiatric diagnoses provided upon initial assessment:  Type 2 Bipolar Disorder GAD  Patient's psychiatric medications were adjusted on admission:  -- Restart Abilify  5 mg oral daily, mood stability -- Restart Effexor  37.5 mg oral, depression, anxiety, migraines (12/3)  -- Hydroxyzine  25 mg oral, 3 times daily as needed, anxiety -- Trazodone  50 mg, oral, daily at bedtime as needed, insomnia  During the hospitalization, other adjustments were made to the patient's psychiatric medication regimen: none  Patient's care was discussed during the interdisciplinary team meeting every day during the hospitalization.  The patient denies having side effects to prescribed psychiatric medication.  Gradually, patient started adjusting to milieu. The patient was evaluated each day by a clinical provider to ascertain response to treatment. Improvement was noted by the patient's report of decreasing  symptoms, improved sleep and appetite, affect, medication tolerance, behavior, and participation in unit programming.  Patient was asked each day to complete a self inventory noting mood, mental status, pain, new symptoms, anxiety and concerns.    Symptoms were reported as significantly decreased or resolved completely by discharge.   On day of discharge, the patient reports that their mood is stable. The patient denied having suicidal thoughts for more than 48 hours prior to discharge.  Patient denies having homicidal thoughts.  Patient denies having auditory hallucinations.  Patient denies any visual hallucinations or other symptoms of psychosis. The patient was motivated to continue taking medication with a goal of continued improvement in mental health.   The patient reports their target psychiatric symptoms of depression responded well to the psychiatric medications, and the patient reports overall benefit other psychiatric hospitalization. Supportive psychotherapy was provided to the patient. The patient also participated in regular group therapy while hospitalized. Coping skills, problem solving as well as relaxation therapies were also part of the unit programming.  Labs were reviewed with the patient, and abnormal results were discussed with the patient.  The patient is able to verbalize their individual safety plan to this provider.  # It is recommended to the patient to continue psychiatric medications as prescribed, after discharge from the hospital.    # It is recommended to the patient to follow up with your outpatient psychiatric provider and PCP.  # It was discussed with the patient, the impact of alcohol, drugs, tobacco have been there overall psychiatric and medical wellbeing, and total abstinence from substance use was recommended the patient.ed.  # Prescriptions provided or sent directly to preferred pharmacy at discharge. Patient agreeable to plan. Given opportunity to ask  questions. Appears to feel comfortable with discharge.    #  In the event of worsening symptoms, the patient is instructed to call the crisis hotline, 911 and or go to the nearest ED for appropriate evaluation and treatment of symptoms. To follow-up with primary care provider for other medical issues, concerns and or health care needs  # Patient was discharged home with a plan to follow up as noted below.    Principal Problem: Bipolar 2 disorder, major depressive episode [facility]) Discharge Diagnoses: Principal Problem:   Bipolar 2 disorder, major depressive episode (HCC) Active Problems:   Insomnia   GAD (generalized anxiety disorder)   UTI (urinary tract infection)   Otitis media    Past Medical History:  Past Medical History:  Diagnosis Date   ADHD (attention deficit hyperactivity disorder)    Bipolar disorder (HCC)    Body piercing    Nose, frenulum, nipples   Chronic tonsillitis 08/2013   current strep, will finish antibiotic 09/04/2013; snores during sleep, mother denies apnea   Conductive hearing loss of left ear    Heart murmur    when I was born; outgrew it; I was a preemie   Hidradenitis    Hidradenitis 2017   Hx of substance abuse (HCC)    Migraine 12/19/2016   I've just had this one for 3 days (12/20/2016)   Pre-diabetes    Pulmonary emboli (HCC) 07/02/2021   BL   Septic shock (HCC) 07/02/2021    Past Surgical History:  Procedure Laterality Date   ADENOIDECTOMY N/A 10/15/2022   Procedure: ADENOIDECTOMY;  Surgeon: Jesus Oliphant, MD;  Location: [facility];  Service: ENT;  Laterality: N/A;   AXILLARY HIDRADENITIS EXCISION     AXILLARY LYMPH NODE BIOPSY Left 01/06/2019   CARPOMETACARPAL ([facility]) FUSION OF THUMB Right 08/06/2023   Procedure: CARPOMETACARPAL [facility]) FUSION OF THUMB;  Surgeon: Kathlynn Sharper, MD;  Location: [facility];  Service: Orthopedics;  Laterality: Right;  Per Dr. Kathlynn, a carpometacarpal arthroplasty, but no bone removed    CYSTOSCOPY W/ URETERAL STENT PLACEMENT Right 05/28/2017   Procedure: CYSTOSCOPY WITH RETROGRADE PYELOGRAM/ RIGHT URETERAL STENT PLACEMENT;  Surgeon: Cam Morene ORN, MD;  Location: [facility];  Service: Urology;  Laterality: Right;   DORSAL COMPARTMENT RELEASE Right 08/06/2023   Procedure: RELEASE, FIRST DORSAL COMPARTMENT, HAND;  Surgeon: Kathlynn Sharper, MD;  Location: [facility];  Service: Orthopedics;  Laterality: Right;   HYDRADENITIS EXCISION Bilateral 04/03/2017   Procedure: EXCISION AND DRAINAGE BILATERAL  HIDRADENITIS AXILLA;  Surgeon: Curvin Deward MOULD, MD;  Location: [facility] OR;  Service: General;  Laterality: Bilateral;   INCISION AND DRAINAGE ABSCESS Right 07/20/2016   Procedure: INCISION AND DRAINAGE ABSCESS right axilla;  Surgeon: Vernetta Berg, MD;  Location: [facility] OR;  Service: General;  Laterality: Right;   INTRAUTERINE DEVICE (IUD) INSERTION  11/2016   had the one in my left arm removed   IRRIGATION AND DEBRIDEMENT ABSCESS Right 12/21/2016   Procedure: IRRIGATION AND DEBRIDEMENT RIGHT AXILLARY HIDRADENITIS;  Surgeon: Teresa Lonni HERO, MD;  Location: [facility] OR;  Service: General;  Laterality: Right;   IRRIGATION AND DEBRIDEMENT ABSCESS Left 01/08/2017   Procedure: IRRIGATION AND DEBRIDEMENT AXILLARY ABSCESS;  Surgeon: Curvin Deward MOULD, MD;  Location: [facility];  Service: General;  Laterality: Left;   NASAL SEPTOPLASTY W/ TURBINOPLASTY Bilateral 02/25/2019   Procedure: NASAL SEPTOPLASTY WITH TURBINATE REDUCTION;  Surgeon: Jesus Oliphant, MD;  Location:  SURGERY CENTER;  Service: ENT;  Laterality: Bilateral;   TONSILLECTOMY Bilateral 09/14/2013   Procedure: BILATERAL TONSILLECTOMY;  Surgeon: Oliphant Jesus, MD;  Location: [facility];  Service: ENT;  Laterality: Bilateral;   TYMPANOPLASTY Bilateral    rebuilt eardrums   WISDOM TOOTH EXTRACTION      Family History:  Family History  Problem Relation Age of Onset   Hypertension Mother    Hyperlipidemia Mother     Diabetes Mother    Hyperlipidemia Father    Heart attack Father 89   Alcohol abuse Paternal Uncle    Parkinson's disease Maternal Grandmother    Dementia Maternal Grandmother    Diabetes Maternal Grandmother    Bell's palsy Maternal Grandmother     Social History:  Social History   Socioeconomic History   Marital status: Single    Spouse name: Not on file   Number of children: Not on file   Years of education: EMS/Fire training   Highest education level: Not on file  Occupational History   Not on file  Tobacco Use   Smoking status: Former    Current packs/day: 0.00    Average packs/day: 1 pack/day for 13.3 years (13.3 ttl pk-yrs)    Types: Cigarettes    Start date: 2010    Quit date: 07/03/2021    Years since quitting: 2.6   Smokeless tobacco: Never   Tobacco comments:    May 2023, switched  from smoking to vaping  Vaping Use   Vaping status: Some Days   Last attempt to quit: 07/03/2021   Substances: Flavoring   Devices: Various - disposable  Substance and Sexual Activity   Alcohol use: Yes    Comment: once a month   Drug use: Not Currently    Types: Methamphetamines, Cocaine    Comment: opioids, pt reports occassionally smoking marijuana   Sexual activity: Yes    Birth control/protection: Pill, Condom  Other Topics Concern   Not on file  Social History Narrative   02/11/19   From: the area   Living: with Mom currently, working to find her own place   Work: EMS/Fire      Family: good relationship with Mom and Grandma      Enjoys: spend time with friends/family, hunting, bomb fire      Exercise: every shift does training - 2-3 times a week   Diet: well rounded, meat heavy      Safety   Seat belts: Yes    Guns: Yes  and secure   Safe in relationships: Yes    Helmet: no    Social Drivers of Corporate Investment Banker Strain: Low Risk  (10/04/2023)   Received from [facility]   Overall Financial Resource Strain (CARDIA)    Difficulty of  Paying Living Expenses: Not hard at all  Food Insecurity: No Food Insecurity (02/03/2024)   Hunger Vital Sign    Worried About Running Out of Food in the Last Year: Never true    Ran Out of Food in the Last Year: Never true  Transportation Needs: No Transportation Needs (02/03/2024)   PRAPARE - Administrator, Civil Service (Medical): No    Lack of Transportation (Non-Medical): No  Physical Activity: Not on file  Stress: Not on file  Social Connections: Unknown (07/02/2021)   Received from [facility]   Social Network    Social Network: Not on file  Intimate Partner Violence: Not At Risk (02/03/2024)   Humiliation, Afraid, Rape, and Kick questionnaire    Fear of Current or Ex-Partner: No    Emotionally Abused: No    Physically Abused: No  Sexually Abused: No     Physical Findings: AIMS:  , ,  ,  ,    CIWA:    COWS:     Musculoskeletal: Strength & Muscle Tone: within normal limits Gait & Station: normal Patient leans: N/A  Psychiatric Specialty Exam  Presentation  General Appearance: Appropriate for Environment; Casual  Eye Contact:Good  Speech:Clear and Coherent; Normal Rate  Speech Volume:Normal   Mood and Affect  Mood:Euthymic  Affect:Appropriate; Congruent   Thought Process  Thought Processes:Coherent; Goal Directed; Linear  Descriptions of Associations:Intact  Orientation:Full (Time, Place and Person)  Thought Content:Logical  Hallucinations:Hallucinations: None  Ideas of Reference:None  Suicidal Thoughts:Suicidal Thoughts: No  Homicidal Thoughts:Homicidal Thoughts: No   Sensorium  Memory:Immediate Good; Recent Good; Remote Good  Judgment:Fair  Insight:Fair   Executive Functions  Concentration:Good  Attention Span:Good  Recall:Good  Fund of Knowledge:Good  Language:Good   Psychomotor Activity  Psychomotor Activity:Psychomotor Activity: Normal   Assets  Assets:Communication Skills; Desire for Improvement;  Physical Health   Sleep  Sleep:Sleep: Good   Physical Exam: Physical Exam ROS Blood pressure 106/71, pulse 86, temperature 97.9 F (36.6 C), temperature source Oral, resp. rate 16, height 5' 1 (1.549 m), weight 70.3 kg, SpO2 100%. Body mass index is 29.28 kg/m.  Social History   Tobacco Use  Smoking Status Former   Current packs/day: 0.00   Average packs/day: 1 pack/day for 13.3 years (13.3 ttl pk-yrs)   Types: Cigarettes   Start date: 2010   Quit date: 07/03/2021   Years since quitting: 2.6  Smokeless Tobacco Never  Tobacco Comments   May 2023, switched  from smoking to vaping   Tobacco Cessation:  N/A, patient does not currently use tobacco products  Blood Alcohol level:  Lab Results  Component Value Date   [facility] <15 02/02/2024   ETH <10 01/04/2023    Metabolic Disorder Labs:  Lab Results  Component Value Date   HGBA1C 5.3 02/02/2024   MPG 105 02/02/2024   No results found for: PROLACTIN Lab Results  Component Value Date   CHOL 143 02/02/2024   TRIG 102 02/02/2024   HDL 43 02/02/2024   CHOLHDL 3.3 02/02/2024   VLDL 20 02/02/2024   LDLCALC 80 02/02/2024   LDLCALC 102 (H) 12/10/2022    See Psychiatric Specialty Exam and Suicide Risk Assessment completed by Attending Physician prior to discharge.  Discharge destination:  Home  Is patient on multiple antipsychotic therapies at discharge:  No   Has Patient had three or more failed trials of antipsychotic monotherapy by history:  No  Recommended Plan for Multiple Antipsychotic Therapies: NA  Discharge Instructions     Diet - low sodium heart healthy   Complete by: As directed    Increase activity slowly   Complete by: As directed       Allergies as of 02/09/2024       Reactions   Ginger Anaphylaxis, Hives   Iodine Hives, Shortness Of Breath, Nausea Only   Other Hives, Other (See Comments)   ABD pad causes rash - Pt can only tolerate cloth tape   Shellfish Allergy Anaphylaxis, Hives, Shortness  Of Breath   Tape Hives, Other (See Comments)   Pt can only tolerate cloth tape (07/30/23 - tape issues ONLY if left on for extended period.)   Bactrim  [sulfamethoxazole -trimethoprim ] Other (See Comments)   Causes Oral Thrush   Morphine  And Codeine  Other (See Comments)    I become very angry   Sulfa  Antibiotics Other (See Comments)  Pt reports oral thrush   Vancomycin  Other (See Comments)   Burns her vein really bad and always causes them to blow. Also red man syndrome        Medication List     TAKE these medications      Indication  albuterol  108 (90 Base) MCG/ACT inhaler Commonly known as: VENTOLIN  HFA TAKE 2 PUFFS BY MOUTH EVERY 6 HOURS AS NEEDED FOR WHEEZE OR SHORTNESS OF BREATH What changed: See the new instructions.  Indication: Asthma   amoxicillin -clavulanate 875-125 MG tablet Commonly known as: AUGMENTIN  Take 1 tablet by mouth 2 (two) times daily for 3 days.  Indication: Simple Infection of the Urinary Tract   ARIPiprazole  5 MG tablet Commonly known as: ABILIFY  Take 1 tablet (5 mg total) by mouth daily.  Indication: MIXED BIPOLAR AFFECTIVE DISORDER   bimekizumab-bkzx 320 MG/2ML pen Commonly known as: BIMZELX Inject 320 mg into the skin every 30 (thirty) days.  Indication: Hidradenitis Suppurativa   ciprofloxacin -dexamethasone  OTIC suspension Commonly known as: CIPRODEX  Place 3 drops into the left ear 2 (two) times daily. Instill for 7 days starting on 02/01/24  Indication: Acute Infection of the Middle Ear   Emgality 120 MG/ML Soaj Generic drug: Galcanezumab-gnlm Inject 120 mg into the skin every 30 (thirty) days.  Indication: Migraine Headache   EPINEPHrine  0.3 mg/0.3 mL Soaj injection Commonly known as: EpiPen  2-Pak Inject 0.3 mg into the muscle as needed for anaphylaxis.  Indication: Life-Threatening Hypersensitivity Reaction   fluticasone  50 MCG/ACT nasal spray Commonly known as: FLONASE  Place 2 sprays into both nostrils daily.  Indication:  Hayfever   hydrOXYzine  25 MG tablet Commonly known as: ATARAX  Take 1 tablet (25 mg total) by mouth 3 (three) times daily as needed for anxiety.  Indication: Feeling Anxious   inFLIXimab -axxq in sodium chloride  0.9 % Inject 10 mg/kg into the vein every 30 (thirty) days. Patient received 700mg  at [facility] [address], [place] on 88/82/7974  Indication: Hidradenitis Suppurativa   montelukast  10 MG tablet Commonly known as: SINGULAIR  TAKE 1 TABLET BY MOUTH EVERYDAY AT BEDTIME What changed:  how much to take how to take this when to take this additional instructions  Indication: Asthma   norethindrone 0.35 MG tablet Commonly known as: MICRONOR Take 0.35 mg by mouth daily.  Indication: Birth Control Treatment   nystatin  powder Commonly known as: MYCOSTATIN /NYSTOP  Apply 1 Application topically 2 (two) times daily.  Indication: Skin Infection due to Candida Yeast   SUMAtriptan  100 MG tablet Commonly known as: IMITREX  Take 100 mg by mouth every 2 (two) hours as needed for migraine or headache.  Indication: Migraine Headache   traZODone  50 MG tablet Commonly known as: DESYREL  Take 1 tablet (50 mg total) by mouth at bedtime as needed for sleep.  Indication: Trouble Sleeping   venlafaxine  XR 37.5 MG 24 hr capsule Commonly known as: EFFEXOR -XR Take 1 capsule (37.5 mg total) by mouth daily with breakfast.  Indication: Depression        Follow-up Information     [facility] Follow up on 02/14/2024.   Why: You have a hospital follow up appointment for therapy and medication management services on 12/12 @ 10:30 am .  The appointment will be Virtual, telehealth. Contact information: [address]  [address] [place] [address] [phone]                  Follow-up recommendations:   Activity:  as tolerated Diet:  heart healthy   Comments:  Prescriptions  were given at discharge.  Patient is agreeable with the discharge plan.   Patient was given an opportunity to ask questions.  Patient appears to feel comfortable with discharge and denies any current suicidal or homicidal thoughts.    Patient is instructed prior to discharge to: Take all medications as prescribed by mental healthcare provider. Report any adverse effects and or reactions from the medicines to outpatient provider promptly. In the event of worsening symptoms, patient is instructed to call the crisis hotline, 911 and or go to the nearest ED for appropriate evaluation and treatment of symptoms. Patient is to follow-up with primary care provider for other medical issues, concerns and or health care needs.     Signed: Prentice Espy, MD 02/09/2024, 7:27 AM

## 2024-02-09 NOTE — BHH Suicide Risk Assessment (Signed)
 [facility] Discharge Suicide Risk Assessment   Principal Problem: Bipolar 2 disorder, major depressive episode (HCC) Discharge Diagnoses: Principal Problem:   Bipolar 2 disorder, major depressive episode (HCC) Active Problems:   Insomnia   GAD (generalized anxiety disorder)   UTI (urinary tract infection)   Otitis media   Reason for Admission:  Courtney [name] is a 33 year old female with a past psychiatric history significant for Bipolar II disorder, insomnia, ADHD and substance use disorder. She was admitted to the [facility] under IVC following evaluation at the [facility], where she was brought in by law enforcement due to reported suicidal ideation after an altercation with her mother. The patient has reportedly been noncompliant with her psychotropic medications for approximately the past two years.   Hospital Course  During the patient's hospitalization, patient had extensive initial psychiatric evaluation, and follow-up psychiatric evaluations every day.  Psychiatric diagnoses provided upon initial assessment:  Type 2 Bipolar Disorder GAD  Patient's psychiatric medications were adjusted on admission:  -- Restart Abilify  5 mg oral daily, mood stability -- Restart Effexor  37.5 mg oral, depression, anxiety, migraines (12/3)  -- Hydroxyzine  25 mg oral, 3 times daily as needed, anxiety -- Trazodone  50 mg, oral, daily at bedtime as needed, insomnia  During the hospitalization, other adjustments were made to the patient's psychiatric medication regimen: none  Patient's care was discussed during the interdisciplinary team meeting every day during the hospitalization.  The patient denies having side effects to prescribed psychiatric medication.  Gradually, patient started adjusting to milieu. The patient was evaluated each day by a clinical provider to ascertain response to treatment. Improvement was noted by the patient's report of decreasing  symptoms, improved sleep and appetite, affect, medication tolerance, behavior, and participation in unit programming.  Patient was asked each day to complete a self inventory noting mood, mental status, pain, new symptoms, anxiety and concerns.    Symptoms were reported as significantly decreased or resolved completely by discharge.   On day of discharge, the patient reports that their mood is stable. The patient denied having suicidal thoughts for more than 48 hours prior to discharge.  Patient denies having homicidal thoughts.  Patient denies having auditory hallucinations.  Patient denies any visual hallucinations or other symptoms of psychosis. The patient was motivated to continue taking medication with a goal of continued improvement in mental health.   The patient reports their target psychiatric symptoms of depression responded well to the psychiatric medications, and the patient reports overall benefit other psychiatric hospitalization. Supportive psychotherapy was provided to the patient. The patient also participated in regular group therapy while hospitalized. Coping skills, problem solving as well as relaxation therapies were also part of the unit programming.  Labs were reviewed with the patient, and abnormal results were discussed with the patient.  The patient is able to verbalize their individual safety plan to this provider.  # It is recommended to the patient to continue psychiatric medications as prescribed, after discharge from the hospital.    # It is recommended to the patient to follow up with your outpatient psychiatric provider and PCP.  # It was discussed with the patient, the impact of alcohol, drugs, tobacco have been there overall psychiatric and medical wellbeing, and total abstinence from substance use was recommended the patient.ed.  # Prescriptions provided or sent directly to preferred pharmacy at discharge. Patient agreeable to plan. Given opportunity to ask  questions. Appears to feel comfortable with discharge.    # In  the event of worsening symptoms, the patient is instructed to call the crisis hotline, 911 and or go to the nearest ED for appropriate evaluation and treatment of symptoms. To follow-up with primary care provider for other medical issues, concerns and or health care needs  # Patient was discharged home with a plan to follow up as noted below.   Total Time spent with patient: 1 hour  Musculoskeletal: Strength & Muscle Tone: within normal limits Gait & Station: normal Patient leans: N/A  Psychiatric Specialty Exam  Presentation  General Appearance: Appropriate for Environment; Casual   Eye Contact:Good   Speech:Clear and Coherent; Normal Rate   Speech Volume:Normal     Mood and Affect  Mood:Euthymic   Affect:Appropriate; Congruent    Thought Process  Thought Processes:Coherent; Goal Directed; Linear   Descriptions of Associations:Intact   Orientation:Full (Time, Place and Person)   Thought Content:Logical   Hallucinations:Hallucinations: None  Ideas of Reference:None   Suicidal Thoughts:Suicidal Thoughts: No  Homicidal Thoughts:Homicidal Thoughts: No   Sensorium  Memory:Immediate Good; Recent Good; Remote Good   Judgment:Fair   Insight:Fair    Executive Functions  Concentration:Good   Attention Span:Good   Recall:Good   Fund of Knowledge:Good   Language:Good    Psychomotor Activity  Psychomotor Activity:Psychomotor Activity: Normal   Assets  Assets:Communication Skills; Desire for Improvement; Physical Health    Sleep  Sleep:Sleep: Good   Blood pressure 106/71, pulse 86, temperature 97.9 F (36.6 C), temperature source Oral, resp. rate 16, height 5' 1 (1.549 m), weight 70.3 kg, SpO2 100%. Body mass index is 29.28 kg/m.  Mental Status Per Nursing Assessment::   On Admission:  NA  Demographic Factors:  Caucasian  Loss Factors: NA  Historical  Factors: Impulsivity  Risk Reduction Factors:   Living with another person, especially a relative, Positive social support, and Positive coping skills or problem solving skills  Continued Clinical Symptoms:  Depression:   Severe More than one psychiatric diagnosis Previous Psychiatric Diagnoses and Treatments  Cognitive Features That Contribute To Risk:  None    Suicide Risk:  Minimal: No identifiable suicidal ideation.  Patients presenting with no risk factors but with morbid ruminations; may be classified as minimal risk based on the severity of the depressive symptoms   Follow-up Information     [facility] Follow up on 02/14/2024.   Why: You have a hospital follow up appointment for therapy and medication management services on 12/12 @ 10:30 am .  The appointment will be Virtual, telehealth. Contact information: [address]  [address] [place] [address] [phone]                 Follow-up recommendations:   Activity:  as tolerated Diet:  heart healthy   Comments:  Prescriptions were given at discharge.  Patient is agreeable with the discharge plan.  Patient was given an opportunity to ask questions.  Patient appears to feel comfortable with discharge and denies any current suicidal or homicidal thoughts.    Patient is instructed prior to discharge to: Take all medications as prescribed by mental healthcare provider. Report any adverse effects and or reactions from the medicines to outpatient provider promptly. In the event of worsening symptoms, patient is instructed to call the crisis hotline, 911 and or go to the nearest ED for appropriate evaluation and treatment of symptoms. Patient is to follow-up with primary care provider for other medical issues, concerns and or health care needs.    Prentice Espy, MD 02/09/2024, 7:27 AM

## 2024-02-09 NOTE — Progress Notes (Signed)
  [facility] Adult Case Management Discharge Plan :  Will you be returning to the same living situation after discharge:  Yes,    At discharge, do you have transportation home?: Yes,  pt's mom will be picking her up Do you have the ability to pay for your medications: Yes,  PRIMARY INS: BLUE CROSS BLUE SHIELD / BCBS COMM PPO  Release of information consent forms completed and in the chart;  Patient's signature needed at discharge.  Patient to Follow up at:  Follow-up Information     [facility] Follow up on 02/14/2024.   Why: You have a hospital follow up appointment for therapy and medication management services on 12/12 @ 10:30 am .  The appointment will be Virtual, telehealth. Contact information: [address]  [address] [place] [address] [phone]                 Next level of care provider has access to [facility] Link:no  Safety Planning and Suicide Prevention discussed: Yes,  completed with mom  Has patient been referred to the Quitline?: Patient refused referral for treatment. Pt reported that she vape but no concerns and does not want to quit.   Patient has been referred for addiction treatment: Patient refused referral for treatment.  Golda Louder, LCSW 02/09/2024, 9:05 AM

## 2024-02-09 NOTE — Hospital Course (Signed)
 Reason for Admission:  Courtney [name] is a 33 year old female with a past psychiatric history significant for Bipolar II disorder, insomnia, ADHD and substance use disorder. She was admitted to the [facility] under IVC following evaluation at the [facility], where she was brought in by law enforcement due to reported suicidal ideation after an altercation with her mother. The patient has reportedly been noncompliant with her psychotropic medications for approximately the past two years.   Hospital Course  During the patient's hospitalization, patient had extensive initial psychiatric evaluation, and follow-up psychiatric evaluations every day.  Psychiatric diagnoses provided upon initial assessment:  Type 2 Bipolar Disorder GAD  Patient's psychiatric medications were adjusted on admission:  -- Restart Abilify  5 mg oral daily, mood stability -- Restart Effexor  37.5 mg oral, depression, anxiety, migraines (12/3)  -- Hydroxyzine  25 mg oral, 3 times daily as needed, anxiety -- Trazodone  50 mg, oral, daily at bedtime as needed, insomnia  During the hospitalization, other adjustments were made to the patient's psychiatric medication regimen: none  Patient's care was discussed during the interdisciplinary team meeting every day during the hospitalization.  The patient denies having side effects to prescribed psychiatric medication.  Gradually, patient started adjusting to milieu. The patient was evaluated each day by a clinical provider to ascertain response to treatment. Improvement was noted by the patient's report of decreasing symptoms, improved sleep and appetite, affect, medication tolerance, behavior, and participation in unit programming.  Patient was asked each day to complete a self inventory noting mood, mental status, pain, new symptoms, anxiety and concerns.    Symptoms were reported as significantly decreased or resolved completely by  discharge.   On day of discharge, the patient reports that their mood is stable. The patient denied having suicidal thoughts for more than 48 hours prior to discharge.  Patient denies having homicidal thoughts.  Patient denies having auditory hallucinations.  Patient denies any visual hallucinations or other symptoms of psychosis. The patient was motivated to continue taking medication with a goal of continued improvement in mental health.   The patient reports their target psychiatric symptoms of depression responded well to the psychiatric medications, and the patient reports overall benefit other psychiatric hospitalization. Supportive psychotherapy was provided to the patient. The patient also participated in regular group therapy while hospitalized. Coping skills, problem solving as well as relaxation therapies were also part of the unit programming.  Labs were reviewed with the patient, and abnormal results were discussed with the patient.  The patient is able to verbalize their individual safety plan to this provider.  # It is recommended to the patient to continue psychiatric medications as prescribed, after discharge from the hospital.    # It is recommended to the patient to follow up with your outpatient psychiatric provider and PCP.  # It was discussed with the patient, the impact of alcohol, drugs, tobacco have been there overall psychiatric and medical wellbeing, and total abstinence from substance use was recommended the patient.ed.  # Prescriptions provided or sent directly to preferred pharmacy at discharge. Patient agreeable to plan. Given opportunity to ask questions. Appears to feel comfortable with discharge.    # In the event of worsening symptoms, the patient is instructed to call the crisis hotline, 911 and or go to the nearest ED for appropriate evaluation and treatment of symptoms. To follow-up with primary care provider for other medical issues, concerns and or health  care needs  # Patient was discharged home with  a plan to follow up as noted below.

## 2024-02-11 ENCOUNTER — Other Ambulatory Visit (HOSPITAL_COMMUNITY): Payer: Self-pay

## 2024-02-17 ENCOUNTER — Encounter: Admit: 2024-02-17 | Discharge: 2024-02-18 | Payer: Medicaid (Managed Care)

## 2024-02-17 DIAGNOSIS — L732 Hidradenitis suppurativa: Secondary | ICD-10-CM | POA: Diagnosis not present

## 2024-02-17 MED ADMIN — inFLIXimab-axxq (AVSOLA) 10 mg/kg = 700 mg in sodium chloride (NS) 250 mL IVPB: 10 mg/kg | INTRAVENOUS | @ 16:00:00 | Stop: 2024-02-17

## 2024-02-17 MED ADMIN — acetaminophen (TYLENOL) tablet 650 mg: 650 mg | ORAL | @ 16:00:00 | Stop: 2024-02-17

## 2024-02-17 MED ADMIN — cetirizine (ZYRTEC) tablet 10 mg: 10 mg | ORAL | @ 16:00:00 | Stop: 2024-02-17

## 2024-02-17 MED ADMIN — diphenhydrAMINE (BENADRYL) capsule/tablet 25 mg: 25 mg | ORAL | @ 16:00:00 | Stop: 2024-02-17

## 2024-02-17 MED ADMIN — ondansetron (ZOFRAN) injection 8 mg: 8 mg | INTRAVENOUS | @ 16:00:00 | Stop: 2024-02-17

## 2024-02-17 NOTE — Progress Notes (Signed)
 Patient presents for standard Avsola  (infliximab -axxq) infusion.  In no acute distress. Vitals stable.  Reports no new medical issues or S/S of infection.  PIV placed.  See MAR for premeds.    1051 Avsola  (infliximab -axxq) 700 mg infusing as follows:     10ml/hr x 15 min  28ml/hr x 15 min  45ml/hr x 15 min  53ml/hr x 15 min  147ml/hr x 30 min  219ml/hr for the remainder of the infusion.    1258 Avsola  (infliximab -axxq) infusion complete.  PIV flushed with NS.  Vitals stable.  Patient without any s/s of adverse reaction.     IV d/c'd.  Patient discharged from Infusion Center.

## 2024-03-02 NOTE — Progress Notes (Signed)
 03/02/2024 - Patient originally requested delivery for 02/28/24. Delivery is not possible on this date due to holiday. I have reached out to the patient and confirmed that delivery on 03/04/24 is ok.  [facility] Pharm[facility] Refill Coordination Note    Melissa Pearson, [place]: Aug 30, 1990  Phone: [phone] (home) [phone] (work)      All above HIPAA information was verified with patient.         02/24/2024    12:50 PM   Specialty Rx Medication Refill Questionnaire   Which Medications would you like refilled and shipped? BIMZELX  Prefilled syringe   Please list all current allergies: Morphine   Have you missed any doses in the last 30 days? No   Have you had any changes to your medication(s) since your last refill? No   How much of each medication do you have remaining at home? (eg. number of tablets, injections, etc.) 0   If receiving an injectable medication, next injection date is 02/29/2024   Have you experienced any side effects in the last 30 days? No   Please enter the full address (street address, city, state, zip code) where you would like your medication(s) to be delivered to. [address], [place] [address]   Please specify on which day you would like your medication(s) to arrive. Note: if you need your medication(s) within 3 days, please call the pharmacy to schedule your order at [phone]  02/28/2024   Has your insurance changed since your last refill? No   Would you like a pharmacist to call you to discuss your medication(s)? Yes   Please enter the preferred phone number where you can be reached. [phone]   Do you require a signature for your package? (Note: if we are billing Medicare Part B or your order contains a controlled substance, we will require a signature) No   I have been provided my out of pocket cost for my medication and approve the pharmacy to charge the amount to my credit card on file. Yes         Completed refill call assessment today to schedule patient's medication shipment from the [facility] [phone]).  All relevant notes have been reviewed.       Confirmed patient received a Conservation Officer, Historic Buildings and a Surveyor, Mining with first shipment. The patient will receive a drug information handout for each medication shipped and additional FDA Medication Guides as required.         REFERRAL TO PHARMACIST     Referral to the pharmacist: Yes - patient request      SHIPPING     Shipping address confirmed in Epic.     Delivery Scheduled: Yes, Expected medication delivery date: 03/04/24.     Medication will be delivered via UPS to the prescription address in Epic WAM.    Melissa Pearson   [facility] Specialty Technician

## 2024-03-03 MED FILL — BIMZELX 320 MG/2 ML SUBCUTANEOUS SYRINGE: SUBCUTANEOUS | 28 days supply | Qty: 2 | Fill #9

## 2024-03-06 NOTE — Progress Notes (Signed)
 This pharmacist was notified by a technician that this patient would like to be contacted by the pharmacist per their MyChart questionnaire response.. I have reached out to the patient and had to leave a voicemail. I documented the reason for the call in WAM in the event the patient calls back.      Approximate time spent: 0-5 minutes    Julianne Rice, PharmD, Clinical Specialty Pharmacist  [facility]

## 2024-03-16 ENCOUNTER — Encounter: Admit: 2024-03-16 | Discharge: 2024-03-17 | Payer: BLUE CROSS/BLUE SHIELD

## 2024-03-16 LAB — CBC W/ AUTO DIFF
BASOPHILS ABSOLUTE COUNT: 0.1 10*9/L (ref 0.0–0.1)
BASOPHILS RELATIVE PERCENT: 1.1 %
EOSINOPHILS ABSOLUTE COUNT: 0.3 10*9/L (ref 0.0–0.5)
EOSINOPHILS RELATIVE PERCENT: 2.6 %
HEMATOCRIT: 38.3 % (ref 34.0–44.0)
HEMOGLOBIN: 12.7 g/dL (ref 11.3–14.9)
LYMPHOCYTES ABSOLUTE COUNT: 3.5 10*9/L (ref 1.1–3.6)
LYMPHOCYTES RELATIVE PERCENT: 33.7 %
MEAN CORPUSCULAR HEMOGLOBIN CONC: 33 g/dL (ref 32.0–36.0)
MEAN CORPUSCULAR HEMOGLOBIN: 29.4 pg (ref 25.9–32.4)
MEAN CORPUSCULAR VOLUME: 89.1 fL (ref 77.6–95.7)
MEAN PLATELET VOLUME: 8.1 fL (ref 6.8–10.7)
MONOCYTES ABSOLUTE COUNT: 1.1 10*9/L — ABNORMAL HIGH (ref 0.3–0.8)
MONOCYTES RELATIVE PERCENT: 10.4 %
NEUTROPHILS ABSOLUTE COUNT: 5.4 10*9/L (ref 1.8–7.8)
NEUTROPHILS RELATIVE PERCENT: 52.2 %
PLATELET COUNT: 331 10*9/L (ref 150–450)
RED BLOOD CELL COUNT: 4.3 10*12/L (ref 3.95–5.13)
RED CELL DISTRIBUTION WIDTH: 15 % (ref 12.2–15.2)
WBC ADJUSTED: 10.4 10*9/L (ref 3.6–11.2)

## 2024-03-16 LAB — C-REACTIVE PROTEIN: C-REACTIVE PROTEIN: 7.1 mg/L (ref <=10.0)

## 2024-03-16 LAB — AST: AST (SGOT): 11 U/L (ref <=34)

## 2024-03-16 LAB — ALT: ALT (SGPT): 10 U/L (ref 10–49)

## 2024-03-16 LAB — CREATININE
CREATININE: 0.57 mg/dL (ref 0.55–1.02)
EGFR CKD-EPI (2021) FEMALE: >90 mL/min/1.73m2 (ref >=60)

## 2024-03-16 LAB — BUN: BLOOD UREA NITROGEN: 8 mg/dL — ABNORMAL LOW (ref 9–23)

## 2024-03-16 LAB — SEDIMENTATION RATE: ERYTHROCYTE SEDIMENTATION RATE: 100 mm/h — ABNORMAL HIGH (ref 0–20)

## 2024-03-16 MED ADMIN — acetaminophen (TYLENOL) tablet 650 mg: 650 mg | ORAL | @ 16:00:00 | Stop: 2024-03-16

## 2024-03-16 MED ADMIN — cetirizine (ZYRTEC) tablet 10 mg: 10 mg | ORAL | @ 16:00:00 | Stop: 2024-03-16

## 2024-03-16 MED ADMIN — ondansetron (ZOFRAN) injection 8 mg: 8 mg | INTRAVENOUS | @ 16:00:00 | Stop: 2024-03-16

## 2024-03-16 MED ADMIN — inFLIXimab-axxq (AVSOLA) 10 mg/kg = 800 mg in sodium chloride (NS) 250 mL IVPB: 10 mg/kg | INTRAVENOUS | @ 16:00:00 | Stop: 2024-03-16

## 2024-03-16 MED ADMIN — diphenhydrAMINE (BENADRYL) capsule/tablet 25 mg: 25 mg | ORAL | @ 16:00:00 | Stop: 2024-03-16

## 2024-03-16 NOTE — Progress Notes (Signed)
 Patient presents for standard Avsola  (infliximab -axxq) infusion.  In no acute distress. Vitals stable.  Reports no new medical issues or S/S of infection.  PIV placed.  See MAR for premeds.    1121 Avsola  (infliximab -axxq) 800 mg infusing as follows:     10ml/hr x 15 min  28ml/hr x 15 min  1ml/hr x 15 min  91ml/hr x 15 min  160ml/hr x 30 min  274ml/hr for the remainder of the infusion.    1322  Avsola  (infliximab -axxq) infusion complete.  PIV flushed with NS.  Vitals stable.  Patient without any s/s of adverse reaction.    IV d/c'd.  Patient discharged from Infusion Center.

## 2024-04-06 ENCOUNTER — Other Ambulatory Visit: Payer: Self-pay

## 2024-04-06 ENCOUNTER — Emergency Department
Admission: EM | Admit: 2024-04-06 | Discharge: 2024-04-06 | Disposition: A | Discharge status: Home / Self Care | Payer: Self-pay

## 2024-04-06 ENCOUNTER — Emergency Department: Payer: Self-pay

## 2024-04-06 DIAGNOSIS — R0781 Pleurodynia: Secondary | ICD-10-CM | POA: Insufficient documentation

## 2024-04-06 DIAGNOSIS — J3489 Other specified disorders of nose and nasal sinuses: Secondary | ICD-10-CM | POA: Insufficient documentation

## 2024-04-06 DIAGNOSIS — R5383 Other fatigue: Secondary | ICD-10-CM | POA: Insufficient documentation

## 2024-04-06 DIAGNOSIS — R638 Other symptoms and signs concerning food and fluid intake: Secondary | ICD-10-CM | POA: Insufficient documentation

## 2024-04-06 DIAGNOSIS — R531 Weakness: Secondary | ICD-10-CM | POA: Insufficient documentation

## 2024-04-06 LAB — URINALYSIS, ROUTINE W REFLEX MICROSCOPIC
Bilirubin Urine: NEGATIVE
Glucose, UA: NEGATIVE mg/dL
Hgb urine dipstick: NEGATIVE
Ketones, ur: NEGATIVE mg/dL
Nitrite: NEGATIVE
Protein, ur: NEGATIVE mg/dL
Specific Gravity, Urine: 1.017 (ref 1.005–1.030)
pH: 5 (ref 5.0–8.0)

## 2024-04-06 LAB — CBC
HCT: 38.5 % (ref 36.0–46.0)
Hemoglobin: 12.3 g/dL (ref 12.0–15.0)
MCH: 29.6 pg (ref 26.0–34.0)
MCHC: 31.9 g/dL (ref 30.0–36.0)
MCV: 92.5 fL (ref 80.0–100.0)
Platelets: 287 10*3/uL (ref 150–400)
RBC: 4.16 MIL/uL (ref 3.87–5.11)
RDW: 14 % (ref 11.5–15.5)
WBC: 12.3 10*3/uL — ABNORMAL HIGH (ref 4.0–10.5)
nRBC: 0 % (ref 0.0–0.2)

## 2024-04-06 LAB — COMPREHENSIVE METABOLIC PANEL WITH GFR
ALT: 17 U/L (ref 0–44)
AST: 20 U/L (ref 15–41)
Albumin: 3.7 g/dL (ref 3.5–5.0)
Alkaline Phosphatase: 90 U/L (ref 38–126)
Anion gap: 11 (ref 5–15)
BUN: 11 mg/dL (ref 6–20)
CO2: 25 mmol/L (ref 22–32)
Calcium: 9.1 mg/dL (ref 8.9–10.3)
Chloride: 102 mmol/L (ref 98–111)
Creatinine, Ser: 0.66 mg/dL (ref 0.44–1.00)
GFR, Estimated: >60 mL/min
Glucose, Bld: 90 mg/dL (ref 70–99)
Potassium: 3.8 mmol/L (ref 3.5–5.1)
Sodium: 137 mmol/L (ref 135–145)
Total Bilirubin: 0.2 mg/dL (ref 0.0–1.2)
Total Protein: 8.2 g/dL — ABNORMAL HIGH (ref 6.5–8.1)

## 2024-04-06 LAB — RESP PANEL BY RT-PCR (RSV, FLU A&B, COVID)  RVPGX2
Influenza A by PCR: NEGATIVE
Influenza B by PCR: NEGATIVE
Resp Syncytial Virus by PCR: NEGATIVE
SARS Coronavirus 2 by RT PCR: NEGATIVE

## 2024-04-06 LAB — POC URINE PREG, ED: Preg Test, Ur: NEGATIVE

## 2024-04-06 MED ORDER — MUPIROCIN 2 % EX OINT: 1.0000 | TOPICAL_OINTMENT | Freq: Two times a day (BID) | CUTANEOUS | 0 refills | Status: AC

## 2024-04-06 NOTE — Discharge Instructions (Signed)
 Your workup today was reassuring and you are safe to return home and follow-up with your primary care physician.  Continue your regular medications.  I have sent ointment for you to your pharmacy of choice.  Please return with any acutely worsening symptoms. -- RETURN PRECAUTIONS & AFTERCARE: (ENGLISH) RETURN PRECAUTIONS: Return immediately to the emergency department or see/call your doctor if you feel worse, weak or have changes in speech or vision, are short of breath, have fever, vomiting, pain, bleeding or dark stool, trouble urinating or any new issues. Return here or see/call your doctor if not improving as expected for your suspected condition. FOLLOW-UP CARE: Call your doctor and/or any doctors we referred you to for more advice and to make an appointment. Do this today, tomorrow or after the weekend. Some doctors only take PPO insurance so if you have HMO insurance you may want to contact your HMO or your regular doctor for referral to a specialist within your plan. Either way tell the doctor's office that it was a referral from the emergency department so you get the soonest possible appointment.  YOUR TEST RESULTS: Take result reports of any blood or urine tests, imaging tests and EKG's to your doctor and any referral doctor. Have any abnormal tests repeated. Your doctor or a referral doctor can let you know when this should be done. Also make sure your doctor contacts this hospital to get any test results that are not currently available such as cultures or special tests for infection and final imaging reports, which are often not available at the time you leave the ER but which may list additional important findings that are not documented on the preliminary report. BLOOD PRESSURE: If your blood pressure was greater than 120/80 have your blood pressure rechecked within 1 to 2 weeks. MEDICATION SIDE EFFECTS: Do not drive, walk, bike, take the bus, etc. if you have received or are being prescribed  any sedating medications such as those for pain or anxiety or certain antihistamines like Benadryl . If you have been give one of these here get a taxi home or have a friend drive you home. Ask your pharmacist to counsel you on potential side effects of any new medication

## 2024-04-06 NOTE — ED Triage Notes (Signed)
 Says she has felt unwell for about a week or so and today began having difficulty staying awake. She has concern for dehydration or low vitamin D  levels.

## 2024-04-06 NOTE — ED Notes (Addendum)
 PT reports feeling weak for the past few days.  Believes she might be dehydrated.

## 2024-04-09 DIAGNOSIS — L732 Hidradenitis suppurativa: Principal | ICD-10-CM

## 2024-04-09 DIAGNOSIS — L408 Other psoriasis: Secondary | ICD-10-CM

## 2024-04-09 MED ORDER — BIMZELX 320 MG/2 ML SUBCUTANEOUS SYRINGE
SUBCUTANEOUS | 11 refills | 28.00000 days
Start: 2024-04-09 — End: ?
